# Patient Record
Sex: Male | Born: 1960
Health system: Southern US, Community
[De-identification: ages and names within clinical notes are randomized; demographics above are authoritative.]

## PROBLEM LIST (undated history)

## (undated) DIAGNOSIS — E669 Obesity, unspecified: Secondary | ICD-10-CM

## (undated) DIAGNOSIS — K7689 Other specified diseases of liver: Secondary | ICD-10-CM

## (undated) DIAGNOSIS — C189 Malignant neoplasm of colon, unspecified: Secondary | ICD-10-CM

## (undated) DIAGNOSIS — I639 Cerebral infarction, unspecified: Secondary | ICD-10-CM

## (undated) DIAGNOSIS — E781 Pure hyperglyceridemia: Secondary | ICD-10-CM

## (undated) DIAGNOSIS — Z85038 Personal history of other malignant neoplasm of large intestine: Secondary | ICD-10-CM

## (undated) DIAGNOSIS — L4050 Arthropathic psoriasis, unspecified: Secondary | ICD-10-CM

## (undated) DIAGNOSIS — M62830 Muscle spasm of back: Secondary | ICD-10-CM

## (undated) DIAGNOSIS — D649 Anemia, unspecified: Secondary | ICD-10-CM

## (undated) DIAGNOSIS — G4700 Insomnia, unspecified: Secondary | ICD-10-CM

## (undated) DIAGNOSIS — M79674 Pain in right toe(s): Secondary | ICD-10-CM

## (undated) DIAGNOSIS — I771 Stricture of artery: Secondary | ICD-10-CM

## (undated) DIAGNOSIS — I2510 Atherosclerotic heart disease of native coronary artery without angina pectoris: Secondary | ICD-10-CM

## (undated) DIAGNOSIS — N529 Male erectile dysfunction, unspecified: Secondary | ICD-10-CM

## (undated) DIAGNOSIS — I10 Essential (primary) hypertension: Secondary | ICD-10-CM

## (undated) DIAGNOSIS — N644 Mastodynia: Secondary | ICD-10-CM

## (undated) DIAGNOSIS — E349 Endocrine disorder, unspecified: Secondary | ICD-10-CM

## (undated) DIAGNOSIS — M25561 Pain in right knee: Secondary | ICD-10-CM

## (undated) DIAGNOSIS — R002 Palpitations: Secondary | ICD-10-CM

## (undated) DIAGNOSIS — E785 Hyperlipidemia, unspecified: Secondary | ICD-10-CM

## (undated) DIAGNOSIS — F418 Other specified anxiety disorders: Secondary | ICD-10-CM

## (undated) DIAGNOSIS — E1169 Type 2 diabetes mellitus with other specified complication: Secondary | ICD-10-CM

## (undated) DIAGNOSIS — L409 Psoriasis, unspecified: Secondary | ICD-10-CM

## (undated) DIAGNOSIS — E782 Mixed hyperlipidemia: Secondary | ICD-10-CM

## (undated) DIAGNOSIS — K219 Gastro-esophageal reflux disease without esophagitis: Secondary | ICD-10-CM

## (undated) DIAGNOSIS — R7989 Other specified abnormal findings of blood chemistry: Secondary | ICD-10-CM

## (undated) DIAGNOSIS — Z0000 Encounter for general adult medical examination without abnormal findings: Secondary | ICD-10-CM

## (undated) DIAGNOSIS — D509 Iron deficiency anemia, unspecified: Secondary | ICD-10-CM

## (undated) HISTORY — DX: Essential (primary) hypertension: I10

## (undated) HISTORY — DX: Gastro-esophageal reflux disease without esophagitis: K21.9

## (undated) HISTORY — DX: Iron deficiency anemia, unspecified: D50.9

## (undated) HISTORY — PX: COLONOSCOPY: SHX174

## (undated) HISTORY — DX: Pain in right toe(s): M79.674

## (undated) HISTORY — DX: Hyperlipidemia, unspecified: E78.5

## (undated) HISTORY — DX: Other specified anxiety disorders: F41.8

## (undated) HISTORY — PX: CARPAL TUNNEL RELEASE: SHX101

## (undated) HISTORY — DX: Mastodynia: N64.4

## (undated) HISTORY — DX: Endocrine disorder, unspecified: E34.9

## (undated) HISTORY — DX: Malignant neoplasm of colon, unspecified: C18.9

## (undated) HISTORY — DX: Palpitations: R00.2

## (undated) HISTORY — DX: Other specified abnormal findings of blood chemistry: R79.89

## (undated) HISTORY — DX: Anemia, unspecified: D64.9

## (undated) HISTORY — DX: Insomnia, unspecified: G47.00

## (undated) HISTORY — DX: Psoriasis, unspecified: L40.9

## (undated) HISTORY — DX: Stricture of artery: I77.1

## (undated) HISTORY — DX: Type 2 diabetes mellitus with other specified complication: E11.69

## (undated) HISTORY — DX: Cerebral infarction, unspecified: I63.9

## (undated) HISTORY — DX: Muscle spasm of back: M62.830

## (undated) HISTORY — DX: Other specified diseases of liver: K76.89

## (undated) HISTORY — PX: POLYPECTOMY: SHX149

## (undated) HISTORY — DX: Arthropathic psoriasis, unspecified: L40.50

## (undated) HISTORY — DX: Obesity, unspecified: E66.9

## (undated) HISTORY — DX: Male erectile dysfunction, unspecified: N52.9

## (undated) HISTORY — PX: HEMICOLECTOMY: SHX854

## (undated) HISTORY — DX: Pain in right knee: M25.561

## (undated) HISTORY — DX: Pure hyperglyceridemia: E78.1

## (undated) HISTORY — DX: Encounter for general adult medical examination without abnormal findings: Z00.00

## (undated) HISTORY — DX: Mixed hyperlipidemia: E78.2

## (undated) HISTORY — DX: Personal history of other malignant neoplasm of large intestine: Z85.038

---

## 1992-07-19 HISTORY — PX: CHOLECYSTECTOMY: SHX55

## 2001-09-13 ENCOUNTER — Encounter (INDEPENDENT_AMBULATORY_CARE_PROVIDER_SITE_OTHER): Payer: Self-pay | Admitting: *Deleted

## 2001-09-13 LAB — HM COLONOSCOPY: HM Colonoscopy: NORMAL

## 2008-02-16 ENCOUNTER — Ambulatory Visit: Payer: Self-pay | Admitting: Specialist

## 2008-02-29 ENCOUNTER — Ambulatory Visit: Payer: Self-pay | Admitting: Specialist

## 2008-03-29 ENCOUNTER — Encounter: Payer: Self-pay | Admitting: Internal Medicine

## 2008-12-03 ENCOUNTER — Encounter: Payer: Self-pay | Admitting: Internal Medicine

## 2009-07-19 DIAGNOSIS — D509 Iron deficiency anemia, unspecified: Secondary | ICD-10-CM

## 2009-07-19 HISTORY — DX: Iron deficiency anemia, unspecified: D50.9

## 2009-09-25 ENCOUNTER — Encounter: Payer: Self-pay | Admitting: Internal Medicine

## 2009-10-03 ENCOUNTER — Telehealth (INDEPENDENT_AMBULATORY_CARE_PROVIDER_SITE_OTHER): Payer: Self-pay | Admitting: *Deleted

## 2009-10-03 ENCOUNTER — Ambulatory Visit (HOSPITAL_COMMUNITY): Admission: RE | Admit: 2009-10-03 | Discharge: 2009-10-03 | Payer: Self-pay | Admitting: Internal Medicine

## 2009-10-03 ENCOUNTER — Ambulatory Visit: Payer: Self-pay | Admitting: Internal Medicine

## 2009-10-03 DIAGNOSIS — E669 Obesity, unspecified: Secondary | ICD-10-CM | POA: Insufficient documentation

## 2009-10-03 DIAGNOSIS — L409 Psoriasis, unspecified: Secondary | ICD-10-CM | POA: Insufficient documentation

## 2009-10-03 DIAGNOSIS — D649 Anemia, unspecified: Secondary | ICD-10-CM

## 2009-10-03 HISTORY — DX: Anemia, unspecified: D64.9

## 2009-10-03 LAB — CONVERTED CEMR LAB
ALT: 46 units/L (ref 0–53)
AST: 31 units/L (ref 0–37)
BUN: 13 mg/dL (ref 6–23)
Basophils Absolute: 0.1 10*3/uL (ref 0.0–0.1)
Basophils Relative: 2 % — ABNORMAL HIGH (ref 0–1)
Chloride: 103 meq/L (ref 96–112)
Creatinine, Ser: 0.99 mg/dL (ref 0.40–1.50)
Eosinophils Relative: 3 % (ref 0–5)
Glucose, Bld: 136 mg/dL — ABNORMAL HIGH (ref 70–99)
HCT: 29.3 % — ABNORMAL LOW (ref 39.0–52.0)
Hemoglobin: 8.5 g/dL — ABNORMAL LOW (ref 13.0–17.0)
Hgb A1c MFr Bld: 7.9 % — ABNORMAL HIGH (ref 4.6–6.1)
Iron: 21 ug/dL — ABNORMAL LOW (ref 42–165)
Lymphocytes Relative: 41 % (ref 12–46)
Lymphs Abs: 3 10*3/uL (ref 0.7–4.0)
Monocytes Absolute: 0.8 10*3/uL (ref 0.1–1.0)
Potassium: 4.6 meq/L (ref 3.5–5.3)
Saturation Ratios: 4 % — ABNORMAL LOW (ref 20–55)
TIBC: 528 ug/dL — ABNORMAL HIGH (ref 215–435)
TSH: 3.062 microintl units/mL (ref 0.350–4.500)
Total Bilirubin: 0.7 mg/dL (ref 0.3–1.2)

## 2009-10-05 ENCOUNTER — Telehealth: Payer: Self-pay | Admitting: Family Medicine

## 2009-10-05 ENCOUNTER — Emergency Department: Payer: Self-pay | Admitting: Internal Medicine

## 2009-10-05 ENCOUNTER — Inpatient Hospital Stay (HOSPITAL_COMMUNITY): Admission: EM | Admit: 2009-10-05 | Discharge: 2009-10-07 | Payer: Self-pay | Admitting: Internal Medicine

## 2009-10-06 ENCOUNTER — Ambulatory Visit: Payer: Self-pay | Admitting: Internal Medicine

## 2009-10-06 ENCOUNTER — Telehealth: Payer: Self-pay | Admitting: Internal Medicine

## 2009-10-07 ENCOUNTER — Encounter (INDEPENDENT_AMBULATORY_CARE_PROVIDER_SITE_OTHER): Payer: Self-pay | Admitting: *Deleted

## 2009-10-09 ENCOUNTER — Encounter: Payer: Self-pay | Admitting: Internal Medicine

## 2009-11-04 ENCOUNTER — Ambulatory Visit: Payer: Self-pay | Admitting: Internal Medicine

## 2009-11-04 DIAGNOSIS — K7689 Other specified diseases of liver: Secondary | ICD-10-CM

## 2009-11-04 DIAGNOSIS — K219 Gastro-esophageal reflux disease without esophagitis: Secondary | ICD-10-CM | POA: Insufficient documentation

## 2009-11-04 HISTORY — DX: Gastro-esophageal reflux disease without esophagitis: K21.9

## 2009-11-04 HISTORY — DX: Other specified diseases of liver: K76.89

## 2009-11-05 LAB — CONVERTED CEMR LAB
Basophils Relative: 0.9 % (ref 0.0–3.0)
Eosinophils Absolute: 0.2 10*3/uL (ref 0.0–0.7)
Eosinophils Relative: 2.9 % (ref 0.0–5.0)
Hemoglobin: 11.1 g/dL — ABNORMAL LOW (ref 13.0–17.0)
MCV: 75.7 fL — ABNORMAL LOW (ref 78.0–100.0)
Neutro Abs: 4.1 10*3/uL (ref 1.4–7.7)
RDW: 25.9 % — ABNORMAL HIGH (ref 11.5–14.6)

## 2009-12-03 ENCOUNTER — Ambulatory Visit: Payer: Self-pay | Admitting: Internal Medicine

## 2009-12-04 ENCOUNTER — Telehealth: Payer: Self-pay | Admitting: Internal Medicine

## 2009-12-05 ENCOUNTER — Encounter: Payer: Self-pay | Admitting: Internal Medicine

## 2009-12-08 ENCOUNTER — Telehealth (INDEPENDENT_AMBULATORY_CARE_PROVIDER_SITE_OTHER): Payer: Self-pay | Admitting: *Deleted

## 2009-12-12 ENCOUNTER — Encounter (INDEPENDENT_AMBULATORY_CARE_PROVIDER_SITE_OTHER): Payer: Self-pay | Admitting: *Deleted

## 2009-12-12 ENCOUNTER — Encounter (INDEPENDENT_AMBULATORY_CARE_PROVIDER_SITE_OTHER): Payer: Self-pay | Admitting: Surgery

## 2009-12-12 ENCOUNTER — Inpatient Hospital Stay (HOSPITAL_COMMUNITY)
Admission: RE | Admit: 2009-12-12 | Discharge: 2009-12-17 | Payer: Self-pay | Source: Home / Self Care | Admitting: Surgery

## 2009-12-25 ENCOUNTER — Ambulatory Visit: Payer: Self-pay | Admitting: Oncology

## 2009-12-25 ENCOUNTER — Encounter: Payer: Self-pay | Admitting: Internal Medicine

## 2010-01-02 ENCOUNTER — Telehealth: Payer: Self-pay | Admitting: Internal Medicine

## 2010-01-02 ENCOUNTER — Encounter: Payer: Self-pay | Admitting: Internal Medicine

## 2010-01-02 DIAGNOSIS — C189 Malignant neoplasm of colon, unspecified: Secondary | ICD-10-CM

## 2010-01-02 DIAGNOSIS — Z85038 Personal history of other malignant neoplasm of large intestine: Secondary | ICD-10-CM

## 2010-01-02 HISTORY — DX: Malignant neoplasm of colon, unspecified: C18.9

## 2010-01-02 HISTORY — DX: Personal history of other malignant neoplasm of large intestine: Z85.038

## 2010-01-02 LAB — CONVERTED CEMR LAB
Basophils Absolute: 0.1 10*3/uL (ref 0.0–0.1)
Basophils Relative: 1 % (ref 0–1)
Eosinophils Absolute: 1.1 10*3/uL — ABNORMAL HIGH (ref 0.0–0.7)
Eosinophils Relative: 13 % — ABNORMAL HIGH (ref 0–5)
MCHC: 30.8 g/dL (ref 30.0–36.0)
Monocytes Absolute: 0.9 10*3/uL (ref 0.1–1.0)
Neutrophils Relative %: 45 % (ref 43–77)
Platelets: 537 10*3/uL — ABNORMAL HIGH (ref 150–400)
RBC: 4.44 M/uL (ref 4.22–5.81)
RDW: 15.6 % — ABNORMAL HIGH (ref 11.5–15.5)
WBC: 8.4 10*3/uL (ref 4.0–10.5)

## 2010-01-05 ENCOUNTER — Telehealth: Payer: Self-pay | Admitting: Internal Medicine

## 2010-01-05 ENCOUNTER — Encounter: Payer: Self-pay | Admitting: Internal Medicine

## 2010-01-07 ENCOUNTER — Encounter: Payer: Self-pay | Admitting: Internal Medicine

## 2010-01-16 ENCOUNTER — Encounter: Payer: Self-pay | Admitting: Internal Medicine

## 2010-03-20 ENCOUNTER — Ambulatory Visit: Payer: Self-pay | Admitting: Internal Medicine

## 2010-03-20 DIAGNOSIS — G4700 Insomnia, unspecified: Secondary | ICD-10-CM | POA: Insufficient documentation

## 2010-03-20 DIAGNOSIS — E1169 Type 2 diabetes mellitus with other specified complication: Secondary | ICD-10-CM | POA: Insufficient documentation

## 2010-03-20 DIAGNOSIS — E669 Obesity, unspecified: Secondary | ICD-10-CM

## 2010-03-20 HISTORY — DX: Type 2 diabetes mellitus with other specified complication: E11.69

## 2010-03-20 HISTORY — DX: Type 2 diabetes mellitus with other specified complication: E66.9

## 2010-03-20 HISTORY — DX: Insomnia, unspecified: G47.00

## 2010-03-20 LAB — CONVERTED CEMR LAB: Blood Glucose, Fingerstick: 141

## 2010-03-24 ENCOUNTER — Encounter: Payer: Self-pay | Admitting: Internal Medicine

## 2010-03-24 LAB — CONVERTED CEMR LAB
Hgb A1c MFr Bld: 7.1 % — ABNORMAL HIGH (ref <5.7)
MCHC: 32.2 g/dL (ref 30.0–36.0)
MCV: 81.6 fL (ref 78.0–100.0)
RBC: 4.88 M/uL (ref 4.22–5.81)
WBC: 6 10*3/uL (ref 4.0–10.5)

## 2010-03-25 ENCOUNTER — Telehealth: Payer: Self-pay | Admitting: Internal Medicine

## 2010-08-18 NOTE — Progress Notes (Signed)
Summary: surgery  Phone Note Call from Patient Call back at [phone]   Caller: girlfriend Call For: Dr. Marina Goodell Reason for Call: Talk to Nurse Summary of Call: girlfriend would like the very first available surgeon appt for pt and would also like the appt late in the day... doesnt care which surgeon  Initial call taken by: Vallarie Mare,  Dec 04, 2009 8:07 AM  Follow-up for Phone Call        Pt's girlfriend [name]-[phone]) ntfd. that pt. has appt.  with Dr.Gross from [facility]. at the [place] office on Wed.-12/10/09 at 2  p.m. and he needs to be there at 1:30 p.m.  Follow-up by: Teryl Lucy RN,  Dec 04, 2009 1:41 PM

## 2010-08-18 NOTE — Progress Notes (Signed)
  Phone Note From Other Clinic   Caller: [facility] ED Summary of Call: Patient with recently dxed anemia.  Presented to [facility] after large volume bowel movement with blood.  Crampy abd pain and hgb 9.0 but patient hemodynamically stable.  Pt requesting transfer to [facility].  I have recommended that [facility] be contacted to accept transfer.   Initial call taken by: Evelena Peat MD,  October 05, 2009 2:48 PM

## 2010-08-18 NOTE — Consult Note (Signed)
Summary: [facility]  [facility]   Imported By: Lanelle Bal 10/15/2009 09:22:17  _____________________________________________________________________  External Attachment:    Type:   Image     Comment:   External Document

## 2010-08-18 NOTE — Letter (Signed)
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   If you need additional information, please feel free to contact our office.       Sincerely,    Mervin Kung CMA, AAMA Dr. Thomos Lemons

## 2010-08-18 NOTE — Progress Notes (Signed)
Summary: SURGERY SCHEDULED  Phone Note Other Incoming   Summary of Call: Girlfriend called to say [facility] moved pt's appt. to 12/08/09 with Dr.Rosenbower.Path. report faxed to his office.  Follow-up for Phone Call        Bernie at [facility] says pt. saw Dr.Gross at [facility] and he is scheduled for surgery with Dr.Wakefield  and Dr.Tseui. Follow-up by: Teryl Lucy RN,  Dec 08, 2009 2:42 PM

## 2010-08-18 NOTE — Letter (Signed)
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         Sincerely,    Glendell Docker CMA Dr Thomos Lemons

## 2010-08-18 NOTE — Letter (Signed)
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  If you need additional information, please feel free to contact our office.         Sincerely,    Mervin Kung CMA Thomos Lemons, DO

## 2010-08-18 NOTE — Consult Note (Signed)
Summary: [facility]  [facility]   Imported By: Lanelle Bal 01/14/2010 08:27:53  _____________________________________________________________________  External Attachment:    Type:   Image     Comment:   External Document

## 2010-08-18 NOTE — Progress Notes (Signed)
Summary: in  the hospital   Phone Note Call from Patient   Caller: patient girlfriend tony Call For: yoo  Summary of Call: patient has been admitted to [facility].  Had a ct that showed apparent inflammatory changes involving the right proximal colon suggested of focal colitis or right sided diverticulitis.  He has hepatic steaosis and lobulated soft tissue nodule in the omentum. He is concerned as he has only seen a hospital doctor and has not had a GI referral called in as yet.  He was to see GI at their office today but since he is in the hospital that appt was cancelled.  Anything you can do to get a GI consult for him at [facility].  [phone]    Initial call taken by: Roselle Locus,  October 06, 2009 2:44 PM  Follow-up for Phone Call        that is for hospital doctor to decide.  If he is not seen by GI in the hospital,  we  will arrange GI consult as out pt.  hospitalist note mentioned they consulted GI.   Please call [facility] and find out who is on call Follow-up by: D. Thomos Lemons DO,  October 06, 2009 4:30 PM  Additional Follow-up for Phone Call Additional follow up Details #1::        spoke with Neysa Bonito at [facility], she states Maralyn Sago is covering [facility]. Pager  [phone] Additional Follow-up by: Glendell Docker CMA,  October 06, 2009 4:43 PM    Additional Follow-up for Phone Call Additional follow up Details #2::    Spoke with Maralyn Sago she states patient was seen for consult about an hour ago Follow-up by: Glendell Docker CMA,  October 06, 2009 4:54 PM

## 2010-08-18 NOTE — Letter (Signed)
Summary: [facility]   Imported By: Sherian Rein 01/08/2010 08:49:30  _____________________________________________________________________  External Attachment:    Type:   Image     Comment:   External Document

## 2010-08-18 NOTE — Letter (Signed)
Summary: New Patient letter  [facility]  [address] [place], [place] [address]   Phone: [phone]  Fax: [phone]       10/07/2009 MRN: [id number]  [name] [address] [place], [place]  [address]  Dear Mr. Carachure,  Welcome to the Gastroenterology Division at [facility].    You are scheduled to see Dr.  Yancey Flemings on November 04, 2009 at 3:30pm on the 3rd floor at [facility], [address]  We ask that you try to arrive at our office 15 minutes prior to your appointment time to allow for check-in.  We would like you to complete the enclosed self-administered evaluation form prior to your visit and bring it with you on the day of your appointment.  We will review it with you.  Also, please bring a complete list of all your medications or, if you prefer, bring the medication bottles and we will list them.  Please bring your insurance card so that we may make a copy of it.  If your insurance requires a referral to see a specialist, please bring your referral form from your primary care physician.  Co-payments are due at the time of your visit and may be paid by cash, check or credit card.     Your office visit will consist of a consult with your physician (includes a physical exam), any laboratory testing he/she may order, scheduling of any necessary diagnostic testing (e.g. x-ray, ultrasound, CT-scan), and scheduling of a procedure (e.g. Endoscopy, Colonoscopy) if required.  Please allow enough time on your schedule to allow for any/all of these possibilities.    If you cannot keep your appointment, please call [phone] to cancel or reschedule prior to your appointment date.  This allows [place] the opportunity to schedule an appointment for another patient in need of care.  If you do not cancel or reschedule by 5 p.m. the business day prior to your appointment date, you will be charged a $50.00 late cancellation/no-show fee.    Thank you for  choosing [facility] for your medical needs.  We appreciate the opportunity to care for you.  Please visit [place] at our website  to learn more about our practice.                     Sincerely,                                                             The Gastroenterology Division

## 2010-08-18 NOTE — Procedures (Signed)
Summary: Upper Endoscopy  Patient: [name] Note: All result statuses are Final unless otherwise noted.  Tests: (1) Upper Endoscopy (EGD)   EGD Upper Endoscopy       DONE     [facility]     [address]     [place], [place]  [address]           ENDOSCOPY PROCEDURE REPORT           PATIENT:  Frank, Duke  MR#:  [id number]     BIRTHDATE:  02-Nov-1960, 49 yrs. old  GENDER:  male           ENDOSCOPIST:  Wilhemina Bonito. Eda Keys, MD     Referred by:  Thomos Lemons, DO           PROCEDURE DATE:  12/03/2009     PROCEDURE:  EGD, diagnostic     ASA CLASS:  Class II     INDICATIONS:  GERD, iron deficiency anemia           MEDICATIONS:   There was residual sedation effect present from     prior procedure., Versed 1 mg IV     TOPICAL ANESTHETIC:  Exactacain Spray           DESCRIPTION OF PROCEDURE:   After the risks benefits and     alternatives of the procedure were thoroughly explained, informed     consent was obtained.  The LB GIF-H180 [id number] endoscope was     introduced through the mouth and advanced to the second portion of     the duodenum, without limitations.  The instrument was slowly     withdrawn as the mucosa was fully examined.     <<PROCEDUREIMAGES>>           Reflux Esophagitis was found in the distal esophagus with     stricture formation as well. No Barrett's.  Otherwise the     examination was normal to D2.    Retroflexed views revealed a     hiatal hernia.    The scope was then withdrawn from the patient     and the procedure completed.           COMPLICATIONS:  None           ENDOSCOPIC IMPRESSION:     1) Esophagitis in the distal esophagus     2) Stricture in the distal esophagus     3) Otherwise normal examination     4) A hiatal hernia     5) Gerd           RECOMMENDATIONS:     1) continue Omeprazole twice daily     2) Reflux precautions     3) Call if swallowing problems develop           ______________________________     Wilhemina Bonito. Eda Keys, MD           CC:  Thomos Lemons, DO, The Patient           n.     eSIGNED:   Wilhemina Bonito. Eda Keys at 12/03/2009 04:47 PM           Carin Hock, [id number]  Note: An exclamation mark (!) indicates a result that was not dispersed into the flowsheet. Document Creation Date: 12/04/2009 5:07 PM _______________________________________________________________________  (1) Order result status: Final Collection or observation date-time: 12/03/2009 16:41 Requested date-time:  Receipt date-time:  Reported date-time:  Referring Physician:  Ordering Physician: Fransico Setters [phone]) Specimen Source:  Source: Launa Grill Order Number: [id number] Lab site:

## 2010-08-18 NOTE — Assessment & Plan Note (Signed)
Summary: Iron deficiency anemia / recent hospitalization   History of Present Illness Visit Type: Initial Visit Primary GI MD: Yancey Flemings MD Primary Provider: Dondra Spry DO Requesting Provider: n/a Chief Complaint: Anemia, also c/o abdominal pain History of Present Illness:   50 year old white male with obesity, psoriasis, GERD, and iron deficiency anemia. Patient relocated to [facility] from [place]. He recently established with Dr. Thomos Lemons and was found to have iron deficiency anemia and Hemoccult-positive stool. His hemoglobin at that time was 8.5. He was placed on iron and outpatient GI consultation arranged. However, prior to his GI appointment, he was admitted to the hospital with acute abdominal pain and rectal bleeding. CT Scan of the abdomen and pelvis revealed inflammatory changes involving the proximal transverse colon and right colon. He was seen in consultation by GI and felt to have ischemic colitis, though atypical distribution. As well, hepatic steatosis on CT. He was treated conservatively and discharged home. He has been maintained on iron therapy and returns for outpatient followup. Overall he is doing well. Improved energy level. Some vague left upper quadrant abdominal discomfort, but otherwise okay. Bowel habits okay. No gross bleeding. He is anxious. No follow up blood work since his hospital stay. Denies NSAIDs   GI Review of Systems    Reports abdominal pain.      Denies acid reflux, belching, bloating, chest pain, dysphagia with liquids, dysphagia with solids, heartburn, loss of appetite, nausea, vomiting, vomiting blood, weight loss, and  weight gain.      Reports rectal bleeding.     Denies anal fissure, black tarry stools, change in bowel habit, constipation, diarrhea, diverticulosis, fecal incontinence, heme positive stool, hemorrhoids, irritable bowel syndrome, jaundice, light color stool, liver problems, and  rectal pain. Preventive  Screening-Counseling & Management      Drug Use:  no.      Current Medications (verified): 1)  Enbrel 50 Mg/ml Soln (Etanercept) .... Inject Im Twice Per Week 2)  Clobetasol Propionate 0.05 % Crea (Clobetasol Propionate) .... Apply Topically Once Daily As Needed 3)  Omeprazole 40 Mg Cpdr (Omeprazole) .... One By Mouth Two Times A Day 15 Minutes Before Meal 4)  Ferrex 150 Forte 150-25-1 Mg-Mcg-Mg Caps (Iron Polysacch Cmplx-B12-Fa) .... One By Mouth Once Daily  Allergies (verified): No Known Drug Allergies  Past History:  Past Medical History: Obesity Anemia  2011  Psoriasis Hepatic Steatosis  Past Surgical History: Cholecystectomy  1994  Family History: Reviewed history from 10/03/2009 and no changes required. mother died of cancer in her 67's- stomach cancer  No CAD older brother has borderline DM no prostate no colon cancer   Social History: Reviewed history from 10/03/2009 and no changes required. Occupation: Pensions consultant  Single   no children former smoker Illicit Drug Use - no Drug Use:  no  Review of Systems       The patient complains of fatigue and shortness of breath.  The patient denies allergy/sinus, anemia, anxiety-new, arthritis/joint pain, back pain, blood in urine, breast changes/lumps, change in vision, confusion, cough, coughing up blood, depression-new, fainting, fever, headaches-new, hearing problems, heart murmur, heart rhythm changes, itching, muscle pains/cramps, night sweats, nosebleeds, skin rash, sleeping problems, sore throat, swelling of feet/legs, swollen lymph glands, thirst - excessive, urination - excessive, urination changes/pain, urine leakage, vision changes, and voice change.    Vital Signs:  Patient profile:   50 year old male Height:      69 inches Weight:  244 pounds BMI:     36.16 BSA:     2.25 Pulse rate:   100 / minute Pulse rhythm:   regular BP sitting:   138 / 82  (left arm)  Vitals Entered By: Merri Ray CMA  Duncan Dull) (November 04, 2009 3:31 PM)  Physical Exam  General:  Well developed, well nourished, no acute distress. Head:  Normocephalic and atraumatic. Eyes:  PERRLA, no icterus. Ears:  Normal auditory acuity. Nose:  No deformity, discharge,  or lesions. Mouth:  No deformity or lesions, dentition normal. Neck:  Supple; no masses or thyromegaly. Lungs:  Clear throughout to auscultation. Heart:  Regular rate and rhythm; no murmurs, rubs,  or bruits. Abdomen:  Soft, nontender and nondistended. No masses, hepatosplenomegaly or hernias noted. Normal bowel sounds. Rectal:  deferred until colonoscopy Msk:  Symmetrical with no gross deformities. Normal posture. Pulses:  Normal pulses noted. Extremities:  No clubbing, cyanosis, edema or deformities noted. Neurologic:  Alert and  oriented x4;  grossly normal neurologically. Skin:  psoriatic plaques on the elbows, abdomen, and knees  Psych:  Alert and cooperative. Normal mood and affect.   Impression & Recommendations:  Problem # 1:  ANEMIA-IRON DEFICIENCY (ICD-280.9) iron deficiency anemia diagnosed several months ago. As well Hemoccult-positive stool. Rule out occult GI lesion.  Plan: #1. Colonoscopy. The nature of the procedure as well as the risks, benefits, and alternatives were reviewed in detail. He understood and agreed to proceed #2. Upper endoscopy. The nature of the procedure as well as the risks, benefits, and alternatives were reviewed. He understood and agreed to proceed #3. Movi prep prescribed. The patient instructed on its use. #4. Obtain followup CBC today #5. Continue iron therapy  Problem # 2:  FECAL OCCULT BLOOD (ICD-792.1) rule out significant mucosal lesion, particularly given iron deficiency. Workup as planned above  Problem # 3:  NONSPECIFIC ABN FINDING RAD & OTH EXAM GI TRACT (ICD-793.4) CT scan suggesting right-sided colitis, likely ischemic.  Plan: #1. Clarify with colonoscopy  Problem # 4:  FATTY LIVER DISEASE  (ICD-571.8) fatty liver disease on CT  Plan: #1 sensible weight loss #2 sensible exercise  Problem # 5:  GERD (ICD-530.81) history of GERD. Symptoms controlled with PPI   Plan  #1 continue omeprazole #2. Reflux precautions with attention to weight loss  Other Orders: Colon/Endo (Colon/Endo) TLB-CBC Platelet - w/Differential (85025-CBCD)  Patient Instructions: 1)  Labs ordered for patient to have drawn today. 2)  Endo/Colon [facility] 12/03/09 3:30 pm arrive at 2:30 pm 3)  Movi prep instructions given to patient. 4)  Movi prep Rx. sent to your pharmacy to pick up. 5)  The medication list was reviewed and reconciled.  All changed / newly prescribed medications were explained.  A complete medication list was provided to the patient / caregiver. 6)  Colonoscopy and Flexible Sigmoidoscopy brochure given.  7)  Upper Endoscopy brochure given.  8)  printed and given to patient. Milford Cage [facility]  November 04, 2009 4:32 PM 9)  copy: Dr. Thomos Lemons Prescriptions: MOVIPREP 100 GM  SOLR (PEG-KCL-NACL-NASULF-NA ASC-C) As per prep instructions.  #1 x 0   Entered by:   Milford Cage NCMA   Authorized by:   Hilarie Fredrickson MD   Signed by:   Milford Cage NCMA on 11/04/2009   Method used:   Electronically to        [facility]. [address] [phone]* (retail)       [address]  [place], [place]  [address]       Ph: [phone]       Fax: [phone]   RxID:   [id number]

## 2010-08-18 NOTE — Letter (Signed)
Summary: Screening Form/[facility]  Screening Form/[facility]   Imported By: Sherian Rein 04/01/2010 13:23:30  _____________________________________________________________________  External Attachment:    Type:   Image     Comment:   External Document

## 2010-08-18 NOTE — Progress Notes (Signed)
Summary: work restriction  Phone Note Call from Patient Call back at [phone]   Caller: Patient Call For: D. Thomos Lemons DO Summary of Call: Pt called stating that his company has a guideline that if a pt. can't lift at least 25 pounds they cannot work.  Pt is requesting a new letter stating that he can lift no more than 30 pounds when he returns to work on 01/21/10. If you feel pt is unable to lift this amount he will need a letter to the [facility] (short term disability) group explaining why pt will not be able to return to work on the expected date.  Pt is aware that it will be Monday before we will have any information for him concerning this request.  Mervin Kung CMA  January 02, 2010 11:57 AM   Follow-up for Phone Call        ok to change letter to no lifting > 30 lbs Follow-up by: D. Thomos Lemons DO,  January 02, 2010 1:03 PM  Additional Follow-up for Phone Call Additional follow up Details #1::        Left message on machine to return my call.  Corrected letter has been placed at the front desk for pick up. Mervin Kung CMA  January 02, 2010 2:07 PM

## 2010-08-18 NOTE — Progress Notes (Signed)
Summary: Appt. ASAP  Phone Note From Other Clinic   Caller: [facility] [phone] Summary of Call: Needs an appt. ASAP. Pt is going for iron infusion today due to iron def. Initial call taken by: Karna Christmas,  October 03, 2009 11:45 AM  Follow-up for Phone Call        Pt. will see Dr.Kaplan on 10-06-09 at 2:30pm. Myriam Jacobson will advise pt. of appt/medlist/co-pay. Follow-up by: Laureen Ochs LPN,  October 03, 2009 11:54 AM

## 2010-08-18 NOTE — Progress Notes (Signed)
Summary: Lab Results  Phone Note Outgoing Call   Summary of Call: call pt - blood work shows pt still anemic.  advise pt to keep taking iron supplement.  I suggest repeat CBCD in 2 months. Initial call taken by: D. Thomos Lemons DO,  January 05, 2010 12:18 PM  Follow-up for Phone Call        Pt would like to also get his work note changed from 10 pounds to 30. Pls call him at [phone] Diane Tomerlin  January 05, 2010 3:59 PM  Additional Follow-up for Phone Call Additional follow up Details #1::        patient advised of lab results, and a copy of his work note from 6/17 has been reprinted and left at front desk for patient pick up. patient wil return in August for blood work in [employer] Additional Follow-up by: Glendell Docker CMA,  January 05, 2010 5:24 PM

## 2010-08-18 NOTE — Letter (Signed)
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          Sincerely,    Glendell Docker CMA Dr. Thomos Lemons

## 2010-08-18 NOTE — Progress Notes (Signed)
Summary: Lab results  Phone Note Outgoing Call   Summary of Call: call pt - blood test confirms pt is diabetic.  A1c 7.1.   I suggest pt start diet and exercise for next 2-3 months. exercise 30-45 mins daily.  limit carbs to 25 grams per meal.  I also recommend using metformin.  see rx anemia is better -  hemoglobin in 12.8 cholesterol panel shows elevated triglycerides.  this is likely assoc with diabetes and should improve with diet and exercise Pt needs f/u appt within 2 months.  arrange FLP, A1c and BMET before OV  Initial call taken by: D. Thomos Lemons DO,  March 25, 2010 11:43 AM  Follow-up for Phone Call        patient has been advised per Dr Artist Pais instructions. When patient offered a follow up appointment, he states he is unable to schedule because he has no time to take off at work. He will try to schedule around his day off and call to check for availability on  day to day basis Follow-up by: Glendell Docker CMA,  March 26, 2010 1:30 PM    New/Updated Medications: METFORMIN HCL 500 MG TABS (METFORMIN HCL) 1/2 by mouth two times a day with food x 1 week, then one by mouth bid Prescriptions: METFORMIN HCL 500 MG TABS (METFORMIN HCL) 1/2 by mouth two times a day with food x 1 week, then one by mouth bid  #60 x 2   Entered and Authorized by:   D. Thomos Lemons DO   Signed by:   D. Thomos Lemons DO on 03/25/2010   Method used:   Electronically to        [facility]. [address] [phone]* (retail)       [address], [place]  [address]       Ph: [phone]       Fax: [phone]   RxID:   [id number]

## 2010-08-18 NOTE — Letter (Signed)
[facility]   Im[facility] By: Sherian Rein 02/04/2010 12:23:24  _____________________________________________________________________  External Attachment:    Type:   Image     Comment:   External Document

## 2010-08-18 NOTE — Letter (Signed)
[facility]   Imported By: Lanelle Bal 01/22/2010 12:07:00  _____________________________________________________________________  External Attachment:    Type:   Image     Comment:   External Document

## 2010-08-18 NOTE — Consult Note (Signed)
Summary: [facility]   Imported By: Sherian Rein 01/08/2010 08:51:45  _____________________________________________________________________  External Attachment:    Type:   Image     Comment:   External Document

## 2010-08-18 NOTE — Assessment & Plan Note (Signed)
Summary: fu Dr Lamar Sprinkles appt/dt   Vital Signs:  Patient profile:   50 year old male Height:      69 inches Weight:      228 pounds BMI:     33.79 Temp:     97.8 degrees F oral Pulse rate:   88 / minute Pulse rhythm:   regular Resp:     18 per minute BP sitting:   120 / 80  (left arm) Cuff size:   large  Vitals Entered By: Glendell Docker CMA (January 02, 2010 8:19 AM) CC: Rm 2- Follow up   Primary Care Provider:  Dondra Spry DO  CC:  Rm 2- Follow up.  History of Present Illness: 50 y/o white male for f/u microcytic anemia - pt found to have right colon cancer s/p right hemicolectomy healed well.  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  no would deheisence  oncologist -  appt on Monday his psoriasis is worse since holding enbrel he is concerned taking enbrel may have contributed to colon cancer  Preventive Screening-Counseling & Management  Alcohol-Tobacco     Smoking Status: quit  Allergies: No Known Drug Allergies  Past History:  Past Medical History: Obesity Iron def Anemia  2011   Psoriasis Hepatic Steatosis right colon cancer - adenocarcinoma CEA level is normal at 1.8.  Past Surgical History: Cholecystectomy  1994 Right hemicolectomy 08-Jan-2010  Family History: mother died of cancer in her 26's- stomach cancer  No CAD older brother has borderline DM no prostate no colon cancer      Social History: Occupation: Pensions consultant (Conservator, museum/gallery) Single   no children  former smoker Illicit Drug Use - no    Physical Exam  General:  alert, well-developed, and well-nourished.   Head:  normocephalic and atraumatic.   Lungs:  normal respiratory effort and normal breath sounds.   Heart:  normal rate, regular rhythm, and no gallop.   Abdomen:  soft and mild abd tenderness.   abd scar well healed Neurologic:  cranial nerves II-XII  intact and gait normal.     Impression & Recommendations:  Problem # 1:  ADENOCARCINOMA, COLON (ICD-153.9) Pt will check with his oncologist whether he can safely restart Enbrel pt advised to avoid heavy lifting for additional 2-4 weeks pt at risk for incisional hernia  Problem # 2:  ANEMIA-IRON DEFICIENCY (ICD-280.9)  His updated medication list for this problem includes:    Ferrex 150 Forte 150-25-1 Mg-mcg-mg Caps (Iron polysacch cmplx-b12-fa) ..... One by mouth once daily  His updated medication list for this problem includes:    Ferrex 150 Forte 150-25-1 Mg-mcg-mg Caps (Iron polysacch cmplx-b12-fa) ..... One by mouth once daily  Orders: T-CBC w/Diff ([id number])  Complete Medication List: 1)  Enbrel 50 Mg/ml Soln (Etanercept) .... Inject im twice per week (hold) 2)  Clobetasol Propionate 0.05 % Crea (Clobetasol propionate) .... Apply topically once daily as needed 3)  Omeprazole 40 Mg Cpdr (Omeprazole) .... One by mouth two times a day 15 minutes before meal 4)  Ferrex 150 Forte 150-25-1 Mg-mcg-mg Caps (Iron polysacch cmplx-b12-fa) .... One by mouth once daily  Patient Instructions: 1)  Please schedule a follow-up appointment in 2 months.

## 2010-08-18 NOTE — Assessment & Plan Note (Signed)
Summary: paper work/lab per md/lb   Vital Signs:  Patient profile:   50 year old male Height:      69 inches Weight:      235.50 pounds BMI:     34.90 O2 Sat:      98 % on Room air Temp:     98.3 degrees F oral Pulse rate:   86 / minute Pulse rhythm:   regular Resp:     18 per minute BP sitting:   120 / 76  (right arm) Cuff size:   large  Vitals Entered By: Glendell Docker CMA (March 20, 2010 2:08 PM)  O2 Flow:  Room air CC: follow up Is Patient Diabetic? No Pain Assessment Patient in pain? no      CBG Result 141 Comments need paper work completed for work, did not  have CBC drawn   Primary Care Provider:  D. Thomos Lemons DO  CC:  follow up.  History of Present Illness: 50 y/o with of microcystic anemia, colon ca s/p hemicolectomy for f/u overall doing well back to work - busy  denies fatigue no melena  we reviewed prev labs A1c elevated  he denies polyuria or polydysia wife is diabetic.  he follows fairly healthy diet  Preventive Screening-Counseling & Management  Alcohol-Tobacco     Smoking Status: quit  Allergies (verified): No Known Drug Allergies  Past History:  Past Medical History: Obesity Iron def Anemia  2011   Psoriasis  Hepatic Steatosis right colon cancer - adenocarcinoma CEA level is normal at 1.8.  Past Surgical History: Cholecystectomy  1994 Right hemicolectomy 12/17/2009   Family History: mother died of cancer in her 22's- stomach cancer  No CAD older brother has  DM II no prostate no colon cancer      Social History: Occupation: Pensions consultant (Conservator, museum/gallery) Single  (lives with [name]) no children  former smoker Illicit Drug Use - no    Physical Exam  General:  alert, well-developed, and well-nourished.   Lungs:  normal respiratory effort and normal breath sounds.   Abdomen:  soft, non-tender, and normal bowel sounds.   Skin:  turgor normal and color normal.     Impression & Recommendations:  Problem # 1:   HYPERGLYCEMIA (ICD-790.29)  Pt counseled on diet and exercise.  Orders: T- Hemoglobin A1C (04540-98119) T-Lipid Profile (14782-95621) Fingerstick (30865)  His updated medication list for this problem includes:    Metformin Hcl 500 Mg Tabs (Metformin hcl) .Marland Kitchen... 1/2 by mouth two times a day with food x 1 week, then one by mouth bid  Problem # 2:  INSOMNIA, CHRONIC (ICD-307.42) chronic intermittent insomnia.  we discussed dependency issues with sedatives.  trial of low dose amitriptyline  Problem # 3:  ANEMIA, IRON DEFICIENCY, MICROCYTIC (ICD-280.8)  His updated medication list for this problem includes:    Ferrex 150 Forte 150-25-1 Mg-mcg-mg Caps (Iron polysacch cmplx-b12-fa) ..... One by mouth once daily  Orders: T-CBC No Diff ([id number])  Complete Medication List: 1)  Enbrel 50 Mg/ml Soln (Etanercept) .... Inject im twice per week (hold) 2)  Clobetasol Propionate 0.05 % Crea (Clobetasol propionate) .... Apply topically once daily as needed 3)  Omeprazole 40 Mg Cpdr (Omeprazole) .... One by mouth two times a day 15 minutes before meal 4)  Ferrex 150 Forte 150-25-1 Mg-mcg-mg Caps (Iron polysacch cmplx-b12-fa) .... One by mouth once daily 5)  Amitriptyline Hcl 10 Mg Tabs (Amitriptyline hcl) .... One to two tabs by mouth at bedtime as needed  for insomnia 6)  Onetouch Ultra Test Strp (Glucose blood) .... Test once daily 7)  Metformin Hcl 500 Mg Tabs (Metformin hcl) .... 1/2 by mouth two times a day with food x 1 week, then one by mouth bid  Other Orders: Influenza Vaccine NON MCR ([address]) Admin 1st Vaccine ([address])  Patient Instructions: 1)  Please schedule a follow-up appointment in 3 months. 2)  Limit your carbohydrate intake to 25-30 grams per meal. 3)  BMP prior to visit, ICD-9: 790.29 4)  HbgA1C prior to visit, ICD-9: 790.29 5)  Please return for lab work one (1) week before your next appointment.  Prescriptions: OMEPRAZOLE 40 MG CPDR (OMEPRAZOLE) one by mouth two times a  day 15 minutes before meal  #60 x 3   Entered and Authorized by:   D. Thomos Lemons DO   Signed by:   D. Thomos Lemons DO on 03/20/2010   Method used:   Electronically to        [facility]. [address] [phone]* (retail)       [address] [place], [place]  [address]       Ph: [phone]       Fax: [phone]   RxID:   [id number] AMITRIPTYLINE HCL 10 MG TABS (AMITRIPTYLINE HCL) one to two tabs by mouth at bedtime as needed for insomnia  #60 x 2   Entered and Authorized by:   D. Thomos Lemons DO   Signed by:   D. Thomos Lemons DO on 03/20/2010   Method used:   Electronically to        [facility]. [address] [phone]* (retail)       [address] [place], [place]  [address]       Ph: [phone]       Fax: [phone]   RxID:   [id number]   Current Allergies (reviewed today): No known allergies    Immunizations Administered:  Influenza Vaccine # 1:    Vaccine Type: Fluvax Non-MCR    Site: left deltoid    Mfr: GlaxoSmithKline    Dose: 0.5 ml    Route: IM    Given by: Glendell Docker CMA    Exp. Date: 01/16/2011    Lot #: [id number]    VIS given: 02/10/10 version given March 20, 2010.  Flu Vaccine Consent Questions:    Do you have a history of severe allergic reactions to this vaccine? no    Any prior history of allergic reactions to egg and/or gelatin? no    Do you have a sensitivity to the preservative Thimersol? no    Do you have a past history of Guillan-Barre Syndrome? no    Do you currently have an acute febrile illness? no    Have you ever had a severe reaction to latex? no    Vaccine information given and explained to patient? yes   Laboratory Results   Blood Tests     CBG Random:: 141mg /dL

## 2010-08-18 NOTE — Procedures (Signed)
Summary: Colonoscopy  Patient: [name] Note: All result statuses are Final unless otherwise noted.  Tests: (1) Colonoscopy (COL)   COL Colonoscopy           DONE     [facility]     [address]     [place], [place]  [address]           COLONOSCOPY PROCEDURE REPORT           PATIENT:  [name], [name]  MR#:  [id number]     BIRTHDATE:  Jan 25, 1961, 49 yrs. old  GENDER:  male     ENDOSCOPIST:  Wilhemina Bonito. Eda Keys, MD     REF. BY:  Thomos Lemons, DO     PROCEDURE DATE:  12/03/2009     PROCEDURE:  Colonoscopy with biopsy,     Colonoscopy with snare polypectomy     x 2     ASA CLASS:  Class II     INDICATIONS:  Iron Deficiency Anemia, abnormal CT of abdomen, FOBT     positive stool     MEDICATIONS:   Fentanyl 100 mcg IV, Versed 11 mg IV, Benadryl 25     mg IV           DESCRIPTION OF PROCEDURE:   After the risks benefits and     alternatives of the procedure were thoroughly explained, informed     consent was obtained.  Digital rectal exam was performed and     revealed no abnormalities.   The LB [id number] endoscope     was introduced through the anus and advanced to the ascending     colon, limited by an obstruction.    The quality of the prep was     excellent, using MoviPrep.  The instrument was then slowly     withdrawn as the colon was fully examined.     <<PROCEDUREIMAGES>>           FINDINGS:  A subtotally obstructing malignant appearring mass was     found in the ascending colon. The scope could not be passed     proximal. Multiple biopsies taken . Two polyps were found in the     mid transverse colon (10mm pedunculated) and diminutive descending     colon polyp.. Polyps were snared without cautery. Retrieval was     successful.   Mild diverticulosis was found in the sigmoid colon.     Retroflexed views in the rectum revealed no abnormalities.    The     scope was then withdrawn from the patient and the procedure     completed.        COMPLICATIONS:  None     ENDOSCOPIC IMPRESSION:     1) Mass in the ascending colon     2) Two polyps - removed     3) Mild diverticulosis in the sigmoid colon           RECOMMENDATIONS:     1) My office will arrange for you to meet with a surgeon.     2) Follow up colonoscopy in ONE year           ______________________________     Wilhemina Bonito. Eda Keys, MD           CC:  Thomos Lemons, DO; The Patient           n.     eSIGNED:   Teleah Villamar N. Eda Keys at 12/03/2009 04:37 PM  Parks, Czaj[place], [phone]  Note: An exclamation mark (!) indicates a result that was not dispersed into the flowsheet. Document Creation Date: 12/04/2009 5:07 PM _______________________________________________________________________  (1) Order result status: Final Collection or observation date-time: 12/03/2009 16:25 Requested date-time:  Receipt date-time:  Reported date-time:  Referring Physician:   Ordering Physician: Fransico Setters [phone]) Specimen Source:  Source: Launa Grill Order Number: [id number] Lab site:

## 2010-08-18 NOTE — Letter (Signed)
Summary: [facility]   Imported By: Lanelle Bal 01/14/2010 08:27:03  _____________________________________________________________________  External Attachment:    Type:   Image     Comment:   External Document

## 2010-08-18 NOTE — Assessment & Plan Note (Signed)
Summary: NEW TO [facility] PATIENT CALLED AND WAS   Vital Signs:  Patient profile:   50 year old male Height:      69 inches Weight:      239.50 pounds BMI:     35.50 O2 Sat:      98 % on Room air Temp:     98.0 degrees F Pulse rate:   87 / minute Pulse rhythm:   regular Resp:     20 per minute BP sitting:   120 / 78  (right arm) Cuff size:   large  Vitals Entered By: Glendell Docker CMA (October 03, 2009 10:05 AM)  O2 Flow:  Room air CC: New Patient   Primary Care Provider:  Dondra Spry DO  CC:  New Patient.  History of Present Illness: 50 y/o white male to establish.   He was seen at urgent care for paronychia.  Incidentally, pt found to have significant microcytic anemia.    he has associated fatigue and dyspnea on exertion.  He recalls going on a hike in November getting  very winded.    He notes tongue has been red and irritated for last 8 months.  he denies chronic NSAID or aspirin use.   he has not noticed melena or hematochezia he denies chronic abd pain  Preventive Screening-Counseling & Management  Alcohol-Tobacco     Alcohol drinks/day: 0     Smoking Status: quit     Packs/Day: <0.25     Year Started: 1982     Year Quit: 2009  Caffeine-Diet-Exercise     Caffeine use/day: None     Does Patient Exercise: no  Allergies (verified): No Known Drug Allergies  Past History:  Past Medical History: Obesity  Family History: mother died of cancer in her 13's- stomach cancer  No CAD older brother has borderline DM no prostate no colon cancer   Social History: Occupation: Pensions consultant Single   no children former smokerSmoking Status:  quit Packs/Day:  <0.25 Caffeine use/day:  None Does Patient Exercise:  no  Review of Systems       The patient complains of dyspnea on exertion.  The patient denies anorexia, weight loss, weight gain, prolonged cough, abdominal pain, melena, hematochezia, severe indigestion/heartburn, and depression.    Physical  Exam  General:  alert and overweight-appearing.   Head:  normocephalic and atraumatic.   Eyes:  pupils equal, pupils round, and pupils reactive to light.   Ears:  R ear normal and L ear normal.   Mouth:  tongue is slightly enlarged,  "beefy" in appearance Neck:  No deformities, masses, or tenderness noted.no carotid bruits.   Lungs:  normal respiratory effort, normal breath sounds, no crackles, and no wheezes.   Heart:  normal rate, regular rhythm, no murmur, and no gallop.   Abdomen:  soft, non-tender, normal bowel sounds, no masses, no hepatomegaly, and no splenomegaly.   Rectal:  normal sphincter tone and stool positive for occult blood.   Extremities:  No lower extremity edema  Neurologic:  cranial nerves II-XII intact and gait normal.   Skin:  plaques on forearms   Impression & Recommendations:  Problem # 1:  ANEMIA, IRON DEFICIENCY, MICROCYTIC (ICD-280.8) 50 y/o white male with significant iron def anemia.  I suspect slow GI loss for several months.  Check iron stores.  Give IV iron and start oral iron supplementation.   refer to GI for EGD and colonoscopy start nexium b.i.d.  Her updated medication list for this problem  includes:    Ferrex 150 Forte 150-25-1 Mg-mcg-mg Caps (Iron polysacch cmplx-b12-fa) ..... One by mouth once daily  Orders: T-Basic Metabolic Panel [phone]) T-CBC w/Diff [phone]) T-Ferritin ([phone]) Augusto Gamble [phone]) T-Iron Binding Capacity (TIBC) ([address]) Hematology Referral (Hematology) Gastroenterology Referral (GI)  Problem # 2:  PSORIASIS (ICD-696.1) Hold enbrel in case it is contributing to anemia (myelosuppression) Orders: T-Hepatic Function ([address]75-64332)  Problem # 3:  OBESITY (ICD-278.00) screen for diabetes.  Orders: T- Hemoglobin A1C (95188-41660) T-TSH (63[address]-01093)  Complete Medication List: 1)  Enbrel 50 Mg/ml Soln (Etanercept) .... Inject im twice per week 2)  Clobetasol Propionate 0.05 % Crea (Clobetasol  propionate) .... Apply topically once daily as needed 3)  Bactrim Ds 800-160 Mg Tabs (Sulfamethoxazole-trimethoprim) .... Take 1 tablet by mouth two times a day for 14 days 4)  Omeprazole 40 Mg Cpdr (Omeprazole) .... One by mouth two times a day 15 minutes before meal 5)  Ferrex 150 Forte 150-25-1 Mg-mcg-mg Caps (Iron polysacch cmplx-b12-fa) .... One by mouth once daily  Patient Instructions: 1)  Please schedule a follow-up appointment in 1 month. 2)  Avoid NSAIDs 3)  No aspirin Prescriptions: FERREX 150 FORTE 150-25-1 MG-MCG-MG CAPS (IRON POLYSACCH CMPLX-B12-FA) one by mouth once daily  #90 x 3   Entered and Authorized by:   D. Thomos Lemons DO   Signed by:   D. Thomos Lemons DO on 10/03/2009   Method used:   Electronically to        [facility]. [address] [phone]* (retail)       [address] [place], [place]  [address]       Ph: [phone]       Fax: [phone]   RxID:   [id number] OMEPRAZOLE 40 MG CPDR (OMEPRAZOLE) one by mouth two times a day 15 minutes before meal  #60 x 3   Entered and Authorized by:   D. Thomos Lemons DO   Signed by:   D. Thomos Lemons DO on 10/03/2009   Method used:   Electronically to        [facility]. [address] [phone]* (retail)       [address] [place], [place]  [address]       Ph: [phone]       Fax: [phone]   RxID:   [id number]    Immunization History:  Influenza Immunization History:    Influenza:  declined (09/26/2009)   Contraindications/Deferment of Procedures/Staging:    Test/Procedure: FLU VAX    Reason for deferment: patient declined   Current Allergies (reviewed today): No known allergies

## 2010-08-18 NOTE — Procedures (Signed)
Summary: Colonoscopy-Dr. Medoff   Colonoscopy  Procedure date:  09/13/2001  Findings:      Location:  [facility].  Results: Normal.   Procedures Next Due Date:    Colonoscopy: 09/2011 Patient Name: [name], [name] MRN: [id number] Procedure Procedures: Colonoscopy CPT: 54098.  Colorectal cancer screening, average risk CPT: G0121.  Personnel: Endoscopist: Griffith Citron, MD, [facility].  Exam Location: Exam performed in Endoscopy Suite. Outpatient  Patient Consent: Procedure, Alternatives, Risks and Benefits discussed, consent obtained, from patient.  Indications  Average Risk Screening Routine.  History  Current Medications: Patient is taking an non-steroidal medication. Other: Metformin Other: Lisinopril Other: Glucotrol XL  Medical/ Surgical History: Adult Onset Diabetes, Hypertension,  Allergies: No known allergies.  Patient Habits Patient does not smoke. Drinking Status: non-drinker.  Pre-Exam Physical: Performed Sep 13, 2001. Entire physical exam was normal.  Exam Exam: Extent of exam reached: Cecum, extent intended: Cecum.  The cecum was identified by appendiceal orifice and IC valve. Patient position: on left side. Duration of exam: 13 minutes. Colon retroflexion performed. Images taken. ASA Classification: II. Tolerance: excellent.  Monitoring: Pulse and BP monitoring, Oximetry used. Supplemental O2 given.  Colon Prep Used Visicol for colon prep. Dose Used: 28 tablets. Prep results: excellent.  Sedation Meds: Patient assessed and found to be appropriate for moderate (conscious) sedation. Sedation was managed by the Endoscopist. Versed 5 mg. given IV. Fentanyl 50 mcg. given IV.  Comments: Adjustable scope used.  Comments: Time 1; tech 1; prep 1; Total = 3 Assessment Normal examination.  Comments: No colorectal neoplasia. Events  Unplanned Interventions: No intervention was required.  Unplanned Events: There were no  complications. Plans  Post Exam Instructions: Post sedation instructions given. Resume previous diet: 2 hours. Restart medications: tonight.  Medication Plan: Continue current medications.  Patient Education: Patient given standard instructions for: a normal exam. Yearly hemoccult testing recommended. Patient instructed to get routine colonoscopy every 10 years.  Disposition: After procedure patient sent to recovery. After recovery patient sent home.  This report was created from the original endoscopy report, which was reviewed and signed by the above listed endoscopist.

## 2010-08-18 NOTE — Op Note (Signed)
Summary: Infusion Orders/[facility]  Infusion Orders/[facility]   Imported By: Lanelle Bal 10/08/2009 12:15:07  _____________________________________________________________________  External Attachment:    Type:   Image     Comment:   External Document

## 2010-08-18 NOTE — Letter (Signed)
Summary: [facility] Instructions  [facility]  [address] [place], [place] [address]   Phone: [phone]  Fax: [phone]       RHYKER SILVERSMITH    [birth year]    MRN: [id number]        Procedure Day /Date:WEDNESDAY, 12/03/09     Arrival Time:2:30 PM     Procedure Time:3:30 PM     Location of Procedure:                    X  [facility] (4th Floor)                        PREPARATION FOR COLONOSCOPY WITH MOVIPREP/ENDO   Starting 5 days prior to your procedure 11/28/09 do not eat nuts, seeds, popcorn, corn, beans, peas,  salads, or any raw vegetables.  Do not take any fiber supplements (e.g. Metamucil, Citrucel, and Benefiber).  THE DAY BEFORE YOUR PROCEDURE         DATE: 12/02/09 ONG:EXBMWUX  1.  Drink clear liquids the entire day-NO SOLID FOOD  2.  Do not drink anything colored red or purple.  Avoid juices with pulp.  No orange juice.  3.  Drink at least 64 oz. (8 glasses) of fluid/clear liquids during the day to prevent dehydration and help the prep work efficiently.  CLEAR LIQUIDS INCLUDE: Water Jello Ice Popsicles Tea (sugar ok, no milk/cream) Powdered fruit flavored drinks Coffee (sugar ok, no milk/cream) Gatorade Juice: apple, white grape, white cranberry  Lemonade Clear bullion, consomm, broth Carbonated beverages (any kind) Strained chicken noodle soup Hard Candy                             4.  In the morning, mix first dose of MoviPrep solution:    Empty 1 Pouch A and 1 Pouch B into the disposable container    Add lukewarm drinking water to the top line of the container. Mix to dissolve    Refrigerate (mixed solution should be used within 24 hrs)  5.  Begin drinking the prep at 5:00 p.m. The MoviPrep container is divided by 4 marks.   Every 15 minutes drink the solution down to the next mark (approximately 8 oz) until the full liter is complete.   6.  Follow completed prep with 16 oz of clear liquid of your choice (Nothing  red or purple).  Continue to drink clear liquids until bedtime.  7.  Before going to bed, mix second dose of MoviPrep solution:    Empty 1 Pouch A and 1 Pouch B into the disposable container    Add lukewarm drinking water to the top line of the container. Mix to dissolve    Refrigerate  THE DAY OF YOUR PROCEDURE      DATE: 12/03/09 DAY: THURSDAY  Beginning at 10:30 a.m. (5 hours before procedure):         1. Every 15 minutes, drink the solution down to the next mark (approx 8 oz) until the full liter is complete.  2. Follow completed prep with 16 oz. of clear liquid of your choice.    3. You may drink clear liquids until 1:30 PM (2 HOURS BEFORE PROCEDURE).   MEDICATION INSTRUCTIONS  Unless otherwise instructed, you should take regular prescription medications with a small sip of water   as early as possible the morning of your procedure.  OTHER INSTRUCTIONS  You will need a responsible adult at least 48 years of age to accompany you and drive you home.   This person must remain in the waiting room during your procedure.  Wear loose fitting clothing that is easily removed.  Leave jewelry and other valuables at home.  However, you may wish to bring a book to read or  an iPod/MP3 player to listen to music as you wait for your procedure to start.  Remove all body piercing jewelry and leave at home.  Total time from sign-in until discharge is approximately 2-3 hours.  You should go home directly after your procedure and rest.  You can resume normal activities the  day after your procedure.  The day of your procedure you should not:   Drive   Make legal decisions   Operate machinery   Drink alcohol   Return to work  You will receive specific instructions about eating, activities and medications before you leave.    The above instructions have been reviewed and explained to me by   _______________________    I fully understand and can verbalize these  instructions _____________________________ Date _________

## 2010-08-18 NOTE — Letter (Signed)
Summary: Records Dated 03-28-08 thru 08-26-09/Meindert Lacie Scotts MD  Records Dated 03-28-08 thru 08-26-09/Meindert Lacie Scotts MD   Imported By: Lanelle Bal 10/21/2009 13:04:47  _____________________________________________________________________  External Attachment:    Type:   Image     Comment:   External Document

## 2010-08-26 ENCOUNTER — Encounter: Payer: Self-pay | Admitting: Internal Medicine

## 2010-09-02 LAB — CONVERTED CEMR LAB
BUN: 13 mg/dL
CO2: 23 meq/L
Calcium: 9.4 mg/dL
Chloride: 100 meq/L
Creatinine, Ser: 0.88 mg/dL
Glucose, Bld: 144 mg/dL — ABNORMAL HIGH
Hgb A1c MFr Bld: 7.4 % — ABNORMAL HIGH
Potassium: 4.4 meq/L
Sodium: 136 meq/L

## 2010-09-03 NOTE — Miscellaneous (Signed)
Summary: Orders Update  Clinical Lists Changes  Orders: Added new Test order of T-Basic Metabolic Panel ([id number]) - Signed Added new Test order of T- Hemoglobin A1C ([id number]) - Signed 

## 2010-09-07 ENCOUNTER — Ambulatory Visit (INDEPENDENT_AMBULATORY_CARE_PROVIDER_SITE_OTHER): Payer: 59 | Admitting: Internal Medicine

## 2010-09-07 ENCOUNTER — Encounter: Payer: Self-pay | Admitting: Internal Medicine

## 2010-09-07 DIAGNOSIS — E785 Hyperlipidemia, unspecified: Secondary | ICD-10-CM

## 2010-09-07 DIAGNOSIS — E782 Mixed hyperlipidemia: Secondary | ICD-10-CM

## 2010-09-07 DIAGNOSIS — G4700 Insomnia, unspecified: Secondary | ICD-10-CM

## 2010-09-07 DIAGNOSIS — E119 Type 2 diabetes mellitus without complications: Secondary | ICD-10-CM

## 2010-09-07 HISTORY — DX: Mixed hyperlipidemia: E78.2

## 2010-09-29 NOTE — Assessment & Plan Note (Signed)
Summary: 50 mth follow up/ss   Vital Signs:  Patient profile:   50 year old male Height:      69 inches Weight:      233.25 pounds BMI:     34.57 O2 Sat:      97 % on Room air Temp:     97.9 degrees F oral Pulse rate:   94 / minute Resp:     20 per minute BP sitting:   136 / 80  (right arm) Cuff size:   large  Vitals Entered By: Glendell Docker CMA (September 07, 2010 11:24 AM)  O2 Flow:  Room air CC: 6 Month Follow up, Type 2 diabetes mellitus follow-up Is Patient Diabetic? Yes Pain Assessment Patient in pain? no        Primary Care Provider:  Dondra Spry DO  CC:  6 Month Follow up and Type 2 diabetes mellitus follow-up.  History of Present Illness:  Type 2 Diabetes Mellitus Follow-Up      This is a 50 year old man who presents for Type 2 diabetes mellitus follow-up.  The patient denies polyuria, polydipsia, and weight gain.  The patient denies the following symptoms: chest pain.  Since the last visit the patient reports good dietary compliance and not exercising regularly.  he does not check blood sugar regularly  c/o difficulty staying asleep.  amitrityline causing excessive drowsiness next day    Preventive Screening-Counseling & Management  Alcohol-Tobacco     Smoking Status: quit  Allergies (verified): No Known Drug Allergies  Past History:  Past Medical History: Obesity Iron def Anemia  2011    Psoriasis   Hepatic Steatosis right colon cancer - adenocarcinoma CEA level is normal at 1.8.  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   Lungs:  normal respiratory  effort, normal breath sounds, no crackles, and no wheezes.   Heart:  normal rate, regular rhythm, no murmur, and no gallop.   Neurologic:  cranial nerves II-XII intact and gait normal.     Impression & Recommendations:  Problem # 1:  DIABETES MELLITUS, TYPE II (ICD-250.00) Assessment Improved  His updated medication list for this problem includes:    Metformin Hcl 1000 Mg Tabs (Metformin hcl) ..... One by mouth two times a day    Losartan Potassium 25 Mg Tabs (Losartan potassium) ..... One by mouth once daily  Future Orders: T-Basic Metabolic Panel [id number]) ... 11/30/2010 T- Hemoglobin A1C ([id number]) ... 11/30/2010 T-Urine Microalbumin w/creat. ratio [phone]) ... 11/30/2010  Labs Reviewed: Creat: 0.88 (09/02/2010)    Reviewed HgBA1c results: 7.4 (09/02/2010)  7.1 (03/24/2010)  Problem # 2:  INSOMNIA, CHRONIC (ICD-307.42) use trazadone as needed  Problem # 3:  HYPERLIPIDEMIA (ICD-272.4) add statin for primary prevention  His updated medication list for this problem includes:    Atorvastatin Calcium 10 Mg Tabs (Atorvastatin calcium) ..... One by mouth once daily  Future Orders: T-Hepatic Function [id number]) ... 11/30/2010 T-Lipid Profile [id number]) ... 11/30/2010  Labs Reviewed: SGOT: 31 (10/03/2009)   SGPT: 46 (10/03/2009)   HDL:35 (03/24/2010)  LDL:77 (03/24/2010)  Chol:188 (03/24/2010)  Trig:381 (03/24/2010)  Complete Medication List: 1)  Enbrel 50 Mg/ml Soln (Etanercept) .... Inject im twice per week (hold) 2)  Clobetasol Propionate 0.05 % Crea (Clobetasol propionate) .... Apply topically once daily as needed 3)  Omeprazole 40 Mg Cpdr (Omeprazole) .... One by mouth two times a day 15 minutes before meal 4)  Ferrex 150 Forte 150-25-1 Mg-mcg-mg Caps (Iron polysacch cmplx-b12-fa) .... One by mouth once daily 5)  Onetouch Ultra Test Strp (Glucose blood) .... Test once daily 6)  Metformin Hcl 1000 Mg Tabs (Metformin hcl) .... One by mouth two times  a day 7)  Trazodone Hcl 50 Mg Tabs (Trazodone hcl) .... One by mouth at bedtime as needed for sleep 8)  Losartan Potassium 25 Mg Tabs (Losartan potassium) .... One by mouth once daily 9)  Atorvastatin Calcium 10 Mg Tabs (Atorvastatin calcium) .... One by mouth once daily  Other Orders: Gastroenterology Referral (GI)  Patient Instructions: 1)  Limit your carbs to 25-30 grams per meal. 2)  Please schedule a follow-up appointment in 3 months. 3)  BMP prior to visit, ICD-9:  250.00 4)  HbgA1C prior to visit, ICD-9: 250.00 5)  Urine Microalbumin prior to visit, ICD-9: 250.00 6)  Hepatic Panel prior to visit, ICD-9: 272.4 7)  Lipid Panel prior to visit, ICD-9: 272.4 8)  Please return for lab work one (1) week before your next appointment.  Prescriptions: ATORVASTATIN CALCIUM 10 MG TABS (ATORVASTATIN CALCIUM) one by mouth once daily  #30 x 3   Entered and Authorized by:   D. Thomos Lemons DO   Signed by:   D. Thomos Lemons DO on 09/07/2010   Method used:   Print then Give to Patient   RxID:   [id number] LOSARTAN POTASSIUM 25 MG TABS (LOSARTAN POTASSIUM) one by mouth once daily  #30 x 3   Entered and Authorized by:   D. Thomos Lemons DO   Signed by:   D. Thomos Lemons DO on 09/07/2010   Method used:   Print then Give to Patient   RxID:   [id number] TRAZODONE HCL 50 MG TABS (TRAZODONE HCL) one by mouth at bedtime as needed for sleep  #30 x 5   Entered and Authorized by:   D. Thomos Lemons DO   Signed by:   D. Thomos Lemons DO on 09/07/2010   Method used:   Print then Give to Patient   RxID:   [id number] METFORMIN HCL 1000 MG TABS (METFORMIN HCL) one by mouth two times a day  #180 x 1   Entered and Authorized by:   D. Thomos Lemons DO   Signed by:   D. Thomos Lemons DO on 09/07/2010   Method used:   Faxed to ...       [facility] (retail)       [address]       [place], [place]    [place]       Ph: [phone]       Fax: [phone]   RxID:   [id number]    Orders  Added: 1)  Gastroenterology Referral [GI] 2)  T-Basic Metabolic Panel [id number] 3)  T- Hemoglobin A1C [[id number]] 4)  T-Urine Microalbumin w/creat. ratio [[id number]] 5)  T-Hepatic Function [[id number]] 6)  T-Lipid Profile [[id number]] 7)  Est. Patient Level III [62694]    Current Allergies (reviewed today): No known allergies

## 2010-10-05 LAB — CBC
HCT: 30.2 % — ABNORMAL LOW (ref 39.0–52.0)
Hemoglobin: 9.7 g/dL — ABNORMAL LOW (ref 13.0–17.0)
MCHC: 32 g/dL (ref 30.0–36.0)
MCHC: 32.6 g/dL (ref 30.0–36.0)
MCV: 79.1 fL (ref 78.0–100.0)
Platelets: 445 10*3/uL — ABNORMAL HIGH (ref 150–400)
RBC: 3.76 MIL/uL — ABNORMAL LOW (ref 4.22–5.81)
RBC: 4.3 MIL/uL (ref 4.22–5.81)
RDW: 18 % — ABNORMAL HIGH (ref 11.5–15.5)
RDW: 19.1 % — ABNORMAL HIGH (ref 11.5–15.5)
WBC: 10.6 10*3/uL — ABNORMAL HIGH (ref 4.0–10.5)
WBC: 12.4 10*3/uL — ABNORMAL HIGH (ref 4.0–10.5)
WBC: 8.2 10*3/uL (ref 4.0–10.5)

## 2010-10-05 LAB — BASIC METABOLIC PANEL
CO2: 26 mEq/L (ref 19–32)
Calcium: 8.5 mg/dL (ref 8.4–10.5)
GFR calc Af Amer: >60 mL/min (ref >60)
GFR calc non Af Amer: >60 mL/min (ref >60)
Glucose, Bld: 169 mg/dL — ABNORMAL HIGH (ref 70–99)
Potassium: 4.1 mEq/L (ref 3.5–5.1)

## 2010-10-05 LAB — TYPE AND SCREEN
ABO/RH(D): O POS
Antibody Screen: NEGATIVE

## 2010-10-05 LAB — COMPREHENSIVE METABOLIC PANEL
ALT: 78 U/L — ABNORMAL HIGH (ref 0–53)
Albumin: 2.9 g/dL — ABNORMAL LOW (ref 3.5–5.2)
Albumin: 3.5 g/dL (ref 3.5–5.2)
Alkaline Phosphatase: 43 U/L (ref 39–117)
Alkaline Phosphatase: 72 U/L (ref 39–117)
BUN: 8 mg/dL (ref 6–23)
Calcium: 8.9 mg/dL (ref 8.4–10.5)
Chloride: 101 mEq/L (ref 96–112)
GFR calc Af Amer: >60 mL/min (ref >60)
GFR calc Af Amer: >60 mL/min (ref >60)
GFR calc non Af Amer: >60 mL/min (ref >60)
Glucose, Bld: 168 mg/dL — ABNORMAL HIGH (ref 70–99)
Glucose, Bld: 207 mg/dL — ABNORMAL HIGH (ref 70–99)
Potassium: 4.4 mEq/L (ref 3.5–5.1)
Sodium: 134 mEq/L — ABNORMAL LOW (ref 135–145)
Total Bilirubin: 0.6 mg/dL (ref 0.3–1.2)
Total Bilirubin: 0.7 mg/dL (ref 0.3–1.2)
Total Protein: 7.6 g/dL (ref 6.0–8.3)

## 2010-10-05 LAB — DIFFERENTIAL
Basophils Absolute: 0 10*3/uL (ref 0.0–0.1)
Basophils Relative: 0 % (ref 0–1)
Neutrophils Relative %: 82 % — ABNORMAL HIGH (ref 43–77)

## 2010-10-05 LAB — URINALYSIS, ROUTINE W REFLEX MICROSCOPIC
Bilirubin Urine: NEGATIVE
Ketones, ur: NEGATIVE mg/dL
Protein, ur: NEGATIVE mg/dL

## 2010-10-05 LAB — URINE MICROSCOPIC-ADD ON

## 2010-10-05 LAB — ABO/RH: ABO/RH(D): O POS

## 2010-10-12 LAB — CBC
HCT: 27.7 % — ABNORMAL LOW (ref 39.0–52.0)
Hemoglobin: 8.3 g/dL — ABNORMAL LOW (ref 13.0–17.0)
MCHC: 30.9 g/dL (ref 30.0–36.0)
MCHC: 31.3 g/dL (ref 30.0–36.0)
MCHC: 31.6 g/dL (ref 30.0–36.0)
MCV: 69.2 fL — ABNORMAL LOW (ref 78.0–100.0)
MCV: 69.7 fL — ABNORMAL LOW (ref 78.0–100.0)
Platelets: 457 10*3/uL — ABNORMAL HIGH (ref 150–400)
RBC: 3.81 MIL/uL — ABNORMAL LOW (ref 4.22–5.81)
RDW: 17.1 % — ABNORMAL HIGH (ref 11.5–15.5)
WBC: 12.2 10*3/uL — ABNORMAL HIGH (ref 4.0–10.5)
WBC: 7.5 10*3/uL (ref 4.0–10.5)

## 2010-10-12 LAB — IRON AND TIBC
Iron: 15 ug/dL — ABNORMAL LOW (ref 42–135)
Saturation Ratios: 4 % — ABNORMAL LOW (ref 20–55)
TIBC: 422 ug/dL (ref 215–435)
UIBC: 407 ug/dL

## 2010-10-12 LAB — TSH: TSH: 2.168 u[IU]/mL (ref 0.350–4.500)

## 2010-10-12 LAB — COMPREHENSIVE METABOLIC PANEL
ALT: 42 U/L (ref 0–53)
AST: 38 U/L — ABNORMAL HIGH (ref 0–37)
Alkaline Phosphatase: 43 U/L (ref 39–117)
CO2: 23 mEq/L (ref 19–32)
Chloride: 103 mEq/L (ref 96–112)
GFR calc Af Amer: >60 mL/min (ref >60)
GFR calc non Af Amer: >60 mL/min (ref >60)
Glucose, Bld: 131 mg/dL — ABNORMAL HIGH (ref 70–99)
Potassium: 4.1 mEq/L (ref 3.5–5.1)
Sodium: 134 mEq/L — ABNORMAL LOW (ref 135–145)
Total Bilirubin: 0.7 mg/dL (ref 0.3–1.2)

## 2010-10-12 LAB — HEMOGLOBIN AND HEMATOCRIT, BLOOD
Hemoglobin: 8.1 g/dL — ABNORMAL LOW (ref 13.0–17.0)
Hemoglobin: 9 g/dL — ABNORMAL LOW (ref 13.0–17.0)

## 2010-10-12 LAB — VITAMIN B12: Vitamin B-12: >2000 pg/mL — ABNORMAL HIGH (ref 211–911)

## 2010-10-12 LAB — FERRITIN: Ferritin: 82 ng/mL (ref 22–322)

## 2010-10-16 ENCOUNTER — Telehealth: Payer: Self-pay | Admitting: Internal Medicine

## 2010-10-16 DIAGNOSIS — D508 Other iron deficiency anemias: Secondary | ICD-10-CM

## 2010-10-16 DIAGNOSIS — K219 Gastro-esophageal reflux disease without esophagitis: Secondary | ICD-10-CM

## 2010-10-16 NOTE — Telephone Encounter (Signed)
Refill- ferrex 150 forte plus capsules. Take 1 capsule by mouth every day. Qty 90. Last fill 3.29.12.  Refill- omeprazole 40mg  capsules. Take one capsule by mouth twice daily 15 minutes before a meal. Qty 60. Last fill 2.8.12.

## 2010-10-17 MED ORDER — POLYSACCHARIDE IRON COMPLEX 150 MG PO CAPS: 150.0000 mg | ORAL_CAPSULE | Freq: Every day | ORAL | Status: DC

## 2010-10-17 MED ORDER — OMEPRAZOLE 40 MG PO CPDR: 40.0000 mg | DELAYED_RELEASE_CAPSULE | Freq: Two times a day (BID) | ORAL | Status: DC

## 2010-10-17 NOTE — Telephone Encounter (Signed)
rx refill faxed to pharmacy 

## 2010-10-22 ENCOUNTER — Telehealth: Payer: Self-pay | Admitting: *Deleted

## 2010-10-22 MED ORDER — FERREX 150 FORTE PLUS 50-100 MG PO CAPS: 150.0000 mg | ORAL_CAPSULE | Freq: Every day | ORAL | Status: DC

## 2010-10-22 NOTE — Telephone Encounter (Signed)
Received call from Molli Hazard at [facility] wanting to verify if pt should continue [employer] as he was on before or continue with Ferrex 150 recently sent to pharmacy. Advised Molli Hazard pt can continue Forte Plus as before.

## 2010-11-26 ENCOUNTER — Telehealth: Payer: Self-pay | Admitting: Internal Medicine

## 2010-11-26 NOTE — Telephone Encounter (Signed)
He went to dermatologist and she is concerned about his liver labs alt ast are elevated.  He has an appt with Dr Artist Pais on 5-21 and is going for labs the week prior.  Dr Vear Clock would like Dr Artist Pais to order these labs done again at that time.

## 2010-11-26 NOTE — Telephone Encounter (Signed)
Ok to add LFTs to next lab draw Use 790.4

## 2010-11-27 NOTE — Telephone Encounter (Signed)
Call placed to patient at [phone], no answer. A detailed voice message was left informing patient blood work has been entered for [facility] one week before his appointment. He was advised to call back if is going to have his blood work done in [employer]

## 2010-11-30 ENCOUNTER — Other Ambulatory Visit: Payer: Self-pay | Admitting: Internal Medicine

## 2010-11-30 ENCOUNTER — Other Ambulatory Visit: Payer: 59

## 2010-11-30 LAB — BASIC METABOLIC PANEL
CO2: 22 mEq/L (ref 19–32)
Calcium: 9.2 mg/dL (ref 8.4–10.5)
Chloride: 104 mEq/L (ref 96–112)
Creat: 0.84 mg/dL (ref 0.40–1.50)
Glucose, Bld: 202 mg/dL — ABNORMAL HIGH (ref 70–99)
Sodium: 136 mEq/L (ref 135–145)

## 2010-11-30 LAB — HEMOGLOBIN A1C: Hgb A1c MFr Bld: 8.7 % — ABNORMAL HIGH (ref <5.7)

## 2010-11-30 LAB — LIPID PANEL
HDL: 25 mg/dL — ABNORMAL LOW (ref >39)
Total CHOL/HDL Ratio: 8.6 Ratio

## 2010-11-30 LAB — HEPATIC FUNCTION PANEL
Albumin: 4 g/dL (ref 3.5–5.2)
Bilirubin, Direct: 0.1 mg/dL (ref 0.0–0.3)
Total Bilirubin: 0.7 mg/dL (ref 0.3–1.2)

## 2010-12-01 ENCOUNTER — Encounter: Payer: Self-pay | Admitting: Internal Medicine

## 2010-12-01 LAB — MICROALBUMIN / CREATININE URINE RATIO: Microalb Creat Ratio: 2.9 mg/g (ref 0.0–30.0)

## 2010-12-03 ENCOUNTER — Encounter: Payer: Self-pay | Admitting: Internal Medicine

## 2010-12-03 LAB — IRON AND TIBC
Iron: 64 ug/dL (ref 42–165)
TIBC: 385 ug/dL (ref 215–435)
UIBC: 321 ug/dL

## 2010-12-07 ENCOUNTER — Encounter: Payer: Self-pay | Admitting: Family

## 2010-12-07 ENCOUNTER — Ambulatory Visit (INDEPENDENT_AMBULATORY_CARE_PROVIDER_SITE_OTHER): Payer: 59 | Admitting: Family

## 2010-12-07 ENCOUNTER — Ambulatory Visit: Payer: 59 | Admitting: Internal Medicine

## 2010-12-07 ENCOUNTER — Ambulatory Visit (AMBULATORY_SURGERY_CENTER): Payer: 59 | Admitting: *Deleted

## 2010-12-07 VITALS — Ht 68.0 in | Wt 239.7 lb

## 2010-12-07 VITALS — BP 120/76 | HR 72 | Temp 98.7°F | Resp 18 | Ht 69.0 in | Wt 239.1 lb

## 2010-12-07 DIAGNOSIS — Z85038 Personal history of other malignant neoplasm of large intestine: Secondary | ICD-10-CM

## 2010-12-07 DIAGNOSIS — E781 Pure hyperglyceridemia: Secondary | ICD-10-CM

## 2010-12-07 DIAGNOSIS — E119 Type 2 diabetes mellitus without complications: Secondary | ICD-10-CM

## 2010-12-07 MED ORDER — SITAGLIPTIN PHOSPHATE 100 MG PO TABS: 100.0000 mg | ORAL_TABLET | Freq: Every day | ORAL | Status: DC

## 2010-12-07 MED ORDER — PEG-KCL-NACL-NASULF-NA ASC-C 100 G PO SOLR: ORAL | Status: DC

## 2010-12-07 MED ORDER — FENOFIBRATE 145 MG PO TABS: 145.0000 mg | ORAL_TABLET | Freq: Every day | ORAL | Status: DC

## 2010-12-07 NOTE — Progress Notes (Signed)
Subjective:    Patient ID: [name], male    DOB: [birth year], 50 y.o.   MRN: [id number]  HPI Mr.  [name] is a 50 yr old male who presents today for follow up of his diabetes.  Hyperlipidemia- reports + compliance with statin. Denies myalgias.  DM2- reports that he has not been very compliant with diet.  He is not exercising.  He has been living remotely due to his job and has found it difficulty to eat healthy.  He has not been checking his sugar regularly.  Reports + fatigue.   Review of Systems  See HPI    Past Medical History  Diagnosis Date  . Obesity   . Iron deficiency anemia 2011  . Psoriasis   . Hepatic artery stenosis   . Colon cancer     right colon cancer- adenocarcinoma CEA level isnrmal at 1.8  . Diabetes mellitus   . Hypertension   . Hyperlipidemia     History   Social History  . Marital Status: Single    Spouse Name: [name]    Number of Children: 0  . Years of Education: N/A   Occupational History  . Chiropodist and fax   Social History Main Topics  . Smoking status: Former Games developer  . Smokeless tobacco: Not on file  . Alcohol Use: No  . Drug Use: No  . Sexually Active: Not on file   Other Topics Concern  . Not on file   Social History Narrative   Occupation: Agricultural consultant and fax)Single  (lives with [name])no children former smokerIllicit Drug Use - no     Past Surgical History  Procedure Date  . Cholecystectomy 1994  . Hemicolectomy     12/17/2009 right  . Colonoscopy   . Polypectomy   . Carpal tunnel release     RIGHT    Family History  Problem Relation Age of Onset  . Stomach cancer Mother     diedinher 30's  . Diabetes type II Brother     boderline  . Diabetes Brother     type II  . Other Neg Hx     cad,prostate ca, colon ca  . Coronary artery disease Neg Hx   . Cancer Neg Hx     colon, prostate    No Known Allergies  Current Outpatient Prescriptions on File Prior to Visit  Medication Sig Dispense  Refill  . atorvastatin (LIPITOR) 10 MG tablet Take 10 mg by mouth daily.        . Clobetasol Prop Emollient Base 0.05 % emollient cream Apply topically daily as needed.        . etanercept (ENBREL) 50 MG/ML injection Inject 50 mg into the skin once a week. Inject twice per week -hold       . Fe-Succ Ac-C-Thre Ac-B12-FA (FERREX 150 FORTE PLUS) 50-100 MG CAPS Take 150 mg by mouth daily.  30 each  5  . glucose blood (ONE TOUCH TEST STRIPS) test strip Use to check blood sugar once a day       . losartan (COZAAR) 25 MG tablet Take 25 mg by mouth daily.        . metFORMIN (GLUCOPHAGE) 1000 MG tablet Take 1,000 mg by mouth 2 (two) times daily with a meal.        . omeprazole (PRILOSEC) 40 MG capsule Take 40 mg by mouth 2 (two) times daily before a meal.        . traZODone (  DESYREL) 50 MG tablet Take 50 mg by mouth at bedtime as needed.        . peg 3350 powder (MOVIPREP) 100 G SOLR MOVIPREP-TAKE AS DIRECTED  1 kit  0    BP 120/76  Pulse 72  Temp(Src) 98.7 F (37.1 C) (Oral)  Resp 18  Ht 5\' 9"  (1.753 m)  Wt 239 lb 1.3 oz (108.446 kg)  BMI 35.31 kg/m2    Objective:   Physical Exam  Gen: overweight, male, awake, alert, NAD CV: s1/s2, RRR, no murmur Resp: BS CTA bilaterally without wheezes/rales or rhonchi Ext: no peripheral edema is noted.         Assessment & Plan:

## 2010-12-07 NOTE — Patient Instructions (Signed)
Check your sugars daily.  Record these numbers and bring with you to your next visit. Work hard on diet, exercise and weight loss. Follow up in 3 months with Dr. Artist Pais.

## 2010-12-08 ENCOUNTER — Telehealth: Payer: Self-pay | Admitting: Family

## 2010-12-08 MED ORDER — FENOFIBRATE 160 MG PO TABS: 160.0000 mg | ORAL_TABLET | Freq: Every day | ORAL | Status: DC

## 2010-12-08 NOTE — Telephone Encounter (Signed)
Fenofibrate 145mg  PO daily please.

## 2010-12-08 NOTE — Telephone Encounter (Signed)
Per Melissa, ok to start Fenofibrate 160mg  daily. Rx sent to pharmacy and detailed message left on pt's voicemail.

## 2010-12-08 NOTE — Telephone Encounter (Signed)
He called his insurance [employer] they say tricor is not covered under his plan.  He needs a scripts for an alternative med called in to [facility] on [address] in [place]

## 2010-12-08 NOTE — Telephone Encounter (Signed)
Spoke to pt, he states his insurance will not cover Tricor and has other alternatives but he does not have a list of covered meds. Called ins and was given # to [facility] [phone].  [employer] states pt's plan covers Fenofibrate, antara and Lipofen. Spoke to pharmacist and he verifies that Fenofibrate is covered but needs to know which strength: 134 or 160mg ? Please advise.

## 2010-12-10 ENCOUNTER — Telehealth: Payer: Self-pay | Admitting: *Deleted

## 2010-12-10 NOTE — Telephone Encounter (Signed)
Faxed most recent labs to Dr Irineo Axon at [facility] [phone].

## 2010-12-10 NOTE — Telephone Encounter (Signed)
Message copied by Mervin Kung on Thu Dec 10, 2010 10:14 AM ------      Message from: O'SULLIVAN, MELISSA      Created: Wed Dec 09, 2010 10:25 PM       Please send copies of labs to his dermatologist. thanks

## 2010-12-14 ENCOUNTER — Encounter: Payer: Self-pay | Admitting: Family

## 2010-12-14 DIAGNOSIS — E781 Pure hyperglyceridemia: Secondary | ICD-10-CM

## 2010-12-14 HISTORY — DX: Pure hyperglyceridemia: E78.1

## 2010-12-14 NOTE — Assessment & Plan Note (Signed)
Deteriorated.  Will add Januvia.  20 minutes spent with the patient today.  >50% of this time was spent counseling the patient on his diabetes, diet, exercise and weight loss.

## 2010-12-14 NOTE — Assessment & Plan Note (Signed)
Triglycerides noted to be >700.  Discussed with patient the importance of dietary compliance with the diabetic diet and avoiding concentrated sweets. Will also add tricor.

## 2010-12-30 ENCOUNTER — Encounter: Payer: Self-pay | Admitting: Internal Medicine

## 2010-12-30 ENCOUNTER — Ambulatory Visit (AMBULATORY_SURGERY_CENTER): Payer: 59 | Admitting: Internal Medicine

## 2010-12-30 VITALS — BP 126/82 | HR 83 | Temp 97.8°F | Resp 22 | Ht 68.0 in | Wt 239.0 lb

## 2010-12-30 DIAGNOSIS — Z85038 Personal history of other malignant neoplasm of large intestine: Secondary | ICD-10-CM

## 2010-12-30 DIAGNOSIS — D126 Benign neoplasm of colon, unspecified: Secondary | ICD-10-CM

## 2010-12-30 DIAGNOSIS — Z1211 Encounter for screening for malignant neoplasm of colon: Secondary | ICD-10-CM

## 2010-12-30 LAB — GLUCOSE, CAPILLARY: Glucose-Capillary: 176 mg/dL — ABNORMAL HIGH (ref 70–99)

## 2010-12-30 MED ORDER — SODIUM CHLORIDE 0.9 % IV SOLN: 500.0000 mL | INTRAVENOUS | Status: DC

## 2010-12-30 NOTE — Patient Instructions (Signed)
Findings:  Polyp  Recommendations:  Repeat colonoscopy in 3 years  Discharge instructions and polyp teaching explained and given to pt and care partner

## 2010-12-31 ENCOUNTER — Telehealth: Payer: Self-pay

## 2010-12-31 NOTE — Telephone Encounter (Signed)
Follow up Call- Patient questions:  Do you have a fever, pain , or abdominal swelling? no Pain Score  0 *  Have you tolerated food without any problems? yes  Have you been able to return to your normal activities? yes  Do you have any questions about your discharge instructions: Diet   no Medications  no Follow up visit  no  Do you have questions or concerns about your Care? no  Actions: * If pain score is 4 or above: No action needed, pain <4. "I'm all right", per pt.maw

## 2011-01-06 ENCOUNTER — Telehealth: Payer: Self-pay | Admitting: Internal Medicine

## 2011-01-06 MED ORDER — ATORVASTATIN CALCIUM 10 MG PO TABS: 10.0000 mg | ORAL_TABLET | Freq: Every day | ORAL | Status: DC

## 2011-01-06 MED ORDER — LOSARTAN POTASSIUM 25 MG PO TABS: 25.0000 mg | ORAL_TABLET | Freq: Every day | ORAL | Status: DC

## 2011-01-06 NOTE — Telephone Encounter (Signed)
Refill-losartan 25mg  tablets. Take one tablet by mouth daily. Qty 30. Last fill 6.19.12

## 2011-01-06 NOTE — Telephone Encounter (Signed)
Rx refill sent to pharmacy. 

## 2011-01-06 NOTE — Telephone Encounter (Signed)
Refill-lipitor 10mg  tablets. Take 1 tablet by mouth every day. Qty 30. Last fill 6.19.12

## 2011-02-18 ENCOUNTER — Other Ambulatory Visit: Payer: Self-pay | Admitting: Internal Medicine

## 2011-02-18 DIAGNOSIS — D508 Other iron deficiency anemias: Secondary | ICD-10-CM

## 2011-02-18 DIAGNOSIS — E785 Hyperlipidemia, unspecified: Secondary | ICD-10-CM

## 2011-02-18 DIAGNOSIS — E119 Type 2 diabetes mellitus without complications: Secondary | ICD-10-CM

## 2011-02-19 ENCOUNTER — Ambulatory Visit: Payer: 59 | Admitting: Internal Medicine

## 2011-02-22 LAB — CBC
MCH: 31.3 pg (ref 26.0–34.0)
Platelets: 247 10*3/uL (ref 150–400)
RBC: 4.31 MIL/uL (ref 4.22–5.81)

## 2011-02-22 LAB — BASIC METABOLIC PANEL
BUN: 12 mg/dL (ref 6–23)
Calcium: 8.7 mg/dL (ref 8.4–10.5)
Glucose, Bld: 178 mg/dL — ABNORMAL HIGH (ref 70–99)
Potassium: 4.3 mEq/L (ref 3.5–5.3)

## 2011-02-22 LAB — LIPID PANEL
Cholesterol: 143 mg/dL (ref 0–200)
HDL: 31 mg/dL — ABNORMAL LOW (ref >39)
Total CHOL/HDL Ratio: 4.6 Ratio

## 2011-02-22 LAB — HEPATIC FUNCTION PANEL
Albumin: 3.8 g/dL (ref 3.5–5.2)
Total Bilirubin: 0.8 mg/dL (ref 0.3–1.2)

## 2011-02-23 LAB — HEMOGLOBIN A1C
Hgb A1c MFr Bld: 9.8 % — ABNORMAL HIGH (ref <5.7)
Mean Plasma Glucose: 235 mg/dL — ABNORMAL HIGH (ref <117)

## 2011-02-24 ENCOUNTER — Encounter: Payer: Self-pay | Admitting: Internal Medicine

## 2011-02-24 ENCOUNTER — Ambulatory Visit (INDEPENDENT_AMBULATORY_CARE_PROVIDER_SITE_OTHER): Payer: 59 | Admitting: Internal Medicine

## 2011-02-24 DIAGNOSIS — K7689 Other specified diseases of liver: Secondary | ICD-10-CM

## 2011-02-24 DIAGNOSIS — E119 Type 2 diabetes mellitus without complications: Secondary | ICD-10-CM

## 2011-02-24 DIAGNOSIS — E785 Hyperlipidemia, unspecified: Secondary | ICD-10-CM

## 2011-02-24 NOTE — Progress Notes (Signed)
  Subjective:    Patient ID: [name], male    DOB: June 07, 1961, 50 y.o.   MRN: [id number]  HPI Pt presents to clinic for followup of multiple medical problems.  Reviewed a1c elevated 9.8. States just within last one week has begun dietary modification. Recent fsbs ~180's. No hypoglycemia although has intermittent mid morning headaches after metformin that improve with food. Admits noncompliance with [place] and metformin. No current exercise program but does belong to a gym. Reviewed depressed hdl. LFT's mildly elevated and stable with h/o fatty liver. No active complaints.  Reviewed pmh, medications and allergies.    Review of Systems see hpi     Objective:   Physical Exam  Nursing note and vitals reviewed. Constitutional: He appears well-developed and well-nourished. No distress.  HENT:  Head: Normocephalic and atraumatic.  Right Ear: External ear normal.  Left Ear: External ear normal.  Eyes: Conjunctivae are normal. No scleral icterus.  Neck: Neck supple. Carotid bruit is not present.  Cardiovascular: Normal rate, regular rhythm and normal heart sounds.  Exam reveals no gallop and no friction rub.   No murmur heard. Pulmonary/Chest: Effort normal and breath sounds normal. No respiratory distress. He has no wheezes. He has no rales.  Neurological: He is alert.  Skin: Skin is warm and dry. He is not diaphoretic.  Psychiatric: He has a normal mood and affect.          Assessment & Plan:

## 2011-02-24 NOTE — Assessment & Plan Note (Signed)
Poor control. Long discussion held regarding need for medication compliance, dietary modification and regular cardio exercise at least 4 days a week. Understands potential for diabetic complications with continued hyperglycemia.

## 2011-02-24 NOTE — Assessment & Plan Note (Signed)
Continue statin tx. Begin regular exercise program.

## 2011-02-24 NOTE — Assessment & Plan Note (Signed)
Discussed correlation with wt and cholesterol. Recommend exercise and wt loss.

## 2011-02-24 NOTE — Patient Instructions (Signed)
Please schedule chem7, a1c, urine microalbumin 250.0 prior to next visit 

## 2011-03-16 ENCOUNTER — Telehealth: Payer: Self-pay | Admitting: Internal Medicine

## 2011-03-16 ENCOUNTER — Other Ambulatory Visit: Payer: Self-pay | Admitting: Family

## 2011-03-16 NOTE — Telephone Encounter (Signed)
Januvia #30 x 2 refills sent to [facility].

## 2011-03-16 NOTE — Telephone Encounter (Signed)
Refill-trazodone 50mg  tablets. Take one tablet by mouth every night at bedtime as needed for sleep. Qty 30. Last fill 7.31.12.

## 2011-03-17 MED ORDER — TRAZODONE HCL 50 MG PO TABS: 50.0000 mg | ORAL_TABLET | Freq: Every evening | ORAL | Status: DC | PRN

## 2011-03-17 NOTE — Telephone Encounter (Signed)
Rx called to [phone] to [facility] assistant.

## 2011-03-17 NOTE — Telephone Encounter (Signed)
Dr. Rodena Medin is it okay to refill this medication for this patient?

## 2011-03-17 NOTE — Telephone Encounter (Signed)
Ok rf 5 

## 2011-03-24 ENCOUNTER — Other Ambulatory Visit: Payer: Self-pay | Admitting: Family

## 2011-05-17 ENCOUNTER — Other Ambulatory Visit: Payer: 59

## 2011-05-28 ENCOUNTER — Encounter: Payer: Self-pay | Admitting: Internal Medicine

## 2011-05-28 ENCOUNTER — Ambulatory Visit (INDEPENDENT_AMBULATORY_CARE_PROVIDER_SITE_OTHER): Payer: 59 | Admitting: Internal Medicine

## 2011-05-28 ENCOUNTER — Other Ambulatory Visit: Payer: Self-pay | Admitting: Internal Medicine

## 2011-05-28 DIAGNOSIS — K7689 Other specified diseases of liver: Secondary | ICD-10-CM

## 2011-05-28 DIAGNOSIS — E119 Type 2 diabetes mellitus without complications: Secondary | ICD-10-CM

## 2011-05-28 DIAGNOSIS — R748 Abnormal levels of other serum enzymes: Secondary | ICD-10-CM

## 2011-05-28 DIAGNOSIS — R7401 Elevation of levels of liver transaminase levels: Secondary | ICD-10-CM

## 2011-05-28 LAB — HEPATIC FUNCTION PANEL
ALT: 122 U/L — ABNORMAL HIGH (ref 0–53)
Bilirubin, Direct: 0.2 mg/dL (ref 0.0–0.3)
Total Protein: 6.9 g/dL (ref 6.0–8.3)

## 2011-05-28 LAB — HEMOGLOBIN A1C: Hgb A1c MFr Bld: 8.7 % — ABNORMAL HIGH (ref <5.7)

## 2011-05-28 MED ORDER — METFORMIN HCL 1000 MG PO TABS: 1000.0000 mg | ORAL_TABLET | Freq: Two times a day (BID) | ORAL | Status: DC

## 2011-05-28 NOTE — Progress Notes (Signed)
  Subjective:    Patient ID: [name], male    DOB: Mar 07, 1961, 50 y.o.   MRN: [id number]  HPI Pt presents to clinic for followup of multiple medical problems. Reports he is not checking his fsbs nor taking his diabetic medication regularly due to his cat having kidney failure. FSBS in clinic 160. Recent cbc, chem7 from dermatology reviewed with glu 295. Last a1c reviewed under poor control. Feels fine without complaints. States insurance company declining to cover [place] in the future.  Past Medical History  Diagnosis Date  . Obesity   . Iron deficiency anemia 2011  . Psoriasis   . Hepatic artery stenosis   . Colon cancer     right colon cancer- adenocarcinoma CEA level isnrmal at 1.8  . Diabetes mellitus   . Hypertension   . Hyperlipidemia   . Hypertriglyceridemia 12/14/2010   Past Surgical History  Procedure Date  . Cholecystectomy 1994  . Hemicolectomy     12/17/2009 right  . Colonoscopy   . Polypectomy   . Carpal tunnel release     RIGHT    reports that he has quit smoking. He does not have any smokeless tobacco history on file. He reports that he does not drink alcohol or use illicit drugs. family history includes Diabetes in his brother; Diabetes type II in his brother; and Stomach cancer in his mother.  There is no history of Other, and Coronary artery disease, and Cancer, . No Known Allergies   Review of Systems see hpi     Objective:   Physical Exam  Nursing note and vitals reviewed. Constitutional: He appears well-developed and well-nourished. No distress.  HENT:  Head: Normocephalic and atraumatic.  Right Ear: External ear normal.  Left Ear: External ear normal.  Eyes: Conjunctivae are normal. No scleral icterus.  Neck: Neck supple. No JVD present.  Cardiovascular: Normal rate, regular rhythm and normal heart sounds.  Exam reveals no gallop and no friction rub.   No murmur heard. Pulmonary/Chest: Effort normal and breath sounds normal. No respiratory  distress. He has no wheezes. He has no rales.  Neurological: He is alert.  Skin: Skin is warm and dry. He is not diaphoretic.  Psychiatric: He has a normal mood and affect.  Diabetic foot exam: pt declined       Assessment & Plan:

## 2011-05-28 NOTE — Assessment & Plan Note (Signed)
Wt increased. Recheck lft

## 2011-05-28 NOTE — Patient Instructions (Signed)
Please schedule chem7, a1c (250.0) and lipid/lft (272.4) prior to next visit 

## 2011-05-28 NOTE — Assessment & Plan Note (Signed)
H/o suboptimal control complicated by non compliance. Reinforced the need for daily fsbs and to take medication daily. Obtain a1c. Did discuss possible dc of januvia and addition of lantus qhs pending lab results.

## 2011-05-29 LAB — MICROALBUMIN / CREATININE URINE RATIO
Creatinine, Urine: 168.9 mg/dL
Microalb, Ur: 0.7 mg/dL (ref 0.00–1.89)

## 2011-06-04 ENCOUNTER — Telehealth: Payer: Self-pay | Admitting: Internal Medicine

## 2011-06-04 NOTE — Telephone Encounter (Signed)
Patient states he asked about going on methatrexate when he was in to see Dr Rodena Medin on 11-9.  Hasn't heard anything.  Is Dr Rodena Medin going to give him the rx

## 2011-06-04 NOTE — Telephone Encounter (Signed)
methrotrexate is prescribed by the specialists. i don't prescribe it. May refill it once in a while if someone is running out.

## 2011-06-04 NOTE — Telephone Encounter (Signed)
Call returned to patient at [phone]. He was informed per Dr Rodena Medin instructions.

## 2011-06-07 ENCOUNTER — Telehealth: Payer: Self-pay | Admitting: Internal Medicine

## 2011-06-07 NOTE — Telephone Encounter (Signed)
Patient would like  Medication   Lantas called in at [facility]  , [place] ,per patient this medication was discussed at last office visit

## 2011-06-08 MED ORDER — "INSULIN SYRINGE 29G X 1/2"" 1 ML MISC": Status: DC

## 2011-06-08 MED ORDER — INSULIN GLARGINE 100 UNIT/ML ~~LOC~~ SOLN: 10.0000 [IU] | Freq: Every day | SUBCUTANEOUS | Status: DC

## 2011-06-08 NOTE — Telephone Encounter (Signed)
Unless he has preference then vial for cost consideration

## 2011-06-08 NOTE — Telephone Encounter (Signed)
Rxs sent to pharmacy for Lantus vials and syringes (3 month supply of each) x no refills.

## 2011-06-08 NOTE — Telephone Encounter (Signed)
Spoke to pt and notified him of labs per Dr Rodena Medin: Blood sugar avg ~200. Off januvia due to cost/insurance. Add lantus 10 units sq qhs. Will need to increase after 2wks call fsbs report-fasting am blood sugar. lft increasing. Has h/o fatty liver. Wt loss can improve liver. Pt voices understanding and states he does not need to schedule a nurse visit for injection teaching as his girlfriend is diabetic and a nurse and will teach him what he needs to know. Pt is requesting a 3 month supply of insulin as it will be a cheaper copay? Please advise if we are sending rx for insulin vial or solostar (pen)?

## 2011-06-22 ENCOUNTER — Telehealth: Payer: Self-pay | Admitting: Internal Medicine

## 2011-06-22 NOTE — Telephone Encounter (Signed)
Call the patient and he will give you the numbers

## 2011-06-23 MED ORDER — INSULIN GLARGINE 100 UNIT/ML ~~LOC~~ SOLN: 20.0000 [IU] | Freq: Every day | SUBCUTANEOUS | Status: DC

## 2011-06-23 NOTE — Telephone Encounter (Signed)
Patient returned phone call, he was informed per Dr Emilee Hero instructions and has verbalized little understanding, due to numerous interruptions only focusing in on the increase of his insulin.He was informed of the background noise.  Patient stated that he was at work and could not really talk.  So he could not get into this conversation, with me. He was asked what time did he get off of work, and he stated after 5 pm. Patient was informed a call would be returned to him.  Patient called back from a different phone number right after the call ended. Clarification attempted once again. Patient stated that he did not fully understand how he was to increase his insulin and he would have his girlfriend call for clarification.   Patients girlfriend [name] called on patients behalf. She was informed per Dr Rodena Medin instructions and has verbalized understanding. She stated she would assist patient with dosing of insulin.   Refill of Lantus sent to pharmacy.

## 2011-06-23 NOTE — Telephone Encounter (Signed)
Ok if he's doing ok with giving the shots then increase dose to 20 units sq qhs. After 5 days if am fasting sugars are still high >130 then want him to increase dose 3 units every 3 days until am fasting sugars are less than 130 without lows (60's or lower). Call report of fsbs in 2wk

## 2011-06-23 NOTE — Telephone Encounter (Signed)
Call placed to patient at [phone]. He stated his blood sugars as follows, on  11/20-213 11/21-213 11/22-166 11/23-190  11/24-179 11/ 25 -195  11/26-232  11/27-218  11/28 -241 11/29-291 11/30-237 12-1-204 12/2-219 12-3-192 12/4-200 12/5 211.  He is using 10 units of Lantus daily.

## 2011-06-23 NOTE — Telephone Encounter (Signed)
Call placed to patient at [phone], no answer. A voice message was left for patient to return call regarding blood sugars.

## 2011-06-24 MED ORDER — GLUCOSE BLOOD VI STRP: ORAL_STRIP | Status: DC

## 2011-07-07 ENCOUNTER — Telehealth: Payer: Self-pay | Admitting: *Deleted

## 2011-07-07 NOTE — Telephone Encounter (Signed)
Reinforce the instructions. Increase 3 units every 3 days until fasting glucose <130. He has only increased a small amount and is taking a low dose. appt to discuss if he isn't understanding the instructions

## 2011-07-07 NOTE — Telephone Encounter (Signed)
Patient call to report his blood sugar readings: At 20 units       12/6  254    12/7  183    12/8  164    12/9  173   12/10  260 At 23 units on  12/11   202               12/12   221               12/13   231  At 26 units on  12/14 226              12/15 241               12/16  220   At 29 units on   12/17  206  He stated that he will be up to 32 units starting tonight.

## 2011-07-08 NOTE — Telephone Encounter (Signed)
Call placed to patient at [phone],he was advised per Dr Rodena Medin instructions, and has verbalized understanding.

## 2011-07-22 ENCOUNTER — Telehealth: Payer: Self-pay | Admitting: *Deleted

## 2011-07-22 NOTE — Telephone Encounter (Signed)
Good progress. Now showing some fsbs readings less than 130. Probably can increase a few more times until most fsbs are less than 130 without low blood sugars

## 2011-07-22 NOTE — Telephone Encounter (Signed)
Pt called to report the following fasting glucose readings:   32 units lantus 07/08/11 157 07/09/11 146 07/10/11 145  35 units lantus 12/23  175 12/24  153 12/25  187  38 units lantus 12/26  166 12/27  169 12/28  140  41 units lantus 12/29   119 12/30  135 12/31  126 07/20/11  169 07/21/11  130 07/22/11  141

## 2011-07-23 NOTE — Telephone Encounter (Signed)
Pt.notified

## 2011-08-27 ENCOUNTER — Ambulatory Visit: Payer: 59 | Admitting: Internal Medicine

## 2011-09-03 ENCOUNTER — Other Ambulatory Visit (HOSPITAL_COMMUNITY): Payer: Self-pay | Admitting: Otolaryngology

## 2011-09-03 ENCOUNTER — Encounter: Payer: Self-pay | Admitting: Internal Medicine

## 2011-09-03 DIAGNOSIS — H9192 Unspecified hearing loss, left ear: Secondary | ICD-10-CM

## 2011-09-05 ENCOUNTER — Ambulatory Visit (HOSPITAL_COMMUNITY)
Admission: RE | Admit: 2011-09-05 | Discharge: 2011-09-05 | Disposition: A | Discharge status: Home / Self Care | Payer: 59 | Source: Ambulatory Visit | Attending: Otolaryngology | Admitting: Otolaryngology

## 2011-09-05 ENCOUNTER — Other Ambulatory Visit (HOSPITAL_COMMUNITY): Payer: Self-pay | Admitting: Otolaryngology

## 2011-09-05 DIAGNOSIS — H9190 Unspecified hearing loss, unspecified ear: Secondary | ICD-10-CM | POA: Insufficient documentation

## 2011-09-05 DIAGNOSIS — I10 Essential (primary) hypertension: Secondary | ICD-10-CM | POA: Insufficient documentation

## 2011-09-05 DIAGNOSIS — H9192 Unspecified hearing loss, left ear: Secondary | ICD-10-CM

## 2011-09-05 DIAGNOSIS — C189 Malignant neoplasm of colon, unspecified: Secondary | ICD-10-CM | POA: Insufficient documentation

## 2011-09-05 DIAGNOSIS — E785 Hyperlipidemia, unspecified: Secondary | ICD-10-CM | POA: Insufficient documentation

## 2011-09-05 DIAGNOSIS — H0520 Unspecified exophthalmos: Secondary | ICD-10-CM | POA: Insufficient documentation

## 2011-09-05 DIAGNOSIS — G319 Degenerative disease of nervous system, unspecified: Secondary | ICD-10-CM | POA: Insufficient documentation

## 2011-09-05 DIAGNOSIS — E119 Type 2 diabetes mellitus without complications: Secondary | ICD-10-CM | POA: Insufficient documentation

## 2011-09-05 MED ORDER — GADOBENATE DIMEGLUMINE 529 MG/ML IV SOLN
20.0000 mL | Freq: Once | INTRAVENOUS | Status: AC | PRN
  Administered 2011-09-05: 20 mL via INTRAVENOUS

## 2011-09-07 ENCOUNTER — Other Ambulatory Visit: Payer: Self-pay | Admitting: Internal Medicine

## 2011-09-07 MED ORDER — OMEPRAZOLE 40 MG PO CPDR: 40.0000 mg | DELAYED_RELEASE_CAPSULE | Freq: Two times a day (BID) | ORAL | Status: DC

## 2011-09-07 NOTE — Telephone Encounter (Signed)
Rx refill sent to pharmacy. 

## 2011-09-20 ENCOUNTER — Encounter: Payer: Self-pay | Admitting: Internal Medicine

## 2011-09-21 ENCOUNTER — Other Ambulatory Visit: Payer: Self-pay | Admitting: *Deleted

## 2011-09-21 DIAGNOSIS — E119 Type 2 diabetes mellitus without complications: Secondary | ICD-10-CM

## 2011-09-21 MED ORDER — "INSULIN SYRINGE 31G X 5/16"" 1 ML MISC": Status: DC

## 2011-09-21 NOTE — Telephone Encounter (Signed)
My chart  Message received from patient requesting refill on insulin syringes.  Rx sent to pharmacy.

## 2011-09-30 ENCOUNTER — Encounter: Payer: Self-pay | Admitting: Internal Medicine

## 2011-10-01 ENCOUNTER — Other Ambulatory Visit: Payer: Self-pay | Admitting: Internal Medicine

## 2011-10-01 DIAGNOSIS — E785 Hyperlipidemia, unspecified: Secondary | ICD-10-CM

## 2011-10-01 DIAGNOSIS — E119 Type 2 diabetes mellitus without complications: Secondary | ICD-10-CM

## 2011-10-06 LAB — HEPATIC FUNCTION PANEL
ALT: 39 U/L (ref 0–53)
AST: 24 U/L (ref 0–37)
Bilirubin, Direct: 0.1 mg/dL (ref 0.0–0.3)
Indirect Bilirubin: 0.9 mg/dL (ref 0.0–0.9)
Total Protein: 7 g/dL (ref 6.0–8.3)

## 2011-10-06 LAB — BASIC METABOLIC PANEL
CO2: 23 mEq/L (ref 19–32)
Calcium: 9.1 mg/dL (ref 8.4–10.5)
Chloride: 102 mEq/L (ref 96–112)
Creat: 0.87 mg/dL (ref 0.50–1.35)
Sodium: 137 mEq/L (ref 135–145)

## 2011-10-06 LAB — LIPID PANEL
Cholesterol: 222 mg/dL — ABNORMAL HIGH (ref 0–200)
Total CHOL/HDL Ratio: 6.7 Ratio
Triglycerides: 539 mg/dL — ABNORMAL HIGH (ref <150)

## 2011-10-06 LAB — HEMOGLOBIN A1C: Mean Plasma Glucose: 166 mg/dL — ABNORMAL HIGH (ref <117)

## 2011-10-12 ENCOUNTER — Telehealth: Payer: Self-pay | Admitting: Internal Medicine

## 2011-10-12 ENCOUNTER — Telehealth: Payer: Self-pay | Admitting: *Deleted

## 2011-10-12 ENCOUNTER — Ambulatory Visit (INDEPENDENT_AMBULATORY_CARE_PROVIDER_SITE_OTHER): Payer: 59 | Admitting: Internal Medicine

## 2011-10-12 ENCOUNTER — Encounter: Payer: Self-pay | Admitting: Internal Medicine

## 2011-10-12 VITALS — BP 114/70 | HR 80 | Temp 97.6°F | Resp 18 | Ht 69.0 in | Wt 241.0 lb

## 2011-10-12 DIAGNOSIS — E785 Hyperlipidemia, unspecified: Secondary | ICD-10-CM

## 2011-10-12 DIAGNOSIS — G4700 Insomnia, unspecified: Secondary | ICD-10-CM

## 2011-10-12 DIAGNOSIS — E119 Type 2 diabetes mellitus without complications: Secondary | ICD-10-CM

## 2011-10-12 MED ORDER — OMEPRAZOLE 40 MG PO CPDR: 40.0000 mg | DELAYED_RELEASE_CAPSULE | Freq: Two times a day (BID) | ORAL | Status: DC

## 2011-10-12 MED ORDER — ZOLPIDEM TARTRATE 5 MG PO TABS: 5.0000 mg | ORAL_TABLET | Freq: Every evening | ORAL | Status: DC | PRN

## 2011-10-12 MED ORDER — ATORVASTATIN CALCIUM 10 MG PO TABS: 10.0000 mg | ORAL_TABLET | Freq: Every day | ORAL | Status: DC

## 2011-10-12 MED ORDER — FENOFIBRATE 160 MG PO TABS: 160.0000 mg | ORAL_TABLET | Freq: Every day | ORAL | Status: DC

## 2011-10-12 MED ORDER — LOSARTAN POTASSIUM 25 MG PO TABS: 25.0000 mg | ORAL_TABLET | Freq: Every day | ORAL | Status: DC

## 2011-10-12 NOTE — Assessment & Plan Note (Signed)
Dc trazodone. Attempt ambien prn

## 2011-10-12 NOTE — Telephone Encounter (Signed)
Lab order entered for June 2013. 

## 2011-10-12 NOTE — Progress Notes (Signed)
  Subjective:    Patient ID: [name], male    DOB: 30-Sep-1960, 51 y.o.   MRN: [id number]  HPI Pt presents to clinic for followup of multiple medical problems. States has been out of most of his medications as his pharmacy stated the prescriptions were in Dr. Olegario Messier name and they would not fill. No documentation of pharmacy contact for refill or questions for either our office or Dr. Olegario Messier. Pt states did not call the office either to clarify pharmacy issue. Is taking insulin missing some doses and not checking fsbs consistently. Admits to suboptimal diet but states he eats what his significant other buys/makes. Did not bring fsbs log. Some recent fsbs generally between 140-165 with 123 this am. No hypoglycemia. Reviewed a1c improved to 7.4. TG elevated off lipitor/fenofibrate. C/o chronic insomnia with suboptimal control from trazadone. There is intermittent delay of medication onset.   Past Medical History  Diagnosis Date  . Obesity   . Iron deficiency anemia 2011  . Psoriasis   . Hepatic artery stenosis   . Colon cancer     right colon cancer- adenocarcinoma CEA level isnrmal at 1.8  . Diabetes mellitus   . Hypertension   . Hyperlipidemia   . Hypertriglyceridemia 12/14/2010   Past Surgical History  Procedure Date  . Cholecystectomy 1994  . Hemicolectomy     12/17/2009 right  . Colonoscopy   . Polypectomy   . Carpal tunnel release     RIGHT    reports that he has quit smoking. He has never used smokeless tobacco. He reports that he does not drink alcohol or use illicit drugs. family history includes Diabetes in his brother; Diabetes type II in his brother; and Stomach cancer in his mother.  There is no history of Other, and Coronary artery disease, and Cancer, . No Known Allergies    Review of Systems see hpi     Objective:   Physical Exam  Physical Exam  Nursing note and vitals reviewed. Constitutional: Appears well-developed and well-nourished. No distress.  HENT:    Head: Normocephalic and atraumatic.  Right Ear: External ear normal.  Left Ear: External ear normal.  Eyes: Conjunctivae are normal. No scleral icterus.  Neck: Neck supple. Carotid bruit is not present.  Cardiovascular: Normal rate, regular rhythm and normal heart sounds.  Exam reveals no gallop and no friction rub.   No murmur heard. Pulmonary/Chest: Effort normal and breath sounds normal. No respiratory distress. He has no wheezes. no rales.  Lymphadenopathy:    He has no cervical adenopathy.  Neurological:Alert.  Skin: Skin is warm and dry. Not diaphoretic.  Psychiatric: Has a normal mood and affect.        Assessment & Plan:

## 2011-10-12 NOTE — Assessment & Plan Note (Signed)
Improving control. Attention to diabetic diet. Increase lantus dose 3 units q 3 days until fasting am glucose consistently less than 130 without hypoglycemia.

## 2011-10-12 NOTE — Telephone Encounter (Signed)
Opened in error

## 2011-10-12 NOTE — Assessment & Plan Note (Signed)
TG high off statin/fenofibrate. Instructed to contact clinic in future with any problems, questions or concerns regarding medication.

## 2011-10-12 NOTE — Patient Instructions (Signed)
Please schedule fasting labs prior to next appointment Cbc, chem7, a1c 250.0 and lipid/lft 272.4

## 2011-11-08 ENCOUNTER — Encounter: Payer: Self-pay | Admitting: Internal Medicine

## 2011-11-10 ENCOUNTER — Other Ambulatory Visit: Payer: Self-pay | Admitting: Internal Medicine

## 2011-11-10 DIAGNOSIS — M25569 Pain in unspecified knee: Secondary | ICD-10-CM

## 2011-11-12 ENCOUNTER — Ambulatory Visit (HOSPITAL_BASED_OUTPATIENT_CLINIC_OR_DEPARTMENT_OTHER)
Admission: RE | Admit: 2011-11-12 | Discharge: 2011-11-12 | Disposition: A | Discharge status: Home / Self Care | Payer: 59 | Source: Ambulatory Visit | Attending: Internal Medicine | Admitting: Internal Medicine

## 2011-11-12 DIAGNOSIS — M25569 Pain in unspecified knee: Secondary | ICD-10-CM

## 2011-11-12 DIAGNOSIS — M898X9 Other specified disorders of bone, unspecified site: Secondary | ICD-10-CM

## 2011-11-17 ENCOUNTER — Encounter: Payer: Self-pay | Admitting: Internal Medicine

## 2011-11-17 ENCOUNTER — Ambulatory Visit (INDEPENDENT_AMBULATORY_CARE_PROVIDER_SITE_OTHER): Payer: 59 | Admitting: Internal Medicine

## 2011-11-17 VITALS — BP 130/72 | HR 91 | Temp 98.0°F | Resp 20 | Ht 69.0 in | Wt 240.0 lb

## 2011-11-17 DIAGNOSIS — M25561 Pain in right knee: Secondary | ICD-10-CM

## 2011-11-17 DIAGNOSIS — M25569 Pain in unspecified knee: Secondary | ICD-10-CM

## 2011-11-17 HISTORY — DX: Pain in right knee: M25.561

## 2011-11-17 MED ORDER — DICLOFENAC SODIUM 75 MG PO TBEC: DELAYED_RELEASE_TABLET | ORAL | Status: DC

## 2011-11-17 NOTE — Assessment & Plan Note (Signed)
Consider prepatellar bursitis vs patellar tendonitis. Attempt voltaren with food and no other nsaids. Followup if no improvement or worsening.

## 2011-11-17 NOTE — Progress Notes (Signed)
  Subjective:    Patient ID: [name], male    DOB: 19-May-1961, 51 y.o.   MRN: [id number]  HPI Pt presents to clinic for evaluation of knee pain. Notes chronic intermittent right knee pain located patellar area. No instability/buckling, injury or trauma. Taking no medication for the problem. Pain occurs with direct pressure to knee cap. Reviewed knee xray with mild degenerative changes present.  See hpi  Review of Systems see hpi     Objective:   Physical Exam  Nursing note and vitals reviewed. Constitutional: He appears well-developed and well-nourished. No distress.  HENT:  Head: Normocephalic and atraumatic.  Musculoskeletal:       Right knee: FROM, no erythema warmth or effusion. +mild tenderness mid patellar and ? Inferior patellar tendon.  Neurological: He is alert.  Skin: Skin is warm and dry. He is not diaphoretic.  Psychiatric: He has a normal mood and affect.          Assessment & Plan:

## 2012-01-04 ENCOUNTER — Other Ambulatory Visit: Payer: Self-pay | Admitting: *Deleted

## 2012-01-04 ENCOUNTER — Encounter: Payer: Self-pay | Admitting: Internal Medicine

## 2012-01-04 DIAGNOSIS — E785 Hyperlipidemia, unspecified: Secondary | ICD-10-CM

## 2012-01-04 DIAGNOSIS — E119 Type 2 diabetes mellitus without complications: Secondary | ICD-10-CM

## 2012-01-06 LAB — BASIC METABOLIC PANEL
Calcium: 8.9 mg/dL (ref 8.4–10.5)
Creat: 0.87 mg/dL (ref 0.50–1.35)
Sodium: 137 mEq/L (ref 135–145)

## 2012-01-06 LAB — CBC
MCH: 30.7 pg (ref 26.0–34.0)
MCV: 86.6 fL (ref 78.0–100.0)
Platelets: 287 10*3/uL (ref 150–400)
RDW: 13 % (ref 11.5–15.5)

## 2012-01-06 LAB — HEPATIC FUNCTION PANEL
ALT: 50 U/L (ref 0–53)
AST: 32 U/L (ref 0–37)
Alkaline Phosphatase: 64 U/L (ref 39–117)
Bilirubin, Direct: 0.1 mg/dL (ref 0.0–0.3)
Indirect Bilirubin: 0.6 mg/dL (ref 0.0–0.9)

## 2012-01-06 LAB — LIPID PANEL: Cholesterol: 189 mg/dL (ref 0–200)

## 2012-01-07 LAB — HEMOGLOBIN A1C
Hgb A1c MFr Bld: 7.7 % — ABNORMAL HIGH (ref <5.7)
Mean Plasma Glucose: 174 mg/dL — ABNORMAL HIGH (ref <117)

## 2012-01-10 ENCOUNTER — Telehealth: Payer: Self-pay | Admitting: Internal Medicine

## 2012-01-10 ENCOUNTER — Ambulatory Visit (INDEPENDENT_AMBULATORY_CARE_PROVIDER_SITE_OTHER): Payer: 59 | Admitting: Internal Medicine

## 2012-01-10 ENCOUNTER — Encounter: Payer: Self-pay | Admitting: Internal Medicine

## 2012-01-10 VITALS — BP 132/80 | HR 93 | Temp 97.9°F | Resp 20 | Ht 69.0 in | Wt 244.0 lb

## 2012-01-10 DIAGNOSIS — E119 Type 2 diabetes mellitus without complications: Secondary | ICD-10-CM

## 2012-01-10 DIAGNOSIS — E785 Hyperlipidemia, unspecified: Secondary | ICD-10-CM

## 2012-01-10 MED ORDER — OMEGA-3-ACID ETHYL ESTERS 1 G PO CAPS: 2.0000 g | ORAL_CAPSULE | Freq: Two times a day (BID) | ORAL | Status: DC

## 2012-01-10 MED ORDER — DICLOFENAC SODIUM 75 MG PO TBEC: 75.0000 mg | DELAYED_RELEASE_TABLET | Freq: Two times a day (BID) | ORAL | Status: DC | PRN

## 2012-01-10 NOTE — Progress Notes (Signed)
  Subjective:    Patient ID: [name], male    DOB: Dec 16, 1960, 51 y.o.   MRN: [id number]  HPI Pt presents to clinic for followup of multiple medical problems. Knee bursitis improving with voltaren but ran out. No gi adverse effect. Not checking fsbs. Eye exam utd. Reviewed TG persistently elevated.  Past Medical History  Diagnosis Date  . Obesity   . Iron deficiency anemia 2011  . Psoriasis   . Hepatic artery stenosis   . Colon cancer     right colon cancer- adenocarcinoma CEA level isnrmal at 1.8  . Diabetes mellitus   . Hypertension   . Hyperlipidemia   . Hypertriglyceridemia 12/14/2010   Past Surgical History  Procedure Date  . Cholecystectomy 1994  . Hemicolectomy     12/17/2009 right  . Colonoscopy   . Polypectomy   . Carpal tunnel release     RIGHT    reports that he has quit smoking. He has never used smokeless tobacco. He reports that he does not drink alcohol or use illicit drugs. family history includes Diabetes in his brother; Diabetes type II in his brother; and Stomach cancer in his mother.  There is no history of Other, and Coronary artery disease, and Cancer, . No Known Allergies    Review of Systems see hpi     Objective:   Physical Exam  Physical Exam  Nursing note and vitals reviewed. Constitutional: Appears well-developed and well-nourished. No distress.  HENT:  Head: Normocephalic and atraumatic.  Right Ear: External ear normal.  Left Ear: External ear normal.  Eyes: Conjunctivae are normal. No scleral icterus.  Neck: Neck supple. Carotid bruit is not present.  Cardiovascular: Normal rate, regular rhythm and normal heart sounds.  Exam reveals no gallop and no friction rub.   No murmur heard. Pulmonary/Chest: Effort normal and breath sounds normal. No respiratory distress. He has no wheezes. no rales.  Lymphadenopathy:    He has no cervical adenopathy.  Neurological:Alert.  Skin: Skin is warm and dry. Not diaphoretic.  Psychiatric: Has a  normal mood and affect.  Diabetic foot exam: +2 DP pulses, no diabetic wounds, ulcerations or significant callousing. Monofilament exam nl.       Assessment & Plan:

## 2012-01-10 NOTE — Telephone Encounter (Signed)
Lab orders entered for August 2013.

## 2012-01-10 NOTE — Patient Instructions (Signed)
Please schedule fasting labs prior to next visit Chem7, a1c-250 and lipid, direct ldl 272.4

## 2012-01-11 ENCOUNTER — Encounter: Payer: Self-pay | Admitting: Internal Medicine

## 2012-01-15 NOTE — Assessment & Plan Note (Signed)
Begin lovaza.

## 2012-01-15 NOTE — Assessment & Plan Note (Signed)
Reinforced need for diabetic diet, exercise, weight loss and daily fsbs monitoring. States understanding.

## 2012-01-19 ENCOUNTER — Encounter: Payer: Self-pay | Admitting: Internal Medicine

## 2012-01-19 NOTE — Telephone Encounter (Signed)
Called [phone] ([facility]) and requested prior authorization form.

## 2012-01-25 ENCOUNTER — Telehealth: Payer: Self-pay | Admitting: *Deleted

## 2012-01-25 NOTE — Telephone Encounter (Signed)
Received fax from [facility] as pt is requesting prior authorization from insurance. Form initiated and forwarded to Provider for completion and signature.

## 2012-01-25 NOTE — Telephone Encounter (Signed)
Form completed, signed and faxed to [phone].

## 2012-02-01 NOTE — Telephone Encounter (Signed)
Received approval good until 01/24/13. Notified pharmacy, rx has already been picked up by patient.

## 2012-03-10 LAB — LIPID PANEL
HDL: 25 mg/dL — ABNORMAL LOW (ref >39)
LDL Cholesterol: 55 mg/dL (ref 0–99)

## 2012-03-10 LAB — BASIC METABOLIC PANEL
BUN: 13 mg/dL (ref 6–23)
Creat: 0.79 mg/dL (ref 0.50–1.35)
Glucose, Bld: 110 mg/dL — ABNORMAL HIGH (ref 70–99)
Potassium: 4.1 mEq/L (ref 3.5–5.3)

## 2012-03-10 LAB — LDL CHOLESTEROL, DIRECT: Direct LDL: 63 mg/dL

## 2012-03-10 LAB — HEMOGLOBIN A1C: Hgb A1c MFr Bld: 7.5 % — ABNORMAL HIGH (ref <5.7)

## 2012-03-10 NOTE — Telephone Encounter (Signed)
Lab orders released/SLS 

## 2012-03-10 NOTE — Addendum Note (Signed)
Addended by: Regis Bill on: 03/10/2012 02:07 PM   Modules accepted: Orders

## 2012-03-13 ENCOUNTER — Encounter: Payer: Self-pay | Admitting: Internal Medicine

## 2012-03-13 ENCOUNTER — Telehealth: Payer: Self-pay | Admitting: Internal Medicine

## 2012-03-13 ENCOUNTER — Ambulatory Visit (INDEPENDENT_AMBULATORY_CARE_PROVIDER_SITE_OTHER): Payer: 59 | Admitting: Internal Medicine

## 2012-03-13 VITALS — BP 128/82 | HR 92 | Temp 98.4°F | Resp 18 | Ht 69.0 in | Wt 247.8 lb

## 2012-03-13 DIAGNOSIS — E785 Hyperlipidemia, unspecified: Secondary | ICD-10-CM

## 2012-03-13 DIAGNOSIS — E119 Type 2 diabetes mellitus without complications: Secondary | ICD-10-CM

## 2012-03-13 DIAGNOSIS — E781 Pure hyperglyceridemia: Secondary | ICD-10-CM

## 2012-03-13 NOTE — Patient Instructions (Signed)
Please schedule fasting labs prior to next visit Cbc, chem7, a1c, urine microalbumin-250.00 and lipid/lft-272.4 

## 2012-03-13 NOTE — Assessment & Plan Note (Signed)
suboptimal control. Encouraged to follow recommendations. Resume fsbs.

## 2012-03-13 NOTE — Progress Notes (Signed)
  Subjective:    Patient ID: [name], male    DOB: Apr 28, 1961, 51 y.o.   MRN: [id number]  HPI Pt presents to clinic for followup of multiple medical problems. States is not checking fsbs and does not take lovaza on a regular basis. Weight reviewed up from last visit. TG reviewed improved and A1c mildly improved. States he can do better and knows what he needs to do but just needs to do it.  Past Medical History  Diagnosis Date  . Obesity   . Iron deficiency anemia 2011  . Psoriasis   . Hepatic artery stenosis   . Colon cancer     right colon cancer- adenocarcinoma CEA level isnrmal at 1.8  . Diabetes mellitus   . Hypertension   . Hyperlipidemia   . Hypertriglyceridemia 12/14/2010   Past Surgical History  Procedure Date  . Cholecystectomy 1994  . Hemicolectomy     12/17/2009 right  . Colonoscopy   . Polypectomy   . Carpal tunnel release     RIGHT    reports that he has quit smoking. He has never used smokeless tobacco. He reports that he does not drink alcohol or use illicit drugs. family history includes Diabetes in his brother; Diabetes type II in his brother; and Stomach cancer in his mother.  There is no history of Other, and Coronary artery disease, and Cancer, . No Known Allergies    Review of Systems see hpi     Objective:   Physical Exam  Physical Exam  Nursing note and vitals reviewed. Constitutional: Appears well-developed and well-nourished. No distress.  HENT:  Head: Normocephalic and atraumatic.  Right Ear: External ear normal.  Left Ear: External ear normal.  Eyes: Conjunctivae are normal. No scleral icterus.  Neck: Neck supple. Carotid bruit is not present.  Cardiovascular: Normal rate, regular rhythm and normal heart sounds.  Exam reveals no gallop and no friction rub.   No murmur heard. Pulmonary/Chest: Effort normal and breath sounds normal. No respiratory distress. He has no wheezes. no rales.  Lymphadenopathy:    He has no cervical  adenopathy.  Neurological:Alert.  Skin: Skin is warm and dry. Not diaphoretic.  Psychiatric: Has a normal mood and affect.        Assessment & Plan:

## 2012-03-13 NOTE — Assessment & Plan Note (Signed)
Improved despite noncompliance. Take lovaza daily.

## 2012-03-16 ENCOUNTER — Telehealth: Payer: Self-pay | Admitting: Internal Medicine

## 2012-03-16 MED ORDER — METFORMIN HCL 1000 MG PO TABS: 1000.0000 mg | ORAL_TABLET | Freq: Two times a day (BID) | ORAL | Status: DC

## 2012-03-16 NOTE — Telephone Encounter (Signed)
Refill- metformin 1000mg  tablets. Take one tablet by mouth twice daily with meals. Qty 60 last fill 7.31.13

## 2012-03-16 NOTE — Telephone Encounter (Signed)
Rx Done/SLS 

## 2012-03-23 ENCOUNTER — Ambulatory Visit: Payer: 59 | Admitting: Internal Medicine

## 2012-03-23 NOTE — Telephone Encounter (Signed)
Please schedule fasting labs prior to next visit  Cbc, chem7, a1c, urine microalbumin-250.00 and lipid/lft-272.4  Future orders placed and copy given to the lab.

## 2012-05-01 ENCOUNTER — Encounter: Payer: Self-pay | Admitting: Internal Medicine

## 2012-05-03 ENCOUNTER — Other Ambulatory Visit: Payer: Self-pay | Admitting: Internal Medicine

## 2012-05-03 MED ORDER — ZOLPIDEM TARTRATE 5 MG PO TABS: 5.0000 mg | ORAL_TABLET | Freq: Every evening | ORAL | Status: DC | PRN

## 2012-05-03 NOTE — Telephone Encounter (Signed)
Refills last sent 10/12/11 #30 x 1 refill. Please advise how many refills to give pt?

## 2012-05-05 ENCOUNTER — Telehealth: Payer: Self-pay | Admitting: Internal Medicine

## 2012-05-05 NOTE — Telephone Encounter (Signed)
AMBIEN  5 MG TAKE 1 AT BEDTIME PRN QTY 30 AND NEEDS NEW RX WITH REFILLS  HE WAS ALSO WANTING A STRONGER DOSE OF THIS MED

## 2012-05-05 NOTE — Telephone Encounter (Signed)
Pt presented to the office and states [facility] did not receive our previous Rx.  Spoke to Golf at [place], he states they never received ambien refill from 05/03/12. Verbal given #30 x 1 refill. Pt aware.

## 2012-05-19 ENCOUNTER — Telehealth: Payer: Self-pay | Admitting: Internal Medicine

## 2012-05-19 MED ORDER — FENOFIBRATE 160 MG PO TABS: 160.0000 mg | ORAL_TABLET | Freq: Every day | ORAL | Status: DC

## 2012-05-19 MED ORDER — LOSARTAN POTASSIUM 25 MG PO TABS: 25.0000 mg | ORAL_TABLET | Freq: Every day | ORAL | Status: DC

## 2012-05-19 NOTE — Telephone Encounter (Signed)
Request refill for Atorvastatin 10 mg, D/C from pt's med list 05.01.13; pt reported had stopped taking; pt was to begin taking Lovaza daily as instructed at 08.26.13 OV/SLS Please advise.

## 2012-05-19 NOTE — Telephone Encounter (Signed)
Refill- fenofibrate 160mg  tablets. Take one tablet by mouth daily. Qty 30 last fill 10.6.13  Refill- atorvastatin 10mg  tablets. Take one tablet by mouth daily. Qty 30 last fill 10.6.13  Refill- losartan 25mg  tablets. Take one tablet by mouth daily. Qty 30 last fill 10.6.13

## 2012-05-21 NOTE — Telephone Encounter (Signed)
#  30  rf 6 

## 2012-05-22 MED ORDER — ATORVASTATIN CALCIUM 10 MG PO TABS: 10.0000 mg | ORAL_TABLET | Freq: Every day | ORAL | Status: DC

## 2012-05-22 NOTE — Telephone Encounter (Signed)
Rx to pharmacy/SLS 

## 2012-06-19 LAB — LIPID PANEL
Cholesterol: 160 mg/dL (ref 0–200)
HDL: 32 mg/dL — ABNORMAL LOW (ref >39)
LDL Cholesterol: 56 mg/dL (ref 0–99)
Total CHOL/HDL Ratio: 5 Ratio
Triglycerides: 359 mg/dL — ABNORMAL HIGH (ref <150)
VLDL: 72 mg/dL — ABNORMAL HIGH (ref 0–40)

## 2012-06-19 LAB — BASIC METABOLIC PANEL
BUN: 15 mg/dL (ref 6–23)
Chloride: 103 mEq/L (ref 96–112)
Glucose, Bld: 173 mg/dL — ABNORMAL HIGH (ref 70–99)
Potassium: 4.2 mEq/L (ref 3.5–5.3)
Sodium: 136 mEq/L (ref 135–145)

## 2012-06-19 LAB — CBC WITH DIFFERENTIAL/PLATELET
Basophils Relative: 1 % (ref 0–1)
HCT: 40.6 % (ref 39.0–52.0)
Hemoglobin: 14.7 g/dL (ref 13.0–17.0)
Lymphs Abs: 3.3 10*3/uL (ref 0.7–4.0)
MCHC: 36.2 g/dL — ABNORMAL HIGH (ref 30.0–36.0)
Monocytes Absolute: 0.8 10*3/uL (ref 0.1–1.0)
Monocytes Relative: 10 % (ref 3–12)
Neutro Abs: 3.9 10*3/uL (ref 1.7–7.7)
RBC: 4.66 MIL/uL (ref 4.22–5.81)

## 2012-06-19 LAB — HEPATIC FUNCTION PANEL
Bilirubin, Direct: 0.1 mg/dL (ref 0.0–0.3)
Indirect Bilirubin: 0.5 mg/dL (ref 0.0–0.9)
Total Bilirubin: 0.6 mg/dL (ref 0.3–1.2)

## 2012-06-19 LAB — HEMOGLOBIN A1C: Mean Plasma Glucose: 212 mg/dL — ABNORMAL HIGH (ref <117)

## 2012-06-19 NOTE — Telephone Encounter (Signed)
Lab orders released/SLS 

## 2012-06-19 NOTE — Addendum Note (Signed)
Addended by: Regis Bill on: 06/19/2012 11:55 AM   Modules accepted: Orders

## 2012-06-20 LAB — MICROALBUMIN / CREATININE URINE RATIO
Creatinine, Urine: 138.4 mg/dL
Microalb Creat Ratio: 4.3 mg/g (ref 0.0–30.0)
Microalb, Ur: 0.6 mg/dL (ref 0.00–1.89)

## 2012-06-21 ENCOUNTER — Encounter: Payer: Self-pay | Admitting: Internal Medicine

## 2012-06-21 ENCOUNTER — Ambulatory Visit (INDEPENDENT_AMBULATORY_CARE_PROVIDER_SITE_OTHER): Payer: 59 | Admitting: Internal Medicine

## 2012-06-21 ENCOUNTER — Telehealth: Payer: Self-pay | Admitting: Internal Medicine

## 2012-06-21 VITALS — BP 122/78 | HR 93 | Temp 98.3°F | Resp 16 | Wt 245.8 lb

## 2012-06-21 DIAGNOSIS — E119 Type 2 diabetes mellitus without complications: Secondary | ICD-10-CM

## 2012-06-21 MED ORDER — INSULIN GLARGINE 100 UNIT/ML ~~LOC~~ SOLN: 60.0000 [IU] | Freq: Every day | SUBCUTANEOUS | Status: DC

## 2012-06-21 MED ORDER — ZOLPIDEM TARTRATE 5 MG PO TABS: 5.0000 mg | ORAL_TABLET | Freq: Every evening | ORAL | Status: DC | PRN

## 2012-06-21 NOTE — Patient Instructions (Signed)
Please schedule fasting labs prior to next visit Cbc, chem7, a1c, lipid, lft, tsh, psa, ua with reflex micro and testosterone-v70.0

## 2012-06-21 NOTE — Telephone Encounter (Signed)
Please schedule fasting labs prior to next visit  Cbc, chem7, a1c, lipid, lft, tsh, psa, ua with reflex micro and testosterone-v70.0  Patient has upcoming appointment in March/2014. He will be going to [facility] lab

## 2012-06-22 ENCOUNTER — Ambulatory Visit: Payer: 59 | Admitting: Internal Medicine

## 2012-06-24 NOTE — Assessment & Plan Note (Signed)
suboptimal control. Advise appropriate diabetic diet, exercise and weight loss. Change insulin to pens. Resume metformin at 500mg  tid.

## 2012-06-24 NOTE — Progress Notes (Signed)
  Subjective:    Patient ID: [name], male    DOB: [birth year], 51 y.o.   MRN: [id number]  HPI Pt presents to clinic for followup of multiple medical problems. Reviewed worsening a1c. States dietary indiscretion. Misses insulin periodically and feels would be more compliant with insulin pens. Also not taking metformin due to ha's. Total time of visit ~32 minutes of which >50% of time spent in counseling. Past Medical History  Diagnosis Date  . Obesity   . Iron deficiency anemia 2011  . Psoriasis   . Hepatic artery stenosis   . Colon cancer     right colon cancer- adenocarcinoma CEA level isnrmal at 1.8  . Diabetes mellitus   . Hypertension   . Hyperlipidemia   . Hypertriglyceridemia 12/14/2010   Past Surgical History  Procedure Date  . Cholecystectomy 1994  . Hemicolectomy     12/17/2009 right  . Colonoscopy   . Polypectomy   . Carpal tunnel release     RIGHT    reports that he has quit smoking. He has never used smokeless tobacco. He reports that he does not drink alcohol or use illicit drugs. family history includes Diabetes in his brother; Diabetes type II in his brother; and Stomach cancer in his mother.  There is no history of Other, and Coronary artery disease, and Cancer, . No Known Allergies    Review of Systems see hpi     Objective:   Physical Exam  Nursing note and vitals reviewed. Constitutional: He appears well-developed and well-nourished. No distress.  Skin: He is not diaphoretic.         Assessment & Plan:

## 2012-07-11 ENCOUNTER — Telehealth: Payer: Self-pay | Admitting: Internal Medicine

## 2012-07-11 MED ORDER — GLUCOSE BLOOD VI STRP: ORAL_STRIP | Status: DC

## 2012-07-11 NOTE — Telephone Encounter (Signed)
Rx to pharmacy/SLS 

## 2012-07-11 NOTE — Telephone Encounter (Signed)
Refill one touch ultra blue test str 25s use to check blood sugar once a day qty 100 lasat fill 06-19-2012

## 2012-07-17 ENCOUNTER — Telehealth: Payer: Self-pay | Admitting: Internal Medicine

## 2012-07-17 MED ORDER — ZOLPIDEM TARTRATE 5 MG PO TABS: 5.0000 mg | ORAL_TABLET | Freq: Every evening | ORAL | Status: DC | PRN

## 2012-07-17 MED ORDER — OMEPRAZOLE 40 MG PO CPDR: 40.0000 mg | DELAYED_RELEASE_CAPSULE | Freq: Two times a day (BID) | ORAL | Status: DC

## 2012-07-17 NOTE — Telephone Encounter (Signed)
Omeprazole refill sent to pharmacy. Advised pt that he should still have 1 refill of zolpidem from Rx that was given to him at his 06/21/12 visit. Pt states that he thinks he may have thrown Rx away accidentally as he states he did not get rx filled. Pt states that he will check his vehicle and will let us know if he finds Rx. Reports that he is trying to get refill in before the end of the year and if we can't send refill in by tomorrow to disregard request and he will discuss refills at his next appt.

## 2012-07-17 NOTE — Telephone Encounter (Signed)
Refill left on pharmacy voicemail. Notified pt, he reports that he found written Rx. Advised pt to give to pharmacy to place on file for future refills. Pt voices understanding.

## 2012-07-17 NOTE — Telephone Encounter (Signed)
Patient is requesting a refill of zolpidem and omeprazole to be sent to [facility] [address] in [place]. He would like this sent in by 5pm today.

## 2012-07-17 NOTE — Telephone Encounter (Signed)
Ok to fill 

## 2012-08-01 ENCOUNTER — Telehealth: Payer: Self-pay | Admitting: Internal Medicine

## 2012-08-01 NOTE — Telephone Encounter (Signed)
Patient has upcoming cpe on 10/05/12 and wants to make sure lab orders are in.

## 2012-09-02 ENCOUNTER — Other Ambulatory Visit: Payer: Self-pay

## 2012-09-22 ENCOUNTER — Encounter: Payer: 59 | Admitting: Internal Medicine

## 2012-10-02 ENCOUNTER — Telehealth: Payer: Self-pay | Admitting: *Deleted

## 2012-10-02 DIAGNOSIS — Z0000 Encounter for general adult medical examination without abnormal findings: Secondary | ICD-10-CM

## 2012-10-02 LAB — BASIC METABOLIC PANEL
CO2: 25 mEq/L (ref 19–32)
Chloride: 105 mEq/L (ref 96–112)
Creat: 0.71 mg/dL (ref 0.50–1.35)
Potassium: 4.4 mEq/L (ref 3.5–5.3)

## 2012-10-02 LAB — CBC WITH DIFFERENTIAL/PLATELET
Basophils Absolute: 0 10*3/uL (ref 0.0–0.1)
Eosinophils Relative: 4 % (ref 0–5)
Lymphocytes Relative: 35 % (ref 12–46)
Lymphs Abs: 2.2 10*3/uL (ref 0.7–4.0)
MCV: 91.2 fL (ref 78.0–100.0)
Neutro Abs: 3.1 10*3/uL (ref 1.7–7.7)
Neutrophils Relative %: 49 % (ref 43–77)
Platelets: 278 10*3/uL (ref 150–400)
RBC: 4.22 MIL/uL (ref 4.22–5.81)
RDW: 12.8 % (ref 11.5–15.5)
WBC: 6.3 10*3/uL (ref 4.0–10.5)

## 2012-10-02 LAB — HEPATIC FUNCTION PANEL
AST: 23 U/L (ref 0–37)
Alkaline Phosphatase: 43 U/L (ref 39–117)
Bilirubin, Direct: 0.2 mg/dL (ref 0.0–0.3)
Indirect Bilirubin: 0.7 mg/dL (ref 0.0–0.9)
Total Bilirubin: 0.9 mg/dL (ref 0.3–1.2)

## 2012-10-02 LAB — LIPID PANEL
HDL: 27 mg/dL — ABNORMAL LOW (ref >39)
LDL Cholesterol: 45 mg/dL (ref 0–99)
Total CHOL/HDL Ratio: 3.6 Ratio
VLDL: 25 mg/dL (ref 0–40)

## 2012-10-02 NOTE — Telephone Encounter (Signed)
Pt presented to the lab. Orders entered per 06/2012 office note as below:  Please schedule fasting labs prior to next visit  Cbc, chem7, a1c, lipid, lft, tsh, psa, ua with reflex micro and testosterone-v70.0

## 2012-10-03 LAB — URINALYSIS, ROUTINE W REFLEX MICROSCOPIC
Glucose, UA: 100 mg/dL — AB
Hgb urine dipstick: NEGATIVE
Leukocytes, UA: NEGATIVE
Protein, ur: NEGATIVE mg/dL
Urobilinogen, UA: 0.2 mg/dL (ref 0.0–1.0)

## 2012-10-03 LAB — HEMOGLOBIN A1C: Mean Plasma Glucose: 154 mg/dL — ABNORMAL HIGH (ref <117)

## 2012-10-03 LAB — TSH: TSH: 2.596 u[IU]/mL (ref 0.350–4.500)

## 2012-10-05 ENCOUNTER — Telehealth: Payer: Self-pay | Admitting: Internal Medicine

## 2012-10-05 ENCOUNTER — Encounter: Payer: Self-pay | Admitting: Family Medicine

## 2012-10-05 ENCOUNTER — Ambulatory Visit (INDEPENDENT_AMBULATORY_CARE_PROVIDER_SITE_OTHER): Payer: 59 | Admitting: Family Medicine

## 2012-10-05 ENCOUNTER — Other Ambulatory Visit: Payer: Self-pay | Admitting: Family Medicine

## 2012-10-05 VITALS — BP 140/72 | HR 77 | Temp 97.4°F | Ht 69.0 in | Wt 229.0 lb

## 2012-10-05 DIAGNOSIS — E119 Type 2 diabetes mellitus without complications: Secondary | ICD-10-CM

## 2012-10-05 DIAGNOSIS — Z0000 Encounter for general adult medical examination without abnormal findings: Secondary | ICD-10-CM

## 2012-10-05 DIAGNOSIS — D508 Other iron deficiency anemias: Secondary | ICD-10-CM

## 2012-10-05 DIAGNOSIS — E785 Hyperlipidemia, unspecified: Secondary | ICD-10-CM

## 2012-10-05 DIAGNOSIS — C189 Malignant neoplasm of colon, unspecified: Secondary | ICD-10-CM

## 2012-10-05 DIAGNOSIS — E669 Obesity, unspecified: Secondary | ICD-10-CM

## 2012-10-05 DIAGNOSIS — K7689 Other specified diseases of liver: Secondary | ICD-10-CM

## 2012-10-05 DIAGNOSIS — G4700 Insomnia, unspecified: Secondary | ICD-10-CM

## 2012-10-05 DIAGNOSIS — E781 Pure hyperglyceridemia: Secondary | ICD-10-CM

## 2012-10-05 DIAGNOSIS — L408 Other psoriasis: Secondary | ICD-10-CM

## 2012-10-05 MED ORDER — ZOLPIDEM TARTRATE 10 MG PO TABS: 10.0000 mg | ORAL_TABLET | Freq: Every evening | ORAL | Status: DC | PRN

## 2012-10-05 MED ORDER — METFORMIN HCL 1000 MG PO TABS: 1000.0000 mg | ORAL_TABLET | Freq: Two times a day (BID) | ORAL | Status: DC

## 2012-10-05 NOTE — Assessment & Plan Note (Signed)
Over 20 pound weight loss since starting new diet, exercise regimen

## 2012-10-05 NOTE — Telephone Encounter (Signed)
Patient states that he dropped paperwork off for Dr. Abner Greenspan to fill out. Dr. Abner Greenspan told him that paperwork would be ready by Monday. He will pick up paperwork then.

## 2012-10-05 NOTE — Patient Instructions (Addendum)
Labs prior to visit, hgba1c, testosterone, lipid, renal, cbc, tsh, hepatic, microalb, psa  DASH Diet The DASH diet stands for "Dietary Approaches to Stop Hypertension." It is a healthy eating plan that has been shown to reduce high blood pressure (hypertension) in as little as 14 days, while also possibly providing other significant health benefits. These other health benefits include reducing the risk of breast cancer after menopause and reducing the risk of type 2 diabetes, heart disease, colon cancer, and stroke. Health benefits also include weight loss and slowing kidney failure in patients with chronic kidney disease.  DIET GUIDELINES  Limit salt (sodium). Your diet should contain less than 1500 mg of sodium daily.  Limit refined or processed carbohydrates. Your diet should include mostly whole grains. Desserts and added sugars should be used sparingly.  Include small amounts of heart-healthy fats. These types of fats include nuts, oils, and tub margarine. Limit saturated and trans fats. These fats have been shown to be harmful in the body. CHOOSING FOODS  The following food groups are based on a 2000 calorie diet. See your Registered Dietitian for individual calorie needs. Grains and Grain Products (6 to 8 servings daily)  Eat More Often: Whole-wheat bread, brown rice, whole-grain or wheat pasta, quinoa, popcorn without added fat or salt (air popped).  Eat Less Often: White bread, white pasta, white rice, cornbread. Vegetables (4 to 5 servings daily)  Eat More Often: Fresh, frozen, and canned vegetables. Vegetables may be raw, steamed, roasted, or grilled with a minimal amount of fat.  Eat Less Often/Avoid: Creamed or fried vegetables. Vegetables in a cheese sauce. Fruit (4 to 5 servings daily)  Eat More Often: All fresh, canned (in natural juice), or frozen fruits. Dried fruits without added sugar. One hundred percent fruit juice ( cup [237 mL] daily).  Eat Less Often: Dried fruits  with added sugar. Canned fruit in light or heavy syrup. [employer], Fish, and Poultry (2 servings or less daily. One serving is 3 to 4 oz [85-114 g]).  Eat More Often: Ninety percent or leaner ground beef, tenderloin, sirloin. Round cuts of beef, chicken breast, [place] breast. All fish. Grill, bake, or broil your meat. Nothing should be fried.  Eat Less Often/Avoid: Fatty cuts of meat, [place], or chicken leg, thigh, or wing. Fried cuts of meat or fish. Dairy (2 to 3 servings)  Eat More Often: Low-fat or fat-free milk, low-fat plain or light yogurt, reduced-fat or part-skim cheese.  Eat Less Often/Avoid: Milk (whole, 2%).Whole milk yogurt. Full-fat cheeses. Nuts, Seeds, and Legumes (4 to 5 servings per week)  Eat More Often: All without added salt.  Eat Less Often/Avoid: Salted nuts and seeds, canned beans with added salt. Fats and Sweets (limited)  Eat More Often: Vegetable oils, tub margarines without trans fats, sugar-free gelatin. Mayonnaise and salad dressings.  Eat Less Often/Avoid: Coconut oils, palm oils, butter, stick margarine, cream, half and half, cookies, candy, pie. FOR MORE INFORMATION The Dash Diet Eating Plan: www.dashdiet.org Document Released: 06/24/2011 Document Revised: 09/27/2011 Document Reviewed: 06/24/2011 [facility] Patient Information 2013 [place], [place].

## 2012-10-05 NOTE — Telephone Encounter (Signed)
Labs prior to visit, hgba1c, testosterone, lipid, renal, cbc, tsh, hepatic, microalb, psa  Patient is coming in in June. He will be going to [facility]

## 2012-10-05 NOTE — Assessment & Plan Note (Signed)
Using light box, Embrel. Doing well

## 2012-10-05 NOTE — Assessment & Plan Note (Signed)
Improved control with lifestyle changes including exercising at a gym regularly, cutting out fast food and eating less carbohydrates. Is not using any Lantus. Given refill on Metformin today

## 2012-10-05 NOTE — Assessment & Plan Note (Signed)
Doing well, no recent trouble bowels moving regularly due for repeat colonoscopy in 2015

## 2012-10-08 ENCOUNTER — Encounter: Payer: Self-pay | Admitting: Family Medicine

## 2012-10-08 DIAGNOSIS — Z0000 Encounter for general adult medical examination without abnormal findings: Secondary | ICD-10-CM | POA: Insufficient documentation

## 2012-10-08 HISTORY — DX: Encounter for general adult medical examination without abnormal findings: Z00.00

## 2012-10-08 NOTE — Assessment & Plan Note (Signed)
Mild encouraged increased iron in diet. Had last colonoscopy in 2012. No change in bowel habits.

## 2012-10-08 NOTE — Assessment & Plan Note (Signed)
Encouraged DASH diet and regular exercise. Encouraged 8 hours of sleep nightly.

## 2012-10-08 NOTE — Progress Notes (Signed)
Patient ID: [name], male   DOB: 1961-04-10, 52 y.o.   MRN: [id number] [name] [id number] 12/22/1960 10/08/2012      Progress Note New Patient  Subjective  Chief Complaint  Chief Complaint  Patient presents with  . Annual Exam    physical    HPI  Patient is a 52 year old Caucasian male who is in today for annual exam. Is feeling generally well but does have some concerns. Gnosis she's been eating and exercising regularly and has stopped his Lantus. Denies polyuria or polydipsia. He is taking his medications as prescribed. Has had some problems with mild nasal congestion recently. No fevers or chills. No headaches. No GI or GU complaints. Has been using Alka-Seltzer nighttime to help him sleep. Benadryl has not been helpful. Has cut back to 2300 KcL daily.  Past Medical History  Diagnosis Date  . Obesity   . Iron deficiency anemia 2011  . Psoriasis   . Hepatic artery stenosis   . Colon cancer     right colon cancer- adenocarcinoma CEA level isnrmal at 1.8  . Diabetes mellitus   . Hypertension   . Hyperlipidemia   . Hypertriglyceridemia 12/14/2010  . Preventative health care 10/08/2012    Past Surgical History  Procedure Laterality Date  . Cholecystectomy  1994  . Hemicolectomy      12/17/2009 right  . Colonoscopy    . Polypectomy    . Carpal tunnel release      RIGHT    Family History  Problem Relation Age of Onset  . Stomach cancer Mother     diedinher 77's  . Diabetes type II Brother     boderline  . Diabetes Brother     type II  . Other Neg Hx     cad,prostate ca, colon ca  . Coronary artery disease Neg Hx   . Cancer Neg Hx     colon, prostate  . Alcohol abuse Brother     History   Social History  . Marital Status: Single    Spouse Name: [name]    Number of Children: 0  . Years of Education: N/A   Occupational History  . Chiropodist and fax   Social History Main Topics  . Smoking status: Former Games developer  . Smokeless tobacco:  Never Used  . Alcohol Use: No  . Drug Use: No  . Sexually Active: Yes   Other Topics Concern  . Not on file   Social History Narrative   Occupation: Agricultural consultant and fax)   Single  (lives with [name])   no children    former smoker   Illicit Drug Use - no     Current Outpatient Prescriptions on File Prior to Visit  Medication Sig Dispense Refill  . atorvastatin (LIPITOR) 10 MG tablet Take 1 tablet (10 mg total) by mouth daily.  30 tablet  6  . Clobetasol Prop Emollient Base 0.05 % emollient cream Apply topically daily as needed.        . etanercept (ENBREL) 50 MG/ML injection Inject 50 mg into the skin once a week. Inject twice per week -hold       . fenofibrate 160 MG tablet Take 1 tablet (160 mg total) by mouth daily.  30 tablet  6  . glucose blood (ONE TOUCH TEST STRIPS) test strip OneTouch Ultra Blue. Use to check blood sugar once a day. Dx: 250.00  100 each  3  . losartan (COZAAR) 25 MG tablet Take  1 tablet (25 mg total) by mouth daily.  30 tablet  6  . omega-3 acid ethyl esters (LOVAZA) 1 G capsule Take 2 capsules (2 g total) by mouth 2 (two) times daily.  60 capsule  6  . omeprazole (PRILOSEC) 40 MG capsule Take 1 capsule (40 mg total) by mouth 2 (two) times daily before a meal.  60 capsule  3  . ONETOUCH DELICA LANCETS MISC       . diclofenac (VOLTAREN) 75 MG EC tablet Take 1 tablet (75 mg total) by mouth 2 (two) times daily as needed. One by mouth twice a day for 5 days then twice a day as needed for knee pain (take with food)  30 tablet  0  . [DISCONTINUED] traZODone (DESYREL) 50 MG tablet Take 1 tablet (50 mg total) by mouth at bedtime as needed.  30 tablet  5   Current Facility-Administered Medications on File Prior to Visit  Medication Dose Route Frequency Provider Last Rate Last Dose  . 0.9 %  sodium chloride infusion  500 mL Intravenous Continuous Hilarie Fredrickson, MD        No Known Allergies  Review of Systems  Review of Systems  Constitutional: Negative for  fever and malaise/fatigue.  HENT: Positive for congestion. Negative for sore throat.   Eyes: Negative for pain and discharge.  Respiratory: Negative for shortness of breath.   Cardiovascular: Negative for chest pain, palpitations and leg swelling.  Gastrointestinal: Negative for nausea, abdominal pain and diarrhea.  Genitourinary: Negative for dysuria.  Musculoskeletal: Negative for myalgias, back pain and falls.  Skin: Negative for rash.  Neurological: Negative for dizziness, loss of consciousness and headaches.  Endo/Heme/Allergies: Negative for polydipsia.  Psychiatric/Behavioral: Negative for depression and suicidal ideas. The patient is not nervous/anxious.     Objective  BP 140/72  Pulse 77  Temp(Src) 97.4 F (36.3 C) (Oral)  Ht 5\' 9"  (1.753 m)  Wt 229 lb (103.874 kg)  BMI 33.8 kg/m2  SpO2 98%  Physical Exam  Physical Exam  Constitutional: He is oriented to person, place, and time and well-developed, well-nourished, and in no distress. No distress.  HENT:  Head: Normocephalic and atraumatic.  Eyes: Conjunctivae are normal.  Neck: Neck supple. No thyromegaly present.  Cardiovascular: Normal rate, regular rhythm and normal heart sounds.   No murmur heard. Pulmonary/Chest: Effort normal and breath sounds normal. No respiratory distress.  Abdominal: He exhibits no distension and no mass. There is no tenderness.  Musculoskeletal: He exhibits no edema.  Neurological: He is alert and oriented to person, place, and time.  Skin: Skin is warm.  Psychiatric: Memory, affect and judgment normal.       Assessment & Plan  DIABETES MELLITUS, TYPE II Improved control with lifestyle changes including exercising at a gym regularly, cutting out fast food and eating less carbohydrates. Is not using any Lantus. Given refill on Metformin today  PSORIASIS Using light box, Embrel. Doing well  OBESITY Over 20 pound weight loss since starting new diet, exercise  regimen  ADENOCARCINOMA, COLON Doing well, no recent trouble bowels moving regularly due for repeat colonoscopy in 2015  HYPERLIPIDEMIA Tolerating Fenofibrate, Atorvastatin and fatty acids. Avoid trans fats, encouraged increased exercise to increase HDL cholesterol  Preventative health care Encouraged DASH diet and regular exercise. Encouraged 8 hours of sleep nightly.   ANEMIA, IRON DEFICIENCY, MICROCYTIC Mild encouraged increased iron in diet. Had last colonoscopy in 2012. No change in bowel habits.

## 2012-10-08 NOTE — Assessment & Plan Note (Signed)
Tolerating Fenofibrate, Atorvastatin and fatty acids. Avoid trans fats, encouraged increased exercise to increase HDL cholesterol

## 2012-10-09 ENCOUNTER — Encounter: Payer: Self-pay | Admitting: Family Medicine

## 2012-10-10 NOTE — Telephone Encounter (Signed)
Please advise 

## 2012-11-02 ENCOUNTER — Other Ambulatory Visit: Payer: Self-pay | Admitting: Family Medicine

## 2012-11-06 NOTE — Telephone Encounter (Signed)
Please advise refill? Last RX was wrote on 10-05-12 quantity 15 with 1 refill  If ok fax to [phone]

## 2012-11-08 ENCOUNTER — Other Ambulatory Visit: Payer: Self-pay

## 2012-11-08 ENCOUNTER — Telehealth: Payer: Self-pay | Admitting: Internal Medicine

## 2012-11-08 MED ORDER — OMEPRAZOLE 40 MG PO CPDR: 40.0000 mg | DELAYED_RELEASE_CAPSULE | Freq: Two times a day (BID) | ORAL | Status: DC

## 2012-11-08 NOTE — Telephone Encounter (Signed)
Refill-omeprazole 40mg  capsule. Take one capsule by mouth twice daily before a meal. Qty 60 last fill 2.19.13

## 2012-11-08 NOTE — Telephone Encounter (Signed)
RX for Omeprazole printed 1st time so resent RX

## 2012-11-16 ENCOUNTER — Telehealth: Payer: Self-pay | Admitting: Internal Medicine

## 2012-11-16 NOTE — Telephone Encounter (Signed)
Please advise refill for ferrex 150 mg? Last filled on 07/13/11.

## 2012-11-16 NOTE — Telephone Encounter (Signed)
Yes  Refill rx for iron for recurrence of anemia, disp #30, 2 rf

## 2012-11-16 NOTE — Telephone Encounter (Signed)
Refill- ferrex 150 forte plus capsules. Take one capsule by mouth daily. Qty 30 last fill 12.25.12

## 2012-11-17 MED ORDER — POLYSACCHARIDE IRON COMPLEX 150 MG PO CAPS: 150.0000 mg | ORAL_CAPSULE | Freq: Every day | ORAL | Status: DC

## 2012-11-17 NOTE — Telephone Encounter (Signed)
Rx sent in to pharmacy. 

## 2012-11-24 ENCOUNTER — Telehealth: Payer: Self-pay

## 2012-11-24 NOTE — Telephone Encounter (Signed)
Pt called stating he needed his Clobetosol spray refilled? I informed pt that we didn't see this on his med list and if he could give me the dosage and directions I would pass the message to Dr Abner Greenspan and she would look into refilling this for the pt. Pt was upset that the medication was not on his med list. Since Dr Rodena Medin has filled this for him.  Pt then proceeded to apologize after finding the bottle of spray and stated that his Dermatologist is the one that gave him this medication.  I informed pt about how important it is to inform us of every medication he is taking/using.  Pt apologized again and voiced understanding.

## 2012-12-08 ENCOUNTER — Encounter: Payer: Self-pay | Admitting: Family Medicine

## 2012-12-08 ENCOUNTER — Other Ambulatory Visit: Payer: Self-pay | Admitting: Family Medicine

## 2012-12-08 NOTE — Telephone Encounter (Signed)
Zolpidem request [Last Rx 04.17.14 #15x0; Last Ov 03.20.14]/SLS Please advise.

## 2012-12-13 ENCOUNTER — Encounter: Payer: Self-pay | Admitting: Family Medicine

## 2012-12-14 MED ORDER — ZOLPIDEM TARTRATE 10 MG PO TABS: 10.0000 mg | ORAL_TABLET | Freq: Every evening | ORAL | Status: DC | PRN

## 2012-12-14 NOTE — Telephone Encounter (Signed)
OK to refill the Ambien 10 mg 1 tab po qhs, #60, 1 rf

## 2012-12-14 NOTE — Telephone Encounter (Signed)
Please advise? RX was wrote on 12-08-12 quantity 15 with 0 refills  If ok fax to [phone]

## 2013-01-08 ENCOUNTER — Other Ambulatory Visit: Payer: Self-pay | Admitting: Internal Medicine

## 2013-01-08 LAB — CBC
HCT: 40.3 % (ref 39.0–52.0)
RDW: 12.5 % (ref 11.5–15.5)
WBC: 7.4 10*3/uL (ref 4.0–10.5)

## 2013-01-08 LAB — LIPID PANEL
HDL: 34 mg/dL — ABNORMAL LOW (ref >39)
LDL Cholesterol: 52 mg/dL (ref 0–99)
VLDL: 43 mg/dL — ABNORMAL HIGH (ref 0–40)

## 2013-01-08 LAB — HEMOGLOBIN A1C
Hgb A1c MFr Bld: 6.4 % — ABNORMAL HIGH (ref <5.7)
Mean Plasma Glucose: 137 mg/dL — ABNORMAL HIGH (ref <117)

## 2013-01-08 LAB — HEPATIC FUNCTION PANEL
AST: 22 U/L (ref 0–37)
Bilirubin, Direct: 0.1 mg/dL (ref 0.0–0.3)
Total Bilirubin: 0.8 mg/dL (ref 0.3–1.2)

## 2013-01-08 LAB — BASIC METABOLIC PANEL
BUN: 17 mg/dL (ref 6–23)
Glucose, Bld: 104 mg/dL — ABNORMAL HIGH (ref 70–99)
Potassium: 4.2 mEq/L (ref 3.5–5.3)

## 2013-01-08 LAB — PHOSPHORUS: Phosphorus: 2.7 mg/dL (ref 2.3–4.6)

## 2013-01-08 LAB — TSH: TSH: 2.929 u[IU]/mL (ref 0.350–4.500)

## 2013-01-09 LAB — MICROALBUMIN, URINE: Microalb, Ur: 0.5 mg/dL (ref 0.00–1.89)

## 2013-01-09 LAB — TESTOSTERONE, FREE, TOTAL, SHBG
Sex Hormone Binding: 23 nmol/L (ref 13–71)
Testosterone, Free: 70.8 pg/mL (ref 47.0–244.0)

## 2013-01-11 ENCOUNTER — Encounter: Payer: Self-pay | Admitting: Family Medicine

## 2013-01-11 ENCOUNTER — Ambulatory Visit (INDEPENDENT_AMBULATORY_CARE_PROVIDER_SITE_OTHER): Payer: 59 | Admitting: Family Medicine

## 2013-01-11 VITALS — BP 126/72 | HR 83 | Temp 98.3°F | Ht 69.0 in | Wt 228.1 lb

## 2013-01-11 DIAGNOSIS — E119 Type 2 diabetes mellitus without complications: Secondary | ICD-10-CM

## 2013-01-11 DIAGNOSIS — K219 Gastro-esophageal reflux disease without esophagitis: Secondary | ICD-10-CM

## 2013-01-11 DIAGNOSIS — G4700 Insomnia, unspecified: Secondary | ICD-10-CM

## 2013-01-11 DIAGNOSIS — E781 Pure hyperglyceridemia: Secondary | ICD-10-CM

## 2013-01-11 MED ORDER — ESZOPICLONE 1 MG PO TABS: 1.0000 mg | ORAL_TABLET | Freq: Every evening | ORAL | Status: DC | PRN

## 2013-01-11 NOTE — Patient Instructions (Addendum)
Try Lunesta 1 mg qhs if no improvement then can try 2 mg, if this does not work and no bad side effects the  Can try 3 mg at bed. That is not effective we can not go any higher, we will have to change meds.  Restart Lovaza 2 caps daily when this is gone try [employer] caps called MegaRed caps daily. (goes on sale at [employer] 2 for 1 frequently, cheap at [employer])   Labs prior to visit: lipid, renal, hepatic, hgba1c, tsh, cbc, testosterone Diabetes and Exercise Regular exercise is important and can help:   Control blood glucose (sugar).  Decrease blood pressure.    Control blood lipids (cholesterol, triglycerides).  Improve overall health. BENEFITS FROM EXERCISE  Improved fitness.  Improved flexibility.  Improved endurance.  Increased bone density.  Weight control.  Increased muscle strength.  Decreased body fat.  Improvement of the body's use of insulin, a hormone.  Increased insulin sensitivity.  Reduction of insulin needs.  Reduced stress and tension.  Helps you feel better. People with diabetes who add exercise to their lifestyle gain additional benefits, including:  Weight loss.  Reduced appetite.  Improvement of the body's use of blood glucose.  Decreased risk factors for heart disease:  Lowering of cholesterol and triglycerides.  Raising the level of good cholesterol (high-density lipoproteins, HDL).  Lowering blood sugar.  Decreased blood pressure. TYPE 1 DIABETES AND EXERCISE  Exercise will usually lower your blood glucose.  If blood glucose is greater than 240 mg/dl, check urine ketones. If ketones are present, do not exercise.  Location of the insulin injection sites may need to be adjusted with exercise. Avoid injecting insulin into areas of the body that will be exercised. For example, avoid injecting insulin into:  The arms when playing tennis.  The legs when jogging. For more information, discuss this with your caregiver.  Keep a  record of:  Food intake.  Type and amount of exercise.  Expected peak times of insulin action.  Blood glucose levels. Do this before, during, and after exercise. Review your records with your caregiver. This will help you to develop guidelines for adjusting food intake and insulin amounts.  TYPE 2 DIABETES AND EXERCISE  Regular physical activity can help control blood glucose.  Exercise is important because it may:  Increase the body's sensitivity to insulin.  Improve blood glucose control.  Exercise reduces the risk of heart disease. It decreases serum cholesterol and triglycerides. It also lowers blood pressure.  Those who take insulin or oral hypoglycemic agents should watch for signs of hypoglycemia. These signs include dizziness, shaking, sweating, chills, and confusion.  Body water is lost during exercise. It must be replaced. This will help to avoid loss of body fluids (dehydration) or heat stroke. Be sure to talk to your caregiver before starting an exercise program to make sure it is safe for you. Remember, any activity is better than none.  Document Released: 09/25/2003 Document Revised: 09/27/2011 Document Reviewed: 01/09/2009 [facility] Patient Information 2014 [place], [place].

## 2013-01-11 NOTE — Assessment & Plan Note (Signed)
Good improvement in hgba1c, continue to minimize carbs

## 2013-01-11 NOTE — Progress Notes (Signed)
Patient ID: [name], male   DOB: [birth year], 52 y.o.   MRN: [id number] [name] [id number] [birth year] 01/11/2013      Progress Note-Follow Up  Subjective  Chief Complaint  Chief Complaint  Patient presents with  . Follow-up    3 month    HPI  Patient is a 52 year old male in today for followup. Generally feeling well. Taking medications as prescribed. No recent illness. No chest pain, palpitations, shortness of breath, GI or GU complaints at this time. No recent illness heartburn is well controlled on current medications and with dietary changes. Past Medical History  Diagnosis Date  . Obesity   . Iron deficiency anemia 2011  . Psoriasis   . Hepatic artery stenosis   . Colon cancer     right colon cancer- adenocarcinoma CEA level isnrmal at 1.8  . Diabetes mellitus   . Hypertension   . Hyperlipidemia   . Hypertriglyceridemia 12/14/2010  . Preventative health care 10/08/2012    Past Surgical History  Procedure Laterality Date  . Cholecystectomy  1994  . Hemicolectomy      12/17/2009 right  . Colonoscopy    . Polypectomy    . Carpal tunnel release      RIGHT    Family History  Problem Relation Age of Onset  . Stomach cancer Mother     diedinher 33's  . Diabetes type II Brother     boderline  . Diabetes Brother     type II  . Other Neg Hx     cad,prostate ca, colon ca  . Coronary artery disease Neg Hx   . Cancer Neg Hx     colon, prostate  . Alcohol abuse Brother     History   Social History  . Marital Status: Single    Spouse Name: [name]    Number of Children: 0  . Years of Education: N/A   Occupational History  . Chiropodist and fax   Social History Main Topics  . Smoking status: Former Games developer  . Smokeless tobacco: Never Used  . Alcohol Use: No  . Drug Use: No  . Sexually Active: Yes   Other Topics Concern  . Not on file   Social History Narrative   Occupation: Agricultural consultant and fax)   Single  (lives with [name])    no children    former smoker   Illicit Drug Use - no     Current Outpatient Prescriptions on File Prior to Visit  Medication Sig Dispense Refill  . atorvastatin (LIPITOR) 10 MG tablet TAKE 1 TABLET BY MOUTH EVERY DAY  30 tablet  3  . Clobetasol Prop Emollient Base 0.05 % emollient cream Apply topically daily as needed.        . diclofenac (VOLTAREN) 75 MG EC tablet Take 1 tablet (75 mg total) by mouth 2 (two) times daily as needed. One by mouth twice a day for 5 days then twice a day as needed for knee pain (take with food)  30 tablet  0  . etanercept (ENBREL) 50 MG/ML injection Inject 50 mg into the skin once a week. Inject twice per week -hold       . fenofibrate 160 MG tablet TAKE 1 TABLET BY MOUTH EVERY DAY  30 tablet  1  . glucose blood (ONE TOUCH TEST STRIPS) test strip OneTouch Ultra Blue. Use to check blood sugar once a day. Dx: 250.00  100 each  3  . iron  polysaccharides (NIFEREX) 150 MG capsule Take 1 capsule (150 mg total) by mouth daily.  30 capsule  2  . losartan (COZAAR) 25 MG tablet TAKE 1 TABLET BY MOUTH EVERY DAY  30 tablet  0  . metFORMIN (GLUCOPHAGE) 1000 MG tablet Take 1 tablet (1,000 mg total) by mouth 2 (two) times daily with a meal.  60 tablet  6  . omeprazole (PRILOSEC) 40 MG capsule Take 1 capsule (40 mg total) by mouth 2 (two) times daily before a meal.  60 capsule  3  . ONETOUCH DELICA LANCETS MISC       . omega-3 acid ethyl esters (LOVAZA) 1 G capsule Take 2 capsules (2 g total) by mouth 2 (two) times daily.  60 capsule  6  . [DISCONTINUED] traZODone (DESYREL) 50 MG tablet Take 1 tablet (50 mg total) by mouth at bedtime as needed.  30 tablet  5   Current Facility-Administered Medications on File Prior to Visit  Medication Dose Route Frequency Provider Last Rate Last Dose  . 0.9 %  sodium chloride infusion  500 mL Intravenous Continuous Hilarie Fredrickson, MD        No Known Allergies  Review of Systems  Review of Systems  Constitutional: Negative for fever and  malaise/fatigue.  HENT: Negative for congestion.   Eyes: Negative for discharge.  Respiratory: Negative for shortness of breath.   Cardiovascular: Negative for chest pain, palpitations and leg swelling.  Gastrointestinal: Negative for nausea, abdominal pain and diarrhea.  Genitourinary: Negative for dysuria.  Musculoskeletal: Negative for falls.  Skin: Negative for rash.  Neurological: Negative for loss of consciousness and headaches.  Endo/Heme/Allergies: Negative for polydipsia.  Psychiatric/Behavioral: Negative for depression and suicidal ideas. The patient is not nervous/anxious and does not have insomnia.     Objective  BP 126/72  Pulse 83  Temp(Src) 98.3 F (36.8 C) (Oral)  Ht 5\' 9"  (1.753 m)  Wt 228 lb 1.9 oz (103.475 kg)  BMI 33.67 kg/m2  SpO2 96%  Physical Exam  Physical Exam  Constitutional: He is oriented to person, place, and time and well-developed, well-nourished, and in no distress. No distress.  HENT:  Head: Normocephalic and atraumatic.  Left Ear: External ear normal.  Eyes: Conjunctivae are normal.  Neck: Neck supple. No thyromegaly present.  Cardiovascular: Normal rate, regular rhythm and normal heart sounds.   No murmur heard. Pulmonary/Chest: Effort normal and breath sounds normal. No respiratory distress.  Abdominal: He exhibits no distension and no mass. There is no tenderness.  Musculoskeletal: He exhibits no edema.  Neurological: He is alert and oriented to person, place, and time.  Skin: Skin is warm.  Psychiatric: Memory, affect and judgment normal.    Lab Results  Component Value Date   TSH 2.929 01/08/2013   Lab Results  Component Value Date   WBC 7.4 01/08/2013   HGB 14.3 01/08/2013   HCT 40.3 01/08/2013   MCV 88.0 01/08/2013   PLT 304 01/08/2013   Lab Results  Component Value Date   CREATININE 0.96 01/08/2013   BUN 17 01/08/2013   NA 138 01/08/2013   K 4.2 01/08/2013   CL 105 01/08/2013   CO2 22 01/08/2013   Lab Results  Component  Value Date   ALT 29 01/08/2013   AST 22 01/08/2013   ALKPHOS 43 01/08/2013   BILITOT 0.8 01/08/2013   Lab Results  Component Value Date   CHOL 129 01/08/2013   Lab Results  Component Value Date   HDL 34* 01/08/2013  Lab Results  Component Value Date   LDLCALC 52 01/08/2013   Lab Results  Component Value Date   TRIG 215* 01/08/2013   Lab Results  Component Value Date   CHOLHDL 3.8 01/08/2013     Assessment & Plan  INSOMNIA, CHRONIC Will try Lunesta 1mg  qhs  DIABETES MELLITUS, TYPE II Good improvement in hgba1c, continue to minimize carbs  Hypertriglyceridemia Restart Lovaza at 2 caps daily, avoid trans fats and minimize simple carbs  GERD Well controlled with Omeprazole and dietary restrictions.

## 2013-01-11 NOTE — Assessment & Plan Note (Signed)
Restart Lovaza at 2 caps daily, avoid trans fats and minimize simple carbs

## 2013-01-11 NOTE — Assessment & Plan Note (Addendum)
Will try Lunesta 1mg  qhs

## 2013-01-14 NOTE — Assessment & Plan Note (Signed)
Well controlled with Omeprazole and dietary restrictions.

## 2013-02-06 ENCOUNTER — Other Ambulatory Visit: Payer: Self-pay | Admitting: Family Medicine

## 2013-03-09 ENCOUNTER — Other Ambulatory Visit: Payer: Self-pay | Admitting: Family Medicine

## 2013-04-04 ENCOUNTER — Other Ambulatory Visit: Payer: Self-pay | Admitting: Family Medicine

## 2013-04-05 ENCOUNTER — Telehealth: Payer: Self-pay | Admitting: Internal Medicine

## 2013-04-05 NOTE — Telephone Encounter (Signed)
It would be fine

## 2013-04-05 NOTE — Telephone Encounter (Signed)
Left message for pt to call back.  Pt is due for a recall in June of 2015. Pt would like to have the colon done before the end of December, he has already met his out of pocket deductible. Please advise.

## 2013-04-09 ENCOUNTER — Telehealth: Payer: Self-pay

## 2013-04-09 DIAGNOSIS — D508 Other iron deficiency anemias: Secondary | ICD-10-CM

## 2013-04-09 DIAGNOSIS — E785 Hyperlipidemia, unspecified: Secondary | ICD-10-CM

## 2013-04-09 DIAGNOSIS — E119 Type 2 diabetes mellitus without complications: Secondary | ICD-10-CM

## 2013-04-09 DIAGNOSIS — Z0000 Encounter for general adult medical examination without abnormal findings: Secondary | ICD-10-CM

## 2013-04-09 LAB — CBC
HCT: 41.4 % (ref 39.0–52.0)
MCHC: 35.5 g/dL (ref 30.0–36.0)
MCV: 88.8 fL (ref 78.0–100.0)
Platelets: 304 10*3/uL (ref 150–400)
RBC: 4.66 MIL/uL (ref 4.22–5.81)
RDW: 12.8 % (ref 11.5–15.5)
WBC: 6.9 10*3/uL (ref 4.0–10.5)

## 2013-04-09 NOTE — Telephone Encounter (Signed)
Lab order placed.

## 2013-04-10 LAB — RENAL FUNCTION PANEL
BUN: 13 mg/dL (ref 6–23)
Chloride: 104 mEq/L (ref 96–112)
Phosphorus: 2.8 mg/dL (ref 2.3–4.6)
Potassium: 4.1 mEq/L (ref 3.5–5.3)

## 2013-04-10 LAB — HEPATIC FUNCTION PANEL
Alkaline Phosphatase: 52 U/L (ref 39–117)
Indirect Bilirubin: 0.7 mg/dL (ref 0.0–0.9)
Total Bilirubin: 0.8 mg/dL (ref 0.3–1.2)
Total Protein: 7.1 g/dL (ref 6.0–8.3)

## 2013-04-10 LAB — TSH: TSH: 4.166 u[IU]/mL (ref 0.350–4.500)

## 2013-04-10 LAB — LIPID PANEL
Cholesterol: 158 mg/dL (ref 0–200)
VLDL: 72 mg/dL — ABNORMAL HIGH (ref 0–40)

## 2013-04-10 LAB — TESTOSTERONE, FREE, TOTAL, SHBG: Sex Hormone Binding: 26 nmol/L (ref 13–71)

## 2013-04-11 ENCOUNTER — Ambulatory Visit (INDEPENDENT_AMBULATORY_CARE_PROVIDER_SITE_OTHER): Payer: 59 | Admitting: Family Medicine

## 2013-04-11 ENCOUNTER — Telehealth: Payer: Self-pay

## 2013-04-11 ENCOUNTER — Encounter: Payer: Self-pay | Admitting: Internal Medicine

## 2013-04-11 ENCOUNTER — Encounter: Payer: Self-pay | Admitting: Family Medicine

## 2013-04-11 VITALS — BP 122/72 | HR 76 | Temp 97.9°F | Ht 69.0 in | Wt 233.1 lb

## 2013-04-11 DIAGNOSIS — C189 Malignant neoplasm of colon, unspecified: Secondary | ICD-10-CM

## 2013-04-11 DIAGNOSIS — E119 Type 2 diabetes mellitus without complications: Secondary | ICD-10-CM

## 2013-04-11 DIAGNOSIS — G4700 Insomnia, unspecified: Secondary | ICD-10-CM

## 2013-04-11 DIAGNOSIS — E785 Hyperlipidemia, unspecified: Secondary | ICD-10-CM

## 2013-04-11 MED ORDER — INSULIN GLARGINE 100 UNIT/ML ~~LOC~~ SOLN: 60.0000 [IU] | Freq: Every day | SUBCUTANEOUS | Status: DC

## 2013-04-11 MED ORDER — ESZOPICLONE 3 MG PO TABS
3.0000 mg | ORAL_TABLET | Freq: Every day | ORAL | Status: DC
Start: 2013-04-11 — End: 2013-09-14

## 2013-04-11 MED ORDER — GLIMEPIRIDE 1 MG PO TABS: 1.0000 mg | ORAL_TABLET | Freq: Every day | ORAL | Status: DC

## 2013-04-11 NOTE — Telephone Encounter (Signed)
Pt called back and scheduled his previsit and colon.

## 2013-04-11 NOTE — Telephone Encounter (Signed)
Patient informed and new RX sent 

## 2013-04-11 NOTE — Telephone Encounter (Signed)
Patient called very upset stating that when he was in this morning the doctor told him not to take Glimperide but when he got to the pharmacy they had the RX there.  The patient also stated that he didn't want Lantus vials- that he specifically stated to MD that he wanted the Lantus Pens.  Pt said he wants an explanation because now he is at home and has Glimperide and Lantus vials.  I tried to explain to pt that I would send a note to MD but for future reference if he has a question he doesn't have to leave the pharmacy with any medication until he talks to [place]. He stated that he knows that but he didn't know how long it would be until he talked to [place].  Please advise?

## 2013-04-11 NOTE — Patient Instructions (Addendum)

## 2013-04-11 NOTE — Progress Notes (Signed)
Patient ID: [name], male   DOB: 31-Dec-1960, 52 y.o.   MRN: [id number] Brandan Duke [id number] 05/30/1961 04/11/2013      Progress Note-Follow Up  Subjective  Chief Complaint  Chief Complaint  Patient presents with  . Follow-up    3 month    HPI  Patient is a 52 year old Caucasian male who is in today for followup. He is not forthcoming with his recent history who most days. Has not helped he has not been taking his medications or following his diet until we are anemia plan to start a new medication. Then he confesses that he only takes his meds about half the week and has not been exercising. He's been eating poorly as well. He has tried [place] and 1 mg did not help to 3 days. He's having great stress and anxiety and history and is having trouble sleeping as a result. He describes great confrontation with his office. No recent illness. Is not complaining about reflux, chest pain, palpitations or shortness of breath. No GI or GU concerns noted today. Declines flu shot  Past Medical History  Diagnosis Date  . Obesity   . Iron deficiency anemia 2011  . Psoriasis   . Hepatic artery stenosis   . Colon cancer     right colon cancer- adenocarcinoma CEA level isnrmal at 1.8  . Diabetes mellitus   . Hypertension   . Hyperlipidemia   . Hypertriglyceridemia 12/14/2010  . Preventative health care 10/08/2012    Past Surgical History  Procedure Laterality Date  . Cholecystectomy  1994  . Hemicolectomy      12/17/2009 right  . Colonoscopy    . Polypectomy    . Carpal tunnel release      RIGHT    Family History  Problem Relation Age of Onset  . Stomach cancer Mother     diedinher 56's  . Diabetes type II Brother     boderline  . Diabetes Brother     type II  . Other Neg Hx     cad,prostate ca, colon ca  . Coronary artery disease Neg Hx   . Cancer Neg Hx     colon, prostate  . Alcohol abuse Brother     History   Social History  . Marital Status: Single    Spouse  Name: [name]    Number of Children: 0  . Years of Education: N/A   Occupational History  . Chiropodist and fax   Social History Main Topics  . Smoking status: Former Games developer  . Smokeless tobacco: Never Used  . Alcohol Use: No  . Drug Use: No  . Sexual Activity: Yes   Other Topics Concern  . Not on file   Social History Narrative   Occupation: Agricultural consultant and fax)   Single  (lives with [name])   no children    former smoker   Illicit Drug Use - no     Current Outpatient Prescriptions on File Prior to Visit  Medication Sig Dispense Refill  . atorvastatin (LIPITOR) 10 MG tablet TAKE 1 TABLET BY MOUTH EVERY DAY  30 tablet  3  . Clobetasol Prop Emollient Base 0.05 % emollient cream Apply topically daily as needed.        . etanercept (ENBREL) 50 MG/ML injection Inject 50 mg into the skin once a week. Inject twice per week -hold       . fenofibrate 160 MG tablet TAKE 1 TABLET BY MOUTH EVERY DAY  30 tablet  4  . glucose blood (ONE TOUCH TEST STRIPS) test strip OneTouch Ultra Blue. Use to check blood sugar once a day. Dx: 250.00  100 each  3  . losartan (COZAAR) 25 MG tablet TAKE 1 TABLET BY MOUTH EVERY DAY  30 tablet  0  . metFORMIN (GLUCOPHAGE) 1000 MG tablet Take 1 tablet (1,000 mg total) by mouth 2 (two) times daily with a meal.  60 tablet  6  . omeprazole (PRILOSEC) 40 MG capsule Take 1 capsule (40 mg total) by mouth 2 (two) times daily before a meal.  60 capsule  3  . ONETOUCH DELICA LANCETS MISC       . [DISCONTINUED] traZODone (DESYREL) 50 MG tablet Take 1 tablet (50 mg total) by mouth at bedtime as needed.  30 tablet  5   Current Facility-Administered Medications on File Prior to Visit  Medication Dose Route Frequency Provider Last Rate Last Dose  . 0.9 %  sodium chloride infusion  500 mL Intravenous Continuous Hilarie Fredrickson, MD        No Known Allergies  Review of Systems  Review of Systems  Constitutional: Negative for fever and malaise/fatigue.   HENT: Negative for congestion.   Eyes: Negative for discharge.  Respiratory: Negative for shortness of breath.   Cardiovascular: Negative for chest pain, palpitations and leg swelling.  Gastrointestinal: Negative for nausea, abdominal pain and diarrhea.  Genitourinary: Negative for dysuria.  Musculoskeletal: Negative for falls.  Skin: Negative for rash.  Neurological: Negative for loss of consciousness and headaches.  Endo/Heme/Allergies: Negative for polydipsia.  Psychiatric/Behavioral: Negative for depression and suicidal ideas. The patient is nervous/anxious and has insomnia.     Objective  BP 122/72  Pulse 76  Temp(Src) 97.9 F (36.6 C) (Oral)  Ht 5\' 9"  (1.753 m)  Wt 233 lb 1.9 oz (105.743 kg)  BMI 34.41 kg/m2  SpO2 96%  Physical Exam  Physical Exam  Constitutional: He is oriented to person, place, and time and well-developed, well-nourished, and in no distress. No distress.  HENT:  Head: Normocephalic and atraumatic.  Eyes: Conjunctivae are normal.  Neck: Neck supple. No thyromegaly present.  Cardiovascular: Normal rate, regular rhythm and normal heart sounds.   No murmur heard. Pulmonary/Chest: Effort normal and breath sounds normal. No respiratory distress.  Abdominal: He exhibits no distension and no mass. There is no tenderness.  Musculoskeletal: He exhibits no edema.  Neurological: He is alert and oriented to person, place, and time.  Skin: Skin is warm.  Psychiatric: Memory, affect and judgment normal.    Lab Results  Component Value Date   TSH 4.166 04/09/2013   Lab Results  Component Value Date   WBC 6.9 04/09/2013   HGB 14.7 04/09/2013   HCT 41.4 04/09/2013   MCV 88.8 04/09/2013   PLT 304 04/09/2013   Lab Results  Component Value Date   CREATININE 0.88 04/09/2013   BUN 13 04/09/2013   NA 137 04/09/2013   K 4.1 04/09/2013   CL 104 04/09/2013   CO2 25 04/09/2013   Lab Results  Component Value Date   ALT 62* 04/09/2013   AST 33 04/09/2013   ALKPHOS 52  04/09/2013   BILITOT 0.8 04/09/2013   Lab Results  Component Value Date   CHOL 158 04/09/2013   Lab Results  Component Value Date   HDL 36* 04/09/2013   Lab Results  Component Value Date   LDLCALC 50 04/09/2013   Lab Results  Component Value Date  TRIG 359* 04/09/2013   Lab Results  Component Value Date   CHOLHDL 4.4 04/09/2013     Assessment & Plan  DIABETES MELLITUS, TYPE II Has not been taking his Lantus or Metformin routinely. Frequently misses 1/2 the doses each week. Encouraged to take each dose routinely. He has also been eating badly. Needs to follow strict diet. Warned about risk of ocmplications including cardiac and neurologic disease, etc.  ADENOCARCINOMA, COLON Has done well since treatement  INSOMNIA, CHRONIC Lunesta was helpful at the 3 mg strength. Given rx today  HYPERLIPIDEMIA TGs up largely due to noncompliance with diet and meds. Encouraged regular use of meds. Avoid trans fats, minimize simple carbs and saturated fats, increase exercise

## 2013-04-11 NOTE — Telephone Encounter (Signed)
I do not know what to say. I canceled the Glimeperide while he was here and told him, I just refilled the Lantus in the chart and did not realize it had never been officially switched. So please switch in the [facility] to the pen with the same sig, disp #5 pens or 30 days supply. With 5 rf

## 2013-04-15 NOTE — Assessment & Plan Note (Signed)
Has not been taking his Lantus or Metformin routinely. Frequently misses 1/2 the doses each week. Encouraged to take each dose routinely. He has also been eating badly. Needs to follow strict diet. Warned about risk of ocmplications including cardiac and neurologic disease, etc.

## 2013-04-15 NOTE — Assessment & Plan Note (Signed)
Has done well since treatement

## 2013-04-15 NOTE — Assessment & Plan Note (Signed)
Lunesta was helpful at the 3 mg strength. Given rx today

## 2013-04-15 NOTE — Assessment & Plan Note (Signed)
TGs up largely due to noncompliance with diet and meds. Encouraged regular use of meds. Avoid trans fats, minimize simple carbs and saturated fats, increase exercise

## 2013-04-30 ENCOUNTER — Other Ambulatory Visit: Payer: Self-pay | Admitting: Family Medicine

## 2013-05-23 ENCOUNTER — Other Ambulatory Visit: Payer: Self-pay | Admitting: Family Medicine

## 2013-06-08 ENCOUNTER — Ambulatory Visit (AMBULATORY_SURGERY_CENTER): Payer: 59 | Admitting: *Deleted

## 2013-06-08 VITALS — Ht 69.0 in | Wt 234.4 lb

## 2013-06-08 DIAGNOSIS — Z85038 Personal history of other malignant neoplasm of large intestine: Secondary | ICD-10-CM

## 2013-06-08 MED ORDER — MOVIPREP 100 G PO SOLR: ORAL | Status: DC

## 2013-06-08 NOTE — Progress Notes (Signed)
No allergies to eggs or soy. No problems with anesthesia.  

## 2013-06-13 ENCOUNTER — Encounter: Payer: Self-pay | Admitting: Internal Medicine

## 2013-06-19 NOTE — Addendum Note (Signed)
Addended by: Maple Hudson on: 06/19/2013 12:47 PM   Modules accepted: Level of Service

## 2013-06-22 ENCOUNTER — Encounter: Payer: Self-pay | Admitting: Internal Medicine

## 2013-06-22 ENCOUNTER — Ambulatory Visit (AMBULATORY_SURGERY_CENTER): Payer: 59 | Admitting: Internal Medicine

## 2013-06-22 VITALS — BP 125/75 | HR 72 | Temp 97.3°F | Resp 15 | Ht 69.0 in | Wt 234.0 lb

## 2013-06-22 DIAGNOSIS — Z85038 Personal history of other malignant neoplasm of large intestine: Secondary | ICD-10-CM

## 2013-06-22 MED ORDER — SODIUM CHLORIDE 0.9 % IV SOLN: 500.0000 mL | INTRAVENOUS | Status: DC

## 2013-06-22 NOTE — Patient Instructions (Signed)
YOU HAD AN ENDOSCOPIC PROCEDURE TODAY AT THE [facility]: Refer to the procedure report that was given to you for any specific questions about what was found during the examination.  If the procedure report does not answer your questions, please call your gastroenterologist to clarify.  If you requested that your care partner not be given the details of your procedure findings, then the procedure report has been included in a sealed envelope for you to review at your convenience later.  YOU SHOULD EXPECT: Some feelings of bloating in the abdomen. Passage of more gas than usual.  Walking can help get rid of the air that was put into your GI tract during the procedure and reduce the bloating. If you had a lower endoscopy (such as a colonoscopy or flexible sigmoidoscopy) you may notice spotting of blood in your stool or on the toilet paper. If you underwent a bowel prep for your procedure, then you may not have a normal bowel movement for a few days.  DIET: Your first meal following the procedure should be a light meal and then it is ok to progress to your normal diet.  A half-sandwich or bowl of soup is an example of a good first meal.  Heavy or fried foods are harder to digest and may make you feel nauseous or bloated.  Likewise meals heavy in dairy and vegetables can cause extra gas to form and this can also increase the bloating.  Drink plenty of fluids but you should avoid alcoholic beverages for 24 hours.  ACTIVITY: Your care partner should take you home directly after the procedure.  You should plan to take it easy, moving slowly for the rest of the day.  You can resume normal activity the day after the procedure however you should NOT DRIVE or use heavy machinery for 24 hours (because of the sedation medicines used during the test).    SYMPTOMS TO REPORT IMMEDIATELY: A gastroenterologist can be reached at any hour.  During normal business hours, 8:30 AM to 5:00 PM Monday through Friday,  call [phone].  After hours and on weekends, please call the GI answering service at [phone] who will take a message and have the physician on call contact you.   Following lower endoscopy (colonoscopy or flexible sigmoidoscopy):  Excessive amounts of blood in the stool  Significant tenderness or worsening of abdominal pains  Swelling of the abdomen that is new, acute  Fever of 100F or higher  FOLLOW UP: If any biopsies were taken you will be contacted by phone or by letter within the next 1-3 weeks.  Call your gastroenterologist if you have not heard about the biopsies in 3 weeks.  Our staff will call the home number listed on your records the next business day following your procedure to check on you and address any questions or concerns that you may have at that time regarding the information given to you following your procedure. This is a courtesy call and so if there is no answer at the home number and we have not heard from you through the emergency physician on call, we will assume that you have returned to your regular daily activities without incident.  SIGNATURES/CONFIDENTIALITY: You and/or your care partner have signed paperwork which will be entered into your electronic medical record.  These signatures attest to the fact that that the information above on your After Visit Summary has been reviewed and is understood.  Full responsibility of the confidentiality of this   discharge information lies with you and/or your care-partner.  Recommendations See procedure report  

## 2013-06-22 NOTE — Op Note (Signed)
[facility] [address] [place], [address]   COLONOSCOPY PROCEDURE REPORT  PATIENT: [name], [name]  MR#: [id number] BIRTHDATE: 1961/03/28 , 52  yrs. old GENDER: Male ENDOSCOPIST: Roxy Cedar, MD REFERRED AO:[id number] Program Recall PROCEDURE DATE:  06/22/2013 PROCEDURE:   Colonoscopy, surveillance First Screening Colonoscopy - Avg.  risk and is 50 yrs.  old or older - No.  Prior Negative Screening - Now for repeat screening. N/A  History of Adenoma - Now for follow-up colonoscopy & has been > or = to 3 yrs.  N/A  Polyps Removed Today? No.  Recommend repeat exam, <10 yrs? Yes.  High risk (family or personal hx). ASA CLASS:   Class II INDICATIONS:High risk patient with personal history of colon cancer. right-sided colon cancer May 2011. Multiple adenomatous polyps as well.. Status post right hemicolectomy. No adjuvant therapy required. Last colonoscopy in June 2012. Adenomatous polyps removed MEDICATIONS: MAC sedation, administered by CRNA and propofol (Diprivan) 250mg  IV  DESCRIPTION OF PROCEDURE:   After the risks benefits and alternatives of the procedure were thoroughly explained, informed consent was obtained.  A digital rectal exam revealed no abnormalities of the rectum.   The [id number]  endoscope was introduced through the anus and advanced to the surgical anastomosis. No adverse events experienced.   The quality of the prep was excellent, using MoviPrep  The instrument was then slowly withdrawn as the colon was fully examined.      COLON FINDINGS: There was evidence of a prior ileocolonic surgical anastomosis.   Mild diverticulosis was noted in the sigmoid colon. The colon mucosa was otherwise normal.  Retroflexed views revealed internal hemorrhoids. The time to cecum=2 minutes 23 seconds. Withdrawal time=6 minutes 10 seconds.  The scope was withdrawn and the procedure completed.  COMPLICATIONS: There were no  complications.  ENDOSCOPIC IMPRESSION: 1.   There was evidence of a prior ileocolonic surgical anastomosis 2.   Mild diverticulosis was noted in the sigmoid colon 3.   The colon mucosa was otherwise normal  RECOMMENDATIONS: 1. Repeat Colonoscopy in 3 years.   eSigned:  Roxy Cedar, MD 06/22/2013 9:18 AM   cc: The Patient and Reuel Derby, MD

## 2013-06-25 ENCOUNTER — Telehealth: Payer: Self-pay | Admitting: *Deleted

## 2013-06-25 NOTE — Telephone Encounter (Signed)
Left message that we called for f/u 

## 2013-06-28 ENCOUNTER — Other Ambulatory Visit: Payer: Self-pay | Admitting: Family Medicine

## 2013-07-05 ENCOUNTER — Telehealth: Payer: Self-pay

## 2013-07-05 DIAGNOSIS — I10 Essential (primary) hypertension: Secondary | ICD-10-CM

## 2013-07-05 DIAGNOSIS — E781 Pure hyperglyceridemia: Secondary | ICD-10-CM

## 2013-07-05 DIAGNOSIS — E119 Type 2 diabetes mellitus without complications: Secondary | ICD-10-CM

## 2013-07-05 DIAGNOSIS — E785 Hyperlipidemia, unspecified: Secondary | ICD-10-CM

## 2013-07-05 DIAGNOSIS — Z0000 Encounter for general adult medical examination without abnormal findings: Secondary | ICD-10-CM

## 2013-07-05 NOTE — Telephone Encounter (Signed)
Unable to do labs today it is too soon, he cannot have labs prir to 12/22. Needs hgba1c, lipid, renal, cbc, tsh, hepatic for htn and diabetes

## 2013-07-05 NOTE — Telephone Encounter (Signed)
Pt came in today today for labs. Please advise what needs to be drawn and diagnosis?

## 2013-07-06 ENCOUNTER — Telehealth: Payer: Self-pay | Admitting: Family Medicine

## 2013-07-06 NOTE — Telephone Encounter (Signed)
OPENED IN ERROR

## 2013-07-06 NOTE — Telephone Encounter (Signed)
i called and left a detailed vm on pts phone.   Labs ordered for 12/22

## 2013-07-06 NOTE — Telephone Encounter (Signed)
Patient called in stating that he was very upset over this. He states to cancel the 12/23 appointment. I tried to reschedule the 10/08/13 appointment because the appointment was put in a 15 minute slot and all his appointments need to be in a 30 minute slot. He states that he wanted to cancel that appointment as well. He would like Christy to call him back on Tuesday, 07/10/13 because he has questions regarding lab work that could not have been done?

## 2013-07-10 ENCOUNTER — Ambulatory Visit: Payer: 59 | Admitting: Family Medicine

## 2013-07-10 NOTE — Telephone Encounter (Signed)
FYI: I called patient and he states that he doesn't understand why 3 days makes a difference. I tried to explain to pt that insurances only pay for lab work to be done every so often with a reasonable diagnosis code. I tried to explain that 3 days can make a difference considering they were last done on 04-09-13 so they could be redone on 07-09-13.  Pt stated that this doesn't make any sense because he never ran into this with Dr Rodena Medin. I explained that MD was trying to help him from getting a bill that he would have to pay. Pt then stated "well who is going to pay for the one I just had done", I stated that he wouldn't get a bill if nothing was sent out.

## 2013-09-14 ENCOUNTER — Other Ambulatory Visit: Payer: Self-pay | Admitting: Family Medicine

## 2013-09-17 NOTE — Telephone Encounter (Signed)
Please inform pt that MD refilled for 1 month but patient will need an appt for addt refills

## 2013-09-17 NOTE — Telephone Encounter (Signed)
Refill request for Eszopiclone Last filled by MD on - 04/11/2013 #30 x2 Last Appt: 04/11/2013 Next Appt: none  Please advise refill?

## 2013-09-17 NOTE — Telephone Encounter (Signed)
Looks like patient is transferring to Dr. Danise Mina at [facility]. His first appointment with him is 10/02/13 at 9:30

## 2013-09-17 NOTE — Telephone Encounter (Signed)
I have not seen him since Sept? Let him know I have approved one month worth of refills but if he wants to stay on this I need to see him roughly every 6 months so he will need an appt. I have printed

## 2013-09-21 ENCOUNTER — Other Ambulatory Visit: Payer: Self-pay | Admitting: Internal Medicine

## 2013-09-21 ENCOUNTER — Telehealth: Payer: Self-pay | Admitting: Family Medicine

## 2013-09-21 NOTE — Telephone Encounter (Signed)
OPENED IN ERROR

## 2013-10-02 ENCOUNTER — Ambulatory Visit: Payer: 59 | Admitting: Adult Health

## 2013-10-02 ENCOUNTER — Ambulatory Visit: Payer: 59 | Admitting: Family Medicine

## 2013-10-08 ENCOUNTER — Ambulatory Visit: Payer: 59 | Admitting: Family Medicine

## 2013-10-08 ENCOUNTER — Other Ambulatory Visit: Payer: Self-pay | Admitting: Family Medicine

## 2013-10-08 NOTE — Telephone Encounter (Signed)
Let him know I have sent in a 30 day supply of both so he does not run out but will need an appt with someone ot recheck labs before more can be prescribed because he has not had his sugar checked since September ob 2014

## 2013-10-08 NOTE — Telephone Encounter (Signed)
Please advise refills? Pt had 2 new patient appts set up with [facility] on 10-02-13 that were cancelled

## 2013-10-09 NOTE — Telephone Encounter (Signed)
Please inform pt of md's note below

## 2013-10-09 NOTE — Telephone Encounter (Signed)
Informed patient of medication refill and he scheduled appointment with [facility] for 10/11/13

## 2013-10-11 ENCOUNTER — Encounter: Payer: Self-pay | Admitting: Physician Assistant

## 2013-10-11 ENCOUNTER — Ambulatory Visit (INDEPENDENT_AMBULATORY_CARE_PROVIDER_SITE_OTHER): Payer: 59 | Admitting: Physician Assistant

## 2013-10-11 ENCOUNTER — Other Ambulatory Visit: Payer: Self-pay | Admitting: Family Medicine

## 2013-10-11 ENCOUNTER — Telehealth: Payer: Self-pay | Admitting: Family Medicine

## 2013-10-11 VITALS — BP 128/84 | HR 87 | Temp 98.2°F | Resp 18 | Wt 223.0 lb

## 2013-10-11 DIAGNOSIS — R7989 Other specified abnormal findings of blood chemistry: Secondary | ICD-10-CM

## 2013-10-11 DIAGNOSIS — F329 Major depressive disorder, single episode, unspecified: Secondary | ICD-10-CM | POA: Insufficient documentation

## 2013-10-11 DIAGNOSIS — F411 Generalized anxiety disorder: Secondary | ICD-10-CM

## 2013-10-11 DIAGNOSIS — F418 Other specified anxiety disorders: Secondary | ICD-10-CM

## 2013-10-11 DIAGNOSIS — E291 Testicular hypofunction: Secondary | ICD-10-CM

## 2013-10-11 DIAGNOSIS — F419 Anxiety disorder, unspecified: Secondary | ICD-10-CM

## 2013-10-11 HISTORY — DX: Other specified anxiety disorders: F41.8

## 2013-10-11 LAB — CBC
HCT: 40.4 % (ref 39.0–52.0)
Hemoglobin: 14.7 g/dL (ref 13.0–17.0)
MCH: 32 pg (ref 26.0–34.0)
MCHC: 36.4 g/dL — ABNORMAL HIGH (ref 30.0–36.0)
MCV: 88 fL (ref 78.0–100.0)
Platelets: 308 10*3/uL (ref 150–400)
RBC: 4.59 MIL/uL (ref 4.22–5.81)
RDW: 13.1 % (ref 11.5–15.5)
WBC: 9.9 10*3/uL (ref 4.0–10.5)

## 2013-10-11 LAB — HEMOGLOBIN A1C
Hgb A1c MFr Bld: 7 % — ABNORMAL HIGH (ref <5.7)
Mean Plasma Glucose: 154 mg/dL — ABNORMAL HIGH (ref <117)

## 2013-10-11 MED ORDER — DIAZEPAM 5 MG PO TABS: 5.0000 mg | ORAL_TABLET | Freq: Two times a day (BID) | ORAL | Status: DC | PRN

## 2013-10-11 NOTE — Telephone Encounter (Signed)
Relevant patient education assigned to patient using Emmi. ° °

## 2013-10-11 NOTE — Patient Instructions (Signed)
Please take Valium as directed.  Do not take both Valium and Lunesta at bedtime. Please continue other medications as directed.  I will call you with your results.  Generalized Anxiety Disorder Generalized anxiety disorder (GAD) is a mental disorder. It interferes with life functions, including relationships, work, and school. GAD is different from normal anxiety, which everyone experiences at some point in their lives in response to specific life events and activities. Normal anxiety actually helps [place] prepare for and get through these life events and activities. Normal anxiety goes away after the event or activity is over.  GAD causes anxiety that is not necessarily related to specific events or activities. It also causes excess anxiety in proportion to specific events or activities. The anxiety associated with GAD is also difficult to control. GAD can vary from mild to severe. People with severe GAD can have intense waves of anxiety with physical symptoms (panic attacks).  SYMPTOMS The anxiety and worry associated with GAD are difficult to control. This anxiety and worry are related to many life events and activities and also occur more days than not for 6 months or longer. People with GAD also have three or more of the following symptoms (one or more in children):  Restlessness.   Fatigue.  Difficulty concentrating.   Irritability.  Muscle tension.  Difficulty sleeping or unsatisfying sleep. DIAGNOSIS GAD is diagnosed through an assessment by your caregiver. Your caregiver will ask you questions aboutyour mood,physical symptoms, and events in your life. Your caregiver may ask you about your medical history and use of alcohol or drugs, including prescription medications. Your caregiver may also do a physical exam and blood tests. Certain medical conditions and the use of certain substances can cause symptoms similar to those associated with GAD. Your caregiver may refer you to a mental  health specialist for further evaluation. TREATMENT The following therapies are usually used to treat GAD:   Medication Antidepressant medication usually is prescribed for long-term daily control. Antianxiety medications may be added in severe cases, especially when panic attacks occur.   Talk therapy (psychotherapy) Certain types of talk therapy can be helpful in treating GAD by providing support, education, and guidance. A form of talk therapy called cognitive behavioral therapy can teach you healthy ways to think about and react to daily life events and activities.  Stress managementtechniques These include yoga, meditation, and exercise and can be very helpful when they are practiced regularly. A mental health specialist can help determine which treatment is best for you. Some people see improvement with one therapy. However, other people require a combination of therapies. Document Released: 10/30/2012 Document Reviewed: 10/30/2012 [facility] Patient Information 2014 [place], [place].

## 2013-10-11 NOTE — Telephone Encounter (Signed)
Please advise? Pt is seeing you today

## 2013-10-11 NOTE — Assessment & Plan Note (Signed)
Seems situational.  EKG obtained due to mention of palpitation.  EKG shows NSR. Rx Valium BID.  Will check TSH.  Patient also due for fasting labs.  Order already placed by PCP. Patient sent to lab.  Follow-up in 1 month.

## 2013-10-11 NOTE — Progress Notes (Signed)
Patient presents to clinic today to discuss anxiety and insomnia. Patient recently lost his job.  Endorses increased anxiety and stress.  Having occasional palpitations and chest tightness with anxiety.  Denies chest pain, shortness of breath, LH or dizziness.  Denies hx of thyroid disorder.  Has long-standing history of insomnia that has been successfully treated with Lunesta 3 mg at night.  Is not working at present.  Patient denies hx of depression.  Denies anhedonia, SI/HI.  Past Medical History  Diagnosis Date  . Obesity   . Iron deficiency anemia 2011  . Psoriasis   . Hepatic artery stenosis   . Colon cancer     right colon cancer- adenocarcinoma CEA level isnrmal at 1.8  . Diabetes mellitus   . Hypertension   . Hyperlipidemia   . Hypertriglyceridemia 12/14/2010  . Preventative health care 10/08/2012    Current Outpatient Prescriptions on File Prior to Visit  Medication Sig Dispense Refill  . atorvastatin (LIPITOR) 10 MG tablet TAKE 1 TABLET BY MOUTH EVERY DAY  90 tablet  0  . B-D ULTRAFINE III SHORT PEN 31G X 8 MM MISC TAKE AS DIRECTED  100 each  0  . Clobetasol Prop Emollient Base 0.05 % emollient cream Apply topically daily as needed.        Marland Kitchen CLOBEX SPRAY 0.05 % external spray       . etanercept (ENBREL) 50 MG/ML injection Inject 50 mg into the skin once a week. Inject twice per week -hold       . fenofibrate 160 MG tablet TAKE 1 TABLET BY MOUTH EVERY DAY  90 tablet  4  . glimepiride (AMARYL) 1 MG tablet TAKE 1 TABLET BY MOUTH EVERY DAY BEFORE BREAKFAST  30 tablet  0  . glucose blood (ONE TOUCH TEST STRIPS) test strip OneTouch Ultra Blue. Use to check blood sugar once a day. Dx: 250.00  100 each  3  . insulin glargine (LANTUS) 100 UNIT/ML injection Inject 0.6 mLs (60 Units total) into the skin at bedtime.  1 pen  6  . LANTUS SOLOSTAR 100 UNIT/ML Solostar Pen INJECT 60 UNITS UNDER THE SKIN EVERY NIGHT AT BEDTIME  15 mL  0  . losartan (COZAAR) 25 MG tablet TAKE 1 TABLET BY MOUTH  EVERY DAY  90 tablet  0  . metFORMIN (GLUCOPHAGE) 1000 MG tablet TAKE 1 TABLET BY MOUTH TWICE DAILY WITH MEALS  60 tablet  3  . omeprazole (PRILOSEC) 40 MG capsule TAKE 1 CAPSULE BY MOUTH TWICE DAILY BEFORE A MEAL  60 capsule  3  . [place] LANCETS MISC       . [DISCONTINUED] traZODone (DESYREL) 50 MG tablet Take 1 tablet (50 mg total) by mouth at bedtime as needed.  30 tablet  5   Current Facility-Administered Medications on File Prior to Visit  Medication Dose Route Frequency Provider Last Rate Last Dose  . 0.9 %  sodium chloride infusion  500 mL Intravenous Continuous Irene Shipper, MD        No Known Allergies  Family History  Problem Relation Age of Onset  . Stomach cancer Mother     diedinher 16's  . Diabetes type II Brother     boderline  . Diabetes Brother     type II  . Other Neg Hx     cad,prostate ca, colon ca  . Coronary artery disease Neg Hx   . Cancer Neg Hx     colon, prostate  . Alcohol abuse Brother  History   Social History  . Marital Status: Single    Spouse Name: [name]    Number of Children: 0  . Years of Education: N/A   Occupational History  . Chartered certified accountant and fax   Social History Main Topics  . Smoking status: Current Some Day Smoker    Types: Cigarettes  . Smokeless tobacco: Never Used  . Alcohol Use: No  . Drug Use: No  . Sexual Activity: Yes   Other Topics Concern  . 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  All other ROS are negative.  BP 128/84  Pulse 87  Temp(Src) 98.2 F (36.8 C) (Oral)  Resp 18  Wt 223 lb (101.152 kg)  SpO2 95%  Physical Exam  Vitals reviewed. Constitutional: He is oriented to person, place, and time and well-developed, well-nourished, and in no distress.  HENT:  Head: Normocephalic and atraumatic.  Eyes: Conjunctivae are normal.  Pupils are equal, round, and reactive to light.  Neck: Neck supple. No thyromegaly present.  Cardiovascular: Normal rate, regular rhythm, normal heart sounds and intact distal pulses.   Pulmonary/Chest: Effort normal and breath sounds normal. No respiratory distress. He has no wheezes. He has no rales. He exhibits no tenderness.  Neurological: He is alert and oriented to person, place, and time.  Skin: Skin is warm and dry. No rash noted.  Psychiatric: Mood, memory, affect and judgment normal.   No results found for this or any previous visit (from the past 2160 hour(s)).  Assessment/Plan: Anxiety Seems situational.  EKG obtained due to mention of palpitation.  EKG shows NSR. Rx Valium BID.  Will check TSH.  Patient also due for fasting labs.  Order already placed by PCP. Patient sent to lab.  Follow-up in 1 month.

## 2013-10-11 NOTE — Progress Notes (Signed)
Pre-visit discussion using our clinic review tool. No additional management support is needed unless otherwise documented below in the visit note.  

## 2013-10-12 LAB — LIPID PANEL
Cholesterol: 115 mg/dL (ref 0–200)
HDL: 34 mg/dL — ABNORMAL LOW (ref >39)
LDL Cholesterol: 42 mg/dL (ref 0–99)
Total CHOL/HDL Ratio: 3.4 Ratio
Triglycerides: 194 mg/dL — ABNORMAL HIGH (ref <150)
VLDL: 39 mg/dL (ref 0–40)

## 2013-10-12 LAB — RENAL FUNCTION PANEL
Albumin: 4.1 g/dL (ref 3.5–5.2)
BUN: 9 mg/dL (ref 6–23)
CO2: 24 mEq/L (ref 19–32)
Calcium: 9.4 mg/dL (ref 8.4–10.5)
Chloride: 105 mEq/L (ref 96–112)
Creat: 0.76 mg/dL (ref 0.50–1.35)
Glucose, Bld: 110 mg/dL — ABNORMAL HIGH (ref 70–99)
Phosphorus: 3.3 mg/dL (ref 2.3–4.6)
Potassium: 3.9 mEq/L (ref 3.5–5.3)
Sodium: 137 mEq/L (ref 135–145)

## 2013-10-12 LAB — HEPATIC FUNCTION PANEL
ALT: 52 U/L (ref 0–53)
AST: 31 U/L (ref 0–37)
Albumin: 4.1 g/dL (ref 3.5–5.2)
Alkaline Phosphatase: 47 U/L (ref 39–117)
Bilirubin, Direct: 0.3 mg/dL (ref 0.0–0.3)
Indirect Bilirubin: 1.2 mg/dL (ref 0.2–1.2)
Total Bilirubin: 1.5 mg/dL — ABNORMAL HIGH (ref 0.2–1.2)
Total Protein: 7 g/dL (ref 6.0–8.3)

## 2013-10-12 LAB — TSH: TSH: 2.376 u[IU]/mL (ref 0.350–4.500)

## 2013-10-12 LAB — TESTOSTERONE, FREE, TOTAL, SHBG
Sex Hormone Binding: 20 nmol/L (ref 13–71)
TESTOSTERONE-% FREE: 2.5 % (ref 1.6–2.9)
Testosterone, Free: 49.6 pg/mL (ref 47.0–244.0)
Testosterone: 201 ng/dL — ABNORMAL LOW (ref 300–890)

## 2013-11-19 ENCOUNTER — Encounter: Payer: Self-pay | Admitting: Physician Assistant

## 2013-11-19 DIAGNOSIS — F419 Anxiety disorder, unspecified: Secondary | ICD-10-CM

## 2013-11-20 MED ORDER — DIAZEPAM 5 MG PO TABS: 5.0000 mg | ORAL_TABLET | Freq: Two times a day (BID) | ORAL | Status: DC | PRN

## 2014-06-05 ENCOUNTER — Ambulatory Visit (INDEPENDENT_AMBULATORY_CARE_PROVIDER_SITE_OTHER): Payer: BC Managed Care – PPO | Admitting: Physician Assistant

## 2014-06-05 ENCOUNTER — Encounter: Payer: Self-pay | Admitting: Physician Assistant

## 2014-06-05 VITALS — BP 160/84 | HR 91 | Temp 98.2°F | Wt 224.0 lb

## 2014-06-05 DIAGNOSIS — R002 Palpitations: Secondary | ICD-10-CM | POA: Insufficient documentation

## 2014-06-05 DIAGNOSIS — I10 Essential (primary) hypertension: Secondary | ICD-10-CM

## 2014-06-05 DIAGNOSIS — M62830 Muscle spasm of back: Secondary | ICD-10-CM | POA: Insufficient documentation

## 2014-06-05 HISTORY — DX: Palpitations: R00.2

## 2014-06-05 HISTORY — DX: Essential (primary) hypertension: I10

## 2014-06-05 HISTORY — DX: Muscle spasm of back: M62.830

## 2014-06-05 MED ORDER — METOPROLOL TARTRATE 25 MG PO TABS: 25.0000 mg | ORAL_TABLET | Freq: Two times a day (BID) | ORAL | Status: DC

## 2014-06-05 MED ORDER — METHOCARBAMOL 500 MG PO TABS: 500.0000 mg | ORAL_TABLET | Freq: Three times a day (TID) | ORAL | Status: DC | PRN

## 2014-06-05 NOTE — Assessment & Plan Note (Signed)
Continue Supportive measures and Naproxen.  Rx Robaxin TID.  Not to be used before operating vehicle or machinery.  Avoid heavy lifting or overexertion.  Follow-up in 1 month.

## 2014-06-05 NOTE — Progress Notes (Signed)
Pre visit review using our clinic review tool, if applicable. No additional management support is needed unless otherwise documented below in the visit note. 

## 2014-06-05 NOTE — Patient Instructions (Addendum)
Please take the Metoprolol twice daily as directed for your blood pressure and palpitations.  Read information below on DASH diet to lower blood pressure.  Follow-up with Dr. Charlett Blake in 1 month.  For your back, please take the Robaxin as directed.  Do not operate a vehicle after taking this medication.  Continue the naproxen and bengay.  Avoid heavy lifting or overexertion.  DASH Eating Plan DASH stands for "Dietary Approaches to Stop Hypertension." The DASH eating plan is a healthy eating plan that has been shown to reduce high blood pressure (hypertension). Additional health benefits may include reducing the risk of type 2 diabetes mellitus, heart disease, and stroke. The DASH eating plan may also help with weight loss. WHAT DO I NEED TO KNOW ABOUT THE DASH EATING PLAN? For the DASH eating plan, you will follow these general guidelines:  Choose foods with a percent daily value for sodium of less than 5% (as listed on the food label).  Use salt-free seasonings or herbs instead of table salt or sea salt.  Check with your health care provider or pharmacist before using salt substitutes.  Eat lower-sodium products, often labeled as "lower sodium" or "no salt added."  Eat fresh foods.  Eat more vegetables, fruits, and low-fat dairy products.  Choose whole grains. Look for the word "whole" as the first word in the ingredient list.  Choose fish and skinless chicken or [place] more often than red meat. Limit fish, poultry, and meat to 6 oz (170 g) each day.  Limit sweets, desserts, sugars, and sugary drinks.  Choose heart-healthy fats.  Limit cheese to 1 oz (28 g) per day.  Eat more home-cooked food and less restaurant, buffet, and fast food.  Limit fried foods.  Cook foods using methods other than frying.  Limit canned vegetables. If you do use them, rinse them well to decrease the sodium.  When eating at a restaurant, ask that your food be prepared with less salt, or no salt if  possible. WHAT FOODS CAN I EAT? Seek help from a dietitian for individual calorie needs. Grains Whole grain or whole wheat bread. Brown rice. Whole grain or whole wheat pasta. Quinoa, bulgur, and whole grain cereals. Low-sodium cereals. Corn or whole wheat flour tortillas. Whole grain cornbread. Whole grain crackers. Low-sodium crackers. Vegetables Fresh or frozen vegetables (raw, steamed, roasted, or grilled). Low-sodium or reduced-sodium tomato and vegetable juices. Low-sodium or reduced-sodium tomato sauce and paste. Low-sodium or reduced-sodium canned vegetables.  Fruits All fresh, canned (in natural juice), or frozen fruits. Meat and Other Protein Products Ground beef (85% or leaner), grass-fed beef, or beef trimmed of fat. Skinless chicken or [place]. Ground chicken or [place]. Pork trimmed of fat. All fish and seafood. Eggs. Dried beans, peas, or lentils. Unsalted nuts and seeds. Unsalted canned beans. Dairy Low-fat dairy products, such as skim or 1% milk, 2% or reduced-fat cheeses, low-fat ricotta or cottage cheese, or plain low-fat yogurt. Low-sodium or reduced-sodium cheeses. Fats and Oils Tub margarines without trans fats. Light or reduced-fat mayonnaise and salad dressings (reduced sodium). Avocado. Safflower, olive, or canola oils. Natural peanut or almond butter. Other Unsalted popcorn and pretzels. The items listed above may not be a complete list of recommended foods or beverages. Contact your dietitian for more options. WHAT FOODS ARE NOT RECOMMENDED? Grains White bread. White pasta. White rice. Refined cornbread. Bagels and croissants. Crackers that contain trans fat. Vegetables Creamed or fried vegetables. Vegetables in a cheese sauce. Regular canned vegetables. Regular canned tomato sauce and  paste. Regular tomato and vegetable juices. Fruits Dried fruits. Canned fruit in light or heavy syrup. Fruit juice. Meat and Other Protein Products Fatty cuts of meat. Ribs, chicken  wings, bacon, sausage, bologna, salami, chitterlings, fatback, hot dogs, bratwurst, and packaged luncheon meats. Salted nuts and seeds. Canned beans with salt. Dairy Whole or 2% milk, cream, half-and-half, and cream cheese. Whole-fat or sweetened yogurt. Full-fat cheeses or blue cheese. Nondairy creamers and whipped toppings. Processed cheese, cheese spreads, or cheese curds. Condiments Onion and garlic salt, seasoned salt, table salt, and sea salt. Canned and packaged gravies. Worcestershire sauce. Tartar sauce. Barbecue sauce. Teriyaki sauce. Soy sauce, including reduced sodium. Steak sauce. Fish sauce. Oyster sauce. Cocktail sauce. Horseradish. Ketchup and mustard. Meat flavorings and tenderizers. Bouillon cubes. Hot sauce. Tabasco sauce. Marinades. Taco seasonings. Relishes. Fats and Oils Butter, stick margarine, lard, shortening, ghee, and bacon fat. Coconut, palm kernel, or palm oils. Regular salad dressings. Other Pickles and olives. Salted popcorn and pretzels. The items listed above may not be a complete list of foods and beverages to avoid. Contact your dietitian for more information. WHERE CAN I FIND MORE INFORMATION? National Heart, Lung, and Blood Institute: travelstabloid.com Document Released: 06/24/2011 Document Revised: 11/19/2013 Document Reviewed: 05/09/2013 [facility] Patient Information 2015 [place], [place]. This information is not intended to replace advice given to you by your health care provider. Make sure you discuss any questions you have with your health care provider.

## 2014-06-05 NOTE — Assessment & Plan Note (Signed)
Will begin metoprolol BID and DASH diet.  Follow-up with PCP in 1 month.

## 2014-06-05 NOTE — Progress Notes (Signed)
Patient presents to clinic today c/o thoracic and cervical back pain x 1 month, worsening each evening after working all day. Pain is described as achy with occasional sharp pain in the neck.  Also endorses stiffness in neck and back in the morning that resolves quickly with movement.   Patient endorses elevated BP and fast heart rate with stressful events, more prominent over the past several months.  Denies chest pain, lightheadedness, vision changes or headache.  Last EKG within normal limits.  Past Medical History  Diagnosis Date  . Obesity   . Iron deficiency anemia 2011  . Psoriasis   . Hepatic artery stenosis   . Colon cancer     right colon cancer- adenocarcinoma CEA level isnrmal at 1.8  . Diabetes mellitus   . Hypertension   . Hyperlipidemia   . Hypertriglyceridemia 12/14/2010  . Preventative health care 10/08/2012    Current Outpatient Prescriptions on File Prior to Visit  Medication Sig Dispense Refill  . atorvastatin (LIPITOR) 10 MG tablet TAKE 1 TABLET BY MOUTH EVERY DAY 90 tablet 0  . B-D ULTRAFINE III SHORT PEN 31G X 8 MM MISC TAKE AS DIRECTED 100 each 0  . Clobetasol Prop Emollient Base 0.05 % emollient cream Apply topically daily as needed.      Marland Kitchen CLOBEX SPRAY 0.05 % external spray     . diazepam (VALIUM) 5 MG tablet Take 1 tablet (5 mg total) by mouth every 12 (twelve) hours as needed for anxiety. 60 tablet 0  . Eszopiclone 3 MG TABS TAKE 1 TABLET BY MOUTH EVERY NIGHT IMMEDIATELY BEFORE BEDTIME 30 tablet 0  . etanercept (ENBREL) 50 MG/ML injection Inject 50 mg into the skin once a week. Inject twice per week -hold     . fenofibrate 160 MG tablet TAKE 1 TABLET BY MOUTH EVERY DAY 90 tablet 4  . glimepiride (AMARYL) 1 MG tablet TAKE 1 TABLET BY MOUTH EVERY DAY BEFORE BREAKFAST 30 tablet 0  . glucose blood (ONE TOUCH TEST STRIPS) test strip OneTouch Ultra Blue. Use to check blood sugar once a day. Dx: 250.00 100 each 3  . insulin glargine (LANTUS) 100 UNIT/ML  injection Inject 0.6 mLs (60 Units total) into the skin at bedtime. 1 pen 6  . LANTUS SOLOSTAR 100 UNIT/ML Solostar Pen INJECT 60 UNITS UNDER THE SKIN EVERY NIGHT AT BEDTIME 15 mL 0  . losartan (COZAAR) 25 MG tablet TAKE 1 TABLET BY MOUTH EVERY DAY 90 tablet 0  . metFORMIN (GLUCOPHAGE) 1000 MG tablet TAKE 1 TABLET BY MOUTH TWICE DAILY WITH MEALS 60 tablet 3  . omeprazole (PRILOSEC) 40 MG capsule TAKE 1 CAPSULE BY MOUTH TWICE DAILY BEFORE A MEAL 60 capsule 3  . [place] LANCETS MISC     . [DISCONTINUED] traZODone (DESYREL) 50 MG tablet Take 1 tablet (50 mg total) by mouth at bedtime as needed. 30 tablet 5   Current Facility-Administered Medications on File Prior to Visit  Medication Dose Route Frequency Provider Last Rate Last Dose  . 0.9 %  sodium chloride infusion  500 mL Intravenous Continuous Irene Shipper, MD        No Known Allergies  Family History  Problem Relation Age of Onset  . Stomach cancer Mother     diedinher 4's  . Diabetes type II Brother     boderline  . Diabetes Brother     type II  . Other Neg Hx     cad,prostate ca, colon ca  . Coronary  artery disease Neg Hx   . Cancer Neg Hx     colon, prostate  . Alcohol abuse Brother     History   Social History  . Marital Status: Single    Spouse Name: [name]    Number of Children: 0  . Years of Education: N/A   Occupational History  . Chartered certified accountant and fax   Social History Main Topics  . Smoking status: Current Some Day Smoker    Types: Cigarettes  . Smokeless tobacco: Never Used  . Alcohol Use: No  . Drug Use: No  . Sexual Activity: Yes   Other Topics Concern  . 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  All other ROS are negative.  BP 160/84 mmHg  Pulse 91  Temp(Src) 98.2 F (36.8 C)  Wt 224 lb (101.606 kg)  SpO2 96%  Physical  Exam  Constitutional: He is oriented to person, place, and time and well-developed, well-nourished, and in no distress.  HENT:  Head: Normocephalic and atraumatic.  Eyes: Conjunctivae are normal.  Neck: Neck supple.  Cardiovascular: Normal rate, regular rhythm, normal heart sounds and intact distal pulses.   Pulmonary/Chest: Effort normal and breath sounds normal. No respiratory distress. He has no wheezes. He has no rales. He exhibits no tenderness.  Musculoskeletal:       Cervical back: He exhibits pain and spasm. He exhibits normal range of motion, no tenderness and no bony tenderness.  Neurological: He is alert and oriented to person, place, and time.  Skin: Skin is warm and dry. No rash noted.  Psychiatric: Affect normal.  Vitals reviewed.  Assessment/Plan: Essential hypertension Will begin metoprolol BID and DASH diet.  Follow-up with PCP in 1 month.  Palpitations Previous workup including TSH and EKG unremarkable. Pulse at upper end of normal.  Also with HTN.  Will begin metoprolol tartrate BID.  Follow-up in 1 month.  If persistent, will order Holter.  Muscle spasm of back Continue Supportive measures and Naproxen.  Rx Robaxin TID.  Not to be used before operating vehicle or machinery.  Avoid heavy lifting or overexertion.  Follow-up in 1 month.

## 2014-06-05 NOTE — Assessment & Plan Note (Signed)
Previous workup including TSH and EKG unremarkable. Pulse at upper end of normal.  Also with HTN.  Will begin metoprolol tartrate BID.  Follow-up in 1 month.  If persistent, will order Holter.

## 2014-07-29 ENCOUNTER — Telehealth: Payer: Self-pay

## 2014-07-29 NOTE — Telephone Encounter (Signed)
Received call from Marko Stai PA with [facility] in [facility] asking if Dr. [name] is ok with pt starting on Humira for Plaque psoriasis. Dr. [name] please advise.  Call back number for Marko Stai PA [phone].

## 2014-07-29 NOTE — Telephone Encounter (Signed)
Not sure why they are asking me. I only see him for periodic colonoscopy. I do not treat psoriasis and defer to the dermatology attending (MD)

## 2014-07-29 NOTE — Telephone Encounter (Signed)
Left message for Laura to call back.

## 2014-07-30 NOTE — Telephone Encounter (Signed)
Spoke with Mickel Baas PA and she is aware. States she will check with the pts oncologist.

## 2014-08-06 ENCOUNTER — Telehealth: Payer: Self-pay | Admitting: *Deleted

## 2014-08-06 NOTE — Telephone Encounter (Signed)
Call received by TRIAGE-  Marko Stai PA with [facility] in [facility] is inquiring for Dr Benay Spice to call her regarding his approval for use of Humira for psoiasis.  Return call number for Mickel Baas is [phone].  THIS NOTE WILL BE SENT TO MD AND DESK NURSE FOR FOLLOW UP.

## 2014-10-27 ENCOUNTER — Inpatient Hospital Stay
Admit: 2014-10-27 | Disposition: A | Discharge status: Home / Self Care | Payer: Self-pay | Attending: Internal Medicine | Admitting: Internal Medicine

## 2014-10-27 LAB — CK TOTAL AND CKMB (NOT AT ARMC)
CK-MB: 1.2 ng/mL
CK-MB: 1.3 ng/mL
CK-MB: 1.1 ng/mL

## 2014-10-27 LAB — URINALYSIS, COMPLETE
BILIRUBIN, UR: NEGATIVE
Bacteria: NONE SEEN
Blood: NEGATIVE
Glucose,UR: >=500 mg/dL (ref 0–75)
Leukocyte Esterase: NEGATIVE
Nitrite: NEGATIVE
PH: 6 (ref 4.5–8.0)
Protein: NEGATIVE
SQUAMOUS EPITHELIAL: NONE SEEN
Specific Gravity: 1.033 (ref 1.003–1.030)

## 2014-10-27 LAB — LIPID PANEL
Cholesterol: 949 mg/dL — ABNORMAL HIGH
HDL Cholesterol: 20 mg/dL — ABNORMAL LOW
Triglycerides: >5000 mg/dL — ABNORMAL HIGH

## 2014-10-27 LAB — CBC
HCT: 41.7 % (ref 40.0–52.0)
HGB: 15.1 g/dL (ref 13.0–18.0)
MCH: 31.3 pg (ref 26.0–34.0)
MCHC: 36.2 g/dL — AB (ref 32.0–36.0)
MCV: 86 fL (ref 80–100)
Platelet: 292 10*3/uL (ref 150–440)
RBC: 4.82 10*6/uL (ref 4.40–5.90)
RDW: 14 % (ref 11.5–14.5)
WBC: 13.2 10*3/uL — ABNORMAL HIGH (ref 3.8–10.6)

## 2014-10-27 LAB — HEPATIC FUNCTION PANEL A (ARMC)
Albumin: 4.1 g/dL
Alkaline Phosphatase: 84 U/L
Bilirubin,Total: <0.1 mg/dL — ABNORMAL LOW
TOTAL PROTEIN: 5.1 g/dL — AB

## 2014-10-27 LAB — BASIC METABOLIC PANEL
Anion Gap: 7 (ref 7–16)
CALCIUM: 8.3 mg/dL — AB
CHLORIDE: 91 mmol/L — AB
Co2: 22 mmol/L
GLUCOSE: 318 mg/dL — AB
Potassium: 3.5 mmol/L
Sodium: 120 mmol/L — CL

## 2014-10-27 LAB — LIPASE, BLOOD

## 2014-10-27 LAB — TROPONIN I
Troponin-I: <0.03 ng/mL
Troponin-I: <0.03 ng/mL

## 2014-10-27 LAB — PROTIME-INR
INR: 0.9
PROTHROMBIN TIME: 12.4 s

## 2014-10-27 LAB — OSMOLALITY: Osmolality: 286 mOsm/kg (ref 275–295)

## 2014-10-27 LAB — PRO B NATRIURETIC PEPTIDE: B-Type Natriuretic Peptide: 62 pg/mL

## 2014-10-28 LAB — CBC WITH DIFFERENTIAL/PLATELET
BASOS ABS: 0.1 10*3/uL (ref 0.0–0.1)
Basophil %: 0.4 %
EOS ABS: 0.1 10*3/uL (ref 0.0–0.7)
Eosinophil %: 0.8 %
HCT: 37.6 % — ABNORMAL LOW (ref 40.0–52.0)
HGB: 12.7 g/dL — ABNORMAL LOW (ref 13.0–18.0)
Lymphocyte #: 2 10*3/uL (ref 1.0–3.6)
Lymphocyte %: 14 %
MCH: 29.4 pg (ref 26.0–34.0)
MCHC: 34.3 g/dL (ref 32.0–36.0)
MCV: 87 fL (ref 80–100)
Monocyte #: 1 x10 3/mm (ref 0.2–1.0)
Monocyte %: 7.4 %
Neutrophil #: 10.8 10*3/uL — ABNORMAL HIGH (ref 1.4–6.5)
Neutrophil %: 77.4 %
PLATELETS: 226 10*3/uL (ref 150–440)
RBC: 4.34 10*6/uL — ABNORMAL LOW (ref 4.40–5.90)
RDW: 13.6 % (ref 11.5–14.5)
WBC: 13.9 10*3/uL — ABNORMAL HIGH (ref 3.8–10.6)

## 2014-10-28 LAB — BASIC METABOLIC PANEL
ANION GAP: 7 (ref 7–16)
BUN: 7 mg/dL
CALCIUM: 8.4 mg/dL — AB
CREATININE: 0.48 mg/dL — AB
Chloride: 102 mmol/L
Co2: 24 mmol/L
GLUCOSE: 118 mg/dL — AB
Potassium: 3.7 mmol/L
Sodium: 133 mmol/L — ABNORMAL LOW

## 2014-10-28 LAB — LIPASE, BLOOD: Lipase: 128 U/L — ABNORMAL HIGH

## 2014-10-28 LAB — TRIGLYCERIDES: Triglycerides: >5000 mg/dL — ABNORMAL HIGH

## 2014-10-28 LAB — HEMOGLOBIN A1C: Hemoglobin A1C: 11.8 % — ABNORMAL HIGH

## 2014-10-29 LAB — CBC WITH DIFFERENTIAL/PLATELET
BASOS ABS: 0.1 10*3/uL (ref 0.0–0.1)
BASOS PCT: 1.1 %
EOS PCT: 3 %
Eosinophil #: 0.3 10*3/uL (ref 0.0–0.7)
HCT: 34 % — AB (ref 40.0–52.0)
HGB: 12.3 g/dL — AB (ref 13.0–18.0)
LYMPHS ABS: 3 10*3/uL (ref 1.0–3.6)
Lymphocyte %: 32.3 %
MCH: 31.7 pg (ref 26.0–34.0)
MCHC: 36.2 g/dL — ABNORMAL HIGH (ref 32.0–36.0)
MCV: 88 fL (ref 80–100)
Monocyte #: 0.6 x10 3/mm (ref 0.2–1.0)
Monocyte %: 6.9 %
NEUTROS PCT: 56.7 %
Neutrophil #: 5.3 10*3/uL (ref 1.4–6.5)
Platelet: 200 10*3/uL (ref 150–440)
RBC: 3.87 10*6/uL — AB (ref 4.40–5.90)
RDW: 14 % (ref 11.5–14.5)
WBC: 9.3 10*3/uL (ref 3.8–10.6)

## 2014-10-29 LAB — PHOSPHORUS: Phosphorus: 2.8 mg/dL

## 2014-10-29 LAB — BASIC METABOLIC PANEL
ANION GAP: 5 — AB (ref 7–16)
BUN: <5 mg/dL
CO2: 24 mmol/L
Calcium, Total: 7.9 mg/dL — ABNORMAL LOW
Chloride: 105 mmol/L
Creatinine: 0.52 mg/dL — ABNORMAL LOW
EGFR (African American): >60
EGFR (Non-African Amer.): >60
Glucose: 121 mg/dL — ABNORMAL HIGH
POTASSIUM: 3.2 mmol/L — AB

## 2014-10-29 LAB — TRIGLYCERIDES: TRIGLYCERIDES: 1909 mg/dL — AB

## 2014-10-29 LAB — LIPASE, BLOOD: LIPASE: 42 U/L

## 2014-10-29 LAB — MAGNESIUM: MAGNESIUM: 2.2 mg/dL

## 2014-10-30 LAB — BASIC METABOLIC PANEL
Anion Gap: 4 — ABNORMAL LOW (ref 7–16)
CO2: 25 mmol/L
CREATININE: 0.63 mg/dL
Calcium, Total: 8.2 mg/dL — ABNORMAL LOW
Chloride: 106 mmol/L
Creatinine: 0.6 mg/dL (ref <=1.3)
Glucose: 133 mg/dL — ABNORMAL HIGH
POTASSIUM: 3.2 mmol/L — AB
POTASSIUM: 3.2 mmol/L — AB (ref 3.4–5.3)
SODIUM: 135 mmol/L
Sodium: 135 mmol/L — AB (ref 137–147)

## 2014-10-30 LAB — LIPID PANEL: HDL: 20 mg/dL — AB (ref 35–70)

## 2014-10-30 LAB — HEPATIC FUNCTION PANEL
ALK PHOS: 84 U/L (ref 25–125)
BILIRUBIN, TOTAL: 0.1 mg/dL

## 2014-10-30 LAB — HEMOGLOBIN A1C: HEMOGLOBIN A1C: 11.8 % — AB (ref 4.0–6.0)

## 2014-10-30 LAB — TRIGLYCERIDES: TRIGLYCERIDES: 1098 mg/dL — AB

## 2014-11-04 ENCOUNTER — Telehealth: Payer: Self-pay | Admitting: *Deleted

## 2014-11-04 NOTE — Telephone Encounter (Signed)
Transition Care Management Follow-up Telephone Call   How have you been since you were released from the hospital? "good"    Do you understand why you were in the hospital? YES    Do you understand the discharge instrcutions? YES    Patient did not want to discuss, he stated "why do I need to answer these questions when I will be answering them again tomorrow"   Appointment time confirmed and reminded patient to bring any discharge paperwork and call with any concerns.    Items Reviewed:  Medications reviewed:   NO  Allergies reviewed: NO  Dietary changes reviewed: NO   Referrals reviewed: NO    Functional Questionnaire:   Activities of Daily Living (ADLs):   He states they are independent in the following: all  States they require assistance with the following: none    Any transportation issues/concerns?: NO    Any patient concerns? NO    Confirmed importance and date/time of follow-up visits scheduled: YES- scheduled with Dr. Charlett Blake 11/05/14   Confirmed with patient if condition begins to worsen call PCP or go to the ER.  Patient was given the Call-a-Nurse line [phone]: NO

## 2014-11-05 ENCOUNTER — Ambulatory Visit (INDEPENDENT_AMBULATORY_CARE_PROVIDER_SITE_OTHER): Payer: BLUE CROSS/BLUE SHIELD | Admitting: Family Medicine

## 2014-11-05 ENCOUNTER — Encounter: Payer: Self-pay | Admitting: Family Medicine

## 2014-11-05 VITALS — BP 122/72 | HR 90 | Temp 97.8°F | Ht 69.0 in | Wt 229.4 lb

## 2014-11-05 DIAGNOSIS — E669 Obesity, unspecified: Secondary | ICD-10-CM

## 2014-11-05 DIAGNOSIS — I10 Essential (primary) hypertension: Secondary | ICD-10-CM | POA: Diagnosis not present

## 2014-11-05 DIAGNOSIS — L409 Psoriasis, unspecified: Secondary | ICD-10-CM

## 2014-11-05 DIAGNOSIS — Z79899 Other long term (current) drug therapy: Secondary | ICD-10-CM

## 2014-11-05 DIAGNOSIS — R002 Palpitations: Secondary | ICD-10-CM

## 2014-11-05 DIAGNOSIS — E119 Type 2 diabetes mellitus without complications: Secondary | ICD-10-CM | POA: Diagnosis not present

## 2014-11-05 DIAGNOSIS — E1169 Type 2 diabetes mellitus with other specified complication: Secondary | ICD-10-CM

## 2014-11-05 DIAGNOSIS — K859 Acute pancreatitis, unspecified: Secondary | ICD-10-CM | POA: Diagnosis not present

## 2014-11-05 DIAGNOSIS — E782 Mixed hyperlipidemia: Secondary | ICD-10-CM

## 2014-11-05 DIAGNOSIS — M62830 Muscle spasm of back: Secondary | ICD-10-CM

## 2014-11-05 LAB — AMYLASE: Amylase: 36 U/L (ref 27–131)

## 2014-11-05 LAB — HEMOGLOBIN A1C: HEMOGLOBIN A1C: 10.9 % — AB (ref 4.6–6.5)

## 2014-11-05 LAB — COMPREHENSIVE METABOLIC PANEL
ALBUMIN: 3.8 g/dL (ref 3.5–5.2)
ALT: 38 U/L (ref 0–53)
AST: 29 U/L (ref 0–37)
Alkaline Phosphatase: 74 U/L (ref 39–117)
BUN: 17 mg/dL (ref 6–23)
CHLORIDE: 104 meq/L (ref 96–112)
CO2: 24 mEq/L (ref 19–32)
Calcium: 9.1 mg/dL (ref 8.4–10.5)
Creatinine, Ser: 0.77 mg/dL (ref 0.40–1.50)
GFR: 112.76 mL/min (ref >60.00)
GLUCOSE: 163 mg/dL — AB (ref 70–99)
POTASSIUM: 4.1 meq/L (ref 3.5–5.1)
Sodium: 135 mEq/L (ref 135–145)
TOTAL PROTEIN: 7.1 g/dL (ref 6.0–8.3)
Total Bilirubin: 0.6 mg/dL (ref 0.2–1.2)

## 2014-11-05 LAB — CBC
HEMATOCRIT: 40.4 % (ref 39.0–52.0)
Hemoglobin: 13.9 g/dL (ref 13.0–17.0)
MCHC: 34.4 g/dL (ref 30.0–36.0)
MCV: 87 fl (ref 78.0–100.0)
Platelets: 301 10*3/uL (ref 150.0–400.0)
RBC: 4.64 Mil/uL (ref 4.22–5.81)
RDW: 13.2 % (ref 11.5–15.5)
WBC: 8.6 10*3/uL (ref 4.0–10.5)

## 2014-11-05 LAB — LIPASE: Lipase: 43 U/L (ref 11.0–59.0)

## 2014-11-05 LAB — TSH: TSH: 3.72 u[IU]/mL (ref 0.35–4.50)

## 2014-11-05 MED ORDER — ATORVASTATIN CALCIUM 10 MG PO TABS: 10.0000 mg | ORAL_TABLET | Freq: Every day | ORAL | Status: DC

## 2014-11-05 MED ORDER — LOSARTAN POTASSIUM 25 MG PO TABS: 25.0000 mg | ORAL_TABLET | Freq: Every day | ORAL | Status: DC

## 2014-11-05 MED ORDER — METHOCARBAMOL 500 MG PO TABS: 500.0000 mg | ORAL_TABLET | Freq: Three times a day (TID) | ORAL | Status: DC | PRN

## 2014-11-05 MED ORDER — METOPROLOL TARTRATE 25 MG PO TABS: 25.0000 mg | ORAL_TABLET | Freq: Two times a day (BID) | ORAL | Status: DC

## 2014-11-05 MED ORDER — INSULIN GLARGINE 100 UNIT/ML SOLOSTAR PEN: 60.0000 [IU] | PEN_INJECTOR | Freq: Every day | SUBCUTANEOUS | Status: DC

## 2014-11-05 MED ORDER — METFORMIN HCL 1000 MG PO TABS: 1000.0000 mg | ORAL_TABLET | Freq: Two times a day (BID) | ORAL | Status: DC

## 2014-11-05 MED ORDER — WELCHOL 625 MG PO TABS: 1875.0000 mg | ORAL_TABLET | Freq: Two times a day (BID) | ORAL | Status: DC

## 2014-11-05 MED ORDER — FENOFIBRATE 160 MG PO TABS: 160.0000 mg | ORAL_TABLET | Freq: Every day | ORAL | Status: DC

## 2014-11-05 MED ORDER — GLIMEPIRIDE 1 MG PO TABS: ORAL_TABLET | ORAL | Status: DC

## 2014-11-05 MED ORDER — NOVOLOG FLEXPEN 100 UNIT/ML ~~LOC~~ SOPN: PEN_INJECTOR | SUBCUTANEOUS | Status: DC

## 2014-11-05 MED ORDER — LISINOPRIL 5 MG PO TABS: 5.0000 mg | ORAL_TABLET | Freq: Every day | ORAL | Status: DC

## 2014-11-05 MED ORDER — OMEPRAZOLE 40 MG PO CPDR: DELAYED_RELEASE_CAPSULE | ORAL | Status: DC

## 2014-11-05 NOTE — Patient Instructions (Signed)

## 2014-11-08 ENCOUNTER — Encounter: Payer: Self-pay | Admitting: Family Medicine

## 2014-11-08 LAB — QUANTIFERON TB GOLD ASSAY (BLOOD)
Interferon Gamma Release Assay: NEGATIVE
Mitogen value: >10 IU/mL
Quantiferon Nil Value: 0.03 IU/mL
Quantiferon Tb Ag Minus Nil Value: 0 IU/mL
TB Ag value: 0.03 IU/mL

## 2014-11-10 ENCOUNTER — Encounter: Payer: Self-pay | Admitting: Family Medicine

## 2014-11-10 NOTE — Assessment & Plan Note (Addendum)
[id number] unacceptable, minimize simple carbs. Increase exercise as tolerated. Increase Lantus and monitor

## 2014-11-10 NOTE — Assessment & Plan Note (Signed)
Is tolerating Humira

## 2014-11-10 NOTE — Assessment & Plan Note (Signed)
Encouraged DASH diet, decrease po intake and increase exercise as tolerated. Needs 7-8 hours of sleep nightly. Avoid trans fats, eat small, frequent meals every 4-5 hours with lean proteins, complex carbs and healthy fats. Minimize simple carbs 

## 2014-11-10 NOTE — Assessment & Plan Note (Signed)
Tolerating statin, encouraged heart healthy diet, avoid trans fats, minimize simple carbs and saturated fats. Increase exercise as tolerated 

## 2014-11-10 NOTE — Assessment & Plan Note (Signed)
Well controlled, no changes to meds. Encouraged heart healthy diet such as the DASH diet and exercise as tolerated.  °

## 2014-11-10 NOTE — Progress Notes (Signed)
[name]  [id number] [birth year] 11/10/2014      Progress Note-Follow Up  Subjective  Chief Complaint  Chief Complaint  Patient presents with  . Hospitalization Follow-up    HPI  Patient is a 54 y.o. male in today for routine medical care. Patient is in today for hospital follow-up. Presented to the ER with severe back pain that he denies is caused by injury. Currently is pain-free but required several medications in the ER for his pain resolved. He denies any changes in bowel habits or radicular symptoms. No incontinence. Denies CP/palp/SOB/HA/congestion/fevers/GI or GU c/o. Taking meds as prescribed  Past Medical History  Diagnosis Date  . Obesity   . Iron deficiency anemia 2011  . Psoriasis   . Hepatic artery stenosis   . Colon cancer     right colon cancer- adenocarcinoma CEA level isnrmal at 1.8  . Diabetes mellitus   . Hypertension   . Hyperlipidemia   . Hypertriglyceridemia 12/14/2010  . Preventative health care 10/08/2012    Past Surgical History  Procedure Laterality Date  . Cholecystectomy  1994  . Hemicolectomy      12/17/2009 right  . Colonoscopy    . Polypectomy    . Carpal tunnel release      RIGHT    Family History  Problem Relation Age of Onset  . Stomach cancer Mother     diedinher 66's  . Diabetes type II Brother     boderline  . Diabetes Brother     type II  . Other Neg Hx     cad,prostate ca, colon ca  . Coronary artery disease Neg Hx   . Cancer Neg Hx     colon, prostate  . Alcohol abuse Brother     History   Social History  . Marital Status: Single    Spouse Name: [name]  . Number of Children: 0  . Years of Education: N/A   Occupational History  . Chartered certified accountant and fax   Social History Main Topics  . Smoking status: Current Some Day Smoker    Types: Cigarettes  . Smokeless tobacco: Never Used  . Alcohol Use: No  . Drug Use: No  . Sexual Activity: Yes   Other Topics Concern  . Not on file   Social  History Narrative   Occupation: Theatre stage manager and fax)   Single  (lives with [name])   no children    former smoker   Illicit Drug Use - no     Current Outpatient Prescriptions on File Prior to Visit  Medication Sig Dispense Refill  . B-D ULTRAFINE III SHORT PEN 31G X 8 MM MISC TAKE AS DIRECTED 100 each 0  . Clobetasol Prop Emollient Base 0.05 % emollient cream Apply topically daily as needed.      Marland Kitchen CLOBEX SPRAY 0.05 % external spray     . diazepam (VALIUM) 5 MG tablet Take 1 tablet (5 mg total) by mouth every 12 (twelve) hours as needed for anxiety. 60 tablet 0  . glucose blood (ONE TOUCH TEST STRIPS) test strip OneTouch Ultra Blue. Use to check blood sugar once a day. Dx: 250.00 100 each 3  . insulin glargine (LANTUS) 100 UNIT/ML injection Inject 0.6 mLs (60 Units total) into the skin at bedtime. 1 pen 6  . ONETOUCH DELICA LANCETS MISC     . etanercept (ENBREL) 50 MG/ML injection Inject 50 mg into the skin once a week. Inject twice per week -hold     . [  DISCONTINUED] traZODone (DESYREL) 50 MG tablet Take 1 tablet (50 mg total) by mouth at bedtime as needed. 30 tablet 5   No current facility-administered medications on file prior to visit.    No Known Allergies  Review of Systems  Review of Systems  Constitutional: Negative for fever and malaise/fatigue.  HENT: Negative for congestion.   Eyes: Negative for discharge.  Respiratory: Negative for shortness of breath.   Cardiovascular: Negative for chest pain, palpitations and leg swelling.  Gastrointestinal: Negative for nausea, abdominal pain and diarrhea.  Genitourinary: Negative for dysuria.  Musculoskeletal: Positive for back pain and joint pain. Negative for falls.  Skin: Negative for rash.  Neurological: Negative for loss of consciousness and headaches.  Endo/Heme/Allergies: Negative for polydipsia.  Psychiatric/Behavioral: Negative for depression and suicidal ideas. The patient is not nervous/anxious and does not  have insomnia.     Objective  BP 122/72 mmHg  Pulse 90  Temp(Src) 97.8 F (36.6 C) (Oral)  Ht 5\' 9"  (1.753 m)  Wt 229 lb 6 oz (104.044 kg)  BMI 33.86 kg/m2  SpO2 95%  Physical Exam  Physical Exam  Constitutional: He is oriented to person, place, and time and well-developed, well-nourished, and in no distress. No distress.  HENT:  Head: Normocephalic and atraumatic.  Eyes: Conjunctivae are normal.  Neck: Neck supple. No thyromegaly present.  Cardiovascular: Normal rate, regular rhythm and normal heart sounds.   No murmur heard. Pulmonary/Chest: Effort normal and breath sounds normal. No respiratory distress.  Abdominal: He exhibits no distension and no mass. There is no tenderness.  Musculoskeletal: He exhibits no edema.  Neurological: He is alert and oriented to person, place, and time.  Skin: Skin is warm.  Psychiatric: Memory, affect and judgment normal.    Lab Results  Component Value Date   TSH 3.72 11/05/2014   Lab Results  Component Value Date   WBC 8.6 11/05/2014   HGB 13.9 11/05/2014   HCT 40.4 11/05/2014   MCV 87.0 11/05/2014   PLT 301.0 11/05/2014   Lab Results  Component Value Date   CREATININE 0.77 11/05/2014   BUN 17 11/05/2014   NA 135 11/05/2014   K 4.1 11/05/2014   CL 104 11/05/2014   CO2 24 11/05/2014   Lab Results  Component Value Date   ALT 38 11/05/2014   AST 29 11/05/2014   ALKPHOS 74 11/05/2014   BILITOT 0.6 11/05/2014   Lab Results  Component Value Date   CHOL 115 10/11/2013   Lab Results  Component Value Date   HDL 20* 10/30/2014   Lab Results  Component Value Date   LDLCALC 42 10/11/2013   Lab Results  Component Value Date   TRIG 194* 10/11/2013   Lab Results  Component Value Date   CHOLHDL 3.4 10/11/2013     Assessment & Plan  Essential hypertension Well controlled, no changes to meds. Encouraged heart healthy diet such as the DASH diet and exercise as tolerated.    Diabetes mellitus type 2 in  obese hgba1c unacceptable, minimize simple carbs. Increase exercise as tolerated. Increase Lantus and monitor   Hyperlipidemia, mixed Tolerating statin, encouraged heart healthy diet, avoid trans fats, minimize simple carbs and saturated fats. Increase exercise as tolerated   Obesity Encouraged DASH diet, decrease po intake and increase exercise as tolerated. Needs 7-8 hours of sleep nightly. Avoid trans fats, eat small, frequent meals every 4-5 hours with lean proteins, complex carbs and healthy fats. Minimize simple carbs   Psoriasis Is tolerating Humira  Muscle spasm of back Encouraged moist heat and gentle stretching as tolerated. May try NSAIDs and prescription meds as directed and report if symptoms worsen or seek immediate care. Allowed methocarbimol prn

## 2014-11-10 NOTE — Assessment & Plan Note (Addendum)
Encouraged moist heat and gentle stretching as tolerated. May try NSAIDs and prescription meds as directed and report if symptoms worsen or seek immediate care. Allowed methocarbimol prn

## 2014-11-17 NOTE — Consult Note (Signed)
PATIENT NAME:  [name], [name] MR#:  [id number] DATE OF BIRTH:  [birth year]  DATE OF CONSULTATION:  10/28/2014  REQUESTING PHYSICIAN:   Gladstone Lighter, MD  CONSULTING PHYSICIAN:  A. Lavone Orn, MD   CHIEF COMPLAINT:  Hypertriglyceridemia.   HISTORY OF PRESENT ILLNESS: This is a 54 year old male seen in consultation at the request of Dr. Tressia Miners for hypertriglyceridemia. The patient was admitted yesterday with 2 weeks abdominal pain, found to have acute pancreatitis in the setting of severely uncontrolled hypertriglyceridemia with triglyceride level greater than 5000.  The patient is hemodynamically stable with normal hematocrit and no other significant abnormalities other than an elevated blood sugar of 318.  The patient admitted to the Critical Care Unit, started on IV fluids and IV insulin. Blood sugars gradually improved.  IV dextrose has been added.  He has been kept n.p.o.      The patient with a 5 year history of type 2 diabetes. No known prior episodes of acute pancreatitis.  Due to losing medical insurance, he had not been taking any medications for diabetes in over 1 year.  Prior to that, he had been on a regimen of metformin and insulin, doses and details were not were recalled.  He does have a glucometer.  He does not check blood sugars. Current hemoglobin A1c is 11.8% and diabetes is uncontrolled. Reports abdominal pain is improved, but is persistent. Tried to eat ice chips earlier today which seemed to make abdominal pain worse. He had emesis yesterday. Has persistent nausea.   PAST MEDICAL HISTORY:  1.  Obesity.  2.  Type 2 diabetes mellitus.  3.  Tobacco dependence.  4.  Psoriasis.  5.  History of colon cancer status post surgery.   OUTPATIENT MEDICATIONS:  Humira.  INPATIENT MEDICATIONS:  1.  IV insulin, currently at a rate of 7 units an hour.  2.  D5 NS at 100 mL per hour.  3.  TriCor 145 mg daily.  4.  Subcutaneous heparin 5000 units b.i.d.   SOCIAL HISTORY: The  patient is engaged. He is employed.  Smokes of cigarettes, down to about 1 pack per week. Trying to quit. No alcohol use.   FAMILY HISTORY: Brother and sister both with diabetes. Father with history of coronary artery disease, status post stent, now deceased. Mother with history of stomach cancer, now deceased.   REVIEW OF SYSTEMS:  GENERAL: No weight loss. No fever.  HEENT: No blurred vision. No sore throat.  NECK: No neck pain. No dysphasia.  CARDIAC: Reports occasional sense of pounding of the chest, no chest pain.  PULMONARY: No cough. No shortness of breath.  ABDOMEN: As per history of present illness.  GENITOURINARY: Denies dysuria or polyuria.  ENDOCRINE: Denies heat or cold intolerance.  SKIN: Denies rash or recent skin changes.  NEUROLOGIC: No headaches or falls.   PHYSICAL EXAMINATION:  VITAL SIGNS: Height 68.7 inches, weight 228 pounds, BMI 33.7, temperature 98.6, pulse 79, blood pressure 114/71, respirations 15, pulse oximetry 94% on room air.  GENERAL: A white male in no acute distress.  HEENT: EOMI.  No proptosis.  Oropharynx is clear. Mucous membranes moist.  NECK: Supple. No appreciable thyromegaly. No thyroid bruit.  CARDIAC: Regular rate and rhythm without murmur.  PULMONARY: Clear to auscultation bilaterally. No wheeze.  ABDOMEN: Positive bowel sounds, soft, no rebound.  EXTREMITIES: No peripheral edema is present. Normal motor tone throughout.  SKIN: No xanthomas. No rash.  NEUROLOGIC: No dysarthria. No tremor.    LABORATORY DATA:  Glucose 118,  BUN 17, creatinine 0.48, sodium 133, potassium 3.7, chloride 102, bicarbonate 24. Hemoglobin A1c 11.8%. Triglycerides greater than 5000.  Lipase 128, hemoglobin 12.7, hematocrit 37.6, WBC 13.9, platelets 226,000.   RADIOLOGY:  1.  CT abdomen and pelvis, noncontrast 10/27/2014, significant for faint peri pancreatic head fat stranding, status post right hemicolectomy, status post cholecystectomy, hepatic steatosis.  2.   Portable single view chest x-ray 10/27/2014, shows no active disease.   ASSESSMENT:  1.  Acute pancreatitis in setting of severely uncontrolled hypertriglyceridemia.  2.  Hypertriglyceridemia.  3.  Uncontrolled diabetes mellitus.  4.  Obesity.  5.  Tobacco dependence.   RECOMMENDATIONS:  1.  Agree with course of care to include IV insulin, IV dextrose and close monitoring. Aim to keep blood sugars in the 100 to 180 range.  2.  Keep n.p.o. for now.  3.  Plan to continue IV insulin until serum triglycerides less than 1000.  4.  Once triglycerides less than 1000, we can transition to subcutaneous insulin, with anticipated basal insulin (Lantus or Levemir) and mealtime insulin (Humalog or NovoLog) in addition to a before meals and at bedtime sliding scale. I can assist with transition, once clinically improved and ready to make this change.  5.  Agree with use of TriCor.  6.  Could consider adding fish oil, although may do this at a later date once tolerating p.o.  7.  Also could consider adding metformin, although I also anticipate doing this at a later date once tolerating p.o.  8.  Counseled the patient on the importance of monitoring of blood sugars and management of diabetes. Counseled him this is a lifelong disease and will require close outpatient follow-up. He seems to be in agreement.  9.  Advised the patient about the importance of tobacco cessation.   The patient is critically ill, although clinically improving now.   Time Spent with patient as well as with his fiance, including counseling and coordinating care was greater than 55 minutes.       ____________________________ A. Lavone Orn, MD ams:DT D: 10/28/2014 17:29:46 ET T: 10/28/2014 17:54:44 ET JOB#: [id number]  cc: A. Lavone Orn, MD, <Dictator> Sherlon Handing MD ELECTRONICALLY SIGNED 10/29/2014 22:30

## 2014-11-17 NOTE — Discharge Summary (Signed)
PATIENT NAME:  [name], [name] MR#:  [id number] DATE OF BIRTH:  [birth year]  DATE OF ADMISSION:  10/27/2014 DATE OF DISCHARGE:  10/30/2014  ADMITTING DIAGNOSIS: Abdominal pain.   DISCHARGE DIAGNOSES: 1.  Abdominal pain due to acute pancreatitis.  2.  Acute pancreatitis as a result of hypertriglyceridemia suspected due to Humira therapy.  3.  Diabetes with hyperglycemia due to his hypertriglyceridemia. He was treated with an insulin drip.  4.  Psoriasis of the skin.   CONSULTANTS: Dr. Gabriel Carina.   PERTINENT LABORATORIES AND EVALUATIONS: Admitting glucose was 318, BUN 62, sodium was 120, potassium 3.5, chloride was 91, CO2 of 22. Triglycerides were greater than 5000. Lipase was too high to be detected. LFTs, total protein 5.1, albumin 4.1, bilirubin total less than 0.1. Troponin was less than 0.03. WBC 13.2, hemoglobin 15.1, platelet count was 292. The patient's triglycerides were 1098 on the day of discharge. CT of the abdomen and pelvis showed faint peripancreatic head fat stranding.   HOSPITAL COURSE: Please refer to H and P done by the admitting physician. The patient is a 53 year old white male who had been receiving Humira for his treatment for psoriasis, who presented with abdominal pain. The patient was having significant abdominal pain and had a triglyceride level checked, which was greater than 5000. His lipase was also elevated suggestive of acute pancreatitis. The patient was admitted to the hospital and placed in the CCU due to his severe hypertriglyceridemia. He was started on insulin drip. He was kept n.p.o. and started on medications for his hypertriglyceridemia. He was seen by Dr. Gabriel Carina of endocrinology, who followed the patient throughout the hospitalization. The patient's abdominal pain subsided after 2 days. He was started on a clear liquid diet. His triglyceride started trending down; therefore, the insulin was discontinued. The patient needs to discuss with this dermatologist regarding  Humira therapy to consider stopping that because that could cause hypertriglyceridemia. At this time, the patient is doing much better and is stable for discharge.   DISCHARGE MEDICATIONS: Lantus 50 units daily, Humulin R sliding scale, fenofibrate 145 p.o. daily, colesevelam 625 with  6 tabs daily, insulin 10 units t.i.d., Ambien 10 at bedtime.   DIET: Low-sodium, low-fat, low-cholesterol, carbohydrate-controlled diet.   ACTIVITY: As tolerated.   FOLLOWUP: With Dr. Gabriel Carina in 1 to 2 weeks. Follow with primary dermatologist in 1 to 2 weeks.   TIME SPENT ON THIS DISCHARGE: 35 minutes.    ____________________________ Lafonda Mosses Posey Pronto, MD shp:at D: 11/12/2014 15:16:39 ET T: 11/12/2014 17:32:06 ET JOB#: [id number]  cc: Madalen Gavin H. Posey Pronto, MD, <Dictator> Alric Seton MD ELECTRONICALLY SIGNED 11/15/2014 14:40

## 2014-11-17 NOTE — H&P (Signed)
PATIENT NAME:  [name], [name]:  [id number] DATE OF BIRTH:  1961-01-15  DATE OF ADMISSION:  10/27/2014  ADMITTING PHYSICIAN: Gladstone Lighter, MD    PRIMARY CARE PHYSICIAN: [facility] in [place]   PRIMARY DERMATOLOGIST: Dr. Marko Stai   CHIEF COMPLAINT: Chest and abdominal pain.   HISTORY OF PRESENT ILLNESS: [name] is a 54 year old obese Caucasian male with past medical history significant for type 2 diabetes mellitus, insulin-dependent, but has not been taking medications for almost a year, history of dermal psoriasis, on Humira every 2 weeks currently,  history of colon cancer status post resection and currently in remission, presents to the hospital secondary to worsening lower chest and epigastric pain radiating to the back for at least the last 2 to 3 weeks. The patient has never had pancreatitis before, denies any alcohol use. No prior cardiac history. He denies nausea, vomiting, diaphoresis. He says for the last 3 weeks he has been having like a crushing pain in the chest as if he is being squeezed from front to back in the lower chest and epigastric region. He also had diffuse nonspecific tenderness all over the belly on exam today. His blood work is pending because his blood draw in the tubes has been  extremely lipemic at this point. Chest x-ray did not reveal any significant findings. Troponins were negative first set and CT showed maybe acute pancreatitis. The patient is being admitted for possible acute pancreatitis secondary to hyperlipidemia but his laboratories are still pending at this time and it might take a longer time because the triglyceride level might be very high.   PAST MEDICAL HISTORY: 1.  Dermal psoriasis.  2.  Type 2 diabetes mellitus, insulin-dependent. Has not been taking medications recently.  3.  History of colon cancer, currently in remission,   PAST SURGICAL HISTORY:  1.  Colectomy.   2.  Cholecystectomy.   ALLERGIES TO MEDICATIONS: No  known drug allergies.   CURRENT HOME MEDICATIONS: Humira one shot subcutaneous every 2 weeks, last dose on 10/22/2014. The patient was just started on Humira in the last 3 to 4 months. Before that he had been on Enbrel for a long time.   SOCIAL HISTORY: Lives at home with his fiance. Smokes about 1/4 pack every day. No alcohol use.   FAMILY HISTORY: Mom died from stomach cancer. Father passed away but he was very old when he passed in his 11s.   REVIEW OF SYSTEMS:  CONSTITUTIONAL: No fever, fatigue or weakness.  EYES: No blurred vision, double vision, inflammation, or glaucoma.  EARS, NOSE, AND THROAT: No tinnitus, ear pain, hearing loss, epistaxis, or discharge.  RESPIRATORY: No cough, wheeze, hemoptysis, or COPD.  CARDIOVASCULAR: Positive for chest pain. No orthopnea, edema, dizziness, palpitations, or syncope.  GASTROINTESTINAL: Positive for epigastric pain. No nausea, vomiting, diarrhea, hematemesis, or melena.  GENITOURINARY: No dysuria, hematuria, renal calculus, frequency, or incontinence.  ENDOCRINE: No polyuria, nocturia, thyroid problems, heat or cold intolerance.  HEMATOLOGY: No anemia, easy bruising or bleeding.  SKIN: No acne, rash, or lesions. MUSCULOSKELETAL: Positive for low back pain and also neck pain. No arthritis or gout.  NEUROLOGIC: No numbness, weakness, CVA, TIA or seizures.  PSYCHOLOGICAL: No anxiety, insomnia, or depression.   PHYSICAL EXAMINATION:  VITAL SIGNS: Temperature 98.7 degrees Fahrenheit, pulse 95, respirations 18, blood pressure 166/87, pulse oxygenation 97% on room air.  GENERAL: A heavily built, well-nourished male, lying in bed, not in any acute distress.  HEENT: Normocephalic, atraumatic. Pupils equal, round, reacting to  light. Anicteric sclerae. Extraocular movements intact. Oropharynx clear without erythema, mass or exudate.   NECK: Supple. No thyromegaly, JVD or carotid bruits. No lymphadenopathy.  LUNGS: Moving air bilaterally. No wheezes or  crackles. No use of accessory muscles for breathing.  CARDIOVASCULAR: S1, S2, regular rate and rhythm. No murmurs, rubs, or gallops.  ABDOMEN: Obese. Mild tenderness in the epigastric region. He had diffuse tenderness earlier that has resolved with morphine now. No rebound tenderness or guarding or rigidity. Normal bowel sounds present.  EXTREMITIES: No pedal edema. No clubbing or cyanosis, 2+ dorsalis pedis pulses palpable bilaterally.  SKIN: No acne, rash or lesions.  LYMPHATICS: No cervical lymphadenopathy.  NEUROLOGIC: Cranial nerves intact. No focal motor or sensory deficit.  PSYCHIATRIC: The patient is awake, alert, oriented x 3.   LABORATORY DATA: WBC 13.2, hemoglobin 15.1, hematocrit 41.7, platelet count 292,000. Sodium 120, potassium 3.5, chloride 91, bicarbonate 22, BUN and creatinine are pending. Glucose 318, calcium of 8.3.   BNP is normal at 62. CK is pending. CK-MB 1.3. Troponin is negative. INR 0.9 and lipase is still pending.   Chest x-ray showing clear lung fields. No consolidation, effusion. No active cardiopulmonary disease. CT of the abdomen and pelvis without contrast showing faint peripancreatic head fat stranding status post right hemicolectomy, status post cholecystectomy and hepatic steatosis noted. EKG: Normal sinus rhythm. No acute ST-T wave abnormalities.   ASSESSMENT AND PLAN: A 54 year old male with a history dermal psoriasis, on Humira, diabetes, not taking medications, comes with lower chest pain and epigastric pain for 2 to 3 weeks.  Laboratories are pending, highly lipemic blood. CT abdomen with acute pancreatitis.  1.  Acute pancreatitis. No alcohol use history, likely from hypertriglyceridemia. Again, laboratories are pending. No previous history. Admit to ICU stepdown for insulin drip for possible hypertriglyceridemia. Repeat triglycerides in the a.m. and a consult for managing the drip, n.p.o., IV fluids, pain medications p.r.n. Troponins are negative but will  recycle troponins x 3. No EKG changes. Especially has a complaint of chest pain and if he has hyperlipidemia, he is also at high risk for atherosclerotic heart disease.  2.  Hyponatremia. Could be due to pseudohyponatremia from increased osmolality of blood from his triglycerides. Check serum osmolarity and start on IV normal saline anyways. Continue to follow.  3.  Psoriasis of skin. Following with dermatology, on Humira every 2 weeks, last dose was 10/22/2014. Humira was started recently, has been on Enbrel for several years before that. Humira has the side-effect of causing hyperlipidemia, upto 5-10% chance. We will hold off on that until further laboratories are available or further information is available at this time. Anyways, he is not due for any dose for the next 10 days.  4.  Diabetes mellitus. Was on Lantus and metformin in the past. Did not take any medications for greater than a year now. Hemoglobin A1c pending. The patient is on insulin drip.  5.  Deep vein thrombosis prophylaxis with subcutaneous heparin.   CODE STATUS: Full code.   TOTAL CRITICAL CARE TIME SPENT ON ADMITTING THIS PATIENT: 60 minutes.   ____________________________ Gladstone Lighter, MD rk:at D: 10/27/2014 19:14:16 ET T: 10/27/2014 21:48:08 ET JOB#: [id number]  cc: Gladstone Lighter, MD, <Dictator> Dr. Marko Stai [facility] in [facility] MD ELECTRONICALLY SIGNED 11/08/2014 14:50

## 2014-11-25 ENCOUNTER — Encounter: Payer: Self-pay | Admitting: Family Medicine

## 2014-11-26 ENCOUNTER — Other Ambulatory Visit: Payer: Self-pay | Admitting: Family Medicine

## 2014-11-26 ENCOUNTER — Telehealth: Payer: Self-pay | Admitting: Family Medicine

## 2014-11-26 DIAGNOSIS — F419 Anxiety disorder, unspecified: Secondary | ICD-10-CM

## 2014-11-26 MED ORDER — DIAZEPAM 5 MG PO TABS: 5.0000 mg | ORAL_TABLET | Freq: Two times a day (BID) | ORAL | Status: DC | PRN

## 2014-11-26 NOTE — Telephone Encounter (Signed)
Called the patient left message to call back 

## 2014-11-26 NOTE — Telephone Encounter (Signed)
Printed valium 5 mg # 60 with 0 refills as patient requested per [employer] and PCP did ok.. Prescription is on counter for signature of PCP.  Will fax to [facility] in [place] as requested by the patient.

## 2014-11-26 NOTE — Telephone Encounter (Signed)
Pt returning your call. Please call back at [phone]. He said he will be available today.

## 2014-12-04 ENCOUNTER — Telehealth: Payer: Self-pay | Admitting: Family Medicine

## 2014-12-04 NOTE — Telephone Encounter (Signed)
Caller name: Claron Rosencrans Relationship to patient: self Can be reached: [phone] Pharmacy: [facility] in [place]   Reason for call: Pt called for refills on Bunk Foss test strip [employer] Med Name: ONE TOUCH VERIO TEST ST(NEW)100'S]. He states he is dangerously low. States he is testing 4-5 times per day. Pt states the endocrinology orginally ordered when pt was in [facility] for 4 days in April 2016.

## 2014-12-22 ENCOUNTER — Encounter: Payer: Self-pay | Admitting: Family Medicine

## 2014-12-23 ENCOUNTER — Other Ambulatory Visit: Payer: Self-pay | Admitting: Family Medicine

## 2014-12-23 ENCOUNTER — Telehealth: Payer: Self-pay | Admitting: Family Medicine

## 2014-12-23 MED ORDER — ATORVASTATIN CALCIUM 10 MG PO TABS: 10.0000 mg | ORAL_TABLET | Freq: Every day | ORAL | Status: DC

## 2014-12-23 NOTE — Telephone Encounter (Signed)
Pre Visit letter sent  °

## 2015-01-09 ENCOUNTER — Telehealth: Payer: Self-pay | Admitting: Behavioral Health

## 2015-01-09 NOTE — Telephone Encounter (Signed)
Unable to reach patient at time of Pre-Visit Call.  Left message for patient to return call when available.    

## 2015-01-10 ENCOUNTER — Ambulatory Visit (INDEPENDENT_AMBULATORY_CARE_PROVIDER_SITE_OTHER): Payer: BLUE CROSS/BLUE SHIELD | Admitting: Family Medicine

## 2015-01-10 ENCOUNTER — Encounter: Payer: Self-pay | Admitting: Family Medicine

## 2015-01-10 VITALS — BP 122/64 | HR 92 | Temp 98.0°F | Ht 68.0 in | Wt 243.1 lb

## 2015-01-10 DIAGNOSIS — I10 Essential (primary) hypertension: Secondary | ICD-10-CM | POA: Diagnosis not present

## 2015-01-10 DIAGNOSIS — E782 Mixed hyperlipidemia: Secondary | ICD-10-CM

## 2015-01-10 DIAGNOSIS — E119 Type 2 diabetes mellitus without complications: Secondary | ICD-10-CM

## 2015-01-10 DIAGNOSIS — K859 Acute pancreatitis, unspecified: Secondary | ICD-10-CM

## 2015-01-10 DIAGNOSIS — M62830 Muscle spasm of back: Secondary | ICD-10-CM

## 2015-01-10 DIAGNOSIS — Z0000 Encounter for general adult medical examination without abnormal findings: Secondary | ICD-10-CM

## 2015-01-10 DIAGNOSIS — R002 Palpitations: Secondary | ICD-10-CM

## 2015-01-10 DIAGNOSIS — E1169 Type 2 diabetes mellitus with other specified complication: Secondary | ICD-10-CM

## 2015-01-10 DIAGNOSIS — K219 Gastro-esophageal reflux disease without esophagitis: Secondary | ICD-10-CM

## 2015-01-10 DIAGNOSIS — R000 Tachycardia, unspecified: Secondary | ICD-10-CM

## 2015-01-10 DIAGNOSIS — F419 Anxiety disorder, unspecified: Secondary | ICD-10-CM | POA: Diagnosis not present

## 2015-01-10 DIAGNOSIS — L409 Psoriasis, unspecified: Secondary | ICD-10-CM

## 2015-01-10 DIAGNOSIS — C189 Malignant neoplasm of colon, unspecified: Secondary | ICD-10-CM

## 2015-01-10 DIAGNOSIS — G4700 Insomnia, unspecified: Secondary | ICD-10-CM

## 2015-01-10 DIAGNOSIS — E669 Obesity, unspecified: Secondary | ICD-10-CM

## 2015-01-10 MED ORDER — FENOFIBRATE 160 MG PO TABS: 160.0000 mg | ORAL_TABLET | Freq: Every day | ORAL | Status: DC

## 2015-01-10 MED ORDER — LOSARTAN POTASSIUM 25 MG PO TABS: 25.0000 mg | ORAL_TABLET | Freq: Every day | ORAL | Status: DC

## 2015-01-10 MED ORDER — METFORMIN HCL 1000 MG PO TABS: 1000.0000 mg | ORAL_TABLET | Freq: Two times a day (BID) | ORAL | Status: DC

## 2015-01-10 MED ORDER — LISINOPRIL 5 MG PO TABS: 5.0000 mg | ORAL_TABLET | Freq: Every day | ORAL | Status: DC

## 2015-01-10 MED ORDER — INSULIN PEN NEEDLE 31G X 8 MM MISC: Status: DC

## 2015-01-10 MED ORDER — INSULIN GLARGINE 100 UNIT/ML SOLOSTAR PEN: 60.0000 [IU] | PEN_INJECTOR | Freq: Every day | SUBCUTANEOUS | Status: DC

## 2015-01-10 MED ORDER — METOPROLOL TARTRATE 25 MG PO TABS: 25.0000 mg | ORAL_TABLET | Freq: Two times a day (BID) | ORAL | Status: DC

## 2015-01-10 MED ORDER — ZOLPIDEM TARTRATE 10 MG PO TABS
10.0000 mg | ORAL_TABLET | Freq: Every evening | ORAL | Status: DC | PRN
Start: 2015-01-10 — End: 2015-08-09

## 2015-01-10 MED ORDER — OMEPRAZOLE 40 MG PO CPDR: DELAYED_RELEASE_CAPSULE | ORAL | Status: DC

## 2015-01-10 MED ORDER — GLIMEPIRIDE 1 MG PO TABS: ORAL_TABLET | ORAL | Status: DC

## 2015-01-10 MED ORDER — DIAZEPAM 5 MG PO TABS: 5.0000 mg | ORAL_TABLET | Freq: Two times a day (BID) | ORAL | Status: DC | PRN

## 2015-01-10 MED ORDER — ROBAXIN 500 MG PO TABS: 500.0000 mg | ORAL_TABLET | Freq: Three times a day (TID) | ORAL | Status: DC | PRN

## 2015-01-10 NOTE — Patient Instructions (Signed)
Preventive Care for Adults A healthy lifestyle and preventive care can promote health and wellness. Preventive health guidelines for men include the following key practices:  A routine yearly physical is a good way to check with your health care provider about your health and preventative screening. It is a chance to share any concerns and updates on your health and to receive a thorough exam.  Visit your dentist for a routine exam and preventative care every 6 months. Brush your teeth twice a day and floss once a day. Good oral hygiene prevents tooth decay and gum disease.  The frequency of eye exams is based on your age, health, family medical history, use of contact lenses, and other factors. Follow your health care provider's recommendations for frequency of eye exams.  Eat a healthy diet. Foods such as vegetables, fruits, whole grains, low-fat dairy products, and lean protein foods contain the nutrients you need without too many calories. Decrease your intake of foods high in solid fats, added sugars, and salt. Eat the right amount of calories for you.Get information about a proper diet from your health care provider, if necessary.  Regular physical exercise is one of the most important things you can do for your health. Most adults should get at least 150 minutes of moderate-intensity exercise (any activity that increases your heart rate and causes you to sweat) each week. In addition, most adults need muscle-strengthening exercises on 2 or more days a week.  Maintain a healthy weight. The body mass index (BMI) is a screening tool to identify possible weight problems. It provides an estimate of body fat based on height and weight. Your health care provider can find your BMI and can help you achieve or maintain a healthy weight.For adults 20 years and older:  A BMI below 18.5 is considered underweight.  A BMI of 18.5 to 24.9 is normal.  A BMI of 25 to 29.9 is considered overweight.  A BMI  of 30 and above is considered obese.  Maintain normal blood lipids and cholesterol levels by exercising and minimizing your intake of saturated fat. Eat a balanced diet with plenty of fruit and vegetables. Blood tests for lipids and cholesterol should begin at age 50 and be repeated every 5 years. If your lipid or cholesterol levels are high, you are over 50, or you are at high risk for heart disease, you may need your cholesterol levels checked more frequently.Ongoing high lipid and cholesterol levels should be treated with medicines if diet and exercise are not working.  If you smoke, find out from your health care provider how to quit. If you do not use tobacco, do not start.  Lung cancer screening is recommended for adults aged 73-80 years who are at high risk for developing lung cancer because of a history of smoking. A yearly low-dose CT scan of the lungs is recommended for people who have at least a 30-pack-year history of smoking and are a current smoker or have quit within the past 15 years. A pack year of smoking is smoking an average of 1 pack of cigarettes a day for 1 year (for example: 1 pack a day for 30 years or 2 packs a day for 15 years). Yearly screening should continue until the smoker has stopped smoking for at least 15 years. Yearly screening should be stopped for people who develop a health problem that would prevent them from having lung cancer treatment.  If you choose to drink alcohol, do not have more than  2 drinks per day. One drink is considered to be 12 ounces (355 mL) of beer, 5 ounces (148 mL) of wine, or 1.5 ounces (44 mL) of liquor.  Avoid use of street drugs. Do not share needles with anyone. Ask for help if you need support or instructions about stopping the use of drugs.  High blood pressure causes heart disease and increases the risk of stroke. Your blood pressure should be checked at least every 1-2 years. Ongoing high blood pressure should be treated with  medicines, if weight loss and exercise are not effective.  If you are 45-79 years old, ask your health care provider if you should take aspirin to prevent heart disease.  Diabetes screening involves taking a blood sample to check your fasting blood sugar level. This should be done once every 3 years, after age 45, if you are within normal weight and without risk factors for diabetes. Testing should be considered at a younger age or be carried out more frequently if you are overweight and have at least 1 risk factor for diabetes.  Colorectal cancer can be detected and often prevented. Most routine colorectal cancer screening begins at the age of 50 and continues through age 75. However, your health care provider may recommend screening at an earlier age if you have risk factors for colon cancer. On a yearly basis, your health care provider may provide home test kits to check for hidden blood in the stool. Use of a small camera at the end of a tube to directly examine the colon (sigmoidoscopy or colonoscopy) can detect the earliest forms of colorectal cancer. Talk to your health care provider about this at age 50, when routine screening begins. Direct exam of the colon should be repeated every 5-10 years through age 75, unless early forms of precancerous polyps or small growths are found.  People who are at an increased risk for hepatitis B should be screened for this virus. You are considered at high risk for hepatitis B if:  You were born in a country where hepatitis B occurs often. Talk with your health care provider about which countries are considered high risk.  Your parents were born in a high-risk country and you have not received a shot to protect against hepatitis B (hepatitis B vaccine).  You have HIV or AIDS.  You use needles to inject street drugs.  You live with, or have sex with, someone who has hepatitis B.  You are a man who has sex with other men (MSM).  You get hemodialysis  treatment.  You take certain medicines for conditions such as cancer, organ transplantation, and autoimmune conditions.  Hepatitis C blood testing is recommended for all people born from 1945 through 1965 and any individual with known risks for hepatitis C.  Practice safe sex. Use condoms and avoid high-risk sexual practices to reduce the spread of sexually transmitted infections (STIs). STIs include gonorrhea, chlamydia, syphilis, trichomonas, herpes, HPV, and human immunodeficiency virus (HIV). Herpes, HIV, and HPV are viral illnesses that have no cure. They can result in disability, cancer, and death.  If you are at risk of being infected with HIV, it is recommended that you take a prescription medicine daily to prevent HIV infection. This is called preexposure prophylaxis (PrEP). You are considered at risk if:  You are a man who has sex with other men (MSM) and have other risk factors.  You are a heterosexual man, are sexually active, and are at increased risk for HIV infection.    You take drugs by injection.  You are sexually active with a partner who has HIV.  Talk with your health care provider about whether you are at high risk of being infected with HIV. If you choose to begin PrEP, you should first be tested for HIV. You should then be tested every 3 months for as long as you are taking PrEP.  A one-time screening for abdominal aortic aneurysm (AAA) and surgical repair of large AAAs by ultrasound are recommended for men ages 32 to 67 years who are current or former smokers.  Healthy men should no longer receive prostate-specific antigen (PSA) blood tests as part of routine cancer screening. Talk with your health care provider about prostate cancer screening.  Testicular cancer screening is not recommended for adult males who have no symptoms. Screening includes self-exam, a health care provider exam, and other screening tests. Consult with your health care provider about any symptoms  you have or any concerns you have about testicular cancer.  Use sunscreen. Apply sunscreen liberally and repeatedly throughout the day. You should seek shade when your shadow is shorter than you. Protect yourself by wearing long sleeves, pants, a wide-brimmed hat, and sunglasses year round, whenever you are outdoors.  Once a month, do a whole-body skin exam, using a mirror to look at the skin on your back. Tell your health care provider about new moles, moles that have irregular borders, moles that are larger than a pencil eraser, or moles that have changed in shape or color.  Stay current with required vaccines (immunizations).  Influenza vaccine. All adults should be immunized every year.  Tetanus, diphtheria, and acellular pertussis (Td, Tdap) vaccine. An adult who has not previously received Tdap or who does not know his vaccine status should receive 1 dose of Tdap. This initial dose should be followed by tetanus and diphtheria toxoids (Td) booster doses every 10 years. Adults with an unknown or incomplete history of completing a 3-dose immunization series with Td-containing vaccines should begin or complete a primary immunization series including a Tdap dose. Adults should receive a Td booster every 10 years.  Varicella vaccine. An adult without evidence of immunity to varicella should receive 2 doses or a second dose if he has previously received 1 dose.  Human papillomavirus (HPV) vaccine. Males aged 68-21 years who have not received the vaccine previously should receive the 3-dose series. Males aged 22-26 years may be immunized. Immunization is recommended through the age of 6 years for any male who has sex with males and did not get any or all doses earlier. Immunization is recommended for any person with an immunocompromised condition through the age of 49 years if he did not get any or all doses earlier. During the 3-dose series, the second dose should be obtained 4-8 weeks after the first  dose. The third dose should be obtained 24 weeks after the first dose and 16 weeks after the second dose.  Zoster vaccine. One dose is recommended for adults aged 50 years or older unless certain conditions are present.  Measles, mumps, and rubella (MMR) vaccine. Adults born before [birth year] generally are considered immune to measles and mumps. Adults born in [birth year] or later should have 1 or more doses of MMR vaccine unless there is a contraindication to the vaccine or there is laboratory evidence of immunity to each of the three diseases. A routine second dose of MMR vaccine should be obtained at least 28 days after the first dose for students attending postsecondary  schools, health care workers, or international travelers. People who received inactivated measles vaccine or an unknown type of measles vaccine during 1963-1967 should receive 2 doses of MMR vaccine. People who received inactivated mumps vaccine or an unknown type of mumps vaccine before 1979 and are at high risk for mumps infection should consider immunization with 2 doses of MMR vaccine. Unvaccinated health care workers born before 1957 who lack laboratory evidence of measles, mumps, or rubella immunity or laboratory confirmation of disease should consider measles and mumps immunization with 2 doses of MMR vaccine or rubella immunization with 1 dose of MMR vaccine.  Pneumococcal 13-valent conjugate (PCV13) vaccine. When indicated, a person who is uncertain of his immunization history and has no record of immunization should receive the PCV13 vaccine. An adult aged 19 years or older who has certain medical conditions and has not been previously immunized should receive 1 dose of PCV13 vaccine. This PCV13 should be followed with a dose of pneumococcal polysaccharide (PPSV23) vaccine. The PPSV23 vaccine dose should be obtained at least 8 weeks after the dose of PCV13 vaccine. An adult aged 19 years or older who has certain medical conditions and  previously received 1 or more doses of PPSV23 vaccine should receive 1 dose of PCV13. The PCV13 vaccine dose should be obtained 1 or more years after the last PPSV23 vaccine dose.  Pneumococcal polysaccharide (PPSV23) vaccine. When PCV13 is also indicated, PCV13 should be obtained first. All adults aged 65 years and older should be immunized. An adult younger than age 65 years who has certain medical conditions should be immunized. Any person who resides in a nursing home or long-term care facility should be immunized. An adult smoker should be immunized. People with an immunocompromised condition and certain other conditions should receive both PCV13 and PPSV23 vaccines. People with human immunodeficiency virus (HIV) infection should be immunized as soon as possible after diagnosis. Immunization during chemotherapy or radiation therapy should be avoided. Routine use of PPSV23 vaccine is not recommended for American Indians, [place] Natives, or people younger than 65 years unless there are medical conditions that require PPSV23 vaccine. When indicated, people who have unknown immunization and have no record of immunization should receive PPSV23 vaccine. One-time revaccination 5 years after the first dose of PPSV23 is recommended for people aged 19-64 years who have chronic kidney failure, nephrotic syndrome, asplenia, or immunocompromised conditions. People who received 1-2 doses of PPSV23 before age 65 years should receive another dose of PPSV23 vaccine at age 65 years or later if at least 5 years have passed since the previous dose. Doses of PPSV23 are not needed for people immunized with PPSV23 at or after age 65 years.  Meningococcal vaccine. Adults with asplenia or persistent complement component deficiencies should receive 2 doses of quadrivalent meningococcal conjugate (MenACWY-D) vaccine. The doses should be obtained at least 2 months apart. Microbiologists working with certain meningococcal bacteria,  military recruits, people at risk during an outbreak, and people who travel to or live in countries with a high rate of meningitis should be immunized. A first-year college student up through age 21 years who is living in a residence hall should receive a dose if he did not receive a dose on or after his 16th birthday. Adults who have certain high-risk conditions should receive one or more doses of vaccine.  Hepatitis A vaccine. Adults who wish to be protected from this disease, have certain high-risk conditions, work with hepatitis A-infected animals, work in hepatitis A research labs, or   travel to or work in countries with a high rate of hepatitis A should be immunized. Adults who were previously unvaccinated and who anticipate close contact with an international adoptee during the first 60 days after arrival in the [place] from a country with a high rate of hepatitis A should be immunized.  Hepatitis B vaccine. Adults should be immunized if they wish to be protected from this disease, have certain high-risk conditions, may be exposed to blood or other infectious body fluids, are household contacts or sex partners of hepatitis B positive people, are clients or workers in certain care facilities, or travel to or work in countries with a high rate of hepatitis B.  Haemophilus influenzae type b (Hib) vaccine. A previously unvaccinated person with asplenia or sickle cell disease or having a scheduled splenectomy should receive 1 dose of Hib vaccine. Regardless of previous immunization, a recipient of a hematopoietic stem cell transplant should receive a 3-dose series 6-12 months after his successful transplant. Hib vaccine is not recommended for adults with HIV infection. Preventive Service / Frequency Ages 52 to 17  Blood pressure check.** / Every 1 to 2 years.  Lipid and cholesterol check.** / Every 5 years beginning at age 69.  Hepatitis C blood test.** / For any individual with known risks for  hepatitis C.  Skin self-exam. / Monthly.  Influenza vaccine. / Every year.  Tetanus, diphtheria, and acellular pertussis (Tdap, Td) vaccine.** / Consult your health care provider. 1 dose of Td every 10 years.  Varicella vaccine.** / Consult your health care provider.  HPV vaccine. / 3 doses over 6 months, if 72 or younger.  Measles, mumps, rubella (MMR) vaccine.** / You need at least 1 dose of MMR if you were born in 1957 or later. You may also need a second dose.  Pneumococcal 13-valent conjugate (PCV13) vaccine.** / Consult your health care provider.  Pneumococcal polysaccharide (PPSV23) vaccine.** / 1 to 2 doses if you smoke cigarettes or if you have certain conditions.  Meningococcal vaccine.** / 1 dose if you are age 35 to 60 years and a Market researcher living in a residence hall, or have one of several medical conditions. You may also need additional booster doses.  Hepatitis A vaccine.** / Consult your health care provider.  Hepatitis B vaccine.** / Consult your health care provider.  Haemophilus influenzae type b (Hib) vaccine.** / Consult your health care provider. Ages 35 to 8  Blood pressure check.** / Every 1 to 2 years.  Lipid and cholesterol check.** / Every 5 years beginning at age 57.  Lung cancer screening. / Every year if you are aged 44-80 years and have a 30-pack-year history of smoking and currently smoke or have quit within the past 15 years. Yearly screening is stopped once you have quit smoking for at least 15 years or develop a health problem that would prevent you from having lung cancer treatment.  Fecal occult blood test (FOBT) of stool. / Every year beginning at age 55 and continuing until age 73. You may not have to do this test if you get a colonoscopy every 10 years.  Flexible sigmoidoscopy** or colonoscopy.** / Every 5 years for a flexible sigmoidoscopy or every 10 years for a colonoscopy beginning at age 28 and continuing until age  1.  Hepatitis C blood test.** / For all people born from [birth year] through 1965 and any individual with known risks for hepatitis C.  Skin self-exam. / Monthly.  Influenza vaccine. / Every  year.  Tetanus, diphtheria, and acellular pertussis (Tdap/Td) vaccine.** / Consult your health care provider. 1 dose of Td every 10 years.  Varicella vaccine.** / Consult your health care provider.  Zoster vaccine.** / 1 dose for adults aged 53 years or older.  Measles, mumps, rubella (MMR) vaccine.** / You need at least 1 dose of MMR if you were born in 1957 or later. You may also need a second dose.  Pneumococcal 13-valent conjugate (PCV13) vaccine.** / Consult your health care provider.  Pneumococcal polysaccharide (PPSV23) vaccine.** / 1 to 2 doses if you smoke cigarettes or if you have certain conditions.  Meningococcal vaccine.** / Consult your health care provider.  Hepatitis A vaccine.** / Consult your health care provider.  Hepatitis B vaccine.** / Consult your health care provider.  Haemophilus influenzae type b (Hib) vaccine.** / Consult your health care provider. Ages 77 and over  Blood pressure check.** / Every 1 to 2 years.  Lipid and cholesterol check.**/ Every 5 years beginning at age 85.  Lung cancer screening. / Every year if you are aged 55-80 years and have a 30-pack-year history of smoking and currently smoke or have quit within the past 15 years. Yearly screening is stopped once you have quit smoking for at least 15 years or develop a health problem that would prevent you from having lung cancer treatment.  Fecal occult blood test (FOBT) of stool. / Every year beginning at age 33 and continuing until age 11. You may not have to do this test if you get a colonoscopy every 10 years.  Flexible sigmoidoscopy** or colonoscopy.** / Every 5 years for a flexible sigmoidoscopy or every 10 years for a colonoscopy beginning at age 28 and continuing until age 73.  Hepatitis C blood  test.** / For all people born from [birth year] through 1965 and any individual with known risks for hepatitis C.  Abdominal aortic aneurysm (AAA) screening.** / A one-time screening for ages 50 to 27 years who are current or former smokers.  Skin self-exam. / Monthly.  Influenza vaccine. / Every year.  Tetanus, diphtheria, and acellular pertussis (Tdap/Td) vaccine.** / 1 dose of Td every 10 years.  Varicella vaccine.** / Consult your health care provider.  Zoster vaccine.** / 1 dose for adults aged 34 years or older.  Pneumococcal 13-valent conjugate (PCV13) vaccine.** / Consult your health care provider.  Pneumococcal polysaccharide (PPSV23) vaccine.** / 1 dose for all adults aged 63 years and older.  Meningococcal vaccine.** / Consult your health care provider.  Hepatitis A vaccine.** / Consult your health care provider.  Hepatitis B vaccine.** / Consult your health care provider.  Haemophilus influenzae type b (Hib) vaccine.** / Consult your health care provider. **Family history and personal history of risk and conditions may change your health care provider's recommendations. Document Released: 08/31/2001 Document Revised: 07/10/2013 Document Reviewed: 11/30/2010 [facility] Patient Information 2015 [place], [place]. This information is not intended to replace advice given to you by your health care provider. Make sure you discuss any questions you have with your health care provider.

## 2015-01-10 NOTE — Progress Notes (Signed)
Pre visit review using our clinic review tool, if applicable. No additional management support is needed unless otherwise documented below in the visit note. 

## 2015-01-10 NOTE — Progress Notes (Signed)
[name]  [id number] 24-Jun-1961 01/10/2015      Progress Note-Follow Up  Subjective  Chief Complaint  Chief Complaint  Patient presents with  . Annual Exam    HPI  Patient is a 54 y.o. male in today for routine medical care.  Patient is in today for annual exam. He has a compensated past medical history and is followed with numerous medical providers including Dr. Gastroenterology for his history of colon cancer, [facility] for his history of psoriasis. He needs refills on several medications including his Robaxin, Ambien and Valium. No recent fevers or chills but he does have intermittent back pain and abdominal pain. Denies CP/palp/SOB/HA/congestion/fevers/GI or GU c/o. Taking meds as prescribed Past Medical History  Diagnosis Date  . Obesity   . Iron deficiency anemia 2011  . Psoriasis   . Hepatic artery stenosis   . Colon cancer     right colon cancer- adenocarcinoma CEA level isnrmal at 1.8  . Diabetes mellitus   . Hypertension   . Hyperlipidemia   . Hypertriglyceridemia 12/14/2010  . Preventative health care 10/08/2012    Past Surgical History  Procedure Laterality Date  . Cholecystectomy  1994  . Hemicolectomy      12/17/2009 right  . Colonoscopy    . Polypectomy    . Carpal tunnel release      RIGHT    Family History  Problem Relation Age of Onset  . Stomach cancer Mother     diedinher 47's  . Diabetes type II Brother     boderline  . Diabetes Brother     type II  . Other Neg Hx     cad,prostate ca, colon ca  . Coronary artery disease Neg Hx   . Cancer Neg Hx     colon, prostate  . Alcohol abuse Brother     History   Social History  . Marital Status: Single    Spouse Name: [name]  . Number of Children: 0  . Years of Education: N/A   Occupational History  . Chartered certified accountant and fax   Social History Main Topics  . Smoking status: Current Some Day Smoker    Types: Cigarettes  . Smokeless tobacco: Never Used  . Alcohol Use:  No  . Drug Use: No  . Sexual Activity: Yes   Other Topics Concern  . Not on file   Social History Narrative   Occupation: Theatre stage manager and fax)   Single  (lives with [name])   no children    former smoker   Illicit Drug Use - no     Current Outpatient Prescriptions on File Prior to Visit  Medication Sig Dispense Refill  . atorvastatin (LIPITOR) 10 MG tablet Take 1 tablet (10 mg total) by mouth daily. 90 tablet 3  . Blood Glucose Monitoring Suppl (ONETOUCH VERIO) W/DEVICE KIT   0  . Clobetasol Prop Emollient Base 0.05 % emollient cream Apply topically daily as needed.      Marland Kitchen CLOBEX SPRAY 0.05 % external spray     . glucose blood (ONETOUCH VERIO) test strip Test three times daily, DX E11.9 300 each 6  . HUMIRA PEN 40 MG/0.8ML PNKT     . Insulin Glargine (LANTUS SOLOSTAR) 100 UNIT/ML Solostar Pen Inject 60 Units into the skin daily at 10 pm. 15 mL 5  . insulin glargine (LANTUS) 100 UNIT/ML injection Inject 0.6 mLs (60 Units total) into the skin at bedtime. 1 pen 6  . methocarbamol (  ROBAXIN) 500 MG tablet Take 1 tablet (500 mg total) by mouth every 8 (eight) hours as needed for muscle spasms. 60 tablet 1  . NOVOLOG FLEXPEN 100 UNIT/ML FlexPen 10 units SQ tid with meals, check BS with meals if BS>200 take 12 units, if >300 take 14 units, if over 450 call md 15 mL 5  . [place] LANCETS MISC     . WELCHOL 625 MG tablet Take 3 tablets (1,875 mg total) by mouth 2 (two) times daily with a meal. 180 tablet 5  . zolpidem (AMBIEN) 10 MG tablet   0  . diazepam (VALIUM) 5 MG tablet Take 1 tablet (5 mg total) by mouth every 12 (twelve) hours as needed for anxiety. (Patient not taking: Reported on 01/10/2015) 60 tablet 0  . [DISCONTINUED] traZODone (DESYREL) 50 MG tablet Take 1 tablet (50 mg total) by mouth at bedtime as needed. 30 tablet 5   No current facility-administered medications on file prior to visit.    No Known Allergies  Review of Systems  Review of Systems    Constitutional: Positive for malaise/fatigue. Negative for fever and chills.  HENT: Negative for congestion, hearing loss and nosebleeds.   Eyes: Negative for discharge.  Respiratory: Negative for cough, sputum production, shortness of breath and wheezing.   Cardiovascular: Negative for chest pain, palpitations and leg swelling.  Gastrointestinal: Negative for heartburn, nausea, vomiting, diarrhea, constipation and blood in stool.  Genitourinary: Negative for dysuria, urgency, frequency and hematuria.  Musculoskeletal: Negative for myalgias, back pain and falls.  Skin: Positive for itching and rash.  Neurological: Negative for dizziness, tremors, sensory change, focal weakness, loss of consciousness, weakness and headaches.  Endo/Heme/Allergies: Negative for polydipsia. Does not bruise/bleed easily.  Psychiatric/Behavioral: Negative for depression and suicidal ideas. The patient is not nervous/anxious and does not have insomnia.     Objective  BP 122/64 mmHg  Pulse 107  Temp(Src) 98 F (36.7 C) (Oral)  Ht '5\' 8"'  (1.727 m)  Wt 243 lb 2 oz (110.281 kg)  BMI 36.98 kg/m2  SpO2 58%  Physical Exam  Physical Exam  Constitutional: He is oriented to person, place, and time and well-developed, well-nourished, and in no distress. No distress.  HENT:  Head: Normocephalic and atraumatic.  Eyes: Conjunctivae are normal.  Neck: Neck supple. No thyromegaly present.  Cardiovascular: Normal rate, regular rhythm and normal heart sounds.   No murmur heard. Pulmonary/Chest: Effort normal and breath sounds normal. No respiratory distress.  Abdominal: He exhibits no distension and no mass. There is no tenderness.  Musculoskeletal: He exhibits no edema.  Neurological: He is alert and oriented to person, place, and time.  Skin: Skin is warm.  Psychiatric: Memory, affect and judgment normal.    Lab Results  Component Value Date   TSH 3.72 11/05/2014   Lab Results  Component Value Date   WBC  8.6 11/05/2014   HGB 13.9 11/05/2014   HCT 40.4 11/05/2014   MCV 87.0 11/05/2014   PLT 301.0 11/05/2014   Lab Results  Component Value Date   CREATININE 0.77 11/05/2014   BUN 17 11/05/2014   NA 135 11/05/2014   K 4.1 11/05/2014   CL 104 11/05/2014   CO2 24 11/05/2014   Lab Results  Component Value Date   ALT 38 11/05/2014   AST 29 11/05/2014   ALKPHOS 74 11/05/2014   BILITOT 0.6 11/05/2014   Lab Results  Component Value Date   CHOL 115 10/11/2013   Lab Results  Component Value Date  HDL 20* 10/30/2014   Lab Results  Component Value Date   LDLCALC 42 10/11/2013   Lab Results  Component Value Date   TRIG 194* 10/11/2013   Lab Results  Component Value Date   CHOLHDL 3.4 10/11/2013     Assessment & Plan  Essential hypertension Well controlled, no changes to meds. Encouraged heart healthy diet such as the DASH diet and exercise as tolerated.   Psoriasis He feels it was cleared while in chiropractic care has slightly worsened recently. Follows with Derm at [facility]  Hyperlipidemia, mixed Tolerating statin, encouraged heart healthy diet, avoid trans fats, minimize simple carbs and saturated fats. Increase exercise as tolerated  Diabetes mellitus type 2 in obese hgba1c acceptable, minimize simple carbs. Increase exercise as tolerated. Continue current meds  Obesity Encouraged DASH diet, decrease po intake and increase exercise as tolerated. Needs 7-8 hours of sleep nightly. Avoid trans fats, eat small, frequent meals every 4-5 hours with lean proteins, complex carbs and healthy fats. Minimize simple carbs  FATTY LIVER DISEASE conirmed by CT scan of abdomen in April 2016 encouraged to minimize simple carbs and fatty foods.  GERD Avoid offending foods, start probiotics. Do not eat large meals in late evening and consider raising head of bed.   Malignant neoplasm of colon Follows closely with gastroenterology, Dr [name]  Preventative health care Patient  encouraged to maintain heart healthy diet, regular exercise, adequate sleep. Consider daily probiotics. Take medications as prescribed. Given and reviewed copy of ACP documents from [place] and encouraged to complete and return  INSOMNIA, CHRONIC Encouraged good sleep hygiene such as dark, quiet room. No blue/green glowing lights such as computer screens in bedroom. No alcohol or stimulants in evening. Cut down on caffeine as able. Regular exercise is helpful but not just prior to bed time. Ambien prn  Muscle spasm of back Encouraged moist heat and gentle stretching as tolerated. May try NSAIDs and prescription meds as directed and report if symptoms worsen or seek immediate care. Robaxin prn given refill  Anxiety Uses Valium infrequently may continue

## 2015-01-15 ENCOUNTER — Telehealth: Payer: Self-pay | Admitting: Family Medicine

## 2015-01-15 NOTE — Telephone Encounter (Signed)
Caller name: manju from [facility] Relation to pt: Call back number: [phone] Pharmacy:  Reason for call:   Requesting generic version of robaxin for patient. This would be cheaper for patient. GLO#[id number] Manju is Wanting this to be done by this afternoon or patient will have to pay full price

## 2015-01-15 NOTE — Telephone Encounter (Signed)
Gave ok per recent Rx to use generic.  Name brand only checked.

## 2015-01-24 ENCOUNTER — Other Ambulatory Visit: Payer: Self-pay | Admitting: Family Medicine

## 2015-01-26 ENCOUNTER — Encounter: Payer: Self-pay | Admitting: Family Medicine

## 2015-01-26 NOTE — Assessment & Plan Note (Signed)
Encouraged moist heat and gentle stretching as tolerated. May try NSAIDs and prescription meds as directed and report if symptoms worsen or seek immediate care. Robaxin prn given refill

## 2015-01-26 NOTE — Assessment & Plan Note (Signed)
Encouraged DASH diet, decrease po intake and increase exercise as tolerated. Needs 7-8 hours of sleep nightly. Avoid trans fats, eat small, frequent meals every 4-5 hours with lean proteins, complex carbs and healthy fats. Minimize simple carbs 

## 2015-01-26 NOTE — Assessment & Plan Note (Signed)
Well controlled, no changes to meds. Encouraged heart healthy diet such as the DASH diet and exercise as tolerated.  °

## 2015-01-26 NOTE — Assessment & Plan Note (Signed)
Encouraged good sleep hygiene such as dark, quiet room. No blue/green glowing lights such as computer screens in bedroom. No alcohol or stimulants in evening. Cut down on caffeine as able. Regular exercise is helpful but not just prior to bed time.  Ambien prn 

## 2015-01-26 NOTE — Assessment & Plan Note (Signed)
Uses Valium infrequently may continue

## 2015-01-26 NOTE — Assessment & Plan Note (Signed)
conirmed by CT scan of abdomen in April 2016 encouraged to minimize simple carbs and fatty foods.

## 2015-01-26 NOTE — Assessment & Plan Note (Signed)
Patient encouraged to maintain heart healthy diet, regular exercise, adequate sleep. Consider daily probiotics. Take medications as prescribed. Given and reviewed copy of ACP documents from [place] Secretary of State and encouraged to complete and return 

## 2015-01-26 NOTE — Assessment & Plan Note (Signed)
Avoid offending foods, start probiotics. Do not eat large meals in late evening and consider raising head of bed.  

## 2015-01-26 NOTE — Assessment & Plan Note (Signed)
Tolerating statin, encouraged heart healthy diet, avoid trans fats, minimize simple carbs and saturated fats. Increase exercise as tolerated 

## 2015-01-26 NOTE — Assessment & Plan Note (Signed)
Follows closely with gastroenterology, Dr [name]

## 2015-01-26 NOTE — Assessment & Plan Note (Signed)
hgba1c acceptable, minimize simple carbs. Increase exercise as tolerated. Continue current meds 

## 2015-01-26 NOTE — Assessment & Plan Note (Addendum)
He feels it was cleared while in chiropractic care has slightly worsened recently. Follows with Derm at [facility]

## 2015-02-20 ENCOUNTER — Telehealth: Payer: Self-pay | Admitting: Family Medicine

## 2015-02-20 NOTE — Telephone Encounter (Signed)
Refill done as instructed 

## 2015-02-20 NOTE — Telephone Encounter (Signed)
Spoke to the Pharmacist Cecille Rubin at [facility].  Dr. Charlett Blake did speak to Dr. Etter Sjogren who approved the prescription to be put in her name in order for the prescription to be completed/filled. PCP did forward problem with the DEA to Martinique Johnson office manager to take care of asap.

## 2015-02-20 NOTE — Telephone Encounter (Signed)
Refill x1 

## 2015-02-20 NOTE — Telephone Encounter (Signed)
Caller name: Dory Larsen with [facility] Can be reached: [phone]  Reason for call: She is trying to refill pts Ambien but Dr. Frederik Pear DEA# is pulling as expired and cannot process the RX.

## 2015-02-20 NOTE — Telephone Encounter (Signed)
Ambien

## 2015-03-04 ENCOUNTER — Other Ambulatory Visit: Payer: Self-pay | Admitting: Family Medicine

## 2015-03-04 ENCOUNTER — Encounter: Payer: Self-pay | Admitting: Family Medicine

## 2015-03-06 ENCOUNTER — Other Ambulatory Visit: Payer: Self-pay | Admitting: Family Medicine

## 2015-03-06 MED ORDER — INSULIN GLARGINE 100 UNIT/ML SOLOSTAR PEN: PEN_INJECTOR | SUBCUTANEOUS | Status: DC

## 2015-03-29 ENCOUNTER — Other Ambulatory Visit: Payer: Self-pay | Admitting: Family Medicine

## 2015-04-04 ENCOUNTER — Other Ambulatory Visit: Payer: BLUE CROSS/BLUE SHIELD

## 2015-04-11 ENCOUNTER — Ambulatory Visit: Payer: BLUE CROSS/BLUE SHIELD | Admitting: Family Medicine

## 2015-04-28 ENCOUNTER — Other Ambulatory Visit: Payer: BLUE CROSS/BLUE SHIELD

## 2015-04-29 ENCOUNTER — Other Ambulatory Visit (INDEPENDENT_AMBULATORY_CARE_PROVIDER_SITE_OTHER): Payer: BLUE CROSS/BLUE SHIELD

## 2015-04-29 DIAGNOSIS — K859 Acute pancreatitis without necrosis or infection, unspecified: Secondary | ICD-10-CM

## 2015-04-29 DIAGNOSIS — R002 Palpitations: Secondary | ICD-10-CM

## 2015-04-29 DIAGNOSIS — M62830 Muscle spasm of back: Secondary | ICD-10-CM

## 2015-04-29 DIAGNOSIS — I10 Essential (primary) hypertension: Secondary | ICD-10-CM

## 2015-04-29 DIAGNOSIS — E782 Mixed hyperlipidemia: Secondary | ICD-10-CM | POA: Diagnosis not present

## 2015-04-29 DIAGNOSIS — E119 Type 2 diabetes mellitus without complications: Secondary | ICD-10-CM | POA: Diagnosis not present

## 2015-04-29 DIAGNOSIS — E1169 Type 2 diabetes mellitus with other specified complication: Secondary | ICD-10-CM

## 2015-04-29 DIAGNOSIS — E669 Obesity, unspecified: Secondary | ICD-10-CM

## 2015-04-29 DIAGNOSIS — F419 Anxiety disorder, unspecified: Secondary | ICD-10-CM

## 2015-04-29 LAB — COMPREHENSIVE METABOLIC PANEL
ALK PHOS: 52 U/L (ref 39–117)
ALT: 46 U/L (ref 0–53)
AST: 30 U/L (ref 0–37)
Albumin: 3.8 g/dL (ref 3.5–5.2)
BILIRUBIN TOTAL: 0.6 mg/dL (ref 0.2–1.2)
BUN: 13 mg/dL (ref 6–23)
CO2: 23 meq/L (ref 19–32)
CREATININE: 0.83 mg/dL (ref 0.40–1.50)
Calcium: 9.1 mg/dL (ref 8.4–10.5)
Chloride: 106 mEq/L (ref 96–112)
GFR: 103.21 mL/min (ref >60.00)
GLUCOSE: 102 mg/dL — AB (ref 70–99)
Potassium: 3.9 mEq/L (ref 3.5–5.1)
SODIUM: 137 meq/L (ref 135–145)
TOTAL PROTEIN: 7 g/dL (ref 6.0–8.3)

## 2015-04-29 LAB — CBC
HEMATOCRIT: 40.7 % (ref 39.0–52.0)
Hemoglobin: 14.1 g/dL (ref 13.0–17.0)
MCHC: 34.5 g/dL (ref 30.0–36.0)
MCV: 89.8 fl (ref 78.0–100.0)
Platelets: 295 10*3/uL (ref 150.0–400.0)
RBC: 4.54 Mil/uL (ref 4.22–5.81)
RDW: 12.1 % (ref 11.5–15.5)
WBC: 6.9 10*3/uL (ref 4.0–10.5)

## 2015-04-29 LAB — HEMOGLOBIN A1C: Hgb A1c MFr Bld: 7 % — ABNORMAL HIGH (ref 4.6–6.5)

## 2015-04-29 LAB — TSH: TSH: 2.39 u[IU]/mL (ref 0.35–4.50)

## 2015-04-29 LAB — LIPID PANEL
Cholesterol: 168 mg/dL (ref 0–200)
HDL: 30.4 mg/dL — ABNORMAL LOW (ref >39.00)
Total CHOL/HDL Ratio: 6
Triglycerides: 426 mg/dL — ABNORMAL HIGH (ref 0.0–149.0)

## 2015-04-29 LAB — LDL CHOLESTEROL, DIRECT: LDL DIRECT: 39 mg/dL

## 2015-05-01 ENCOUNTER — Encounter: Payer: Self-pay | Admitting: Family Medicine

## 2015-05-01 ENCOUNTER — Ambulatory Visit (INDEPENDENT_AMBULATORY_CARE_PROVIDER_SITE_OTHER): Payer: BLUE CROSS/BLUE SHIELD | Admitting: Family Medicine

## 2015-05-01 VITALS — BP 128/78 | HR 86 | Temp 98.0°F | Ht 68.0 in | Wt 250.0 lb

## 2015-05-01 DIAGNOSIS — Z23 Encounter for immunization: Secondary | ICD-10-CM

## 2015-05-01 DIAGNOSIS — E781 Pure hyperglyceridemia: Secondary | ICD-10-CM | POA: Diagnosis not present

## 2015-05-01 DIAGNOSIS — E782 Mixed hyperlipidemia: Secondary | ICD-10-CM

## 2015-05-01 DIAGNOSIS — E119 Type 2 diabetes mellitus without complications: Secondary | ICD-10-CM | POA: Diagnosis not present

## 2015-05-01 DIAGNOSIS — I10 Essential (primary) hypertension: Secondary | ICD-10-CM

## 2015-05-01 DIAGNOSIS — R002 Palpitations: Secondary | ICD-10-CM

## 2015-05-01 DIAGNOSIS — E669 Obesity, unspecified: Secondary | ICD-10-CM

## 2015-05-01 DIAGNOSIS — K219 Gastro-esophageal reflux disease without esophagitis: Secondary | ICD-10-CM

## 2015-05-01 DIAGNOSIS — E1169 Type 2 diabetes mellitus with other specified complication: Secondary | ICD-10-CM

## 2015-05-01 MED ORDER — ESCITALOPRAM OXALATE 10 MG PO TABS: 10.0000 mg | ORAL_TABLET | Freq: Every day | ORAL | Status: DC

## 2015-05-01 MED ORDER — LOSARTAN POTASSIUM 50 MG PO TABS: 50.0000 mg | ORAL_TABLET | Freq: Every day | ORAL | Status: DC

## 2015-05-01 NOTE — Patient Instructions (Signed)

## 2015-05-01 NOTE — Progress Notes (Signed)
Pre visit review using our clinic review tool, if applicable. No additional management support is needed unless otherwise documented below in the visit note. 

## 2015-05-11 ENCOUNTER — Encounter: Payer: Self-pay | Admitting: Family Medicine

## 2015-05-11 NOTE — Assessment & Plan Note (Signed)
Well controlled, no changes to meds. Encouraged heart healthy diet such as the DASH diet and exercise as tolerated.  °

## 2015-05-11 NOTE — Progress Notes (Signed)
Subjective:    Patient ID: [name], male    DOB: [birth year], 54 y.o.   MRN: [id number]  Chief Complaint  Patient presents with  . Follow-up    3 month    HPI Patient is in today for follow-up. Feeling fairly well. No recent illness. Denies polyuria or polydipsia. Has been maintaining a heart healthy diet and has decreased carbohydrates and fatty food intake. Has increased exercise. No recent illness. Denies CP/palp/SOB/HA/congestion/fevers/GI or GU c/o. Taking meds as prescribed  Past Medical History  Diagnosis Date  . Obesity   . Iron deficiency anemia 2011  . Psoriasis   . Hepatic artery stenosis ([place])   . Colon cancer ([place])     right colon cancer- adenocarcinoma CEA level isnrmal at 1.8  . Diabetes mellitus   . Hypertension   . Hyperlipidemia   . Hypertriglyceridemia 12/14/2010  . Preventative health care 10/08/2012    Past Surgical History  Procedure Laterality Date  . Cholecystectomy  1994  . Hemicolectomy      12/17/2009 right  . Colonoscopy    . Polypectomy    . Carpal tunnel release      RIGHT    Family History  Problem Relation Age of Onset  . Stomach cancer Mother     diedinher 36's  . Diabetes type II Brother     boderline  . Diabetes Brother     type II  . Other Neg Hx     cad,prostate ca, colon ca  . Coronary artery disease Neg Hx   . Cancer Neg Hx     colon, prostate  . Alcohol abuse Brother     Social History   Social History  . Marital Status: Single    Spouse Name: [name]  . Number of Children: 0  . Years of Education: N/A   Occupational History  . Chartered certified accountant and fax   Social History Main Topics  . Smoking status: Current Some Day Smoker    Types: Cigarettes  . Smokeless tobacco: Never Used  . Alcohol Use: No  . Drug Use: No  . Sexual Activity: Yes     Comment: lives with girlfriend, no dietary restrictions   Other Topics Concern  . Not on file   Social History Narrative   Occupation: Theatre stage manager  and fax)   Single  (lives with [name])   no children    former smoker   Illicit Drug Use - no     Outpatient Prescriptions Prior to Visit  Medication Sig Dispense Refill  . atorvastatin (LIPITOR) 10 MG tablet Take 1 tablet (10 mg total) by mouth daily. 90 tablet 3  . Blood Glucose Monitoring Suppl (ONETOUCH VERIO) W/DEVICE KIT   0  . Clobetasol Prop Emollient Base 0.05 % emollient cream Apply topically daily as needed.      Marland Kitchen CLOBEX SPRAY 0.05 % external spray     . diazepam (VALIUM) 5 MG tablet Take 1 tablet (5 mg total) by mouth every 12 (twelve) hours as needed for anxiety. 60 tablet 5  . fenofibrate 160 MG tablet Take 1 tablet (160 mg total) by mouth daily. 90 tablet 2  . glimepiride (AMARYL) 1 MG tablet TAKE 1 TABLET BY MOUTH EVERY DAY BEFORE BREAKFAST 90 tablet 2  . glucose blood (ONETOUCH VERIO) test strip Test three times daily, DX E11.9 300 each 6  . HUMIRA PEN 40 MG/0.8ML PNKT     . Insulin Glargine (LANTUS SOLOSTAR) 100 UNIT/ML Solostar  Pen INJECT 60 UNITS SUBCUTANEOUS EVERY NIGHT AT 10PM **PER INSURANSE 3 BOXES FOR 90 DAYS** 45 pen 3  . Insulin Pen Needle (B-D ULTRAFINE III SHORT PEN) 31G X 8 MM MISC Test as directed three times daily.  DX E11.9 300 each 3  . metFORMIN (GLUCOPHAGE) 1000 MG tablet Take 1 tablet (1,000 mg total) by mouth 2 (two) times daily with a meal. 180 tablet 2  . NOVOLOG FLEXPEN 100 UNIT/ML FlexPen INJECT 10-16 UNITS UNDER SKIN 3 TIMES A DAY BEFORE MEALS 5 pen 5  . omeprazole (PRILOSEC) 40 MG capsule TAKE ONE CAPSULE BY MOUTH TWICE A DAY 60 capsule 5  . [place] LANCETS MISC     . ROBAXIN 500 MG tablet Take 1 tablet (500 mg total) by mouth every 8 (eight) hours as needed for muscle spasms. 180 tablet 1  . WELCHOL 625 MG tablet Take 3 tablets (1,875 mg total) by mouth 2 (two) times daily with a meal. 180 tablet 5  . zolpidem (AMBIEN) 10 MG tablet Take 1 tablet (10 mg total) by mouth at bedtime as needed for sleep. 90 tablet 1  . lisinopril  (PRINIVIL,ZESTRIL) 5 MG tablet Take 1 tablet (5 mg total) by mouth daily. 90 tablet 2  . losartan (COZAAR) 25 MG tablet Take 1 tablet (25 mg total) by mouth daily. 90 tablet 2  . metoprolol tartrate (LOPRESSOR) 25 MG tablet TAKE 1 TABLET BY MOUTH TWICE A DAY 60 tablet 2   No facility-administered medications prior to visit.    No Known Allergies  Review of Systems  Constitutional: Negative for fever, chills and malaise/fatigue.  HENT: Negative for congestion and hearing loss.   Eyes: Negative for discharge.  Respiratory: Negative for cough, sputum production and shortness of breath.   Cardiovascular: Negative for chest pain, palpitations and leg swelling.  Gastrointestinal: Negative for heartburn, nausea, vomiting, abdominal pain, diarrhea, constipation and blood in stool.  Genitourinary: Negative for dysuria, urgency, frequency and hematuria.  Musculoskeletal: Negative for myalgias, back pain and falls.  Skin: Negative for rash.  Neurological: Negative for dizziness, sensory change, loss of consciousness, weakness and headaches.  Endo/Heme/Allergies: Negative for environmental allergies. Does not bruise/bleed easily.  Psychiatric/Behavioral: Negative for depression and suicidal ideas. The patient is not nervous/anxious and does not have insomnia.        Objective:    Physical Exam  Constitutional: He is oriented to person, place, and time. He appears well-developed and well-nourished. No distress.  HENT:  Head: Normocephalic and atraumatic.  Nose: Nose normal.  Eyes: Right eye exhibits no discharge. Left eye exhibits no discharge.  Neck: Normal range of motion. Neck supple.  Cardiovascular: Normal rate and regular rhythm.   No murmur heard. Pulmonary/Chest: Effort normal and breath sounds normal.  Abdominal: Soft. Bowel sounds are normal. There is no tenderness.  Musculoskeletal: He exhibits no edema.  Neurological: He is alert and oriented to person, place, and time.  Skin:  Skin is warm and dry.  Psychiatric: He has a normal mood and affect.  Nursing note and vitals reviewed.   BP 128/78 mmHg  Pulse 86  Temp(Src) 98 F (36.7 C) (Oral)  Ht _0  (1.727 m)  Wt 250 lb (113.399 kg)  BMI 38.02 kg/m2  SpO2 96% Wt Readings from Last 3 Encounters:  05/01/15 250 lb (113.399 kg)  01/10/15 243 lb 2 oz (110.281 kg)  11/05/14 229 lb 6 oz (104.044 kg)     Lab Results  Component Value Date   WBC 6.9  04/29/2015   HGB 14.1 04/29/2015   HCT 40.7 04/29/2015   PLT 295.0 04/29/2015   GLUCOSE 102* 04/29/2015   CHOL 168 04/29/2015   TRIG * 04/29/2015    426.0 Triglyceride is over 400; calculations on Lipids are invalid.   HDL 30.40* 04/29/2015   LDLDIRECT 39.0 04/29/2015   LDLCALC SEE COMMENT 10/27/2014   ALT 46 04/29/2015   AST 30 04/29/2015   NA 137 04/29/2015   K 3.9 04/29/2015   CL 106 04/29/2015   CREATININE 0.83 04/29/2015   BUN 13 04/29/2015   CO2 23 04/29/2015   TSH 2.39 04/29/2015   PSA 0.12 01/08/2013   INR 0.9 10/27/2014   HGBA1C 7.0* 04/29/2015   MICROALBUR 0.50 01/08/2013    Lab Results  Component Value Date   TSH 2.39 04/29/2015   Lab Results  Component Value Date   WBC 6.9 04/29/2015   HGB 14.1 04/29/2015   HCT 40.7 04/29/2015   MCV 89.8 04/29/2015   PLT 295.0 04/29/2015   Lab Results  Component Value Date   NA 137 04/29/2015   K 3.9 04/29/2015   CO2 23 04/29/2015   GLUCOSE 102* 04/29/2015   BUN 13 04/29/2015   CREATININE 0.83 04/29/2015   BILITOT 0.6 04/29/2015   ALKPHOS 52 04/29/2015   AST 30 04/29/2015   ALT 46 04/29/2015   PROT 7.0 04/29/2015   ALBUMIN 3.8 04/29/2015   CALCIUM 9.1 04/29/2015   ANIONGAP 4* 10/30/2014   GFR 103.21 04/29/2015   Lab Results  Component Value Date   CHOL 168 04/29/2015   Lab Results  Component Value Date   HDL 30.40* 04/29/2015   Lab Results  Component Value Date   LDLCALC SEE COMMENT 10/27/2014   Lab Results  Component Value Date   TRIG * 04/29/2015    426.0  Triglyceride is over 400; calculations on Lipids are invalid.   Lab Results  Component Value Date   CHOLHDL 6 04/29/2015   Lab Results  Component Value Date   HGBA1C 7.0* 04/29/2015       Assessment & Plan:   Problem List Items Addressed This Visit    Palpitations   Relevant Orders   CBC   TSH   Lipid panel   Comprehensive metabolic panel   Hemoglobin A1c   HIV antibody (with reflex)   Hypertriglyceridemia   Relevant Medications   losartan (COZAAR) 50 MG tablet   Other Relevant Orders   CBC   TSH   Lipid panel   Comprehensive metabolic panel   Hemoglobin A1c   HIV antibody (with reflex)   Hyperlipidemia, mixed    Tolerating statin, encouraged heart healthy diet, avoid trans fats, minimize simple carbs and saturated fats. Increase exercise as tolerated. Triglycerides improving but has not been taking the welchol routinely encouraged to do so.      Relevant Medications   losartan (COZAAR) 50 MG tablet   Other Relevant Orders   CBC   TSH   Lipid panel   Comprehensive metabolic panel   Hemoglobin A1c   HIV antibody (with reflex)   GERD    Avoid offending foods, start probiotics. Do not eat large meals in late evening and consider raising head of bed.       Essential hypertension    Well controlled, no changes to meds. Encouraged heart healthy diet such as the DASH diet and exercise as tolerated.       Relevant Medications   losartan (COZAAR) 50 MG tablet   Diabetes mellitus  type 2 in obese (HCC)    Good improvement. hgba1c acceptable, minimize simple carbs. Increase exercise as tolerated. Continue current meds      Relevant Medications   losartan (COZAAR) 50 MG tablet   Other Relevant Orders   CBC   TSH   Lipid panel   Comprehensive metabolic panel   Hemoglobin A1c   HIV antibody (with reflex)    Other Visit Diagnoses    Encounter for immunization    -  Primary    Need for Tdap vaccination        Relevant Orders    Tdap vaccine greater than or  equal to 7yo IM (Completed)       I have discontinued Mr. Elamin's lisinopril, losartan, and metoprolol tartrate. I am also having him start on losartan and escitalopram. Additionally, I am having him maintain his Clobetasol Prop Emollient Base, ONETOUCH [place] LANCETS, CLOBEX SPRAY, HUMIRA PEN, ONETOUCH VERIO, WELCHOL, glucose blood, atorvastatin, Insulin Pen Needle, fenofibrate, glimepiride, metFORMIN, diazepam, ROBAXIN, zolpidem, omeprazole, NOVOLOG FLEXPEN, and Insulin Glargine.  Meds ordered this encounter  Medications  . losartan (COZAAR) 50 MG tablet    Sig: Take 1 tablet (50 mg total) by mouth daily.    Dispense:  90 tablet    Refill:  1  . escitalopram (LEXAPRO) 10 MG tablet    Sig: Take 1 tablet (10 mg total) by mouth daily.    Dispense:  30 tablet    Refill:  3     Penni Homans, MD

## 2015-05-11 NOTE — Assessment & Plan Note (Signed)
Tolerating statin, encouraged heart healthy diet, avoid trans fats, minimize simple carbs and saturated fats. Increase exercise as tolerated. Triglycerides improving but has not been taking the welchol routinely encouraged to do so.

## 2015-05-11 NOTE — Assessment & Plan Note (Signed)
Avoid offending foods, start probiotics. Do not eat large meals in late evening and consider raising head of bed.  

## 2015-05-11 NOTE — Assessment & Plan Note (Signed)
Good improvement. hgba1c acceptable, minimize simple carbs. Increase exercise as tolerated. Continue current meds

## 2015-06-17 ENCOUNTER — Other Ambulatory Visit: Payer: Self-pay | Admitting: Family Medicine

## 2015-06-17 MED ORDER — ESCITALOPRAM OXALATE 10 MG PO TABS: 10.0000 mg | ORAL_TABLET | Freq: Every day | ORAL | Status: DC

## 2015-06-24 ENCOUNTER — Encounter: Payer: Self-pay | Admitting: Family Medicine

## 2015-06-24 ENCOUNTER — Ambulatory Visit (INDEPENDENT_AMBULATORY_CARE_PROVIDER_SITE_OTHER): Payer: BLUE CROSS/BLUE SHIELD | Admitting: Family Medicine

## 2015-06-24 VITALS — BP 122/82 | HR 92 | Temp 98.4°F | Ht 68.0 in | Wt 245.2 lb

## 2015-06-24 DIAGNOSIS — E1169 Type 2 diabetes mellitus with other specified complication: Secondary | ICD-10-CM

## 2015-06-24 DIAGNOSIS — I10 Essential (primary) hypertension: Secondary | ICD-10-CM

## 2015-06-24 DIAGNOSIS — E669 Obesity, unspecified: Secondary | ICD-10-CM

## 2015-06-24 DIAGNOSIS — E119 Type 2 diabetes mellitus without complications: Secondary | ICD-10-CM | POA: Diagnosis not present

## 2015-06-24 DIAGNOSIS — J209 Acute bronchitis, unspecified: Secondary | ICD-10-CM

## 2015-06-24 DIAGNOSIS — E782 Mixed hyperlipidemia: Secondary | ICD-10-CM

## 2015-06-24 MED ORDER — AZITHROMYCIN 250 MG PO TABS: ORAL_TABLET | ORAL | Status: DC

## 2015-06-24 MED ORDER — BENZONATATE 200 MG PO CAPS: 200.0000 mg | ORAL_CAPSULE | Freq: Two times a day (BID) | ORAL | Status: DC | PRN

## 2015-06-24 MED ORDER — HYDROCODONE-HOMATROPINE 5-1.5 MG/5ML PO SYRP: 5.0000 mL | ORAL_SOLUTION | Freq: Every evening | ORAL | Status: DC | PRN

## 2015-06-24 NOTE — Patient Instructions (Signed)

## 2015-06-28 DIAGNOSIS — J209 Acute bronchitis, unspecified: Secondary | ICD-10-CM | POA: Insufficient documentation

## 2015-06-28 NOTE — Assessment & Plan Note (Addendum)
No flare in numbers with acute illness, minimize simple carbs. Increase exercise as tolerated. Continue current meds

## 2015-06-28 NOTE — Assessment & Plan Note (Signed)
Well controlled, no changes to meds. Encouraged heart healthy diet such as the DASH diet and exercise as tolerated.  °

## 2015-06-28 NOTE — Assessment & Plan Note (Signed)
Tolerating statin, encouraged heart healthy diet, avoid trans fats, minimize simple carbs and saturated fats. Increase exercise as tolerated 

## 2015-06-28 NOTE — Progress Notes (Signed)
Subjective:    Patient ID: [name], male    DOB: Sep 14, 1960, 54 y.o.   MRN: [id number]  Chief Complaint  Patient presents with  . Cough    HPI Patient is in today for evaluation of cough and illness. He's been no for the week. Does note his family member had strep throat recently with a sore throat and headache. He has had head congestion, sore throat malaise and myalgias. No nausea or GI symptoms has used some over-the-counter medications with no significant relief. Denies CP/palp/SOB/fevers/GI or GU c/o. Taking meds as prescribed  Past Medical History  Diagnosis Date  . Obesity   . Iron deficiency anemia 2011  . Psoriasis   . Hepatic artery stenosis ([place])   . Colon cancer ([place])     right colon cancer- adenocarcinoma CEA level isnrmal at 1.8  . Diabetes mellitus   . Hypertension   . Hyperlipidemia   . Hypertriglyceridemia 12/14/2010  . Preventative health care 10/08/2012    Past Surgical History  Procedure Laterality Date  . Cholecystectomy  1994  . Hemicolectomy      12/17/2009 right  . Colonoscopy    . Polypectomy    . Carpal tunnel release      RIGHT    Family History  Problem Relation Age of Onset  . Stomach cancer Mother     diedinher 55's  . Diabetes type II Brother     boderline  . Diabetes Brother     type II  . Other Neg Hx     cad,prostate ca, colon ca  . Coronary artery disease Neg Hx   . Cancer Neg Hx     colon, prostate  . Alcohol abuse Brother     Social History   Social History  . Marital Status: Single    Spouse Name: [name]  . Number of Children: 0  . Years of Education: N/A   Occupational History  . Chartered certified accountant and fax   Social History Main Topics  . Smoking status: Current Some Day Smoker    Types: Cigarettes  . Smokeless tobacco: Never Used  . Alcohol Use: No  . Drug Use: No  . Sexual Activity: Yes     Comment: lives with girlfriend, no dietary restrictions   Other Topics Concern  . Not on file   Social  History Narrative   Occupation: Theatre stage manager and fax)   Single  (lives with [name])   no children    former smoker   Illicit Drug Use - no     Outpatient Prescriptions Prior to Visit  Medication Sig Dispense Refill  . atorvastatin (LIPITOR) 10 MG tablet Take 1 tablet (10 mg total) by mouth daily. 90 tablet 3  . Blood Glucose Monitoring Suppl (ONETOUCH VERIO) W/DEVICE KIT   0  . Clobetasol Prop Emollient Base 0.05 % emollient cream Apply topically daily as needed.      Marland Kitchen CLOBEX SPRAY 0.05 % external spray     . diazepam (VALIUM) 5 MG tablet Take 1 tablet (5 mg total) by mouth every 12 (twelve) hours as needed for anxiety. 60 tablet 5  . escitalopram (LEXAPRO) 10 MG tablet Take 1 tablet (10 mg total) by mouth daily. 90 tablet 1  . fenofibrate 160 MG tablet Take 1 tablet (160 mg total) by mouth daily. 90 tablet 2  . glimepiride (AMARYL) 1 MG tablet TAKE 1 TABLET BY MOUTH EVERY DAY BEFORE BREAKFAST 90 tablet 2  . glucose blood (  ONETOUCH VERIO) test strip Test three times daily, DX E11.9 300 each 6  . HUMIRA PEN 40 MG/0.8ML PNKT     . Insulin Glargine (LANTUS SOLOSTAR) 100 UNIT/ML Solostar Pen INJECT 60 UNITS SUBCUTANEOUS EVERY NIGHT AT 10PM **PER INSURANSE 3 BOXES FOR 90 DAYS** 45 pen 3  . Insulin Pen Needle (B-D ULTRAFINE III SHORT PEN) 31G X 8 MM MISC Test as directed three times daily.  DX E11.9 300 each 3  . losartan (COZAAR) 50 MG tablet Take 1 tablet (50 mg total) by mouth daily. 90 tablet 1  . metFORMIN (GLUCOPHAGE) 1000 MG tablet Take 1 tablet (1,000 mg total) by mouth 2 (two) times daily with a meal. 180 tablet 2  . NOVOLOG FLEXPEN 100 UNIT/ML FlexPen INJECT 10-16 UNITS UNDER SKIN 3 TIMES A DAY BEFORE MEALS 5 pen 5  . omeprazole (PRILOSEC) 40 MG capsule TAKE ONE CAPSULE BY MOUTH TWICE A DAY 60 capsule 5  . [place] LANCETS MISC     . ROBAXIN 500 MG tablet Take 1 tablet (500 mg total) by mouth every 8 (eight) hours as needed for muscle spasms. 180 tablet 1  . WELCHOL 625  MG tablet Take 3 tablets (1,875 mg total) by mouth 2 (two) times daily with a meal. 180 tablet 5  . zolpidem (AMBIEN) 10 MG tablet Take 1 tablet (10 mg total) by mouth at bedtime as needed for sleep. 90 tablet 1   No facility-administered medications prior to visit.    No Known Allergies  Review of Systems  Constitutional: Negative for fever and malaise/fatigue.  HENT: Positive for congestion.   Eyes: Negative for discharge.  Respiratory: Positive for cough. Negative for shortness of breath.   Cardiovascular: Negative for chest pain, palpitations and leg swelling.  Gastrointestinal: Negative for nausea and abdominal pain.  Genitourinary: Negative for dysuria.  Musculoskeletal: Negative for falls.  Skin: Negative for rash.  Neurological: Negative for loss of consciousness and headaches.  Endo/Heme/Allergies: Negative for environmental allergies.  Psychiatric/Behavioral: Negative for depression. The patient is not nervous/anxious.        Objective:    Physical Exam  Constitutional: He is oriented to person, place, and time. He appears well-developed and well-nourished. No distress.  HENT:  Head: Normocephalic and atraumatic.  Nose: Nose normal.  Nasal mucosa boggy and erythematous  Eyes: Right eye exhibits no discharge. Left eye exhibits no discharge.  Neck: Normal range of motion. Neck supple.  Cardiovascular: Normal rate and regular rhythm.   No murmur heard. Pulmonary/Chest: Effort normal and breath sounds normal.  Abdominal: Soft. Bowel sounds are normal. There is no tenderness.  Musculoskeletal: He exhibits no edema.  Neurological: He is alert and oriented to person, place, and time.  Skin: Skin is warm and dry.  Psychiatric: He has a normal mood and affect.  Nursing note and vitals reviewed.   BP 122/82 mmHg  Pulse 92  Temp(Src) 98.4 F (36.9 C) (Oral)  Ht '5\' 8"'  (1.727 m)  Wt 245 lb 4 oz (111.245 kg)  BMI 37.30 kg/m2  SpO2 96% Wt Readings from Last 3  Encounters:  06/24/15 245 lb 4 oz (111.245 kg)  05/01/15 250 lb (113.399 kg)  01/10/15 243 lb 2 oz (110.281 kg)     Lab Results  Component Value Date   WBC 6.9 04/29/2015   HGB 14.1 04/29/2015   HCT 40.7 04/29/2015   PLT 295.0 04/29/2015   GLUCOSE 102* 04/29/2015   CHOL 168 04/29/2015   TRIG * 04/29/2015  426.0 Triglyceride is over 400; calculations on Lipids are invalid.   HDL 30.40* 04/29/2015   LDLDIRECT 39.0 04/29/2015   LDLCALC SEE COMMENT 10/27/2014   ALT 46 04/29/2015   AST 30 04/29/2015   NA 137 04/29/2015   K 3.9 04/29/2015   CL 106 04/29/2015   CREATININE 0.83 04/29/2015   BUN 13 04/29/2015   CO2 23 04/29/2015   TSH 2.39 04/29/2015   PSA 0.12 01/08/2013   INR 0.9 10/27/2014   HGBA1C 7.0* 04/29/2015   MICROALBUR 0.50 01/08/2013    Lab Results  Component Value Date   TSH 2.39 04/29/2015   Lab Results  Component Value Date   WBC 6.9 04/29/2015   HGB 14.1 04/29/2015   HCT 40.7 04/29/2015   MCV 89.8 04/29/2015   PLT 295.0 04/29/2015   Lab Results  Component Value Date   NA 137 04/29/2015   K 3.9 04/29/2015   CO2 23 04/29/2015   GLUCOSE 102* 04/29/2015   BUN 13 04/29/2015   CREATININE 0.83 04/29/2015   BILITOT 0.6 04/29/2015   ALKPHOS 52 04/29/2015   AST 30 04/29/2015   ALT 46 04/29/2015   PROT 7.0 04/29/2015   ALBUMIN 3.8 04/29/2015   CALCIUM 9.1 04/29/2015   ANIONGAP 4* 10/30/2014   GFR 103.21 04/29/2015   Lab Results  Component Value Date   CHOL 168 04/29/2015   Lab Results  Component Value Date   HDL 30.40* 04/29/2015   Lab Results  Component Value Date   LDLCALC SEE COMMENT 10/27/2014   Lab Results  Component Value Date   TRIG * 04/29/2015    426.0 Triglyceride is over 400; calculations on Lipids are invalid.   Lab Results  Component Value Date   CHOLHDL 6 04/29/2015   Lab Results  Component Value Date   HGBA1C 7.0* 04/29/2015       Assessment & Plan:   Problem List Items Addressed This Visit    Acute bronchitis  - Primary    Started on antibiotics, mucinex and probiotics.       Relevant Medications   HYDROcodone-homatropine (HYCODAN) 5-1.5 MG/5ML syrup   benzonatate (TESSALON) 200 MG capsule   azithromycin (ZITHROMAX) 250 MG tablet   Diabetes mellitus type 2 in obese (HCC)    No flare in numbers with acute illness, minimize simple carbs. Increase exercise as tolerated. Continue current meds      Essential hypertension    Well controlled, no changes to meds. Encouraged heart healthy diet such as the DASH diet and exercise as tolerated.       Hyperlipidemia, mixed    Tolerating statin, encouraged heart healthy diet, avoid trans fats, minimize simple carbs and saturated fats. Increase exercise as tolerated      Obesity    Encouraged DASH diet, decrease po intake and increase exercise as tolerated. Needs 7-8 hours of sleep nightly. Avoid trans fats, eat small, frequent meals every 4-5 hours with lean proteins, complex carbs and healthy fats. Minimize simple carbs         I am having Mr. Ehrman start on HYDROcodone-homatropine, benzonatate, and azithromycin. I am also having him maintain his Clobetasol Prop Emollient Base, ONETOUCH DELICA LANCETS, CLOBEX SPRAY, HUMIRA PEN, ONETOUCH VERIO, WELCHOL, glucose blood, atorvastatin, Insulin Pen Needle, fenofibrate, glimepiride, metFORMIN, diazepam, ROBAXIN, zolpidem, omeprazole, NOVOLOG FLEXPEN, Insulin Glargine, losartan, and escitalopram.  Meds ordered this encounter  Medications  . HYDROcodone-homatropine (HYCODAN) 5-1.5 MG/5ML syrup    Sig: Take 5 mLs by mouth at bedtime as needed for cough.  Dispense:  180 mL    Refill:  0  . benzonatate (TESSALON) 200 MG capsule    Sig: Take 1 capsule (200 mg total) by mouth 2 (two) times daily as needed for cough.    Dispense:  20 capsule    Refill:  0  . azithromycin (ZITHROMAX) 250 MG tablet    Sig: 2 tab po once then 1 tab po daily x 4 days    Dispense:  6 tablet    Refill:  0     Penni Homans,  MD

## 2015-06-28 NOTE — Assessment & Plan Note (Signed)
Encouraged DASH diet, decrease po intake and increase exercise as tolerated. Needs 7-8 hours of sleep nightly. Avoid trans fats, eat small, frequent meals every 4-5 hours with lean proteins, complex carbs and healthy fats. Minimize simple carbs 

## 2015-06-28 NOTE — Assessment & Plan Note (Signed)
Started on antibiotics, mucinex and probiotics.

## 2015-06-30 ENCOUNTER — Other Ambulatory Visit: Payer: Self-pay | Admitting: Family Medicine

## 2015-06-30 MED ORDER — ESCITALOPRAM OXALATE 10 MG PO TABS: 10.0000 mg | ORAL_TABLET | Freq: Every day | ORAL | Status: DC

## 2015-07-02 ENCOUNTER — Other Ambulatory Visit: Payer: Self-pay | Admitting: Family Medicine

## 2015-08-08 ENCOUNTER — Other Ambulatory Visit: Payer: Self-pay | Admitting: Family Medicine

## 2015-08-09 ENCOUNTER — Other Ambulatory Visit: Payer: Self-pay | Admitting: Family Medicine

## 2015-08-11 NOTE — Telephone Encounter (Signed)
Faxed hardcopy for Valium to [facility] in [place]

## 2015-08-11 NOTE — Telephone Encounter (Signed)
Faxed hardcopy for Zolpidem to [place]

## 2015-08-11 NOTE — Telephone Encounter (Signed)
Requesting: Zolpidem Contract none UDS  none Last OV  06/24/2015 Last Refill   #90 with 1 refill 01/10/2015  Please Advise

## 2015-08-19 ENCOUNTER — Other Ambulatory Visit: Payer: BLUE CROSS/BLUE SHIELD

## 2015-08-25 ENCOUNTER — Ambulatory Visit: Payer: BLUE CROSS/BLUE SHIELD | Admitting: Family Medicine

## 2015-09-26 ENCOUNTER — Other Ambulatory Visit: Payer: Self-pay | Admitting: Family Medicine

## 2015-09-29 NOTE — Telephone Encounter (Signed)
Faxed hardcopy for Valium to [facility] in [place] 

## 2015-11-05 DIAGNOSIS — H524 Presbyopia: Secondary | ICD-10-CM | POA: Diagnosis not present

## 2015-11-05 DIAGNOSIS — H52223 Regular astigmatism, bilateral: Secondary | ICD-10-CM | POA: Diagnosis not present

## 2015-11-05 DIAGNOSIS — H5203 Hypermetropia, bilateral: Secondary | ICD-10-CM | POA: Diagnosis not present

## 2015-11-06 ENCOUNTER — Encounter: Payer: Self-pay | Admitting: Family Medicine

## 2015-11-06 ENCOUNTER — Ambulatory Visit (INDEPENDENT_AMBULATORY_CARE_PROVIDER_SITE_OTHER): Payer: BLUE CROSS/BLUE SHIELD | Admitting: Family Medicine

## 2015-11-06 VITALS — BP 112/70 | HR 101 | Temp 98.2°F | Ht 68.0 in | Wt 237.1 lb

## 2015-11-06 DIAGNOSIS — L409 Psoriasis, unspecified: Secondary | ICD-10-CM

## 2015-11-06 DIAGNOSIS — Z0000 Encounter for general adult medical examination without abnormal findings: Secondary | ICD-10-CM

## 2015-11-06 DIAGNOSIS — E781 Pure hyperglyceridemia: Secondary | ICD-10-CM

## 2015-11-06 DIAGNOSIS — E119 Type 2 diabetes mellitus without complications: Secondary | ICD-10-CM | POA: Diagnosis not present

## 2015-11-06 DIAGNOSIS — E1169 Type 2 diabetes mellitus with other specified complication: Secondary | ICD-10-CM

## 2015-11-06 DIAGNOSIS — K219 Gastro-esophageal reflux disease without esophagitis: Secondary | ICD-10-CM | POA: Diagnosis not present

## 2015-11-06 DIAGNOSIS — E669 Obesity, unspecified: Secondary | ICD-10-CM

## 2015-11-06 DIAGNOSIS — E782 Mixed hyperlipidemia: Secondary | ICD-10-CM | POA: Diagnosis not present

## 2015-11-06 DIAGNOSIS — I10 Essential (primary) hypertension: Secondary | ICD-10-CM | POA: Diagnosis not present

## 2015-11-06 DIAGNOSIS — F418 Other specified anxiety disorders: Secondary | ICD-10-CM

## 2015-11-06 NOTE — Assessment & Plan Note (Signed)
Dermatology is following

## 2015-11-06 NOTE — Assessment & Plan Note (Addendum)
Encouraged heart healthy diet, increase exercise, avoid trans fats, consider a krill oil cap daily. Tolerating Fenofibrate but is not taking meds lately

## 2015-11-06 NOTE — Progress Notes (Signed)
Pre visit review using our clinic review tool, if applicable. No additional management support is needed unless otherwise documented below in the visit note. 

## 2015-11-06 NOTE — Assessment & Plan Note (Signed)
Well controlled, no changes to meds. Encouraged heart healthy diet such as the DASH diet and exercise as tolerated.  °

## 2015-11-06 NOTE — Patient Instructions (Signed)

## 2015-11-06 NOTE — Assessment & Plan Note (Signed)
Avoid offending foods, start probiotics. Do not eat large meals in late evening and consider raising head of bed.  

## 2015-11-06 NOTE — Assessment & Plan Note (Signed)
Patient acknowledges he has been increasingly anxious, struggling with anhedonia and irritability, is offered increasedd doing if Escitalpopram and he declines for now. He will call if he decides to increase dosing

## 2015-11-06 NOTE — Progress Notes (Signed)
Subjective:    Patient ID: [name], male    DOB: [birth year], 55 y.o.   MRN: [id number]  Chief Complaint  Patient presents with  . Follow-up    4 month    HPI Patient is in today for follow up.  Patient has not been taking any medication.  He does ont have a good reason why. He acknowledges he has been struggling with anhedonia and irritability and he chose to stop his meds. No suicidal or homicidal ideation. No recent illness or acute complaints. hgba1c acceptable, minimize simple carbs. Increase exercise as tolerated. Continue current meds  Past Medical History  Diagnosis Date  . Obesity   . Iron deficiency anemia 2011  . Psoriasis   . Hepatic artery stenosis ([place])   . Colon cancer ([place])     right colon cancer- adenocarcinoma CEA level isnrmal at 1.8  . Diabetes mellitus   . Hypertension   . Hyperlipidemia   . Hypertriglyceridemia 12/14/2010  . Preventative health care 10/08/2012  . Anxiety associated with depression 10/11/2013    Past Surgical History  Procedure Laterality Date  . Cholecystectomy  1994  . Hemicolectomy      12/17/2009 right  . Colonoscopy    . Polypectomy    . Carpal tunnel release      RIGHT    Family History  Problem Relation Age of Onset  . Stomach cancer Mother     diedinher 38's  . Diabetes type II Brother     boderline  . Diabetes Brother     type II  . Other Neg Hx     cad,prostate ca, colon ca  . Coronary artery disease Neg Hx   . Cancer Neg Hx     colon, prostate  . Alcohol abuse Brother     Social History   Social History  . Marital Status: Single    Spouse Name: [name]  . Number of Children: 0  . Years of Education: N/A   Occupational History  . Chartered certified accountant and fax   Social History Main Topics  . Smoking status: Current Some Day Smoker    Types: Cigarettes  . Smokeless tobacco: Never Used  . Alcohol Use: No  . Drug Use: No  . Sexual Activity: Yes     Comment: lives with girlfriend, no dietary  restrictions   Other Topics Concern  . Not on file   Social History Narrative   Occupation: Theatre stage manager and fax)   Single  (lives with [name])   no children    former smoker   Illicit Drug Use - no     Outpatient Prescriptions Prior to Visit  Medication Sig Dispense Refill  . atorvastatin (LIPITOR) 10 MG tablet Take 1 tablet (10 mg total) by mouth daily. 90 tablet 3  . Blood Glucose Monitoring Suppl (ONETOUCH VERIO) W/DEVICE KIT   0  . Clobetasol Prop Emollient Base 0.05 % emollient cream Apply topically daily as needed.      Marland Kitchen CLOBEX SPRAY 0.05 % external spray     . diazepam (VALIUM) 5 MG tablet TAKE 1 TABLET BY MOUTH EVERY 12 HOURS 60 tablet 1  . escitalopram (LEXAPRO) 10 MG tablet Take 1 tablet (10 mg total) by mouth daily. 90 tablet 1  . fenofibrate 160 MG tablet Take 1 tablet (160 mg total) by mouth daily. 90 tablet 2  . glimepiride (AMARYL) 1 MG tablet TAKE 1 TABLET BY MOUTH EVERY DAY BEFORE BREAKFAST 90 tablet 2  .  glucose blood (ONETOUCH VERIO) test strip Test three times daily, DX E11.9 300 each 6  . HUMIRA PEN 40 MG/0.8ML PNKT     . Insulin Pen Needle (B-D ULTRAFINE III SHORT PEN) 31G X 8 MM MISC Test as directed three times daily.  DX E11.9 300 each 3  . losartan (COZAAR) 50 MG tablet Take 1 tablet (50 mg total) by mouth daily. 90 tablet 1  . methocarbamol (ROBAXIN) 500 MG tablet TAKE 1 TABLET EVERY 8 HOURSAS NEEDED FOR MUSCLE SPASMS 180 tablet 1  . NOVOLOG FLEXPEN 100 UNIT/ML FlexPen INJECT 10-16 UNITS UNDER SKIN 3 TIMES A DAY BEFORE MEALS 5 pen 5  . omeprazole (PRILOSEC) 40 MG capsule TAKE ONE CAPSULE BY MOUTH TWICE A DAY 60 capsule 5  . [place] LANCETS MISC     . WELCHOL 625 MG tablet Take 3 tablets (1,875 mg total) by mouth 2 (two) times daily with a meal. 180 tablet 5  . zolpidem (AMBIEN) 10 MG tablet TAKE 1 TABLET BY MOUTH AT BEDTIME AS NEEDED 90 tablet 0  . azithromycin (ZITHROMAX) 250 MG tablet 2 tab po once then 1 tab po daily x 4 days 6 tablet 0    . benzonatate (TESSALON) 200 MG capsule Take 1 capsule (200 mg total) by mouth 2 (two) times daily as needed for cough. 20 capsule 0  . HYDROcodone-homatropine (HYCODAN) 5-1.5 MG/5ML syrup Take 5 mLs by mouth at bedtime as needed for cough. 180 mL 0  . Insulin Glargine (LANTUS SOLOSTAR) 100 UNIT/ML Solostar Pen INJECT 60 UNITS SUBCUTANEOUS EVERY NIGHT AT 10PM **PER INSURANSE 3 BOXES FOR 90 DAYS** (Patient not taking: Reported on 11/06/2015) 45 pen 3  . LANTUS SOLOSTAR 100 UNIT/ML Solostar Pen INJECT 60 UNITS SUBCUTANEOUS EVERY NIGHT AT 10PM (Patient not taking: Reported on 11/06/2015) 45 mL 1  . metFORMIN (GLUCOPHAGE) 1000 MG tablet Take 1 tablet (1,000 mg total) by mouth 2 (two) times daily with a meal. (Patient not taking: Reported on 11/06/2015) 180 tablet 2   No facility-administered medications prior to visit.    No Known Allergies  Review of Systems  Constitutional: Negative for fever and malaise/fatigue.  HENT: Negative for congestion.   Eyes: Negative for blurred vision.  Respiratory: Negative for shortness of breath.   Cardiovascular: Negative for chest pain, palpitations and leg swelling.  Gastrointestinal: Negative for nausea, abdominal pain and blood in stool.  Genitourinary: Negative for dysuria and frequency.  Musculoskeletal: Negative for falls.  Skin: Negative for rash.  Neurological: Negative for dizziness, loss of consciousness and headaches.  Endo/Heme/Allergies: Negative for environmental allergies.  Psychiatric/Behavioral: Negative for depression. The patient is not nervous/anxious.        Objective:    Physical Exam  Constitutional: He is oriented to person, place, and time. He appears well-developed and well-nourished. No distress.  HENT:  Head: Normocephalic and atraumatic.  Eyes: Conjunctivae are normal.  Neck: Neck supple. No thyromegaly present.  Cardiovascular: Normal rate, regular rhythm and normal heart sounds.   No murmur heard. Pulmonary/Chest:  Effort normal and breath sounds normal. No respiratory distress. He has no wheezes.  Abdominal: Soft. Bowel sounds are normal. He exhibits no mass. There is no tenderness.  Musculoskeletal: He exhibits no edema.  Lymphadenopathy:    He has no cervical adenopathy.  Neurological: He is alert and oriented to person, place, and time.  Skin: Skin is warm and dry.  Psychiatric: He has a normal mood and affect. His behavior is normal.    BP 112/70 mmHg  Pulse 101  Temp(Src) 98.2 F (36.8 C) (Oral)  Ht '5\' 8"'  (1.727 m)  Wt 237 lb 2 oz (107.559 kg)  BMI 36.06 kg/m2  SpO2 94% Wt Readings from Last 3 Encounters:  11/06/15 237 lb 2 oz (107.559 kg)  06/24/15 245 lb 4 oz (111.245 kg)  05/01/15 250 lb (113.399 kg)     Lab Results  Component Value Date   WBC 6.9 04/29/2015   HGB 14.1 04/29/2015   HCT 40.7 04/29/2015   PLT 295.0 04/29/2015   GLUCOSE 102* 04/29/2015   CHOL 168 04/29/2015   TRIG * 04/29/2015    426.0 Triglyceride is over 400; calculations on Lipids are invalid.   HDL 30.40* 04/29/2015   LDLDIRECT 39.0 04/29/2015   LDLCALC SEE COMMENT 10/27/2014   ALT 46 04/29/2015   AST 30 04/29/2015   NA 137 04/29/2015   K 3.9 04/29/2015   CL 106 04/29/2015   CREATININE 0.83 04/29/2015   BUN 13 04/29/2015   CO2 23 04/29/2015   TSH 2.39 04/29/2015   PSA 0.12 01/08/2013   INR 0.9 10/27/2014   HGBA1C 7.0* 04/29/2015   MICROALBUR 0.50 01/08/2013    Lab Results  Component Value Date   TSH 2.39 04/29/2015   Lab Results  Component Value Date   WBC 6.9 04/29/2015   HGB 14.1 04/29/2015   HCT 40.7 04/29/2015   MCV 89.8 04/29/2015   PLT 295.0 04/29/2015   Lab Results  Component Value Date   NA 137 04/29/2015   K 3.9 04/29/2015   CO2 23 04/29/2015   GLUCOSE 102* 04/29/2015   BUN 13 04/29/2015   CREATININE 0.83 04/29/2015   BILITOT 0.6 04/29/2015   ALKPHOS 52 04/29/2015   AST 30 04/29/2015   ALT 46 04/29/2015   PROT 7.0 04/29/2015   ALBUMIN 3.8 04/29/2015   CALCIUM 9.1  04/29/2015   ANIONGAP 4* 10/30/2014   GFR 103.21 04/29/2015   Lab Results  Component Value Date   CHOL 168 04/29/2015   Lab Results  Component Value Date   HDL 30.40* 04/29/2015   Lab Results  Component Value Date   LDLCALC SEE COMMENT 10/27/2014   Lab Results  Component Value Date   TRIG * 04/29/2015    426.0 Triglyceride is over 400; calculations on Lipids are invalid.   Lab Results  Component Value Date   CHOLHDL 6 04/29/2015   Lab Results  Component Value Date   HGBA1C 7.0* 04/29/2015       Assessment & Plan:   Problem List Items Addressed This Visit    Anxiety associated with depression    Patient acknowledges he has been increasingly anxious, struggling with anhedonia and irritability, is offered increasedd doing if Escitalpopram and he declines for now. He will call if he decides to increase dosing      Diabetes mellitus type 2 in obese [facility])    Has only been using his Lantus a couple of times a week. He denies polyuria or polydipsia. Restart meds regularly and check labs. Minimize simple carbohydrates      Relevant Orders   CBC   Lipid panel   Comprehensive metabolic panel   Hemoglobin A1c   Essential hypertension    Well controlled, no changes to meds. Encouraged heart healthy diet such as the DASH diet and exercise as tolerated.       Relevant Orders   CBC   Lipid panel   Comprehensive metabolic panel   Hemoglobin A1c   GERD - Primary  Avoid offending foods, start probiotics. Do not eat large meals in late evening and consider raising head of bed.       Hyperlipidemia, mixed    Encouraged heart healthy diet, increase exercise, avoid trans fats, consider a krill oil cap daily. Tolerating Fenofibrate but is not taking meds lately      Relevant Orders   CBC   Lipid panel   Comprehensive metabolic panel   Hemoglobin A1c   Hypertriglyceridemia   Relevant Orders   CBC   Lipid panel   Comprehensive metabolic panel   Hemoglobin A1c    Obesity    Encouraged DASH diet, decrease po intake and increase exercise as tolerated. Needs 7-8 hours of sleep nightly. Avoid trans fats, eat small, frequent meals every 4-5 hours with lean proteins, complex carbs and healthy fats. Minimize simple carbs      Preventative health care   Relevant Orders   CBC   Lipid panel   Comprehensive metabolic panel   Hemoglobin A1c   Psoriasis    Dermatology is following         I have discontinued Mr. Eisemann HYDROcodone-homatropine, benzonatate, and azithromycin. I am also having him maintain his Clobetasol Prop Emollient Base, ONETOUCH DELICA LANCETS, CLOBEX SPRAY, HUMIRA PEN, ONETOUCH VERIO, WELCHOL, glucose blood, atorvastatin, Insulin Pen Needle, fenofibrate, glimepiride, metFORMIN, omeprazole, NOVOLOG FLEXPEN, Insulin Glargine, losartan, escitalopram, LANTUS SOLOSTAR, methocarbamol, zolpidem, and diazepam.  No orders of the defined types were placed in this encounter.     Penni Homans, MD

## 2015-11-06 NOTE — Assessment & Plan Note (Signed)
Has only been using his Lantus a couple of times a week. He denies polyuria or polydipsia. Restart meds regularly and check labs. Minimize simple carbohydrates

## 2015-11-06 NOTE — Assessment & Plan Note (Signed)
Encouraged DASH diet, decrease po intake and increase exercise as tolerated. Needs 7-8 hours of sleep nightly. Avoid trans fats, eat small, frequent meals every 4-5 hours with lean proteins, complex carbs and healthy fats. Minimize simple carbs 

## 2015-11-07 ENCOUNTER — Other Ambulatory Visit: Payer: Self-pay | Admitting: Family Medicine

## 2015-11-07 LAB — COMPREHENSIVE METABOLIC PANEL
ALBUMIN: 4 g/dL (ref 3.5–5.2)
ALT: 69 U/L — ABNORMAL HIGH (ref 0–53)
AST: 39 U/L — AB (ref 0–37)
Alkaline Phosphatase: 80 U/L (ref 39–117)
BUN: 11 mg/dL (ref 6–23)
CALCIUM: 9.2 mg/dL (ref 8.4–10.5)
CHLORIDE: 100 meq/L (ref 96–112)
CO2: 23 mEq/L (ref 19–32)
CREATININE: 0.79 mg/dL (ref 0.40–1.50)
GFR: 109.05 mL/min (ref >60.00)
Glucose, Bld: 257 mg/dL — ABNORMAL HIGH (ref 70–99)
Potassium: 4.1 mEq/L (ref 3.5–5.1)
Sodium: 133 mEq/L — ABNORMAL LOW (ref 135–145)
Total Bilirubin: 0.7 mg/dL (ref 0.2–1.2)
Total Protein: 7 g/dL (ref 6.0–8.3)

## 2015-11-07 LAB — CBC
HCT: 42.9 % (ref 39.0–52.0)
Hemoglobin: 15.4 g/dL (ref 13.0–17.0)
MCHC: 35.8 g/dL (ref 30.0–36.0)
MCV: 89.3 fl (ref 78.0–100.0)
Platelets: 284 10*3/uL (ref 150.0–400.0)
RBC: 4.8 Mil/uL (ref 4.22–5.81)
RDW: 12.3 % (ref 11.5–15.5)
WBC: 9.9 10*3/uL (ref 4.0–10.5)

## 2015-11-07 LAB — LIPID PANEL
CHOLESTEROL: 317 mg/dL — AB (ref 0–200)
HDL: 25.7 mg/dL — ABNORMAL LOW (ref >39.00)
Total CHOL/HDL Ratio: 12

## 2015-11-07 LAB — HEMOGLOBIN A1C: HEMOGLOBIN A1C: 10.2 % — AB (ref 4.6–6.5)

## 2015-11-07 LAB — LDL CHOLESTEROL, DIRECT: LDL DIRECT: 50 mg/dL

## 2015-11-08 ENCOUNTER — Encounter: Payer: Self-pay | Admitting: Family Medicine

## 2015-11-11 ENCOUNTER — Other Ambulatory Visit: Payer: Self-pay | Admitting: Family Medicine

## 2015-11-11 MED ORDER — INSULIN DETEMIR 100 UNIT/ML FLEXPEN: 60.0000 [IU] | PEN_INJECTOR | Freq: Every day | SUBCUTANEOUS | Status: DC

## 2015-11-27 ENCOUNTER — Other Ambulatory Visit: Payer: Self-pay | Admitting: Family Medicine

## 2015-12-01 ENCOUNTER — Other Ambulatory Visit: Payer: Self-pay | Admitting: Family Medicine

## 2015-12-01 MED ORDER — INSULIN ASPART 100 UNIT/ML FLEXPEN: PEN_INJECTOR | SUBCUTANEOUS | Status: DC

## 2015-12-05 ENCOUNTER — Telehealth: Payer: Self-pay | Admitting: Family Medicine

## 2015-12-05 ENCOUNTER — Telehealth: Payer: Self-pay | Admitting: *Deleted

## 2015-12-05 ENCOUNTER — Ambulatory Visit
Admission: EM | Admit: 2015-12-05 | Discharge: 2015-12-05 | Disposition: A | Discharge status: Home / Self Care | Payer: BLUE CROSS/BLUE SHIELD | Attending: Family Medicine | Admitting: Family Medicine

## 2015-12-05 ENCOUNTER — Encounter: Payer: Self-pay | Admitting: Emergency Medicine

## 2015-12-05 ENCOUNTER — Ambulatory Visit (INDEPENDENT_AMBULATORY_CARE_PROVIDER_SITE_OTHER): Payer: BLUE CROSS/BLUE SHIELD

## 2015-12-05 DIAGNOSIS — S9032XA Contusion of left foot, initial encounter: Secondary | ICD-10-CM | POA: Diagnosis not present

## 2015-12-05 DIAGNOSIS — S92412A Displaced fracture of proximal phalanx of left great toe, initial encounter for closed fracture: Secondary | ICD-10-CM | POA: Diagnosis not present

## 2015-12-05 DIAGNOSIS — S92422A Displaced fracture of distal phalanx of left great toe, initial encounter for closed fracture: Secondary | ICD-10-CM | POA: Diagnosis not present

## 2015-12-05 MED ORDER — OXYCODONE-ACETAMINOPHEN 5-325 MG PO TABS: 1.0000 | ORAL_TABLET | Freq: Three times a day (TID) | ORAL | Status: DC | PRN
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
## 2015-12-05 NOTE — Telephone Encounter (Signed)
I will place podiatry referral
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
## 2015-12-05 NOTE — Telephone Encounter (Signed)
Pt seen at [facility], we can access their documentation. They have already placed urgent podiatry referral for pt. Per their note, he does not need to f/u w/ PCP until next week. I would think he should get work note from urgent care, since they saw him, but will defer to [employer].

## 2015-12-05 NOTE — ED Notes (Signed)
Called patient and informed him that he has an appointment with Dr Cleda Mccreedy @ 1:30 on Monday 12/08/2014 in [place]. Patient confirmed date, time, and location of appointment.

## 2015-12-05 NOTE — Discharge Instructions (Signed)
Take medication as prescribed. Rest. Apply ice and elevate. Keep in splint and use crutches.   Follow up with podiatry as soon as possible as discussed.   Follow up with your primary care physician this week as needed. Return to Urgent care for new or worsening concerns.    Contusion A contusion is a deep bruise. Contusions are the result of a blunt injury to tissues and muscle fibers under the skin. The injury causes bleeding under the skin. The skin overlying the contusion may turn blue, purple, or yellow. Minor injuries will give you a painless contusion, but more severe contusions may stay painful and swollen for a few weeks.  CAUSES  This condition is usually caused by a blow, trauma, or direct force to an area of the body. SYMPTOMS  Symptoms of this condition include:  Swelling of the injured area.  Pain and tenderness in the injured area.  Discoloration. The area may have redness and then turn blue, purple, or yellow. DIAGNOSIS  This condition is diagnosed based on a physical exam and medical history. An X-ray, CT scan, or MRI may be needed to determine if there are any associated injuries, such as broken bones (fractures). TREATMENT  Specific treatment for this condition depends on what area of the body was injured. In general, the best treatment for a contusion is resting, icing, applying pressure to (compression), and elevating the injured area. This is often called the RICE strategy. Over-the-counter anti-inflammatory medicines may also be recommended for pain control.  HOME CARE INSTRUCTIONS   Rest the injured area.  If directed, apply ice to the injured area:  Put ice in a plastic bag.  Place a towel between your skin and the bag.  Leave the ice on for 20 minutes, 2-3 times per day.  If directed, apply light compression to the injured area using an elastic bandage. Make sure the bandage is not wrapped too tightly. Remove and reapply the bandage as directed by your  health care provider.  If possible, raise (elevate) the injured area above the level of your heart while you are sitting or lying down.  Take over-the-counter and prescription medicines only as told by your health care provider. SEEK MEDICAL CARE IF:  Your symptoms do not improve after several days of treatment.  Your symptoms get worse.  You have difficulty moving the injured area. SEEK IMMEDIATE MEDICAL CARE IF:   You have severe pain.  You have numbness in a hand or foot.  Your hand or foot turns pale or cold.   This information is not intended to replace advice given to you by your health care provider. Make sure you discuss any questions you have with your health care provider.   Document Released: 04/14/2005 Document Revised: 03/26/2015 Document Reviewed: 11/20/2014 Elsevier Interactive Patient Education 2016 [employer]  Toe Fracture A toe fracture is a break in one of the toe bones (phalanges). CAUSES This condition may be caused by:  Dropping a heavy object on your toe.  Stubbing your toe.  Overusing your toe or doing repetitive exercise.  Twisting or stretching your toe out of place. RISK FACTORS This condition is more likely to develop in people who:  Play contact sports.  Have a bone disease.  Have a low calcium level. SYMPTOMS The main symptoms of this condition are swelling and pain in the toe. The pain may get worse with standing or walking. Other symptoms include:  Bruising.  Stiffness.  Numbness.  A change in the  way the toe looks.  Broken bones that poke through the skin.  Blood beneath the toenail. DIAGNOSIS This condition is diagnosed with a physical exam. You may also have X-rays. TREATMENT  Treatment for this condition depends on the type of fracture and its severity. Treatment may involve:  Taping the broken toe to a toe that is next to it (buddy taping). This is the most common treatment for fractures in which the bone has not  moved out of place (nondisplaced fracture).  Wearing a shoe that has a wide, rigid sole to protect the toe and to limit its movement.  Wearing a walking cast.  Having a procedure to move the toe back into place.  Surgery. This may be needed:  If there are many pieces of broken bone that are out of place (displaced).  If the toe joint breaks.  If the bone breaks through the skin.  Physical therapy. This is done to help regain movement and strength in the toe. You may need follow-up X-rays to make sure that the bone is healing well and staying in position. HOME CARE INSTRUCTIONS If You Have a Cast:  Do not stick anything inside the cast to scratch your skin. Doing that increases your risk of infection.  Check the skin around the cast every day. Report any concerns to your health care provider. You may put lotion on dry skin around the edges of the cast. Do not apply lotion to the skin underneath the cast.  Do not put pressure on any part of the cast until it is fully hardened. This may take several hours.  Keep the cast clean and dry. Bathing  Do not take baths, swim, or use a hot tub until your health care provider approves. Ask your health care provider if you can take showers. You may only be allowed to take sponge baths for bathing.  If your health care provider approves bathing and showering, cover the cast or bandage (dressing) with a watertight plastic bag to protect it from water. Do not let the cast or dressing get wet. Managing Pain, Stiffness, and Swelling  If you do not have a cast, apply ice to the injured area, if directed.  Put ice in a plastic bag.  Place a towel between your skin and the bag.  Leave the ice on for 20 minutes, 2-3 times per day.  Move your toes often to avoid stiffness and to lessen swelling.  Raise (elevate) the injured area above the level of your heart while you are sitting or lying down. Driving  Do not drive or operate heavy  machinery while taking pain medicine.  Do not drive while wearing a cast on a foot that you use for driving. Activity  Return to your normal activities as directed by your health care provider. Ask your health care provider what activities are safe for you.  Perform exercises daily as directed by your health care provider or physical therapist. Safety  Do not use the injured limb to support your body weight until your health care provider says that you can. Use crutches or other assistive devices as directed by your health care provider. General Instructions  If your toe was treated with buddy taping, follow your health care provider's instructions for changing the gauze and tape. Change it more often:  The gauze and tape get wet. If this happens, dry the space between the toes.  The gauze and tape are too tight and cause your toe to become pale  or numb.  Wear a protective shoe as directed by your health care provider. If you were not given a protective shoe, wear sturdy, supportive shoes. Your shoes should not pinch your toes and should not fit tightly against your toes.  Do not use any tobacco products, including cigarettes, chewing tobacco, or e-cigarettes. Tobacco can delay bone healing. If you need help quitting, ask your health care provider.  Take medicines only as directed by your health care provider.  Keep all follow-up visits as directed by your health care provider. This is important. SEEK MEDICAL CARE IF:  You have a fever.  Your pain medicine is not helping.  Your toe is cold.  Your toe is numb.  You still have pain after one week of rest and treatment.  You still have pain after your health care provider has said that you can start walking again.  You have pain, tingling, or numbness in your foot that is not going away. SEEK IMMEDIATE MEDICAL CARE IF:  You have severe pain.  You have redness or inflammation in your toe that is getting worse.  You have  pain or numbness in your toe that is getting worse.  Your toe turns blue.   This information is not intended to replace advice given to you by your health care provider. Make sure you discuss any questions you have with your health care provider.   Document Released: 07/02/2000 Document Revised: 03/26/2015 Document Reviewed: 05/01/2014 Elsevier Interactive Patient Education [employer].
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
## 2015-12-08 ENCOUNTER — Other Ambulatory Visit: Payer: Self-pay | Admitting: Family Medicine

## 2015-12-08 DIAGNOSIS — M79672 Pain in left foot: Secondary | ICD-10-CM | POA: Diagnosis not present

## 2015-12-08 DIAGNOSIS — S92412A Displaced fracture of proximal phalanx of left great toe, initial encounter for closed fracture: Secondary | ICD-10-CM | POA: Diagnosis not present

## 2015-12-08 DIAGNOSIS — E119 Type 2 diabetes mellitus without complications: Secondary | ICD-10-CM | POA: Diagnosis not present

## 2015-12-17 DIAGNOSIS — E119 Type 2 diabetes mellitus without complications: Secondary | ICD-10-CM | POA: Diagnosis not present

## 2015-12-17 DIAGNOSIS — S92412D Displaced fracture of proximal phalanx of left great toe, subsequent encounter for fracture with routine healing: Secondary | ICD-10-CM | POA: Diagnosis not present

## 2015-12-17 DIAGNOSIS — M79672 Pain in left foot: Secondary | ICD-10-CM | POA: Diagnosis not present

## 2015-12-18 DIAGNOSIS — L400 Psoriasis vulgaris: Secondary | ICD-10-CM | POA: Diagnosis not present

## 2015-12-18 DIAGNOSIS — B356 Tinea cruris: Secondary | ICD-10-CM | POA: Diagnosis not present

## 2015-12-18 DIAGNOSIS — D2262 Melanocytic nevi of left upper limb, including shoulder: Secondary | ICD-10-CM | POA: Diagnosis not present

## 2015-12-22 DIAGNOSIS — Z79899 Other long term (current) drug therapy: Secondary | ICD-10-CM | POA: Diagnosis not present

## 2015-12-24 DIAGNOSIS — L400 Psoriasis vulgaris: Secondary | ICD-10-CM | POA: Diagnosis not present

## 2015-12-26 ENCOUNTER — Other Ambulatory Visit: Payer: Self-pay | Admitting: Family Medicine

## 2015-12-29 NOTE — Telephone Encounter (Signed)
Faxed hardcopy for Valium to [facility] in [place]

## 2016-01-08 DIAGNOSIS — S92412D Displaced fracture of proximal phalanx of left great toe, subsequent encounter for fracture with routine healing: Secondary | ICD-10-CM | POA: Diagnosis not present

## 2016-01-28 DIAGNOSIS — Z79899 Other long term (current) drug therapy: Secondary | ICD-10-CM | POA: Diagnosis not present

## 2016-01-28 DIAGNOSIS — L400 Psoriasis vulgaris: Secondary | ICD-10-CM | POA: Diagnosis not present

## 2016-01-29 ENCOUNTER — Ambulatory Visit (INDEPENDENT_AMBULATORY_CARE_PROVIDER_SITE_OTHER): Payer: BLUE CROSS/BLUE SHIELD | Admitting: Family Medicine

## 2016-01-29 ENCOUNTER — Encounter: Payer: Self-pay | Admitting: Family Medicine

## 2016-01-29 VITALS — BP 124/80 | HR 78 | Temp 98.4°F | Ht 68.0 in | Wt 244.4 lb

## 2016-01-29 DIAGNOSIS — K219 Gastro-esophageal reflux disease without esophagitis: Secondary | ICD-10-CM

## 2016-01-29 DIAGNOSIS — E1169 Type 2 diabetes mellitus with other specified complication: Secondary | ICD-10-CM

## 2016-01-29 DIAGNOSIS — E119 Type 2 diabetes mellitus without complications: Secondary | ICD-10-CM

## 2016-01-29 DIAGNOSIS — E669 Obesity, unspecified: Secondary | ICD-10-CM | POA: Diagnosis not present

## 2016-01-29 DIAGNOSIS — R002 Palpitations: Secondary | ICD-10-CM | POA: Diagnosis not present

## 2016-01-29 DIAGNOSIS — L409 Psoriasis, unspecified: Secondary | ICD-10-CM

## 2016-01-29 DIAGNOSIS — E782 Mixed hyperlipidemia: Secondary | ICD-10-CM | POA: Diagnosis not present

## 2016-01-29 DIAGNOSIS — E781 Pure hyperglyceridemia: Secondary | ICD-10-CM

## 2016-01-29 DIAGNOSIS — I10 Essential (primary) hypertension: Secondary | ICD-10-CM

## 2016-01-29 DIAGNOSIS — S92412D Displaced fracture of proximal phalanx of left great toe, subsequent encounter for fracture with routine healing: Secondary | ICD-10-CM | POA: Diagnosis not present

## 2016-01-29 DIAGNOSIS — D649 Anemia, unspecified: Secondary | ICD-10-CM

## 2016-01-29 LAB — CBC
HEMATOCRIT: 40.3 % (ref 39.0–52.0)
Hemoglobin: 13.8 g/dL (ref 13.0–17.0)
MCHC: 34.3 g/dL (ref 30.0–36.0)
MCV: 90.8 fl (ref 78.0–100.0)
Platelets: 270 10*3/uL (ref 150.0–400.0)
RBC: 4.44 Mil/uL (ref 4.22–5.81)
RDW: 12.1 % (ref 11.5–15.5)
WBC: 9.1 10*3/uL (ref 4.0–10.5)

## 2016-01-29 LAB — TSH: TSH: 1.61 u[IU]/mL (ref 0.35–4.50)

## 2016-01-29 LAB — HEMOGLOBIN A1C: HEMOGLOBIN A1C: 7.7 % — AB (ref 4.6–6.5)

## 2016-01-29 MED ORDER — METOPROLOL SUCCINATE ER 25 MG PO TB24: 25.0000 mg | ORAL_TABLET | Freq: Every day | ORAL | Status: DC

## 2016-01-29 NOTE — Assessment & Plan Note (Addendum)
Happens mostly at bedtime. Does not awaken him. Is vigorous to the point that he cannot fall asleep. Does not occur during the day. He is to minimize caffeine and if symptoms worsen will need referral

## 2016-01-29 NOTE — Assessment & Plan Note (Signed)
Well controlled, no changes to meds. Encouraged heart healthy diet such as the DASH diet and exercise as tolerated.  °

## 2016-01-29 NOTE — Patient Instructions (Signed)

## 2016-01-29 NOTE — Progress Notes (Signed)
Pre visit review using our clinic review tool, if applicable. No additional management support is needed unless otherwise documented below in the visit note. 

## 2016-01-29 NOTE — Assessment & Plan Note (Signed)
Follows with Dr Sarina Ser of dermatology. He had a previous dermatologist Dr Marko Stai but has changed. They have stopped Humira and have switched him to [facility]

## 2016-01-30 LAB — COMPREHENSIVE METABOLIC PANEL
ALBUMIN: 4 g/dL (ref 3.5–5.2)
ALT: 60 U/L — ABNORMAL HIGH (ref 0–53)
AST: 40 U/L — AB (ref 0–37)
Alkaline Phosphatase: 51 U/L (ref 39–117)
BILIRUBIN TOTAL: 1.1 mg/dL (ref 0.2–1.2)
BUN: 14 mg/dL (ref 6–23)
CALCIUM: 9.3 mg/dL (ref 8.4–10.5)
CO2: 22 mEq/L (ref 19–32)
CREATININE: 0.9 mg/dL (ref 0.40–1.50)
Chloride: 104 mEq/L (ref 96–112)
GFR: 93.73 mL/min (ref >60.00)
Glucose, Bld: 133 mg/dL — ABNORMAL HIGH (ref 70–99)
Potassium: 4.1 mEq/L (ref 3.5–5.1)
SODIUM: 137 meq/L (ref 135–145)
TOTAL PROTEIN: 7.2 g/dL (ref 6.0–8.3)

## 2016-01-30 LAB — LIPID PANEL
Cholesterol: 158 mg/dL (ref 0–200)
HDL: 30.9 mg/dL — AB (ref >39.00)
NonHDL: 126.96
Total CHOL/HDL Ratio: 5
Triglycerides: 311 mg/dL — ABNORMAL HIGH (ref 0.0–149.0)
VLDL: 62.2 mg/dL — ABNORMAL HIGH (ref 0.0–40.0)

## 2016-01-30 LAB — LDL CHOLESTEROL, DIRECT: LDL DIRECT: 67 mg/dL

## 2016-02-02 ENCOUNTER — Other Ambulatory Visit: Payer: Self-pay | Admitting: Family Medicine

## 2016-02-02 MED ORDER — ATORVASTATIN CALCIUM 20 MG PO TABS: 20.0000 mg | ORAL_TABLET | Freq: Every day | ORAL | Status: DC

## 2016-02-06 ENCOUNTER — Other Ambulatory Visit: Payer: Self-pay | Admitting: Family Medicine

## 2016-02-06 MED ORDER — INSULIN DETEMIR 100 UNIT/ML FLEXPEN: 60.0000 [IU] | PEN_INJECTOR | Freq: Every day | SUBCUTANEOUS | Status: DC

## 2016-02-15 NOTE — Assessment & Plan Note (Signed)
Avoid offending foods, start probiotics. Do not eat large meals in late evening and consider raising head of bed.  

## 2016-02-15 NOTE — Assessment & Plan Note (Signed)
Tolerating statin, encouraged heart healthy diet, avoid trans fats, minimize simple carbs and saturated fats. Increase exercise as tolerated 

## 2016-02-15 NOTE — Assessment & Plan Note (Signed)
hgba1c acceptable, improved significantly, still slightly elevated. minimize simple carbs. Increase exercise as tolerated. Continue current meds

## 2016-02-15 NOTE — Progress Notes (Signed)
Patient ID: [name], male   DOB: [birth year], 55 y.o.   MRN: [id number]   Subjective:    Patient ID: [name], male    DOB: 15-Sep-1960, 55 y.o.   MRN: [id number]  Chief Complaint  Patient presents with  . Follow-up    HPI Patient is in today for follow up on numerous concerns. No recent hospitalizations or acute illness. Did break his left great toe was broken about 6 weeks ago but it is improving. Struggles with psoriasis and rashes at times. Denies CP/palp/SOB/HA/congestion/fevers/GI or GU c/o. Taking meds as prescribed  Past Medical History:  Diagnosis Date  . Anxiety associated with depression 10/11/2013  . Colon cancer ([place])    right colon cancer- adenocarcinoma CEA level isnrmal at 1.8  . Diabetes mellitus   . Hepatic artery stenosis ([place])   . Hyperlipidemia   . Hypertension   . Hypertriglyceridemia 12/14/2010  . Iron deficiency anemia 2011  . Obesity   . Preventative health care 10/08/2012  . Psoriasis     Past Surgical History:  Procedure Laterality Date  . CARPAL TUNNEL RELEASE     RIGHT  . CHOLECYSTECTOMY  1994  . COLONOSCOPY    . HEMICOLECTOMY     12/17/2009 right  . POLYPECTOMY      Family History  Problem Relation Age of Onset  . Stomach cancer Mother     diedinher 52's  . Diabetes type II Brother     boderline  . Diabetes Brother     type II  . Other Neg Hx     cad,prostate ca, colon ca  . Coronary artery disease Neg Hx   . Cancer Neg Hx     colon, prostate  . Alcohol abuse Brother     Social History   Social History  . Marital status: Single    Spouse name: [name]  . Number of children: 0  . Years of education: N/A   Occupational History  . Chartered certified accountant and fax   Social History Main Topics  . Smoking status: Current Some Day Smoker    Types: Cigarettes  . Smokeless tobacco: Never Used  . Alcohol use No  . Drug use: No  . Sexual activity: Yes     Comment: lives with girlfriend, no dietary restrictions   Other  Topics Concern  . Not on file   Social History Narrative   Occupation: Theatre stage manager and fax)   Single  (lives with [name])   no children    former smoker   Illicit Drug Use - no     Outpatient Medications Prior to Visit  Medication Sig Dispense Refill  . Blood Glucose Monitoring Suppl (ONETOUCH VERIO) W/DEVICE KIT   0  . Clobetasol Prop Emollient Base 0.05 % emollient cream Apply topically daily as needed.      Marland Kitchen CLOBEX SPRAY 0.05 % external spray     . diazepam (VALIUM) 5 MG tablet TAKE 1 TABLET BY MOUTH EVERY 12 HOURS 60 tablet 1  . escitalopram (LEXAPRO) 10 MG tablet TAKE 1 TABLET (10 MG TOTAL) BY MOUTH DAILY. 90 tablet 1  . fenofibrate 160 MG tablet Take 1 tablet (160 mg total) by mouth daily. 90 tablet 2  . glimepiride (AMARYL) 1 MG tablet TAKE 1 TABLET BY MOUTH EVERY DAY BEFORE BREAKFAST 90 tablet 2  . glucose blood (ONETOUCH VERIO) test strip Test three times daily, DX E11.9 300 each 6  . insulin aspart (NOVOLOG FLEXPEN) 100 UNIT/ML FlexPen INJECT  10-16 UNITS UNDER SKIN 3 TIMES A DAY BEFORE MEALS 15 pen 1  . Insulin Glargine (LANTUS SOLOSTAR) 100 UNIT/ML Solostar Pen INJECT 60 UNITS SUBCUTANEOUS EVERY NIGHT AT 10PM **PER INSURANSE 3 BOXES FOR 90 DAYS** 45 pen 3  . Insulin Pen Needle (B-D ULTRAFINE III SHORT PEN) 31G X 8 MM MISC Test as directed three times daily.  DX E11.9 300 each 3  . losartan (COZAAR) 50 MG tablet TAKE 1 TABLET (50 MG TOTAL) BY MOUTH DAILY. 90 tablet 1  . metFORMIN (GLUCOPHAGE) 1000 MG tablet TAKE 1 TABLET (1,000 MG TOTAL) BY MOUTH 2 (TWO) TIMES DAILY WITH A MEAL. 180 tablet 2  . methocarbamol (ROBAXIN) 500 MG tablet TAKE 1 TABLET EVERY 8 HOURSAS NEEDED FOR MUSCLE SPASMS 180 tablet 1  . omeprazole (PRILOSEC) 40 MG capsule TAKE ONE CAPSULE BY MOUTH TWICE A DAY 60 capsule 5  . [place] LANCETS MISC     . oxyCODONE-acetaminophen (ROXICET) 5-325 MG tablet Take 1 tablet by mouth every 8 (eight) hours as needed for moderate pain or severe pain (Do not  drive or operate heavy machinery while taking as can cause drowsiness.). 9 tablet 0  . WELCHOL 625 MG tablet Take 3 tablets (1,875 mg total) by mouth 2 (two) times daily with a meal. 180 tablet 5  . zolpidem (AMBIEN) 10 MG tablet TAKE 1 TABLET BY MOUTH AT BEDTIME AS NEEDED 90 tablet 0  . atorvastatin (LIPITOR) 10 MG tablet TAKE 1 TABLET (10 MG TOTAL) BY MOUTH DAILY. 90 tablet 3  . HUMIRA PEN 40 MG/0.8ML PNKT     . Insulin Detemir (LEVEMIR) 100 UNIT/ML Pen Inject 60 Units into the skin daily at 10 pm. 15 mL 2   No facility-administered medications prior to visit.     No Known Allergies  Review of Systems  Constitutional: Negative for fever and malaise/fatigue.  HENT: Negative for congestion.   Eyes: Negative for blurred vision.  Respiratory: Negative for shortness of breath.   Cardiovascular: Negative for chest pain, palpitations and leg swelling.  Gastrointestinal: Negative for abdominal pain, blood in stool and nausea.  Genitourinary: Negative for dysuria and frequency.  Musculoskeletal: Positive for joint pain. Negative for falls.  Skin: Positive for itching and rash.  Neurological: Negative for dizziness, loss of consciousness and headaches.  Endo/Heme/Allergies: Negative for environmental allergies.  Psychiatric/Behavioral: Negative for depression. The patient is not nervous/anxious.        Objective:    Physical Exam  Constitutional: He is oriented to person, place, and time. He appears well-developed and well-nourished. No distress.  HENT:  Head: Normocephalic and atraumatic.  Eyes: Conjunctivae are normal.  Neck: Neck supple. No thyromegaly present.  Cardiovascular: Normal rate, regular rhythm and normal heart sounds.   No murmur heard. Pulmonary/Chest: Effort normal and breath sounds normal. No respiratory distress. He has no wheezes.  Abdominal: Soft. Bowel sounds are normal. He exhibits no mass. There is no tenderness.  Musculoskeletal: He exhibits no edema.    Lymphadenopathy:    He has no cervical adenopathy.  Neurological: He is alert and oriented to person, place, and time.  Skin: Skin is warm and dry.  Psychiatric: He has a normal mood and affect. His behavior is normal.    BP 124/80   Pulse 78   Temp 98.4 F (36.9 C) (Oral)   Ht '5\' 8"'  (1.727 m)   Wt 244 lb 6 oz (110.8 kg)   SpO2 96%   BMI 37.16 kg/m  Wt Readings from Last 3  Encounters:  01/29/16 244 lb 6 oz (110.8 kg)  12/05/15 237 lb (107.5 kg)  11/06/15 237 lb 2 oz (107.6 kg)     Lab Results  Component Value Date   WBC 9.1 01/29/2016   HGB 13.8 01/29/2016   HCT 40.3 01/29/2016   PLT 270.0 01/29/2016   GLUCOSE 133 (H) 01/29/2016   CHOL 158 01/29/2016   TRIG 311.0 (H) 01/29/2016   HDL 30.90 (L) 01/29/2016   LDLDIRECT 67.0 01/29/2016   LDLCALC SEE COMMENT 10/27/2014   ALT 60 (H) 01/29/2016   AST 40 (H) 01/29/2016   NA 137 01/29/2016   K 4.1 01/29/2016   CL 104 01/29/2016   CREATININE 0.90 01/29/2016   BUN 14 01/29/2016   CO2 22 01/29/2016   TSH 1.61 01/29/2016   PSA 0.12 01/08/2013   INR 0.9 10/27/2014   HGBA1C 7.7 (H) 01/29/2016   MICROALBUR 0.50 01/08/2013    Lab Results  Component Value Date   TSH 1.61 01/29/2016   Lab Results  Component Value Date   WBC 9.1 01/29/2016   HGB 13.8 01/29/2016   HCT 40.3 01/29/2016   MCV 90.8 01/29/2016   PLT 270.0 01/29/2016   Lab Results  Component Value Date   NA 137 01/29/2016   K 4.1 01/29/2016   CO2 22 01/29/2016   GLUCOSE 133 (H) 01/29/2016   BUN 14 01/29/2016   CREATININE 0.90 01/29/2016   BILITOT 1.1 01/29/2016   ALKPHOS 51 01/29/2016   AST 40 (H) 01/29/2016   ALT 60 (H) 01/29/2016   PROT 7.2 01/29/2016   ALBUMIN 4.0 01/29/2016   CALCIUM 9.3 01/29/2016   ANIONGAP 4 (L) 10/30/2014   GFR 93.73 01/29/2016   Lab Results  Component Value Date   CHOL 158 01/29/2016   Lab Results  Component Value Date   HDL 30.90 (L) 01/29/2016   Lab Results  Component Value Date   LDLCALC SEE COMMENT  10/27/2014   Lab Results  Component Value Date   TRIG 311.0 (H) 01/29/2016   Lab Results  Component Value Date   CHOLHDL 5 01/29/2016   Lab Results  Component Value Date   HGBA1C 7.7 (H) 01/29/2016       Assessment & Plan:   Problem List Items Addressed This Visit    Obesity   Relevant Orders   TSH (Completed)   CBC (Completed)   Hemoglobin A1c (Completed)   Lipid panel (Completed)   Comprehensive metabolic panel (Completed)   Anemia   Relevant Orders   TSH (Completed)   CBC (Completed)   Hemoglobin A1c (Completed)   Lipid panel (Completed)   Comprehensive metabolic panel (Completed)   GERD    Avoid offending foods, start probiotics. Do not eat large meals in late evening and consider raising head of bed.       Psoriasis    Follows with Dr Sarina Ser of dermatology. He had a previous dermatologist Dr Marko Stai but has changed. They have stopped Humira and have switched him to White      Diabetes mellitus type 2 in obese ([place])    hgba1c acceptable, improved significantly, still slightly elevated. minimize simple carbs. Increase exercise as tolerated. Continue current meds      Relevant Orders   TSH (Completed)   CBC (Completed)   Hemoglobin A1c (Completed)   Lipid panel (Completed)   Comprehensive metabolic panel (Completed)   Hyperlipidemia, mixed    Tolerating statin, encouraged heart healthy diet, avoid trans fats, minimize simple carbs and saturated fats.  Increase exercise as tolerated      Relevant Medications   metoprolol succinate (TOPROL XL) 25 MG 24 hr tablet   Other Relevant Orders   TSH (Completed)   CBC (Completed)   Hemoglobin A1c (Completed)   Lipid panel (Completed)   Comprehensive metabolic panel (Completed)   Hypertriglyceridemia   Relevant Medications   metoprolol succinate (TOPROL XL) 25 MG 24 hr tablet   Other Relevant Orders   TSH (Completed)   CBC (Completed)   Hemoglobin A1c (Completed)   Lipid panel (Completed)    Comprehensive metabolic panel (Completed)   Essential hypertension    Well controlled, no changes to meds. Encouraged heart healthy diet such as the DASH diet and exercise as tolerated.       Relevant Medications   metoprolol succinate (TOPROL XL) 25 MG 24 hr tablet   Other Relevant Orders   TSH (Completed)   CBC (Completed)   Hemoglobin A1c (Completed)   Lipid panel (Completed)   Comprehensive metabolic panel (Completed)   Palpitations - Primary    Happens mostly at bedtime. Does not awaken him. Is vigorous to the point that he cannot fall asleep. Does not occur during the day. He is to minimize caffeine and if symptoms worsen will need referral      Relevant Orders   TSH (Completed)   CBC (Completed)   Hemoglobin A1c (Completed)   Lipid panel (Completed)   Comprehensive metabolic panel (Completed)    Other Visit Diagnoses   None.     I have discontinued Mr. Fickle HUMIRA PEN. I am also having him start on metoprolol succinate. Additionally, I am having him maintain his Clobetasol Prop Emollient Base, ONETOUCH [place]ICA LANCETS, CLOBEX SPRAY, ONETOUCH VERIO, WELCHOL, glucose blood, Insulin Pen Needle, fenofibrate, glimepiride, omeprazole, Insulin Glargine, methocarbamol, zolpidem, metFORMIN, losartan, insulin aspart, oxyCODONE-acetaminophen, escitalopram, diazepam, and ustekinumab.  Meds ordered this encounter  Medications  . ustekinumab (STELARA) 90 MG/ML SOSY injection    Sig: Inject 1 mL (90 mg total) into the skin every 3 (three) months.  . metoprolol succinate (TOPROL XL) 25 MG 24 hr tablet    Sig: Take 1 tablet (25 mg total) by mouth daily.    Dispense:  90 tablet    Refill:  1     Penni Homans, MD

## 2016-02-17 ENCOUNTER — Ambulatory Visit: Payer: BLUE CROSS/BLUE SHIELD | Admitting: Family Medicine

## 2016-03-02 ENCOUNTER — Other Ambulatory Visit: Payer: Self-pay | Admitting: Family Medicine

## 2016-03-02 NOTE — Telephone Encounter (Signed)
Last refill on 08/11/2015  #90 with 0 refills Last office visit 01/29/2016

## 2016-03-04 NOTE — Telephone Encounter (Signed)
Fax[facility] hardcopy for [facility] to [facility] in [place]

## 2016-03-16 ENCOUNTER — Other Ambulatory Visit: Payer: Self-pay | Admitting: Family Medicine

## 2016-03-16 DIAGNOSIS — E782 Mixed hyperlipidemia: Secondary | ICD-10-CM

## 2016-03-16 DIAGNOSIS — I10 Essential (primary) hypertension: Secondary | ICD-10-CM

## 2016-03-16 DIAGNOSIS — E669 Obesity, unspecified: Secondary | ICD-10-CM

## 2016-03-16 DIAGNOSIS — R002 Palpitations: Secondary | ICD-10-CM

## 2016-03-16 DIAGNOSIS — K859 Acute pancreatitis, unspecified: Secondary | ICD-10-CM

## 2016-03-16 DIAGNOSIS — E1169 Type 2 diabetes mellitus with other specified complication: Secondary | ICD-10-CM

## 2016-04-28 ENCOUNTER — Encounter: Payer: Self-pay | Admitting: Internal Medicine

## 2016-04-29 ENCOUNTER — Ambulatory Visit: Payer: BLUE CROSS/BLUE SHIELD | Admitting: Family Medicine

## 2016-04-29 DIAGNOSIS — E119 Type 2 diabetes mellitus without complications: Secondary | ICD-10-CM | POA: Diagnosis not present

## 2016-04-29 DIAGNOSIS — L400 Psoriasis vulgaris: Secondary | ICD-10-CM | POA: Diagnosis not present

## 2016-05-11 ENCOUNTER — Other Ambulatory Visit: Payer: Self-pay | Admitting: Family Medicine

## 2016-05-12 NOTE — Telephone Encounter (Signed)
Last seen 01/29/16 Last filled #[phone] 1 rf Please advise PC

## 2016-05-13 NOTE — Telephone Encounter (Signed)
Faxed hardcopy for Valium to [facility] in [place]

## 2016-06-22 ENCOUNTER — Encounter: Payer: Self-pay | Admitting: Family Medicine

## 2016-06-22 ENCOUNTER — Ambulatory Visit (INDEPENDENT_AMBULATORY_CARE_PROVIDER_SITE_OTHER): Payer: BLUE CROSS/BLUE SHIELD | Admitting: Family Medicine

## 2016-06-22 DIAGNOSIS — E1169 Type 2 diabetes mellitus with other specified complication: Secondary | ICD-10-CM

## 2016-06-22 DIAGNOSIS — L409 Psoriasis, unspecified: Secondary | ICD-10-CM

## 2016-06-22 DIAGNOSIS — I10 Essential (primary) hypertension: Secondary | ICD-10-CM

## 2016-06-22 DIAGNOSIS — E669 Obesity, unspecified: Secondary | ICD-10-CM

## 2016-06-22 DIAGNOSIS — D649 Anemia, unspecified: Secondary | ICD-10-CM

## 2016-06-22 DIAGNOSIS — Z23 Encounter for immunization: Secondary | ICD-10-CM

## 2016-06-22 DIAGNOSIS — E349 Endocrine disorder, unspecified: Secondary | ICD-10-CM | POA: Insufficient documentation

## 2016-06-22 DIAGNOSIS — F418 Other specified anxiety disorders: Secondary | ICD-10-CM

## 2016-06-22 DIAGNOSIS — R7989 Other specified abnormal findings of blood chemistry: Secondary | ICD-10-CM

## 2016-06-22 DIAGNOSIS — N529 Male erectile dysfunction, unspecified: Secondary | ICD-10-CM | POA: Insufficient documentation

## 2016-06-22 HISTORY — DX: Other specified abnormal findings of blood chemistry: R79.89

## 2016-06-22 HISTORY — DX: Male erectile dysfunction, unspecified: N52.9

## 2016-06-22 HISTORY — DX: Endocrine disorder, unspecified: E34.9

## 2016-06-22 MED ORDER — SILDENAFIL CITRATE 20 MG PO TABS: ORAL_TABLET | ORAL | 1 refills | Status: DC

## 2016-06-22 NOTE — Assessment & Plan Note (Addendum)
hgba1c acceptable at last draw, minimize simple carbs. Increase exercise as tolerated. Continue current meds til labs are done later this week.

## 2016-06-22 NOTE — Assessment & Plan Note (Signed)
Doing much better on Stelara

## 2016-06-22 NOTE — Assessment & Plan Note (Signed)
Struggles with high anxiety and increased agitation. Is considering meds would consider Wellbutrin if patient agrees but he declines today.

## 2016-06-22 NOTE — Assessment & Plan Note (Signed)
Well controlled, no changes to meds. Encouraged heart healthy diet such as the DASH diet and exercise as tolerated.  °

## 2016-06-22 NOTE — Progress Notes (Signed)
Pre visit review using our clinic review tool, if applicable. No additional management support is needed unless otherwise documented below in the visit note. 

## 2016-06-22 NOTE — Assessment & Plan Note (Signed)
Will try Revatio 20 to 100 mg daily prn as needed

## 2016-06-22 NOTE — Assessment & Plan Note (Signed)
Will check a level with labs this

## 2016-06-22 NOTE — Patient Instructions (Signed)
Carbohydrate Counting for Diabetes Mellitus, Adult Carbohydrate counting is a method for keeping track of how many carbohydrates you eat. Eating carbohydrates naturally increases the amount of sugar (glucose) in the blood. Counting how many carbohydrates you eat helps keep your blood glucose within normal limits, which helps you manage your diabetes (diabetes mellitus). It is important to know how many carbohydrates you can safely have in each meal. This is different for every person. A diet and nutrition specialist (registered dietitian) can help you make a meal plan and calculate how many carbohydrates you should have at each meal and snack. Carbohydrates are found in the following foods:  Grains, such as breads and cereals.  Dried beans and soy products.  Starchy vegetables, such as potatoes, peas, and corn.  Fruit and fruit juices.  Milk and yogurt.  Sweets and snack foods, such as cake, cookies, candy, chips, and soft drinks. How do I count carbohydrates? There are two ways to count carbohydrates in food. You can use either of the methods or a combination of both. Reading "Nutrition Facts" on packaged food  The "Nutrition Facts" list is included on the labels of almost all packaged foods and beverages in the [place] It includes:  The serving size.  Information about nutrients in each serving, including the grams (g) of carbohydrate per serving. To use the "Nutrition Facts":  Decide how many servings you will have.  Multiply the number of servings by the number of carbohydrates per serving.  The resulting number is the total amount of carbohydrates that you will be having. Learning standard serving sizes of other foods  When you eat foods containing carbohydrates that are not packaged or do not include "Nutrition Facts" on the label, you need to measure the servings in order to count the amount of carbohydrates:  Measure the foods that you will eat with a food scale or measuring  cup, if needed.  Decide how many standard-size servings you will eat.  Multiply the number of servings by 15. Most carbohydrate-rich foods have about 15 g of carbohydrates per serving.  For example, if you eat 8 oz (170 g) of strawberries, you will have eaten 2 servings and 30 g of carbohydrates (2 servings x 15 g = 30 g).  For foods that have more than one food mixed, such as soups and casseroles, you must count the carbohydrates in each food that is included. The following list contains standard serving sizes of common carbohydrate-rich foods. Each of these servings has about 15 g of carbohydrates:   hamburger bun or  English muffin.   oz (15 mL) syrup.   oz (14 g) jelly.  1 slice of bread.  1 six-inch tortilla.  3 oz (85 g) cooked rice or pasta.  4 oz (113 g) cooked dried beans.  4 oz (113 g) starchy vegetable, such as peas, corn, or potatoes.  4 oz (113 g) hot cereal.  4 oz (113 g) mashed potatoes or  of a large baked potato.  4 oz (113 g) canned or frozen fruit.  4 oz (120 mL) fruit juice.  4-6 crackers.  6 chicken nuggets.  6 oz (170 g) unsweetened dry cereal.  6 oz (170 g) plain fat-free yogurt or yogurt sweetened with artificial sweeteners.  8 oz (240 mL) milk.  8 oz (170 g) fresh fruit or one small piece of fruit.  24 oz (680 g) popped popcorn. Example of carbohydrate counting Sample meal  3 oz (85 g) chicken breast.  6 oz (  170 g) brown rice.  4 oz (113 g) corn.  8 oz (240 mL) milk.  8 oz (170 g) strawberries with sugar-free whipped topping. Carbohydrate calculation 1. Identify the foods that contain carbohydrates:  Rice.  Corn.  Milk.  Strawberries. 2. Calculate how many servings you have of each food:  2 servings rice.  1 serving corn.  1 serving milk.  1 serving strawberries. 3. Multiply each number of servings by 15 g:  2 servings rice x 15 g = 30 g.  1 serving corn x 15 g = 15 g.  1 serving milk x 15 g = 15  g.  1 serving strawberries x 15 g = 15 g. 4. Add together all of the amounts to find the total grams of carbohydrates eaten:  30 g + 15 g + 15 g + 15 g = 75 g of carbohydrates total. This information is not intended to replace advice given to you by your health care provider. Make sure you discuss any questions you have with your health care provider. Document Released: 07/05/2005 Document Revised: 01/23/2016 Document Reviewed: 12/17/2015 Elsevier Interactive Patient Education  2017 [employer]  

## 2016-06-22 NOTE — Progress Notes (Signed)
Subjective:    Patient ID: [name], male    DOB: 13-May-1961, 55 y.o.   MRN: [id number]  Chief Complaint  Patient presents with  . Follow-up    HPI Patient is in today for follow up. He struggles with chronic fatigue and this is persistent. He notes his psoriasis is better on current med and continues to follow with dermatology. He has not been taking his medications as prescribed. Notes polyuria but no polydipsia. Denies CP/palp/SOB/HA/congestion/fevers/GI c/o. Taking meds as prescribed  Past Medical History:  Diagnosis Date  . Anxiety associated with depression 10/11/2013  . Colon cancer ([place])    right colon cancer- adenocarcinoma CEA level isnrmal at 1.8  . Diabetes mellitus   . Erectile dysfunction 06/22/2016  . Hepatic artery stenosis ([place])   . Hyperlipidemia   . Hypertension   . Hypertriglyceridemia 12/14/2010  . Iron deficiency anemia 2011  . Low testosterone 06/22/2016  . Obesity   . Preventative health care 10/08/2012  . Psoriasis     Past Surgical History:  Procedure Laterality Date  . CARPAL TUNNEL RELEASE     RIGHT  . CHOLECYSTECTOMY  1994  . COLONOSCOPY    . HEMICOLECTOMY     12/17/2009 right  . POLYPECTOMY      Family History  Problem Relation Age of Onset  . Stomach cancer Mother     diedinher 83's  . Diabetes type II Brother     boderline  . Diabetes Brother     type II  . Alcohol abuse Brother   . Other Neg Hx     cad,prostate ca, colon ca  . Coronary artery disease Neg Hx   . Cancer Neg Hx     colon, prostate    Social History   Social History  . Marital status: Single    Spouse name: [name]  . Number of children: 0  . Years of education: N/A   Occupational History  . Chartered certified accountant and fax   Social History Main Topics  . Smoking status: Current Some Day Smoker    Types: Cigarettes  . Smokeless tobacco: Never Used  . Alcohol use No  . Drug use: No  . Sexual activity: Yes     Comment: lives with girlfriend, no  dietary restrictions   Other Topics Concern  . Not on file   Social History Narrative   Occupation: Theatre stage manager and fax)   Single  (lives with [name])   no children    former smoker   Illicit Drug Use - no     Outpatient Medications Prior to Visit  Medication Sig Dispense Refill  . Blood Glucose Monitoring Suppl (ONETOUCH VERIO) W/DEVICE KIT   0  . Clobetasol Prop Emollient Base 0.05 % emollient cream Apply topically daily as needed.      Marland Kitchen CLOBEX SPRAY 0.05 % external spray     . diazepam (VALIUM) 5 MG tablet TAKE 1 TABLET BY MOUTH EVERY 12 HOURS 60 tablet 1  . glimepiride (AMARYL) 1 MG tablet TAKE 1 TABLET BY MOUTH EVERY DAY BEFORE BREAKFAST 90 tablet 2  . glucose blood (ONETOUCH VERIO) test strip Test three times daily, DX E11.9 300 each 6  . insulin aspart (NOVOLOG FLEXPEN) 100 UNIT/ML FlexPen INJECT 10-16 UNITS UNDER SKIN 3 TIMES A DAY BEFORE MEALS 15 pen 1  . Insulin Glargine (LANTUS SOLOSTAR) 100 UNIT/ML Solostar Pen INJECT 60 UNITS SUBCUTANEOUS EVERY NIGHT AT 10PM **PER INSURANSE 3 BOXES FOR 90 DAYS** 45  pen 3  . Insulin Pen Needle (B-D ULTRAFINE III SHORT PEN) 31G X 8 MM MISC Test as directed three times daily.  DX E11.9 300 each 3  . lisinopril (PRINIVIL,ZESTRIL) 5 MG tablet TAKE 1 TABLET (5 MG TOTAL) BY MOUTH DAILY. 90 tablet 2  . metFORMIN (GLUCOPHAGE) 1000 MG tablet TAKE 1 TABLET (1,000 MG TOTAL) BY MOUTH 2 (TWO) TIMES DAILY WITH A MEAL. 180 tablet 2  . methocarbamol (ROBAXIN) 500 MG tablet TAKE 1 TABLET EVERY 8 HOURSAS NEEDED FOR MUSCLE SPASMS 180 tablet 1  . metoprolol succinate (TOPROL XL) 25 MG 24 hr tablet Take 1 tablet (25 mg total) by mouth daily. 90 tablet 1  . [place] LANCETS MISC     . oxyCODONE-acetaminophen (ROXICET) 5-325 MG tablet Take 1 tablet by mouth every 8 (eight) hours as needed for moderate pain or severe pain (Do not drive or operate heavy machinery while taking as can cause drowsiness.). 9 tablet 0  . ustekinumab (STELARA) 90 MG/ML SOSY  injection Inject 1 mL (90 mg total) into the skin every 3 (three) months.    . zolpidem (AMBIEN) 10 MG tablet TAKE 1 TABLET BY MOUTH AT BEDTIME AS NEEDED 90 tablet 1  . atorvastatin (LIPITOR) 20 MG tablet Take 1 tablet (20 mg total) by mouth daily. 30 tablet 3  . escitalopram (LEXAPRO) 10 MG tablet TAKE 1 TABLET (10 MG TOTAL) BY MOUTH DAILY. 90 tablet 1  . fenofibrate 160 MG tablet Take 1 tablet (160 mg total) by mouth daily. 90 tablet 2  . Insulin Detemir (LEVEMIR) 100 UNIT/ML Pen Inject 60 Units into the skin daily at 10 pm. 15 mL 3  . losartan (COZAAR) 50 MG tablet TAKE 1 TABLET (50 MG TOTAL) BY MOUTH DAILY. 90 tablet 1  . omeprazole (PRILOSEC) 40 MG capsule TAKE ONE CAPSULE BY MOUTH TWICE A DAY 60 capsule 5  . WELCHOL 625 MG tablet Take 3 tablets (1,875 mg total) by mouth 2 (two) times daily with a meal. 180 tablet 5   No facility-administered medications prior to visit.     No Known Allergies  Review of Systems  Constitutional: Positive for malaise/fatigue. Negative for fever.  Eyes: Negative for blurred vision.  Respiratory: Negative for cough and shortness of breath.   Cardiovascular: Negative for chest pain and palpitations.  Gastrointestinal: Negative for vomiting.  Musculoskeletal: Negative for back pain.  Skin: Positive for rash.  Neurological: Negative for loss of consciousness and headaches.       Objective:    Physical Exam  Constitutional: He is oriented to person, place, and time. He appears well-developed and well-nourished. No distress.  HENT:  Head: Normocephalic and atraumatic.  Nose: Nose normal.  Eyes: Conjunctivae are normal. Right eye exhibits no discharge. Left eye exhibits no discharge.  Neck: Normal range of motion. Neck supple. No thyromegaly present.  Cardiovascular: Normal rate and regular rhythm.   No murmur heard. Pulmonary/Chest: Effort normal and breath sounds normal. He has no wheezes.  Abdominal: Soft. Bowel sounds are normal. There is no  tenderness.  Musculoskeletal: Normal range of motion. He exhibits no edema or deformity.  Neurological: He is alert and oriented to person, place, and time.  Skin: Skin is warm and dry. He is not diaphoretic.  Psychiatric: He has a normal mood and affect.  Nursing note and vitals reviewed.   BP 116/66 (BP Location: Left Arm, Patient Position: Sitting, Cuff Size: Normal)   Pulse 90   Temp 98.6 F (37 C) (Oral)  Wt 235 lb 6.4 oz (106.8 kg)   SpO2 95%   BMI 35.79 kg/m  Wt Readings from Last 3 Encounters:  06/22/16 235 lb 6.4 oz (106.8 kg)  01/29/16 244 lb 6 oz (110.8 kg)  12/05/15 237 lb (107.5 kg)     Lab Results  Component Value Date   WBC 10.8 (H) 06/25/2016   HGB 13.9 06/25/2016   HCT 40.6 06/25/2016   PLT 322.0 06/25/2016   GLUCOSE 190 (H) 06/25/2016   CHOL 139 06/25/2016   TRIG (H) 06/25/2016    407.0 Triglyceride is over 400; calculations on Lipids are invalid.   HDL 30.00 (L) 06/25/2016   LDLDIRECT 47.0 06/25/2016   LDLCALC SEE COMMENT 10/27/2014   ALT 22 06/25/2016   AST 17 06/25/2016   NA 139 06/25/2016   K 4.1 06/25/2016   CL 104 06/25/2016   CREATININE 0.76 06/25/2016   BUN 9 06/25/2016   CO2 29 06/25/2016   TSH 2.35 06/25/2016   PSA 0.18 06/25/2016   INR 0.9 10/27/2014   HGBA1C 8.9 (H) 06/25/2016   MICROALBUR 0.50 01/08/2013    Lab Results  Component Value Date   TSH 2.35 06/25/2016   Lab Results  Component Value Date   WBC 10.8 (H) 06/25/2016   HGB 13.9 06/25/2016   HCT 40.6 06/25/2016   MCV 90.2 06/25/2016   PLT 322.0 06/25/2016   Lab Results  Component Value Date   NA 139 06/25/2016   K 4.1 06/25/2016   CO2 29 06/25/2016   GLUCOSE 190 (H) 06/25/2016   BUN 9 06/25/2016   CREATININE 0.76 06/25/2016   BILITOT 0.8 06/25/2016   ALKPHOS 76 06/25/2016   AST 17 06/25/2016   ALT 22 06/25/2016   PROT 6.4 06/25/2016   ALBUMIN 3.7 06/25/2016   CALCIUM 9.2 06/25/2016   ANIONGAP 4 (L) 10/30/2014   GFR 113.75 06/25/2016   Lab Results    Component Value Date   CHOL 139 06/25/2016   Lab Results  Component Value Date   HDL 30.00 (L) 06/25/2016   Lab Results  Component Value Date   LDLCALC SEE COMMENT 10/27/2014   Lab Results  Component Value Date   TRIG (H) 06/25/2016    407.0 Triglyceride is over 400; calculations on Lipids are invalid.   Lab Results  Component Value Date   CHOLHDL 5 06/25/2016   Lab Results  Component Value Date   HGBA1C 8.9 (H) 06/25/2016       Assessment & Plan:   Problem List Items Addressed This Visit    Anemia    Increase leafy greens, consider increased lean red meat and using cast iron cookware. Continue to monitor, report any concerns      Psoriasis    Doing much better on Stelara      Diabetes mellitus type 2 in obese ([place])    hgba1c acceptable at last draw, minimize simple carbs. Increase exercise as tolerated. Continue current meds til labs are done later this week.       Relevant Orders   Hemoglobin A1c (Completed)   Lipid panel (Completed)   TSH (Completed)   Anxiety associated with depression    Struggles with high anxiety and increased agitation. Is considering meds would consider Wellbutrin if patient agrees but he declines today.      Essential hypertension    Well controlled, no changes to meds. Encouraged heart healthy diet such as the DASH diet and exercise as tolerated.       Relevant Orders  CBC (Completed)   Comprehensive metabolic panel (Completed)   Lipid panel (Completed)   TSH (Completed)   Low testosterone    Will check a level with labs this       Relevant Orders   Testosterone (Completed)   Erectile dysfunction    Will try Revatio 20 to 100 mg daily prn as needed      Relevant Orders   PSA (Completed)      I am having Mr. Sellen maintain his Clobetasol Prop Emollient Base, ONETOUCH DELICA LANCETS, CLOBEX SPRAY, ONETOUCH VERIO, glucose blood, Insulin Pen Needle, Insulin Glargine, methocarbamol, metFORMIN, insulin aspart,  oxyCODONE-acetaminophen, ustekinumab, metoprolol succinate, zolpidem, glimepiride, lisinopril, and diazepam.  Meds ordered this encounter  Medications  . DISCONTD: sildenafil (REVATIO) 20 MG tablet    Sig: 1-5 tabs po daily ED    Dispense:  90 tablet    Refill:  1   CMA served as scribe during this visit. Interview, physical exam and plan performed by me. Documentation reviewed and attested to.  Penni Homans, MD Patient ID: Serafino Burciaga, male   DOB: 03/16/1961, 55 y.o.   MRN: [id number]

## 2016-06-22 NOTE — Assessment & Plan Note (Signed)
Increase leafy greens, consider increased lean red meat and using cast iron cookware. Continue to monitor, report any concerns 

## 2016-06-23 ENCOUNTER — Encounter: Payer: Self-pay | Admitting: Family Medicine

## 2016-06-23 ENCOUNTER — Other Ambulatory Visit: Payer: Self-pay | Admitting: Family Medicine

## 2016-06-23 NOTE — Telephone Encounter (Signed)
Mr. Wiederkehr, Dr. Charlett Blake is out of the office today, but I am forwarding your request to her, so that she may see it in a timely manner and get a response back to you regarding this request. Thank You for choosing [facility] ! Best Regards,  Rockwell [place], CMA AAMA

## 2016-06-23 NOTE — Telephone Encounter (Signed)
Refill sent per [facility] refill protocol/SLS  

## 2016-06-24 ENCOUNTER — Other Ambulatory Visit: Payer: Self-pay

## 2016-06-24 MED ORDER — SILDENAFIL CITRATE 20 MG PO TABS: ORAL_TABLET | ORAL | 1 refills | Status: DC

## 2016-06-24 NOTE — Telephone Encounter (Signed)
Medication has been reprinted signed by pcp and waiting patient to pick up in the front office.  PC

## 2016-06-25 ENCOUNTER — Other Ambulatory Visit (INDEPENDENT_AMBULATORY_CARE_PROVIDER_SITE_OTHER): Payer: BLUE CROSS/BLUE SHIELD

## 2016-06-25 DIAGNOSIS — I10 Essential (primary) hypertension: Secondary | ICD-10-CM

## 2016-06-25 DIAGNOSIS — N529 Male erectile dysfunction, unspecified: Secondary | ICD-10-CM | POA: Diagnosis not present

## 2016-06-25 DIAGNOSIS — E1169 Type 2 diabetes mellitus with other specified complication: Secondary | ICD-10-CM | POA: Diagnosis not present

## 2016-06-25 DIAGNOSIS — E669 Obesity, unspecified: Secondary | ICD-10-CM | POA: Diagnosis not present

## 2016-06-25 DIAGNOSIS — R7989 Other specified abnormal findings of blood chemistry: Secondary | ICD-10-CM | POA: Diagnosis not present

## 2016-06-25 DIAGNOSIS — E349 Endocrine disorder, unspecified: Secondary | ICD-10-CM

## 2016-06-25 LAB — COMPREHENSIVE METABOLIC PANEL
ALBUMIN: 3.7 g/dL (ref 3.5–5.2)
ALT: 22 U/L (ref 0–53)
AST: 17 U/L (ref 0–37)
Alkaline Phosphatase: 76 U/L (ref 39–117)
BILIRUBIN TOTAL: 0.8 mg/dL (ref 0.2–1.2)
BUN: 9 mg/dL (ref 6–23)
CALCIUM: 9.2 mg/dL (ref 8.4–10.5)
CHLORIDE: 104 meq/L (ref 96–112)
CO2: 29 meq/L (ref 19–32)
CREATININE: 0.76 mg/dL (ref 0.40–1.50)
GFR: 113.75 mL/min (ref >60.00)
Glucose, Bld: 190 mg/dL — ABNORMAL HIGH (ref 70–99)
Potassium: 4.1 mEq/L (ref 3.5–5.1)
Sodium: 139 mEq/L (ref 135–145)
Total Protein: 6.4 g/dL (ref 6.0–8.3)

## 2016-06-25 LAB — LIPID PANEL
CHOLESTEROL: 139 mg/dL (ref 0–200)
HDL: 30 mg/dL — AB (ref >39.00)
Total CHOL/HDL Ratio: 5

## 2016-06-25 LAB — CBC
HCT: 40.6 % (ref 39.0–52.0)
HEMOGLOBIN: 13.9 g/dL (ref 13.0–17.0)
MCHC: 34.4 g/dL (ref 30.0–36.0)
MCV: 90.2 fl (ref 78.0–100.0)
PLATELETS: 322 10*3/uL (ref 150.0–400.0)
RBC: 4.5 Mil/uL (ref 4.22–5.81)
RDW: 11.9 % (ref 11.5–15.5)
WBC: 10.8 10*3/uL — ABNORMAL HIGH (ref 4.0–10.5)

## 2016-06-25 LAB — LDL CHOLESTEROL, DIRECT: Direct LDL: 47 mg/dL

## 2016-06-25 LAB — TESTOSTERONE: Testosterone: 197.85 ng/dL — ABNORMAL LOW (ref 300.00–890.00)

## 2016-06-25 LAB — HEMOGLOBIN A1C: Hgb A1c MFr Bld: 8.9 % — ABNORMAL HIGH (ref 4.6–6.5)

## 2016-06-25 LAB — PSA: PSA: 0.18 ng/mL (ref 0.10–4.00)

## 2016-06-25 LAB — TSH: TSH: 2.35 u[IU]/mL (ref 0.35–4.50)

## 2016-06-28 ENCOUNTER — Other Ambulatory Visit: Payer: Self-pay | Admitting: Family Medicine

## 2016-06-28 ENCOUNTER — Telehealth: Payer: Self-pay | Admitting: Family Medicine

## 2016-06-28 ENCOUNTER — Encounter: Payer: Self-pay | Admitting: Family Medicine

## 2016-06-28 MED ORDER — OMEPRAZOLE 40 MG PO CPDR: 40.0000 mg | DELAYED_RELEASE_CAPSULE | Freq: Two times a day (BID) | ORAL | 3 refills | Status: DC

## 2016-06-28 MED ORDER — ATORVASTATIN CALCIUM 20 MG PO TABS: 20.0000 mg | ORAL_TABLET | Freq: Every day | ORAL | 3 refills | Status: DC

## 2016-06-28 MED ORDER — FENOFIBRATE 160 MG PO TABS: 160.0000 mg | ORAL_TABLET | Freq: Every day | ORAL | 3 refills | Status: DC

## 2016-06-28 NOTE — Telephone Encounter (Signed)
12/26/15 R PPPS, SUBSEQ VISIT [id number] unable to lvm advising patient to schedule medicare wellness appointment.

## 2016-06-29 ENCOUNTER — Other Ambulatory Visit: Payer: Self-pay | Admitting: Family Medicine

## 2016-08-03 DIAGNOSIS — L853 Xerosis cutis: Secondary | ICD-10-CM | POA: Diagnosis not present

## 2016-08-03 DIAGNOSIS — L400 Psoriasis vulgaris: Secondary | ICD-10-CM | POA: Diagnosis not present

## 2016-08-11 ENCOUNTER — Other Ambulatory Visit: Payer: Self-pay | Admitting: Family Medicine

## 2016-08-12 NOTE — Telephone Encounter (Signed)
OK to refill all requested meds but let him know the new regulations require a drug contract and UDS

## 2016-08-12 NOTE — Telephone Encounter (Signed)
Requesting:   Diazepam Contract   NONE UDS   NONE Last OV   06/22/2016--NEXT SCHEDULED OFFICE VISIT IS 09/30/2016 Last Refill     #60 with 1 refill on 05/12/2016  Please Advise

## 2016-08-13 ENCOUNTER — Other Ambulatory Visit: Payer: Self-pay | Admitting: Family Medicine

## 2016-08-13 ENCOUNTER — Encounter: Payer: Self-pay | Admitting: Family Medicine

## 2016-08-13 DIAGNOSIS — Z79899 Other long term (current) drug therapy: Secondary | ICD-10-CM | POA: Diagnosis not present

## 2016-08-13 MED ORDER — DIAZEPAM 5 MG PO TABS: 5.0000 mg | ORAL_TABLET | Freq: Two times a day (BID) | ORAL | 1 refills | Status: DC

## 2016-08-13 MED ORDER — INSULIN DETEMIR 100 UNIT/ML FLEXPEN: 60.0000 [IU] | PEN_INJECTOR | Freq: Every day | SUBCUTANEOUS | 3 refills | Status: DC

## 2016-08-13 MED ORDER — DIAZEPAM 5 MG PO TABS: 5.0000 mg | ORAL_TABLET | Freq: Two times a day (BID) | ORAL | 1 refills | Status: DC | PRN

## 2016-08-13 MED ORDER — ZOLPIDEM TARTRATE 10 MG PO TABS: 10.0000 mg | ORAL_TABLET | Freq: Every evening | ORAL | 1 refills | Status: DC | PRN

## 2016-08-13 NOTE — Telephone Encounter (Signed)
Patient returned diazepam prescription as previous one printed has a fill date of 06/2016 on it. Removed that fill date and refilled and PCP signed- patient was given at the front desk.

## 2016-08-13 NOTE — Telephone Encounter (Signed)
Called the patient informed of contact/uds/pickup diazepam Patient also needs zolpidem--last refilled 03/02/2016   #90 with 1 refill He wants to pickup the same times as the diazepam. thanks

## 2016-08-13 NOTE — Telephone Encounter (Signed)
Printed valium per PCP instructions from a mychart request and printed a contract. Patient contract by [employer] to pickup valium/sign contract.

## 2016-08-13 NOTE — Telephone Encounter (Signed)
PCP did ok to refill the zolpidem He will pickup both the zolpidem and valium/do UDS/contract as well.

## 2016-08-13 NOTE — Addendum Note (Signed)
Addended by: Sharon Seller B on: 08/13/2016 07:48 AM   Modules accepted: Orders

## 2016-08-13 NOTE — Addendum Note (Signed)
Addended by: Sharon Seller B on: 08/13/2016 11:54 AM   Modules accepted: Orders

## 2016-08-17 ENCOUNTER — Telehealth: Payer: Self-pay | Admitting: Family Medicine

## 2016-08-17 MED ORDER — INSULIN ASPART 100 UNIT/ML FLEXPEN: PEN_INJECTOR | SUBCUTANEOUS | 3 refills | Status: DC

## 2016-08-17 NOTE — Telephone Encounter (Signed)
Pharmacy called stating that the patient would like a 3 month supply of insulin aspart (NOVOLOG FLEXPEN) 100 UNIT/ML FlexPen  Please advise  Pharmacy: [id number] - , [place]  Pharmacy phone: [phone]

## 2016-08-17 NOTE — Telephone Encounter (Signed)
Refill done to pharmacy requested.

## 2016-09-30 ENCOUNTER — Ambulatory Visit (INDEPENDENT_AMBULATORY_CARE_PROVIDER_SITE_OTHER): Payer: BLUE CROSS/BLUE SHIELD | Admitting: Family Medicine

## 2016-09-30 ENCOUNTER — Encounter: Payer: Self-pay | Admitting: Family Medicine

## 2016-09-30 VITALS — BP 128/68 | HR 99 | Temp 98.2°F | Resp 18 | Wt 244.8 lb

## 2016-09-30 DIAGNOSIS — E349 Endocrine disorder, unspecified: Secondary | ICD-10-CM | POA: Diagnosis not present

## 2016-09-30 DIAGNOSIS — E1169 Type 2 diabetes mellitus with other specified complication: Secondary | ICD-10-CM | POA: Diagnosis not present

## 2016-09-30 DIAGNOSIS — E782 Mixed hyperlipidemia: Secondary | ICD-10-CM | POA: Diagnosis not present

## 2016-09-30 DIAGNOSIS — E669 Obesity, unspecified: Secondary | ICD-10-CM

## 2016-09-30 DIAGNOSIS — R7989 Other specified abnormal findings of blood chemistry: Secondary | ICD-10-CM

## 2016-09-30 DIAGNOSIS — I10 Essential (primary) hypertension: Secondary | ICD-10-CM

## 2016-09-30 DIAGNOSIS — N644 Mastodynia: Secondary | ICD-10-CM

## 2016-09-30 HISTORY — DX: Mastodynia: N64.4

## 2016-09-30 LAB — COMPREHENSIVE METABOLIC PANEL
ALBUMIN: 3.8 g/dL (ref 3.5–5.2)
ALK PHOS: 51 U/L (ref 39–117)
ALT: 42 U/L (ref 0–53)
AST: 33 U/L (ref 0–37)
BILIRUBIN TOTAL: 0.8 mg/dL (ref 0.2–1.2)
BUN: 10 mg/dL (ref 6–23)
CO2: 21 mEq/L (ref 19–32)
Calcium: 8.9 mg/dL (ref 8.4–10.5)
Chloride: 107 mEq/L (ref 96–112)
Creatinine, Ser: 0.77 mg/dL (ref 0.40–1.50)
GFR: 111.94 mL/min (ref >60.00)
Glucose, Bld: 75 mg/dL (ref 70–99)
POTASSIUM: 3.5 meq/L (ref 3.5–5.1)
SODIUM: 136 meq/L (ref 135–145)
TOTAL PROTEIN: 6.9 g/dL (ref 6.0–8.3)

## 2016-09-30 LAB — CBC
HEMATOCRIT: 41.3 % (ref 39.0–52.0)
HEMOGLOBIN: 14.3 g/dL (ref 13.0–17.0)
MCHC: 34.5 g/dL (ref 30.0–36.0)
MCV: 90.6 fl (ref 78.0–100.0)
Platelets: 297 10*3/uL (ref 150.0–400.0)
RBC: 4.57 Mil/uL (ref 4.22–5.81)
RDW: 12.6 % (ref 11.5–15.5)
WBC: 9.1 10*3/uL (ref 4.0–10.5)

## 2016-09-30 LAB — LIPID PANEL
Cholesterol: 174 mg/dL (ref 0–200)
HDL: 28.5 mg/dL — ABNORMAL LOW (ref >39.00)
NonHDL: 145.27
Total CHOL/HDL Ratio: 6
Triglycerides: 380 mg/dL — ABNORMAL HIGH (ref 0.0–149.0)
VLDL: 76 mg/dL — AB (ref 0.0–40.0)

## 2016-09-30 LAB — LDL CHOLESTEROL, DIRECT: LDL DIRECT: 62 mg/dL

## 2016-09-30 LAB — HEMOGLOBIN A1C: Hgb A1c MFr Bld: 7.9 % — ABNORMAL HIGH (ref 4.6–6.5)

## 2016-09-30 LAB — TSH: TSH: 3.21 u[IU]/mL (ref 0.35–4.50)

## 2016-09-30 NOTE — Progress Notes (Signed)
Pre visit review using our clinic review tool, if applicable. No additional management support is needed unless otherwise documented below in the visit note. 

## 2016-09-30 NOTE — Patient Instructions (Signed)
Hyperglycemia  Hyperglycemia occurs when the level of sugar (glucose) in the blood is too high. Glucose is a type of sugar that provides the body's main source of energy. Certain hormones (insulin and glucagon) control the level of glucose in the blood. Insulin lowers blood glucose, and glucagon increases blood glucose. Hyperglycemia can result from having too little insulin in the bloodstream, or from the body not responding normally to insulin.  Hyperglycemia occurs most often in people who have diabetes (diabetes mellitus), but it can happen in people who do not have diabetes. It can develop quickly, and it can be life-threatening if it causes you to become severely dehydrated (diabetic ketoacidosis or hyperglycemic hyperosmolar state). Severe hyperglycemia is a medical emergency.  What are the causes?  If you have diabetes, hyperglycemia may be caused by:  · Diabetes medicine.  · Medicines that increase blood glucose or affect your diabetes control.  · Not eating enough, or not eating often enough.  · Changes in physical activity level.  · Being sick or having an infection.    If you have prediabetes or undiagnosed diabetes:  · Hyperglycemia may be caused by those conditions.    If you do not have diabetes, hyperglycemia may be caused by:  · Certain medicines, including steroid medicines, beta-blockers, epinephrine, and thiazide diuretics.  · Stress.  · Serious illness.  · Surgery.  · Diseases of the pancreas.  · Infection.    What increases the risk?  Hyperglycemia is more likely to develop in people who have risk factors for diabetes, such as:  · Having a family member with diabetes.  · Having a gene for type 1 diabetes that is passed from parent to child (inherited).  · Living in an area with cold weather conditions.  · Exposure to certain viruses.  · Certain conditions in which the body's disease-fighting (immune) system attacks itself (autoimmune disorders).  · Being overweight or obese.  · Having an  inactive (sedentary) lifestyle.  · Having been diagnosed with insulin resistance.  · Having a history of prediabetes, gestational diabetes, or polycystic ovarian syndrome (PCOS).  · Being of American-Indian, African-American, Hispanic/Latino, or Asian/Pacific Islander descent.    What are the signs or symptoms?  Hyperglycemia may not cause any symptoms. If you do have symptoms, they may include early warning signs, such as:  · Increased thirst.  · Hunger.  · Feeling very tired.  · Needing to urinate more often than usual.  · Blurry vision.    Other symptoms may develop if hyperglycemia gets worse, such as:  · Dry mouth.  · Loss of appetite.  · Fruity-smelling breath.  · Weakness.  · Unexpected or rapid weight gain or weight loss.  · Tingling or numbness in the hands or feet.  · Headache.  · Skin that does not quickly return to normal after being lightly pinched and released (poor skin turgor).  · Abdominal pain.  · Cuts or bruises that are slow to heal.    How is this diagnosed?  Hyperglycemia is diagnosed with a blood test to measure your blood glucose level. This blood test is usually done while you are having symptoms. Your health care provider may also do a physical exam and review your medical history.  You may have more tests to determine the cause of your hyperglycemia, such as:  · A fasting blood glucose (FBG) test. You will not be allowed to eat (you will fast) for at least 8 hours before a blood sample is   taken.  · An A1c (hemoglobin A1c) blood test. This provides information about blood glucose control over the previous 2-3 months.  · An oral glucose tolerance test (OGTT). This measures your blood glucose at two times:  ? After fasting. This is your baseline blood glucose level.  ? Two hours after drinking a beverage that contains glucose.    How is this treated?  Treatment depends on the cause of your hyperglycemia. Treatment may include:  · Taking medicine to regulate your blood glucose levels. If you  take insulin or other diabetes medicines, your medicine or dosage may be adjusted.  · Lifestyle changes, such as exercising more, eating healthier foods, or losing weight.  · Treating an illness or infection, if this caused your hyperglycemia.  · Checking your blood glucose more often.  · Stopping or reducing steroid medicines, if these caused your hyperglycemia.    If your hyperglycemia becomes severe and it results in hyperglycemic hyperosmolar state, you must be hospitalized and given IV fluids.  Follow these instructions at home:  General instructions   · Take over-the-counter and prescription medicines only as told by your health care provider.  · Do not use any products that contain nicotine or tobacco, such as cigarettes and e-cigarettes. If you need help quitting, ask your health care provider.  · Limit alcohol intake to no more than 1 drink per day for nonpregnant women and 2 drinks per day for men. One drink equals 12 oz of beer, 5 oz of wine, or 1½ oz of hard liquor.  · Learn to manage stress. If you need help with this, ask your health care provider.  · Keep all follow-up visits as told by your health care provider. This is important.  Eating and drinking   · Maintain a healthy weight.  · Exercise regularly, as directed by your health care provider.  · Stay hydrated, especially when you exercise, get sick, or spend time in hot temperatures.  · Eat healthy foods, such as:  ? Lean proteins.  ? Complex carbohydrates.  ? Fresh fruits and vegetables.  ? Low-fat dairy products.  ? Healthy fats.  · Drink enough fluid to keep your urine clear or pale yellow.  If you have diabetes:     · Make sure you know the symptoms of hyperglycemia.  · Follow your diabetes management plan, as told by your health care provider. Make sure you:  ? Take your insulin and medicines as directed.  ? Follow your exercise plan.  ? Follow your meal plan. Eat on time, and do not skip meals.  ? Check your blood glucose as often as  directed. Make sure to check your blood glucose before and after exercise. If you exercise longer or in a different way than usual, check your blood glucose more often.  ? Follow your sick day plan whenever you cannot eat or drink normally. Make this plan in advance with your health care provider.  · Share your diabetes management plan with people in your workplace, school, and household.  · Check your urine for ketones when you are ill and as told by your health care provider.  · Carry a medical alert card or wear medical alert jewelry.  Contact a health care provider if:  · Your blood glucose is at or above 240 mg/dL (13.3 mmol/L) for 2 days in a row.  · You have problems keeping your blood glucose in your target range.  · You have frequent episodes of hyperglycemia.    Get help right away if:  · You have difficulty breathing.  · You have a change in how you think, feel, or act (mental status).  · You have nausea or vomiting that does not go away.  These symptoms may represent a serious problem that is an emergency. Do not wait to see if the symptoms will go away. Get medical help right away. Call your local emergency services (911 in the [place] Do not drive yourself to the hospital.  Summary  · Hyperglycemia occurs when the level of sugar (glucose) in the blood is too high.  · Hyperglycemia is diagnosed with a blood test to measure your blood glucose level. This blood test is usually done while you are having symptoms. Your health care provider may also do a physical exam and review your medical history.  · If you have diabetes, follow your diabetes management plan as told by your health care provider.  · Contact your health care provider if you have problems keeping your blood glucose in your target range.  This information is not intended to replace advice given to you by your health care provider. Make sure you discuss any questions you have with your health care provider.  Document Released: 12/29/2000 Document  Revised: 03/22/2016 Document Reviewed: 03/22/2016  [employer]vier Interactive Patient Education © 2017 [employer]

## 2016-09-30 NOTE — Progress Notes (Signed)
Subjective:  I acted as a Education administrator for Dr. Charlett Blake. [place], [place]   Patient ID: [name], male    DOB: 08/21/1960, 56 y.o.   MRN: [id number]  Chief Complaint  Patient presents with  . Follow-up  . Hypertension  . Diabetes  . Breast Problem    Male nipple pain. On going for 1 month.     Hypertension  This is a recurrent problem. The problem is unchanged. Associated symptoms include malaise/fatigue. Pertinent negatives include no blurred vision, chest pain, headaches, palpitations or shortness of breath.  Diabetes  He presents for his follow-up diabetic visit. He has type 2 diabetes mellitus. Pertinent negatives for hypoglycemia include no headaches. Pertinent negatives for diabetes include no blurred vision and no chest pain. He is compliant with treatment some of the time.    Patient is in today for a 3 month follow up following upon hypertension, diabetes type 2 and other medical conditions. Patient complains of his nipples hurting, negative for discharge. He reports it has begun to improve. He denies any change in diet or recent febrile illness. Denies CP/SOB/HA/congestion/fevers/GI or GU c/o. He is not taking his meds. He has noted some palpitations qhs but has not been taking his Metoprolol regularly. Has only een using his diabetic meds 50 % of the time. No polyuria or polydipsia Patient Care Team: Mosie Lukes, MD as PCP - General (Family Medicine)   Past Medical History:  Diagnosis Date  . Anxiety associated with depression 10/11/2013  . Colon cancer ([place])    right colon cancer- adenocarcinoma CEA level isnrmal at 1.8  . Diabetes mellitus   . Erectile dysfunction 06/22/2016  . Hepatic artery stenosis ([place])   . Hyperlipidemia   . Hypertension   . Hypertriglyceridemia 12/14/2010  . Iron deficiency anemia 2011  . Low testosterone 06/22/2016  . Nipple pain 09/30/2016  . Obesity   . Preventative health care 10/08/2012  . Psoriasis     Past Surgical History:  Procedure  Laterality Date  . CARPAL TUNNEL RELEASE     RIGHT  . CHOLECYSTECTOMY  1994  . COLONOSCOPY    . HEMICOLECTOMY     12/17/2009 right  . POLYPECTOMY      Family History  Problem Relation Age of Onset  . Stomach cancer Mother     diedinher 20's  . Diabetes type II Brother     boderline  . Diabetes Brother     type II  . Alcohol abuse Brother   . Other Neg Hx     cad,prostate ca, colon ca  . Coronary artery disease Neg Hx   . Cancer Neg Hx     colon, prostate    Social History   Social History  . Marital status: Single    Spouse name: [name]  . Number of children: 0  . Years of education: N/A   Occupational History  . Chartered certified accountant and fax   Social History Main Topics  . Smoking status: Current Some Day Smoker    Types: Cigarettes  . Smokeless tobacco: Never Used  . Alcohol use No  . Drug use: No  . Sexual activity: Yes     Comment: lives with girlfriend, no dietary restrictions   Other Topics Concern  . Not on file   Social History Narrative   Occupation: Theatre stage manager and fax)   Single  (lives with [name])   no children    former smoker   Illicit Drug Use -  no     Outpatient Medications Prior to Visit  Medication Sig Dispense Refill  . atorvastatin (LIPITOR) 20 MG tablet Take 1 tablet (20 mg total) by mouth daily. 90 tablet 3  . Blood Glucose Monitoring Suppl (ONETOUCH VERIO) W/DEVICE KIT   0  . Clobetasol Prop Emollient Base 0.05 % emollient cream Apply topically daily as needed.      Marland Kitchen CLOBEX SPRAY 0.05 % external spray     . diazepam (VALIUM) 5 MG tablet Take 1 tablet (5 mg total) by mouth every 12 (twelve) hours. 60 tablet 1  . diazepam (VALIUM) 5 MG tablet Take 1 tablet (5 mg total) by mouth every 12 (twelve) hours as needed for anxiety. 60 tablet 1  . escitalopram (LEXAPRO) 10 MG tablet TAKE 1 TABLET (10 MG TOTAL) BY MOUTH DAILY. 90 tablet 0  . fenofibrate 160 MG tablet Take 1 tablet (160 mg total) by mouth daily. 90 tablet 3  .  glimepiride (AMARYL) 1 MG tablet TAKE 1 TABLET BY MOUTH EVERY DAY BEFORE BREAKFAST 90 tablet 2  . glucose blood (ONETOUCH VERIO) test strip Test three times daily, DX E11.9 300 each 6  . insulin aspart (NOVOLOG FLEXPEN) 100 UNIT/ML FlexPen INJECT 10-16 UNITS UNDER SKIN 3 TIMES A DAY BEFORE MEALS 15 pen 3  . Insulin Detemir (LEVEMIR FLEXTOUCH) 100 UNIT/ML Pen Inject 60 Units into the skin daily at 10 pm. 30 mL 3  . Insulin Glargine (LANTUS SOLOSTAR) 100 UNIT/ML Solostar Pen INJECT 60 UNITS SUBCUTANEOUS EVERY NIGHT AT 10PM **PER INSURANSE 3 BOXES FOR 90 DAYS** 45 pen 3  . Insulin Pen Needle (B-D ULTRAFINE III SHORT PEN) 31G X 8 MM MISC Test as directed three times daily.  DX E11.9 300 each 3  . lisinopril (PRINIVIL,ZESTRIL) 5 MG tablet TAKE 1 TABLET (5 MG TOTAL) BY MOUTH DAILY. 90 tablet 2  . losartan (COZAAR) 50 MG tablet TAKE 1 TABLET (50 MG TOTAL) BY MOUTH DAILY. 90 tablet 1  . metFORMIN (GLUCOPHAGE) 1000 MG tablet TAKE 1 TABLET (1,000 MG TOTAL) BY MOUTH 2 (TWO) TIMES DAILY WITH A MEAL. 180 tablet 2  . methocarbamol (ROBAXIN) 500 MG tablet TAKE 1 TABLET EVERY 8 HOURSAS NEEDED FOR MUSCLE SPASMS 180 tablet 1  . metoprolol succinate (TOPROL-XL) 25 MG 24 hr tablet TAKE 1 TABLET (25 MG TOTAL) BY MOUTH DAILY. 90 tablet 1  . omeprazole (PRILOSEC) 40 MG capsule Take 1 capsule (40 mg total) by mouth 2 (two) times daily. 180 capsule 3  . [place] LANCETS MISC     . oxyCODONE-acetaminophen (ROXICET) 5-325 MG tablet Take 1 tablet by mouth every 8 (eight) hours as needed for moderate pain or severe pain (Do not drive or operate heavy machinery while taking as can cause drowsiness.). 9 tablet 0  . sildenafil (REVATIO) 20 MG tablet 1-5 tabs po daily ED 90 tablet 1  . ustekinumab (STELARA) 90 MG/ML SOSY injection Inject 1 mL (90 mg total) into the skin every 3 (three) months.    . zolpidem (AMBIEN) 10 MG tablet Take 1 tablet (10 mg total) by mouth at bedtime as needed. 90 tablet 1   No  facility-administered medications prior to visit.     No Known Allergies  Review of Systems  Constitutional: Positive for malaise/fatigue. Negative for fever.  HENT: Negative for congestion.   Eyes: Negative for blurred vision.  Respiratory: Negative for cough and shortness of breath.   Cardiovascular: Negative for chest pain, palpitations and leg swelling.  Gastrointestinal: Negative for vomiting.  Musculoskeletal: Negative for back pain.  Skin: Negative for rash.  Neurological: Negative for loss of consciousness and headaches.       Objective:    Physical Exam  Constitutional: He is oriented to person, place, and time. He appears well-developed and well-nourished. No distress.  HENT:  Head: Normocephalic and atraumatic.  Nose: Nose normal.  Eyes: Conjunctivae are normal. Right eye exhibits no discharge. Left eye exhibits no discharge.  Neck: Normal range of motion. Neck supple. No thyromegaly present.  Cardiovascular: Normal rate and regular rhythm.   No murmur heard. Pulmonary/Chest: Effort normal and breath sounds normal. He has no wheezes.  Abdominal: Soft. Bowel sounds are normal. There is no tenderness.  Musculoskeletal: Normal range of motion. He exhibits no edema or deformity.  Neurological: He is alert and oriented to person, place, and time.  Skin: Skin is warm and dry. He is not diaphoretic.  Psychiatric: He has a normal mood and affect.  Nursing note and vitals reviewed.   BP 128/68 (BP Location: Left Arm, Patient Position: Sitting, Cuff Size: Large)   Pulse 99   Temp 98.2 F (36.8 C) (Oral)   Resp 18   Wt 244 lb 12.8 oz (111 kg)   SpO2 96%   BMI 37.22 kg/m  Wt Readings from Last 3 Encounters:  09/30/16 244 lb 12.8 oz (111 kg)  06/22/16 235 lb 6.4 oz (106.8 kg)  01/29/16 244 lb 6 oz (110.8 kg)      Immunization History  Administered Date(s) Administered  . Influenza Whole 03/20/2010  . Influenza,inj,Quad PF,36+ Mos 05/01/2015, 06/22/2016  . Tdap  05/01/2015    Health Maintenance  Topic Date Due  . PNEUMOCOCCAL POLYSACCHARIDE VACCINE (1) 11/03/1964  . FOOT EXAM  11/03/1972  . OPHTHALMOLOGY EXAM  11/03/1972  . HIV Screening  11/03/1977  . COLONOSCOPY  06/22/2016  . HEMOGLOBIN A1C  12/24/2016  . TETANUS/TDAP  04/30/2025  . INFLUENZA VACCINE  Completed  . Hepatitis C Screening  Completed    Lab Results  Component Value Date   WBC 10.8 (H) 06/25/2016   HGB 13.9 06/25/2016   HCT 40.6 06/25/2016   PLT 322.0 06/25/2016   GLUCOSE 190 (H) 06/25/2016   CHOL 139 06/25/2016   TRIG (H) 06/25/2016    407.0 Triglyceride is over 400; calculations on Lipids are invalid.   HDL 30.00 (L) 06/25/2016   LDLDIRECT 47.0 06/25/2016   LDLCALC SEE COMMENT 10/27/2014   ALT 22 06/25/2016   AST 17 06/25/2016   NA 139 06/25/2016   K 4.1 06/25/2016   CL 104 06/25/2016   CREATININE 0.76 06/25/2016   BUN 9 06/25/2016   CO2 29 06/25/2016   TSH 2.35 06/25/2016   PSA 0.18 06/25/2016   INR 0.9 10/27/2014   HGBA1C 8.9 (H) 06/25/2016   MICROALBUR 0.50 01/08/2013    Lab Results  Component Value Date   TSH 2.35 06/25/2016   Lab Results  Component Value Date   WBC 10.8 (H) 06/25/2016   HGB 13.9 06/25/2016   HCT 40.6 06/25/2016   MCV 90.2 06/25/2016   PLT 322.0 06/25/2016   Lab Results  Component Value Date   NA 139 06/25/2016   K 4.1 06/25/2016   CO2 29 06/25/2016   GLUCOSE 190 (H) 06/25/2016   BUN 9 06/25/2016   CREATININE 0.76 06/25/2016   BILITOT 0.8 06/25/2016   ALKPHOS 76 06/25/2016   AST 17 06/25/2016   ALT 22 06/25/2016   PROT 6.4 06/25/2016   ALBUMIN 3.7 06/25/2016  CALCIUM 9.2 06/25/2016   ANIONGAP 4 (L) 10/30/2014   GFR 113.75 06/25/2016   Lab Results  Component Value Date   CHOL 139 06/25/2016   Lab Results  Component Value Date   HDL 30.00 (L) 06/25/2016   Lab Results  Component Value Date   LDLCALC SEE COMMENT 10/27/2014   Lab Results  Component Value Date   TRIG (H) 06/25/2016    407.0 Triglyceride is  over 400; calculations on Lipids are invalid.   Lab Results  Component Value Date   CHOLHDL 5 06/25/2016   Lab Results  Component Value Date   HGBA1C 8.9 (H) 06/25/2016         Assessment & Plan:   Problem List Items Addressed This Visit    Diabetes mellitus type 2 in obese ([place])    hgba1c unacceptable, minimize simple carbs. Increase exercise as tolerated. Recheck A1C today, admits he has struggled with eating poorly and using his insulin and tablets roughly 50 % of the time.      Relevant Orders   Hemoglobin A1c   Hyperlipidemia, mixed    Tolerating statin, encouraged heart healthy diet, avoid trans fats, minimize simple carbs and saturated fats. Increase exercise as tolerated      Relevant Orders   Lipid panel   Essential hypertension - Primary    Well controlled, no changes to meds. Encouraged heart healthy diet such as the DASH diet and exercise as tolerated.       Relevant Orders   CBC   Comprehensive metabolic panel   TSH   Hypotestosteronism      I am having Mr. Mcmanaway maintain his Clobetasol Prop Emollient Base, ONETOUCH [place] LANCETS, CLOBEX SPRAY, ONETOUCH VERIO, glucose blood, Insulin Pen Needle, Insulin Glargine, methocarbamol, oxyCODONE-acetaminophen, ustekinumab, glimepiride, lisinopril, losartan, sildenafil, atorvastatin, fenofibrate, omeprazole, metFORMIN, metoprolol succinate, escitalopram, diazepam, zolpidem, diazepam, Insulin Detemir, and insulin aspart.  No orders of the defined types were placed in this encounter.   CMA served as Education administrator during this visit. History, Physical and Plan performed by medical provider. Documentation and orders reviewed and attested to.  Penni Homans, MD

## 2016-09-30 NOTE — Assessment & Plan Note (Addendum)
hgba1c unacceptable, minimize simple carbs. Increase exercise as tolerated. Recheck A1C today, admits he has struggled with eating poorly and using his insulin and tablets roughly 50 % of the time.

## 2016-09-30 NOTE — Assessment & Plan Note (Signed)
Encouraged DASH diet, decrease po intake and increase exercise as tolerated. Needs 7-8 hours of sleep nightly. Avoid trans fats, eat small, frequent meals every 4-5 hours with lean proteins, complex carbs and healthy fats. Minimize simple carbs, referred to [facility]

## 2016-09-30 NOTE — Assessment & Plan Note (Signed)
Tolerating statin, encouraged heart healthy diet, avoid trans fats, minimize simple carbs and saturated fats. Increase exercise as tolerated 

## 2016-09-30 NOTE — Assessment & Plan Note (Signed)
Well controlled, no changes to meds. Encouraged heart healthy diet such as the DASH diet and exercise as tolerated.  °

## 2016-10-10 IMAGING — CT CT ABD-PELV W/O CM
1 of 2 series · 14 of 32 positions shown, 18 images · non-contrast
Comparison: None.

CLINICAL DATA: Upper abdominal and back pain beginning today,
pressure. Remote history of colon cancer and colectomy. History of
cholecystectomy.

EXAM:
CT ABDOMEN AND PELVIS WITHOUT CONTRAST
TECHNIQUE: Multidetector CT imaging of the abdomen and pelvis was performed
following the standard protocol without IV contrast. Creatinine
values not available.

[Series 2: routine abd pel without · axial · non-contrast · 0.77mm/px · z∈[-500,-80]mm · 14 of 94 slices shown, 18 images]
[im 5/94  soft-tissue]
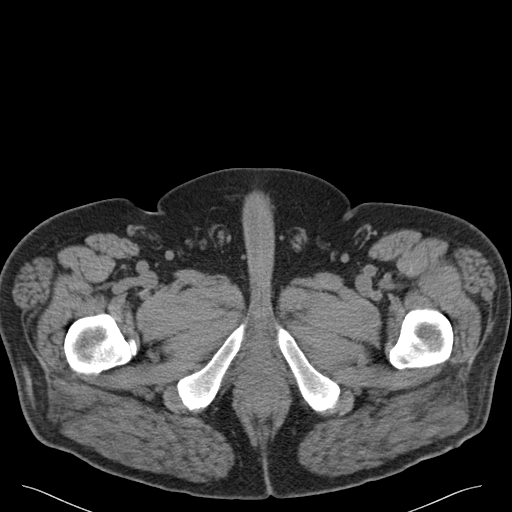
[im 5/94  bone]
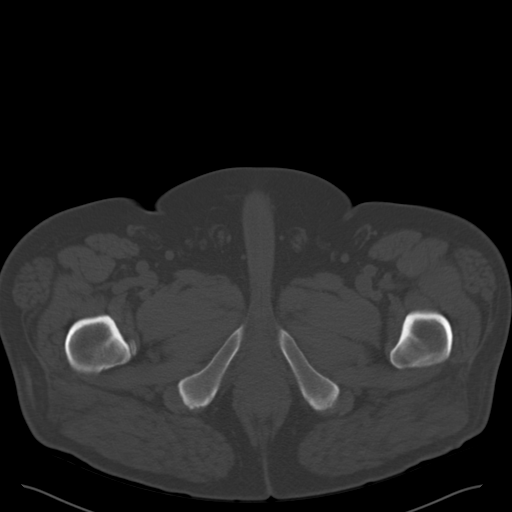
[im 13/94  soft-tissue]
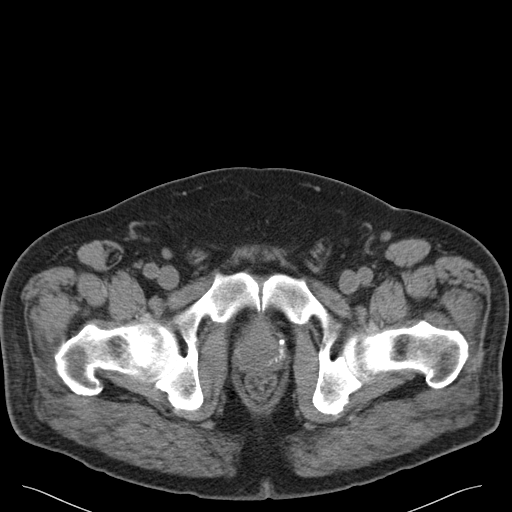
[im 21/94  soft-tissue]
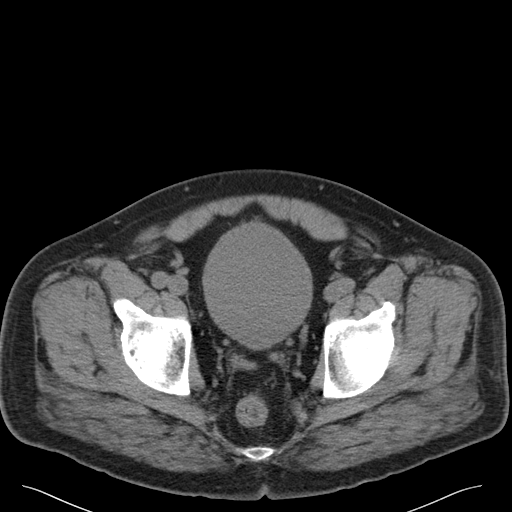
[im 29/94  soft-tissue]
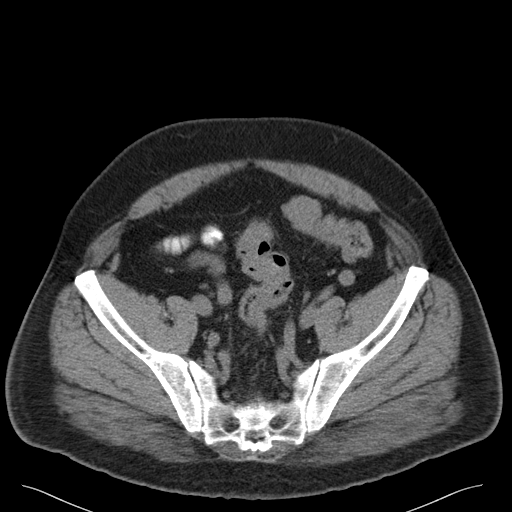
[im 37/94  soft-tissue]
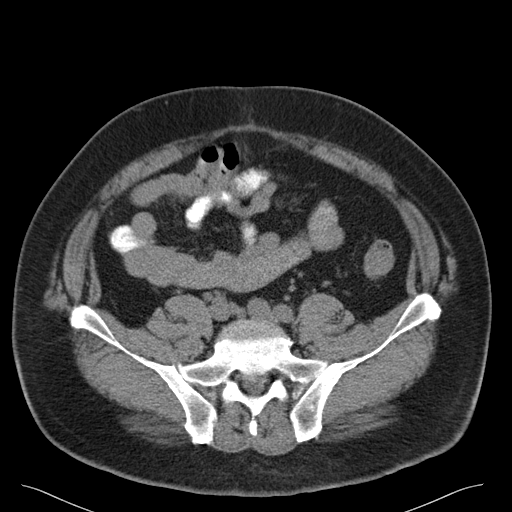
[im 45/94  soft-tissue]
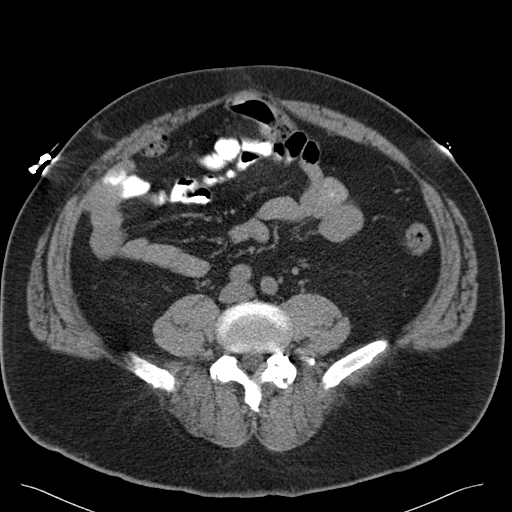
[im 49/94  soft-tissue]
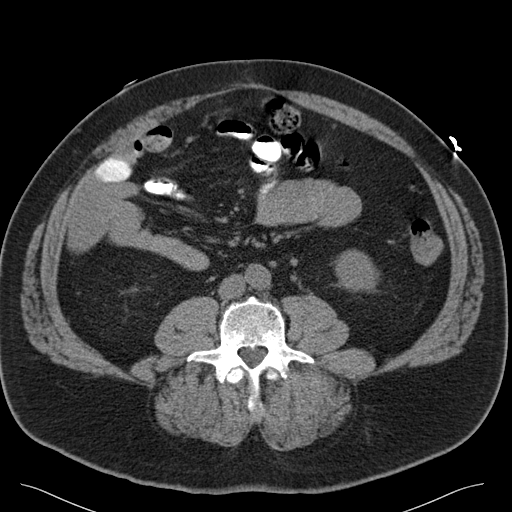
[im 57/94  soft-tissue]
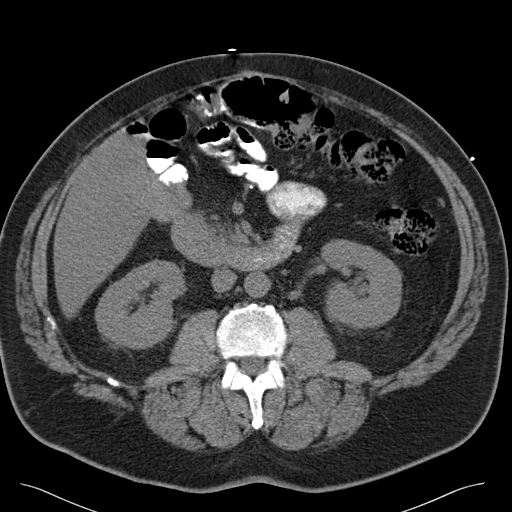
[im 65/94  soft-tissue]
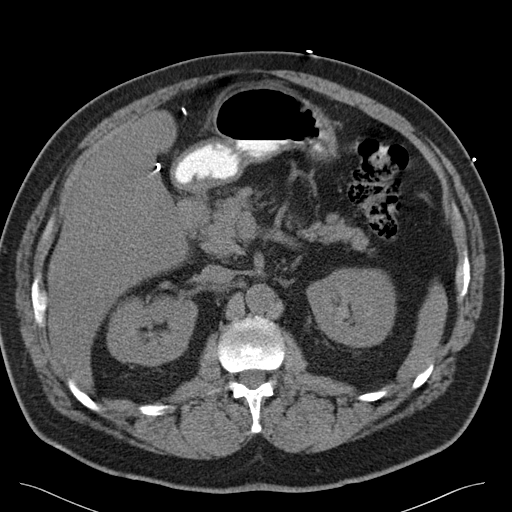
[im 65/94  bone]
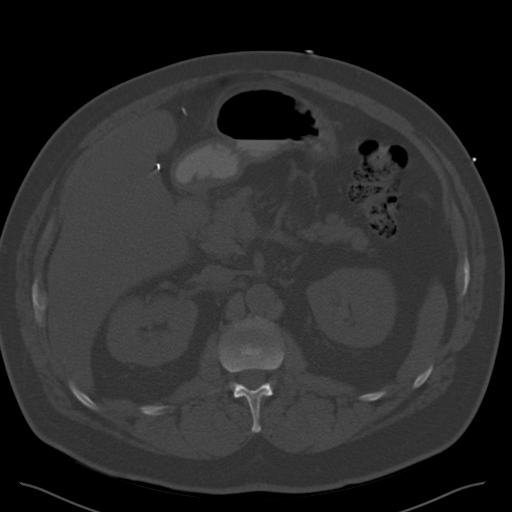
[im 73/94  soft-tissue]
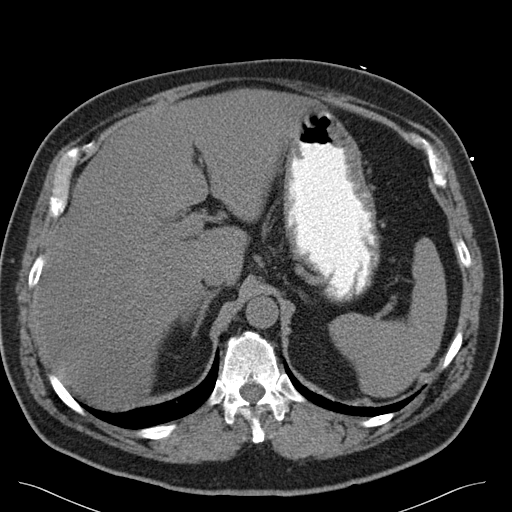
[im 77/94  lung]
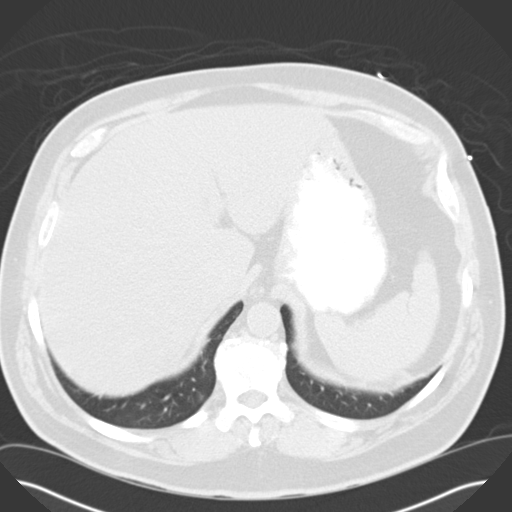
[im 81/94  soft-tissue]
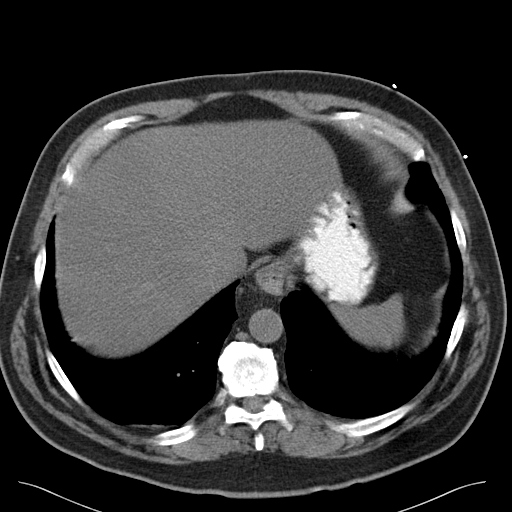
[im 81/94  lung]
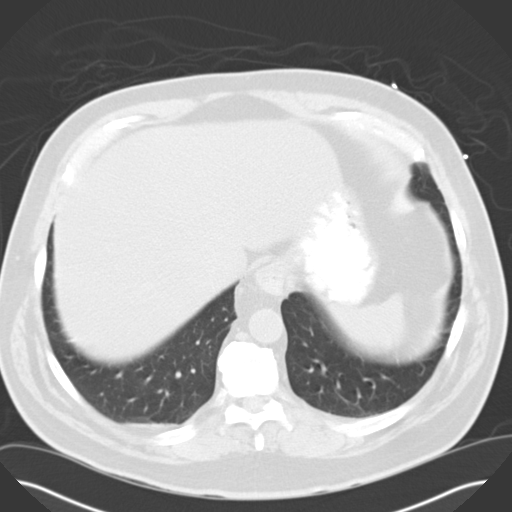
[im 85/94  lung]
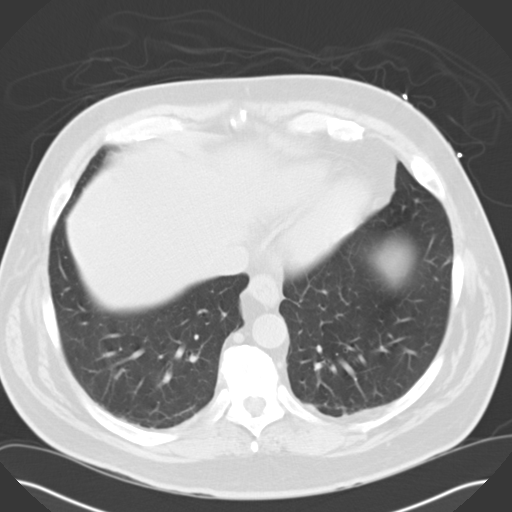
[im 89/94  soft-tissue]
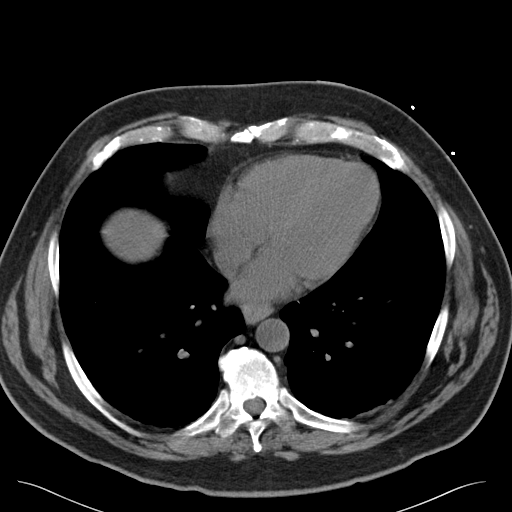
[im 89/94  lung]
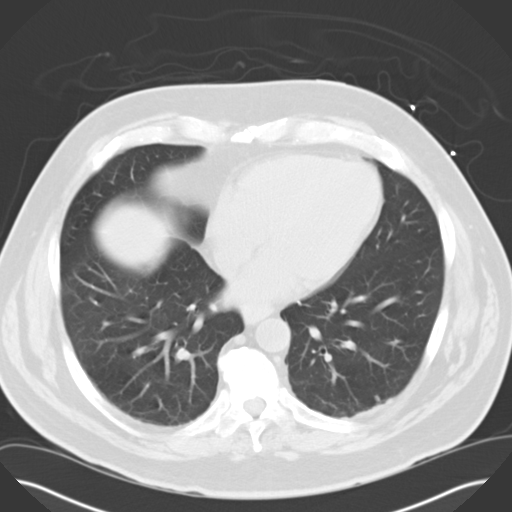

[14 of 32 positions shown; findings below may reference images not displayed]

FINDINGS: LUNG BASES: Included view of the lung bases demonstrate mild pleural
thickening. The visualized heart and pericardium are unremarkable.
Mildly thickened distal esophagus can be associated with reflux
disease.

KIDNEYS/BLADDER: Kidneys are orthotopic, demonstrating normal size
and morphology. No nephrolithiasis, hydronephrosis; limited
assessment for renal masses on this nonenhanced examination. The
unopacified ureters are normal in course and caliber. Urinary
bladder is partially distended and unremarkable.

SOLID ORGANS: The liver is diffusely hypodense consistent with
hepatic steatosis, and otherwise unremarkable by noncontrast CT.
Status post cholecystectomy. Faint peripancreatic head stranding.
Spleen, and adrenal glands are unremarkable for this non-contrast
examination.

GASTROINTESTINAL TRACT: Status post RIGHT hemicolectomy. The
stomach, small bowel are normal in course and caliber without
inflammatory changes, the sensitivity may be decreased by lack of
enteric contrast. Enteric contrast has not yet reached the distal
small bowel. Normal appendix.

PERITONEUM/RETROPERITONEUM: Aortoiliac vessels are normal in course
and caliber, trace calcific atherosclerosis. No lymphadenopathy by
CT size criteria. Prostate appears normal. No intraperitoneal free
fluid nor free air. Phleboliths in the pelvis.

SOFT TISSUES/ OSSEOUS STRUCTURES: Nonsuspicious. Small fat
containing inguinal hernias. Small fat containing umbilical hernia.
Ventral abdominal wall scarring. Multiple tiny fat containing
ventral hernias. Mild degenerative change of the lumbar spine.
IMPRESSION: Faint peripancreatic head fat stranding, recommend correlation with
amylase and lipase.

Status post RIGHT hemicolectomy.  Status post cholecystectomy.

Hepatic steatosis.

By: Ebadat Tiger

## 2016-12-28 ENCOUNTER — Other Ambulatory Visit: Payer: Self-pay | Admitting: Family Medicine

## 2016-12-28 DIAGNOSIS — R002 Palpitations: Secondary | ICD-10-CM

## 2016-12-28 DIAGNOSIS — I10 Essential (primary) hypertension: Secondary | ICD-10-CM

## 2016-12-28 DIAGNOSIS — K859 Acute pancreatitis without necrosis or infection, unspecified: Secondary | ICD-10-CM

## 2016-12-28 DIAGNOSIS — E782 Mixed hyperlipidemia: Secondary | ICD-10-CM

## 2016-12-28 DIAGNOSIS — E669 Obesity, unspecified: Secondary | ICD-10-CM

## 2016-12-28 DIAGNOSIS — E1169 Type 2 diabetes mellitus with other specified complication: Secondary | ICD-10-CM

## 2016-12-28 NOTE — Telephone Encounter (Signed)
Requesting:   diazepam Contract  08/12/2016 UDS    Low risk next is due on 02/09/2017 Last OV    09/30/2016---future appt is on 01/03/2017 Last Refill    #60 with 1 refill on 08/13/2016  Please Advise

## 2016-12-30 NOTE — Telephone Encounter (Signed)
Faxed hardcopy to [facility] in [place]

## 2017-01-03 ENCOUNTER — Encounter: Payer: BLUE CROSS/BLUE SHIELD | Admitting: Family Medicine

## 2017-02-09 DIAGNOSIS — L400 Psoriasis vulgaris: Secondary | ICD-10-CM | POA: Diagnosis not present

## 2017-02-09 DIAGNOSIS — L700 Acne vulgaris: Secondary | ICD-10-CM | POA: Diagnosis not present

## 2017-02-09 DIAGNOSIS — D485 Neoplasm of uncertain behavior of skin: Secondary | ICD-10-CM | POA: Diagnosis not present

## 2017-02-09 DIAGNOSIS — L578 Other skin changes due to chronic exposure to nonionizing radiation: Secondary | ICD-10-CM | POA: Diagnosis not present

## 2017-02-15 ENCOUNTER — Other Ambulatory Visit: Payer: Self-pay | Admitting: Family Medicine

## 2017-02-15 NOTE — Telephone Encounter (Signed)
Requesting:    zolpidem Contract     08/13/2016 UDS     Low risk is due on 02/09/2017 Last OV    09/30/2016 Last Refill    #90 with 1 refill on 08/13/2016  Please Advise

## 2017-02-16 NOTE — Telephone Encounter (Signed)
Have signed refill but needs UDS updated

## 2017-02-17 NOTE — Telephone Encounter (Signed)
Printed /PCP signed and called the patient to inform to pickup hardcopy/update UDS> Called left message to call back.

## 2017-02-17 NOTE — Telephone Encounter (Signed)
Patient is unable to come by due to work--he will just take care of at his Appointment in October. Shredded the prescription as not needed at this time.

## 2017-03-31 ENCOUNTER — Other Ambulatory Visit: Payer: Self-pay | Admitting: Family Medicine

## 2017-04-18 ENCOUNTER — Encounter: Payer: Self-pay | Admitting: Family Medicine

## 2017-04-18 ENCOUNTER — Ambulatory Visit (INDEPENDENT_AMBULATORY_CARE_PROVIDER_SITE_OTHER): Payer: BLUE CROSS/BLUE SHIELD | Admitting: Family Medicine

## 2017-04-18 VITALS — Resp 18 | Ht 68.0 in | Wt 235.0 lb

## 2017-04-18 DIAGNOSIS — F418 Other specified anxiety disorders: Secondary | ICD-10-CM

## 2017-04-18 DIAGNOSIS — M79674 Pain in right toe(s): Secondary | ICD-10-CM | POA: Insufficient documentation

## 2017-04-18 DIAGNOSIS — I10 Essential (primary) hypertension: Secondary | ICD-10-CM | POA: Diagnosis not present

## 2017-04-18 DIAGNOSIS — E349 Endocrine disorder, unspecified: Secondary | ICD-10-CM

## 2017-04-18 DIAGNOSIS — L409 Psoriasis, unspecified: Secondary | ICD-10-CM | POA: Diagnosis not present

## 2017-04-18 DIAGNOSIS — Z0000 Encounter for general adult medical examination without abnormal findings: Secondary | ICD-10-CM | POA: Diagnosis not present

## 2017-04-18 DIAGNOSIS — Z125 Encounter for screening for malignant neoplasm of prostate: Secondary | ICD-10-CM

## 2017-04-18 DIAGNOSIS — Z79899 Other long term (current) drug therapy: Secondary | ICD-10-CM | POA: Diagnosis not present

## 2017-04-18 DIAGNOSIS — Z23 Encounter for immunization: Secondary | ICD-10-CM

## 2017-04-18 DIAGNOSIS — M25561 Pain in right knee: Secondary | ICD-10-CM | POA: Diagnosis not present

## 2017-04-18 DIAGNOSIS — E669 Obesity, unspecified: Secondary | ICD-10-CM

## 2017-04-18 DIAGNOSIS — E782 Mixed hyperlipidemia: Secondary | ICD-10-CM

## 2017-04-18 DIAGNOSIS — E1169 Type 2 diabetes mellitus with other specified complication: Secondary | ICD-10-CM

## 2017-04-18 HISTORY — DX: Pain in right toe(s): M79.674

## 2017-04-18 LAB — COMPREHENSIVE METABOLIC PANEL
ALK PHOS: 64 U/L (ref 39–117)
ALT: 23 U/L (ref 0–53)
AST: 18 U/L (ref 0–37)
Albumin: 3.9 g/dL (ref 3.5–5.2)
BILIRUBIN TOTAL: 0.6 mg/dL (ref 0.2–1.2)
BUN: 9 mg/dL (ref 6–23)
CO2: 26 meq/L (ref 19–32)
Calcium: 9.3 mg/dL (ref 8.4–10.5)
Chloride: 103 mEq/L (ref 96–112)
Creatinine, Ser: 0.68 mg/dL (ref 0.40–1.50)
GFR: 128.94 mL/min (ref >60.00)
GLUCOSE: 70 mg/dL (ref 70–99)
Potassium: 3.8 mEq/L (ref 3.5–5.1)
SODIUM: 137 meq/L (ref 135–145)
TOTAL PROTEIN: 7.3 g/dL (ref 6.0–8.3)

## 2017-04-18 LAB — LIPID PANEL
CHOLESTEROL: 170 mg/dL (ref 0–200)
HDL: 31.6 mg/dL — AB (ref >39.00)
NonHDL: 138.81
TRIGLYCERIDES: 345 mg/dL — AB (ref 0.0–149.0)
Total CHOL/HDL Ratio: 5
VLDL: 69 mg/dL — AB (ref 0.0–40.0)

## 2017-04-18 LAB — CBC WITH DIFFERENTIAL/PLATELET
BASOS ABS: 0.1 10*3/uL (ref 0.0–0.1)
Basophils Relative: 0.9 % (ref 0.0–3.0)
EOS PCT: 1.5 % (ref 0.0–5.0)
Eosinophils Absolute: 0.2 10*3/uL (ref 0.0–0.7)
HCT: 43.4 % (ref 39.0–52.0)
Hemoglobin: 14.8 g/dL (ref 13.0–17.0)
LYMPHS ABS: 2.7 10*3/uL (ref 0.7–4.0)
Lymphocytes Relative: 23.5 % (ref 12.0–46.0)
MCHC: 34 g/dL (ref 30.0–36.0)
MCV: 91.9 fl (ref 78.0–100.0)
MONO ABS: 0.9 10*3/uL (ref 0.1–1.0)
Monocytes Relative: 7.9 % (ref 3.0–12.0)
NEUTROS ABS: 7.5 10*3/uL (ref 1.4–7.7)
NEUTROS PCT: 66.2 % (ref 43.0–77.0)
Platelets: 361 10*3/uL (ref 150.0–400.0)
RBC: 4.72 Mil/uL (ref 4.22–5.81)
RDW: 12.2 % (ref 11.5–15.5)
WBC: 11.3 10*3/uL — ABNORMAL HIGH (ref 4.0–10.5)

## 2017-04-18 LAB — PSA: PSA: 0.27 ng/mL (ref 0.10–4.00)

## 2017-04-18 LAB — LDL CHOLESTEROL, DIRECT: Direct LDL: 54 mg/dL

## 2017-04-18 LAB — HEMOGLOBIN A1C: HEMOGLOBIN A1C: 9.2 % — AB (ref 4.6–6.5)

## 2017-04-18 LAB — URIC ACID: URIC ACID, SERUM: 5.1 mg/dL (ref 4.0–7.8)

## 2017-04-18 LAB — TSH: TSH: 3.29 u[IU]/mL (ref 0.35–4.50)

## 2017-04-18 MED ORDER — MELOXICAM 15 MG PO TABS: 15.0000 mg | ORAL_TABLET | Freq: Every day | ORAL | 1 refills | Status: DC | PRN

## 2017-04-18 MED ORDER — HYDROCODONE-ACETAMINOPHEN 5-325 MG PO TABS: 1.0000 | ORAL_TABLET | Freq: Four times a day (QID) | ORAL | 0 refills | Status: DC | PRN

## 2017-04-18 NOTE — Assessment & Plan Note (Signed)
Patient encouraged to maintain heart healthy diet, regular exercise, adequate sleep. Consider daily probiotics. Take medications as prescribed 

## 2017-04-18 NOTE — Assessment & Plan Note (Signed)
Check level 

## 2017-04-18 NOTE — Assessment & Plan Note (Signed)
Tolerating statin, encouraged heart healthy diet, avoid trans fats, minimize simple carbs and saturated fats. Increase exercise as tolerated 

## 2017-04-18 NOTE — Assessment & Plan Note (Addendum)
 Skin Care he started Mittie Bodo needs a Quantiferon check and other labs

## 2017-04-18 NOTE — Assessment & Plan Note (Signed)
Is under a great deal of stress with his engagement getting called off, increased stress at work. Diazepam helps with stress at times.

## 2017-04-18 NOTE — Patient Instructions (Addendum)
shingrix 2 shots over 2-6 months is the new shingles shot, call monthly or get at pharmacy  Preventive Care 40-64 Years, Male Preventive care refers to lifestyle choices and visits with your health care provider that can promote health and wellness. What does preventive care include?  A yearly physical exam. This is also called an annual well check.  Dental exams once or twice a year.  Routine eye exams. Ask your health care provider how often you should have your eyes checked.  Personal lifestyle choices, including: ? Daily care of your teeth and gums. ? Regular physical activity. ? Eating a healthy diet. ? Avoiding tobacco and drug use. ? Limiting alcohol use. ? Practicing safe sex. ? Taking low-dose aspirin every day starting at age 73. What happens during an annual well check? The services and screenings done by your health care provider during your annual well check will depend on your age, overall health, lifestyle risk factors, and family history of disease. Counseling Your health care provider may ask you questions about your:  Alcohol use.  Tobacco use.  Drug use.  Emotional well-being.  Home and relationship well-being.  Sexual activity.  Eating habits.  Work and work Astronomer.  Screening You may have the following tests or measurements:  Height, weight, and BMI.  Blood pressure.  Lipid and cholesterol levels. These may be checked every 5 years, or more frequently if you are over 38 years old.  Skin check.  Lung cancer screening. You may have this screening every year starting at age 8 if you have a 30-pack-year history of smoking and currently smoke or have quit within the past 15 years.  Fecal occult blood test (FOBT) of the stool. You may have this test every year starting at age 25.  Flexible sigmoidoscopy or colonoscopy. You may have a sigmoidoscopy every 5 years or a colonoscopy every 10 years starting at age 30.  Prostate cancer  screening. Recommendations will vary depending on your family history and other risks.  Hepatitis C blood test.  Hepatitis B blood test.  Sexually transmitted disease (STD) testing.  Diabetes screening. This is done by checking your blood sugar (glucose) after you have not eaten for a while (fasting). You may have this done every 1-3 years.  Discuss your test results, treatment options, and if necessary, the need for more tests with your health care provider. Vaccines Your health care provider may recommend certain vaccines, such as:  Influenza vaccine. This is recommended every year.  Tetanus, diphtheria, and acellular pertussis (Tdap, Td) vaccine. You may need a Td booster every 10 years.  Varicella vaccine. You may need this if you have not been vaccinated.  Zoster vaccine. You may need this after age 29.  Measles, mumps, and rubella (MMR) vaccine. You may need at least one dose of MMR if you were born in 1957 or later. You may also need a second dose.  Pneumococcal 13-valent conjugate (PCV13) vaccine. You may need this if you have certain conditions and have not been vaccinated.  Pneumococcal polysaccharide (PPSV23) vaccine. You may need one or two doses if you smoke cigarettes or if you have certain conditions.  Meningococcal vaccine. You may need this if you have certain conditions.  Hepatitis A vaccine. You may need this if you have certain conditions or if you travel or work in places where you may be exposed to hepatitis A.  Hepatitis B vaccine. You may need this if you have certain conditions or if you travel or  work in places where you may be exposed to hepatitis B.  Haemophilus influenzae type b (Hib) vaccine. You may need this if you have certain risk factors.  Talk to your health care provider about which screenings and vaccines you need and how often you need them. This information is not intended to replace advice given to you by your health care provider. Make  sure you discuss any questions you have with your health care provider. Document Released: 08/01/2015 Document Revised: 03/24/2016 Document Reviewed: 05/06/2015 Elsevier Interactive Patient Education  2017 [employer].

## 2017-04-18 NOTE — Assessment & Plan Note (Signed)
Well controlled, no changes to meds. Encouraged heart healthy diet such as the DASH diet and exercise as tolerated.  °

## 2017-04-18 NOTE — Assessment & Plan Note (Signed)
Encouraged DASH diet, decrease po intake and increase exercise as tolerated. Needs 7-8 hours of sleep nightly. Avoid trans fats, eat small, frequent meals every 4-5 hours with lean proteins, complex carbs and healthy fats. Minimize simple carbs, bariatric referral 

## 2017-04-18 NOTE — Assessment & Plan Note (Signed)
hgba1c acceptable, minimize simple carbs. Increase exercise as tolerated. Continue current meds. He has been eating badly and is very stressed

## 2017-04-18 NOTE — Assessment & Plan Note (Signed)
3 week history, try ice and Lidocaine if worse call

## 2017-04-18 NOTE — Progress Notes (Signed)
Subjective:  I acted as a Education administrator for Dr. Rogue Jury, CMA   Patient ID: [name], male    DOB: Feb 24, 1961, 56 y.o.   MRN: [id number]  Chief Complaint  Patient presents with  . Knee Pain    States right knee has been in pain for about 3 weeks. Doesn't know why. States he's like an Xray  . Toe Pain    Great toe on right side is in pain from a fall last year. Pt states he'd like to get Xrays  . Annual Exam    HPI  Patient is in today for his annual exam and for labs. Pt has complaints of right knee and toe pain also that he'd like to discuss with provider. He is under a great deal of stress at work and home but feels he is managing with current meds. He is complaining of right great toe pain and he did jam the toe recently on some stairs. No major trauma. No redness or warmth. Also notes knee pain especially with rotation. No redness or warmth. Denies CP/palp/SOB/HA/congestion/fevers/GI or GU c/o. Taking meds as prescribed. No polyuria or polydipsia and dnot checking sugars but is likely high as he acknowledges eating badly and on the run frequently  Patient Care Team: Mosie Lukes, MD as PCP - General (Family Medicine)   Past Medical History:  Diagnosis Date  . Anxiety associated with depression 10/11/2013  . Colon cancer ([place])    right colon cancer- adenocarcinoma CEA level isnrmal at 1.8  . Diabetes mellitus   . Erectile dysfunction 06/22/2016  . Great toe pain, right 04/18/2017  . Hepatic artery stenosis ([place])   . Hyperlipidemia   . Hypertension   . Hypertriglyceridemia 12/14/2010  . Iron deficiency anemia 2011  . Low testosterone 06/22/2016  . Nipple pain 09/30/2016  . Obesity   . Preventative health care 10/08/2012  . Psoriasis     Past Surgical History:  Procedure Laterality Date  . CARPAL TUNNEL RELEASE     RIGHT  . CHOLECYSTECTOMY  1994  . COLONOSCOPY    . HEMICOLECTOMY     12/17/2009 right  . POLYPECTOMY      Family History  Problem Relation Age of Onset    . Stomach cancer Mother        diedinher 91's  . Diabetes type II Brother        boderline  . Diabetes Brother        type II  . Alcohol abuse Brother   . Other Neg Hx        cad,prostate ca, colon ca  . Coronary artery disease Neg Hx   . Cancer Neg Hx        colon, prostate    Social History   Social History  . Marital status: Single    Spouse name: [name]  . Number of children: 0  . Years of education: N/A   Occupational History  . Chartered certified accountant and fax   Social History Main Topics  . Smoking status: Current Some Day Smoker    Types: Cigarettes  . Smokeless tobacco: Never Used  . Alcohol use No  . Drug use: No  . Sexual activity: Yes     Comment: lives with girlfriend, no dietary restrictions   Other Topics Concern  . Not on file   Social History Narrative   Occupation: Merchant navy officer (Recruitment consultant)   Single  (lives with [place])   no children  former smoker   Illicit Drug Use - no     Outpatient Medications Prior to Visit  Medication Sig Dispense Refill  . atorvastatin (LIPITOR) 20 MG tablet Take 1 tablet (20 mg total) by mouth daily. 90 tablet 3  . Blood Glucose Monitoring Suppl (ONETOUCH VERIO) W/DEVICE KIT   0  . Clobetasol Prop Emollient Base 0.05 % emollient cream Apply topically daily as needed.      Marland Kitchen CLOBEX SPRAY 0.05 % external spray     . diazepam (VALIUM) 5 MG tablet Take 1 tablet (5 mg total) by mouth every 12 (twelve) hours. 60 tablet 1  . diazepam (VALIUM) 5 MG tablet Take 1 tablet (5 mg total) by mouth every 12 (twelve) hours as needed for anxiety. 60 tablet 1  . diazepam (VALIUM) 5 MG tablet TAKE 1 TABLET BY MOUTH EVERY 12 HOURS AS NEEDED FOR ANXIETY 60 tablet 1  . escitalopram (LEXAPRO) 10 MG tablet TAKE 1 TABLET (10 MG TOTAL) BY MOUTH DAILY. 90 tablet 0  . fenofibrate 160 MG tablet Take 1 tablet (160 mg total) by mouth daily. 90 tablet 3  . glimepiride (AMARYL) 1 MG tablet TAKE 1 TABLET BY MOUTH EVERY DAY BEFORE BREAKFAST 90  tablet 2  . glucose blood (ONETOUCH VERIO) test strip Test three times daily, DX E11.9 300 each 6  . insulin aspart (NOVOLOG FLEXPEN) 100 UNIT/ML FlexPen INJECT 10-16 UNITS UNDER SKIN 3 TIMES A DAY BEFORE MEALS 15 pen 3  . Insulin Detemir (LEVEMIR FLEXTOUCH) 100 UNIT/ML Pen Inject 60 Units into the skin daily at 10 pm. 30 mL 3  . Insulin Glargine (LANTUS SOLOSTAR) 100 UNIT/ML Solostar Pen INJECT 60 UNITS SUBCUTANEOUS EVERY NIGHT AT 10PM **PER INSURANSE 3 BOXES FOR 90 DAYS** 45 pen 3  . Insulin Pen Needle (B-D ULTRAFINE III SHORT PEN) 31G X 8 MM MISC Test as directed three times daily.  DX E11.9 300 each 3  . lisinopril (PRINIVIL,ZESTRIL) 5 MG tablet TAKE 1 TABLET (5 MG TOTAL) BY MOUTH DAILY. 90 tablet 2  . losartan (COZAAR) 50 MG tablet TAKE 1 TABLET (50 MG TOTAL) BY MOUTH DAILY. 90 tablet 1  . metFORMIN (GLUCOPHAGE) 1000 MG tablet TAKE 1 TABLET (1,000 MG TOTAL) BY MOUTH 2 (TWO) TIMES DAILY WITH A MEAL. 180 tablet 2  . methocarbamol (ROBAXIN) 500 MG tablet TAKE 1 TABLET EVERY 8 HOURSAS NEEDED FOR MUSCLE SPASMS 180 tablet 1  . metoprolol succinate (TOPROL-XL) 25 MG 24 hr tablet TAKE 1 TABLET (25 MG TOTAL) BY MOUTH DAILY. 90 tablet 1  . omeprazole (PRILOSEC) 40 MG capsule Take 1 capsule (40 mg total) by mouth 2 (two) times daily. 180 capsule 3  . [place] LANCETS MISC     . oxyCODONE-acetaminophen (ROXICET) 5-325 MG tablet Take 1 tablet by mouth every 8 (eight) hours as needed for moderate pain or severe pain (Do not drive or operate heavy machinery while taking as can cause drowsiness.). 9 tablet 0  . sildenafil (REVATIO) 20 MG tablet 1-5 tabs po daily ED 90 tablet 1  . ustekinumab (STELARA) 90 MG/ML SOSY injection Inject 1 mL (90 mg total) into the skin every 3 (three) months.    . zolpidem (AMBIEN) 10 MG tablet TAKE 1 TABLET BY MOUTH AT BEDTIME AS NEEDED 90 tablet 0   No facility-administered medications prior to visit.     No Known Allergies  Review of Systems  Constitutional:  Negative for fever and malaise/fatigue.  HENT: Negative for congestion.   Eyes: Negative for blurred  vision.  Respiratory: Negative for shortness of breath.   Cardiovascular: Negative for chest pain, palpitations and leg swelling.  Gastrointestinal: Negative for abdominal pain, blood in stool and nausea.  Genitourinary: Negative for dysuria and frequency.  Musculoskeletal: Positive for joint pain. Negative for falls.  Skin: Negative for rash.  Neurological: Negative for dizziness, loss of consciousness and headaches.  Endo/Heme/Allergies: Negative for environmental allergies.  Psychiatric/Behavioral: Positive for depression. The patient is nervous/anxious.        Objective:    Physical Exam  Constitutional: He is oriented to person, place, and time. He appears well-developed and well-nourished. No distress.  HENT:  Head: Normocephalic and atraumatic.  Nose: Nose normal.  Eyes: Right eye exhibits no discharge. Left eye exhibits no discharge.  Neck: Normal range of motion. Neck supple.  Cardiovascular: Normal rate and regular rhythm.   No murmur heard. Pulmonary/Chest: Effort normal and breath sounds normal.  Abdominal: Soft. Bowel sounds are normal. There is no tenderness.  Musculoskeletal: Normal range of motion. He exhibits no edema or deformity.  Neurological: He is alert and oriented to person, place, and time.  Skin: Skin is warm and dry.  Psychiatric: He has a normal mood and affect.  Nursing note and vitals reviewed.   Resp 18   Ht _0  (1.727 m)   Wt 235 lb (106.6 kg)   SpO2 98%   BMI 35.73 kg/m  Wt Readings from Last 3 Encounters:  04/18/17 235 lb (106.6 kg)  09/30/16 244 lb 12.8 oz (111 kg)  06/22/16 235 lb 6.4 oz (106.8 kg)   BP Readings from Last 3 Encounters:  09/30/16 128/68  06/22/16 116/66  01/29/16 124/80     Immunization History  Administered Date(s) Administered  . Influenza Whole 03/20/2010  . Influenza,inj,Quad PF,6+ Mos 05/01/2015,  06/22/2016, 04/18/2017  . Pneumococcal Conjugate-13 04/18/2017  . Tdap 05/01/2015    Health Maintenance  Topic Date Due  . PNEUMOCOCCAL POLYSACCHARIDE VACCINE (1) 11/03/1964  . FOOT EXAM  11/03/1972  . OPHTHALMOLOGY EXAM  11/03/1972  . HIV Screening  11/03/1977  . COLONOSCOPY  06/22/2016  . HEMOGLOBIN A1C  04/02/2017  . INFLUENZA VACCINE  04/18/2018 (Originally 02/16/2017)  . TETANUS/TDAP  04/30/2025  . Hepatitis C Screening  Completed    Lab Results  Component Value Date   WBC 9.1 09/30/2016   HGB 14.3 09/30/2016   HCT 41.3 09/30/2016   PLT 297.0 09/30/2016   GLUCOSE 75 09/30/2016   CHOL 174 09/30/2016   TRIG 380.0 (H) 09/30/2016   HDL 28.50 (L) 09/30/2016   LDLDIRECT 62.0 09/30/2016   LDLCALC SEE COMMENT 10/27/2014   ALT 42 09/30/2016   AST 33 09/30/2016   NA 136 09/30/2016   K 3.5 09/30/2016   CL 107 09/30/2016   CREATININE 0.77 09/30/2016   BUN 10 09/30/2016   CO2 21 09/30/2016   TSH 3.21 09/30/2016   PSA 0.18 06/25/2016   INR 0.9 10/27/2014   HGBA1C 7.9 (H) 09/30/2016   MICROALBUR 0.50 01/08/2013    Lab Results  Component Value Date   TSH 3.21 09/30/2016   Lab Results  Component Value Date   WBC 9.1 09/30/2016   HGB 14.3 09/30/2016   HCT 41.3 09/30/2016   MCV 90.6 09/30/2016   PLT 297.0 09/30/2016   Lab Results  Component Value Date   NA 136 09/30/2016   K 3.5 09/30/2016   CO2 21 09/30/2016   GLUCOSE 75 09/30/2016   BUN 10 09/30/2016   CREATININE 0.77 09/30/2016   BILITOT  0.8 09/30/2016   ALKPHOS 51 09/30/2016   AST 33 09/30/2016   ALT 42 09/30/2016   PROT 6.9 09/30/2016   ALBUMIN 3.8 09/30/2016   CALCIUM 8.9 09/30/2016   ANIONGAP 4 (L) 10/30/2014   GFR 111.94 09/30/2016   Lab Results  Component Value Date   CHOL 174 09/30/2016   Lab Results  Component Value Date   HDL 28.50 (L) 09/30/2016   Lab Results  Component Value Date   LDLCALC SEE COMMENT 10/27/2014   Lab Results  Component Value Date   TRIG 380.0 (H) 09/30/2016   Lab  Results  Component Value Date   CHOLHDL 6 09/30/2016   Lab Results  Component Value Date   HGBA1C 7.9 (H) 09/30/2016         Assessment & Plan:   Problem List Items Addressed This Visit    Obesity    Encouraged DASH diet, decrease po intake and increase exercise as tolerated. Needs 7-8 hours of sleep nightly. Avoid trans fats, eat small, frequent meals every 4-5 hours with lean proteins, complex carbs and healthy fats. Minimize simple carbs, bariatric referral       Psoriasis     Skin Care he started Sterlara needs a Quantiferon check and other labs      Relevant Orders   Quantiferon tb gold assay (blood)   Diabetes mellitus type 2 in obese ([facility])    hgba1c acceptable, minimize simple carbs. Increase exercise as tolerated. Continue current meds. He has been eating badly and is very stressed      Relevant Orders   Hemoglobin A1c   Hyperlipidemia, mixed    Tolerating statin, encouraged heart healthy diet, avoid trans fats, minimize simple carbs and saturated fats. Increase exercise as tolerated      Relevant Orders   Lipid panel   Right knee pain    3 week history, try ice and Lidocaine if worse call      Preventative health care    Patient encouraged to maintain heart healthy diet, regular exercise, adequate sleep. Consider daily probiotics. Take medications as prescribed      Relevant Orders   PSA   RESOLVED: Anxiety associated with depression    Is under a great deal of stress with his engagement getting called off, increased stress at work. Diazepam helps with stress at times.      Essential hypertension    Well controlled, no changes to meds. Encouraged heart healthy diet such as the DASH diet and exercise as tolerated.       Relevant Orders   TSH   Comprehensive metabolic panel   CBC w/Diff   Hypotestosteronism    Check level      Great toe pain, right    Check uric acid level, start Meloxicam qd x 5 days then as needed. Norco prn for 5  days      Relevant Medications   HYDROcodone-acetaminophen (NORCO) 5-325 MG tablet   Other Relevant Orders   Uric acid   Pain Mgmt, Profile 8 w/Conf, U    Other Visit Diagnoses    Long term use of drug    -  Primary   Relevant Orders   Pain Mgmt, Profile 8 w/Conf, U   Need for influenza vaccination       Relevant Orders   Flu Vaccine QUAD 6+ mos PF IM (Fluarix Quad PF) (Completed)      I am having Mr. Nine start on meloxicam and HYDROcodone-acetaminophen. I am also having him maintain his  Clobetasol Prop Emollient Base, ONETOUCH [place] LANCETS, CLOBEX SPRAY, ONETOUCH VERIO, glucose blood, Insulin Pen Needle, Insulin Glargine, methocarbamol, oxyCODONE-acetaminophen, ustekinumab, lisinopril, losartan, sildenafil, atorvastatin, fenofibrate, omeprazole, metFORMIN, metoprolol succinate, diazepam, diazepam, Insulin Detemir, insulin aspart, diazepam, glimepiride, zolpidem, and escitalopram.  Meds ordered this encounter  Medications  . meloxicam (MOBIC) 15 MG tablet    Sig: Take 1 tablet (15 mg total) by mouth daily as needed for pain.    Dispense:  30 tablet    Refill:  1  . HYDROcodone-acetaminophen (NORCO) 5-325 MG tablet    Sig: Take 1 tablet by mouth every 6 (six) hours as needed for moderate pain.    Dispense:  15 tablet    Refill:  0    CMA served as scribe during this visit. History, Physical and Plan performed by medical provider. Documentation and orders reviewed and attested to.  Penni Homans, MD

## 2017-04-18 NOTE — Assessment & Plan Note (Addendum)
Check uric acid level, start Meloxicam qd x 5 days then as needed. Norco prn for 5 days

## 2017-04-19 MED ORDER — ATORVASTATIN CALCIUM 20 MG PO TABS: 20.0000 mg | ORAL_TABLET | Freq: Every day | ORAL | 3 refills | Status: DC

## 2017-04-19 NOTE — Addendum Note (Signed)
Addended by: Magdalene Molly A on: 04/19/2017 03:55 PM   Modules accepted: Orders

## 2017-04-22 LAB — PAIN MGMT, PROFILE 8 W/CONF, U
6 Acetylmorphine: NEGATIVE ng/mL (ref <10)
Alcohol Metabolites: POSITIVE ng/mL — AB (ref <500)
Alphahydroxyalprazolam: NEGATIVE ng/mL (ref <25)
Alphahydroxymidazolam: NEGATIVE ng/mL (ref <50)
Alphahydroxytriazolam: NEGATIVE ng/mL (ref <50)
Aminoclonazepam: NEGATIVE ng/mL (ref <25)
Amphetamines: NEGATIVE ng/mL (ref <500)
Benzodiazepines: POSITIVE ng/mL — AB (ref <100)
Buprenorphine, Urine: NEGATIVE ng/mL (ref <5)
CREATININE: 164.2 mg/dL
Cocaine Metabolite: NEGATIVE ng/mL (ref <150)
ETHYL GLUCURONIDE (ETG): 12311 ng/mL — AB (ref <500)
Ethyl Sulfate (ETS): 1763 ng/mL — ABNORMAL HIGH (ref <100)
HYDROXYETHYLFLURAZEPAM: NEGATIVE ng/mL (ref <50)
Lorazepam: NEGATIVE ng/mL (ref <50)
MARIJUANA METABOLITE: NEGATIVE ng/mL (ref <20)
MDMA: NEGATIVE ng/mL (ref <500)
NORDIAZEPAM: 339 ng/mL — AB (ref <50)
OPIATES: NEGATIVE ng/mL (ref <100)
OXAZEPAM: 568 ng/mL — AB (ref <50)
Oxidant: NEGATIVE ug/mL (ref <200)
Oxycodone: NEGATIVE ng/mL (ref <100)
Temazepam: 419 ng/mL — ABNORMAL HIGH (ref <50)
pH: 6.68 (ref 4.5–9.0)

## 2017-04-22 LAB — QUANTIFERON TB GOLD ASSAY (BLOOD)
QUANTIFERON TB AG MINUS NIL: 0.01 [IU]/mL
QUANTIFERON(R)-TB GOLD: NEGATIVE
Quantiferon Nil Value: 0.02 IU/mL

## 2017-04-26 DIAGNOSIS — M62830 Muscle spasm of back: Secondary | ICD-10-CM | POA: Diagnosis not present

## 2017-04-26 DIAGNOSIS — M9902 Segmental and somatic dysfunction of thoracic region: Secondary | ICD-10-CM | POA: Diagnosis not present

## 2017-04-26 DIAGNOSIS — M9901 Segmental and somatic dysfunction of cervical region: Secondary | ICD-10-CM | POA: Diagnosis not present

## 2017-04-26 DIAGNOSIS — M5033 Other cervical disc degeneration, cervicothoracic region: Secondary | ICD-10-CM | POA: Diagnosis not present

## 2017-04-28 DIAGNOSIS — M5033 Other cervical disc degeneration, cervicothoracic region: Secondary | ICD-10-CM | POA: Diagnosis not present

## 2017-04-28 DIAGNOSIS — H52223 Regular astigmatism, bilateral: Secondary | ICD-10-CM | POA: Diagnosis not present

## 2017-04-28 DIAGNOSIS — H524 Presbyopia: Secondary | ICD-10-CM | POA: Diagnosis not present

## 2017-04-28 DIAGNOSIS — H5203 Hypermetropia, bilateral: Secondary | ICD-10-CM | POA: Diagnosis not present

## 2017-04-28 DIAGNOSIS — M9901 Segmental and somatic dysfunction of cervical region: Secondary | ICD-10-CM | POA: Diagnosis not present

## 2017-04-28 DIAGNOSIS — M62830 Muscle spasm of back: Secondary | ICD-10-CM | POA: Diagnosis not present

## 2017-04-28 DIAGNOSIS — M9902 Segmental and somatic dysfunction of thoracic region: Secondary | ICD-10-CM | POA: Diagnosis not present

## 2017-05-02 DIAGNOSIS — M9901 Segmental and somatic dysfunction of cervical region: Secondary | ICD-10-CM | POA: Diagnosis not present

## 2017-05-02 DIAGNOSIS — M9902 Segmental and somatic dysfunction of thoracic region: Secondary | ICD-10-CM | POA: Diagnosis not present

## 2017-05-02 DIAGNOSIS — M5033 Other cervical disc degeneration, cervicothoracic region: Secondary | ICD-10-CM | POA: Diagnosis not present

## 2017-05-02 DIAGNOSIS — M62830 Muscle spasm of back: Secondary | ICD-10-CM | POA: Diagnosis not present

## 2017-05-03 DIAGNOSIS — M5033 Other cervical disc degeneration, cervicothoracic region: Secondary | ICD-10-CM | POA: Diagnosis not present

## 2017-05-03 DIAGNOSIS — M62830 Muscle spasm of back: Secondary | ICD-10-CM | POA: Diagnosis not present

## 2017-05-03 DIAGNOSIS — M9902 Segmental and somatic dysfunction of thoracic region: Secondary | ICD-10-CM | POA: Diagnosis not present

## 2017-05-03 DIAGNOSIS — M9901 Segmental and somatic dysfunction of cervical region: Secondary | ICD-10-CM | POA: Diagnosis not present

## 2017-05-04 DIAGNOSIS — M9901 Segmental and somatic dysfunction of cervical region: Secondary | ICD-10-CM | POA: Diagnosis not present

## 2017-05-04 DIAGNOSIS — M62830 Muscle spasm of back: Secondary | ICD-10-CM | POA: Diagnosis not present

## 2017-05-04 DIAGNOSIS — M9902 Segmental and somatic dysfunction of thoracic region: Secondary | ICD-10-CM | POA: Diagnosis not present

## 2017-05-04 DIAGNOSIS — M5033 Other cervical disc degeneration, cervicothoracic region: Secondary | ICD-10-CM | POA: Diagnosis not present

## 2017-05-09 DIAGNOSIS — M9902 Segmental and somatic dysfunction of thoracic region: Secondary | ICD-10-CM | POA: Diagnosis not present

## 2017-05-09 DIAGNOSIS — M5033 Other cervical disc degeneration, cervicothoracic region: Secondary | ICD-10-CM | POA: Diagnosis not present

## 2017-05-09 DIAGNOSIS — M9901 Segmental and somatic dysfunction of cervical region: Secondary | ICD-10-CM | POA: Diagnosis not present

## 2017-05-09 DIAGNOSIS — M62830 Muscle spasm of back: Secondary | ICD-10-CM | POA: Diagnosis not present

## 2017-05-11 DIAGNOSIS — M9901 Segmental and somatic dysfunction of cervical region: Secondary | ICD-10-CM | POA: Diagnosis not present

## 2017-05-11 DIAGNOSIS — M5033 Other cervical disc degeneration, cervicothoracic region: Secondary | ICD-10-CM | POA: Diagnosis not present

## 2017-05-11 DIAGNOSIS — M9902 Segmental and somatic dysfunction of thoracic region: Secondary | ICD-10-CM | POA: Diagnosis not present

## 2017-05-11 DIAGNOSIS — M62830 Muscle spasm of back: Secondary | ICD-10-CM | POA: Diagnosis not present

## 2017-05-13 DIAGNOSIS — M9901 Segmental and somatic dysfunction of cervical region: Secondary | ICD-10-CM | POA: Diagnosis not present

## 2017-05-13 DIAGNOSIS — M5033 Other cervical disc degeneration, cervicothoracic region: Secondary | ICD-10-CM | POA: Diagnosis not present

## 2017-05-13 DIAGNOSIS — M62830 Muscle spasm of back: Secondary | ICD-10-CM | POA: Diagnosis not present

## 2017-05-13 DIAGNOSIS — M9902 Segmental and somatic dysfunction of thoracic region: Secondary | ICD-10-CM | POA: Diagnosis not present

## 2017-05-16 DIAGNOSIS — M9901 Segmental and somatic dysfunction of cervical region: Secondary | ICD-10-CM | POA: Diagnosis not present

## 2017-05-16 DIAGNOSIS — M9902 Segmental and somatic dysfunction of thoracic region: Secondary | ICD-10-CM | POA: Diagnosis not present

## 2017-05-16 DIAGNOSIS — M5033 Other cervical disc degeneration, cervicothoracic region: Secondary | ICD-10-CM | POA: Diagnosis not present

## 2017-05-16 DIAGNOSIS — M62830 Muscle spasm of back: Secondary | ICD-10-CM | POA: Diagnosis not present

## 2017-05-18 DIAGNOSIS — M9902 Segmental and somatic dysfunction of thoracic region: Secondary | ICD-10-CM | POA: Diagnosis not present

## 2017-05-18 DIAGNOSIS — M9901 Segmental and somatic dysfunction of cervical region: Secondary | ICD-10-CM | POA: Diagnosis not present

## 2017-05-18 DIAGNOSIS — M5033 Other cervical disc degeneration, cervicothoracic region: Secondary | ICD-10-CM | POA: Diagnosis not present

## 2017-05-18 DIAGNOSIS — M62830 Muscle spasm of back: Secondary | ICD-10-CM | POA: Diagnosis not present

## 2017-05-20 DIAGNOSIS — M9902 Segmental and somatic dysfunction of thoracic region: Secondary | ICD-10-CM | POA: Diagnosis not present

## 2017-05-20 DIAGNOSIS — M5033 Other cervical disc degeneration, cervicothoracic region: Secondary | ICD-10-CM | POA: Diagnosis not present

## 2017-05-20 DIAGNOSIS — M62830 Muscle spasm of back: Secondary | ICD-10-CM | POA: Diagnosis not present

## 2017-05-20 DIAGNOSIS — M9901 Segmental and somatic dysfunction of cervical region: Secondary | ICD-10-CM | POA: Diagnosis not present

## 2017-05-23 DIAGNOSIS — M62830 Muscle spasm of back: Secondary | ICD-10-CM | POA: Diagnosis not present

## 2017-05-23 DIAGNOSIS — M5033 Other cervical disc degeneration, cervicothoracic region: Secondary | ICD-10-CM | POA: Diagnosis not present

## 2017-05-23 DIAGNOSIS — M9902 Segmental and somatic dysfunction of thoracic region: Secondary | ICD-10-CM | POA: Diagnosis not present

## 2017-05-23 DIAGNOSIS — M9901 Segmental and somatic dysfunction of cervical region: Secondary | ICD-10-CM | POA: Diagnosis not present

## 2017-05-25 DIAGNOSIS — M62830 Muscle spasm of back: Secondary | ICD-10-CM | POA: Diagnosis not present

## 2017-05-25 DIAGNOSIS — M9901 Segmental and somatic dysfunction of cervical region: Secondary | ICD-10-CM | POA: Diagnosis not present

## 2017-05-25 DIAGNOSIS — M5033 Other cervical disc degeneration, cervicothoracic region: Secondary | ICD-10-CM | POA: Diagnosis not present

## 2017-05-25 DIAGNOSIS — M9902 Segmental and somatic dysfunction of thoracic region: Secondary | ICD-10-CM | POA: Diagnosis not present

## 2017-05-27 DIAGNOSIS — M62830 Muscle spasm of back: Secondary | ICD-10-CM | POA: Diagnosis not present

## 2017-05-27 DIAGNOSIS — M9902 Segmental and somatic dysfunction of thoracic region: Secondary | ICD-10-CM | POA: Diagnosis not present

## 2017-05-27 DIAGNOSIS — M9901 Segmental and somatic dysfunction of cervical region: Secondary | ICD-10-CM | POA: Diagnosis not present

## 2017-05-27 DIAGNOSIS — M5033 Other cervical disc degeneration, cervicothoracic region: Secondary | ICD-10-CM | POA: Diagnosis not present

## 2017-05-30 DIAGNOSIS — M9902 Segmental and somatic dysfunction of thoracic region: Secondary | ICD-10-CM | POA: Diagnosis not present

## 2017-05-30 DIAGNOSIS — M9901 Segmental and somatic dysfunction of cervical region: Secondary | ICD-10-CM | POA: Diagnosis not present

## 2017-05-30 DIAGNOSIS — M62830 Muscle spasm of back: Secondary | ICD-10-CM | POA: Diagnosis not present

## 2017-05-30 DIAGNOSIS — M5033 Other cervical disc degeneration, cervicothoracic region: Secondary | ICD-10-CM | POA: Diagnosis not present

## 2017-06-01 DIAGNOSIS — M9901 Segmental and somatic dysfunction of cervical region: Secondary | ICD-10-CM | POA: Diagnosis not present

## 2017-06-01 DIAGNOSIS — M62830 Muscle spasm of back: Secondary | ICD-10-CM | POA: Diagnosis not present

## 2017-06-01 DIAGNOSIS — M5033 Other cervical disc degeneration, cervicothoracic region: Secondary | ICD-10-CM | POA: Diagnosis not present

## 2017-06-01 DIAGNOSIS — M9902 Segmental and somatic dysfunction of thoracic region: Secondary | ICD-10-CM | POA: Diagnosis not present

## 2017-06-03 DIAGNOSIS — M9902 Segmental and somatic dysfunction of thoracic region: Secondary | ICD-10-CM | POA: Diagnosis not present

## 2017-06-03 DIAGNOSIS — M9901 Segmental and somatic dysfunction of cervical region: Secondary | ICD-10-CM | POA: Diagnosis not present

## 2017-06-03 DIAGNOSIS — M62830 Muscle spasm of back: Secondary | ICD-10-CM | POA: Diagnosis not present

## 2017-06-03 DIAGNOSIS — M5033 Other cervical disc degeneration, cervicothoracic region: Secondary | ICD-10-CM | POA: Diagnosis not present

## 2017-06-06 DIAGNOSIS — M9901 Segmental and somatic dysfunction of cervical region: Secondary | ICD-10-CM | POA: Diagnosis not present

## 2017-06-06 DIAGNOSIS — M62830 Muscle spasm of back: Secondary | ICD-10-CM | POA: Diagnosis not present

## 2017-06-06 DIAGNOSIS — M5033 Other cervical disc degeneration, cervicothoracic region: Secondary | ICD-10-CM | POA: Diagnosis not present

## 2017-06-06 DIAGNOSIS — M9902 Segmental and somatic dysfunction of thoracic region: Secondary | ICD-10-CM | POA: Diagnosis not present

## 2017-06-07 DIAGNOSIS — M9902 Segmental and somatic dysfunction of thoracic region: Secondary | ICD-10-CM | POA: Diagnosis not present

## 2017-06-07 DIAGNOSIS — M9901 Segmental and somatic dysfunction of cervical region: Secondary | ICD-10-CM | POA: Diagnosis not present

## 2017-06-07 DIAGNOSIS — M5033 Other cervical disc degeneration, cervicothoracic region: Secondary | ICD-10-CM | POA: Diagnosis not present

## 2017-06-07 DIAGNOSIS — M62830 Muscle spasm of back: Secondary | ICD-10-CM | POA: Diagnosis not present

## 2017-06-13 DIAGNOSIS — M62830 Muscle spasm of back: Secondary | ICD-10-CM | POA: Diagnosis not present

## 2017-06-13 DIAGNOSIS — M9901 Segmental and somatic dysfunction of cervical region: Secondary | ICD-10-CM | POA: Diagnosis not present

## 2017-06-13 DIAGNOSIS — M5033 Other cervical disc degeneration, cervicothoracic region: Secondary | ICD-10-CM | POA: Diagnosis not present

## 2017-06-13 DIAGNOSIS — M9902 Segmental and somatic dysfunction of thoracic region: Secondary | ICD-10-CM | POA: Diagnosis not present

## 2017-06-15 DIAGNOSIS — M9901 Segmental and somatic dysfunction of cervical region: Secondary | ICD-10-CM | POA: Diagnosis not present

## 2017-06-15 DIAGNOSIS — M9902 Segmental and somatic dysfunction of thoracic region: Secondary | ICD-10-CM | POA: Diagnosis not present

## 2017-06-15 DIAGNOSIS — M5033 Other cervical disc degeneration, cervicothoracic region: Secondary | ICD-10-CM | POA: Diagnosis not present

## 2017-06-15 DIAGNOSIS — M62830 Muscle spasm of back: Secondary | ICD-10-CM | POA: Diagnosis not present

## 2017-06-16 DIAGNOSIS — M62830 Muscle spasm of back: Secondary | ICD-10-CM | POA: Diagnosis not present

## 2017-06-16 DIAGNOSIS — M5033 Other cervical disc degeneration, cervicothoracic region: Secondary | ICD-10-CM | POA: Diagnosis not present

## 2017-06-16 DIAGNOSIS — M9901 Segmental and somatic dysfunction of cervical region: Secondary | ICD-10-CM | POA: Diagnosis not present

## 2017-06-16 DIAGNOSIS — M9902 Segmental and somatic dysfunction of thoracic region: Secondary | ICD-10-CM | POA: Diagnosis not present

## 2017-06-17 DIAGNOSIS — M62830 Muscle spasm of back: Secondary | ICD-10-CM | POA: Diagnosis not present

## 2017-06-17 DIAGNOSIS — M9902 Segmental and somatic dysfunction of thoracic region: Secondary | ICD-10-CM | POA: Diagnosis not present

## 2017-06-17 DIAGNOSIS — M5033 Other cervical disc degeneration, cervicothoracic region: Secondary | ICD-10-CM | POA: Diagnosis not present

## 2017-06-17 DIAGNOSIS — M9901 Segmental and somatic dysfunction of cervical region: Secondary | ICD-10-CM | POA: Diagnosis not present

## 2017-06-18 DIAGNOSIS — M9902 Segmental and somatic dysfunction of thoracic region: Secondary | ICD-10-CM | POA: Diagnosis not present

## 2017-06-18 DIAGNOSIS — M62830 Muscle spasm of back: Secondary | ICD-10-CM | POA: Diagnosis not present

## 2017-06-18 DIAGNOSIS — M5033 Other cervical disc degeneration, cervicothoracic region: Secondary | ICD-10-CM | POA: Diagnosis not present

## 2017-06-18 DIAGNOSIS — M9901 Segmental and somatic dysfunction of cervical region: Secondary | ICD-10-CM | POA: Diagnosis not present

## 2017-06-20 DIAGNOSIS — M9902 Segmental and somatic dysfunction of thoracic region: Secondary | ICD-10-CM | POA: Diagnosis not present

## 2017-06-20 DIAGNOSIS — M62830 Muscle spasm of back: Secondary | ICD-10-CM | POA: Diagnosis not present

## 2017-06-20 DIAGNOSIS — M5033 Other cervical disc degeneration, cervicothoracic region: Secondary | ICD-10-CM | POA: Diagnosis not present

## 2017-06-20 DIAGNOSIS — M9901 Segmental and somatic dysfunction of cervical region: Secondary | ICD-10-CM | POA: Diagnosis not present

## 2017-06-21 DIAGNOSIS — M62830 Muscle spasm of back: Secondary | ICD-10-CM | POA: Diagnosis not present

## 2017-06-21 DIAGNOSIS — M5033 Other cervical disc degeneration, cervicothoracic region: Secondary | ICD-10-CM | POA: Diagnosis not present

## 2017-06-21 DIAGNOSIS — M9902 Segmental and somatic dysfunction of thoracic region: Secondary | ICD-10-CM | POA: Diagnosis not present

## 2017-06-21 DIAGNOSIS — M9901 Segmental and somatic dysfunction of cervical region: Secondary | ICD-10-CM | POA: Diagnosis not present

## 2017-06-23 ENCOUNTER — Other Ambulatory Visit: Payer: Self-pay | Admitting: Family Medicine

## 2017-06-23 ENCOUNTER — Encounter: Payer: Self-pay | Admitting: Medical

## 2017-06-23 ENCOUNTER — Ambulatory Visit (INDEPENDENT_AMBULATORY_CARE_PROVIDER_SITE_OTHER): Payer: BLUE CROSS/BLUE SHIELD | Admitting: Medical

## 2017-06-23 ENCOUNTER — Ambulatory Visit (HOSPITAL_BASED_OUTPATIENT_CLINIC_OR_DEPARTMENT_OTHER)
Admission: RE | Admit: 2017-06-23 | Discharge: 2017-06-23 | Disposition: A | Discharge status: Home / Self Care | Payer: BLUE CROSS/BLUE SHIELD | Source: Ambulatory Visit | Attending: Medical | Admitting: Medical

## 2017-06-23 VITALS — BP 150/80 | HR 84 | Temp 98.3°F | Resp 16 | Ht 68.0 in | Wt 227.0 lb

## 2017-06-23 DIAGNOSIS — M9901 Segmental and somatic dysfunction of cervical region: Secondary | ICD-10-CM | POA: Diagnosis not present

## 2017-06-23 DIAGNOSIS — M1711 Unilateral primary osteoarthritis, right knee: Secondary | ICD-10-CM | POA: Insufficient documentation

## 2017-06-23 DIAGNOSIS — M25861 Other specified joint disorders, right knee: Secondary | ICD-10-CM | POA: Diagnosis not present

## 2017-06-23 DIAGNOSIS — M9902 Segmental and somatic dysfunction of thoracic region: Secondary | ICD-10-CM | POA: Diagnosis not present

## 2017-06-23 DIAGNOSIS — M25561 Pain in right knee: Secondary | ICD-10-CM | POA: Diagnosis not present

## 2017-06-23 DIAGNOSIS — M179 Osteoarthritis of knee, unspecified: Secondary | ICD-10-CM | POA: Diagnosis not present

## 2017-06-23 DIAGNOSIS — M5033 Other cervical disc degeneration, cervicothoracic region: Secondary | ICD-10-CM | POA: Diagnosis not present

## 2017-06-23 DIAGNOSIS — M62830 Muscle spasm of back: Secondary | ICD-10-CM | POA: Diagnosis not present

## 2017-06-23 DIAGNOSIS — M79644 Pain in right finger(s): Secondary | ICD-10-CM

## 2017-06-23 LAB — CBC WITH DIFFERENTIAL/PLATELET
BASOS PCT: 0.6 % (ref 0.0–3.0)
Basophils Absolute: 0.1 10*3/uL (ref 0.0–0.1)
EOS ABS: 0.1 10*3/uL (ref 0.0–0.7)
Eosinophils Relative: 1.1 % (ref 0.0–5.0)
HCT: 41.2 % (ref 39.0–52.0)
HEMOGLOBIN: 14.1 g/dL (ref 13.0–17.0)
Lymphocytes Relative: 18.4 % (ref 12.0–46.0)
Lymphs Abs: 2.4 10*3/uL (ref 0.7–4.0)
MCHC: 34.2 g/dL (ref 30.0–36.0)
MCV: 90 fl (ref 78.0–100.0)
MONO ABS: 0.9 10*3/uL (ref 0.1–1.0)
Monocytes Relative: 6.9 % (ref 3.0–12.0)
NEUTROS PCT: 73 % (ref 43.0–77.0)
Neutro Abs: 9.5 10*3/uL — ABNORMAL HIGH (ref 1.4–7.7)
Platelets: 362 10*3/uL (ref 150.0–400.0)
RBC: 4.57 Mil/uL (ref 4.22–5.81)
RDW: 12.1 % (ref 11.5–15.5)
WBC: 13 10*3/uL — AB (ref 4.0–10.5)

## 2017-06-23 LAB — URIC ACID: URIC ACID, SERUM: 4.1 mg/dL (ref 4.0–7.8)

## 2017-06-23 MED ORDER — KETOROLAC TROMETHAMINE 30 MG/ML IJ SOLN
30.0000 mg | Freq: Once | INTRAMUSCULAR | Status: AC
  Administered 2017-06-23: 30 mg via INTRAMUSCULAR

## 2017-06-23 MED ORDER — HYDROCODONE-ACETAMINOPHEN 5-325 MG PO TABS: 1.0000 | ORAL_TABLET | Freq: Four times a day (QID) | ORAL | 0 refills | Status: DC | PRN

## 2017-06-23 MED ORDER — DOXYCYCLINE HYCLATE 100 MG PO TABS: 100.0000 mg | ORAL_TABLET | Freq: Two times a day (BID) | ORAL | 0 refills | Status: DC

## 2017-06-23 NOTE — Patient Instructions (Addendum)
For your thumb and knee pain will get xrays of both areas and labs(uric acid and cbc).  Today will give low dose toradol 30 mg im. Follow xray and labs. If uric elevation is elevated would rx colchicine.   Brief course of norco given to help with pain.  Heading toward weekend and snow storm may occur. In event thumb changes red, swollen more or warmth can start doxycycine(print rx given) Doubt any infection presently but given in event changes consistent with infection  Your bp is mild elevated today. Maybe related to your pain. Need to follow.  Follow up in 5 days or as needed

## 2017-06-23 NOTE — Addendum Note (Signed)
Addended by: Hinton Dyer on: 06/23/2017 12:56 PM   Modules accepted: Orders

## 2017-06-23 NOTE — Progress Notes (Signed)
 Subjective:    Patient ID: [name], male    DOB: 11/20/1960, 56 y.o.   MRN: [id number]  HPI  Pt in with some rt thumb pain for weeks. He states pain is getting worse. He states pain in thumb. Pt has pain on rt thumb DIP joint. Swelling has gradually worsened. Pt no trauma or fall. Pt is a service technician. Rt handed. Pt has seen chiropracter for this but not getting better.  Pt states constant 5/10 pain at times increases.(he request something to relieve pain)   He also reports some rt knee pain for 2 months. Pain in knee is less painful than thumb. Pain in knee has been gradually decreasing.     Review of Systems  Constitutional: Negative for chills, fatigue and fever.  Respiratory: Negative for cough, chest tightness, shortness of breath and wheezing.   Cardiovascular: Negative for chest pain and palpitations.  Gastrointestinal: Negative for abdominal pain.  Musculoskeletal: Negative for back pain, joint swelling and neck pain.       Rt thumb pain and rt knee pain.  Skin: Negative for rash.  Hematological: Negative for adenopathy. Does not bruise/bleed easily.  Psychiatric/Behavioral: Negative for confusion.    Past Medical History:  Diagnosis Date  . Anxiety associated with depression 10/11/2013  . Colon cancer ([facility])    right colon cancer- adenocarcinoma CEA level isnrmal at 1.8  . Diabetes mellitus   . Erectile dysfunction 06/22/2016  . Great toe pain, right 04/18/2017  . Hepatic artery stenosis ([facility])   . Hyperlipidemia   . Hypertension   . Hypertriglyceridemia 12/14/2010  . Iron deficiency anemia 2011  . Low testosterone 06/22/2016  . Nipple pain 09/30/2016  . Obesity   . Preventative health care 10/08/2012  . Psoriasis      Social History   Socioeconomic History  . Marital status: Single    Spouse name: Toni  . Number of children: 0  . Years of education: Not on file  . Highest education level: Not on file  Social Needs  . Financial resource strain:  Not on file  . Food insecurity - worry: Not on file  . Food insecurity - inability: Not on file  . Transportation needs - medical: Not on file  . Transportation needs - non-medical: Not on file  Occupational History  . Occupation: Technician    Comment: printers and fax  Tobacco Use  . Smoking status: Current Some Day Smoker    Types: Cigarettes  . Smokeless tobacco: Never Used  Substance and Sexual Activity  . Alcohol use: No  . Drug use: No  . Sexual activity: Yes    Comment: lives with girlfriend, no dietary restrictions  Other Topics Concern  . Not on file  Social History Narrative   Occupation: Technician (printers and fax)   Single  (lives with Toni)   no children    former smoker   Illicit Drug Use - no     Past Surgical History:  Procedure Laterality Date  . CARPAL TUNNEL RELEASE     RIGHT  . CHOLECYSTECTOMY  1994  . COLONOSCOPY    . HEMICOLECTOMY     12/17/2009 right  . POLYPECTOMY      Family History  Problem Relation Age of Onset  . Stomach cancer Mother        diedinher 60's  . Diabetes type II Brother        boderline  . Diabetes Brother        type II  .   Alcohol abuse Brother   . Other Neg Hx        cad,prostate ca, colon ca  . Coronary artery disease Neg Hx   . Cancer Neg Hx        colon, prostate    No Known Allergies  Current Outpatient Medications on File Prior to Visit  Medication Sig Dispense Refill  . atorvastatin (LIPITOR) 20 MG tablet Take 1 tablet (20 mg total) by mouth daily. 90 tablet 3  . Blood Glucose Monitoring Suppl (ONETOUCH VERIO) W/DEVICE KIT   0  . Clobetasol Prop Emollient Base 0.05 % emollient cream Apply topically daily as needed.      Marland Kitchen CLOBEX SPRAY 0.05 % external spray     . diazepam (VALIUM) 5 MG tablet Take 1 tablet (5 mg total) by mouth every 12 (twelve) hours. 60 tablet 1  . diazepam (VALIUM) 5 MG tablet Take 1 tablet (5 mg total) by mouth every 12 (twelve) hours as needed for anxiety. 60 tablet 1  .  escitalopram (LEXAPRO) 10 MG tablet TAKE 1 TABLET (10 MG TOTAL) BY MOUTH DAILY. 90 tablet 0  . fenofibrate 160 MG tablet Take 1 tablet (160 mg total) by mouth daily. 90 tablet 3  . glimepiride (AMARYL) 1 MG tablet TAKE 1 TABLET BY MOUTH EVERY DAY BEFORE BREAKFAST 90 tablet 2  . glucose blood (ONETOUCH VERIO) test strip Test three times daily, DX E11.9 300 each 6  . insulin aspart (NOVOLOG FLEXPEN) 100 UNIT/ML FlexPen INJECT 10-16 UNITS UNDER SKIN 3 TIMES A DAY BEFORE MEALS 15 pen 3  . Insulin Detemir (LEVEMIR FLEXTOUCH) 100 UNIT/ML Pen Inject 60 Units into the skin daily at 10 pm. 30 mL 3  . Insulin Pen Needle (B-D ULTRAFINE III SHORT PEN) 31G X 8 MM MISC Test as directed three times daily.  DX E11.9 300 each 3  . lisinopril (PRINIVIL,ZESTRIL) 5 MG tablet TAKE 1 TABLET (5 MG TOTAL) BY MOUTH DAILY. 90 tablet 2  . losartan (COZAAR) 50 MG tablet TAKE 1 TABLET (50 MG TOTAL) BY MOUTH DAILY. 90 tablet 1  . metFORMIN (GLUCOPHAGE) 1000 MG tablet TAKE 1 TABLET (1,000 MG TOTAL) BY MOUTH 2 (TWO) TIMES DAILY WITH A MEAL. 180 tablet 2  . metoprolol succinate (TOPROL-XL) 25 MG 24 hr tablet TAKE 1 TABLET (25 MG TOTAL) BY MOUTH DAILY. 90 tablet 1  . omeprazole (PRILOSEC) 40 MG capsule Take 1 capsule (40 mg total) by mouth 2 (two) times daily. 180 capsule 3  . [place] LANCETS MISC     . sildenafil (REVATIO) 20 MG tablet 1-5 tabs po daily ED 90 tablet 1  . ustekinumab (STELARA) 90 MG/ML SOSY injection Inject 1 mL (90 mg total) into the skin every 3 (three) months.    . zolpidem (AMBIEN) 10 MG tablet TAKE 1 TABLET BY MOUTH AT BEDTIME AS NEEDED 90 tablet 0  . [DISCONTINUED] traZODone (DESYREL) 50 MG tablet Take 1 tablet (50 mg total) by mouth at bedtime as needed. 30 tablet 5   No current facility-administered medications on file prior to visit.     BP (!) 182/64 (BP Location: Right Arm, Patient Position: Sitting, Cuff Size: Large)   Pulse 84   Temp 98.3 F (36.8 C) (Oral)   Resp 16   Ht 5' 8" (1.727  m)   Wt 227 lb (103 kg)   SpO2 97%   BMI 34.52 kg/m   Checked bp twice and 150/80.     Objective:   Physical Exam  General- No acute distress. Pleasant patient. Neck- Full range of motion, no jvd Lungs- Clear, even and unlabored. Heart- regular rate and rhythm. Neurologic- CNII- XII grossly intact.   Rt thumb- some slight limited range of motion. The DIP joint is mild swollen. Not red or warm.   Rt knee- medial aspect tenderness to palpation.      Assessment & Plan:  For your thumb and knee pain will get xrays of both areas and labs(uric acid and cbc).  Today will give low dose toradol 30 mg im. Follow xray and labs. If uric elevation is elevated would rx colchicine.   Brief course of norco given to help with pain.  Heading toward weekend and snow storm may occur. In event thumb changes red, swollen more or warmth can start doxycycine(print rx given)  Your bp is mild elevated today. Maybe related to your pain. Need to follow.  Follow up in 5 days or as needed  Alvia Tory, Percell Miller, [employer]

## 2017-06-24 ENCOUNTER — Telehealth: Payer: Self-pay | Admitting: Family Medicine

## 2017-06-24 ENCOUNTER — Other Ambulatory Visit: Payer: Self-pay | Admitting: Family Medicine

## 2017-06-24 ENCOUNTER — Other Ambulatory Visit: Payer: BLUE CROSS/BLUE SHIELD

## 2017-06-24 ENCOUNTER — Other Ambulatory Visit (INDEPENDENT_AMBULATORY_CARE_PROVIDER_SITE_OTHER): Payer: BLUE CROSS/BLUE SHIELD

## 2017-06-24 ENCOUNTER — Telehealth: Payer: Self-pay | Admitting: Medical

## 2017-06-24 ENCOUNTER — Encounter: Payer: Self-pay | Admitting: Family Medicine

## 2017-06-24 DIAGNOSIS — Z79899 Other long term (current) drug therapy: Secondary | ICD-10-CM

## 2017-06-24 DIAGNOSIS — M2550 Pain in unspecified joint: Secondary | ICD-10-CM | POA: Diagnosis not present

## 2017-06-24 DIAGNOSIS — M62830 Muscle spasm of back: Secondary | ICD-10-CM | POA: Diagnosis not present

## 2017-06-24 DIAGNOSIS — M5033 Other cervical disc degeneration, cervicothoracic region: Secondary | ICD-10-CM | POA: Diagnosis not present

## 2017-06-24 DIAGNOSIS — M9902 Segmental and somatic dysfunction of thoracic region: Secondary | ICD-10-CM | POA: Diagnosis not present

## 2017-06-24 DIAGNOSIS — M9901 Segmental and somatic dysfunction of cervical region: Secondary | ICD-10-CM | POA: Diagnosis not present

## 2017-06-24 LAB — SEDIMENTATION RATE: Sed Rate: 92 mm/hr — ABNORMAL HIGH (ref 0–20)

## 2017-06-24 MED ORDER — DICLOFENAC POTASSIUM 50 MG PO TABS: 50.0000 mg | ORAL_TABLET | Freq: Two times a day (BID) | ORAL | 0 refills | Status: DC

## 2017-06-24 NOTE — Telephone Encounter (Signed)
Requesting:valium &  ambien Contract:yes UDS:low risk next screen 02/09/17 Last OV:06/23/17 (w/edward)  04/18/17 w/you Next OV:06/28/17 w/edward  08/11/16 w/you Last Refill: Valium 08/13/16 #[id number] Ambien 02/17/17  #[id number]   Please advise

## 2017-06-24 NOTE — Telephone Encounter (Signed)
I did call patient and decided to prescribe diclofenac for his pain and inflammation.  Not to use ibuprofen.  Patient admitted he has not taken his losartan but he will start that today.  He will get with a blood pressure cuff either with my prescription or borrow a blood pressure cuff from 1 of his friends.  I will see him on this coming Tuesday.  Patient is also aware to start the doxycycline.

## 2017-06-24 NOTE — Telephone Encounter (Signed)
He can have refill when he updates UDS. Have signed

## 2017-06-24 NOTE — Telephone Encounter (Signed)
This encounter was created in error - please disregard.

## 2017-06-24 NOTE — Telephone Encounter (Signed)
Written prescription for bp cuff placed up front so pt can pick up.

## 2017-06-24 NOTE — Telephone Encounter (Signed)
Copied from [place] [id number]. Topic: Quick Communication - See Telephone Encounter >> Jun 24, 2017  3:01 PM Bea Graff, NT wrote: CRM for notification. See Telephone encounter for: Pt calling to get lab results. Phone lines were busy. Please call pt with results.   06/24/17.

## 2017-06-24 NOTE — Telephone Encounter (Signed)
Patient gave uds sample earlier today   rx faxed to pharmacy

## 2017-06-24 NOTE — Telephone Encounter (Signed)
Went over lab results with patient as per request. No result was available to chart on.

## 2017-06-24 NOTE — Telephone Encounter (Signed)
Copied from [place]. Topic: Quick Communication - See Telephone Encounter >> Jun 24, 2017  4:24 PM Burnis Medin, NT wrote: CRM for notification. See Telephone encounter for: Pt is calling because [facility] his prescription for LEVEMIR FLEXTOUCH 100 UNIT/ML was wrote for a 30 day supply but is supposed to be for a 90 supply. Pt. Asked that doctor [facility] corrected [facility] [address] Pt also ask the doctor call back.  06/24/17.

## 2017-06-28 ENCOUNTER — Ambulatory Visit: Payer: BLUE CROSS/BLUE SHIELD | Admitting: Medical

## 2017-06-29 ENCOUNTER — Telehealth: Payer: Self-pay | Admitting: Family Medicine

## 2017-06-29 ENCOUNTER — Other Ambulatory Visit: Payer: Self-pay

## 2017-06-29 DIAGNOSIS — M5033 Other cervical disc degeneration, cervicothoracic region: Secondary | ICD-10-CM | POA: Diagnosis not present

## 2017-06-29 DIAGNOSIS — M62830 Muscle spasm of back: Secondary | ICD-10-CM | POA: Diagnosis not present

## 2017-06-29 DIAGNOSIS — M9902 Segmental and somatic dysfunction of thoracic region: Secondary | ICD-10-CM | POA: Diagnosis not present

## 2017-06-29 DIAGNOSIS — M9901 Segmental and somatic dysfunction of cervical region: Secondary | ICD-10-CM | POA: Diagnosis not present

## 2017-06-29 MED ORDER — INSULIN DETEMIR 100 UNIT/ML FLEXPEN: 60.0000 [IU] | PEN_INJECTOR | Freq: Every day | SUBCUTANEOUS | 3 refills | Status: DC

## 2017-06-29 NOTE — Telephone Encounter (Signed)
Medication called in to pharmacy.

## 2017-06-29 NOTE — Telephone Encounter (Signed)
Levemir flextouch was already filled.

## 2017-06-29 NOTE — Telephone Encounter (Signed)
Relation to pt: self Call back number: [phone] Pharmacy: [id number] [place], [place] [phone] (Phone) [phone] (Fax)     Reason for call:  Patient currently at the pharmacy and pharmacy denies receiving diazepam (VALIUM) 5 MG tablet and zolpidem (AMBIEN) 10 MG tablet

## 2017-06-29 NOTE — Telephone Encounter (Signed)
Copied from [place] [id number]. Topic: Quick Communication - See Telephone Encounter >> Jun 29, 2017 10:00 AM Rosalin Hawking wrote: CRM for notification. See Telephone encounter for:  06/29/17.   Pt came in office stating that his rx was sent on 06-24-2017 for LEVEMIR FLEXTOUCH 100 UNIT/ML Pen for 30 days and with refills but his insurance is requiring that his rx needs to be sent in as 90 day supply. Insurance will not pay for the 30 days and refill. Please advise ASAP. Let pt know when rx sent to pharmacy.

## 2017-06-30 DIAGNOSIS — M62830 Muscle spasm of back: Secondary | ICD-10-CM | POA: Diagnosis not present

## 2017-06-30 DIAGNOSIS — M9902 Segmental and somatic dysfunction of thoracic region: Secondary | ICD-10-CM | POA: Diagnosis not present

## 2017-06-30 DIAGNOSIS — M5033 Other cervical disc degeneration, cervicothoracic region: Secondary | ICD-10-CM | POA: Diagnosis not present

## 2017-06-30 DIAGNOSIS — M9901 Segmental and somatic dysfunction of cervical region: Secondary | ICD-10-CM | POA: Diagnosis not present

## 2017-07-01 ENCOUNTER — Other Ambulatory Visit: Payer: Self-pay | Admitting: Family Medicine

## 2017-07-03 LAB — PAIN MGMT, PROFILE 8 W/CONF, U
6 Acetylmorphine: NEGATIVE ng/mL (ref <10)
ALPHAHYDROXYMIDAZOLAM: NEGATIVE ng/mL (ref <50)
AMINOCLONAZEPAM: NEGATIVE ng/mL (ref <25)
AMPHETAMINES: NEGATIVE ng/mL (ref <500)
Alcohol Metabolites: NEGATIVE ng/mL (ref <500)
Alphahydroxyalprazolam: NEGATIVE ng/mL (ref <25)
Alphahydroxytriazolam: NEGATIVE ng/mL (ref <50)
BENZODIAZEPINES: POSITIVE ng/mL — AB (ref <100)
Buprenorphine, Urine: NEGATIVE ng/mL (ref <5)
COCAINE METABOLITE: NEGATIVE ng/mL (ref <150)
CODEINE: NEGATIVE ng/mL (ref <50)
CREATININE: 92 mg/dL
HYDROCODONE: 176 ng/mL — AB (ref <50)
Hydromorphone: 226 ng/mL — ABNORMAL HIGH (ref <50)
Hydroxyethylflurazepam: NEGATIVE ng/mL (ref <50)
Lorazepam: NEGATIVE ng/mL (ref <50)
MDMA: NEGATIVE ng/mL (ref <500)
Marijuana Metabolite: NEGATIVE ng/mL (ref <20)
Morphine: NEGATIVE ng/mL (ref <50)
Nordiazepam: 52 ng/mL — ABNORMAL HIGH (ref <50)
Norhydrocodone: 201 ng/mL — ABNORMAL HIGH (ref <50)
OXAZEPAM: 205 ng/mL — AB (ref <50)
OXIDANT: NEGATIVE ug/mL (ref <200)
OXYCODONE: NEGATIVE ng/mL (ref <100)
Opiates: POSITIVE ng/mL — AB (ref <100)
PH: 6.59 (ref 4.5–9.0)
TEMAZEPAM: 56 ng/mL — AB (ref <50)

## 2017-07-04 DIAGNOSIS — M5033 Other cervical disc degeneration, cervicothoracic region: Secondary | ICD-10-CM | POA: Diagnosis not present

## 2017-07-04 DIAGNOSIS — M9901 Segmental and somatic dysfunction of cervical region: Secondary | ICD-10-CM | POA: Diagnosis not present

## 2017-07-04 DIAGNOSIS — M62830 Muscle spasm of back: Secondary | ICD-10-CM | POA: Diagnosis not present

## 2017-07-04 DIAGNOSIS — M9902 Segmental and somatic dysfunction of thoracic region: Secondary | ICD-10-CM | POA: Diagnosis not present

## 2017-07-06 DIAGNOSIS — M5033 Other cervical disc degeneration, cervicothoracic region: Secondary | ICD-10-CM | POA: Diagnosis not present

## 2017-07-06 DIAGNOSIS — M9902 Segmental and somatic dysfunction of thoracic region: Secondary | ICD-10-CM | POA: Diagnosis not present

## 2017-07-06 DIAGNOSIS — M62830 Muscle spasm of back: Secondary | ICD-10-CM | POA: Diagnosis not present

## 2017-07-06 DIAGNOSIS — M9901 Segmental and somatic dysfunction of cervical region: Secondary | ICD-10-CM | POA: Diagnosis not present

## 2017-07-07 ENCOUNTER — Encounter: Payer: Self-pay | Admitting: Family Medicine

## 2017-07-08 ENCOUNTER — Other Ambulatory Visit: Payer: Self-pay

## 2017-07-08 DIAGNOSIS — M9902 Segmental and somatic dysfunction of thoracic region: Secondary | ICD-10-CM | POA: Diagnosis not present

## 2017-07-08 DIAGNOSIS — M9901 Segmental and somatic dysfunction of cervical region: Secondary | ICD-10-CM | POA: Diagnosis not present

## 2017-07-08 DIAGNOSIS — M62830 Muscle spasm of back: Secondary | ICD-10-CM | POA: Diagnosis not present

## 2017-07-08 DIAGNOSIS — M5033 Other cervical disc degeneration, cervicothoracic region: Secondary | ICD-10-CM | POA: Diagnosis not present

## 2017-07-08 MED ORDER — INSULIN DETEMIR 100 UNIT/ML FLEXPEN: 60.0000 [IU] | PEN_INJECTOR | Freq: Every day | SUBCUTANEOUS | 3 refills | Status: DC

## 2017-07-11 DIAGNOSIS — M9901 Segmental and somatic dysfunction of cervical region: Secondary | ICD-10-CM | POA: Diagnosis not present

## 2017-07-11 DIAGNOSIS — M5033 Other cervical disc degeneration, cervicothoracic region: Secondary | ICD-10-CM | POA: Diagnosis not present

## 2017-07-11 DIAGNOSIS — M9902 Segmental and somatic dysfunction of thoracic region: Secondary | ICD-10-CM | POA: Diagnosis not present

## 2017-07-11 DIAGNOSIS — M62830 Muscle spasm of back: Secondary | ICD-10-CM | POA: Diagnosis not present

## 2017-07-14 DIAGNOSIS — M62830 Muscle spasm of back: Secondary | ICD-10-CM | POA: Diagnosis not present

## 2017-07-14 DIAGNOSIS — M9902 Segmental and somatic dysfunction of thoracic region: Secondary | ICD-10-CM | POA: Diagnosis not present

## 2017-07-14 DIAGNOSIS — M5033 Other cervical disc degeneration, cervicothoracic region: Secondary | ICD-10-CM | POA: Diagnosis not present

## 2017-07-14 DIAGNOSIS — M9901 Segmental and somatic dysfunction of cervical region: Secondary | ICD-10-CM | POA: Diagnosis not present

## 2017-07-15 DIAGNOSIS — M9901 Segmental and somatic dysfunction of cervical region: Secondary | ICD-10-CM | POA: Diagnosis not present

## 2017-07-15 DIAGNOSIS — M62830 Muscle spasm of back: Secondary | ICD-10-CM | POA: Diagnosis not present

## 2017-07-15 DIAGNOSIS — M5033 Other cervical disc degeneration, cervicothoracic region: Secondary | ICD-10-CM | POA: Diagnosis not present

## 2017-07-15 DIAGNOSIS — M9902 Segmental and somatic dysfunction of thoracic region: Secondary | ICD-10-CM | POA: Diagnosis not present

## 2017-07-18 DIAGNOSIS — M5033 Other cervical disc degeneration, cervicothoracic region: Secondary | ICD-10-CM | POA: Diagnosis not present

## 2017-07-18 DIAGNOSIS — M62830 Muscle spasm of back: Secondary | ICD-10-CM | POA: Diagnosis not present

## 2017-07-18 DIAGNOSIS — M9901 Segmental and somatic dysfunction of cervical region: Secondary | ICD-10-CM | POA: Diagnosis not present

## 2017-07-18 DIAGNOSIS — M9902 Segmental and somatic dysfunction of thoracic region: Secondary | ICD-10-CM | POA: Diagnosis not present

## 2017-08-01 ENCOUNTER — Encounter: Payer: Self-pay | Admitting: Podiatry

## 2017-08-01 ENCOUNTER — Ambulatory Visit (INDEPENDENT_AMBULATORY_CARE_PROVIDER_SITE_OTHER): Payer: BLUE CROSS/BLUE SHIELD | Admitting: Podiatry

## 2017-08-01 ENCOUNTER — Ambulatory Visit
Admission: EM | Admit: 2017-08-01 | Discharge: 2017-08-01 | Disposition: A | Discharge status: Home / Self Care | Payer: BLUE CROSS/BLUE SHIELD | Attending: Family Medicine | Admitting: Family Medicine

## 2017-08-01 ENCOUNTER — Other Ambulatory Visit: Payer: Self-pay

## 2017-08-01 ENCOUNTER — Ambulatory Visit: Payer: BLUE CROSS/BLUE SHIELD

## 2017-08-01 ENCOUNTER — Encounter: Payer: Self-pay | Admitting: Emergency Medicine

## 2017-08-01 DIAGNOSIS — M79672 Pain in left foot: Secondary | ICD-10-CM | POA: Diagnosis not present

## 2017-08-01 DIAGNOSIS — M79673 Pain in unspecified foot: Secondary | ICD-10-CM

## 2017-08-01 DIAGNOSIS — M205X9 Other deformities of toe(s) (acquired), unspecified foot: Secondary | ICD-10-CM

## 2017-08-01 DIAGNOSIS — M79671 Pain in right foot: Secondary | ICD-10-CM

## 2017-08-01 DIAGNOSIS — G8929 Other chronic pain: Secondary | ICD-10-CM

## 2017-08-01 DIAGNOSIS — M129 Arthropathy, unspecified: Secondary | ICD-10-CM | POA: Diagnosis not present

## 2017-08-01 DIAGNOSIS — M2550 Pain in unspecified joint: Secondary | ICD-10-CM

## 2017-08-01 DIAGNOSIS — M2020 Hallux rigidus, unspecified foot: Secondary | ICD-10-CM

## 2017-08-01 MED ORDER — KETOROLAC TROMETHAMINE 60 MG/2ML IM SOLN
60.0000 mg | Freq: Once | INTRAMUSCULAR | Status: AC
  Administered 2017-08-01: 60 mg via INTRAMUSCULAR

## 2017-08-01 MED ORDER — INDOMETHACIN 50 MG PO CAPS: 50.0000 mg | ORAL_CAPSULE | Freq: Three times a day (TID) | ORAL | 0 refills | Status: DC | PRN

## 2017-08-01 MED ORDER — HYDROCODONE-ACETAMINOPHEN 5-325 MG PO TABS: ORAL_TABLET | ORAL | 0 refills | Status: DC

## 2017-08-01 NOTE — ED Triage Notes (Signed)
Patient in today c/o 2.5 months of bilateral foot pain (l>R) and right thumb pain. Patient has been treated for gout, but is not getting better.

## 2017-08-01 NOTE — Discharge Instructions (Signed)
RECOMMEND FOLLOW UP WITH PRIMARY CARE PROVIDER FOR POSSIBLE REFERRAL TO PAIN SPECIALIST

## 2017-08-01 NOTE — Progress Notes (Signed)
Subjective:    Patient ID: [name], male    DOB: 1960/09/28, 57 y.o.   MRN: [id number]  HPIthis patient presents the office with chief complaint of severely painful both feet.  Patient states that his pain extends from his mid foot through his toes.  He states that he has sharp, radiating pain through the forefoot that comes and goes. He also says there is burning   in the forefoot of both feet.  He says he has tried anti-inflammatory medicine, but has minimal relief.  He says this pain has been going on for 2-1/2 months and has continued to worsen over time.  This patient is diabetic on metformin and insulin.  Review of his chart review reveals that he was initially seen with a complaint of great toe pain on October 1.  The doctor treated him with Mobic and Norco.   He was then seen by Mackie Pai for  his right thumb pain as well as   foot pain.  He was prescribed Toradol as well as diclofenac.  He also ordered blood work on this patient and the white blood cells came back at 13 and his sedimentation rate was 92,  This patient was then seen in the emergency department this morning by [facility]  and given an injection of Toradol and a prescription for indomethacin.  He then called and made an appointment and was seen in this office by myself in late afternoon.      Review of Systems  Constitutional: Positive for fatigue.  Musculoskeletal: Positive for arthralgias, back pain and gait problem.  All other systems reviewed and are negative.      Objective:   Physical Exam General Appearance  Alert, conversant and in no acute stress.  Vascular  Dorsalis pedis and posterior pulses are palpable  bilaterally.  Capillary return is within normal limits  bilaterally. Temperature is within normal limits  Bilaterally.  Neurologic  Senn-Weinstein monofilament wire test within normal limits  bilaterally. Muscle power within normal limits bilaterally.  Nails Normal nails noted with marked  incurvation of nails both feet.  Orthopedic  No limitations of motion of motion feet bilaterally.  No crepitus or effusions noted.  Palpable pain noted on the dorsal aspect of both feet through the metatarsals.  There is no increased temperature, but swelling is noted through the forefoot bilaterally,  There is palpable pain on the dorsum of the first MPJ bilaterally with limited range of motion especially the big toe joint, left foot.  There is no evidence of an acute arthritic flareup on the forefoot bilaterally.  Skin  normotropic skin with no porokeratosis noted bilaterally.  No signs of infections or ulcers noted.          Assessment & Plan:  Subacute Arthritic flare-up  B/L  FHL.     IE  X-rays taken do reveal a mild metatarsus adductus right foot.  X-rays of the left foot does reveal an old fracture at the base of the proximal phalanx left hallux.  There is also narrowing noted in the 1st MPJ  and arthritic changes noted first MPJ left foot.  There is an elongated first metatarsal bilaterally.  . I believe he may have an arthritic flareup based on his sedimentation rate of 92.  There may be an outside chance that he has an infection due to the increased white blood cell found in his blood work in December.  After my podiatric exam patient does have a functional  hallux limitus with an elongated first metatarsal bilaterally.  I proceeded to order a sedimentation rate right and her arthritic profile to be performed on this date.  He was also dispensed a cam walker on his left foot to help to control his pain and stabilize his foot.  I chose not to prescribe him a Medrol Dosepak to the fact he is diabetic. I also told him that he should be reevaluated in seen in this office in one week.  . I told this patient that I need to obtain blood work and check his response to the [employer] before finalizing on a diagnosis  for this patient. RTC 1 week.   Gardiner Barefoot DPM

## 2017-08-01 NOTE — ED Provider Notes (Addendum)
[facility]    CSN: [id number] Arrival date & time: 08/01/17  4098     History   Chief Complaint Chief Complaint  Patient presents with  . Foot Pain    HPI [name] is a 57 y.o. male.   58 yo male with a c/o 2-3 months of intermittent bilateral foot pains and right thumb pain. Patient is uncontrolled diabetic. States saw his PCP last month for this, x-rays were normal and uric acid normal. States pain is sometimes "sharp", "burning" and lasts a few seconds, but other times more of a consistent severe ache. Denies any injuries, falls, fevers, chills, rash.    The history is provided by the patient.    Past Medical History:  Diagnosis Date  . Anxiety associated with depression 10/11/2013  . Colon cancer ([place])    right colon cancer- adenocarcinoma CEA level isnrmal at 1.8  . Diabetes mellitus   . Erectile dysfunction 06/22/2016  . Great toe pain, right 04/18/2017  . Hepatic artery stenosis ([place])   . Hyperlipidemia   . Hypertension   . Hypertriglyceridemia 12/14/2010  . Iron deficiency anemia 2011  . Low testosterone 06/22/2016  . Nipple pain 09/30/2016  . Obesity   . Preventative health care 10/08/2012  . Psoriasis     Patient Active Problem List   Diagnosis Date Noted  . Great toe pain, right 04/18/2017  . Nipple pain 09/30/2016  . Hypotestosteronism 06/22/2016  . Erectile dysfunction 06/22/2016  . Essential hypertension 06/05/2014  . Palpitations 06/05/2014  . Muscle spasm of back 06/05/2014  . Preventative health care 10/08/2012  . Right knee pain 11/17/2011  . Hypertriglyceridemia 12/14/2010  . Hyperlipidemia, mixed 09/07/2010  . INSOMNIA, CHRONIC 03/20/2010  . Diabetes mellitus type 2 in obese ([place]) 03/20/2010  . Malignant neoplasm of colon ([place]) 01/02/2010  . GERD 11/04/2009  . FATTY LIVER DISEASE 11/04/2009  . Obesity 10/03/2009  . Anemia 10/03/2009  . Psoriasis 10/03/2009    Past Surgical History:  Procedure Laterality Date  .  CARPAL TUNNEL RELEASE     RIGHT  . CHOLECYSTECTOMY  1994  . COLONOSCOPY    . HEMICOLECTOMY     12/17/2009 right  . POLYPECTOMY         Home Medications    Prior to Admission medications   Medication Sig Start Date End Date Taking? Authorizing Provider  escitalopram (LEXAPRO) 10 MG tablet TAKE 1 TABLET (10 MG TOTAL) BY MOUTH DAILY. 07/01/17  Yes Mosie Lukes, MD  fenofibrate 160 MG tablet Take 1 tablet (160 mg total) by mouth daily. 06/28/16  Yes Mosie Lukes, MD  Insulin Detemir (LEVEMIR FLEXTOUCH) 100 UNIT/ML Pen Inject 60 Units into the skin daily at 10 pm. 07/08/17  Yes Mosie Lukes, MD  metFORMIN (GLUCOPHAGE) 1000 MG tablet TAKE 1 TABLET (1,000 MG TOTAL) BY MOUTH 2 (TWO) TIMES DAILY WITH A MEAL. 08/12/16  Yes Mosie Lukes, MD  metoprolol succinate (TOPROL-XL) 25 MG 24 hr tablet TAKE 1 TABLET (25 MG TOTAL) BY MOUTH DAILY. 08/12/16  Yes Mosie Lukes, MD  Adalimumab (HUMIRA PEN) 40 MG/0.8ML PNKT  10/30/14   [provider]  atorvastatin (LIPITOR) 20 MG tablet Take 1 tablet (20 mg total) by mouth daily. 04/19/17   Mosie Lukes, MD  Blood Glucose Monitoring Suppl (ONETOUCH VERIO) W/DEVICE KIT  10/30/14   [provider]  clobetasol cream (TEMOVATE) 0.05 %  07/28/17   [provider]  Clobetasol Propionate (CLOBEX SPRAY) 0.05 % external spray  03/27/13   [provider]  colesevelam (WELCHOL) 625 MG tablet Take by mouth. 11/05/14   [provider]  diazepam (VALIUM) 5 MG tablet TAKE 1 TABLET BY MOUTH EVERY 12 HOURS 09/28/15   [provider]  escitalopram (LEXAPRO) 10 MG tablet TAKE 1 TABLET (10 MG TOTAL) BY MOUTH DAILY. 12/08/15   [provider]  glucose blood (ONETOUCH VERIO) test strip Test three times daily, DX E11.9 12/05/14   Mosie Lukes, MD  insulin aspart (NOVOLOG FLEXPEN) 100 UNIT/ML FlexPen Inject into the skin.    [provider]  Insulin Glargine (LANTUS SOLOSTAR) 100 UNIT/ML Solostar Pen Inject  into the skin.    [provider]  Insulin Pen Needle (B-D ULTRAFINE III SHORT PEN) 31G X 8 MM MISC Test as directed three times daily.  DX E11.9 01/10/15   Mosie Lukes, MD  lisinopril (PRINIVIL,ZESTRIL) 5 MG tablet TAKE 1 TABLET (5 MG TOTAL) BY MOUTH DAILY. 03/16/16   Mosie Lukes, MD  losartan (COZAAR) 50 MG tablet TAKE 1 TABLET (50 MG TOTAL) BY MOUTH DAILY. 11/07/15   [provider]  meloxicam (MOBIC) 15 MG tablet  07/28/17   [provider]  omeprazole (PRILOSEC) 40 MG capsule TAKE ONE CAPSULE BY MOUTH TWICE A DAY 01/25/15   [provider]  Jonetta Speak LANCETS [place]  9/[birth year]/41   [provider]  oxyCODONE-acetaminophen (PERCOCET/ROXICET) 5-325 MG tablet Take by mouth. 12/17/15   [provider]  ustekinumab (STELARA) 90 MG/ML SOSY injection Inject 1 mL (90 mg total) into the skin every 3 (three) months. 01/29/16   Mosie Lukes, MD  zolpidem (AMBIEN) 10 MG tablet TAKE 1 TABLET BY MOUTH AT BEDTIME AS NEEDED 08/11/15   [provider]    Family History Family History  Problem Relation Age of Onset  . Stomach cancer Mother        diedinher 58's  . Diabetes type II Brother        boderline  . Diabetes Brother        type II  . Alcohol abuse Brother   . Other Neg Hx        cad,prostate ca, colon ca  . Coronary artery disease Neg Hx   . Cancer Neg Hx        colon, prostate    Social History Social History   Tobacco Use  . Smoking status: Current Some Day Smoker    Types: Cigarettes  . Smokeless tobacco: Never Used  Substance Use Topics  . Alcohol use: No  . Drug use: No     Allergies   Patient has no known allergies.   Review of Systems Review of Systems   Physical Exam Triage Vital Signs ED Triage Vitals  Enc Vitals Group     BP 08/01/17 0837 (!) 143/73     Pulse Rate 08/01/17 0837 88     Resp 08/01/17 0837 16     Temp 08/01/17 0837 98 F (36.7 C)     Temp Source 08/01/17 0837 Oral     SpO2  08/01/17 0837 99 %     Weight 08/01/17 0837 230 lb (104.3 kg)     Height 08/01/17 0837 _0  (1.727 m)     Head Circumference --      Peak Flow --      Pain Score 08/01/17 0838 7     Pain Loc --      Pain Edu? --  Excl. in GC? --    No data found.  Updated Vital Signs BP (!) 143/73 (BP Location: Left Arm)   Pulse 88   Temp 98 F (36.7 C) (Oral)   Resp 16   Ht _0  (1.727 m)   Wt 230 lb (104.3 kg)   SpO2 99%   BMI 34.97 kg/m   Visual Acuity Right Eye Distance:   Left Eye Distance:   Bilateral Distance:    Right Eye Near:   Left Eye Near:    Bilateral Near:     Physical Exam  Constitutional: He appears well-developed and well-nourished. No distress.  Musculoskeletal:       Right hand: He exhibits tenderness and swelling (mild, over thumb). He exhibits normal range of motion, no bony tenderness, normal two-point discrimination, normal capillary refill, no deformity and no laceration. Normal sensation noted. Normal strength noted.       Right foot: Normal.       Left foot: Normal.  Skin: He is not diaphoretic.  Nursing note and vitals reviewed.    UC Treatments / Results  Labs (all labs ordered are listed, but only abnormal results are displayed) Labs Reviewed - No data to display  EKG  EKG Interpretation None       Radiology No results found.  Procedures Procedures (including critical care time)  Medications Ordered in UC Medications  ketorolac (TORADOL) injection 60 mg (60 mg Intramuscular Given 08/01/17 0935)     Initial Impression / Assessment and Plan / UC Course  I have reviewed the triage vital signs and the nursing notes.  Pertinent labs & imaging results that were available during my care of the patient were reviewed by me and considered in my medical decision making (see chart for details).      Final Clinical Impressions(s) / UC Diagnoses   Final diagnoses:  Arthralgia, unspecified joint  Other chronic pain    ED Discharge  Orders        Ordered    indomethacin (INDOCIN) 50 MG capsule  3 times daily PRN,   Status:  Discontinued    Comments:  Short term treatment prescription   08/01/17 0941    HYDROcodone-acetaminophen (NORCO/VICODIN) 5-325 MG tablet  Status:  Discontinued     08/01/17 0941     1. diagnosis reviewed with patient; patient given toradol 6m IM x 1 2. rx as per orders above; reviewed possible side effects, interactions, risks and benefits  3. Recommend supportive treatment with otc tylenol prn  4. Follow up with PCP for further evaluation and/or possible referral to specialist 5. Follow-up prn if symptoms worsen or don't improve Controlled Substance Prescriptions [place] Controlled Substance Registry consulted? Not Applicable   CNorval Gable MD 08/01/17 2016    CNorval Gable MD 08/01/17 2017

## 2017-08-02 ENCOUNTER — Other Ambulatory Visit: Payer: Self-pay | Admitting: Family Medicine

## 2017-08-02 DIAGNOSIS — E782 Mixed hyperlipidemia: Secondary | ICD-10-CM

## 2017-08-02 DIAGNOSIS — E1169 Type 2 diabetes mellitus with other specified complication: Secondary | ICD-10-CM

## 2017-08-02 DIAGNOSIS — M129 Arthropathy, unspecified: Secondary | ICD-10-CM | POA: Diagnosis not present

## 2017-08-02 DIAGNOSIS — I10 Essential (primary) hypertension: Secondary | ICD-10-CM

## 2017-08-02 DIAGNOSIS — E669 Obesity, unspecified: Secondary | ICD-10-CM

## 2017-08-03 LAB — CBC WITH DIFFERENTIAL/PLATELET
BASOS: 1 %
Basophils Absolute: 0.1 10*3/uL (ref 0.0–0.2)
EOS (ABSOLUTE): 0.2 10*3/uL (ref 0.0–0.4)
EOS: 2 %
HEMATOCRIT: 37.9 % (ref 37.5–51.0)
Hemoglobin: 13 g/dL (ref 13.0–17.7)
IMMATURE GRANS (ABS): 0 10*3/uL (ref 0.0–0.1)
Immature Granulocytes: 0 %
Lymphocytes Absolute: 2.3 10*3/uL (ref 0.7–3.1)
Lymphs: 22 %
MCH: 29.9 pg (ref 26.6–33.0)
MCHC: 34.3 g/dL (ref 31.5–35.7)
MCV: 87 fL (ref 79–97)
MONOS ABS: 0.7 10*3/uL (ref 0.1–0.9)
Monocytes: 7 %
NEUTROS PCT: 68 %
Neutrophils Absolute: 7.2 10*3/uL — ABNORMAL HIGH (ref 1.4–7.0)
Platelets: 404 10*3/uL — ABNORMAL HIGH (ref 150–379)
RBC: 4.35 x10E6/uL (ref 4.14–5.80)
RDW: 13 % (ref 12.3–15.4)
WBC: 10.5 10*3/uL (ref 3.4–10.8)

## 2017-08-03 LAB — ARTHRITIS PANEL
ANA: NEGATIVE
Rhuematoid fact SerPl-aCnc: <10 IU/mL (ref 0.0–13.9)
Sed Rate: 64 mm/hr — ABNORMAL HIGH (ref 0–30)
Uric Acid: 4.3 mg/dL (ref 3.7–8.6)

## 2017-08-03 LAB — C-REACTIVE PROTEIN: CRP: 41.4 mg/L — ABNORMAL HIGH (ref 0.0–4.9)

## 2017-08-04 ENCOUNTER — Telehealth: Payer: Self-pay | Admitting: Emergency Medicine

## 2017-08-04 ENCOUNTER — Telehealth: Payer: Self-pay | Admitting: *Deleted

## 2017-08-04 ENCOUNTER — Encounter: Payer: Self-pay | Admitting: *Deleted

## 2017-08-04 NOTE — Telephone Encounter (Signed)
Emailed through [employer] requested by Dr. Prudence Davidson.

## 2017-08-04 NOTE — Telephone Encounter (Signed)
Called to follow up with patient after his recent visit. Patient states he is now under the care of a podiatrist for his foot/toe pain.

## 2017-08-08 ENCOUNTER — Other Ambulatory Visit: Payer: Self-pay | Admitting: Family Medicine

## 2017-08-08 ENCOUNTER — Other Ambulatory Visit (INDEPENDENT_AMBULATORY_CARE_PROVIDER_SITE_OTHER): Payer: BLUE CROSS/BLUE SHIELD

## 2017-08-08 DIAGNOSIS — E1169 Type 2 diabetes mellitus with other specified complication: Secondary | ICD-10-CM

## 2017-08-08 DIAGNOSIS — E782 Mixed hyperlipidemia: Secondary | ICD-10-CM | POA: Diagnosis not present

## 2017-08-08 DIAGNOSIS — I10 Essential (primary) hypertension: Secondary | ICD-10-CM | POA: Diagnosis not present

## 2017-08-08 DIAGNOSIS — E669 Obesity, unspecified: Secondary | ICD-10-CM

## 2017-08-08 LAB — COMPREHENSIVE METABOLIC PANEL
ALK PHOS: 59 U/L (ref 39–117)
ALT: 16 U/L (ref 0–53)
AST: 14 U/L (ref 0–37)
Albumin: 3.7 g/dL (ref 3.5–5.2)
BILIRUBIN TOTAL: 0.8 mg/dL (ref 0.2–1.2)
BUN: 14 mg/dL (ref 6–23)
CO2: 25 mEq/L (ref 19–32)
Calcium: 8.9 mg/dL (ref 8.4–10.5)
Chloride: 104 mEq/L (ref 96–112)
Creatinine, Ser: 0.65 mg/dL (ref 0.40–1.50)
GFR: 135.67 mL/min (ref >60.00)
GLUCOSE: 117 mg/dL — AB (ref 70–99)
Potassium: 3.9 mEq/L (ref 3.5–5.1)
SODIUM: 138 meq/L (ref 135–145)
TOTAL PROTEIN: 7.2 g/dL (ref 6.0–8.3)

## 2017-08-08 LAB — LIPID PANEL
Cholesterol: 83 mg/dL (ref 0–200)
HDL: 29.5 mg/dL — ABNORMAL LOW (ref >39.00)
LDL Cholesterol: 31 mg/dL (ref 0–99)
NONHDL: 53.12
Total CHOL/HDL Ratio: 3
Triglycerides: 111 mg/dL (ref 0.0–149.0)
VLDL: 22.2 mg/dL (ref 0.0–40.0)

## 2017-08-08 LAB — HEMOGLOBIN A1C: Hgb A1c MFr Bld: 9.6 % — ABNORMAL HIGH (ref 4.6–6.5)

## 2017-08-08 LAB — TSH: TSH: 4.08 u[IU]/mL (ref 0.35–4.50)

## 2017-08-08 MED ORDER — METHYLPREDNISOLONE 4 MG PO TABS: ORAL_TABLET | ORAL | 0 refills | Status: DC

## 2017-08-08 NOTE — Progress Notes (Signed)
Notify him I got a letter form his podiatrist his inflammatory markers are very high (CRP and ESR) please send him in a Medrol dosepak taper to see if this helps and either schedule him a follow up with me in next couple of weeks to assess response and/or refer to rheumatology for arthralgias at his discretion. Warn him steroids can put your sugars up so eat more proteins and veg and less carbs while taking meds

## 2017-08-11 ENCOUNTER — Ambulatory Visit (INDEPENDENT_AMBULATORY_CARE_PROVIDER_SITE_OTHER): Payer: BLUE CROSS/BLUE SHIELD | Admitting: Podiatry

## 2017-08-11 ENCOUNTER — Ambulatory Visit (INDEPENDENT_AMBULATORY_CARE_PROVIDER_SITE_OTHER): Payer: BLUE CROSS/BLUE SHIELD | Admitting: Family Medicine

## 2017-08-11 ENCOUNTER — Encounter: Payer: Self-pay | Admitting: Family Medicine

## 2017-08-11 ENCOUNTER — Ambulatory Visit: Payer: BLUE CROSS/BLUE SHIELD | Admitting: Family Medicine

## 2017-08-11 ENCOUNTER — Encounter: Payer: Self-pay | Admitting: Podiatry

## 2017-08-11 ENCOUNTER — Ambulatory Visit: Payer: BLUE CROSS/BLUE SHIELD | Admitting: Podiatry

## 2017-08-11 VITALS — BP 130/68 | HR 102 | Temp 97.2°F | Resp 18 | Wt 227.6 lb

## 2017-08-11 DIAGNOSIS — L409 Psoriasis, unspecified: Secondary | ICD-10-CM | POA: Diagnosis not present

## 2017-08-11 DIAGNOSIS — B351 Tinea unguium: Secondary | ICD-10-CM

## 2017-08-11 DIAGNOSIS — I10 Essential (primary) hypertension: Secondary | ICD-10-CM

## 2017-08-11 DIAGNOSIS — L400 Psoriasis vulgaris: Secondary | ICD-10-CM | POA: Diagnosis not present

## 2017-08-11 DIAGNOSIS — E669 Obesity, unspecified: Secondary | ICD-10-CM | POA: Diagnosis not present

## 2017-08-11 DIAGNOSIS — M129 Arthropathy, unspecified: Secondary | ICD-10-CM | POA: Diagnosis not present

## 2017-08-11 DIAGNOSIS — M79673 Pain in unspecified foot: Secondary | ICD-10-CM | POA: Diagnosis not present

## 2017-08-11 DIAGNOSIS — M069 Rheumatoid arthritis, unspecified: Secondary | ICD-10-CM

## 2017-08-11 DIAGNOSIS — M79674 Pain in right toe(s): Secondary | ICD-10-CM

## 2017-08-11 DIAGNOSIS — E1169 Type 2 diabetes mellitus with other specified complication: Secondary | ICD-10-CM

## 2017-08-11 DIAGNOSIS — Z79899 Other long term (current) drug therapy: Secondary | ICD-10-CM | POA: Diagnosis not present

## 2017-08-11 DIAGNOSIS — E782 Mixed hyperlipidemia: Secondary | ICD-10-CM

## 2017-08-11 DIAGNOSIS — M79675 Pain in left toe(s): Secondary | ICD-10-CM

## 2017-08-11 DIAGNOSIS — L4052 Psoriatic arthritis mutilans: Secondary | ICD-10-CM | POA: Diagnosis not present

## 2017-08-11 MED ORDER — DIAZEPAM 5 MG PO TABS: 5.0000 mg | ORAL_TABLET | Freq: Two times a day (BID) | ORAL | 3 refills | Status: DC

## 2017-08-11 MED ORDER — ZOLPIDEM TARTRATE 10 MG PO TABS: 10.0000 mg | ORAL_TABLET | Freq: Every evening | ORAL | 3 refills | Status: DC | PRN

## 2017-08-11 MED ORDER — OMEPRAZOLE 40 MG PO CPDR: 40.0000 mg | DELAYED_RELEASE_CAPSULE | Freq: Two times a day (BID) | ORAL | 1 refills | Status: DC

## 2017-08-11 MED ORDER — METHYLPREDNISOLONE 4 MG PO TABS: ORAL_TABLET | ORAL | 0 refills | Status: DC

## 2017-08-11 NOTE — Assessment & Plan Note (Signed)
With worsening joint pain and swelling, joints involved include right thumb, right knee and right foot. Very high sed rate and crp will refer to rheumatology

## 2017-08-11 NOTE — Patient Instructions (Addendum)
Ibuprofen 400 mg every 4-6 hours (max of 2400 mg in 24 hours) Tylenol/Acetaminophen ES 500 mg tabs 1-2 tabs up to 3 x daily (max of 3000 mg in 24 hours) Psoriasis Psoriasis is a long-term (chronic) skin condition. It causes raised, red patches (plaques) on your skin that look silvery. The red patches may show up anywhere on your body. They can be any size or shape. Psoriasis can come and go. It can range from mild to very bad. It cannot be passed from one person to another (not contagious). There is no cure for this condition, but it can be helped with treatment. Follow these instructions at home: Skin Care  Apply moisturizers to your skin as needed. Only use those that your doctor has said are okay.  Apply cool compresses to the affected areas.  Do not scratch your skin. Lifestyle   Do not use tobacco products. This includes cigarettes, chewing tobacco, and e-cigarettes. If you need help quitting, ask your doctor.  Drink little or no alcohol.  Try to lower your stress. Meditation or yoga may help.  Get sun as told by your doctor. Do not get sunburned.  Think about joining a psoriasis support group. Medicines  Take or use over-the-counter and prescription medicines only as told by your doctor.  If you were prescribed an antibiotic, take or use it as told by your doctor. Do not stop taking the antibiotic even if your condition starts to get better. General instructions  Keep a journal. Use this to help track what triggers an outbreak. Try to avoid any triggers.  See a counselor or social worker if you feel very sad, upset, or hopeless about your condition and these feelings affect your work or relationships.  Keep all follow-up visits as told by your doctor. This is important. Contact a doctor if:  Your pain gets worse.  You have more redness or warmth in the affected areas.  You have new pain or stiffness in your joints.  Your pain or stiffness in your joints gets  worse.  Your nails start to break easily.  Your nails pull away from the nail bed easily.  You have a fever.  You feel very sad (depressed). This information is not intended to replace advice given to you by your health care provider. Make sure you discuss any questions you have with your health care provider. Document Released: 08/12/2004 Document Revised: 12/11/2015 Document Reviewed: 11/20/2014 Elsevier Interactive Patient Education  2018 [employer].

## 2017-08-11 NOTE — Progress Notes (Signed)
This patient presents to the office follow-up for diagnosis of an acute arthritic flareup both feet.  Patient was seen in the office on 08/01/2017.  He says that the pain in  his feet had been aproximately 6-8 at that visit.  The  bloodwork was taken revealing significant elevation in his C-reactive protein as well as his sedimentation rate.  The bloodwork did reveal a decrease in his right blood cell count. He was also disp cam walker which she says has helped reduce his pain as he walks.   He says he was seen by his medical doctor, Dr. Randel Pigg who prescribed him a Medrol Dosepak and he says the swelling in the pain decreased.  He says the pain is approximately 2-3 as he walks in his Cam Walker.  His medical doctor also prescribed additional blood work for which he will be seen in her office today for an evaluation and treatment.  He also says that he is concerned about his long disfigured discolored nails.  He presents the office today for continued evaluation and treatment of his feet.  General Appearance  Alert, conversant and in no acute stress.  Vascular  Dorsalis pedis and posterior pulses are palpable  bilaterally.  Capillary return is within normal limits  bilaterally. Temperature is within normal limits  Bilaterally.  Neurologic  Senn-Weinstein monofilament wire test within normal limits  bilaterally. Muscle power within normal limits bilaterally.  Nails Thick disfigured discolored nails with subungual debris bilaterally from hallux to fifth toes bilaterally. No evidence of bacterial infection or drainage bilaterally.  Orthopedic  No limitations of motion of motion feet bilaterally.  No crepitus or effusions noted.  No bony pathology or digital deformities noted. Examination of both feet reveal reduced, swelling and decreased temperature in both feet.  Palpable pain is not present through the metatarsals both feet.    Skin  normotropic skin with no porokeratosis noted bilaterally.  No signs of  infections or ulcers noted.  Dry peeling skin noted on the skin both feet.   S/P Acute arthritic Flare-up   Onychomycosis  B/L  ROV.  His feet. Both have improved dramatically despite him saying pain persists. He is under the care of his PCP, Dr. Randel Pigg presently.  He is scheduled for a visit with her this afternoon.  His feet have markedly improved and I suggested he be evaluated by a rhumatologist.   I do not feel his acute flareup was the result of his diabetes.  This patient does have a history of psoriasis.  I told him to continue to wear his Cam [place] Rile and suggested he remain out of work for an additional week to allow healing to be complete. I suggested that this be determined after his visit to Dr.  Randel Pigg today.  Debridement of nails  X 10. RTC 3 months for nail care.   Gardiner Barefoot DPM

## 2017-08-11 NOTE — Progress Notes (Signed)
Subjective:  I acted as a Education administrator for Dr. Charlett Blake. [place], [place]  Patient ID: [name], male    DOB: 1960/12/26, 57 y.o.   MRN: [id number]  No chief complaint on file.   HPI  Patient is in today for follow up and is having great trouble with joint pain. Had presented to podiatry with foot pain and upon labs being drawn it was discovered that his sed rate and crp were significantly elevated. Since then his foot continues to hurt and now his right thumb is very painful and swollen. No injury involved. He has a history of psoriasis but it has worsened recently. No polyuria or polydipsia. Denies CP/palp/SOB/HA/congestion/fevers/GI or GU c/o. Taking meds as prescribed  Patient Care Team: Mosie Lukes, MD as PCP - General (Family Medicine)   Past Medical History:  Diagnosis Date  . Anxiety associated with depression 10/11/2013  . Colon cancer ([place])    right colon cancer- adenocarcinoma CEA level isnrmal at 1.8  . Diabetes mellitus   . Erectile dysfunction 06/22/2016  . Great toe pain, right 04/18/2017  . Hepatic artery stenosis ([place])   . Hyperlipidemia   . Hypertension   . Hypertriglyceridemia 12/14/2010  . Iron deficiency anemia 2011  . Low testosterone 06/22/2016  . Nipple pain 09/30/2016  . Obesity   . Preventative health care 10/08/2012  . Psoriasis     Past Surgical History:  Procedure Laterality Date  . CARPAL TUNNEL RELEASE     RIGHT  . CHOLECYSTECTOMY  1994  . COLONOSCOPY    . HEMICOLECTOMY     12/17/2009 right  . POLYPECTOMY      Family History  Problem Relation Age of Onset  . Stomach cancer Mother        diedinher 52's  . Diabetes type II Brother        boderline  . Diabetes Brother        type II  . Alcohol abuse Brother   . Other Neg Hx        cad,prostate ca, colon ca  . Coronary artery disease Neg Hx   . Cancer Neg Hx        colon, prostate    Social History   Socioeconomic History  . Marital status: Single    Spouse name: [name]  . Number of  children: 0  . Years of education: Not on file  . Highest education level: Not on file  Social Needs  . Financial resource strain: Not on file  . Food insecurity - worry: Not on file  . Food insecurity - inability: Not on file  . Transportation needs - medical: Not on file  . Transportation needs - non-medical: Not on file  Occupational History  . Occupation: Merchant navy officer    Comment: Recruitment consultant  Tobacco Use  . Smoking status: Current Some Day Smoker    Types: Cigarettes  . Smokeless tobacco: Never Used  Substance and Sexual Activity  . Alcohol use: No  . Drug use: No  . Sexual activity: Yes    Comment: lives with girlfriend, no dietary restrictions  Other Topics Concern  . Not on file  Social History Narrative   Occupation: Theatre stage manager and fax)   Single  (lives with [name])   no children    former smoker   Illicit Drug Use - no     Outpatient Medications Prior to Visit  Medication Sig Dispense Refill  . Adalimumab (HUMIRA PEN) 40 MG/0.8ML PNKT     .  atorvastatin (LIPITOR) 20 MG tablet Take 1 tablet (20 mg total) by mouth daily. 90 tablet 3  . Blood Glucose Monitoring Suppl (ONETOUCH VERIO) W/DEVICE KIT   0  . clobetasol cream (TEMOVATE) 0.05 %     . Clobetasol Propionate (CLOBEX SPRAY) 0.05 % external spray     . colesevelam (WELCHOL) 625 MG tablet Take by mouth.    . escitalopram (LEXAPRO) 10 MG tablet TAKE 1 TABLET (10 MG TOTAL) BY MOUTH DAILY. 90 tablet 0  . escitalopram (LEXAPRO) 10 MG tablet TAKE 1 TABLET (10 MG TOTAL) BY MOUTH DAILY.    . fenofibrate 160 MG tablet Take 1 tablet (160 mg total) by mouth daily. 90 tablet 3  . glucose blood (ONETOUCH VERIO) test strip Test three times daily, DX E11.9 300 each 6  . insulin aspart (NOVOLOG FLEXPEN) 100 UNIT/ML FlexPen Inject into the skin.    . Insulin Detemir (LEVEMIR FLEXTOUCH) 100 UNIT/ML Pen Inject 60 Units into the skin daily at 10 pm. 45 mL 3  . Insulin Glargine (LANTUS SOLOSTAR) 100 UNIT/ML Solostar Pen  Inject into the skin.    . Insulin Pen Needle (B-D ULTRAFINE III SHORT PEN) 31G X 8 MM MISC Test as directed three times daily.  DX E11.9 300 each 3  . lisinopril (PRINIVIL,ZESTRIL) 5 MG tablet TAKE 1 TABLET (5 MG TOTAL) BY MOUTH DAILY. 90 tablet 2  . losartan (COZAAR) 50 MG tablet TAKE 1 TABLET (50 MG TOTAL) BY MOUTH DAILY.    . meloxicam (MOBIC) 15 MG tablet     . metFORMIN (GLUCOPHAGE) 1000 MG tablet TAKE 1 TABLET (1,000 MG TOTAL) BY MOUTH 2 (TWO) TIMES DAILY WITH A MEAL. 180 tablet 2  . metoprolol succinate (TOPROL-XL) 25 MG 24 hr tablet TAKE 1 TABLET (25 MG TOTAL) BY MOUTH DAILY. 90 tablet 1  . [place] LANCETS MISC     . oxyCODONE-acetaminophen (PERCOCET/ROXICET) 5-325 MG tablet Take by mouth.    . ustekinumab (STELARA) 90 MG/ML SOSY injection Inject 1 mL (90 mg total) into the skin every 3 (three) months.    . diazepam (VALIUM) 5 MG tablet TAKE 1 TABLET BY MOUTH EVERY 12 HOURS    . methylPREDNISolone (MEDROL) 4 MG tablet 5 tab po qd X 1d then 4 tab po qd X 1d then 3 tab po qd X 1d then 2 tab po qd then 1 tab po qd 15 tablet 0  . omeprazole (PRILOSEC) 40 MG capsule TAKE ONE CAPSULE BY MOUTH TWICE A DAY    . zolpidem (AMBIEN) 10 MG tablet TAKE 1 TABLET BY MOUTH AT BEDTIME AS NEEDED     No facility-administered medications prior to visit.     No Known Allergies  Review of Systems  Constitutional: Positive for malaise/fatigue. Negative for fever.  HENT: Negative for congestion.   Eyes: Negative for blurred vision.  Respiratory: Negative for shortness of breath.   Cardiovascular: Negative for chest pain, palpitations and leg swelling.  Gastrointestinal: Negative for abdominal pain, blood in stool and nausea.  Genitourinary: Negative for dysuria and frequency.  Musculoskeletal: Positive for back pain and joint pain. Negative for falls.  Skin: Negative for rash.  Neurological: Negative for dizziness, loss of consciousness and headaches.  Endo/Heme/Allergies: Negative for  environmental allergies.  Psychiatric/Behavioral: Negative for depression. The patient is not nervous/anxious.        Objective:    Physical Exam  Constitutional: He is oriented to person, place, and time. He appears well-developed and well-nourished. No distress.  HENT:  Head: Normocephalic and atraumatic.  Nose: Nose normal.  Eyes: Right eye exhibits no discharge. Left eye exhibits no discharge.  Neck: Normal range of motion. Neck supple.  Cardiovascular: Normal rate and regular rhythm.  No murmur heard. Pulmonary/Chest: Effort normal and breath sounds normal.  Abdominal: Soft. Bowel sounds are normal. There is tenderness. There is guarding.  Musculoskeletal: He exhibits no edema.  Right thumb is swollen and red  Neurological: He is alert and oriented to person, place, and time.  Skin: Skin is warm and dry. Rash noted.  Psoriatic plaques noted scattered.  Psychiatric: He has a normal mood and affect.  Nursing note and vitals reviewed.   BP 130/68 (BP Location: Left Arm, Patient Position: Sitting, Cuff Size: Normal)   Pulse (!) 102   Temp (!) 97.2 F (36.2 C) (Oral)   Resp 18   Wt 227 lb 9.6 oz (103.2 kg)   SpO2 95%   BMI 34.61 kg/m  Wt Readings from Last 3 Encounters:  08/11/17 227 lb 9.6 oz (103.2 kg)  08/01/17 230 lb (104.3 kg)  06/23/17 227 lb (103 kg)   BP Readings from Last 3 Encounters:  08/11/17 130/68  08/01/17 (!) 143/73  06/23/17 (!) 150/80     Immunization History  Administered Date(s) Administered  . Influenza Whole 03/20/2010  . Influenza,inj,Quad PF,6+ Mos 05/01/2015, 06/22/2016, 04/18/2017  . Pneumococcal Conjugate-13 04/18/2017  . Tdap 05/01/2015    Health Maintenance  Topic Date Due  . PNEUMOCOCCAL POLYSACCHARIDE VACCINE (1) 11/03/1964  . FOOT EXAM  11/03/1972  . OPHTHALMOLOGY EXAM  11/03/1972  . HIV Screening  11/03/1977  . COLONOSCOPY  06/22/2016  . HEMOGLOBIN A1C  02/05/2018  . TETANUS/TDAP  04/30/2025  . INFLUENZA VACCINE   Completed  . Hepatitis C Screening  Completed    Lab Results  Component Value Date   WBC 10.5 08/02/2017   HGB 13.0 08/02/2017   HCT 37.9 08/02/2017   PLT 404 (H) 08/02/2017   GLUCOSE 117 (H) 08/08/2017   CHOL 83 08/08/2017   TRIG 111.0 08/08/2017   HDL 29.50 (L) 08/08/2017   LDLDIRECT 54.0 04/18/2017   LDLCALC 31 08/08/2017   ALT 16 08/08/2017   AST 14 08/08/2017   NA 138 08/08/2017   K 3.9 08/08/2017   CL 104 08/08/2017   CREATININE 0.65 08/08/2017   BUN 14 08/08/2017   CO2 25 08/08/2017   TSH 4.08 08/08/2017   PSA 0.27 04/18/2017   INR 0.9 10/27/2014   HGBA1C 9.6 (H) 08/08/2017   MICROALBUR 0.50 01/08/2013    Lab Results  Component Value Date   TSH 4.08 08/08/2017   Lab Results  Component Value Date   WBC 10.5 08/02/2017   HGB 13.0 08/02/2017   HCT 37.9 08/02/2017   MCV 87 08/02/2017   PLT 404 (H) 08/02/2017   Lab Results  Component Value Date   NA 138 08/08/2017   K 3.9 08/08/2017   CO2 25 08/08/2017   GLUCOSE 117 (H) 08/08/2017   BUN 14 08/08/2017   CREATININE 0.65 08/08/2017   BILITOT 0.8 08/08/2017   ALKPHOS 59 08/08/2017   AST 14 08/08/2017   ALT 16 08/08/2017   PROT 7.2 08/08/2017   ALBUMIN 3.7 08/08/2017   CALCIUM 8.9 08/08/2017   ANIONGAP 4 (L) 10/30/2014   GFR 135.67 08/08/2017   Lab Results  Component Value Date   CHOL 83 08/08/2017   Lab Results  Component Value Date   HDL 29.50 (L) 08/08/2017   Lab Results  Component Value  Date   LDLCALC 31 08/08/2017   Lab Results  Component Value Date   TRIG 111.0 08/08/2017   Lab Results  Component Value Date   CHOLHDL 3 08/08/2017   Lab Results  Component Value Date   HGBA1C 9.6 (H) 08/08/2017         Assessment & Plan:   Problem List Items Addressed This Visit    Psoriasis    With worsening joint pain and swelling, joints involved include right thumb, right knee and right foot. Very high sed rate and crp will refer to rheumatology      Diabetes mellitus type 2 in obese  (HCC)    minimize simple carbs. Increase exercise as tolerated. Continue current meds      Hyperlipidemia, mixed    Tolerating statin, encouraged heart healthy diet, avoid trans fats, minimize simple carbs and saturated fats. Increase exercise as tolerated      Essential hypertension    Well controlled, no changes to meds. Encouraged heart healthy diet such as the DASH diet and exercise as tolerated.        Other Visit Diagnoses    Rheumatoid arthritis, involving unspecified site, unspecified rheumatoid factor presence ([place])    -  Primary   Relevant Medications   methylPREDNISolone (MEDROL) 4 MG tablet   Other Relevant Orders   Ambulatory referral to Rheumatology      I have changed Jorrell Suchy's zolpidem, diazepam, omeprazole, and methylPREDNISolone. I am also having him maintain his ONETOUCH [place] LANCETS, ONETOUCH VERIO, glucose blood, Insulin Pen Needle, ustekinumab, lisinopril, fenofibrate, metFORMIN, metoprolol succinate, atorvastatin, escitalopram, Insulin Detemir, Adalimumab, colesevelam, Insulin Glargine, meloxicam, oxyCODONE-acetaminophen, clobetasol cream, losartan, insulin aspart, escitalopram, and Clobetasol Propionate.  Meds ordered this encounter  Medications  . zolpidem (AMBIEN) 10 MG tablet    Sig: Take 1 tablet (10 mg total) by mouth at bedtime as needed.    Dispense:  30 tablet    Refill:  3  . diazepam (VALIUM) 5 MG tablet    Sig: Take 1 tablet (5 mg total) by mouth every 12 (twelve) hours.    Dispense:  30 tablet    Refill:  3  . omeprazole (PRILOSEC) 40 MG capsule    Sig: Take 1 capsule (40 mg total) by mouth 2 (two) times daily.    Dispense:  180 capsule    Refill:  1  . methylPREDNISolone (MEDROL) 4 MG tablet    Sig: 5 tab po qd X 3d then 4 tab po qd X 3d then 3 tab po qd X 3d then 2 tab po qd x 3 d then 1 tab po qd x 3 d then 1/2 po qd x 3 days.    Dispense:  47 tablet    Refill:  0    CMA served as scribe during this visit. History, Physical  and Plan performed by medical provider. Documentation and orders reviewed and attested to.  Penni Homans, MD

## 2017-08-12 ENCOUNTER — Ambulatory Visit: Payer: BLUE CROSS/BLUE SHIELD | Admitting: Family Medicine

## 2017-08-14 NOTE — Assessment & Plan Note (Signed)
minimize simple carbs. Increase exercise as tolerated. Continue current meds  

## 2017-08-14 NOTE — Assessment & Plan Note (Signed)
Tolerating statin, encouraged heart healthy diet, avoid trans fats, minimize simple carbs and saturated fats. Increase exercise as tolerated 

## 2017-08-14 NOTE — Assessment & Plan Note (Signed)
Well controlled, no changes to meds. Encouraged heart healthy diet such as the DASH diet and exercise as tolerated.  °

## 2017-08-16 ENCOUNTER — Telehealth: Payer: Self-pay | Admitting: *Deleted

## 2017-08-16 ENCOUNTER — Other Ambulatory Visit: Payer: Self-pay

## 2017-08-16 DIAGNOSIS — E782 Mixed hyperlipidemia: Secondary | ICD-10-CM

## 2017-08-16 MED ORDER — ATORVASTATIN CALCIUM 20 MG PO TABS: 20.0000 mg | ORAL_TABLET | Freq: Every day | ORAL | 3 refills | Status: DC

## 2017-08-16 NOTE — Telephone Encounter (Signed)
Received request for Medical Records from [facility] medical release signed] for Lab Reports; sent from October 2018 until present/SLS

## 2017-08-16 NOTE — Telephone Encounter (Signed)
Received FMLA/STD paperwork from [employer];  will complete as much as possible and then forwarded to provider/SLS 01/29

## 2017-08-17 NOTE — Telephone Encounter (Signed)
Paperwork forwarded to provider with attached OV notes/SLS 01/30

## 2017-08-19 NOTE — Telephone Encounter (Signed)
Provider's Medical Assistant has paperwork and will call patient to inform him it is ready for pick-up/SLS 02/01

## 2017-08-25 ENCOUNTER — Ambulatory Visit: Payer: BLUE CROSS/BLUE SHIELD | Admitting: Podiatry

## 2017-08-25 DIAGNOSIS — L409 Psoriasis, unspecified: Secondary | ICD-10-CM | POA: Diagnosis not present

## 2017-08-25 DIAGNOSIS — Z79899 Other long term (current) drug therapy: Secondary | ICD-10-CM | POA: Diagnosis not present

## 2017-08-25 DIAGNOSIS — L4050 Arthropathic psoriasis, unspecified: Secondary | ICD-10-CM | POA: Insufficient documentation

## 2017-08-25 DIAGNOSIS — Z85038 Personal history of other malignant neoplasm of large intestine: Secondary | ICD-10-CM | POA: Diagnosis not present

## 2017-08-25 HISTORY — DX: Arthropathic psoriasis, unspecified: L40.50

## 2017-08-28 ENCOUNTER — Other Ambulatory Visit: Payer: Self-pay | Admitting: Family Medicine

## 2017-08-30 ENCOUNTER — Encounter: Payer: Self-pay | Admitting: Family Medicine

## 2017-08-30 ENCOUNTER — Other Ambulatory Visit: Payer: Self-pay | Admitting: Family Medicine

## 2017-09-05 ENCOUNTER — Other Ambulatory Visit: Payer: Self-pay

## 2017-09-05 MED ORDER — METHYLPREDNISOLONE 4 MG PO TABS: ORAL_TABLET | ORAL | 0 refills | Status: DC

## 2017-09-27 ENCOUNTER — Ambulatory Visit: Payer: BLUE CROSS/BLUE SHIELD | Admitting: Family Medicine

## 2017-11-09 ENCOUNTER — Other Ambulatory Visit: Payer: Self-pay

## 2017-11-09 MED ORDER — OMEPRAZOLE 40 MG PO CPDR: 40.0000 mg | DELAYED_RELEASE_CAPSULE | Freq: Two times a day (BID) | ORAL | 1 refills | Status: DC

## 2017-11-23 DIAGNOSIS — M19071 Primary osteoarthritis, right ankle and foot: Secondary | ICD-10-CM | POA: Diagnosis not present

## 2017-11-23 DIAGNOSIS — M79672 Pain in left foot: Secondary | ICD-10-CM | POA: Diagnosis not present

## 2017-11-23 DIAGNOSIS — M19072 Primary osteoarthritis, left ankle and foot: Secondary | ICD-10-CM | POA: Diagnosis not present

## 2017-11-23 DIAGNOSIS — Z79899 Other long term (current) drug therapy: Secondary | ICD-10-CM | POA: Diagnosis not present

## 2017-11-23 DIAGNOSIS — L4050 Arthropathic psoriasis, unspecified: Secondary | ICD-10-CM | POA: Diagnosis not present

## 2017-11-23 DIAGNOSIS — Z85038 Personal history of other malignant neoplasm of large intestine: Secondary | ICD-10-CM | POA: Diagnosis not present

## 2017-11-23 DIAGNOSIS — M79671 Pain in right foot: Secondary | ICD-10-CM | POA: Diagnosis not present

## 2017-11-23 DIAGNOSIS — L409 Psoriasis, unspecified: Secondary | ICD-10-CM | POA: Diagnosis not present

## 2017-11-24 ENCOUNTER — Ambulatory Visit (INDEPENDENT_AMBULATORY_CARE_PROVIDER_SITE_OTHER): Payer: BLUE CROSS/BLUE SHIELD | Admitting: Family Medicine

## 2017-11-24 ENCOUNTER — Encounter: Payer: Self-pay | Admitting: Family Medicine

## 2017-11-24 VITALS — BP 121/63 | HR 100 | Temp 98.0°F | Resp 16 | Ht 68.11 in | Wt 234.0 lb

## 2017-11-24 DIAGNOSIS — R002 Palpitations: Secondary | ICD-10-CM

## 2017-11-24 DIAGNOSIS — E782 Mixed hyperlipidemia: Secondary | ICD-10-CM

## 2017-11-24 DIAGNOSIS — E669 Obesity, unspecified: Secondary | ICD-10-CM | POA: Diagnosis not present

## 2017-11-24 DIAGNOSIS — I10 Essential (primary) hypertension: Secondary | ICD-10-CM

## 2017-11-24 DIAGNOSIS — Z85038 Personal history of other malignant neoplasm of large intestine: Secondary | ICD-10-CM | POA: Diagnosis not present

## 2017-11-24 DIAGNOSIS — E1169 Type 2 diabetes mellitus with other specified complication: Secondary | ICD-10-CM | POA: Diagnosis not present

## 2017-11-24 MED ORDER — ZOLPIDEM TARTRATE 10 MG PO TABS: 10.0000 mg | ORAL_TABLET | Freq: Every evening | ORAL | 3 refills | Status: DC | PRN

## 2017-11-24 MED ORDER — LOSARTAN POTASSIUM 50 MG PO TABS: ORAL_TABLET | ORAL | 1 refills | Status: DC

## 2017-11-24 MED ORDER — ESCITALOPRAM OXALATE 10 MG PO TABS: 10.0000 mg | ORAL_TABLET | Freq: Every day | ORAL | 1 refills | Status: DC

## 2017-11-24 MED ORDER — BUSPIRONE HCL 5 MG PO TABS: 5.0000 mg | ORAL_TABLET | Freq: Two times a day (BID) | ORAL | 3 refills | Status: DC

## 2017-11-24 MED ORDER — DIAZEPAM 5 MG PO TABS: 5.0000 mg | ORAL_TABLET | Freq: Every evening | ORAL | 5 refills | Status: DC | PRN

## 2017-11-24 NOTE — Patient Instructions (Signed)

## 2017-11-24 NOTE — Progress Notes (Signed)
Subjective:  I acted as a Education administrator for [employer]. Yancey Flemings, [place]   Patient ID: [name], male    DOB: 1960-11-19, 57 y.o.   MRN: [id number]  Chief Complaint  Patient presents with  . Follow-up    HPI  Patient is in today for follow visit.and he continues to struggle with fatigue, malaise and anhedonia. He denies any recent febrile illness or hospitalizations. He is noting ongoing anxiety, agitation and depression. Is denying polyuria or polydipsia. Is under a great deal of stress at work presently. Denies CP/palp/SOB/HA/congestion/fevers/GI or GU c/o. Taking meds as prescribed  Patient Care Team: Mosie Lukes, MD as PCP - General (Family Medicine)   Past Medical History:  Diagnosis Date  . Anxiety associated with depression 10/11/2013  . Colon cancer ([place])    right colon cancer- adenocarcinoma CEA level isnrmal at 1.8  . Diabetes mellitus   . Erectile dysfunction 06/22/2016  . Great toe pain, right 04/18/2017  . Hepatic artery stenosis ([place])   . Hyperlipidemia   . Hypertension   . Hypertriglyceridemia 12/14/2010  . Iron deficiency anemia 2011  . Low testosterone 06/22/2016  . Nipple pain 09/30/2016  . Obesity   . Preventative health care 10/08/2012  . Psoriasis     Past Surgical History:  Procedure Laterality Date  . CARPAL TUNNEL RELEASE     RIGHT  . CHOLECYSTECTOMY  1994  . COLONOSCOPY    . HEMICOLECTOMY     12/17/2009 right  . POLYPECTOMY      Family History  Problem Relation Age of Onset  . Stomach cancer Mother        diedinher 42's  . Diabetes type II Brother        boderline  . Diabetes Brother        type II  . Alcohol abuse Brother   . Other Neg Hx        cad,prostate ca, colon ca  . Coronary artery disease Neg Hx   . Cancer Neg Hx        colon, prostate    Social History   Socioeconomic History  . Marital status: Single    Spouse name: [name]  . Number of children: 0  . Years of education: Not on file  . Highest education level: Not on file    Occupational History  . Occupation: Merchant navy officer    Comment: Recruitment consultant  Social Needs  . Financial resource strain: Not on file  . Food insecurity:    Worry: Not on file    Inability: Not on file  . Transportation needs:    Medical: Not on file    Non-medical: Not on file  Tobacco Use  . Smoking status: Current Some Day Smoker    Types: Cigarettes  . Smokeless tobacco: Never Used  Substance and Sexual Activity  . Alcohol use: No  . Drug use: No  . Sexual activity: Yes    Comment: lives with girlfriend, no dietary restrictions  Lifestyle  . Physical activity:    Days per week: Not on file    Minutes per session: Not on file  . Stress: Not on file  Relationships  . Social connections:    Talks on phone: Not on file    Gets together: Not on file    Attends religious service: Not on file    Active member of club or organization: Not on file    Attends meetings of clubs or organizations: Not on file    Relationship  status: Not on file  . Intimate partner violence:    Fear of current or ex partner: Not on file    Emotionally abused: Not on file    Physically abused: Not on file    Forced sexual activity: Not on file  Other Topics Concern  . Not on file  Social History Narrative   Occupation: Theatre stage manager and fax)   Single  (lives with [name])   no children    former smoker   Illicit Drug Use - no     Outpatient Medications Prior to Visit  Medication Sig Dispense Refill  . Adalimumab (HUMIRA PEN) 40 MG/0.8ML PNKT     . atorvastatin (LIPITOR) 20 MG tablet Take 1 tablet (20 mg total) by mouth daily. 90 tablet 3  . Blood Glucose Monitoring Suppl (ONETOUCH VERIO) W/DEVICE KIT   0  . clobetasol cream (TEMOVATE) 0.05 %     . Clobetasol Propionate (CLOBEX SPRAY) 0.05 % external spray     . colesevelam (WELCHOL) 625 MG tablet Take by mouth.    . escitalopram (LEXAPRO) 10 MG tablet TAKE 1 TABLET (10 MG TOTAL) BY MOUTH DAILY.    . fenofibrate 160 MG tablet Take 1  tablet (160 mg total) by mouth daily. 90 tablet 3  . glucose blood (ONETOUCH VERIO) test strip Test three times daily, DX E11.9 300 each 6  . insulin aspart (NOVOLOG FLEXPEN) 100 UNIT/ML FlexPen Inject into the skin.    . Insulin Detemir (LEVEMIR FLEXTOUCH) 100 UNIT/ML Pen Inject 60 Units into the skin daily at 10 pm. 45 mL 3  . Insulin Glargine (LANTUS SOLOSTAR) 100 UNIT/ML Solostar Pen Inject into the skin.    . Insulin Pen Needle (B-D ULTRAFINE III SHORT PEN) 31G X 8 MM MISC Test as directed three times daily.  DX E11.9 300 each 3  . lisinopril (PRINIVIL,ZESTRIL) 5 MG tablet TAKE 1 TABLET (5 MG TOTAL) BY MOUTH DAILY. 90 tablet 2  . meloxicam (MOBIC) 15 MG tablet     . metFORMIN (GLUCOPHAGE) 1000 MG tablet TAKE 1 TABLET (1,000 MG TOTAL) BY MOUTH 2 (TWO) TIMES DAILY WITH A MEAL. 180 tablet 2  . methylPREDNISolone (MEDROL) 4 MG tablet 5 tab po qd X 5d then 4 tab po qd X 5d then 3 tab po qd X 5d then 2 tab po qd x 5d then 1 tab po qd x 5d then 1/2 po qd x  5days. 78 tablet 0  . metoprolol succinate (TOPROL-XL) 25 MG 24 hr tablet TAKE 1 TABLET (25 MG TOTAL) BY MOUTH DAILY. 90 tablet 1  . omeprazole (PRILOSEC) 40 MG capsule Take 1 capsule (40 mg total) by mouth 2 (two) times daily. 180 capsule 1  . [place] LANCETS MISC     . oxyCODONE-acetaminophen (PERCOCET/ROXICET) 5-325 MG tablet Take by mouth.    . ustekinumab (STELARA) 90 MG/ML SOSY injection Inject 1 mL (90 mg total) into the skin every 3 (three) months.    . diazepam (VALIUM) 5 MG tablet Take 1 tablet (5 mg total) by mouth every 12 (twelve) hours. 30 tablet 3  . escitalopram (LEXAPRO) 10 MG tablet TAKE 1 TABLET (10 MG TOTAL) BY MOUTH DAILY. 90 tablet 0  . losartan (COZAAR) 50 MG tablet TAKE 1 TABLET (50 MG TOTAL) BY MOUTH DAILY.    Marland Kitchen zolpidem (AMBIEN) 10 MG tablet Take 1 tablet (10 mg total) by mouth at bedtime as needed. 30 tablet 3   No facility-administered medications prior to visit.  No Known Allergies  Review of Systems    Constitutional: Positive for malaise/fatigue. Negative for fever.  HENT: Negative for congestion.   Eyes: Negative for blurred vision.  Respiratory: Negative for shortness of breath.   Cardiovascular: Negative for chest pain, palpitations and leg swelling.  Gastrointestinal: Negative for abdominal pain, blood in stool and nausea.  Genitourinary: Negative for dysuria and frequency.  Musculoskeletal: Positive for joint pain. Negative for falls.  Skin: Negative for rash.  Neurological: Negative for dizziness, loss of consciousness and headaches.  Endo/Heme/Allergies: Negative for environmental allergies.  Psychiatric/Behavioral: Positive for depression. The patient is nervous/anxious.        Objective:    Physical Exam  Constitutional: He is oriented to person, place, and time. No distress.  HENT:  Head: Normocephalic and atraumatic.  Eyes: Conjunctivae are normal.  Neck: Neck supple. No thyromegaly present.  Cardiovascular: Normal rate, regular rhythm and normal heart sounds.  No murmur heard. Pulmonary/Chest: Effort normal and breath sounds normal. No respiratory distress.  Abdominal: He exhibits no distension and no mass. There is no tenderness.  Musculoskeletal: He exhibits no edema.  Neurological: He is alert and oriented to person, place, and time.  Skin: Skin is warm.  Psychiatric: Judgment normal.    BP 121/63 (BP Location: Left Arm, Patient Position: Sitting, Cuff Size: Large)   Pulse 100   Temp 98 F (36.7 C) (Oral)   Resp 16   Ht 5' 8.11" (1.73 m)   Wt 234 lb (106.1 kg)   SpO2 98%   BMI 35.46 kg/m  Wt Readings from Last 3 Encounters:  11/24/17 234 lb (106.1 kg)  08/11/17 227 lb 9.6 oz (103.2 kg)  08/01/17 230 lb (104.3 kg)   BP Readings from Last 3 Encounters:  11/24/17 121/63  08/11/17 130/68  08/01/17 (!) 143/73     Immunization History  Administered Date(s) Administered  . Influenza Whole 03/20/2010  . Influenza,inj,Quad PF,6+ Mos 05/01/2015,  06/22/2016, 04/18/2017  . Pneumococcal Conjugate-13 04/18/2017  . Tdap 05/01/2015    Health Maintenance  Topic Date Due  . PNEUMOCOCCAL POLYSACCHARIDE VACCINE (1) 11/03/1964  . FOOT EXAM  11/03/1972  . OPHTHALMOLOGY EXAM  11/03/1972  . HIV Screening  11/03/1977  . COLONOSCOPY  06/22/2016  . INFLUENZA VACCINE  02/16/2018  . HEMOGLOBIN A1C  05/27/2018  . TETANUS/TDAP  04/30/2025  . Hepatitis C Screening  Completed    Lab Results  Component Value Date   WBC 10.5 08/02/2017   HGB 13.0 08/02/2017   HCT 37.9 08/02/2017   PLT 404 (H) 08/02/2017   GLUCOSE 117 (H) 08/08/2017   CHOL 159 11/24/2017   TRIG (H) 11/24/2017    630.0 Triglyceride is over 400; calculations on Lipids are invalid.   HDL 28.70 (L) 11/24/2017   LDLDIRECT 49.0 11/24/2017   LDLCALC 31 08/08/2017   ALT 16 08/08/2017   AST 14 08/08/2017   NA 138 08/08/2017   K 3.9 08/08/2017   CL 104 08/08/2017   CREATININE 0.65 08/08/2017   BUN 14 08/08/2017   CO2 25 08/08/2017   TSH 2.17 11/24/2017   PSA 0.27 04/18/2017   INR 0.9 10/27/2014   HGBA1C 9.3 (H) 11/24/2017   MICROALBUR 0.50 01/08/2013    Lab Results  Component Value Date   TSH 2.17 11/24/2017   Lab Results  Component Value Date   WBC 10.5 08/02/2017   HGB 13.0 08/02/2017   HCT 37.9 08/02/2017   MCV 87 08/02/2017   PLT 404 (H) 08/02/2017   Lab Results  Component Value Date   NA 138 08/08/2017   K 3.9 08/08/2017   CO2 25 08/08/2017   GLUCOSE 117 (H) 08/08/2017   BUN 14 08/08/2017   CREATININE 0.65 08/08/2017   BILITOT 0.8 08/08/2017   ALKPHOS 59 08/08/2017   AST 14 08/08/2017   ALT 16 08/08/2017   PROT 7.2 08/08/2017   ALBUMIN 3.7 08/08/2017   CALCIUM 8.9 08/08/2017   ANIONGAP 4 (L) 10/30/2014   GFR 135.67 08/08/2017   Lab Results  Component Value Date   CHOL 159 11/24/2017   Lab Results  Component Value Date   HDL 28.70 (L) 11/24/2017   Lab Results  Component Value Date   LDLCALC 31 08/08/2017   Lab Results  Component Value  Date   TRIG (H) 11/24/2017    630.0 Triglyceride is over 400; calculations on Lipids are invalid.   Lab Results  Component Value Date   CHOLHDL 6 11/24/2017   Lab Results  Component Value Date   HGBA1C 9.3 (H) 11/24/2017         Assessment & Plan:   Problem List Items Addressed This Visit    Obesity    Encouraged DASH diet, decrease po intake and increase exercise as tolerated. Needs 7-8 hours of sleep nightly. Avoid trans fats, eat small, frequent meals every 4-5 hours with lean proteins, complex carbs and healthy fats. Minimize simple carbs      Diabetes mellitus type 2 in obese (HCC)    hgba1c acceptable, minimize simple carbs. Increase exercise as tolerated. Continue current meds      Relevant Medications   losartan (COZAAR) 50 MG tablet   Other Relevant Orders   Hemoglobin A1c (Completed)   Ambulatory referral to Cardiology   Hyperlipidemia, mixed - Primary    Tolerating statin, encouraged heart healthy diet, avoid trans fats, minimize simple carbs and saturated fats. Increase exercise as tolerated      Relevant Medications   losartan (COZAAR) 50 MG tablet   Other Relevant Orders   Lipid panel (Completed)   Ambulatory referral to Cardiology   Essential hypertension    Well controlled, no changes to meds. Encouraged heart healthy diet such as the DASH diet and exercise as tolerated.       Relevant Medications   losartan (COZAAR) 50 MG tablet   Other Relevant Orders   TSH (Completed)   Ambulatory referral to Cardiology   Palpitations   Relevant Orders   Ambulatory referral to Cardiology    Other Visit Diagnoses    History of colon cancer       Relevant Orders   Ambulatory referral to Gastroenterology      I have changed [name]'s losartan and diazepam. I am also having him start on busPIRone. Additionally, I am having him maintain his ONETOUCH [place] LANCETS, ONETOUCH VERIO, glucose blood, Insulin Pen Needle, ustekinumab, lisinopril, fenofibrate,  metFORMIN, metoprolol succinate, Insulin Detemir, Adalimumab, colesevelam, Insulin Glargine, meloxicam, oxyCODONE-acetaminophen, clobetasol cream, insulin aspart, escitalopram, Clobetasol Propionate, atorvastatin, methylPREDNISolone, omeprazole, zolpidem, and escitalopram.  Meds ordered this encounter  Medications  . losartan (COZAAR) 50 MG tablet    Sig: TAKE 1 TABLET (50 MG TOTAL) BY MOUTH DAILY. Blood Pressure    Dispense:  90 tablet    Refill:  1  . busPIRone (BUSPAR) 5 MG tablet    Sig: Take 1 tablet (5 mg total) by mouth 2 (two) times daily. Anxiety    Dispense:  60 tablet    Refill:  3  . zolpidem (AMBIEN) 10 MG  tablet    Sig: Take 1 tablet (10 mg total) by mouth at bedtime as needed.    Dispense:  30 tablet    Refill:  3  . escitalopram (LEXAPRO) 10 MG tablet    Sig: Take 1 tablet (10 mg total) by mouth daily.    Dispense:  90 tablet    Refill:  1  . diazepam (VALIUM) 5 MG tablet    Sig: Take 1 tablet (5 mg total) by mouth at bedtime as needed for anxiety.    Dispense:  30 tablet    Refill:  5    CMA served as scribe during this visit. History, Physical and Plan performed by medical provider. Documentation and orders reviewed and attested to.  Penni Homans, MD

## 2017-11-25 LAB — LIPID PANEL
CHOL/HDL RATIO: 6
Cholesterol: 159 mg/dL (ref 0–200)
HDL: 28.7 mg/dL — ABNORMAL LOW (ref >39.00)

## 2017-11-25 LAB — LDL CHOLESTEROL, DIRECT: Direct LDL: 49 mg/dL

## 2017-11-25 LAB — HEMOGLOBIN A1C: HEMOGLOBIN A1C: 9.3 % — AB (ref 4.6–6.5)

## 2017-11-25 LAB — TSH: TSH: 2.17 u[IU]/mL (ref 0.35–4.50)

## 2017-11-27 NOTE — Assessment & Plan Note (Signed)
Tolerating statin, encouraged heart healthy diet, avoid trans fats, minimize simple carbs and saturated fats. Increase exercise as tolerated 

## 2017-11-27 NOTE — Assessment & Plan Note (Signed)
Encouraged DASH diet, decrease po intake and increase exercise as tolerated. Needs 7-8 hours of sleep nightly. Avoid trans fats, eat small, frequent meals every 4-5 hours with lean proteins, complex carbs and healthy fats. Minimize simple carbs 

## 2017-11-27 NOTE — Assessment & Plan Note (Signed)
hgba1c acceptable, minimize simple carbs. Increase exercise as tolerated. Continue current meds 

## 2017-11-27 NOTE — Assessment & Plan Note (Signed)
Well controlled, no changes to meds. Encouraged heart healthy diet such as the DASH diet and exercise as tolerated.  °

## 2017-11-30 ENCOUNTER — Encounter: Payer: Self-pay | Admitting: Family Medicine

## 2017-12-02 ENCOUNTER — Other Ambulatory Visit: Payer: Self-pay | Admitting: Family Medicine

## 2017-12-02 MED ORDER — FENOFIBRATE 160 MG PO TABS: 160.0000 mg | ORAL_TABLET | Freq: Every day | ORAL | 3 refills | Status: DC

## 2017-12-02 MED ORDER — METHYLPREDNISOLONE 4 MG PO TABS: ORAL_TABLET | ORAL | 0 refills | Status: DC

## 2017-12-06 NOTE — Progress Notes (Signed)
Cardiology Office Note:    Date:  12/07/2017   ID:  [name], DOB June 12, 1961, MRN [id number]  PCP:  Mosie Lukes, MD  Cardiologist:  Shirlee More, MD   Referring MD: Mosie Lukes, MD  ASSESSMENT:    1. Chest pain, unspecified type   2. Hyperlipidemia, mixed   3. Essential hypertension   4. Diabetes mellitus type 2 in obese [facility])    PLAN:    In order of problems listed above:  1. In view of his multiple cardiovascular risk and symptoms he will undergo a stress echo to define if he has evidence of coronary artery disease.  I asked him to continue his current antihypertensives lipid-lowering therapy diabetic treatment and follow-up in the office to review the results in 3 to 4 weeks 2. Stable compliant continue current combined lipid-lowering therapy 3. Stable continue current antihypertensive including ACE inhibitor 4. Stable managed by his PCP  Next appointment 4 weeks    Medication Adjustments/Labs and Tests Ordered: Current medicines are reviewed at length with the patient today.  Concerns regarding medicines are outlined above.  Orders Placed This Encounter  Procedures  . EKG 12-Lead  . ECHOCARDIOGRAM STRESS TEST   No orders of the defined types were placed in this encounter.    Chief Complaint  Patient presents with  . New Patient (Initial Visit)    History of Present Illness:    [name] is a 57 y.o. male with T2 DM hypertension and hyperlipidemiawho is being seen today for the evaluation of palpitation at the request of Mosie Lukes, MD. Office 11/24/17: Palpitations    Relevant Orders   Ambulatory referral to Cardiology   He relates a long history of being aware of his heart beating he describes as beating fast and hard spelt presence of prolonged time for at least since 2011 particularly bothersome at night as he has trouble sleeping and chronic insomnia.  We discussed the potential that he has underlying arrhythmia and advised him to  undergo a cardiac event monitor he declines.  His primary concern is that he thinks he has underlying heart disease this is influenced by the fact that he gets vague chest discomfort and marked exercise intolerance that he finds limiting when he tries to do vigorous activity as well as to work associates and recently has had acute cardiac event and bypass surgery.  In visit view of his marked exercise intolerance vague chest discomfort and multiple cardiovascular risk to undergo a stress echo for further evaluation.  He has no known history of congenital rheumatic heart disease no edema orthopnea syncope or TIA he describes a vague feeling in his chest and marked exercise intolerance when he does vigorous activity no radiation no diaphoresis nausea or vomiting the symptoms have progressed and now are limiting  Past Medical History:  Diagnosis Date  . Anemia 10/03/2009   Qualifier: Diagnosis of  By: [name]   . Anxiety associated with depression 10/11/2013  . Colon cancer ([place])    right colon cancer- adenocarcinoma CEA level isnrmal at 1.8  . Diabetes mellitus   . Diabetes mellitus type 2 in obese [facility]) 03/20/2010   Qualifier: Diagnosis of  By: [name]    . Erectile dysfunction 06/22/2016  . Essential hypertension 06/05/2014  . FATTY LIVER DISEASE 11/04/2009   Qualifier: Diagnosis of  By: [name] MD, Docia Chuck   Qualifier: Diagnosis of  By: [name] MD, Docia Chuck  Last Assessment & Plan:  conirmed by CT scan of abdomen in April 2016 encouraged to minimize simple carbs and fatty foods.  Marland Kitchen GERD 11/04/2009   Qualifier: Diagnosis of  By: [name] MD, Docia Chuck   . Great toe pain, right 04/18/2017  . Hepatic artery stenosis ([place])   . History of colon cancer 01/02/2010   Qualifier: Diagnosis of  By: [name] Dr [name]  Last Assessment & Plan:  Follows closely with gastroenterology, Dr [name]  . Hyperlipidemia   . Hyperlipidemia, mixed 09/07/2010   Qualifier: Diagnosis of  By: [name]     . Hypertension   . Hypertriglyceridemia 12/14/2010  . Hypotestosteronism 06/22/2016  . INSOMNIA, CHRONIC 03/20/2010   Qualifier: Diagnosis of  By: [name] Ambien 10 mg daily does not keep asleep AdvilPM is over sedating and has trouble waking up   . Iron deficiency anemia 2011  . Low testosterone 06/22/2016  . Malignant neoplasm of colon ([place]) 01/02/2010   Qualifier: Diagnosis of  By: [name] Dr [name]   . Muscle spasm of back 06/05/2014  . Nipple pain 09/30/2016  . Obesity   . Palpitations 06/05/2014  . Preventative health care 10/08/2012  . Psoriasis   . Psoriatic arthritis ([place]) 08/25/2017  . Right knee pain 11/17/2011    Past Surgical History:  Procedure Laterality Date  . CARPAL TUNNEL RELEASE     RIGHT  . CHOLECYSTECTOMY  1994  . COLONOSCOPY    . HEMICOLECTOMY     12/17/2009 right  . POLYPECTOMY      Current Medications: Current Meds  Medication Sig  . atorvastatin (LIPITOR) 20 MG tablet Take 1 tablet (20 mg total) by mouth daily.  . Blood Glucose Monitoring Suppl (ONETOUCH VERIO) W/DEVICE KIT   . busPIRone (BUSPAR) 5 MG tablet Take 1 tablet (5 mg total) by mouth 2 (two) times daily. Anxiety  . clobetasol cream (TEMOVATE) 0.05 %   . Clobetasol Propionate (CLOBEX SPRAY) 0.05 % external spray   . diazepam (VALIUM) 5 MG tablet Take 1 tablet (5 mg total) by mouth at bedtime as needed for anxiety.  . fenofibrate 160 MG tablet Take 1 tablet (160 mg total) by mouth daily.  Marland Kitchen glucose blood (ONETOUCH VERIO) test strip Test three times daily, DX E11.9  . Insulin Pen Needle (B-D ULTRAFINE III SHORT PEN) 31G X 8 MM MISC Test as directed three times daily.  DX E11.9  . [place] LANCETS MISC   . Secukinumab (COSENTYX 300 DOSE) 150 MG/ML SOSY Inject 300 mg into the skin once monthly.  . ustekinumab (STELARA) 90 MG/ML SOSY injection Inject 90 mg into the skin every 30 (thirty) days.   Marland Kitchen zolpidem (AMBIEN) 10 MG tablet Take 1 tablet (10 mg total) by mouth at bedtime as  needed.     Allergies:   Patient has no known allergies.   Social History   Socioeconomic History  . Marital status: Single    Spouse name: [name]  . Number of children: 0  . Years of education: Not on file  . Highest education level: Not on file  Occupational History  . Occupation: Merchant navy officer    Comment: Recruitment consultant  Social Needs  . Financial resource strain: Not on file  . Food insecurity:    Worry: Not on file    Inability: Not on file  . Transportation needs:    Medical: Not on file    Non-medical: Not on file  Tobacco Use  . Smoking  status: Current Some Day Smoker    Types: Cigarettes  . Smokeless tobacco: Never Used  Substance and Sexual Activity  . Alcohol use: No  . Drug use: No  . Sexual activity: Yes    Comment: lives with girlfriend, no dietary restrictions  Lifestyle  . Physical activity:    Days per week: Not on file    Minutes per session: Not on file  . Stress: Not on file  Relationships  . Social connections:    Talks on phone: Not on file    Gets together: Not on file    Attends religious service: Not on file    Active member of club or organization: Not on file    Attends meetings of clubs or organizations: Not on file    Relationship status: Not on file  Other Topics Concern  . Not on file  Social History Narrative   Occupation: Theatre stage manager and fax)   Single  (lives with [name])   no children    former smoker   Illicit Drug Use - no      Family History: The patient's family history includes Alcohol abuse in his brother; Diabetes in his brother; Diabetes type II in his brother; Stomach cancer in his mother. There is no history of Other, Coronary artery disease, or Cancer.  ROS:   Review of Systems  Constitution: Positive for malaise/fatigue.  HENT: Negative.   Eyes: Negative.   Cardiovascular: Positive for near-syncope (marked exercise intolerance) and palpitations.  Respiratory: Negative.   Hematologic/Lymphatic: Negative.    Skin: Negative.   Musculoskeletal: Negative.   Gastrointestinal: Negative.   Genitourinary: Negative.   Neurological: Negative.   Psychiatric/Behavioral: Negative.   Allergic/Immunologic: Negative.    Please see the history of present illness.     All other systems reviewed and are negative.  EKGs/Labs/Other Studies Reviewed:    The following studies were reviewed today: PCP office note and labs reviewed.  EKG:  EKG is  ordered today.  The ekg ordered today sinus rhythm normal   Recent Labs: 08/02/2017: Hemoglobin 13.0; Platelets 404 08/08/2017: ALT 16; BUN 14; Creatinine, Ser 0.65; Potassium 3.9; Sodium 138 11/24/2017: TSH 2.17  Recent Lipid Panel    Component Value Date/Time   CHOL 159 11/24/2017 1641   CHOL 949 (H) 10/27/2014 1322   TRIG (H) 11/24/2017 1641    630.0 Triglyceride is over 400; calculations on Lipids are invalid.   TRIG 1,098 (H) 10/30/2014 0605   HDL 28.70 (L) 11/24/2017 1641   HDL 20 (L) 10/27/2014 1322   CHOLHDL 6 11/24/2017 1641   VLDL 22.2 08/08/2017 1124   VLDL SEE COMMENT 10/27/2014 1322   LDLCALC 31 08/08/2017 1124   LDLCALC SEE COMMENT 10/27/2014 1322   LDLDIRECT 49.0 11/24/2017 1641    Physical Exam:    VS:  BP 108/68 (BP Location: Left Arm, Patient Position: Sitting, Cuff Size: Large)   Pulse 80   Ht '5\' 8"'  (1.727 m)   Wt 230 lb 12.8 oz (104.7 kg)   SpO2 98%   BMI 35.09 kg/m     Wt Readings from Last 3 Encounters:  12/07/17 230 lb 12.8 oz (104.7 kg)  11/24/17 234 lb (106.1 kg)  08/11/17 227 lb 9.6 oz (103.2 kg)     GEN:  Well nourished, well developed in no acute distress HEENT: Normal NECK: No JVD; No carotid bruits LYMPHATICS: No lymphadenopathy CARDIAC: distant RRR, no murmurs, rubs, gallops RESPIRATORY:  Clear to auscultation without rales, wheezing or rhonchi  ABDOMEN:  Soft, non-tender, non-distended MUSCULOSKELETAL:  No edema; No deformity  SKIN: Warm and dry NEUROLOGIC:  Alert and oriented x 3 PSYCHIATRIC:  Normal  affect     Signed, Shirlee More, MD  12/07/2017 4:26 PM    [place]

## 2017-12-07 ENCOUNTER — Encounter: Payer: Self-pay | Admitting: Cardiology

## 2017-12-07 ENCOUNTER — Ambulatory Visit (INDEPENDENT_AMBULATORY_CARE_PROVIDER_SITE_OTHER): Payer: BLUE CROSS/BLUE SHIELD | Admitting: Cardiology

## 2017-12-07 VITALS — BP 108/68 | HR 80 | Ht 68.0 in | Wt 230.8 lb

## 2017-12-07 DIAGNOSIS — R079 Chest pain, unspecified: Secondary | ICD-10-CM | POA: Diagnosis not present

## 2017-12-07 DIAGNOSIS — I10 Essential (primary) hypertension: Secondary | ICD-10-CM

## 2017-12-07 DIAGNOSIS — E782 Mixed hyperlipidemia: Secondary | ICD-10-CM | POA: Diagnosis not present

## 2017-12-07 DIAGNOSIS — E669 Obesity, unspecified: Secondary | ICD-10-CM

## 2017-12-07 DIAGNOSIS — E1169 Type 2 diabetes mellitus with other specified complication: Secondary | ICD-10-CM | POA: Diagnosis not present

## 2017-12-07 NOTE — Progress Notes (Signed)
Reviewed medications with patient, and he states that he takes his medications "on some days, not everyday."  He states that he "does not want to fill my belly with medication that I do not know what it is for."  I made Dr Bettina Gavia aware that patient does not take medications consistently.

## 2017-12-07 NOTE — Patient Instructions (Signed)
Medication Instructions:  Your physician recommends that you continue on your current medications as directed. Please refer to the Current Medication list given to you today.  Labwork: None  Testing/Procedures: You had an EKG today.  Your physician has requested that you have a stress echocardiogram. For further information please visit [email or web address]. Please follow instruction sheet as given.  Follow-Up: Your physician recommends that you schedule a follow-up appointment in: 4 weeks.  Any Other Special Instructions Will Be Listed Below (If Applicable).     If you need a refill on your cardiac medications before your next appointment, please call your pharmacy.

## 2017-12-23 DIAGNOSIS — M62830 Muscle spasm of back: Secondary | ICD-10-CM | POA: Diagnosis not present

## 2017-12-23 DIAGNOSIS — M9901 Segmental and somatic dysfunction of cervical region: Secondary | ICD-10-CM | POA: Diagnosis not present

## 2017-12-23 DIAGNOSIS — M9902 Segmental and somatic dysfunction of thoracic region: Secondary | ICD-10-CM | POA: Diagnosis not present

## 2017-12-23 DIAGNOSIS — M5033 Other cervical disc degeneration, cervicothoracic region: Secondary | ICD-10-CM | POA: Diagnosis not present

## 2017-12-26 ENCOUNTER — Ambulatory Visit (HOSPITAL_BASED_OUTPATIENT_CLINIC_OR_DEPARTMENT_OTHER)
Admission: RE | Admit: 2017-12-26 | Discharge: 2017-12-26 | Disposition: A | Discharge status: Home / Self Care | Payer: BLUE CROSS/BLUE SHIELD | Source: Ambulatory Visit | Attending: Cardiology | Admitting: Cardiology

## 2017-12-26 DIAGNOSIS — E119 Type 2 diabetes mellitus without complications: Secondary | ICD-10-CM | POA: Diagnosis not present

## 2017-12-26 DIAGNOSIS — E785 Hyperlipidemia, unspecified: Secondary | ICD-10-CM | POA: Insufficient documentation

## 2017-12-26 DIAGNOSIS — I10 Essential (primary) hypertension: Secondary | ICD-10-CM | POA: Diagnosis not present

## 2017-12-26 DIAGNOSIS — R079 Chest pain, unspecified: Secondary | ICD-10-CM | POA: Insufficient documentation

## 2017-12-26 NOTE — Progress Notes (Signed)
  Echocardiogram Echocardiogram Stress Test with limited exam has been performed.  Joelene Millin 12/26/2017, 2:23 PM

## 2017-12-27 ENCOUNTER — Ambulatory Visit: Payer: BLUE CROSS/BLUE SHIELD | Admitting: Family Medicine

## 2018-01-06 ENCOUNTER — Other Ambulatory Visit: Payer: Self-pay | Admitting: Family Medicine

## 2018-01-06 DIAGNOSIS — E782 Mixed hyperlipidemia: Secondary | ICD-10-CM

## 2018-01-06 DIAGNOSIS — E1169 Type 2 diabetes mellitus with other specified complication: Secondary | ICD-10-CM

## 2018-01-06 DIAGNOSIS — E669 Obesity, unspecified: Secondary | ICD-10-CM

## 2018-01-06 DIAGNOSIS — I10 Essential (primary) hypertension: Secondary | ICD-10-CM

## 2018-01-06 DIAGNOSIS — R002 Palpitations: Secondary | ICD-10-CM

## 2018-01-06 DIAGNOSIS — K859 Acute pancreatitis without necrosis or infection, unspecified: Secondary | ICD-10-CM

## 2018-01-16 ENCOUNTER — Encounter: Payer: BLUE CROSS/BLUE SHIELD | Admitting: Internal Medicine

## 2018-01-16 DIAGNOSIS — M5033 Other cervical disc degeneration, cervicothoracic region: Secondary | ICD-10-CM | POA: Diagnosis not present

## 2018-01-16 DIAGNOSIS — M62830 Muscle spasm of back: Secondary | ICD-10-CM | POA: Diagnosis not present

## 2018-01-16 DIAGNOSIS — M9901 Segmental and somatic dysfunction of cervical region: Secondary | ICD-10-CM | POA: Diagnosis not present

## 2018-01-16 DIAGNOSIS — M9902 Segmental and somatic dysfunction of thoracic region: Secondary | ICD-10-CM | POA: Diagnosis not present

## 2018-01-18 ENCOUNTER — Other Ambulatory Visit: Payer: Self-pay

## 2018-01-18 MED ORDER — INSULIN DETEMIR 100 UNIT/ML FLEXPEN: 60.0000 [IU] | PEN_INJECTOR | Freq: Every day | SUBCUTANEOUS | 3 refills | Status: DC

## 2018-01-25 ENCOUNTER — Other Ambulatory Visit: Payer: Self-pay | Admitting: Family Medicine

## 2018-01-31 DIAGNOSIS — M9901 Segmental and somatic dysfunction of cervical region: Secondary | ICD-10-CM | POA: Diagnosis not present

## 2018-01-31 DIAGNOSIS — M62830 Muscle spasm of back: Secondary | ICD-10-CM | POA: Diagnosis not present

## 2018-01-31 DIAGNOSIS — M9902 Segmental and somatic dysfunction of thoracic region: Secondary | ICD-10-CM | POA: Diagnosis not present

## 2018-01-31 DIAGNOSIS — M5033 Other cervical disc degeneration, cervicothoracic region: Secondary | ICD-10-CM | POA: Diagnosis not present

## 2018-02-01 ENCOUNTER — Other Ambulatory Visit: Payer: Self-pay | Admitting: Family Medicine

## 2018-02-05 DIAGNOSIS — R079 Chest pain, unspecified: Secondary | ICD-10-CM | POA: Insufficient documentation

## 2018-02-05 NOTE — Progress Notes (Signed)
Cardiology Office Note:    Date:  02/06/2018   ID:  [name], DOB 28-Oct-1960, MRN [id number]  PCP:  Frank Lukes, MD  Cardiologist:  Frank More, MD    Referring MD: Frank Lukes, MD    ASSESSMENT:    1. Palpitation   2. Essential hypertension   3. Hyperlipidemia, mixed   4. Diabetes mellitus type 2 in obese (HCC)   5. Chest pain in adult    PLAN:    In order of problems listed above:  1. He continues to be bothered by palpitation has decided to pursue this for 14-day extended Holter monitor to define if arrhythmia is present and to guide treatment. 2. He had a hypertensive blood pressure response likely related to noncompliance of medications we discussed techniques including a pillbox and reminders for medications.  Today his blood pressures at target 3. Stable managed by his PCP 4. Stable continue statin 5. He has had no further chest pain his stress echo was low risk and asked about additional testing and I think what he was driving it was a cardiac CTA in the absence of symptoms and a normal stress echo I would not advise at this time.   Next appointment: 6 months   Medication Adjustments/Labs and Tests Ordered: Current medicines are reviewed at length with the patient today.  Concerns regarding medicines are outlined above.  Orders Placed This Encounter  Procedures  . LONG TERM MONITOR (3-14 DAYS)   No orders of the defined types were placed in this encounter.   Chief Complaint  Patient presents with  . Follow-up    after stress test    History of Present Illness:    [name] is a 57 y.o. male with a hx of T2 DM hypertension hyperlipidemia palpitation  and chest pain last seen 12/07/2017. His stress echo 06/10 19 was normal. Compliance with diet, lifestyle and medications: Yes  He has had no further chest pain.  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  He has no bruit on exam and has had no TIA-like symptoms.  He has had no further chest pain and his stress echo was normal in terms of exercise tolerance EKG echo images and no chest pain.  His blood pressure response was excessive and he acknowledges that he misses medications and we discussed techniques including a pillbox and reminder.  He stresses again that he is having palpitation at bedtime and wants to know why he is a normal stress echo and continues to have symptoms and went back to my original note he has had the symptoms since 2011 at the time of his last visit he declined further evaluation is somewhat ambivalent today but accepts an ambulatory heart rhythm monitor and follow-up in the office. Past Medical History:  Diagnosis Date  . Anemia 10/03/2009   Qualifier: Diagnosis of  By: [name]   . Anxiety associated with depression 10/11/2013  . Colon cancer ([place])    right colon cancer- adenocarcinoma CEA level isnrmal at 1.8  . Diabetes mellitus   . Diabetes mellitus type 2 in obese [facility]) 03/20/2010   Qualifier: Diagnosis of  By: [name]    . Erectile dysfunction 06/22/2016  . Essential hypertension 06/05/2014  . FATTY LIVER DISEASE 11/04/2009   Qualifier: Diagnosis of  By: [name] MD, Docia Chuck   Qualifier: Diagnosis  of  By: [name] MD, [place]:  conirmed by CT scan of abdomen in April 2016 encouraged to minimize simple carbs and fatty foods.  Marland Kitchen GERD 11/04/2009   Qualifier: Diagnosis of  By: [name] MD, Docia Chuck   . Great toe pain, right 04/18/2017  . Hepatic artery stenosis ([place])   . History of colon cancer 01/02/2010   Qualifier: Diagnosis of  By: [name] Dr [name]  Last Assessment & Plan:  Follows closely with gastroenterology, Dr [name]  . Hyperlipidemia   . Hyperlipidemia, mixed 09/07/2010   Qualifier: Diagnosis of  By: [name]   . Hypertension   . Hypertriglyceridemia 12/14/2010  . Hypotestosteronism 06/22/2016  .  INSOMNIA, CHRONIC 03/20/2010   Qualifier: Diagnosis of  By: [name] Ambien 10 mg daily does not keep asleep AdvilPM is over sedating and has trouble waking up   . Iron deficiency anemia 2011  . Low testosterone 06/22/2016  . Malignant neoplasm of colon ([place]) 01/02/2010   Qualifier: Diagnosis of  By: [name] Dr [name]   . Muscle spasm of back 06/05/2014  . Nipple pain 09/30/2016  . Obesity   . Palpitations 06/05/2014  . Preventative health care 10/08/2012  . Psoriasis   . Psoriatic arthritis ([place]) 08/25/2017  . Right knee pain 11/17/2011    Past Surgical History:  Procedure Laterality Date  . CARPAL TUNNEL RELEASE     RIGHT  . CHOLECYSTECTOMY  1994  . COLONOSCOPY    . HEMICOLECTOMY     12/17/2009 right  . POLYPECTOMY      Current Medications: Current Meds  Medication Sig  . atorvastatin (LIPITOR) 20 MG tablet Take 1 tablet (20 mg total) by mouth daily.  . Blood Glucose Monitoring Suppl (ONETOUCH VERIO) W/DEVICE KIT   . clobetasol cream (TEMOVATE) 0.05 %   . Clobetasol Propionate (CLOBEX SPRAY) 0.05 % external spray   . diazepam (VALIUM) 5 MG tablet Take 1 tablet (5 mg total) by mouth at bedtime as needed for anxiety.  . fenofibrate 160 MG tablet Take 1 tablet (160 mg total) by mouth daily.  Marland Kitchen glimepiride (AMARYL) 1 MG tablet TAKE 1 TABLET BY MOUTH EVERY DAY BEFORE BREAKFAST  . glucose blood (ONETOUCH VERIO) test strip Test three times daily, DX E11.9  . insulin aspart (NOVOLOG FLEXPEN) 100 UNIT/ML FlexPen Inject into the skin.   . Insulin Detemir (LEVEMIR FLEXTOUCH) 100 UNIT/ML Pen Inject 60 Units into the skin daily at 10 pm.  . Insulin Glargine (LANTUS SOLOSTAR) 100 UNIT/ML Solostar Pen Inject into the skin.  . Insulin Pen Needle (B-D ULTRAFINE III SHORT PEN) 31G X 8 MM MISC Test as directed three times daily.  DX E11.9  . lisinopril (PRINIVIL,ZESTRIL) 5 MG tablet TAKE 1 TABLET (5 MG TOTAL) BY MOUTH DAILY.  Marland Kitchen losartan (COZAAR) 50 MG tablet TAKE 1 TABLET (50 MG  TOTAL) BY MOUTH DAILY. Blood Pressure  . meloxicam (MOBIC) 15 MG tablet Take 15 mg by mouth daily.   . metFORMIN (GLUCOPHAGE) 1000 MG tablet TAKE 1 TABLET (1,000 MG TOTAL) BY MOUTH 2 (TWO) TIMES DAILY WITH A MEAL.  . metoprolol succinate (TOPROL-XL) 25 MG 24 hr tablet TAKE 1 TABLET (25 MG TOTAL) BY MOUTH DAILY.  Marland Kitchen NOVOLOG FLEXPEN 100 UNIT/ML FlexPen INJECT 10-16 UNITS UNDER SKIN 3 TIMES A DAY BEFORE MEALS  . omeprazole (PRILOSEC) 40 MG capsule Take 1 capsule (40 mg total) by mouth 2 (two) times daily. (Patient  taking differently: Take 40 mg by mouth 2 (two) times daily. As needed)  . [place] LANCETS MISC   . Secukinumab (COSENTYX 300 DOSE) 150 MG/ML SOSY Inject 300 mg into the skin once monthly.  . zolpidem (AMBIEN) 10 MG tablet Take 1 tablet (10 mg total) by mouth at bedtime as needed.     Allergies:   Patient has no known allergies.   Social History   Socioeconomic History  . Marital status: Single    Spouse name: [name]  . Number of children: 0  . Years of education: Not on file  . Highest education level: Not on file  Occupational History  . Occupation: Merchant navy officer    Comment: Recruitment consultant  Social Needs  . Financial resource strain: Not on file  . Food insecurity:    Worry: Not on file    Inability: Not on file  . Transportation needs:    Medical: Not on file    Non-medical: Not on file  Tobacco Use  . Smoking status: Current Some Day Smoker    Types: Cigarettes  . Smokeless tobacco: Never Used  Substance and Sexual Activity  . Alcohol use: No  . Drug use: No  . Sexual activity: Yes    Comment: lives with girlfriend, no dietary restrictions  Lifestyle  . Physical activity:    Days per week: Not on file    Minutes per session: Not on file  . Stress: Not on file  Relationships  . Social connections:    Talks on phone: Not on file    Gets together: Not on file    Attends religious service: Not on file    Active member of club or organization: Not on file      Attends meetings of clubs or organizations: Not on file    Relationship status: Not on file  Other Topics Concern  . Not on file  Social History Narrative   Occupation: Theatre stage manager and fax)   Single  (lives with [name])   no children    former smoker   Illicit Drug Use - no      Family History: The patient's family history includes Alcohol abuse in his brother; Diabetes in his brother; Diabetes type II in his brother; Stomach cancer in his mother. There is no history of Other, Coronary artery disease, or Cancer. ROS:   Please see the history of present illness.    All other systems reviewed and are negative.  EKGs/Labs/Other Studies Reviewed:    The following studies were reviewed today:  Recent stress echo normal exercise tolerance EKG and echo images  Recent Labs: 08/02/2017: Hemoglobin 13.0; Platelets 404 08/08/2017: ALT 16; BUN 14; Creatinine, Ser 0.65; Potassium 3.9; Sodium 138 11/24/2017: TSH 2.17  Recent Lipid Panel    Component Value Date/Time   CHOL 159 11/24/2017 1641   CHOL 949 (H) 10/27/2014 1322   TRIG (H) 11/24/2017 1641    630.0 Triglyceride is over 400; calculations on Lipids are invalid.   TRIG 1,098 (H) 10/30/2014 0605   HDL 28.70 (L) 11/24/2017 1641   HDL 20 (L) 10/27/2014 1322   CHOLHDL 6 11/24/2017 1641   VLDL 22.2 08/08/2017 1124   VLDL SEE COMMENT 10/27/2014 1322   LDLCALC 31 08/08/2017 1124   LDLCALC SEE COMMENT 10/27/2014 1322   LDLDIRECT 49.0 11/24/2017 1641    Physical Exam:    VS:  BP 126/80 (BP Location: Right Arm, Patient Position: Sitting, Cuff Size: Large)   Pulse (!) 103  Ht '5\' 8"'  (1.727 m)   Wt 233 lb (105.7 kg)   SpO2 98%   BMI 35.43 kg/m     Wt Readings from Last 3 Encounters:  02/06/18 233 lb (105.7 kg)  12/07/17 230 lb 12.8 oz (104.7 kg)  11/24/17 234 lb (106.1 kg)     GEN:  Well nourished, well developed in no acute distress HEENT: Normal NECK: No JVD; No carotid bruits LYMPHATICS: No  lymphadenopathy CARDIAC: RRR, no murmurs, rubs, gallops RESPIRATORY:  Clear to auscultation without rales, wheezing or rhonchi  ABDOMEN: Soft, non-tender, non-distended MUSCULOSKELETAL:  No edema; No deformity  SKIN: Warm and dry NEUROLOGIC:  Alert and oriented x 3 PSYCHIATRIC:  Normal affect    Signed, Frank More, MD  02/06/2018 8:40 AM    [place]

## 2018-02-06 ENCOUNTER — Ambulatory Visit (INDEPENDENT_AMBULATORY_CARE_PROVIDER_SITE_OTHER): Payer: BLUE CROSS/BLUE SHIELD | Admitting: Cardiology

## 2018-02-06 ENCOUNTER — Encounter: Payer: Self-pay | Admitting: Cardiology

## 2018-02-06 VITALS — BP 126/80 | HR 103 | Ht 68.0 in | Wt 233.0 lb

## 2018-02-06 DIAGNOSIS — E782 Mixed hyperlipidemia: Secondary | ICD-10-CM | POA: Diagnosis not present

## 2018-02-06 DIAGNOSIS — I10 Essential (primary) hypertension: Secondary | ICD-10-CM | POA: Diagnosis not present

## 2018-02-06 DIAGNOSIS — E669 Obesity, unspecified: Secondary | ICD-10-CM | POA: Diagnosis not present

## 2018-02-06 DIAGNOSIS — R079 Chest pain, unspecified: Secondary | ICD-10-CM | POA: Diagnosis not present

## 2018-02-06 DIAGNOSIS — E1169 Type 2 diabetes mellitus with other specified complication: Secondary | ICD-10-CM | POA: Diagnosis not present

## 2018-02-06 DIAGNOSIS — R002 Palpitations: Secondary | ICD-10-CM

## 2018-02-06 NOTE — Patient Instructions (Addendum)
Medication Instructions:  Your physician recommends that you continue on your current medications as directed. Please refer to the Current Medication list given to you today.   Labwork: None.  Testing/Procedures: Your physician has recommended that you wear a holter monitor. Holter monitors are medical devices that record the heart's electrical activity. Doctors most often use these monitors to diagnose arrhythmias. Arrhythmias are problems with the speed or rhythm of the heartbeat. The monitor is a small, portable device. You can wear one while you do your normal daily activities. This is usually used to diagnose what is causing palpitations/syncope (passing out). Wear for 14 days.  Follow-Up: Your physician recommends that you schedule a follow-up appointment in: 6 weeks.  Any Other Special Instructions Will Be Listed Below (If Applicable).     If you need a refill on your cardiac medications before your next appointment, please call your pharmacy.   Holter Monitoring A Holter monitor is a small device that is used to detect abnormal heart rhythms. It clips to your clothing and is connected by wires to flat, sticky disks (electrodes) that attach to your chest. It is worn continuously for 24-48 hours. Follow these instructions at home:  Wear your Holter monitor at all times, even while exercising and sleeping, for as long as directed by your health care provider.  Make sure that the Holter monitor is safely clipped to your clothing or close to your body as recommended by your health care provider.  Do not get the monitor or wires wet.  Do not put body lotion or moisturizer on your chest.  Keep your skin clean.  Keep a diary of your daily activities, such as walking and doing chores. If you feel that your heartbeat is abnormal or that your heart is fluttering or skipping a beat: ? Record what you are doing when it happens. ? Record what time of day the symptoms occur.  Return  your Holter monitor as directed by your health care provider.  Keep all follow-up visits as directed by your health care provider. This is important. Get help right away if:  You feel lightheaded or you faint.  You have trouble breathing.  You feel pain in your chest, upper arm, or jaw.  You feel sick to your stomach and your skin is pale, cool, or damp.  You heartbeat feels unusual or abnormal. This information is not intended to replace advice given to you by your health care provider. Make sure you discuss any questions you have with your health care provider. Document Released: 04/02/2004 Document Revised: 12/11/2015 Document Reviewed: 02/11/2014 Elsevier Interactive Patient Education  2018 [employer].        Consider a Life Line screening

## 2018-02-16 ENCOUNTER — Other Ambulatory Visit: Payer: Self-pay | Admitting: Family Medicine

## 2018-02-19 ENCOUNTER — Other Ambulatory Visit: Payer: Self-pay | Admitting: Family Medicine

## 2018-02-20 NOTE — Telephone Encounter (Signed)
Pt requesting rx for prednisolone.

## 2018-02-20 NOTE — Telephone Encounter (Signed)
Need to know why he is requesting

## 2018-02-22 ENCOUNTER — Other Ambulatory Visit: Payer: Self-pay

## 2018-02-22 NOTE — Telephone Encounter (Signed)
Pt states he's requesting methylprednisolone 4 mg tablets for his psoriatic arthritis. He states that he receives monthly injections of cosentyx for his psoriatic arthritis but that it wears off by the 3rd week after getting the injection and he would like to have this medication refilled to help with this each month. Pt wants Dr. Charlett Blake to know that he will only take the methylprednisolone when absolutely necessary and that it has really helped him in the past with his psoriatic arthritis.   Please advise.

## 2018-03-29 DIAGNOSIS — L4050 Arthropathic psoriasis, unspecified: Secondary | ICD-10-CM | POA: Diagnosis not present

## 2018-03-29 DIAGNOSIS — Z85038 Personal history of other malignant neoplasm of large intestine: Secondary | ICD-10-CM | POA: Diagnosis not present

## 2018-03-29 DIAGNOSIS — L409 Psoriasis, unspecified: Secondary | ICD-10-CM | POA: Diagnosis not present

## 2018-03-29 DIAGNOSIS — Z79899 Other long term (current) drug therapy: Secondary | ICD-10-CM | POA: Diagnosis not present

## 2018-03-30 ENCOUNTER — Ambulatory Visit (INDEPENDENT_AMBULATORY_CARE_PROVIDER_SITE_OTHER): Payer: BLUE CROSS/BLUE SHIELD | Admitting: Family Medicine

## 2018-03-30 ENCOUNTER — Encounter: Payer: Self-pay | Admitting: Family Medicine

## 2018-03-30 DIAGNOSIS — I10 Essential (primary) hypertension: Secondary | ICD-10-CM | POA: Diagnosis not present

## 2018-03-30 DIAGNOSIS — E782 Mixed hyperlipidemia: Secondary | ICD-10-CM | POA: Diagnosis not present

## 2018-03-30 DIAGNOSIS — E669 Obesity, unspecified: Secondary | ICD-10-CM

## 2018-03-30 DIAGNOSIS — E1169 Type 2 diabetes mellitus with other specified complication: Secondary | ICD-10-CM

## 2018-03-30 DIAGNOSIS — L4050 Arthropathic psoriasis, unspecified: Secondary | ICD-10-CM

## 2018-03-30 LAB — LIPID PANEL
CHOL/HDL RATIO: 5
CHOLESTEROL: 207 mg/dL — AB (ref 0–200)
HDL: 40.8 mg/dL (ref >39.00)
NONHDL: 166.23
TRIGLYCERIDES: 396 mg/dL — AB (ref 0.0–149.0)
VLDL: 79.2 mg/dL — ABNORMAL HIGH (ref 0.0–40.0)

## 2018-03-30 LAB — HEMOGLOBIN A1C: Hgb A1c MFr Bld: 10.1 % — ABNORMAL HIGH (ref 4.6–6.5)

## 2018-03-30 LAB — TSH: TSH: 2.83 u[IU]/mL (ref 0.35–4.50)

## 2018-03-30 LAB — LDL CHOLESTEROL, DIRECT: Direct LDL: 82 mg/dL

## 2018-03-30 NOTE — Progress Notes (Signed)
Subjective:  I acted as a Education administrator for Dr. Charlett Blake. [place], [place]  Patient ID: [name], male    DOB: Jun 10, 1961, 57 y.o.   MRN: [id number]  No chief complaint on file.   HPI  Patient is in today for a 3 month follow up. And he continues to struggle with pain, diffuse joint pains noted most days. No recent febrile illness or hospitalizations. No polyuria or polydipsia. Is trying to maintain a heart healthy diet. Denies CP/palp/SOB/HA/congestion/fevers/GI or GU c/o. Taking meds as prescribed  Patient Care Team: Mosie Lukes, MD as PCP - General (Family Medicine)   Past Medical History:  Diagnosis Date  . Anemia 10/03/2009   Qualifier: Diagnosis of  By: [name]   . Anxiety associated with depression 10/11/2013  . Colon cancer ([place])    right colon cancer- adenocarcinoma CEA level isnrmal at 1.8  . Diabetes mellitus   . Diabetes mellitus type 2 in obese [facility]) 03/20/2010   Qualifier: Diagnosis of  By: [name]    . Erectile dysfunction 06/22/2016  . Essential hypertension 06/05/2014  . FATTY LIVER DISEASE 11/04/2009   Qualifier: Diagnosis of  By: [name] MD, Docia Chuck   Qualifier: Diagnosis of  By: [name] MD, Docia Chuck  Last Assessment & Plan:  conirmed by CT scan of abdomen in April 2016 encouraged to minimize simple carbs and fatty foods.  Marland Kitchen GERD 11/04/2009   Qualifier: Diagnosis of  By: [name] MD, Docia Chuck   . Great toe pain, right 04/18/2017  . Hepatic artery stenosis ([place])   . History of colon cancer 01/02/2010   Qualifier: Diagnosis of  By: [name] Dr [name]  Last Assessment & Plan:  Follows closely with gastroenterology, Dr [name]  . Hyperlipidemia   . Hyperlipidemia, mixed 09/07/2010   Qualifier: Diagnosis of  By: [name]   . Hypertension   . Hypertriglyceridemia 12/14/2010  . Hypotestosteronism 06/22/2016  . INSOMNIA, CHRONIC 03/20/2010   Qualifier: Diagnosis of  By: [name] Ambien 10 mg daily does not keep asleep AdvilPM is over sedating and  has trouble waking up   . Iron deficiency anemia 2011  . Low testosterone 06/22/2016  . Malignant neoplasm of colon ([place]) 01/02/2010   Qualifier: Diagnosis of  By: [name] Dr [name]   . Muscle spasm of back 06/05/2014  . Nipple pain 09/30/2016  . Obesity   . Palpitations 06/05/2014  . Preventative health care 10/08/2012  . Psoriasis   . Psoriatic arthritis ([place]) 08/25/2017  . Right knee pain 11/17/2011    Past Surgical History:  Procedure Laterality Date  . CARPAL TUNNEL RELEASE     RIGHT  . CHOLECYSTECTOMY  1994  . COLONOSCOPY    . HEMICOLECTOMY     12/17/2009 right  . POLYPECTOMY      Family History  Problem Relation Age of Onset  . Stomach cancer Mother        diedinher 23's  . Diabetes type II Brother        boderline  . Diabetes Brother        type II  . Alcohol abuse Brother   . Other Neg Hx        cad,prostate ca, colon ca  . Coronary artery disease Neg Hx   . Cancer Neg Hx        colon, prostate    Social History   Socioeconomic History  . Marital status: Single  Spouse name: [name]  . Number of children: 0  . Years of education: Not on file  . Highest education level: Not on file  Occupational History  . Occupation: Merchant navy officer    Comment: Recruitment consultant  Social Needs  . Financial resource strain: Not on file  . Food insecurity:    Worry: Not on file    Inability: Not on file  . Transportation needs:    Medical: Not on file    Non-medical: Not on file  Tobacco Use  . Smoking status: Current Some Day Smoker    Types: Cigarettes  . Smokeless tobacco: Never Used  Substance and Sexual Activity  . Alcohol use: No  . Drug use: No  . Sexual activity: Yes    Comment: lives with girlfriend, no dietary restrictions  Lifestyle  . Physical activity:    Days per week: Not on file    Minutes per session: Not on file  . Stress: Not on file  Relationships  . Social connections:    Talks on phone: Not on file    Gets together: Not on file     Attends religious service: Not on file    Active member of club or organization: Not on file    Attends meetings of clubs or organizations: Not on file    Relationship status: Not on file  . Intimate partner violence:    Fear of current or ex partner: Not on file    Emotionally abused: Not on file    Physically abused: Not on file    Forced sexual activity: Not on file  Other Topics Concern  . Not on file  Social History Narrative   Occupation: Theatre stage manager and fax)   Single  (lives with [name])   no children    former smoker   Illicit Drug Use - no     Outpatient Medications Prior to Visit  Medication Sig Dispense Refill  . atorvastatin (LIPITOR) 20 MG tablet Take 1 tablet (20 mg total) by mouth daily. 90 tablet 3  . Blood Glucose Monitoring Suppl (ONETOUCH VERIO) W/DEVICE KIT   0  . busPIRone (BUSPAR) 10 MG tablet TAKE 1/2 TABLET (5 MG TOTAL) BY MOUTH 2 (TWO) TIMES DAILY AS NEEDED FOR ANXIETY 90 tablet 1  . clobetasol cream (TEMOVATE) 0.05 %     . Clobetasol Propionate (CLOBEX SPRAY) 0.05 % external spray     . diazepam (VALIUM) 5 MG tablet Take 1 tablet (5 mg total) by mouth at bedtime as needed for anxiety. 30 tablet 5  . fenofibrate 160 MG tablet Take 1 tablet (160 mg total) by mouth daily. 90 tablet 3  . glimepiride (AMARYL) 1 MG tablet TAKE 1 TABLET BY MOUTH EVERY DAY BEFORE BREAKFAST 90 tablet 2  . glucose blood (ONETOUCH VERIO) test strip Test three times daily, DX E11.9 300 each 6  . insulin aspart (NOVOLOG FLEXPEN) 100 UNIT/ML FlexPen Inject into the skin.     . Insulin Detemir (LEVEMIR FLEXTOUCH) 100 UNIT/ML Pen Inject 60 Units into the skin daily at 10 pm. 45 mL 3  . Insulin Glargine (LANTUS SOLOSTAR) 100 UNIT/ML Solostar Pen Inject into the skin.    . Insulin Pen Needle (B-D ULTRAFINE III SHORT PEN) 31G X 8 MM MISC Test as directed three times daily.  DX E11.9 300 each 3  . lisinopril (PRINIVIL,ZESTRIL) 5 MG tablet TAKE 1 TABLET (5 MG TOTAL) BY MOUTH DAILY. 90  tablet 2  . losartan (COZAAR) 50 MG tablet TAKE  1 TABLET (50 MG TOTAL) BY MOUTH DAILY. Blood Pressure 90 tablet 1  . meloxicam (MOBIC) 15 MG tablet Take 15 mg by mouth daily.     . metFORMIN (GLUCOPHAGE) 1000 MG tablet TAKE 1 TABLET (1,000 MG TOTAL) BY MOUTH 2 (TWO) TIMES DAILY WITH A MEAL. 180 tablet 2  . methylPREDNISolone (MEDROL) 4 MG tablet PLEASE SEE ATTACHED FOR DETAILED DIRECTIONS 78 tablet 0  . metoprolol succinate (TOPROL-XL) 25 MG 24 hr tablet TAKE 1 TABLET (25 MG TOTAL) BY MOUTH DAILY. 90 tablet 1  . NOVOLOG FLEXPEN 100 UNIT/ML FlexPen INJECT 10-16 UNITS UNDER SKIN 3 TIMES A DAY BEFORE MEALS 45 pen 3  . omeprazole (PRILOSEC) 40 MG capsule Take 1 capsule (40 mg total) by mouth 2 (two) times daily. (Patient taking differently: Take 40 mg by mouth 2 (two) times daily. As needed) 180 capsule 1  . [place] LANCETS MISC     . Secukinumab (COSENTYX 300 DOSE) 150 MG/ML SOSY Inject 300 mg into the skin once monthly.    . traZODone (DESYREL) 50 MG tablet Take 50 mg by mouth at bedtime as needed.    . zolpidem (AMBIEN) 10 MG tablet Take 1 tablet (10 mg total) by mouth at bedtime as needed. 30 tablet 3   No facility-administered medications prior to visit.     No Known Allergies  Review of Systems  Constitutional: Negative for fever and malaise/fatigue.  HENT: Negative for congestion.   Eyes: Negative for blurred vision.  Respiratory: Negative for shortness of breath.   Cardiovascular: Negative for chest pain, palpitations and leg swelling.  Gastrointestinal: Negative for abdominal pain, blood in stool and nausea.  Genitourinary: Negative for dysuria and frequency.  Musculoskeletal: Positive for back pain, joint pain and myalgias. Negative for falls.  Skin: Negative for rash.  Neurological: Negative for dizziness, loss of consciousness and headaches.  Endo/Heme/Allergies: Negative for environmental allergies.  Psychiatric/Behavioral: Negative for depression. The patient is not  nervous/anxious.        Objective:    Physical Exam  Constitutional: He is oriented to person, place, and time. He appears well-developed and well-nourished. No distress.  HENT:  Head: Normocephalic and atraumatic.  Nose: Nose normal.  Eyes: Right eye exhibits no discharge. Left eye exhibits no discharge.  Neck: Normal range of motion. Neck supple.  Cardiovascular: Normal rate and regular rhythm.  No murmur heard. Pulmonary/Chest: Effort normal and breath sounds normal.  Abdominal: Soft. Bowel sounds are normal. There is no tenderness.  Musculoskeletal: He exhibits no edema.  Neurological: He is alert and oriented to person, place, and time.  Skin: Skin is warm and dry.  Psychiatric: He has a normal mood and affect.  Nursing note and vitals reviewed.   BP 122/60 (BP Location: Left Arm, Patient Position: Sitting, Cuff Size: Normal)   Pulse 86   Temp 98.2 F (36.8 C) (Oral)   Resp 18   Ht 5' 8" (1.727 m)   Wt 237 lb 12.8 oz (107.9 kg)   SpO2 96%   BMI 36.16 kg/m  Wt Readings from Last 3 Encounters:  03/30/18 237 lb 12.8 oz (107.9 kg)  02/06/18 233 lb (105.7 kg)  12/07/17 230 lb 12.8 oz (104.7 kg)   BP Readings from Last 3 Encounters:  03/30/18 122/60  02/06/18 126/80  12/07/17 108/68     Immunization History  Administered Date(s) Administered  . Influenza Whole 03/20/2010  . Influenza,inj,Quad PF,6+ Mos 05/01/2015, 06/22/2016, 04/18/2017  . Pneumococcal Conjugate-13 04/18/2017  . Tdap 05/01/2015  Health Maintenance  Topic Date Due  . PNEUMOCOCCAL POLYSACCHARIDE VACCINE AGE 60-64 HIGH RISK  11/03/1964  . FOOT EXAM  11/03/1972  . OPHTHALMOLOGY EXAM  11/03/1972  . HIV Screening  11/03/1977  . COLONOSCOPY  06/22/2016  . INFLUENZA VACCINE  02/16/2018  . HEMOGLOBIN A1C  09/28/2018  . TETANUS/TDAP  04/30/2025  . Hepatitis C Screening  Completed    Lab Results  Component Value Date   WBC 10.5 08/02/2017   HGB 13.0 08/02/2017   HCT 37.9 08/02/2017   PLT 404  (H) 08/02/2017   GLUCOSE 117 (H) 08/08/2017   CHOL 207 (H) 03/30/2018   TRIG 396.0 (H) 03/30/2018   HDL 40.80 03/30/2018   LDLDIRECT 82.0 03/30/2018   LDLCALC 31 08/08/2017   ALT 16 08/08/2017   AST 14 08/08/2017   NA 138 08/08/2017   K 3.9 08/08/2017   CL 104 08/08/2017   CREATININE 0.65 08/08/2017   BUN 14 08/08/2017   CO2 25 08/08/2017   TSH 2.83 03/30/2018   PSA 0.27 04/18/2017   INR 0.9 10/27/2014   HGBA1C 10.1 (H) 03/30/2018   MICROALBUR 0.50 01/08/2013    Lab Results  Component Value Date   TSH 2.83 03/30/2018   Lab Results  Component Value Date   WBC 10.5 08/02/2017   HGB 13.0 08/02/2017   HCT 37.9 08/02/2017   MCV 87 08/02/2017   PLT 404 (H) 08/02/2017   Lab Results  Component Value Date   NA 138 08/08/2017   K 3.9 08/08/2017   CO2 25 08/08/2017   GLUCOSE 117 (H) 08/08/2017   BUN 14 08/08/2017   CREATININE 0.65 08/08/2017   BILITOT 0.8 08/08/2017   ALKPHOS 59 08/08/2017   AST 14 08/08/2017   ALT 16 08/08/2017   PROT 7.2 08/08/2017   ALBUMIN 3.7 08/08/2017   CALCIUM 8.9 08/08/2017   ANIONGAP 4 (L) 10/30/2014   GFR 135.67 08/08/2017   Lab Results  Component Value Date   CHOL 207 (H) 03/30/2018   Lab Results  Component Value Date   HDL 40.80 03/30/2018   Lab Results  Component Value Date   LDLCALC 31 08/08/2017   Lab Results  Component Value Date   TRIG 396.0 (H) 03/30/2018   Lab Results  Component Value Date   CHOLHDL 5 03/30/2018   Lab Results  Component Value Date   HGBA1C 10.1 (H) 03/30/2018         Assessment & Plan:   Problem List Items Addressed This Visit    Diabetes mellitus type 2 in obese ([place])    hgba1c acceptable, minimize simple carbs. Increase exercise as tolerated.      Relevant Orders   Hemoglobin A1c (Completed)   Hyperlipidemia, mixed    Tolerating statin, encouraged heart healthy diet, avoid trans fats, minimize simple carbs and saturated fats. Increase exercise as tolerated      Relevant Orders    Lipid panel (Completed)   Essential hypertension    Well controlled, no changes to meds. Encouraged heart healthy diet such as the DASH diet and exercise as tolerated.       Relevant Orders   TSH (Completed)   Psoriatic arthritis ([place])    Is following with rheumatology and has been placed on Prednisone, MTX and Folic Acid.          I am having [name] maintain his ONETOUCH [place] LANCETS, ONETOUCH VERIO, glucose blood, Insulin Pen Needle, lisinopril, metFORMIN, metoprolol succinate, Insulin Glargine, meloxicam, clobetasol cream, NOVOLOG FLEXPEN, Clobetasol Propionate, atorvastatin,  omeprazole, losartan, zolpidem, diazepam, fenofibrate, Secukinumab, glimepiride, Insulin Detemir, NOVOLOG FLEXPEN, traZODone, methylPREDNISolone, and busPIRone.  No orders of the defined types were placed in this encounter.   CMA served as Education administrator during this visit. History, Physical and Plan performed by medical provider. Documentation and orders reviewed and attested to.  Penni Homans, MD

## 2018-03-30 NOTE — Assessment & Plan Note (Signed)
Tolerating statin, encouraged heart healthy diet, avoid trans fats, minimize simple carbs and saturated fats. Increase exercise as tolerated 

## 2018-03-30 NOTE — Assessment & Plan Note (Signed)
Well controlled, no changes to meds. Encouraged heart healthy diet such as the DASH diet and exercise as tolerated.  °

## 2018-03-30 NOTE — Assessment & Plan Note (Signed)
hgba1c acceptable, minimize simple carbs. Increase exercise as tolerated.  

## 2018-03-30 NOTE — Patient Instructions (Signed)

## 2018-03-31 ENCOUNTER — Ambulatory Visit: Payer: BLUE CROSS/BLUE SHIELD | Admitting: Family Medicine

## 2018-04-02 NOTE — Assessment & Plan Note (Signed)
Is following with rheumatology and has been placed on Prednisone, MTX and Folic Acid.

## 2018-04-06 DIAGNOSIS — M9901 Segmental and somatic dysfunction of cervical region: Secondary | ICD-10-CM | POA: Diagnosis not present

## 2018-04-06 DIAGNOSIS — M5033 Other cervical disc degeneration, cervicothoracic region: Secondary | ICD-10-CM | POA: Diagnosis not present

## 2018-04-06 DIAGNOSIS — M9902 Segmental and somatic dysfunction of thoracic region: Secondary | ICD-10-CM | POA: Diagnosis not present

## 2018-04-06 DIAGNOSIS — M62830 Muscle spasm of back: Secondary | ICD-10-CM | POA: Diagnosis not present

## 2018-04-10 DIAGNOSIS — M9901 Segmental and somatic dysfunction of cervical region: Secondary | ICD-10-CM | POA: Diagnosis not present

## 2018-04-10 DIAGNOSIS — M62830 Muscle spasm of back: Secondary | ICD-10-CM | POA: Diagnosis not present

## 2018-04-10 DIAGNOSIS — M5033 Other cervical disc degeneration, cervicothoracic region: Secondary | ICD-10-CM | POA: Diagnosis not present

## 2018-04-10 DIAGNOSIS — M9902 Segmental and somatic dysfunction of thoracic region: Secondary | ICD-10-CM | POA: Diagnosis not present

## 2018-04-12 DIAGNOSIS — M9901 Segmental and somatic dysfunction of cervical region: Secondary | ICD-10-CM | POA: Diagnosis not present

## 2018-04-12 DIAGNOSIS — M5033 Other cervical disc degeneration, cervicothoracic region: Secondary | ICD-10-CM | POA: Diagnosis not present

## 2018-04-12 DIAGNOSIS — M62830 Muscle spasm of back: Secondary | ICD-10-CM | POA: Diagnosis not present

## 2018-04-12 DIAGNOSIS — M9902 Segmental and somatic dysfunction of thoracic region: Secondary | ICD-10-CM | POA: Diagnosis not present

## 2018-04-14 DIAGNOSIS — M9902 Segmental and somatic dysfunction of thoracic region: Secondary | ICD-10-CM | POA: Diagnosis not present

## 2018-04-14 DIAGNOSIS — M5033 Other cervical disc degeneration, cervicothoracic region: Secondary | ICD-10-CM | POA: Diagnosis not present

## 2018-04-14 DIAGNOSIS — M62830 Muscle spasm of back: Secondary | ICD-10-CM | POA: Diagnosis not present

## 2018-04-14 DIAGNOSIS — M9901 Segmental and somatic dysfunction of cervical region: Secondary | ICD-10-CM | POA: Diagnosis not present

## 2018-04-17 DIAGNOSIS — M9901 Segmental and somatic dysfunction of cervical region: Secondary | ICD-10-CM | POA: Diagnosis not present

## 2018-04-17 DIAGNOSIS — M9902 Segmental and somatic dysfunction of thoracic region: Secondary | ICD-10-CM | POA: Diagnosis not present

## 2018-04-17 DIAGNOSIS — M5033 Other cervical disc degeneration, cervicothoracic region: Secondary | ICD-10-CM | POA: Diagnosis not present

## 2018-04-17 DIAGNOSIS — M62830 Muscle spasm of back: Secondary | ICD-10-CM | POA: Diagnosis not present

## 2018-04-19 DIAGNOSIS — M9901 Segmental and somatic dysfunction of cervical region: Secondary | ICD-10-CM | POA: Diagnosis not present

## 2018-04-19 DIAGNOSIS — M5033 Other cervical disc degeneration, cervicothoracic region: Secondary | ICD-10-CM | POA: Diagnosis not present

## 2018-04-19 DIAGNOSIS — M62830 Muscle spasm of back: Secondary | ICD-10-CM | POA: Diagnosis not present

## 2018-04-19 DIAGNOSIS — M9902 Segmental and somatic dysfunction of thoracic region: Secondary | ICD-10-CM | POA: Diagnosis not present

## 2018-04-24 DIAGNOSIS — E1165 Type 2 diabetes mellitus with hyperglycemia: Secondary | ICD-10-CM | POA: Diagnosis not present

## 2018-04-24 DIAGNOSIS — E7800 Pure hypercholesterolemia, unspecified: Secondary | ICD-10-CM | POA: Diagnosis not present

## 2018-04-24 DIAGNOSIS — M62830 Muscle spasm of back: Secondary | ICD-10-CM | POA: Diagnosis not present

## 2018-04-24 DIAGNOSIS — M9901 Segmental and somatic dysfunction of cervical region: Secondary | ICD-10-CM | POA: Diagnosis not present

## 2018-04-24 DIAGNOSIS — I10 Essential (primary) hypertension: Secondary | ICD-10-CM | POA: Diagnosis not present

## 2018-04-24 DIAGNOSIS — H35033 Hypertensive retinopathy, bilateral: Secondary | ICD-10-CM | POA: Diagnosis not present

## 2018-04-24 DIAGNOSIS — M9902 Segmental and somatic dysfunction of thoracic region: Secondary | ICD-10-CM | POA: Diagnosis not present

## 2018-04-24 DIAGNOSIS — M5033 Other cervical disc degeneration, cervicothoracic region: Secondary | ICD-10-CM | POA: Diagnosis not present

## 2018-04-24 LAB — HM DIABETES EYE EXAM

## 2018-04-26 DIAGNOSIS — M5033 Other cervical disc degeneration, cervicothoracic region: Secondary | ICD-10-CM | POA: Diagnosis not present

## 2018-04-26 DIAGNOSIS — M9902 Segmental and somatic dysfunction of thoracic region: Secondary | ICD-10-CM | POA: Diagnosis not present

## 2018-04-26 DIAGNOSIS — M9901 Segmental and somatic dysfunction of cervical region: Secondary | ICD-10-CM | POA: Diagnosis not present

## 2018-04-26 DIAGNOSIS — M62830 Muscle spasm of back: Secondary | ICD-10-CM | POA: Diagnosis not present

## 2018-04-27 ENCOUNTER — Other Ambulatory Visit: Payer: Self-pay | Admitting: Family Medicine

## 2018-04-29 DIAGNOSIS — L089 Local infection of the skin and subcutaneous tissue, unspecified: Secondary | ICD-10-CM | POA: Diagnosis not present

## 2018-04-29 DIAGNOSIS — Z6837 Body mass index (BMI) 37.0-37.9, adult: Secondary | ICD-10-CM | POA: Diagnosis not present

## 2018-04-29 DIAGNOSIS — S50811A Abrasion of right forearm, initial encounter: Secondary | ICD-10-CM | POA: Diagnosis not present

## 2018-05-02 ENCOUNTER — Encounter: Payer: Self-pay | Admitting: Family Medicine

## 2018-05-04 ENCOUNTER — Ambulatory Visit (INDEPENDENT_AMBULATORY_CARE_PROVIDER_SITE_OTHER): Payer: BLUE CROSS/BLUE SHIELD | Admitting: Family Medicine

## 2018-05-04 VITALS — BP 142/72 | HR 91 | Temp 98.1°F | Resp 18 | Wt 247.6 lb

## 2018-05-04 DIAGNOSIS — I10 Essential (primary) hypertension: Secondary | ICD-10-CM

## 2018-05-04 DIAGNOSIS — M79601 Pain in right arm: Secondary | ICD-10-CM

## 2018-05-04 DIAGNOSIS — M7989 Other specified soft tissue disorders: Secondary | ICD-10-CM

## 2018-05-04 DIAGNOSIS — S40921A Unspecified superficial injury of right upper arm, initial encounter: Secondary | ICD-10-CM | POA: Diagnosis not present

## 2018-05-04 DIAGNOSIS — E669 Obesity, unspecified: Secondary | ICD-10-CM

## 2018-05-04 DIAGNOSIS — Z23 Encounter for immunization: Secondary | ICD-10-CM

## 2018-05-04 DIAGNOSIS — E1169 Type 2 diabetes mellitus with other specified complication: Secondary | ICD-10-CM

## 2018-05-04 MED ORDER — HYDROCODONE-ACETAMINOPHEN 5-325 MG PO TABS: 1.0000 | ORAL_TABLET | ORAL | 0 refills | Status: DC | PRN

## 2018-05-04 MED ORDER — MUPIROCIN 2 % EX OINT: 1.0000 "application " | TOPICAL_OINTMENT | Freq: Two times a day (BID) | CUTANEOUS | 0 refills | Status: DC

## 2018-05-04 NOTE — Assessment & Plan Note (Signed)
hgba1c unacceptable, minimize simple carbs. Increase exercise as tolerated. Continue current meds. Was seen at opthamology recently. He reports he has not been checking his sugars is encouraged to do so.

## 2018-05-04 NOTE — Patient Instructions (Signed)
Abrasion An abrasion is a cut or scrape on the outer surface of your skin. An abrasion does not extend through all of the layers of your skin. It is important to care for your abrasion properly to prevent infection. What are the causes? Most abrasions are caused by falling on or gliding across the ground or another surface. When your skin rubs on something, the outer and inner layer of skin rubs off. What are the signs or symptoms? A cut or scrape is the main symptom of this condition. The scrape may be bleeding, or it may appear red or pink. If there was an associated fall, there may be an underlying bruise. How is this diagnosed? An abrasion is diagnosed with a physical exam. How is this treated? Treatment for this condition depends on how large and deep the abrasion is. Usually, your abrasion will be cleaned with water and mild soap. This removes any dirt or debris that may be stuck. An antibiotic ointment may be applied to the abrasion to help prevent infection. A bandage (dressing) may be placed on the abrasion to keep it clean. You may also need a tetanus shot. Follow these instructions at home: Medicines   Take or apply medicines only as directed by your health care provider.  If you were prescribed an antibiotic ointment, finish all of it even if you start to feel better. Wound care   Clean the wound with mild soap and water 2-3 times per day or as directed by your health care provider. Pat your wound dry with a clean towel. Do not rub it.  There are many different ways to close and cover a wound. Follow instructions from your health care provider about:  Wound care.  Dressing changes and removal.  Check your wound every day for signs of infection. Watch for:  Redness, swelling, or pain.  Fluid, blood, or pus. General instructions    Keep the dressing dry as directed by your health care provider. Do not take baths, swim, use a hot tub, or do anything that would put your  wound underwater until your health care provider approves.  If there is swelling, raise (elevate) the injured area above the level of your heart while you are sitting or lying down.  Keep all follow-up visits as directed by your health care provider. This is important. Contact a health care provider if:  You received a tetanus shot and you have swelling, severe pain, redness, or bleeding at the injection site.  Your pain is not controlled with medicine.  You have increased redness, swelling, or pain at the site of your wound. Get help right away if:  You have a red streak going away from your wound.  You have a fever.  You have fluid, blood, or pus coming from your wound.  You notice a bad smell coming from your wound or your dressing. This information is not intended to replace advice given to you by your health care provider. Make sure you discuss any questions you have with your health care provider. Document Released: 04/14/2005 Document Revised: 03/05/2016 Document Reviewed: 07/03/2014 Elsevier Interactive Patient Education  2017 [employer]  

## 2018-05-04 NOTE — Assessment & Plan Note (Signed)
Well controlled, no changes to meds. Encouraged heart healthy diet such as the DASH diet and exercise as tolerated.  °
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
## 2018-05-08 ENCOUNTER — Telehealth: Payer: Self-pay | Admitting: *Deleted

## 2018-05-08 NOTE — Telephone Encounter (Signed)
Received FMLA/STD paperwork from [employer], completed as much as possible; forwarded to provider/SLS 10/21

## 2018-05-09 ENCOUNTER — Telehealth: Payer: Self-pay | Admitting: *Deleted

## 2018-05-09 NOTE — Telephone Encounter (Signed)
Received Medical records from [facility]; forwarded to provider/SLS 10/22

## 2018-05-25 ENCOUNTER — Encounter: Payer: Self-pay | Admitting: Family Medicine

## 2018-06-14 ENCOUNTER — Other Ambulatory Visit: Payer: Self-pay | Admitting: Family Medicine

## 2018-06-19 NOTE — Telephone Encounter (Signed)
Request for Ambien and diazepam.   Last OV: 05/04/2018 Last Fill on Ambien: 11/24/2017 #30 and 3RF Last Fill on diazepam: 11/24/2017 #30 and 5RF UDS: 06/24/2017 Low risk

## 2018-06-28 ENCOUNTER — Ambulatory Visit: Payer: Self-pay | Admitting: *Deleted

## 2018-06-28 DIAGNOSIS — L409 Psoriasis, unspecified: Secondary | ICD-10-CM | POA: Diagnosis not present

## 2018-06-28 DIAGNOSIS — Z85038 Personal history of other malignant neoplasm of large intestine: Secondary | ICD-10-CM | POA: Diagnosis not present

## 2018-06-28 DIAGNOSIS — L4050 Arthropathic psoriasis, unspecified: Secondary | ICD-10-CM | POA: Diagnosis not present

## 2018-06-28 DIAGNOSIS — Z79899 Other long term (current) drug therapy: Secondary | ICD-10-CM | POA: Diagnosis not present

## 2018-06-28 NOTE — Telephone Encounter (Signed)
Message from Carolyn Stare sent at 06/28/2018 4:32 PM EST    Kayla with [facility] call to schedule pt an appt and also to say they did lab work on the pt and it came back that his glucose was 325 and she contacted him and pt did admit he was not taking his medication as he should. I did schedule him to see Mackie Pai on 06/29/18 and she was going to contact him with the appt time and day   Returned call to pt regarding current glucose reading. Pt states he is fine with the appt on Friday and does not want to discuss his blood glucose readings now and then explain again during appt on Friday. Pt currently denies any symptoms. Pt states his glucose reading tonight was 321 and he has taken his diabetic medication today.   Reason for Disposition . [1] Blood glucose > 300 mg/dL (16.7 mmol/L) AND [2] two or more times in a row  Answer Assessment - Initial Assessment Questions 1. BLOOD GLUCOSE: "What is your blood glucose level?"      321 2. ONSET: "When did you check the blood glucose?"     Prior to call from office time not specified 3.. OTHER SYMPTOMS: "Do you have any symptoms?" (e.g., fever, frequent urination, difficulty breathing, dizziness, weakness, vomiting)     Denies any symptoms currently  Unable to complete full assessment pt states he does not want to discuss glucose readings now and then again on Friday.  Protocols used: DIABETES - HIGH BLOOD SUGAR-A-AH

## 2018-06-29 ENCOUNTER — Ambulatory Visit: Payer: BLUE CROSS/BLUE SHIELD | Admitting: Medical

## 2018-06-29 NOTE — Telephone Encounter (Signed)
So just confirm he is going to restart his meds. Consider samples of insulin if we have them.

## 2018-06-29 NOTE — Telephone Encounter (Signed)
Noted  Patient has appt with Dr. Larose Kells on 06/30/18

## 2018-06-30 ENCOUNTER — Encounter: Payer: Self-pay | Admitting: Internal Medicine

## 2018-06-30 ENCOUNTER — Ambulatory Visit (INDEPENDENT_AMBULATORY_CARE_PROVIDER_SITE_OTHER): Payer: BLUE CROSS/BLUE SHIELD | Admitting: Internal Medicine

## 2018-06-30 ENCOUNTER — Ambulatory Visit: Payer: BLUE CROSS/BLUE SHIELD | Admitting: Family Medicine

## 2018-06-30 ENCOUNTER — Other Ambulatory Visit: Payer: Self-pay | Admitting: Family Medicine

## 2018-06-30 VITALS — BP 126/70 | HR 96 | Temp 97.6°F | Resp 16 | Ht 68.0 in | Wt 241.1 lb

## 2018-06-30 DIAGNOSIS — E1169 Type 2 diabetes mellitus with other specified complication: Secondary | ICD-10-CM

## 2018-06-30 DIAGNOSIS — E669 Obesity, unspecified: Secondary | ICD-10-CM | POA: Diagnosis not present

## 2018-06-30 NOTE — Progress Notes (Signed)
 Subjective:    Patient ID: [name], male    DOB: 11/29/1960, 57 y.o.   MRN: [id number]  DOS:  06/30/2018 Type of visit - description : Acute visit Patient of Dr. Blyth, here at the request of his rheumatologist due to elevated blood sugar. He went 2 days ago, blood sugar was 322, sodium 132, hemoglobin 14.5, kidney function normal. Reports poor diet and no compliance with diabetes medications for months. He restarted the medications a couple of days ago and started to check CBGs.  They have ranged from 365-92.  When the CBG was 92 he felt poorly. This morning, CBG was 156.  Review of Systems  Denies dizziness, headaches, nausea, vomiting.  Past Medical History:  Diagnosis Date  . Anemia 10/03/2009   Qualifier: Diagnosis of  By: Yoo DO, D. Robert   . Anxiety associated with depression 10/11/2013  . Colon cancer ([facility])    right colon cancer- adenocarcinoma CEA level isnrmal at 1.8  . Diabetes mellitus   . Diabetes mellitus type 2 in obese ([facility]) 03/20/2010   Qualifier: Diagnosis of  By: Yoo DO, D. Robert    . Erectile dysfunction 06/22/2016  . Essential hypertension 06/05/2014  . FATTY LIVER DISEASE 11/04/2009   Qualifier: Diagnosis of  By: Perry MD, John N   Qualifier: Diagnosis of  By: Perry MD, John N  Last Assessment & Plan:  conirmed by CT scan of abdomen in April 2016 encouraged to minimize simple carbs and fatty foods.  . GERD 11/04/2009   Qualifier: Diagnosis of  By: Perry MD, John N   . Great toe pain, right 04/18/2017  . Hepatic artery stenosis ([facility])   . History of colon cancer 01/02/2010   Qualifier: Diagnosis of  By: Yoo DO, D. Robert Dr Perry  Last Assessment & Plan:  Follows closely with gastroenterology, Dr Perry  . Hyperlipidemia   . Hyperlipidemia, mixed 09/07/2010   Qualifier: Diagnosis of  By: Yoo DO, D. Robert   . Hypertension   . Hypertriglyceridemia 12/14/2010  . Hypotestosteronism 06/22/2016  . INSOMNIA, CHRONIC 03/20/2010   Qualifier: Diagnosis of  By: Yoo DO,  D. Robert Ambien 10 mg daily does not keep asleep AdvilPM is over sedating and has trouble waking up   . Iron deficiency anemia 2011  . Low testosterone 06/22/2016  . Malignant neoplasm of colon ([facility]) 01/02/2010   Qualifier: Diagnosis of  By: Yoo DO, D. Robert Dr Perry   . Muscle spasm of back 06/05/2014  . Nipple pain 09/30/2016  . Obesity   . Palpitations 06/05/2014  . Preventative health care 10/08/2012  . Psoriasis   . Psoriatic arthritis ([facility]) 08/25/2017  . Right knee pain 11/17/2011    Past Surgical History:  Procedure Laterality Date  . CARPAL TUNNEL RELEASE     RIGHT  . CHOLECYSTECTOMY  1994  . COLONOSCOPY    . HEMICOLECTOMY     12/17/2009 right  . POLYPECTOMY      Social History   Socioeconomic History  . Marital status: Single    Spouse name: Toni  . Number of children: 0  . Years of education: Not on file  . Highest education level: Not on file  Occupational History  . Occupation: Technician    Comment: printers and fax  Social Needs  . Financial resource strain: Not on file  . Food insecurity:    Worry: Not on file    Inability: Not on file  . Transportation needs:    Medical: Not on   file    Non-medical: Not on file  Tobacco Use  . Smoking status: Current Some Day Smoker    Types: Cigarettes  . Smokeless tobacco: Never Used  Substance and Sexual Activity  . Alcohol use: No  . Drug use: No  . Sexual activity: Yes    Comment: lives with girlfriend, no dietary restrictions  Lifestyle  . Physical activity:    Days per week: Not on file    Minutes per session: Not on file  . Stress: Not on file  Relationships  . Social connections:    Talks on phone: Not on file    Gets together: Not on file    Attends religious service: Not on file    Active member of club or organization: Not on file    Attends meetings of clubs or organizations: Not on file    Relationship status: Not on file  . Intimate partner violence:    Fear of current or ex partner: Not on  file    Emotionally abused: Not on file    Physically abused: Not on file    Forced sexual activity: Not on file  Other Topics Concern  . Not on file  Social History Narrative   Occupation: Technician (printers and fax)   Single  (lives with Toni)   no children    former smoker   Illicit Drug Use - no       Allergies as of 06/30/2018   No Known Allergies     Medication List       Accurate as of June 30, 2018  5:34 PM. Always use your most recent med list.        atorvastatin 20 MG tablet Commonly known as:  LIPITOR Take 1 tablet (20 mg total) by mouth daily.   busPIRone 10 MG tablet Commonly known as:  BUSPAR TAKE 1/2 TABLET (5 MG TOTAL) BY MOUTH 2 (TWO) TIMES DAILY AS NEEDED FOR ANXIETY   CLOBEX SPRAY 0.05 % external spray Generic drug:  Clobetasol Propionate   clobetasol cream 0.05 % Commonly known as:  TEMOVATE   COSENTYX (300 MG DOSE) 150 MG/ML Sos[place] Generic drug:  Secukinumab Inject 300 mg into the skin once monthly.   diazepam 5 MG tablet Commonly known as:  VALIUM TAKE 1 TABLET (5 MG TOTAL) BY MOUTH AT BEDTIME AS NEEDED FOR ANXIETY.   fenofibrate 160 MG tablet Take 1 tablet (160 mg total) by mouth daily.   glimepiride 1 MG tablet Commonly known as:  AMARYL TAKE 1 TABLET BY MOUTH EVERY DAY BEFORE BREAKFAST   glucose blood test strip Commonly known as:  ONETOUCH VERIO Test three times daily, DX E11.9   HYDROcodone-acetaminophen 5-325 MG tablet Commonly known as:  NORCO Take 1 tablet by mouth every 4 (four) hours as needed for moderate pain or severe pain.   Insulin Detemir 100 UNIT/ML Pen Commonly known as:  LEVEMIR FLEXTOUCH Inject 60 Units into the skin daily at 10 pm.   Insulin Pen Needle 31G X 8 MM Misc Commonly known as:  B-D ULTRAFINE III SHORT PEN Test as directed three times daily.  DX E11.9   lisinopril 5 MG tablet Commonly known as:  PRINIVIL,ZESTRIL TAKE 1 TABLET (5 MG TOTAL) BY MOUTH DAILY.   losartan 50 MG  tablet Commonly known as:  COZAAR TAKE 1 TABLET BY MOUTH EVERY DAY   meloxicam 15 MG tablet Commonly known as:  MOBIC Take 15 mg by mouth daily.   metFORMIN 1000 MG tablet Commonly   known as:  GLUCOPHAGE TAKE 1 TABLET (1,000 MG TOTAL) BY MOUTH 2 (TWO) TIMES DAILY WITH A MEAL.   methylPREDNISolone 4 MG tablet Commonly known as:  MEDROL PLEASE SEE ATTACHED FOR DETAILED DIRECTIONS   metoprolol succinate 25 MG 24 hr tablet Commonly known as:  TOPROL-XL TAKE 1 TABLET (25 MG TOTAL) BY MOUTH DAILY.   mupirocin ointment 2 % Commonly known as:  BACTROBAN Place 1 application into the nose 2 (two) times daily.   NOVOLOG FLEXPEN 100 UNIT/ML FlexPen Generic drug:  insulin aspart Inject 5 Units into the skin 3 (three) times daily with meals.   omeprazole 40 MG capsule Commonly known as:  PRILOSEC Take 1 capsule (40 mg total) by mouth 2 (two) times daily.   ONETOUCH DELICA LANCETS Misc   ONETOUCH VERIO w/Device Kit   traZODone 50 MG tablet Commonly known as:  DESYREL Take 50 mg by mouth at bedtime as needed.   zolpidem 10 MG tablet Commonly known as:  AMBIEN TAKE 1 TABLET (10 MG TOTAL) BY MOUTH AT BEDTIME AS NEEDED.           Objective:   Physical Exam BP 126/70 (BP Location: Left Arm, Patient Position: Sitting, Cuff Size: Normal)   Pulse 96   Temp 97.6 F (36.4 C) (Oral)   Resp 16   Ht 5' 8" (1.727 m)   Wt 241 lb 2 oz (109.4 kg)   SpO2 97%   BMI 36.66 kg/m  General:   Well developed, NAD, BMI noted. HEENT:  Normocephalic . Face symmetric, atraumatic Skin: Not pale. Not jaundice Neurologic:  alert & oriented X3.  Speech normal, gait appropriate for age and unassisted Psych--  Cognition and judgment appear intact.  Cooperative with normal attention span and concentration.  Behavior appropriate. No anxious or depressed appearing.      Assessment & Plan:    57 year old gentleman, PMH includes DM, hyperlipidemia, HTN, history of negative for stress  echocardiogram 12/2017.  Did havea hypertensive response. Here for diabetes management. On prednisone prn  DM: Poorly controlled but asymptomatic hyperglycemia. Current recommended medications, has not taken them in ny months, just restarted 2 days ago.: glimepiride 1 mg daily Levemir 60 units at night Metformin 1000 mg twice a day NovoLog 10 to 16 units 3 times daily. Plan: Stop glimepiride Continue Levemir Continue metformin Change NovoLog to 5 units 3 times daily. Will avoid tight control for now. CBGs at least 2-3 times a day. Call Dr. PCP with readings in 10 days, appointment in 6 weeks. Long-term complications of diabetes discussed.  Today, I spent more than  25  min with the patient: >50% of the time counseling regards complications of poorly control DM, reviewing the chart , discussing plan of care in detail, multiple questions answer

## 2018-06-30 NOTE — Patient Instructions (Signed)
Make an appointment to see Dr. Charlett Blake  in 6 weeks  STOP glimepiride  Continue Levemir 60 units daily Continue metformin as prescribed Change NovoLog to only 5 units with meals.  Diabetes: Check your blood sugar  2-3 times a day  at different times of the day Keep a log, call Dr. Charlett Blake in 10 days and let her know.  GOALS: In the morning: 100-130. Fasting before a meal 100 -130 2 hours after a meal less than 180 At bedtime 90-150

## 2018-06-30 NOTE — Progress Notes (Signed)
Pre visit review using our clinic review tool, if applicable. No additional management support is needed unless otherwise documented below in the visit note. 

## 2018-07-03 NOTE — Telephone Encounter (Signed)
Spoke with patient on Friday he sated he will re start his medication

## 2018-07-28 ENCOUNTER — Other Ambulatory Visit: Payer: Self-pay | Admitting: Family Medicine

## 2018-08-15 ENCOUNTER — Telehealth: Payer: Self-pay | Admitting: *Deleted

## 2018-08-15 NOTE — Telephone Encounter (Signed)
Received Diabetic Eye Exam Report from [facility]; forwarded to provider/SLS 01/28

## 2018-08-18 ENCOUNTER — Ambulatory Visit: Payer: BLUE CROSS/BLUE SHIELD | Admitting: Family Medicine

## 2018-08-20 ENCOUNTER — Other Ambulatory Visit: Payer: Self-pay | Admitting: Family Medicine

## 2018-08-25 ENCOUNTER — Encounter: Payer: Self-pay | Admitting: Family Medicine

## 2018-08-25 ENCOUNTER — Ambulatory Visit (INDEPENDENT_AMBULATORY_CARE_PROVIDER_SITE_OTHER): Payer: BLUE CROSS/BLUE SHIELD | Admitting: Family Medicine

## 2018-08-25 VITALS — BP 135/67 | HR 90 | Temp 98.3°F | Ht 68.0 in | Wt 237.0 lb

## 2018-08-25 DIAGNOSIS — I10 Essential (primary) hypertension: Secondary | ICD-10-CM

## 2018-08-25 DIAGNOSIS — F32A Depression, unspecified: Secondary | ICD-10-CM

## 2018-08-25 DIAGNOSIS — K589 Irritable bowel syndrome without diarrhea: Secondary | ICD-10-CM

## 2018-08-25 DIAGNOSIS — E669 Obesity, unspecified: Secondary | ICD-10-CM | POA: Diagnosis not present

## 2018-08-25 DIAGNOSIS — E1169 Type 2 diabetes mellitus with other specified complication: Secondary | ICD-10-CM

## 2018-08-25 DIAGNOSIS — E349 Endocrine disorder, unspecified: Secondary | ICD-10-CM

## 2018-08-25 DIAGNOSIS — E119 Type 2 diabetes mellitus without complications: Secondary | ICD-10-CM

## 2018-08-25 DIAGNOSIS — E782 Mixed hyperlipidemia: Secondary | ICD-10-CM

## 2018-08-25 DIAGNOSIS — F329 Major depressive disorder, single episode, unspecified: Secondary | ICD-10-CM

## 2018-08-25 LAB — LIPID PANEL
Cholesterol: 142 mg/dL (ref 0–200)
HDL: 28.2 mg/dL — ABNORMAL LOW (ref >39.00)
NonHDL: 113.41
Total CHOL/HDL Ratio: 5
Triglycerides: 238 mg/dL — ABNORMAL HIGH (ref 0.0–149.0)
VLDL: 47.6 mg/dL — AB (ref 0.0–40.0)

## 2018-08-25 LAB — CBC WITH DIFFERENTIAL/PLATELET
Basophils Absolute: 0.1 10*3/uL (ref 0.0–0.1)
Basophils Relative: 0.9 % (ref 0.0–3.0)
EOS PCT: 2.1 % (ref 0.0–5.0)
Eosinophils Absolute: 0.2 10*3/uL (ref 0.0–0.7)
HCT: 41.8 % (ref 39.0–52.0)
Hemoglobin: 14.4 g/dL (ref 13.0–17.0)
Lymphocytes Relative: 25 % (ref 12.0–46.0)
Lymphs Abs: 2.7 10*3/uL (ref 0.7–4.0)
MCHC: 34.4 g/dL (ref 30.0–36.0)
MCV: 93.9 fl (ref 78.0–100.0)
MONO ABS: 0.9 10*3/uL (ref 0.1–1.0)
Monocytes Relative: 8.2 % (ref 3.0–12.0)
Neutro Abs: 6.8 10*3/uL (ref 1.4–7.7)
Neutrophils Relative %: 63.8 % (ref 43.0–77.0)
Platelets: 287 10*3/uL (ref 150.0–400.0)
RBC: 4.45 Mil/uL (ref 4.22–5.81)
RDW: 13.4 % (ref 11.5–15.5)
WBC: 10.7 10*3/uL — ABNORMAL HIGH (ref 4.0–10.5)

## 2018-08-25 LAB — COMPREHENSIVE METABOLIC PANEL
ALT: 27 U/L (ref 0–53)
AST: 16 U/L (ref 0–37)
Albumin: 4.1 g/dL (ref 3.5–5.2)
Alkaline Phosphatase: 55 U/L (ref 39–117)
BUN: 13 mg/dL (ref 6–23)
CO2: 24 mEq/L (ref 19–32)
Calcium: 9 mg/dL (ref 8.4–10.5)
Chloride: 106 mEq/L (ref 96–112)
Creatinine, Ser: 0.79 mg/dL (ref 0.40–1.50)
GFR: 101.53 mL/min (ref >60.00)
Glucose, Bld: 147 mg/dL — ABNORMAL HIGH (ref 70–99)
Potassium: 4.2 mEq/L (ref 3.5–5.1)
Sodium: 139 mEq/L (ref 135–145)
Total Bilirubin: 0.6 mg/dL (ref 0.2–1.2)
Total Protein: 6.6 g/dL (ref 6.0–8.3)

## 2018-08-25 LAB — TSH: TSH: 2.77 u[IU]/mL (ref 0.35–4.50)

## 2018-08-25 LAB — LDL CHOLESTEROL, DIRECT: Direct LDL: 72 mg/dL

## 2018-08-25 LAB — TESTOSTERONE: TESTOSTERONE: 188.03 ng/dL — AB (ref 300.00–890.00)

## 2018-08-25 LAB — HEMOGLOBIN A1C: HEMOGLOBIN A1C: 8.1 % — AB (ref 4.6–6.5)

## 2018-08-25 NOTE — Assessment & Plan Note (Addendum)
hgba1c unacceptable, minimize simple carbs. Increase exercise as tolerated. He has been trying to eat better and his sugars have been improving.

## 2018-08-25 NOTE — Patient Instructions (Signed)
Can take of Benefiber up to 3 x day and can use Imodium up to twice daily to control diarrhea Diarrhea, Adult Diarrhea is frequent loose and watery bowel movements. Diarrhea can make you feel weak and cause you to become dehydrated. Dehydration can make you tired and thirsty, cause you to have a dry mouth, and decrease how often you urinate. Diarrhea typically lasts 2-3 days. However, it can last longer if it is a sign of something more serious. It is important to treat your diarrhea as told by your health care provider. Follow these instructions at home: Eating and drinking     Follow these recommendations as told by your health care provider:  Take an oral rehydration solution (ORS). This is an over-the-counter medicine that helps return your body to its normal balance of nutrients and water. It is found at pharmacies and retail stores.  Drink plenty of fluids, such as water, ice chips, diluted fruit juice, and low-calorie sports drinks. You can drink milk also, if desired.  Avoid drinking fluids that contain a lot of sugar or caffeine, such as energy drinks, sports drinks, and soda.  Eat bland, easy-to-digest foods in small amounts as you are able. These foods include bananas, applesauce, rice, lean meats, toast, and crackers.  Avoid alcohol.  Avoid spicy or fatty foods.  Medicines  Take over-the-counter and prescription medicines only as told by your health care provider.  If you were prescribed an antibiotic medicine, take it as told by your health care provider. Do not stop using the antibiotic even if you start to feel better. General instructions   Wash your hands often using soap and water. If soap and water are not available, use a hand sanitizer. Others in the household should wash their hands as well. Hands should be washed: ? After using the toilet or changing a diaper. ? Before preparing, cooking, or serving food. ? While caring for a sick person or while visiting  someone in a hospital.  Drink enough fluid to keep your urine pale yellow.  Rest at home while you recover.  Watch your condition for any changes.  Take a warm bath to relieve any burning or pain from frequent diarrhea episodes.  Keep all follow-up visits as told by your health care provider. This is important. Contact a health care provider if:  You have a fever.  Your diarrhea gets worse.  You have new symptoms.  You cannot keep fluids down.  You feel light-headed or dizzy.  You have a headache.  You have muscle cramps. Get help right away if:  You have chest pain.  You feel extremely weak or you faint.  You have bloody or black stools or stools that look like tar.  You have severe pain, cramping, or bloating in your abdomen.  You have trouble breathing or you are breathing very quickly.  Your heart is beating very quickly.  Your skin feels cold and clammy.  You feel confused.  You have signs of dehydration, such as: ? Dark urine, very little urine, or no urine. ? Cracked lips. ? Dry mouth. ? Sunken eyes. ? Sleepiness. ? Weakness. Summary  Diarrhea is frequent loose and watery bowel movements. Diarrhea can make you feel weak and cause you to become dehydrated.  Drink enough fluids to keep your urine pale yellow.  Make sure that you wash your hands after using the toilet. If soap and water are not available, use hand sanitizer.  Contact a health care provider if your  diarrhea gets worse or you have new symptoms.  Get help right away if you have signs of dehydration. This information is not intended to replace advice given to you by your health care provider. Make sure you discuss any questions you have with your health care provider. Document Released: 06/25/2002 Document Revised: 12/09/2017 Document Reviewed: 12/09/2017 Elsevier Interactive Patient Education  2019 [employer].

## 2018-08-25 NOTE — Assessment & Plan Note (Signed)
Encouraged DASH diet, decrease po intake and increase exercise as tolerated. Needs 7-8 hours of sleep nightly. Avoid trans fats, eat small, frequent meals every 4-5 hours with lean proteins, complex carbs and healthy fats. Minimize simple carbs 

## 2018-08-25 NOTE — Assessment & Plan Note (Signed)
He is very sad today. He is set to be laid off on 2/27 and is interviewing for a job next week but he is very depressed about this. He is tearful and he is trying to get through but feels very down. He had stopped his Lexapro so he agrees to restart it

## 2018-08-25 NOTE — Assessment & Plan Note (Signed)
Encouraged heart healthy diet, increase exercise, avoid trans fats, consider a krill oil cap daily 

## 2018-08-25 NOTE — Assessment & Plan Note (Addendum)
Has been struggling with multiple watery stool daily since he had bad news. Add benefiber and can use Imodium bid

## 2018-08-25 NOTE — Assessment & Plan Note (Signed)
Well controlled, no changes to meds. Encouraged heart healthy diet such as the DASH diet and exercise as tolerated.  °

## 2018-08-25 NOTE — Assessment & Plan Note (Signed)
Check level 

## 2018-08-25 NOTE — Progress Notes (Signed)
Subjective:    Patient ID: [name], male    DOB: 10-02-1960, 58 y.o.   MRN: [id number]  Chief Complaint  Patient presents with  . Follow-up    HPI Patient is in today for follow up and he is very sad today. He is being laid off on 09/14/2018. 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 He acknowledges anhedonia but he does not endorse suicidal ideation. Since the stress he is having watery stool multiple times a day. No bloody or tarry stool. No fevers or chills or significant abdominal pain. No polyuria or polydipsia. Sugars are doing better since mid December since he cleaned up his diet. He has been alternating Diazepam and Ambien on different nights to let him sleep. He had stopped his Lexapro also. Denies CP/palp/SOB/HA/congestion/fevers or GU c/o. Taking meds as prescribed.   Past Medical History:  Diagnosis Date  . Anemia 10/03/2009   Qualifier: Diagnosis of  By: [name]   . Anxiety associated with depression 10/11/2013  . Colon cancer ([place])    right colon cancer- adenocarcinoma CEA level isnrmal at 1.8  . Diabetes mellitus   . Diabetes mellitus type 2 in obese [facility]) 03/20/2010   Qualifier: Diagnosis of  By: [name]    . Erectile dysfunction 06/22/2016  . Essential hypertension 06/05/2014  . FATTY LIVER DISEASE 11/04/2009   Qualifier: Diagnosis of  By: [name] MD, Docia Chuck   Qualifier: Diagnosis of  By: [name] MD, Docia Chuck  Last Assessment & Plan:  conirmed by CT scan of abdomen in April 2016 encouraged to minimize simple carbs and fatty foods.  Marland Kitchen GERD 11/04/2009   Qualifier: Diagnosis of  By: [name] MD, Docia Chuck   . Great toe pain, right 04/18/2017  . Hepatic artery stenosis ([place])   . History of colon cancer 01/02/2010   Qualifier: Diagnosis of  By: [name] Dr [name]  Last Assessment & Plan:  Follows closely with gastroenterology, Dr [name]  . Hyperlipidemia   . Hyperlipidemia, mixed 09/07/2010   Qualifier: Diagnosis of  By: [name]   . Hypertension   . Hypertriglyceridemia 12/14/2010  . Hypotestosteronism 06/22/2016  . INSOMNIA, CHRONIC 03/20/2010   Qualifier: Diagnosis of  By: [name] Ambien 10 mg daily does not keep asleep AdvilPM is over sedating and has trouble waking up   . Iron deficiency anemia 2011  . Low testosterone 06/22/2016  . Malignant neoplasm of colon ([place]) 01/02/2010   Qualifier: Diagnosis of  By: [name] Dr [name]   . Muscle spasm of back 06/05/2014  . Nipple pain 09/30/2016  . Obesity   . Palpitations 06/05/2014  . Preventative health care 10/08/2012  . Psoriasis   . Psoriatic arthritis ([place]) 08/25/2017  . Right knee pain 11/17/2011    Past Surgical History:  Procedure Laterality Date  . CARPAL TUNNEL RELEASE     RIGHT  . CHOLECYSTECTOMY  1994  . COLONOSCOPY    . HEMICOLECTOMY     12/17/2009 right  . POLYPECTOMY      Family History  Problem Relation Age of Onset  . Stomach cancer Mother        diedinher 42's  . Diabetes type II Brother        boderline  . Diabetes Brother        type II  . Alcohol abuse Brother   . Other Neg  Hx        cad,prostate ca, colon ca  . Coronary artery disease Neg Hx   . Cancer Neg Hx        colon, prostate    Social History   Socioeconomic History  . Marital status: Single    Spouse name: [name]  . Number of children: 0  . Years of education: Not on file  . Highest education level: Not on file  Occupational History  . Occupation: Merchant navy officer    Comment: Recruitment consultant  Social Needs  . Financial resource strain: Not on file  . Food insecurity:    Worry: Not on file    Inability: Not on file  . Transportation needs:    Medical: Not on file    Non-medical: Not on file  Tobacco Use  . Smoking status: Current Some Day Smoker    Types: Cigarettes  . Smokeless tobacco: Never Used  Substance and Sexual Activity  . Alcohol use: No  . Drug use: No  . Sexual activity: Yes    Comment:  lives with girlfriend, no dietary restrictions  Lifestyle  . Physical activity:    Days per week: Not on file    Minutes per session: Not on file  . Stress: Not on file  Relationships  . Social connections:    Talks on phone: Not on file    Gets together: Not on file    Attends religious service: Not on file    Active member of club or organization: Not on file    Attends meetings of clubs or organizations: Not on file    Relationship status: Not on file  . Intimate partner violence:    Fear of current or ex partner: Not on file    Emotionally abused: Not on file    Physically abused: Not on file    Forced sexual activity: Not on file  Other Topics Concern  . Not on file  Social History Narrative   Occupation: Theatre stage manager and fax)   Single  (lives with [name])   no children    former smoker   Illicit Drug Use - no     Outpatient Medications Prior to Visit  Medication Sig Dispense Refill  . atorvastatin (LIPITOR) 20 MG tablet Take 1 tablet (20 mg total) by mouth daily. 90 tablet 3  . Blood Glucose Monitoring Suppl (ONETOUCH VERIO) W/DEVICE KIT   0  . clobetasol cream (TEMOVATE) 0.05 %     . Clobetasol Propionate (CLOBEX SPRAY) 0.05 % external spray     . diazepam (VALIUM) 5 MG tablet TAKE 1 TABLET (5 MG TOTAL) BY MOUTH AT BEDTIME AS NEEDED FOR ANXIETY. 30 tablet 1  . escitalopram (LEXAPRO) 10 MG tablet TAKE 1 TABLET BY MOUTH EVERY DAY 90 tablet 1  . fenofibrate 160 MG tablet Take 1 tablet (160 mg total) by mouth daily. 90 tablet 3  . glimepiride (AMARYL) 1 MG tablet TAKE 1 TABLET BY MOUTH EVERY DAY BEFORE BREAKFAST 90 tablet 2  . glucose blood (ONETOUCH VERIO) test strip Test three times daily, DX E11.9 300 each 6  . HYDROcodone-acetaminophen (NORCO) 5-325 MG tablet Take 1 tablet by mouth every 4 (four) hours as needed for moderate pain or severe pain. 30 tablet 0  . insulin aspart (NOVOLOG FLEXPEN) 100 UNIT/ML FlexPen Inject 5 Units into the skin 3 (three) times daily  with meals.    . Insulin Detemir (LEVEMIR FLEXTOUCH) 100 UNIT/ML Pen Inject 60 Units into the skin daily  at 10 pm. 45 mL 3  . Insulin Pen Needle (B-D ULTRAFINE III SHORT PEN) 31G X 8 MM MISC Test as directed three times daily.  DX E11.9 300 each 3  . lisinopril (PRINIVIL,ZESTRIL) 5 MG tablet TAKE 1 TABLET (5 MG TOTAL) BY MOUTH DAILY. 90 tablet 2  . losartan (COZAAR) 50 MG tablet TAKE 1 TABLET BY MOUTH EVERY DAY 90 tablet 1  . meloxicam (MOBIC) 15 MG tablet Take 15 mg by mouth daily.     . metFORMIN (GLUCOPHAGE) 1000 MG tablet TAKE 1 TABLET (1,000 MG TOTAL) BY MOUTH 2 (TWO) TIMES DAILY WITH A MEAL. 180 tablet 2  . methylPREDNISolone (MEDROL) 4 MG tablet PLEASE SEE ATTACHED FOR DETAILED DIRECTIONS 78 tablet 0  . metoprolol succinate (TOPROL-XL) 25 MG 24 hr tablet TAKE 1 TABLET (25 MG TOTAL) BY MOUTH DAILY. 90 tablet 1  . mupirocin ointment (BACTROBAN) 2 % Place 1 application into the nose 2 (two) times daily. 22 g 0  . omeprazole (PRILOSEC) 40 MG capsule Take 1 capsule (40 mg total) by mouth 2 (two) times daily as needed. 180 capsule 1  . [place] LANCETS MISC     . Secukinumab (COSENTYX 300 DOSE) 150 MG/ML SOSY Inject 300 mg into the skin once monthly.    . zolpidem (AMBIEN) 10 MG tablet TAKE 1 TABLET (10 MG TOTAL) BY MOUTH AT BEDTIME AS NEEDED. 30 tablet 1  . busPIRone (BUSPAR) 10 MG tablet TAKE 1/2 TABLET (5 MG TOTAL) BY MOUTH 2 (TWO) TIMES DAILY AS NEEDED FOR ANXIETY 90 tablet 1  . traZODone (DESYREL) 50 MG tablet Take 50 mg by mouth at bedtime as needed.     No facility-administered medications prior to visit.     No Known Allergies  Review of Systems  Constitutional: Negative for fever and malaise/fatigue.  HENT: Negative for congestion.   Eyes: Negative for blurred vision.  Respiratory: Negative for shortness of breath.   Cardiovascular: Negative for chest pain, palpitations and leg swelling.  Gastrointestinal: Positive for diarrhea. Negative for abdominal pain, blood in stool  and nausea.  Genitourinary: Negative for dysuria and frequency.  Musculoskeletal: Negative for falls.  Skin: Negative for rash.  Neurological: Negative for dizziness, loss of consciousness and headaches.  Endo/Heme/Allergies: Negative for environmental allergies.  Psychiatric/Behavioral: Positive for depression. The patient is nervous/anxious.        Objective:    Physical Exam Vitals signs and nursing note reviewed.  Constitutional:      General: He is not in acute distress.    Appearance: He is well-developed.  HENT:     Head: Normocephalic and atraumatic.     Nose: Nose normal.  Eyes:     General:        Right eye: No discharge.        Left eye: No discharge.  Neck:     Musculoskeletal: Normal range of motion and neck supple.  Cardiovascular:     Rate and Rhythm: Normal rate and regular rhythm.     Heart sounds: No murmur.  Pulmonary:     Effort: Pulmonary effort is normal.     Breath sounds: Normal breath sounds.  Abdominal:     General: Bowel sounds are normal.     Palpations: Abdomen is soft.     Tenderness: There is no abdominal tenderness.  Skin:    General: Skin is warm and dry.  Neurological:     Mental Status: He is alert and oriented to person, place, and time.  BP 135/67   Pulse 90   Temp 98.3 F (36.8 C) (Oral)   Ht '5\' 8"'  (1.727 m)   Wt 237 lb (107.5 kg)   BMI 36.04 kg/m  Wt Readings from Last 3 Encounters:  08/25/18 237 lb (107.5 kg)  06/30/18 241 lb 2 oz (109.4 kg)  05/04/18 247 lb 9.6 oz (112.3 kg)     Lab Results  Component Value Date   WBC 10.5 08/02/2017   HGB 13.0 08/02/2017   HCT 37.9 08/02/2017   PLT 404 (H) 08/02/2017   GLUCOSE 117 (H) 08/08/2017   CHOL 207 (H) 03/30/2018   TRIG 396.0 (H) 03/30/2018   HDL 40.80 03/30/2018   LDLDIRECT 82.0 03/30/2018   LDLCALC 31 08/08/2017   ALT 16 08/08/2017   AST 14 08/08/2017   NA 138 08/08/2017   K 3.9 08/08/2017   CL 104 08/08/2017   CREATININE 0.65 08/08/2017   BUN 14  08/08/2017   CO2 25 08/08/2017   TSH 2.83 03/30/2018   PSA 0.27 04/18/2017   INR 0.9 10/27/2014   HGBA1C 10.1 (H) 03/30/2018   MICROALBUR 0.50 01/08/2013    Lab Results  Component Value Date   TSH 2.83 03/30/2018   Lab Results  Component Value Date   WBC 10.5 08/02/2017   HGB 13.0 08/02/2017   HCT 37.9 08/02/2017   MCV 87 08/02/2017   PLT 404 (H) 08/02/2017   Lab Results  Component Value Date   NA 138 08/08/2017   K 3.9 08/08/2017   CO2 25 08/08/2017   GLUCOSE 117 (H) 08/08/2017   BUN 14 08/08/2017   CREATININE 0.65 08/08/2017   BILITOT 0.8 08/08/2017   ALKPHOS 59 08/08/2017   AST 14 08/08/2017   ALT 16 08/08/2017   PROT 7.2 08/08/2017   ALBUMIN 3.7 08/08/2017   CALCIUM 8.9 08/08/2017   ANIONGAP 4 (L) 10/30/2014   GFR 135.67 08/08/2017   Lab Results  Component Value Date   CHOL 207 (H) 03/30/2018   Lab Results  Component Value Date   HDL 40.80 03/30/2018   Lab Results  Component Value Date   LDLCALC 31 08/08/2017   Lab Results  Component Value Date   TRIG 396.0 (H) 03/30/2018   Lab Results  Component Value Date   CHOLHDL 5 03/30/2018   Lab Results  Component Value Date   HGBA1C 10.1 (H) 03/30/2018       Assessment & Plan:   Problem List Items Addressed This Visit    Obesity    Encouraged DASH diet, decrease po intake and increase exercise as tolerated. Needs 7-8 hours of sleep nightly. Avoid trans fats, eat small, frequent meals every 4-5 hours with lean proteins, complex carbs and healthy fats. Minimize simple carbs      Diabetes mellitus type 2 in obese ([facility])    hgba1c unacceptable, minimize simple carbs. Increase exercise as tolerated. He has been trying to eat better and his sugars have been improving.       Relevant Orders   Hemoglobin A1c   Hyperlipidemia, mixed    Encouraged heart healthy diet, increase exercise, avoid trans fats, consider a krill oil cap daily      Relevant Orders   Lipid panel   Depression    He is very sad  today. He is set to be laid off on 2/27 and is interviewing for a job next week but he is very depressed about this. He is tearful and he is trying to get through but feels very  down. He had stopped his Lexapro so he agrees to restart it      Essential hypertension    Well controlled, no changes to meds. Encouraged heart healthy diet such as the DASH diet and exercise as tolerated.       Relevant Orders   CBC with Differential/Platelet   Comprehensive metabolic panel   TSH   Hypotestosteronism - Primary    Check level      Relevant Orders   Testosterone   IBS (irritable bowel syndrome)    Has been struggling with multiple watery stool daily since he had bad news. Add benefiber and can use Imodium bid         I have discontinued Verbon Bacote's traZODone and busPIRone. I am also having him maintain his ONETOUCH [place] LANCETS, ONETOUCH VERIO, glucose blood, Insulin Pen Needle, lisinopril, metFORMIN, metoprolol succinate, meloxicam, clobetasol cream, Clobetasol Propionate, atorvastatin, fenofibrate, Secukinumab, glimepiride, Insulin Detemir, methylPREDNISolone, losartan, HYDROcodone-acetaminophen, mupirocin ointment, zolpidem, diazepam, insulin aspart, omeprazole, and escitalopram.  No orders of the defined types were placed in this encounter.   Penni Homans, MD

## 2018-09-28 ENCOUNTER — Ambulatory Visit: Payer: BLUE CROSS/BLUE SHIELD | Admitting: Family Medicine

## 2018-10-13 ENCOUNTER — Ambulatory Visit
Admission: EM | Admit: 2018-10-13 | Discharge: 2018-10-13 | Disposition: A | Discharge status: Home / Self Care | Payer: 59 | Attending: Emergency Medicine | Admitting: Emergency Medicine

## 2018-10-13 ENCOUNTER — Other Ambulatory Visit: Payer: Self-pay

## 2018-10-13 DIAGNOSIS — Z8639 Personal history of other endocrine, nutritional and metabolic disease: Secondary | ICD-10-CM | POA: Diagnosis not present

## 2018-10-13 DIAGNOSIS — G510 Bell's palsy: Secondary | ICD-10-CM | POA: Insufficient documentation

## 2018-10-13 LAB — GLUCOSE, CAPILLARY: GLUCOSE-CAPILLARY: 127 mg/dL — AB (ref 70–99)

## 2018-10-13 MED ORDER — ARTIFICIAL TEARS OPHTHALMIC OINT: TOPICAL_OINTMENT | Freq: Every day | OPHTHALMIC | 0 refills | Status: DC

## 2018-10-13 MED ORDER — POLYETHYL GLYCOL-PROPYL GLYCOL 0.4-0.3 % OP SOLN: 2.0000 [drp] | OPHTHALMIC | 0 refills | Status: DC | PRN

## 2018-10-13 MED ORDER — VALACYCLOVIR HCL 1 G PO TABS: 1000.0000 mg | ORAL_TABLET | Freq: Three times a day (TID) | ORAL | 0 refills | Status: AC

## 2018-10-13 MED ORDER — PREDNISONE 20 MG PO TABS: 60.0000 mg | ORAL_TABLET | Freq: Every day | ORAL | 0 refills | Status: AC

## 2018-10-13 NOTE — ED Triage Notes (Addendum)
Pt here for facial droop for 2 days, has a family history of bells palsy. It's on his right side and does look visibly swollen and has been placing ice on it and states it feels worse. Some numbness "as if he has been to the dentist" and did feel as if he had something in his right eye last night. His speech is affected by the drooping. Does want a corticosteroid shot that he googled.

## 2018-10-13 NOTE — ED Provider Notes (Signed)
HPI  SUBJECTIVE:  [name] is a 58 y.o. male who presents with 2 days of constant right facial droop, paresthesias/numbness on his right lips and facial swelling around his right lip.  He reports some slurred speech.  States he had a headache the night before his sx started 2 days ago, took some Advil, and woke up with the facial droop. It Has not changed since it started.  He reports the paresthesias around his lip as "bugs moving around".  No further headaches, taste changes, visual changes, no other facial, neck, ear pain, tinnitus.  No eye pain, no dry eye.  No nausea, vomiting, arm or leg weakness.  He tried ice with worsening of his symptoms.  No alleviating factors.  No recent tick bite.  He states thathe is swallowing and drinking without any problem.  He is requesting a steroid shot.  Past medical history of colon cancer status post surgery in 2015, psoriatic arthritis for which he takes a DMARD.  He is not currently on prednisone.  He has a history of diabetes, hypertension.  No history of stroke, aneurysm, Bell's palsy, shingles, chickenpox, syphilis.  Family history significant for brother with Bell's palsy.  PMD: Dr. Sherryll Burger in [facility].    Past Medical History:  Diagnosis Date  . Anemia 10/03/2009   Qualifier: Diagnosis of  By: [name]   . Anxiety associated with depression 10/11/2013  . Colon cancer ([place])    right colon cancer- adenocarcinoma CEA level isnrmal at 1.8  . Diabetes mellitus   . Diabetes mellitus type 2 in obese [facility]) 03/20/2010   Qualifier: Diagnosis of  By: [name]    . Erectile dysfunction 06/22/2016  . Essential hypertension 06/05/2014  . FATTY LIVER DISEASE 11/04/2009   Qualifier: Diagnosis of  By: [name] MD, Docia Chuck   Qualifier: Diagnosis of  By: [name] MD, Docia Chuck  Last Assessment & Plan:  conirmed by CT scan of abdomen in April 2016 encouraged to minimize simple carbs and fatty foods.  Marland Kitchen GERD 11/04/2009   Qualifier: Diagnosis of  By:  [name] MD, Docia Chuck   . Great toe pain, right 04/18/2017  . Hepatic artery stenosis ([place])   . History of colon cancer 01/02/2010   Qualifier: Diagnosis of  By: [name] Dr [name]  Last Assessment & Plan:  Follows closely with gastroenterology, Dr [name]  . Hyperlipidemia   . Hyperlipidemia, mixed 09/07/2010   Qualifier: Diagnosis of  By: [name]   . Hypertension   . Hypertriglyceridemia 12/14/2010  . Hypotestosteronism 06/22/2016  . INSOMNIA, CHRONIC 03/20/2010   Qualifier: Diagnosis of  By: [name] Ambien 10 mg daily does not keep asleep AdvilPM is over sedating and has trouble waking up   . Iron deficiency anemia 2011  . Low testosterone 06/22/2016  . Malignant neoplasm of colon ([place]) 01/02/2010   Qualifier: Diagnosis of  By: [name] Dr [name]   . Muscle spasm of back 06/05/2014  . Nipple pain 09/30/2016  . Obesity   . Palpitations 06/05/2014  . Preventative health care 10/08/2012  . Psoriasis   . Psoriatic arthritis ([place]) 08/25/2017  . Right knee pain 11/17/2011    Past Surgical History:  Procedure Laterality Date  . CARPAL TUNNEL RELEASE     RIGHT  . CHOLECYSTECTOMY  1994  . COLONOSCOPY    . HEMICOLECTOMY     12/17/2009 right  . POLYPECTOMY  Family History  Problem Relation Age of Onset  . Stomach cancer Mother        diedinher 50's  . Diabetes type II Brother        boderline  . Diabetes Brother        type II  . Alcohol abuse Brother   . Other Neg Hx        cad,prostate ca, colon ca  . Coronary artery disease Neg Hx   . Cancer Neg Hx        colon, prostate    Social History   Tobacco Use  . Smoking status: Current Some Day Smoker    Types: Cigarettes  . Smokeless tobacco: Never Used  Substance Use Topics  . Alcohol use: No  . Drug use: No    No current facility-administered medications for this encounter.   Current Outpatient Medications:  .  insulin aspart (NOVOLOG FLEXPEN) 100 UNIT/ML FlexPen, Inject 5 Units into the skin  3 (three) times daily with meals., Disp: , Rfl:  .  Insulin Detemir (LEVEMIR FLEXTOUCH) 100 UNIT/ML Pen, Inject 60 Units into the skin daily at 10 pm., Disp: 45 mL, Rfl: 3 .  metFORMIN (GLUCOPHAGE) 1000 MG tablet, TAKE 1 TABLET (1,000 MG TOTAL) BY MOUTH 2 (TWO) TIMES DAILY WITH A MEAL., Disp: 180 tablet, Rfl: 2 .  artificial tears (LACRILUBE) OINT ophthalmic ointment, Place into the right eye at bedtime., Disp: 3.5 g, Rfl: 0 .  atorvastatin (LIPITOR) 20 MG tablet, Take 1 tablet (20 mg total) by mouth daily., Disp: 90 tablet, Rfl: 3 .  Blood Glucose Monitoring Suppl (ONETOUCH VERIO) W/DEVICE KIT, , Disp: , Rfl: 0 .  clobetasol cream (TEMOVATE) 0.05 %, , Disp: , Rfl:  .  Clobetasol Propionate (CLOBEX SPRAY) 0.05 % external spray, , Disp: , Rfl:  .  diazepam (VALIUM) 5 MG tablet, TAKE 1 TABLET (5 MG TOTAL) BY MOUTH AT BEDTIME AS NEEDED FOR ANXIETY., Disp: 30 tablet, Rfl: 1 .  escitalopram (LEXAPRO) 10 MG tablet, TAKE 1 TABLET BY MOUTH EVERY DAY, Disp: 90 tablet, Rfl: 1 .  fenofibrate 160 MG tablet, Take 1 tablet (160 mg total) by mouth daily., Disp: 90 tablet, Rfl: 3 .  glimepiride (AMARYL) 1 MG tablet, TAKE 1 TABLET BY MOUTH EVERY DAY BEFORE BREAKFAST, Disp: 90 tablet, Rfl: 2 .  glucose blood (ONETOUCH VERIO) test strip, Test three times daily, DX E11.9, Disp: 300 each, Rfl: 6 .  HYDROcodone-acetaminophen (NORCO) 5-325 MG tablet, Take 1 tablet by mouth every 4 (four) hours as needed for moderate pain or severe pain., Disp: 30 tablet, Rfl: 0 .  Insulin Pen Needle (B-D ULTRAFINE III SHORT PEN) 31G X 8 MM MISC, Test as directed three times daily.  DX E11.9, Disp: 300 each, Rfl: 3 .  lisinopril (PRINIVIL,ZESTRIL) 5 MG tablet, TAKE 1 TABLET (5 MG TOTAL) BY MOUTH DAILY., Disp: 90 tablet, Rfl: 2 .  losartan (COZAAR) 50 MG tablet, TAKE 1 TABLET BY MOUTH EVERY DAY, Disp: 90 tablet, Rfl: 1 .  meloxicam (MOBIC) 15 MG tablet, Take 15 mg by mouth daily. , Disp: , Rfl:  .  metoprolol succinate (TOPROL-XL) 25 MG 24  hr tablet, TAKE 1 TABLET (25 MG TOTAL) BY MOUTH DAILY., Disp: 90 tablet, Rfl: 1 .  mupirocin ointment (BACTROBAN) 2 %, Place 1 application into the nose 2 (two) times daily., Disp: 22 g, Rfl: 0 .  omeprazole (PRILOSEC) 40 MG capsule, Take 1 capsule (40 mg total) by mouth 2 (two) times daily as needed.,  Disp: 180 capsule, Rfl: 1 .  ONETOUCH DELICA LANCETS MISC, , Disp: , Rfl:  .  Polyethyl Glycol-Propyl Glycol (SYSTANE) 0.4-0.3 % SOLN, Apply 2 drops to eye as needed. Every hour while awake, Disp: 15 mL, Rfl: 0 .  predniSONE (DELTASONE) 20 MG tablet, Take 3 tablets (60 mg total) by mouth daily for 7 days., Disp: 21 tablet, Rfl: 0 .  Secukinumab (COSENTYX 300 DOSE) 150 MG/ML SOSY, Inject 300 mg into the skin once monthly., Disp: , Rfl:  .  valACYclovir (VALTREX) 1000 MG tablet, Take 1 tablet (1,000 mg total) by mouth 3 (three) times daily for 7 days., Disp: 21 tablet, Rfl: 0 .  zolpidem (AMBIEN) 10 MG tablet, TAKE 1 TABLET (10 MG TOTAL) BY MOUTH AT BEDTIME AS NEEDED., Disp: 30 tablet, Rfl: 1  No Known Allergies   ROS  As noted in HPI.   Physical Exam  BP (!) 143/86 (BP Location: Right Arm)   Pulse 94   Temp 97.8 F (36.6 C) (Oral)   Resp 18   Wt 103 kg   SpO2 98%   BMI 34.52 kg/m   Constitutional: Well developed, well nourished, no acute distress Eyes: PERRLA, EOMI, conjunctiva normal bilaterally.  No direct or consensual photophobia.  No corneal ulcer, abrasion, dendrites seen on fluorescein exam. HENT: Normocephalic, atraumatic,mucus membranes moist.  Right external ear, external ear canal normal.  positive swelling inferior to the right lip. Respiratory: Normal inspiratory effort Cardiovascular: Normal rate GI: nondistended skin: No shoulder, neck, facial rash, skin intact Musculoskeletal: no deformities Neurologic: Alert & oriented x 3, positive right-sided facial droop, paralysis of the right forehead.  Patient unable to fully close his right eye.  No tongue deviation.  Cranial  nerves otherwise intact.  Finger-nose, heel shin within normal limits.  Negative Romberg, gait steady. Psychiatric: Speech and behavior appropriate   ED Course   Medications - No data to display  Orders Placed This Encounter  Procedures  . Glucose, capillary    Standing Status:   Standing    Number of Occurrences:   1    Results for orders placed or performed during the hospital encounter of 10/13/18 (from the past 24 hour(s))  Glucose, capillary     Status: Abnormal   Collection Time: 10/13/18  5:26 PM  Result Value Ref Range   Glucose-Capillary 127 (H) 70 - 99 mg/dL   No results found.  ED Clinical Impression  Bell's palsy   ED Assessment/Plan  Pt With a Bell's palsy.  It does not appear to be a stroke.  Given that he has some paresthesias and swelling around his lips, he may have early shingles.  However there is no rash today.  Will send home with prednisone 60 mg for 1 week.  Advised patient that this will elevate his sugars and to adjust his insulin accordingly.  Also valacyclovir 1 g 3 times daily for 1 week.  Lacri-Lube at night, Systane during the day.  Follow-up with PMD as needed, to the ER if he gets worse.  Discussed  MDM, treatment plan, and plan for follow-up with patient. Discussed sn/sx that should prompt return to the ED. patient agrees with plan.   Meds ordered this encounter  Medications  . artificial tears (LACRILUBE) OINT ophthalmic ointment    Sig: Place into the right eye at bedtime.    Dispense:  3.5 g    Refill:  0  . predniSONE (DELTASONE) 20 MG tablet    Sig: Take 3 tablets (60 mg  total) by mouth daily for 7 days.    Dispense:  21 tablet    Refill:  0  . Polyethyl Glycol-Propyl Glycol (SYSTANE) 0.4-0.3 % SOLN    Sig: Apply 2 drops to eye as needed. Every hour while awake    Dispense:  15 mL    Refill:  0  . valACYclovir (VALTREX) 1000 MG tablet    Sig: Take 1 tablet (1,000 mg total) by mouth 3 (three) times daily for 7 days.    Dispense:   21 tablet    Refill:  0    *This clinic note was created using [employer]. Therefore, there may be occasional mistakes despite careful proofreading.   ?   Melynda Ripple, MD 10/14/18 [phone]

## 2018-10-13 NOTE — Discharge Instructions (Signed)
It is important to take care of your eye.  Use the Systane during the day and the Lacri-Lube at night.  Rather than taping your eye, I would get an eye patch, close your eye for applying the Lacri-Lube, and then tape the patch over your eye.  Finish the prednisone and the Valtrex.  Follow-up with your doctor or return here if you notice a rash on your face.  Go to the ER for the signs and symptoms we discussed

## 2018-11-14 ENCOUNTER — Other Ambulatory Visit: Payer: Self-pay

## 2018-11-14 ENCOUNTER — Ambulatory Visit (INDEPENDENT_AMBULATORY_CARE_PROVIDER_SITE_OTHER): Payer: 59 | Admitting: Family Medicine

## 2018-11-14 ENCOUNTER — Ambulatory Visit: Payer: 59 | Admitting: Family Medicine

## 2018-11-14 DIAGNOSIS — L4050 Arthropathic psoriasis, unspecified: Secondary | ICD-10-CM | POA: Diagnosis not present

## 2018-11-14 DIAGNOSIS — E782 Mixed hyperlipidemia: Secondary | ICD-10-CM | POA: Diagnosis not present

## 2018-11-14 DIAGNOSIS — R002 Palpitations: Secondary | ICD-10-CM | POA: Diagnosis not present

## 2018-11-14 DIAGNOSIS — I10 Essential (primary) hypertension: Secondary | ICD-10-CM

## 2018-11-14 DIAGNOSIS — E1169 Type 2 diabetes mellitus with other specified complication: Secondary | ICD-10-CM | POA: Diagnosis not present

## 2018-11-14 DIAGNOSIS — E781 Pure hyperglyceridemia: Secondary | ICD-10-CM

## 2018-11-14 DIAGNOSIS — Z79899 Other long term (current) drug therapy: Secondary | ICD-10-CM | POA: Diagnosis not present

## 2018-11-14 DIAGNOSIS — L409 Psoriasis, unspecified: Secondary | ICD-10-CM | POA: Diagnosis not present

## 2018-11-14 DIAGNOSIS — G510 Bell's palsy: Secondary | ICD-10-CM

## 2018-11-14 DIAGNOSIS — E669 Obesity, unspecified: Secondary | ICD-10-CM

## 2018-11-14 DIAGNOSIS — K859 Acute pancreatitis without necrosis or infection, unspecified: Secondary | ICD-10-CM

## 2018-11-14 MED ORDER — INSULIN ASPART 100 UNIT/ML FLEXPEN: 5.0000 [IU] | PEN_INJECTOR | Freq: Three times a day (TID) | SUBCUTANEOUS | 1 refills | Status: DC

## 2018-11-14 MED ORDER — LISINOPRIL 5 MG PO TABS: 5.0000 mg | ORAL_TABLET | Freq: Every day | ORAL | 2 refills | Status: DC

## 2018-11-14 MED ORDER — INSULIN PEN NEEDLE 31G X 8 MM MISC: 3 refills | Status: DC

## 2018-11-14 MED ORDER — MUPIROCIN 2 % EX OINT: 1.0000 "application " | TOPICAL_OINTMENT | Freq: Two times a day (BID) | CUTANEOUS | 0 refills | Status: DC

## 2018-11-14 MED ORDER — METFORMIN HCL 1000 MG PO TABS: ORAL_TABLET | ORAL | 2 refills | Status: DC

## 2018-11-14 MED ORDER — ZOLPIDEM TARTRATE 10 MG PO TABS: 10.0000 mg | ORAL_TABLET | Freq: Every evening | ORAL | 1 refills | Status: DC | PRN

## 2018-11-14 MED ORDER — LOSARTAN POTASSIUM 50 MG PO TABS: ORAL_TABLET | ORAL | 1 refills | Status: DC

## 2018-11-14 MED ORDER — INSULIN DETEMIR 100 UNIT/ML FLEXPEN: 60.0000 [IU] | PEN_INJECTOR | Freq: Every day | SUBCUTANEOUS | 3 refills | Status: DC

## 2018-11-14 MED ORDER — FENOFIBRATE 160 MG PO TABS: 160.0000 mg | ORAL_TABLET | Freq: Every day | ORAL | 3 refills | Status: DC

## 2018-11-14 MED ORDER — METOPROLOL SUCCINATE ER 25 MG PO TB24: 25.0000 mg | ORAL_TABLET | Freq: Every day | ORAL | 1 refills | Status: DC

## 2018-11-14 MED ORDER — ATORVASTATIN CALCIUM 20 MG PO TABS: 20.0000 mg | ORAL_TABLET | Freq: Every day | ORAL | 3 refills | Status: DC

## 2018-11-14 MED ORDER — OMEPRAZOLE 40 MG PO CPDR: 40.0000 mg | DELAYED_RELEASE_CAPSULE | Freq: Two times a day (BID) | ORAL | 1 refills | Status: DC | PRN

## 2018-11-14 MED ORDER — DIAZEPAM 5 MG PO TABS: 5.0000 mg | ORAL_TABLET | Freq: Every evening | ORAL | 1 refills | Status: DC | PRN

## 2018-11-14 MED ORDER — ESCITALOPRAM OXALATE 10 MG PO TABS: 10.0000 mg | ORAL_TABLET | Freq: Every day | ORAL | 1 refills | Status: DC

## 2018-11-14 MED ORDER — GLIMEPIRIDE 1 MG PO TABS: ORAL_TABLET | ORAL | 2 refills | Status: DC

## 2018-11-14 NOTE — Assessment & Plan Note (Addendum)
Patient reports very control recently with swings ranging from 85 to 400. He has been under a great deal of stress with switching jobs and is eating poorly. He has been flexing his Levemir from 60-80 units a day. Encouraged to use a stable amount. Start at 66 units then increase by 2 units every couple of days til he starts seeing numbers approaching 100 then he should start adjusting the appropriate Novolog doses which he is also fluctuating strength wise and he is using it tid.

## 2018-11-15 DIAGNOSIS — G510 Bell's palsy: Secondary | ICD-10-CM | POA: Insufficient documentation

## 2018-11-15 MED ORDER — INSULIN DETEMIR 100 UNIT/ML FLEXPEN: 60.0000 [IU] | PEN_INJECTOR | Freq: Every day | SUBCUTANEOUS | 3 refills | Status: DC

## 2018-11-15 NOTE — Progress Notes (Signed)
Virtual Visit via Telephone Note  I connected with [name] on 11/15/18 at 12:00 PM EDT by a telephone enabled telemedicine application and verified that I am speaking with the correct person using two identifiers.   I discussed the limitations of evaluation and management by telemedicine and the availability of in person appointments. The patient expressed understanding and agreed to proceed. [name],CMA was able from the office to get patient set up on video platform but patient was unable to complete so visit was completed via telephone. Patient was at work and provider was at home during visit.     Subjective:    Patient ID: [name], male    DOB: 05/29/1961, 58 y.o.   MRN: [id number]  No chief complaint on file.   HPI Patient is in today for follow up on numerous chronic medical concerns including uncontrolled diabetes mellitus, hypertension, hyperlipidemia, anxiety and depression. He has changed jobs and while he is happy to have the new job it is significantly increased hours of work weekly and stressful just to be learning so many new things and so many new people. No recent febrile illness but he was seen in UC and diagnosed with right sided Bell's Palsy. He has been given steroids to manage this and this has put his sugars up to 400 some days. Low of 85. He is altering his doses of insulin to manage. He is improving he reports about 95% better. His anxiety is better just still present. Denies CP/palp/SOB/HA/congestion/fevers/GI or GU c/o. Taking meds as prescribed  Past Medical History:  Diagnosis Date  . Anemia 10/03/2009   Qualifier: Diagnosis of  By: [name]   . Anxiety associated with depression 10/11/2013  . Colon cancer ([place])    right colon cancer- adenocarcinoma CEA level isnrmal at 1.8  . Diabetes mellitus   . Diabetes mellitus type 2 in obese [facility]) 03/20/2010   Qualifier: Diagnosis of  By: [name]    . Erectile dysfunction 06/22/2016  .  Essential hypertension 06/05/2014  . FATTY LIVER DISEASE 11/04/2009   Qualifier: Diagnosis of  By: [name] MD, Docia Chuck   Qualifier: Diagnosis of  By: [name] MD, Docia Chuck  Last Assessment & Plan:  conirmed by CT scan of abdomen in April 2016 encouraged to minimize simple carbs and fatty foods.  Marland Kitchen GERD 11/04/2009   Qualifier: Diagnosis of  By: [name] MD, Docia Chuck   . Great toe pain, right 04/18/2017  . Hepatic artery stenosis ([place])   . History of colon cancer 01/02/2010   Qualifier: Diagnosis of  By: [name] Dr [name]  Last Assessment & Plan:  Follows closely with gastroenterology, Dr [name]  . Hyperlipidemia   . Hyperlipidemia, mixed 09/07/2010   Qualifier: Diagnosis of  By: [name]   . Hypertension   . Hypertriglyceridemia 12/14/2010  . Hypotestosteronism 06/22/2016  . INSOMNIA, CHRONIC 03/20/2010   Qualifier: Diagnosis of  By: [name] Ambien 10 mg daily does not keep asleep AdvilPM is over sedating and has trouble waking up   . Iron deficiency anemia 2011  . Low testosterone 06/22/2016  . Malignant neoplasm of colon ([place]) 01/02/2010   Qualifier: Diagnosis of  By: [name] Dr [name]   . Muscle spasm of back 06/05/2014  . Nipple pain 09/30/2016  . Obesity   . Palpitations 06/05/2014  . Preventative health care 10/08/2012  . Psoriasis   . Psoriatic arthritis ([employer]) 08/25/2017  .  Right knee pain 11/17/2011    Past Surgical History:  Procedure Laterality Date  . CARPAL TUNNEL RELEASE     RIGHT  . CHOLECYSTECTOMY  1994  . COLONOSCOPY    . HEMICOLECTOMY     12/17/2009 right  . POLYPECTOMY      Family History  Problem Relation Age of Onset  . Stomach cancer Mother        diedinher 38's  . Diabetes type II Brother        boderline  . Diabetes Brother        type II  . Alcohol abuse Brother   . Other Neg Hx        cad,prostate ca, colon ca  . Coronary artery disease Neg Hx   . Cancer Neg Hx        colon, prostate    Social History   Socioeconomic History  .  Marital status: Single    Spouse name: [name]  . Number of children: 0  . Years of education: Not on file  . Highest education level: Not on file  Occupational History  . Occupation: Merchant navy officer    Comment: Recruitment consultant  Social Needs  . Financial resource strain: Not on file  . Food insecurity:    Worry: Not on file    Inability: Not on file  . Transportation needs:    Medical: Not on file    Non-medical: Not on file  Tobacco Use  . Smoking status: Current Some Day Smoker    Types: Cigarettes  . Smokeless tobacco: Never Used  Substance and Sexual Activity  . Alcohol use: No  . Drug use: No  . Sexual activity: Yes    Comment: lives with girlfriend, no dietary restrictions  Lifestyle  . Physical activity:    Days per week: Not on file    Minutes per session: Not on file  . Stress: Not on file  Relationships  . Social connections:    Talks on phone: Not on file    Gets together: Not on file    Attends religious service: Not on file    Active member of club or organization: Not on file    Attends meetings of clubs or organizations: Not on file    Relationship status: Not on file  . Intimate partner violence:    Fear of current or ex partner: Not on file    Emotionally abused: Not on file    Physically abused: Not on file    Forced sexual activity: Not on file  Other Topics Concern  . Not on file  Social History Narrative   Occupation: Theatre stage manager and fax)   Single  (lives with [name])   no children    former smoker   Illicit Drug Use - no     Outpatient Medications Prior to Visit  Medication Sig Dispense Refill  . artificial tears (LACRILUBE) OINT ophthalmic ointment Place into the right eye at bedtime. 3.5 g 0  . Blood Glucose Monitoring Suppl (ONETOUCH VERIO) W/DEVICE KIT   0  . clobetasol cream (TEMOVATE) 0.05 %     . Clobetasol Propionate (CLOBEX SPRAY) 0.05 % external spray     . glucose blood (ONETOUCH VERIO) test strip Test three times daily, DX  E11.9 300 each 6  . HYDROcodone-acetaminophen (NORCO) 5-325 MG tablet Take 1 tablet by mouth every 4 (four) hours as needed for moderate pain or severe pain. 30 tablet 0  . meloxicam (MOBIC) 15 MG tablet Take  15 mg by mouth daily.     Glory Rosebush DELICA LANCETS MISC     . Polyethyl Glycol-Propyl Glycol (SYSTANE) 0.4-0.3 % SOLN Apply 2 drops to eye as needed. Every hour while awake 15 mL 0  . Secukinumab (COSENTYX 300 DOSE) 150 MG/ML SOSY Inject 300 mg into the skin once monthly.    Marland Kitchen atorvastatin (LIPITOR) 20 MG tablet Take 1 tablet (20 mg total) by mouth daily. 90 tablet 3  . diazepam (VALIUM) 5 MG tablet TAKE 1 TABLET (5 MG TOTAL) BY MOUTH AT BEDTIME AS NEEDED FOR ANXIETY. 30 tablet 1  . escitalopram (LEXAPRO) 10 MG tablet TAKE 1 TABLET BY MOUTH EVERY DAY 90 tablet 1  . fenofibrate 160 MG tablet Take 1 tablet (160 mg total) by mouth daily. 90 tablet 3  . glimepiride (AMARYL) 1 MG tablet TAKE 1 TABLET BY MOUTH EVERY DAY BEFORE BREAKFAST 90 tablet 2  . insulin aspart (NOVOLOG FLEXPEN) 100 UNIT/ML FlexPen Inject 5 Units into the skin 3 (three) times daily with meals.    . Insulin Detemir (LEVEMIR FLEXTOUCH) 100 UNIT/ML Pen Inject 60 Units into the skin daily at 10 pm. 45 mL 3  . Insulin Pen Needle (B-D ULTRAFINE III SHORT PEN) 31G X 8 MM MISC Test as directed three times daily.  DX E11.9 300 each 3  . lisinopril (PRINIVIL,ZESTRIL) 5 MG tablet TAKE 1 TABLET (5 MG TOTAL) BY MOUTH DAILY. 90 tablet 2  . losartan (COZAAR) 50 MG tablet TAKE 1 TABLET BY MOUTH EVERY DAY 90 tablet 1  . metFORMIN (GLUCOPHAGE) 1000 MG tablet TAKE 1 TABLET (1,000 MG TOTAL) BY MOUTH 2 (TWO) TIMES DAILY WITH A MEAL. 180 tablet 2  . metoprolol succinate (TOPROL-XL) 25 MG 24 hr tablet TAKE 1 TABLET (25 MG TOTAL) BY MOUTH DAILY. 90 tablet 1  . mupirocin ointment (BACTROBAN) 2 % Place 1 application into the nose 2 (two) times daily. 22 g 0  . omeprazole (PRILOSEC) 40 MG capsule Take 1 capsule (40 mg total) by mouth 2 (two) times  daily as needed. 180 capsule 1  . zolpidem (AMBIEN) 10 MG tablet TAKE 1 TABLET (10 MG TOTAL) BY MOUTH AT BEDTIME AS NEEDED. 30 tablet 1   No facility-administered medications prior to visit.     No Known Allergies  Review of Systems  Constitutional: Positive for malaise/fatigue. Negative for fever.  HENT: Negative for congestion.   Eyes: Negative for blurred vision.  Respiratory: Negative for shortness of breath.   Cardiovascular: Negative for chest pain, palpitations and leg swelling.  Gastrointestinal: Negative for abdominal pain, blood in stool and nausea.  Genitourinary: Negative for dysuria and frequency.  Musculoskeletal: Negative for falls.  Skin: Negative for rash.  Neurological: Positive for sensory change. Negative for dizziness, loss of consciousness and headaches.  Endo/Heme/Allergies: Negative for environmental allergies.  Psychiatric/Behavioral: Positive for depression. The patient is nervous/anxious.        Objective:    Physical Exam Unable to obtain due to telephone platform.  BP 140/86 (BP Location: Left Arm, Patient Position: Sitting, Cuff Size: Normal)   Wt 235 lb (106.6 kg)   BMI 35.73 kg/m  Wt Readings from Last 3 Encounters:  11/14/18 235 lb (106.6 kg)  10/13/18 227 lb (103 kg)  08/25/18 237 lb (107.5 kg)    Diabetic Foot Exam - Simple   No data filed     Lab Results  Component Value Date   WBC 10.7 (H) 08/25/2018   HGB 14.4 08/25/2018   HCT 41.8  08/25/2018   PLT 287.0 08/25/2018   GLUCOSE 147 (H) 08/25/2018   CHOL 142 08/25/2018   TRIG 238.0 (H) 08/25/2018   HDL 28.20 (L) 08/25/2018   LDLDIRECT 72.0 08/25/2018   LDLCALC 31 08/08/2017   ALT 27 08/25/2018   AST 16 08/25/2018   NA 139 08/25/2018   K 4.2 08/25/2018   CL 106 08/25/2018   CREATININE 0.79 08/25/2018   BUN 13 08/25/2018   CO2 24 08/25/2018   TSH 2.77 08/25/2018   PSA 0.27 04/18/2017   INR 0.9 10/27/2014   HGBA1C 8.1 (H) 08/25/2018   MICROALBUR 0.50 01/08/2013    Lab  Results  Component Value Date   TSH 2.77 08/25/2018   Lab Results  Component Value Date   WBC 10.7 (H) 08/25/2018   HGB 14.4 08/25/2018   HCT 41.8 08/25/2018   MCV 93.9 08/25/2018   PLT 287.0 08/25/2018   Lab Results  Component Value Date   NA 139 08/25/2018   K 4.2 08/25/2018   CO2 24 08/25/2018   GLUCOSE 147 (H) 08/25/2018   BUN 13 08/25/2018   CREATININE 0.79 08/25/2018   BILITOT 0.6 08/25/2018   ALKPHOS 55 08/25/2018   AST 16 08/25/2018   ALT 27 08/25/2018   PROT 6.6 08/25/2018   ALBUMIN 4.1 08/25/2018   CALCIUM 9.0 08/25/2018   ANIONGAP 4 (L) 10/30/2014   GFR 101.53 08/25/2018   Lab Results  Component Value Date   CHOL 142 08/25/2018   Lab Results  Component Value Date   HDL 28.20 (L) 08/25/2018   Lab Results  Component Value Date   LDLCALC 31 08/08/2017   Lab Results  Component Value Date   TRIG 238.0 (H) 08/25/2018   Lab Results  Component Value Date   CHOLHDL 5 08/25/2018   Lab Results  Component Value Date   HGBA1C 8.1 (H) 08/25/2018       Assessment & Plan:   Problem List Items Addressed This Visit    Diabetes mellitus type 2 in obese [facility])    Patient reports very control recently with swings ranging from 85 to 400. He has been under a great deal of stress with switching jobs and is eating poorly. He has been flexing his Levemir from 60-80 units a day. Encouraged to use a stable amount. Start at 66 units then increase by 2 units every couple of days til he starts seeing numbers approaching 100 then he should start adjusting the appropriate Novolog doses which he is also fluctuating strength wise and he is using it tid.       Relevant Medications   lisinopril (ZESTRIL) 5 MG tablet   losartan (COZAAR) 50 MG tablet   metFORMIN (GLUCOPHAGE) 1000 MG tablet   atorvastatin (LIPITOR) 20 MG tablet   Insulin Detemir (LEVEMIR FLEXTOUCH) 100 UNIT/ML Pen   glimepiride (AMARYL) 1 MG tablet   insulin aspart (NOVOLOG FLEXPEN) 100 UNIT/ML FlexPen    Hyperlipidemia, mixed   Relevant Medications   lisinopril (ZESTRIL) 5 MG tablet   losartan (COZAAR) 50 MG tablet   metoprolol succinate (TOPROL-XL) 25 MG 24 hr tablet   fenofibrate 160 MG tablet   atorvastatin (LIPITOR) 20 MG tablet   glimepiride (AMARYL) 1 MG tablet   Hypertriglyceridemia    Tolerating statin, encouraged heart healthy diet, avoid trans fats, minimize simple carbs and saturated fats. Increase exercise as tolerated      Relevant Medications   lisinopril (ZESTRIL) 5 MG tablet   losartan (COZAAR) 50 MG tablet   metoprolol  succinate (TOPROL-XL) 25 MG 24 hr tablet   fenofibrate 160 MG tablet   atorvastatin (LIPITOR) 20 MG tablet   Essential hypertension    no changes to meds. Encouraged heart healthy diet such as the DASH diet and exercise as tolerated. He is encouraged to try and monitor on an outpatient basis so we we can discuss at next visit and/or as needed.       Relevant Medications   lisinopril (ZESTRIL) 5 MG tablet   losartan (COZAAR) 50 MG tablet   metoprolol succinate (TOPROL-XL) 25 MG 24 hr tablet   fenofibrate 160 MG tablet   atorvastatin (LIPITOR) 20 MG tablet   glimepiride (AMARYL) 1 MG tablet   Right-sided Bell's palsy    Is improved to about 95% of baseline. He has had to use steroids and that has affected his sugars.       Relevant Medications   diazepam (VALIUM) 5 MG tablet   zolpidem (AMBIEN) 10 MG tablet   escitalopram (LEXAPRO) 10 MG tablet    Other Visit Diagnoses    Palpitations       Relevant Medications   glimepiride (AMARYL) 1 MG tablet   Acute pancreatitis       Relevant Medications   omeprazole (PRILOSEC) 40 MG capsule   glimepiride (AMARYL) 1 MG tablet      I have changed Jered Ronda's lisinopril, escitalopram, and insulin aspart. I am also having him maintain his OneTouch Delica Lancets, OneTouch Verio, glucose blood, meloxicam, clobetasol cream, Clobetasol Propionate, Secukinumab, HYDROcodone-acetaminophen, artificial  tears, Polyethyl Glycol-Propyl Glycol, losartan, mupirocin ointment, diazepam, metoprolol succinate, metFORMIN, fenofibrate, atorvastatin, Insulin Detemir, zolpidem, Insulin Pen Needle, omeprazole, and glimepiride.  Meds ordered this encounter  Medications  . lisinopril (ZESTRIL) 5 MG tablet    Sig: Take 1 tablet (5 mg total) by mouth daily.    Dispense:  90 tablet    Refill:  2  . losartan (COZAAR) 50 MG tablet    Sig: TAKE 1 TABLET BY MOUTH EVERY DAY    Dispense:  90 tablet    Refill:  1  . mupirocin ointment (BACTROBAN) 2 %    Sig: Place 1 application into the nose 2 (two) times daily.    Dispense:  22 g    Refill:  0  . diazepam (VALIUM) 5 MG tablet    Sig: Take 1 tablet (5 mg total) by mouth at bedtime as needed for anxiety.    Dispense:  30 tablet    Refill:  1    This request is for a new prescription for a controlled substance as required by Federal/State law.  . metoprolol succinate (TOPROL-XL) 25 MG 24 hr tablet    Sig: Take 1 tablet (25 mg total) by mouth daily.    Dispense:  90 tablet    Refill:  1    PT REQUESTS 90 DAYS  . metFORMIN (GLUCOPHAGE) 1000 MG tablet    Sig: TAKE 1 TABLET (1,000 MG TOTAL) BY MOUTH 2 (TWO) TIMES DAILY WITH A MEAL.    Dispense:  180 tablet    Refill:  2  . fenofibrate 160 MG tablet    Sig: Take 1 tablet (160 mg total) by mouth daily.    Dispense:  90 tablet    Refill:  3  . atorvastatin (LIPITOR) 20 MG tablet    Sig: Take 1 tablet (20 mg total) by mouth daily.    Dispense:  90 tablet    Refill:  3  . Insulin Detemir (LEVEMIR FLEXTOUCH)  100 UNIT/ML Pen    Sig: Inject 60 Units into the skin daily at 10 pm.    Dispense:  45 mL    Refill:  3  . zolpidem (AMBIEN) 10 MG tablet    Sig: Take 1 tablet (10 mg total) by mouth at bedtime as needed.    Dispense:  30 tablet    Refill:  1    This request is for a new prescription for a controlled substance as required by Federal/State law.  . Insulin Pen Needle (B-D ULTRAFINE III SHORT PEN) 31G X  8 MM MISC    Sig: Test as directed three times daily.  DX E11.9    Dispense:  300 each    Refill:  3  . omeprazole (PRILOSEC) 40 MG capsule    Sig: Take 1 capsule (40 mg total) by mouth 2 (two) times daily as needed.    Dispense:  180 capsule    Refill:  1  . escitalopram (LEXAPRO) 10 MG tablet    Sig: Take 1 tablet (10 mg total) by mouth daily.    Dispense:  90 tablet    Refill:  1  . glimepiride (AMARYL) 1 MG tablet    Sig: TAKE 1 TABLET BY MOUTH EVERY DAY BEFORE BREAKFAST    Dispense:  90 tablet    Refill:  2  . insulin aspart (NOVOLOG FLEXPEN) 100 UNIT/ML FlexPen    Sig: Inject 5-20 Units into the skin 3 (three) times daily with meals.    Dispense:  45 mL    Refill:  1      I discussed the assessment and treatment plan with the patient. The patient was provided an opportunity to ask questions and all were answered. The patient agreed with the plan and demonstrated an understanding of the instructions.   The patient was advised to call back or seek an in-person evaluation if the symptoms worsen or if the condition fails to improve as anticipated.  I provided 25 minutes of non-face-to-face time during this encounter.   Penni Homans, MD

## 2018-11-15 NOTE — Assessment & Plan Note (Signed)
no changes to meds. Encouraged heart healthy diet such as the DASH diet and exercise as tolerated. He is encouraged to try and monitor on an outpatient basis so we we can discuss at next visit and/or as needed.

## 2018-11-15 NOTE — Assessment & Plan Note (Signed)
Tolerating statin, encouraged heart healthy diet, avoid trans fats, minimize simple carbs and saturated fats. Increase exercise as tolerated 

## 2018-11-15 NOTE — Assessment & Plan Note (Signed)
Is improved to about 95% of baseline. He has had to use steroids and that has affected his sugars.

## 2018-11-17 ENCOUNTER — Telehealth: Payer: Self-pay

## 2018-11-17 NOTE — Telephone Encounter (Signed)
Change him to Humalog from Novolog as long as he agrees, please check with him. He may have enough already. Then send in script once he knows

## 2018-11-17 NOTE — Telephone Encounter (Signed)
Novolog not covered, preferred may be Humalog.

## 2018-11-20 ENCOUNTER — Telehealth: Payer: Self-pay

## 2018-11-20 NOTE — Telephone Encounter (Signed)
Which medication is this for we switched him to humalog Please advise

## 2018-11-20 NOTE — Telephone Encounter (Signed)
For Novolog.

## 2018-11-20 NOTE — Telephone Encounter (Signed)
PA denied. The requested medication and/or diagnosis is not a covered benefit and excluded from coverage in accordance with terms and conditions of Pt's plan benefits.

## 2018-11-20 NOTE — Telephone Encounter (Signed)
PA initiated via Covermymeds; KEY: [id number]. Awaiting determination.

## 2018-11-20 NOTE — Telephone Encounter (Signed)
I answered this last week I thought ok to switch to humalog

## 2018-11-21 ENCOUNTER — Encounter: Payer: Self-pay | Admitting: Family Medicine

## 2018-11-21 NOTE — Telephone Encounter (Signed)
Patient was switched to Humalog, which should be covered.

## 2018-11-21 NOTE — Telephone Encounter (Signed)
Done

## 2018-11-23 MED ORDER — INSULIN LISPRO (1 UNIT DIAL) 100 UNIT/ML (KWIKPEN): PEN_INJECTOR | SUBCUTANEOUS | 11 refills | Status: DC

## 2018-11-23 MED ORDER — BASAGLAR KWIKPEN 100 UNIT/ML ~~LOC~~ SOPN: PEN_INJECTOR | SUBCUTANEOUS | 3 refills | Status: DC

## 2018-12-14 ENCOUNTER — Encounter: Payer: Self-pay | Admitting: Internal Medicine

## 2018-12-19 ENCOUNTER — Telehealth: Payer: Self-pay | Admitting: Family Medicine

## 2018-12-19 NOTE — Telephone Encounter (Signed)
Pt came in office stating is wanting an advise about going to [facility], pt states has schedule an appt with his gastro on 01-15-2019 at 1:30 for a colonoscopy, pt has to check in at the office at 12:30 on that day, pt is wanting to know since pt is diabetic is it ok for him to have it done at this time and also wanted to inform since he has taken this day off from work he can not change the day of the appt. Pt informed his las colonoscopy was done 4 years ago and is needing this appt. (the office of gastro informed pt they do not have any appts in the morning and til this date) pt would like to be advise please call tel [phone].

## 2018-12-20 NOTE — Telephone Encounter (Signed)
Yes he should do it now. Now is the safest time in the foreseeable future. Due to the recent lock down

## 2018-12-20 NOTE — Telephone Encounter (Signed)
Please advise 

## 2018-12-20 NOTE — Telephone Encounter (Signed)
Spoke with patient and let him know about message below. He voiced his understanding.

## 2019-01-01 ENCOUNTER — Other Ambulatory Visit: Payer: Self-pay

## 2019-01-01 ENCOUNTER — Ambulatory Visit (AMBULATORY_SURGERY_CENTER): Payer: Self-pay

## 2019-01-01 VITALS — Ht 68.0 in | Wt 235.0 lb

## 2019-01-01 DIAGNOSIS — Z85038 Personal history of other malignant neoplasm of large intestine: Secondary | ICD-10-CM

## 2019-01-01 MED ORDER — NA SULFATE-K SULFATE-MG SULF 17.5-3.13-1.6 GM/177ML PO SOLN: 1.0000 | Freq: Once | ORAL | 0 refills | Status: AC

## 2019-01-01 NOTE — Progress Notes (Signed)
Denies allergies to eggs or soy products. Denies complication of anesthesia or sedation. Denies use of weight loss medication. Denies use of O2.   Emmi instructions given for colonoscopy.  Pre-Visit was conducted by phone due to Covid 19. Instructions were reviewed and mailed to patients confirmed home address. A 15.00 coupon for Suprep was given to the patient. Patient was encouraged to call if he had questions or concerns regarding instructions.

## 2019-01-03 ENCOUNTER — Encounter: Payer: Self-pay | Admitting: Internal Medicine

## 2019-01-12 ENCOUNTER — Telehealth: Payer: Self-pay | Admitting: Internal Medicine

## 2019-01-12 NOTE — Telephone Encounter (Signed)

## 2019-01-15 ENCOUNTER — Encounter: Payer: Self-pay | Admitting: Internal Medicine

## 2019-01-15 ENCOUNTER — Ambulatory Visit (AMBULATORY_SURGERY_CENTER): Payer: 59 | Admitting: Internal Medicine

## 2019-01-15 ENCOUNTER — Other Ambulatory Visit: Payer: Self-pay

## 2019-01-15 VITALS — BP 136/91 | HR 86 | Temp 98.8°F | Resp 12 | Ht 68.0 in | Wt 235.0 lb

## 2019-01-15 DIAGNOSIS — D123 Benign neoplasm of transverse colon: Secondary | ICD-10-CM | POA: Diagnosis not present

## 2019-01-15 DIAGNOSIS — D124 Benign neoplasm of descending colon: Secondary | ICD-10-CM

## 2019-01-15 DIAGNOSIS — Z85038 Personal history of other malignant neoplasm of large intestine: Secondary | ICD-10-CM

## 2019-01-15 MED ORDER — SODIUM CHLORIDE 0.9 % IV SOLN: 500.0000 mL | Freq: Once | INTRAVENOUS | Status: DC

## 2019-01-15 NOTE — Op Note (Signed)
[place] Patient Name: [name] Procedure Date: 01/15/2019 1:21 PM MRN: [id number] Endoscopist: Docia Chuck. [name] , MD Age: 58 Referring MD:  Date of Birth: 1960/11/05 Gender: Male Account #: [id number] Procedure:                Colonoscopy with cold snare polypectomy x 4 Indications:              High risk colon cancer surveillance: Personal                            history of colon cancer. Right-sided colon cancer                            May 2011 status post right hemicolectomy. Follow-up                            examinations 2011, 2012, 2014 Medicines:                Monitored Anesthesia Care Procedure:                Pre-Anesthesia Assessment:                           - Prior to the procedure, a History and Physical                            was performed, and patient medications and                            allergies were reviewed. The patient's tolerance of                            previous anesthesia was also reviewed. The risks                            and benefits of the procedure and the sedation                            options and risks were discussed with the patient.                            All questions were answered, and informed consent                            was obtained. Prior Anticoagulants: The patient has                            taken no previous anticoagulant or antiplatelet                            agents. ASA Grade Assessment: II - A patient with                            mild systemic disease. After reviewing the risks  and benefits, the patient was deemed in                            satisfactory condition to undergo the procedure.                           After obtaining informed consent, the colonoscope                            was passed under direct vision. Throughout the                            procedure, the patient's blood pressure, pulse, and   oxygen saturations were monitored continuously. The                            Colonoscope was introduced through the anus and                            advanced to the the ileocolonic anastomosis. The                            rectum was photographed. The quality of the bowel                            preparation was excellent. The colonoscopy was                            performed without difficulty. The patient tolerated                            the procedure well. The bowel preparation used was                            SUPREP via split dose instruction. Scope In: 1:38:14 PM Scope Out: 1:49:39 PM Scope Withdrawal Time: 0 hours 8 minutes 49 seconds  Total Procedure Duration: 0 hours 11 minutes 25 seconds  Findings:                 Four polyps were found in the descending colon and                            transverse colon. The polyps were 2 to 8 mm in                            size. These polyps were removed with a cold snare.                            Resection and retrieval were complete.                           The patient is status post right hemicolectomy. The  exam was otherwise without abnormality on direct                            and retroflexion views. Complications:            No immediate complications. Estimated blood loss:                            None. Estimated Blood Loss:     Estimated blood loss: none. Impression:               - Four 2 to 8 mm polyps in the descending colon and                            in the transverse colon, removed with a cold snare.                            Resected and retrieved.                           -Status post right hemicolectomy. The examination                            was otherwise normal on direct and retroflexion                            views. Recommendation:           - Repeat colonoscopy in 3 years for surveillance.                           - Patient has a contact number  available for                            emergencies. The signs and symptoms of potential                            delayed complications were discussed with the                            patient. Return to normal activities tomorrow.                            Written discharge instructions were provided to the                            patient.                           - Resume previous diet.                           - Continue present medications.                           - Await pathology results. Docia Chuck. [name], MD 01/15/2019 1:56:44 PM This report has been signed electronically.

## 2019-01-15 NOTE — Progress Notes (Signed)
Report to PACU, RN, vss, BBS= Clear.  

## 2019-01-15 NOTE — Progress Notes (Signed)
Pt's states no medical or surgical changes since previsit or office visit. Vital signs [place]

## 2019-01-15 NOTE — Patient Instructions (Signed)
Thank you for allowing [place] to participate in your care today!  Await pathology results by mail, approximately 2 weeks.  Recommend next surveillance colonoscopy in  3 years.  Resume previous diet and medications today.  Return to your normal activities tomorrow.      YOU HAD AN ENDOSCOPIC PROCEDURE TODAY AT [facility]:   Refer to the procedure report that was given to you for any specific questions about what was found during the examination.  If the procedure report does not answer your questions, please call your gastroenterologist to clarify.  If you requested that your care partner not be given the details of your procedure findings, then the procedure report has been included in a sealed envelope for you to review at your convenience later.  YOU SHOULD EXPECT: Some feelings of bloating in the abdomen. Passage of more gas than usual.  Walking can help get rid of the air that was put into your GI tract during the procedure and reduce the bloating. If you had a lower endoscopy (such as a colonoscopy or flexible sigmoidoscopy) you may notice spotting of blood in your stool or on the toilet paper. If you underwent a bowel prep for your procedure, you may not have a normal bowel movement for a few days.  Please Note:  You might notice some irritation and congestion in your nose or some drainage.  This is from the oxygen used during your procedure.  There is no need for concern and it should clear up in a day or so.  SYMPTOMS TO REPORT IMMEDIATELY:   Following lower endoscopy (colonoscopy or flexible sigmoidoscopy):  Excessive amounts of blood in the stool  Significant tenderness or worsening of abdominal pains  Swelling of the abdomen that is new, acute  Fever of 100F or higher   For urgent or emergent issues, a gastroenterologist can be reached at any hour by calling [phone].   DIET:  We do recommend a small meal at first, but then you may proceed to your  regular diet.  Drink plenty of fluids but you should avoid alcoholic beverages for 24 hours.  ACTIVITY:  You should plan to take it easy for the rest of today and you should NOT DRIVE or use heavy machinery until tomorrow (because of the sedation medicines used during the test).    FOLLOW UP: Our staff will call the number listed on your records 48-72 hours following your procedure to check on you and address any questions or concerns that you may have regarding the information given to you following your procedure. If we do not reach you, we will leave a message.  We will attempt to reach you two times.  During this call, we will ask if you have developed any symptoms of COVID 19. If you develop any symptoms (ie: fever, flu-like symptoms, shortness of breath, cough etc.) before then, please call [phone].  If you test positive for Covid 19 in the 2 weeks post procedure, please call and report this information to [place].    If any biopsies were taken you will be contacted by phone or by letter within the next 1-3 weeks.  Please call us at [phone] if you have not heard about the biopsies in 3 weeks.    SIGNATURES/CONFIDENTIALITY: You and/or your care partner have signed paperwork which will be entered into your electronic medical record.  These signatures attest to the fact that that the information above on your After Visit Summary has  been reviewed and is understood.  Full responsibility of the confidentiality of this discharge information lies with you and/or your care-partner.

## 2019-01-17 ENCOUNTER — Telehealth: Payer: Self-pay

## 2019-01-17 NOTE — Telephone Encounter (Signed)
  Follow up Call-  Call back number 01/15/2019  Post procedure Call Back phone  # [phone]  Permission to leave phone message Yes  Some recent data might be hidden     Patient questions:  Do you have a fever, pain , or abdominal swelling? No. Pain Score  0 *  Have you tolerated food without any problems? Yes.    Have you been able to return to your normal activities? Yes.    Do you have any questions about your discharge instructions: Diet   No. Medications  No. Follow up visit  No.  Do you have questions or concerns about your Care? No.  Actions: * If pain score is 4 or above: No action needed, pain <4. 1. Have you developed a fever since your procedure? no  2.   Have you had an respiratory symptoms (SOB or cough) since your procedure? no  3.   Have you tested positive for COVID 19 since your procedure no  4.   Have you had any family members/close contacts diagnosed with the COVID 19 since your procedure?  no   If yes to any of these questions please route to Joylene John, RN and Alphonsa Gin, Therapist, sports.

## 2019-01-19 ENCOUNTER — Encounter: Payer: Self-pay | Admitting: Internal Medicine

## 2019-04-16 ENCOUNTER — Other Ambulatory Visit: Payer: Self-pay | Admitting: Family Medicine

## 2019-04-17 NOTE — Telephone Encounter (Signed)
Requesting:valium & ambien  Contract:yes UDS:n/a Last OV:11/14/18 Next OV:n/a Last Refill: Valium 11/14/18  #30-0rf Ambien 11/14/18 #30-1rf Database:   Please advise

## 2019-06-10 ENCOUNTER — Telehealth: Payer: Self-pay | Admitting: Family Medicine

## 2019-06-11 NOTE — Telephone Encounter (Signed)
Needs virtual visit 

## 2019-06-11 NOTE — Telephone Encounter (Signed)
Requesting:ambien  Contract:yes [id number] one  Last OV:11/14/18 Next OV:n/a Last Refill:04/17/19  #30-0rf Database:   Please advise

## 2019-06-11 NOTE — Telephone Encounter (Signed)
He is overdue for an appt. I have sent in 15# but he needs an appt for further refills

## 2019-06-12 NOTE — Telephone Encounter (Signed)
Notified pt that appt is needed. He said new ins starts 12/1 with new company. He will check what his copays are and get familiar with the insurance and will call back at a later time to schedule virtual visit.

## 2019-06-20 ENCOUNTER — Encounter: Payer: Self-pay | Admitting: Family Medicine

## 2019-06-22 ENCOUNTER — Other Ambulatory Visit: Payer: Self-pay

## 2019-06-22 NOTE — Telephone Encounter (Signed)
Can you get him scheduled for a virtual visit for late one Tuesday or Thursday  Please advise

## 2019-06-25 ENCOUNTER — Ambulatory Visit: Payer: 59 | Admitting: Family Medicine

## 2019-06-26 ENCOUNTER — Ambulatory Visit: Payer: BC Managed Care – PPO | Admitting: Family Medicine

## 2019-06-28 ENCOUNTER — Ambulatory Visit: Payer: BC Managed Care – PPO | Admitting: Family Medicine

## 2019-06-28 ENCOUNTER — Other Ambulatory Visit: Payer: Self-pay

## 2019-06-28 DIAGNOSIS — L4050 Arthropathic psoriasis, unspecified: Secondary | ICD-10-CM

## 2019-06-28 DIAGNOSIS — E1169 Type 2 diabetes mellitus with other specified complication: Secondary | ICD-10-CM | POA: Diagnosis not present

## 2019-06-28 DIAGNOSIS — I10 Essential (primary) hypertension: Secondary | ICD-10-CM | POA: Diagnosis not present

## 2019-06-28 DIAGNOSIS — E782 Mixed hyperlipidemia: Secondary | ICD-10-CM

## 2019-06-28 DIAGNOSIS — E669 Obesity, unspecified: Secondary | ICD-10-CM

## 2019-06-28 DIAGNOSIS — F418 Other specified anxiety disorders: Secondary | ICD-10-CM

## 2019-06-28 MED ORDER — METOPROLOL SUCCINATE ER 25 MG PO TB24: 25.0000 mg | ORAL_TABLET | Freq: Every day | ORAL | 1 refills | Status: DC

## 2019-06-28 MED ORDER — DIAZEPAM 5 MG PO TABS: ORAL_TABLET | ORAL | 2 refills | Status: DC

## 2019-06-28 MED ORDER — MUPIROCIN 2 % EX OINT: 1.0000 "application " | TOPICAL_OINTMENT | Freq: Two times a day (BID) | CUTANEOUS | 0 refills | Status: DC

## 2019-06-28 MED ORDER — METHYLPREDNISOLONE 4 MG PO TABS: ORAL_TABLET | ORAL | 1 refills | Status: DC

## 2019-07-02 NOTE — Assessment & Plan Note (Signed)
Well controlled, no changes to meds. Encouraged heart healthy diet such as the DASH diet and exercise as tolerated.  °

## 2019-07-02 NOTE — Assessment & Plan Note (Signed)
He refuses lab work as he believes his numbers will be up as he has not been eating well due to all of his as he has changed jobs twice.

## 2019-07-02 NOTE — Assessment & Plan Note (Signed)
Encouraged heart healthy diet, increase exercise, avoid trans fats, consider a krill oil cap daily, tolerating Atorvastatin 

## 2019-07-02 NOTE — Assessment & Plan Note (Signed)
Very stressed is allowed to use Diazepam prn and he will notify us if it worsens.

## 2019-07-02 NOTE — Assessment & Plan Note (Signed)
Has intermittent flares and noted Medrol dosepaks help the most. Is given a refill. He will follow up with rheumatology.

## 2019-07-02 NOTE — Progress Notes (Signed)
Subjective:    Patient ID: [name], male    DOB: 1961-03-25, 58 y.o.   MRN: [id number]  No chief complaint on file.   HPI Patient is in today for follow up on chronic medical concerns including diabetes, depression, hypertension and more. No recent febrile illness or hospitalizations. No polyuria or polydipsia. He has been very stressed due to 2 recent job changes. He is imbetween jobs now. He acknowledges he has not been eating well or exercising. Denies CP/palp/SOB/HA/congestion/fevers/GI or GU c/o. Taking meds as prescribed  Past Medical History:  Diagnosis Date  . Anemia 10/03/2009   Qualifier: Diagnosis of  By: [name]   . Anxiety associated with depression 10/11/2013  . Colon cancer ([place])    right colon cancer- adenocarcinoma CEA level isnrmal at 1.8  . Diabetes mellitus   . Diabetes mellitus type 2 in obese [facility]) 03/20/2010   Qualifier: Diagnosis of  By: [name]    . Erectile dysfunction 06/22/2016  . Essential hypertension 06/05/2014  . FATTY LIVER DISEASE 11/04/2009   Qualifier: Diagnosis of  By: [name] MD, Docia Chuck   Qualifier: Diagnosis of  By: [name] MD, Docia Chuck  Last Assessment & Plan:  conirmed by CT scan of abdomen in April 2016 encouraged to minimize simple carbs and fatty foods.  [place] Kitchen GERD 11/04/2009   Qualifier: Diagnosis of  By: [name] MD, Docia Chuck   . Great toe pain, right 04/18/2017  . Hepatic artery stenosis ([place])   . History of colon cancer 01/02/2010   Qualifier: Diagnosis of  By: [name] Dr [name]  Last Assessment & Plan:  Follows closely with gastroenterology, Dr [name]  . Hyperlipidemia   . Hyperlipidemia, mixed 09/07/2010   Qualifier: Diagnosis of  By: [name]   . Hypertension   . Hypertriglyceridemia 12/14/2010  . Hypotestosteronism 06/22/2016  . INSOMNIA, CHRONIC 03/20/2010   Qualifier: Diagnosis of  By: [name] Ambien 10 mg daily does not keep asleep AdvilPM is over sedating and has trouble waking up   . Iron  deficiency anemia 2011  . Low testosterone 06/22/2016  . Malignant neoplasm of colon ([place]) 01/02/2010   Qualifier: Diagnosis of  By: [name] Dr [name]   . Muscle spasm of back 06/05/2014  . Nipple pain 09/30/2016  . Obesity   . Palpitations 06/05/2014  . Preventative health care 10/08/2012  . Psoriasis   . Psoriatic arthritis ([place]) 08/25/2017  . Right knee pain 11/17/2011    Past Surgical History:  Procedure Laterality Date  . CARPAL TUNNEL RELEASE     RIGHT  . CHOLECYSTECTOMY  1994  . COLONOSCOPY    . HEMICOLECTOMY     12/17/2009 right  . POLYPECTOMY      Family History  Problem Relation Age of Onset  . Stomach cancer Mother        diedinher 43's  . Diabetes type II Brother        boderline  . Diabetes Brother        type II  . Alcohol abuse Brother   . Other Neg Hx        cad,prostate ca, colon ca  . Coronary artery disease Neg Hx   . Cancer Neg Hx        colon, prostate  . Colon cancer Neg Hx   . Esophageal cancer Neg Hx   . Rectal cancer Neg Hx     Social History  Socioeconomic History  . Marital status: Single    Spouse name: [name]  . Number of children: 0  . Years of education: Not on file  . Highest education level: Not on file  Occupational History  . Occupation: Merchant navy officer    Comment: Recruitment consultant  Tobacco Use  . Smoking status: Current Some Day Smoker    Types: Cigarettes  . Smokeless tobacco: Never Used  Substance and Sexual Activity  . Alcohol use: No  . Drug use: No  . Sexual activity: Yes    Comment: lives with girlfriend, no dietary restrictions  Other Topics Concern  . Not on file  Social History Narrative   Occupation: Theatre stage manager and fax)   Single  (lives with [name])   no children    former smoker   Illicit Drug Use - no    Social Determinants of Radio broadcast assistant Strain:   . Difficulty of Paying Living Expenses: Not on file  Food Insecurity:   . Worried About Charity fundraiser in the Last Year: Not  on file  . Ran Out of Food in the Last Year: Not on file  Transportation Needs:   . Lack of Transportation (Medical): Not on file  . Lack of Transportation (Non-Medical): Not on file  Physical Activity:   . Days of Exercise per Week: Not on file  . Minutes of Exercise per Session: Not on file  Stress:   . Feeling of Stress : Not on file  Social Connections:   . Frequency of Communication with Friends and Family: Not on file  . Frequency of Social Gatherings with Friends and Family: Not on file  . Attends Religious Services: Not on file  . Active Member of Clubs or Organizations: Not on file  . Attends Archivist Meetings: Not on file  . Marital Status: Not on file  Intimate Partner Violence:   . Fear of Current or Ex-Partner: Not on file  . Emotionally Abused: Not on file  . Physically Abused: Not on file  . Sexually Abused: Not on file    Outpatient Medications Prior to Visit  Medication Sig Dispense Refill  . atorvastatin (LIPITOR) 20 MG tablet Take 1 tablet (20 mg total) by mouth daily. (Patient not taking: Reported on 01/01/2019) 90 tablet 3  . Blood Glucose Monitoring Suppl (ONETOUCH VERIO) W/DEVICE KIT   0  . clobetasol cream (TEMOVATE) 0.05 %     . escitalopram (LEXAPRO) 10 MG tablet Take 1 tablet (10 mg total) by mouth daily. (Patient not taking: Reported on 01/01/2019) 90 tablet 1  . fenofibrate 160 MG tablet Take 1 tablet (160 mg total) by mouth daily. (Patient not taking: Reported on 01/01/2019) 90 tablet 3  . glucose blood (ONETOUCH VERIO) test strip Test three times daily, DX E11.9 300 each 6  . HYDROcodone-acetaminophen (NORCO) 5-325 MG tablet Take 1 tablet by mouth every 4 (four) hours as needed for moderate pain or severe pain. (Patient not taking: Reported on 01/01/2019) 30 tablet 0  . Insulin Glargine (BASAGLAR KWIKPEN) 100 UNIT/ML SOPN 60-80 units daily at 10pm 15 pen 3  . insulin lispro (HUMALOG KWIKPEN) 100 UNIT/ML KwikPen 5-20 Units TID with meals 15 mL  11  . Insulin Pen Needle (B-D ULTRAFINE III SHORT PEN) 31G X 8 MM MISC Test as directed three times daily.  DX E11.9 300 each 3  . lisinopril (ZESTRIL) 5 MG tablet Take 1 tablet (5 mg total) by mouth daily. (Patient not taking:  Reported on 01/01/2019) 90 tablet 2  . losartan (COZAAR) 50 MG tablet TAKE 1 TABLET BY MOUTH EVERY DAY (Patient not taking: Reported on 01/01/2019) 90 tablet 1  . meloxicam (MOBIC) 15 MG tablet Take 15 mg by mouth daily.     . metFORMIN (GLUCOPHAGE) 1000 MG tablet TAKE 1 TABLET (1,000 MG TOTAL) BY MOUTH 2 (TWO) TIMES DAILY WITH A MEAL. 180 tablet 2  . omeprazole (PRILOSEC) 40 MG capsule Take 1 capsule (40 mg total) by mouth 2 (two) times daily as needed. (Patient not taking: Reported on 01/01/2019) 180 capsule 1  . [place] LANCETS MISC     . Polyethyl Glycol-Propyl Glycol (SYSTANE) 0.4-0.3 % SOLN Apply 2 drops to eye as needed. Every hour while awake (Patient not taking: Reported on 01/01/2019) 15 mL 0  . Secukinumab (COSENTYX 300 DOSE) 150 MG/ML SOSY Inject 300 mg into the skin once monthly.    . zolpidem (AMBIEN) 10 MG tablet TAKE 1 TABLET(10 MG) BY MOUTH AT BEDTIME AS NEEDED 15 tablet 0  . diazepam (VALIUM) 5 MG tablet TAKE 1 TABLET(5 MG) BY MOUTH AT BEDTIME AS NEEDED FOR ANXIETY 30 tablet 2  . metoprolol succinate (TOPROL-XL) 25 MG 24 hr tablet Take 1 tablet (25 mg total) by mouth daily. (Patient not taking: Reported on 01/01/2019) 90 tablet 1  . mupirocin ointment (BACTROBAN) 2 % Place 1 application into the nose 2 (two) times daily. (Patient not taking: Reported on 01/01/2019) 22 g 0   No facility-administered medications prior to visit.    No Known Allergies  Review of Systems  Constitutional: Positive for malaise/fatigue. Negative for chills and fever.  HENT: Negative for congestion and hearing loss.   Eyes: Negative for discharge.  Respiratory: Negative for cough, sputum production and shortness of breath.   Cardiovascular: Negative for chest pain,  palpitations and leg swelling.  Gastrointestinal: Negative for abdominal pain, blood in stool, constipation, diarrhea, heartburn, nausea and vomiting.  Genitourinary: Negative for dysuria, frequency, hematuria and urgency.  Musculoskeletal: Positive for joint pain. Negative for back pain, falls and myalgias.  Skin: Positive for itching and rash.  Neurological: Negative for dizziness, sensory change, loss of consciousness, weakness and headaches.  Endo/Heme/Allergies: Negative for environmental allergies. Does not bruise/bleed easily.  Psychiatric/Behavioral: Positive for depression. Negative for suicidal ideas. The patient is nervous/anxious. The patient does not have insomnia.        Objective:    Physical Exam Vitals and nursing note reviewed.  Constitutional:      General: He is not in acute distress.    Appearance: He is well-developed.  HENT:     Head: Normocephalic and atraumatic.     Nose: Nose normal.  Eyes:     General:        Right eye: No discharge.        Left eye: No discharge.  Cardiovascular:     Rate and Rhythm: Normal rate and regular rhythm.     Heart sounds: No murmur.  Pulmonary:     Effort: Pulmonary effort is normal.     Breath sounds: Normal breath sounds.  Abdominal:     General: Bowel sounds are normal.     Palpations: Abdomen is soft.     Tenderness: There is no abdominal tenderness.  Musculoskeletal:     Cervical back: Normal range of motion and neck supple.  Skin:    General: Skin is warm and dry.     Findings: Erythema and rash present.     Comments:  Legs with thick plaques and scattered erythematous maculopapular lesion  Neurological:     Mental Status: He is alert and oriented to person, place, and time.     BP 126/82 (BP Location: Left Arm, Patient Position: Sitting, Cuff Size: Normal)   Pulse 99   Temp 98.6 F (37 C) (Temporal)   Resp 18   Wt 220 lb 6.4 oz (100 kg)   SpO2 98%   BMI 33.51 kg/m  Wt Readings from Last 3 Encounters:    06/28/19 220 lb 6.4 oz (100 kg)  01/15/19 235 lb (106.6 kg)  01/01/19 235 lb (106.6 kg)    Diabetic Foot Exam - Simple   No data filed     Lab Results  Component Value Date   WBC 10.7 (H) 08/25/2018   HGB 14.4 08/25/2018   HCT 41.8 08/25/2018   PLT 287.0 08/25/2018   GLUCOSE 147 (H) 08/25/2018   CHOL 142 08/25/2018   TRIG 238.0 (H) 08/25/2018   HDL 28.20 (L) 08/25/2018   LDLDIRECT 72.0 08/25/2018   LDLCALC 31 08/08/2017   ALT 27 08/25/2018   AST 16 08/25/2018   NA 139 08/25/2018   K 4.2 08/25/2018   CL 106 08/25/2018   CREATININE 0.79 08/25/2018   BUN 13 08/25/2018   CO2 24 08/25/2018   TSH 2.77 08/25/2018   PSA 0.27 04/18/2017   INR 0.9 10/27/2014   HGBA1C 8.1 (H) 08/25/2018   MICROALBUR 0.50 01/08/2013    Lab Results  Component Value Date   TSH 2.77 08/25/2018   Lab Results  Component Value Date   WBC 10.7 (H) 08/25/2018   HGB 14.4 08/25/2018   HCT 41.8 08/25/2018   MCV 93.9 08/25/2018   PLT 287.0 08/25/2018   Lab Results  Component Value Date   NA 139 08/25/2018   K 4.2 08/25/2018   CO2 24 08/25/2018   GLUCOSE 147 (H) 08/25/2018   BUN 13 08/25/2018   CREATININE 0.79 08/25/2018   BILITOT 0.6 08/25/2018   ALKPHOS 55 08/25/2018   AST 16 08/25/2018   ALT 27 08/25/2018   PROT 6.6 08/25/2018   ALBUMIN 4.1 08/25/2018   CALCIUM 9.0 08/25/2018   ANIONGAP 4 (L) 10/30/2014   GFR 101.53 08/25/2018   Lab Results  Component Value Date   CHOL 142 08/25/2018   Lab Results  Component Value Date   HDL 28.20 (L) 08/25/2018   Lab Results  Component Value Date   LDLCALC 31 08/08/2017   Lab Results  Component Value Date   TRIG 238.0 (H) 08/25/2018   Lab Results  Component Value Date   CHOLHDL 5 08/25/2018   Lab Results  Component Value Date   HGBA1C 8.1 (H) 08/25/2018       Assessment & Plan:   Problem List Items Addressed This Visit    Diabetes mellitus type 2 in obese [facility])    He refuses lab work as he believes his numbers will be up as  he has not been eating well due to all of his as he has changed jobs twice.       Hyperlipidemia, mixed    Encouraged heart healthy diet, increase exercise, avoid trans fats, consider a krill oil cap daily, tolerating Atorvastatin.       Relevant Medications   metoprolol succinate (TOPROL-XL) 25 MG 24 hr tablet   Depression with anxiety    Very stressed is allowed to use Diazepam prn and he will notify us if it worsens.  Relevant Medications   diazepam (VALIUM) 5 MG tablet   Essential hypertension    Well controlled, no changes to meds. Encouraged heart healthy diet such as the DASH diet and exercise as tolerated.       Relevant Medications   metoprolol succinate (TOPROL-XL) 25 MG 24 hr tablet   Psoriatic arthritis (H[facility])    Has intermittent flares and noted Medrol dosepaks help the most. Is given a refill. He will follow up with rheumatology.       Relevant Medications   methylPREDNISolone (MEDROL) 4 MG tablet      I am having [name] start on methylPREDNISolone. I am also having him maintain his OneTouch [place] Lancets, OneTouch Verio, glucose blood, meloxicam, clobetasol cream, Secukinumab, HYDROcodone-acetaminophen, Polyethyl Glycol-Propyl Glycol, lisinopril, losartan, metFORMIN, fenofibrate, atorvastatin, Insulin Pen Needle, omeprazole, escitalopram, insulin lispro, Basaglar KwikPen, zolpidem, metoprolol succinate, mupirocin ointment, and diazepam.  Meds ordered this encounter  Medications  . metoprolol succinate (TOPROL-XL) 25 MG 24 hr tablet    Sig: Take 1 tablet (25 mg total) by mouth daily.    Dispense:  90 tablet    Refill:  1    PT REQUESTS 90 DAYS  . methylPREDNISolone (MEDROL) 4 MG tablet    Sig: 5 tab po qd X 1d then 4 tab po qd X 1d then 3 tab po qd X 1d then 2 tab po qd then 1 tab po qd    Dispense:  15 tablet    Refill:  1  . mupirocin ointment (BACTROBAN) 2 %    Sig: Place 1 application into the nose 2 (two) times daily.    Dispense:  22 g     Refill:  0  . diazepam (VALIUM) 5 MG tablet    Sig: TAKE 1 TABLET(5 MG) BY MOUTH AT BEDTIME AS NEEDED FOR ANXIETY    Dispense:  30 tablet    Refill:  2     Penni Homans, MD

## 2019-07-03 ENCOUNTER — Ambulatory Visit: Payer: BC Managed Care – PPO | Admitting: Family Medicine

## 2019-09-20 ENCOUNTER — Other Ambulatory Visit: Payer: Self-pay | Admitting: Family Medicine

## 2019-09-20 ENCOUNTER — Encounter: Payer: Self-pay | Admitting: Family Medicine

## 2019-09-20 MED ORDER — FOLIC ACID 1 MG PO TABS: 1.0000 mg | ORAL_TABLET | Freq: Every day | ORAL | 3 refills | Status: DC

## 2019-09-20 MED ORDER — METHOTREXATE 2.5 MG PO TABS: 10.0000 mg | ORAL_TABLET | ORAL | 2 refills | Status: DC

## 2019-10-27 ENCOUNTER — Encounter
Admission: EM | Disposition: A | Discharge status: Home / Self Care | Payer: Self-pay | Source: Home / Self Care | Attending: Cardiovascular Disease

## 2019-10-27 ENCOUNTER — Other Ambulatory Visit: Payer: Self-pay

## 2019-10-27 ENCOUNTER — Inpatient Hospital Stay
Admission: EM | Admit: 2019-10-27 | Discharge: 2019-10-29 | DRG: 246 | Disposition: A | Discharge status: Home / Self Care | Payer: Commercial Managed Care - PPO | Attending: Internal Medicine | Admitting: Internal Medicine

## 2019-10-27 DIAGNOSIS — I2102 ST elevation (STEMI) myocardial infarction involving left anterior descending coronary artery: Secondary | ICD-10-CM

## 2019-10-27 DIAGNOSIS — E785 Hyperlipidemia, unspecified: Secondary | ICD-10-CM

## 2019-10-27 DIAGNOSIS — F17210 Nicotine dependence, cigarettes, uncomplicated: Secondary | ICD-10-CM | POA: Diagnosis present

## 2019-10-27 DIAGNOSIS — E1165 Type 2 diabetes mellitus with hyperglycemia: Secondary | ICD-10-CM | POA: Diagnosis present

## 2019-10-27 DIAGNOSIS — Z79899 Other long term (current) drug therapy: Secondary | ICD-10-CM

## 2019-10-27 DIAGNOSIS — E1169 Type 2 diabetes mellitus with other specified complication: Secondary | ICD-10-CM | POA: Diagnosis not present

## 2019-10-27 DIAGNOSIS — Z720 Tobacco use: Secondary | ICD-10-CM | POA: Diagnosis not present

## 2019-10-27 DIAGNOSIS — I10 Essential (primary) hypertension: Secondary | ICD-10-CM | POA: Diagnosis not present

## 2019-10-27 DIAGNOSIS — I213 ST elevation (STEMI) myocardial infarction of unspecified site: Secondary | ICD-10-CM

## 2019-10-27 DIAGNOSIS — F418 Other specified anxiety disorders: Secondary | ICD-10-CM | POA: Diagnosis present

## 2019-10-27 DIAGNOSIS — I708 Atherosclerosis of other arteries: Secondary | ICD-10-CM | POA: Diagnosis present

## 2019-10-27 DIAGNOSIS — Z9049 Acquired absence of other specified parts of digestive tract: Secondary | ICD-10-CM

## 2019-10-27 DIAGNOSIS — Z833 Family history of diabetes mellitus: Secondary | ICD-10-CM | POA: Diagnosis not present

## 2019-10-27 DIAGNOSIS — Z6834 Body mass index (BMI) 34.0-34.9, adult: Secondary | ICD-10-CM

## 2019-10-27 DIAGNOSIS — Z20822 Contact with and (suspected) exposure to covid-19: Secondary | ICD-10-CM | POA: Diagnosis present

## 2019-10-27 DIAGNOSIS — I110 Hypertensive heart disease with heart failure: Secondary | ICD-10-CM | POA: Diagnosis present

## 2019-10-27 DIAGNOSIS — Z8042 Family history of malignant neoplasm of prostate: Secondary | ICD-10-CM | POA: Diagnosis not present

## 2019-10-27 DIAGNOSIS — E782 Mixed hyperlipidemia: Secondary | ICD-10-CM | POA: Diagnosis present

## 2019-10-27 DIAGNOSIS — D72829 Elevated white blood cell count, unspecified: Secondary | ICD-10-CM | POA: Diagnosis present

## 2019-10-27 DIAGNOSIS — I255 Ischemic cardiomyopathy: Secondary | ICD-10-CM | POA: Diagnosis present

## 2019-10-27 DIAGNOSIS — Z85038 Personal history of other malignant neoplasm of large intestine: Secondary | ICD-10-CM | POA: Diagnosis present

## 2019-10-27 DIAGNOSIS — L4050 Arthropathic psoriasis, unspecified: Secondary | ICD-10-CM | POA: Diagnosis present

## 2019-10-27 DIAGNOSIS — I252 Old myocardial infarction: Secondary | ICD-10-CM | POA: Diagnosis present

## 2019-10-27 DIAGNOSIS — Z791 Long term (current) use of non-steroidal anti-inflammatories (NSAID): Secondary | ICD-10-CM | POA: Diagnosis not present

## 2019-10-27 DIAGNOSIS — I959 Hypotension, unspecified: Secondary | ICD-10-CM | POA: Diagnosis not present

## 2019-10-27 DIAGNOSIS — I2510 Atherosclerotic heart disease of native coronary artery without angina pectoris: Secondary | ICD-10-CM | POA: Diagnosis not present

## 2019-10-27 DIAGNOSIS — I2109 ST elevation (STEMI) myocardial infarction involving other coronary artery of anterior wall: Principal | ICD-10-CM | POA: Diagnosis present

## 2019-10-27 DIAGNOSIS — E669 Obesity, unspecified: Secondary | ICD-10-CM | POA: Diagnosis present

## 2019-10-27 DIAGNOSIS — Z794 Long term (current) use of insulin: Secondary | ICD-10-CM

## 2019-10-27 DIAGNOSIS — K219 Gastro-esophageal reflux disease without esophagitis: Secondary | ICD-10-CM | POA: Diagnosis present

## 2019-10-27 DIAGNOSIS — Z800 Family history of malignant neoplasm of digestive organs: Secondary | ICD-10-CM

## 2019-10-27 DIAGNOSIS — K7200 Acute and subacute hepatic failure without coma: Secondary | ICD-10-CM | POA: Diagnosis not present

## 2019-10-27 DIAGNOSIS — I5021 Acute systolic (congestive) heart failure: Secondary | ICD-10-CM | POA: Diagnosis present

## 2019-10-27 DIAGNOSIS — E119 Type 2 diabetes mellitus without complications: Secondary | ICD-10-CM | POA: Diagnosis present

## 2019-10-27 DIAGNOSIS — G4700 Insomnia, unspecified: Secondary | ICD-10-CM | POA: Diagnosis present

## 2019-10-27 HISTORY — PX: CORONARY/GRAFT ACUTE MI REVASCULARIZATION: CATH118305

## 2019-10-27 HISTORY — PX: LEFT HEART CATH AND CORONARY ANGIOGRAPHY: CATH118249

## 2019-10-27 LAB — CBC WITH DIFFERENTIAL/PLATELET
Abs Immature Granulocytes: 0.13 10*3/uL — ABNORMAL HIGH (ref 0.00–0.07)
Basophils Absolute: 0.1 10*3/uL (ref 0.0–0.1)
Basophils Relative: 1 %
Eosinophils Absolute: 0.1 10*3/uL (ref 0.0–0.5)
Eosinophils Relative: 0 %
HCT: 42.7 % (ref 39.0–52.0)
Hemoglobin: 14.8 g/dL (ref 13.0–17.0)
Immature Granulocytes: 1 %
Lymphocytes Relative: 12 %
Lymphs Abs: 2.1 10*3/uL (ref 0.7–4.0)
MCH: 32.5 pg (ref 26.0–34.0)
MCHC: 34.7 g/dL (ref 30.0–36.0)
MCV: 93.8 fL (ref 80.0–100.0)
Monocytes Absolute: 1.2 10*3/uL — ABNORMAL HIGH (ref 0.1–1.0)
Monocytes Relative: 7 %
Neutro Abs: 13.8 10*3/uL — ABNORMAL HIGH (ref 1.7–7.7)
Neutrophils Relative %: 79 %
Platelets: 433 10*3/uL — ABNORMAL HIGH (ref 150–400)
RBC: 4.55 MIL/uL (ref 4.22–5.81)
RDW: 13.3 % (ref 11.5–15.5)
WBC: 17.4 10*3/uL — ABNORMAL HIGH (ref 4.0–10.5)
nRBC: 0 % (ref 0.0–0.2)

## 2019-10-27 LAB — LIPID PANEL
Cholesterol: 221 mg/dL — ABNORMAL HIGH (ref 0–200)
HDL: 31 mg/dL — ABNORMAL LOW (ref >40)
LDL Cholesterol: UNDETERMINED mg/dL (ref 0–99)
Total CHOL/HDL Ratio: 7.1 RATIO
Triglycerides: 453 mg/dL — ABNORMAL HIGH (ref <150)
VLDL: UNDETERMINED mg/dL (ref 0–40)

## 2019-10-27 LAB — COMPREHENSIVE METABOLIC PANEL
ALT: 18 U/L (ref 0–44)
AST: 27 U/L (ref 15–41)
Albumin: 3.9 g/dL (ref 3.5–5.0)
Alkaline Phosphatase: 75 U/L (ref 38–126)
Anion gap: 11 (ref 5–15)
BUN: 8 mg/dL (ref 6–20)
CO2: 21 mmol/L — ABNORMAL LOW (ref 22–32)
Calcium: 8.7 mg/dL — ABNORMAL LOW (ref 8.9–10.3)
Chloride: 105 mmol/L (ref 98–111)
Creatinine, Ser: 0.7 mg/dL (ref 0.61–1.24)
GFR calc Af Amer: >60 mL/min (ref >60)
GFR calc non Af Amer: >60 mL/min (ref >60)
Glucose, Bld: 273 mg/dL — ABNORMAL HIGH (ref 70–99)
Potassium: 3.5 mmol/L (ref 3.5–5.1)
Sodium: 137 mmol/L (ref 135–145)
Total Bilirubin: 1.6 mg/dL — ABNORMAL HIGH (ref 0.3–1.2)
Total Protein: 7.5 g/dL (ref 6.5–8.1)

## 2019-10-27 LAB — APTT: aPTT: 26 seconds (ref 24–36)

## 2019-10-27 LAB — POCT ACTIVATED CLOTTING TIME: Activated Clotting Time: 318 seconds

## 2019-10-27 LAB — TROPONIN I (HIGH SENSITIVITY)
Troponin I (High Sensitivity): 783 ng/L (ref <18)
Troponin I (High Sensitivity): >27000 ng/L (ref <18)
Troponin I (High Sensitivity): >27000 ng/L (ref <18)
Troponin I (High Sensitivity): >27000 ng/L (ref <18)

## 2019-10-27 LAB — RESPIRATORY PANEL BY RT PCR (FLU A&B, COVID)
Influenza A by PCR: NEGATIVE
Influenza B by PCR: NEGATIVE
SARS Coronavirus 2 by RT PCR: NEGATIVE

## 2019-10-27 LAB — GLUCOSE, CAPILLARY
Glucose-Capillary: 261 mg/dL — ABNORMAL HIGH (ref 70–99)
Glucose-Capillary: 271 mg/dL — ABNORMAL HIGH (ref 70–99)

## 2019-10-27 LAB — MAGNESIUM: Magnesium: 1.9 mg/dL (ref 1.7–2.4)

## 2019-10-27 LAB — PROTIME-INR
INR: 1 (ref 0.8–1.2)
Prothrombin Time: 12.8 seconds (ref 11.4–15.2)

## 2019-10-27 LAB — MRSA PCR SCREENING: MRSA by PCR: NEGATIVE

## 2019-10-27 LAB — POTASSIUM: Potassium: 3.5 mmol/L (ref 3.5–5.1)

## 2019-10-27 SURGERY — CORONARY/GRAFT ACUTE MI REVASCULARIZATION
Anesthesia: Moderate Sedation

## 2019-10-27 MED ORDER — VERAPAMIL HCL 2.5 MG/ML IV SOLN
INTRAVENOUS | Status: DC | PRN
  Administered 2019-10-27: 2.5 mg via INTRA_ARTERIAL

## 2019-10-27 MED ORDER — ONDANSETRON HCL 4 MG/2ML IJ SOLN
INTRAMUSCULAR | Status: AC
  Filled 2019-10-27: qty 2

## 2019-10-27 MED ORDER — PANTOPRAZOLE SODIUM 40 MG PO TBEC
40.0000 mg | DELAYED_RELEASE_TABLET | Freq: Every day | ORAL | Status: DC
  Administered 2019-10-27 – 2019-10-29 (×3): 40 mg via ORAL
  Filled 2019-10-27 (×3): qty 1

## 2019-10-27 MED ORDER — IOHEXOL 300 MG/ML  SOLN
INTRAMUSCULAR | Status: DC | PRN
  Administered 2019-10-27: 15:00:00 135 mL

## 2019-10-27 MED ORDER — ASPIRIN 81 MG PO CHEW
81.0000 mg | CHEWABLE_TABLET | Freq: Every day | ORAL | Status: DC
  Administered 2019-10-27 – 2019-10-29 (×3): 81 mg via ORAL
  Filled 2019-10-27 (×3): qty 1

## 2019-10-27 MED ORDER — HEPARIN SODIUM (PORCINE) 1000 UNIT/ML IJ SOLN
INTRAMUSCULAR | Status: DC | PRN
  Administered 2019-10-27: 7000 [IU] via INTRAVENOUS

## 2019-10-27 MED ORDER — ONDANSETRON HCL 4 MG/2ML IJ SOLN
INTRAMUSCULAR | Status: DC | PRN
  Administered 2019-10-27: 4 mg via INTRAVENOUS

## 2019-10-27 MED ORDER — PRASUGREL HCL 10 MG PO TABS
ORAL_TABLET | ORAL | Status: DC | PRN
  Administered 2019-10-27: 60 mg via ORAL

## 2019-10-27 MED ORDER — INSULIN ASPART 100 UNIT/ML ~~LOC~~ SOLN
0.0000 [IU] | Freq: Every day | SUBCUTANEOUS | Status: DC
  Administered 2019-10-27: 3 [IU] via SUBCUTANEOUS
  Administered 2019-10-28: 2 [IU] via SUBCUTANEOUS
  Filled 2019-10-27 (×2): qty 1

## 2019-10-27 MED ORDER — NITROGLYCERIN 1 MG/10 ML FOR IR/CATH LAB
INTRA_ARTERIAL | Status: AC
  Filled 2019-10-27: qty 10

## 2019-10-27 MED ORDER — HEPARIN SODIUM (PORCINE) 5000 UNIT/ML IJ SOLN: 60.0000 [IU]/kg | Freq: Once | INTRAMUSCULAR | Status: DC

## 2019-10-27 MED ORDER — SODIUM CHLORIDE 0.9 % IV SOLN: 250.0000 mL | INTRAVENOUS | Status: DC | PRN

## 2019-10-27 MED ORDER — ESCITALOPRAM OXALATE 10 MG PO TABS
10.0000 mg | ORAL_TABLET | Freq: Every day | ORAL | Status: DC
  Administered 2019-10-27 – 2019-10-29 (×3): 10 mg via ORAL
  Filled 2019-10-27 (×3): qty 1

## 2019-10-27 MED ORDER — FENTANYL CITRATE (PF) 100 MCG/2ML IJ SOLN
INTRAMUSCULAR | Status: AC
  Filled 2019-10-27: qty 2

## 2019-10-27 MED ORDER — SODIUM CHLORIDE 0.9 % IV SOLN: INTRAVENOUS | Status: DC

## 2019-10-27 MED ORDER — PRASUGREL HCL 10 MG PO TABS
10.0000 mg | ORAL_TABLET | Freq: Every day | ORAL | Status: DC
  Administered 2019-10-28 – 2019-10-29 (×2): 10 mg via ORAL
  Filled 2019-10-27 (×2): qty 1

## 2019-10-27 MED ORDER — FUROSEMIDE 10 MG/ML IJ SOLN
INTRAMUSCULAR | Status: AC
  Filled 2019-10-27: qty 4

## 2019-10-27 MED ORDER — HEPARIN (PORCINE) 25000 UT/250ML-% IV SOLN: 1150.0000 [IU]/h | INTRAVENOUS | Status: DC

## 2019-10-27 MED ORDER — DIAZEPAM 5 MG PO TABS
5.0000 mg | ORAL_TABLET | Freq: Every evening | ORAL | Status: DC | PRN
  Administered 2019-10-27 – 2019-10-29 (×2): 5 mg via ORAL
  Filled 2019-10-27 (×2): qty 1

## 2019-10-27 MED ORDER — SODIUM CHLORIDE 0.9% FLUSH
3.0000 mL | Freq: Two times a day (BID) | INTRAVENOUS | Status: DC
  Administered 2019-10-27 – 2019-10-29 (×5): 3 mL via INTRAVENOUS

## 2019-10-27 MED ORDER — ONDANSETRON HCL 4 MG/2ML IJ SOLN: 4.0000 mg | Freq: Four times a day (QID) | INTRAMUSCULAR | Status: DC | PRN

## 2019-10-27 MED ORDER — VERAPAMIL HCL 2.5 MG/ML IV SOLN
INTRAVENOUS | Status: AC
  Filled 2019-10-27: qty 2

## 2019-10-27 MED ORDER — ASPIRIN 81 MG PO CHEW: 324.0000 mg | CHEWABLE_TABLET | Freq: Once | ORAL | Status: DC

## 2019-10-27 MED ORDER — CHLORHEXIDINE GLUCONATE CLOTH 2 % EX PADS
6.0000 | MEDICATED_PAD | Freq: Every day | CUTANEOUS | Status: DC
  Administered 2019-10-27 – 2019-10-28 (×2): 6 via TOPICAL

## 2019-10-27 MED ORDER — TIROFIBAN HCL IN NACL 5-0.9 MG/100ML-% IV SOLN
0.1500 ug/kg/min | INTRAVENOUS | Status: DC
  Administered 2019-10-27 – 2019-10-28 (×3): 0.15 ug/kg/min via INTRAVENOUS
  Filled 2019-10-27 (×6): qty 100

## 2019-10-27 MED ORDER — ACETAMINOPHEN 325 MG PO TABS: 650.0000 mg | ORAL_TABLET | ORAL | Status: DC | PRN

## 2019-10-27 MED ORDER — TIROFIBAN (AGGRASTAT) BOLUS VIA INFUSION
INTRAVENOUS | Status: DC | PRN
  Administered 2019-10-27: 15:00:00 2395 ug via INTRAVENOUS

## 2019-10-27 MED ORDER — MIDAZOLAM HCL 2 MG/2ML IJ SOLN
INTRAMUSCULAR | Status: AC
  Filled 2019-10-27: qty 2

## 2019-10-27 MED ORDER — ENOXAPARIN SODIUM 40 MG/0.4ML ~~LOC~~ SOLN
40.0000 mg | SUBCUTANEOUS | Status: DC
  Administered 2019-10-28 – 2019-10-29 (×2): 40 mg via SUBCUTANEOUS
  Filled 2019-10-27 (×2): qty 0.4

## 2019-10-27 MED ORDER — TIROFIBAN HCL IN NACL 5-0.9 MG/100ML-% IV SOLN
INTRAVENOUS | Status: AC
  Filled 2019-10-27: qty 100

## 2019-10-27 MED ORDER — INSULIN ASPART 100 UNIT/ML ~~LOC~~ SOLN
0.0000 [IU] | Freq: Three times a day (TID) | SUBCUTANEOUS | Status: DC
  Administered 2019-10-27 – 2019-10-29 (×6): 5 [IU] via SUBCUTANEOUS
  Filled 2019-10-27 (×6): qty 1

## 2019-10-27 MED ORDER — FUROSEMIDE 10 MG/ML IJ SOLN
INTRAMUSCULAR | Status: DC | PRN
  Administered 2019-10-27: 40 mg via INTRAVENOUS

## 2019-10-27 MED ORDER — HYDROCODONE-ACETAMINOPHEN 5-325 MG PO TABS
1.0000 | ORAL_TABLET | ORAL | Status: DC | PRN
  Administered 2019-10-27 (×2): 1 via ORAL
  Filled 2019-10-27 (×2): qty 1

## 2019-10-27 MED ORDER — CARVEDILOL 3.125 MG PO TABS
3.1250 mg | ORAL_TABLET | Freq: Two times a day (BID) | ORAL | Status: DC
  Administered 2019-10-27 – 2019-10-28 (×2): 3.125 mg via ORAL
  Filled 2019-10-27 (×3): qty 1

## 2019-10-27 MED ORDER — HEPARIN SODIUM (PORCINE) 1000 UNIT/ML IJ SOLN
INTRAMUSCULAR | Status: AC
  Filled 2019-10-27: qty 1

## 2019-10-27 MED ORDER — MIDAZOLAM HCL 2 MG/2ML IJ SOLN
INTRAMUSCULAR | Status: DC | PRN
  Administered 2019-10-27 (×2): 1 mg via INTRAVENOUS

## 2019-10-27 MED ORDER — NITROGLYCERIN 1 MG/10 ML FOR IR/CATH LAB
INTRA_ARTERIAL | Status: DC | PRN
  Administered 2019-10-27: 100 ug via INTRACORONARY

## 2019-10-27 MED ORDER — TRAZODONE HCL 50 MG PO TABS
50.0000 mg | ORAL_TABLET | Freq: Every evening | ORAL | Status: DC | PRN
  Administered 2019-10-27 – 2019-10-29 (×2): 50 mg via ORAL
  Filled 2019-10-27 (×2): qty 1

## 2019-10-27 MED ORDER — TIROFIBAN HCL IV 12.5 MG/250 ML: INTRAVENOUS | Status: DC | PRN

## 2019-10-27 MED ORDER — HEPARIN SODIUM (PORCINE) 1000 UNIT/ML IJ SOLN
4000.0000 [IU] | Freq: Once | INTRAMUSCULAR | Status: AC
  Administered 2019-10-27: 4000 [IU] via INTRAVENOUS

## 2019-10-27 MED ORDER — HEPARIN (PORCINE) IN NACL 1000-0.9 UT/500ML-% IV SOLN
INTRAVENOUS | Status: DC | PRN
  Administered 2019-10-27: 500 mL

## 2019-10-27 MED ORDER — TIROFIBAN HCL IN NACL 5-0.9 MG/100ML-% IV SOLN
INTRAVENOUS | Status: AC | PRN
  Administered 2019-10-27: 0.15 ug/kg/min via INTRAVENOUS

## 2019-10-27 MED ORDER — ATORVASTATIN CALCIUM 80 MG PO TABS
80.0000 mg | ORAL_TABLET | Freq: Every day | ORAL | Status: DC
  Administered 2019-10-27 – 2019-10-28 (×2): 80 mg via ORAL
  Filled 2019-10-27 (×2): qty 4

## 2019-10-27 MED ORDER — FENTANYL CITRATE (PF) 100 MCG/2ML IJ SOLN
INTRAMUSCULAR | Status: DC | PRN
  Administered 2019-10-27: 25 ug via INTRAVENOUS
  Administered 2019-10-27: 50 ug via INTRAVENOUS

## 2019-10-27 MED ORDER — PRASUGREL HCL 10 MG PO TABS
ORAL_TABLET | ORAL | Status: AC
  Filled 2019-10-27: qty 6

## 2019-10-27 MED ORDER — LOSARTAN POTASSIUM 50 MG PO TABS
25.0000 mg | ORAL_TABLET | Freq: Every day | ORAL | Status: DC
  Administered 2019-10-27: 25 mg via ORAL
  Filled 2019-10-27: qty 1

## 2019-10-27 MED ORDER — SODIUM CHLORIDE 0.9% FLUSH: 3.0000 mL | INTRAVENOUS | Status: DC | PRN

## 2019-10-27 SURGICAL SUPPLY — 15 items
BALLN EUPHORA RX 2.5X12 (BALLOONS) ×2
BALLN ~~LOC~~ EUPHORA RX 3.25X20 (BALLOONS) ×2
BALLOON EUPHORA RX 2.5X12 (BALLOONS) ×1 IMPLANT
BALLOON ~~LOC~~ EUPHORA RX 3.25X20 (BALLOONS) ×1 IMPLANT
CATH INFINITI 5FR ANG PIGTAIL (CATHETERS) ×2 IMPLANT
CATH LAUNCHER 6FR EBU3.5 (CATHETERS) ×2 IMPLANT
DEVICE INFLAT 30 PLUS (MISCELLANEOUS) ×2 IMPLANT
DEVICE RAD COMP TR BAND LRG (VASCULAR PRODUCTS) ×2 IMPLANT
GLIDESHEATH SLEND SS 6F .021 (SHEATH) ×2 IMPLANT
GUIDEWIRE INQWIRE 1.5J.035X260 (WIRE) ×2 IMPLANT
INQWIRE 1.5J .035X260CM (WIRE) ×4
KIT MANI 3VAL PERCEP (MISCELLANEOUS) ×2 IMPLANT
PACK CARDIAC CATH (CUSTOM PROCEDURE TRAY) ×2 IMPLANT
STENT RESOLUTE ONYX 2.75X30 (Permanent Stent) ×2 IMPLANT
WIRE RUNTHROUGH .014X180CM (WIRE) ×2 IMPLANT

## 2019-10-27 NOTE — Consult Note (Signed)
[place] for Heparin Indication: chest pain/ACS  No Known Allergies  Patient Measurements:   Heparin Dosing Weight: 95.8 kg   Vital Signs: Pulse Rate: 102 (04/10 1405)  Labs: No results for input(s): HGB, HCT, PLT, APTT, LABPROT, INR, HEPARINUNFRC, HEPRLOWMOCWT, CREATININE, CKTOTAL, CKMB, TROPONINIHS in the last 72 hours.  CrCl cannot be calculated (Patient's most recent lab result is older than the maximum 21 days allowed.).   Medications:  Per chart review, no anticoagulant prior to admission. Pharmacy called to verify anticoagulant hx and was informed patient was in the cath-lab.   Assessment: Pharmacy has been consulted for heparin dosing. Baseline CBC appropriate and will need to order other labs. Heparin 4000 units was already given to the patient just prior to consult; therefore, no bolus needed at this time.   Goal of Therapy:  Heparin level 0.3-0.7 units/ml Monitor platelets by anticoagulation protocol: Yes   Plan:  Baseline labs have been ordered  Heparin DW: 95.8 kg Start heparin infusion at 1150 units/hr Check anti-Xa level in 6 hours and daily while on heparin, per protocol Continue to monitor H&H and platelets   Judi Jaffe R Tehya Leath 10/27/2019,2:18 PM

## 2019-10-27 NOTE — ED Provider Notes (Signed)
[facility] Emergency Department Provider Note    First MD Initiated Contact with Patient 10/27/19 1404     (approximate)  I have reviewed the triage vital signs and the nursing notes.   HISTORY  Chief Complaint Code STEMI    HPI [name] is a 59 y.o. male below listed past medical history presents to the ER for evaluation of midsternal chest pain and pressure started around 11:00 this morning as the patient was mowing his lawn.  Does smoke has a history of high blood pressure as well as diabetes.  To become very diaphoretic with it.  States the pain has somewhat eased but is still there.  Denies any history of cardiac illness     Past Medical History:  Diagnosis Date  . Anemia 10/03/2009   Qualifier: Diagnosis of  By: [name]   . Anxiety associated with depression 10/11/2013  . Colon cancer ([place])    right colon cancer- adenocarcinoma CEA level isnrmal at 1.8  . Diabetes mellitus   . Diabetes mellitus type 2 in obese [facility]) 03/20/2010   Qualifier: Diagnosis of  By: [name]    . Erectile dysfunction 06/22/2016  . Essential hypertension 06/05/2014  . FATTY LIVER DISEASE 11/04/2009   Qualifier: Diagnosis of  By: [name] MD, Docia Chuck   Qualifier: Diagnosis of  By: [name] MD, Docia Chuck  Last Assessment & Plan:  conirmed by CT scan of abdomen in April 2016 encouraged to minimize simple carbs and fatty foods.  Marland Kitchen GERD 11/04/2009   Qualifier: Diagnosis of  By: [name] MD, Docia Chuck   . Great toe pain, right 04/18/2017  . Hepatic artery stenosis ([place])   . History of colon cancer 01/02/2010   Qualifier: Diagnosis of  By: [name] Dr [name]  Last Assessment & Plan:  Follows closely with gastroenterology, Dr [name]  . Hyperlipidemia   . Hyperlipidemia, mixed 09/07/2010   Qualifier: Diagnosis of  By: [name]   . Hypertension   . Hypertriglyceridemia 12/14/2010  . Hypotestosteronism 06/22/2016  . INSOMNIA, CHRONIC 03/20/2010   Qualifier:  Diagnosis of  By: [name] Ambien 10 mg daily does not keep asleep AdvilPM is over sedating and has trouble waking up   . Iron deficiency anemia 2011  . Low testosterone 06/22/2016  . Malignant neoplasm of colon ([place]) 01/02/2010   Qualifier: Diagnosis of  By: [name] Dr [name]   . Muscle spasm of back 06/05/2014  . Nipple pain 09/30/2016  . Obesity   . Palpitations 06/05/2014  . Preventative health care 10/08/2012  . Psoriasis   . Psoriatic arthritis ([place]) 08/25/2017  . Right knee pain 11/17/2011   Family History  Problem Relation Age of Onset  . Stomach cancer Mother        diedinher 71's  . Diabetes type II Brother        boderline  . Diabetes Brother        type II  . Alcohol abuse Brother   . Other Neg Hx        cad,prostate ca, colon ca  . Coronary artery disease Neg Hx   . Cancer Neg Hx        colon, prostate  . Colon cancer Neg Hx   . Esophageal cancer Neg Hx   . Rectal cancer Neg Hx    Past Surgical History:  Procedure Laterality Date  . CARPAL TUNNEL RELEASE  RIGHT  . CHOLECYSTECTOMY  1994  . COLONOSCOPY    . HEMICOLECTOMY     12/17/2009 right  . POLYPECTOMY     Patient Active Problem List   Diagnosis Date Noted  . Acute ST elevation myocardial infarction (STEMI) of anterior wall ([place]) 10/27/2019  . Right-sided Bell's palsy 11/15/2018  . IBS (irritable bowel syndrome) 08/25/2018  . Chest pain in adult 02/05/2018  . Psoriatic arthritis ([place]) 08/25/2017  . Great toe pain, right 04/18/2017  . Nipple pain 09/30/2016  . Hypotestosteronism 06/22/2016  . Erectile dysfunction 06/22/2016  . Essential hypertension 06/05/2014  . Palpitation 06/05/2014  . Muscle spasm of back 06/05/2014  . Depression with anxiety 10/11/2013  . Preventative health care 10/08/2012  . Right knee pain 11/17/2011  . Hypertriglyceridemia 12/14/2010  . Hyperlipidemia, mixed 09/07/2010  . INSOMNIA, CHRONIC 03/20/2010  . Diabetes mellitus type 2 in obese ([place]) 03/20/2010    . Malignant neoplasm of colon ([place]) 01/02/2010  . History of colon cancer 01/02/2010  . GERD 11/04/2009  . FATTY LIVER DISEASE 11/04/2009  . Obesity 10/03/2009  . Anemia 10/03/2009  . Psoriasis 10/03/2009      Prior to Admission medications   Medication Sig Start Date End Date Taking? Authorizing Provider  atorvastatin (LIPITOR) 20 MG tablet Take 1 tablet (20 mg total) by mouth daily. Patient not taking: Reported on 01/01/2019 11/14/18   Mosie Lukes, MD  Blood Glucose Monitoring Suppl (ONETOUCH VERIO) W/DEVICE KIT  10/30/14   [provider]  clobetasol cream (TEMOVATE) 0.05 %  07/28/17   [provider]  diazepam (VALIUM) 5 MG tablet TAKE 1 TABLET(5 MG) BY MOUTH AT BEDTIME AS NEEDED FOR ANXIETY 06/28/19   Mosie Lukes, MD  escitalopram (LEXAPRO) 10 MG tablet Take 1 tablet (10 mg total) by mouth daily. Patient not taking: Reported on 01/01/2019 11/14/18   Mosie Lukes, MD  fenofibrate 160 MG tablet Take 1 tablet (160 mg total) by mouth daily. Patient not taking: Reported on 01/01/2019 11/14/18   Mosie Lukes, MD  folic acid (FOLVITE) 1 MG tablet Take 1 tablet (1 mg total) by mouth daily. 09/20/19   Mosie Lukes, MD  glucose blood [facility] VERIO) test strip Test three times daily, DX E11.9 12/05/14   Mosie Lukes, MD  HYDROcodone-acetaminophen (NORCO) 5-325 MG tablet Take 1 tablet by mouth every 4 (four) hours as needed for moderate pain or severe pain. Patient not taking: Reported on 01/01/2019 05/04/18   Mosie Lukes, MD  Insulin Glargine [facility]) 100 UNIT/ML SOPN 60-80 units daily at 10pm 11/23/18   Mosie Lukes, MD  insulin lispro (HUMALOG KWIKPEN) 100 UNIT/ML KwikPen 5-20 Units TID with meals 11/23/18   Mosie Lukes, MD  Insulin Pen Needle (B-D ULTRAFINE III SHORT PEN) 31G X 8 MM MISC Test as directed three times daily.  DX E11.9 11/14/18   Mosie Lukes, MD  lisinopril (ZESTRIL) 5 MG tablet Take 1 tablet (5 mg total) by mouth  daily. Patient not taking: Reported on 01/01/2019 11/14/18   Mosie Lukes, MD  losartan (COZAAR) 50 MG tablet TAKE 1 TABLET BY MOUTH EVERY DAY Patient not taking: Reported on 01/01/2019 11/14/18   Mosie Lukes, MD  meloxicam (MOBIC) 15 MG tablet Take 15 mg by mouth daily.  07/28/17   [provider]  metFORMIN (GLUCOPHAGE) 1000 MG tablet TAKE 1 TABLET (1,000 MG TOTAL) BY MOUTH 2 (TWO) TIMES DAILY WITH A MEAL. 11/14/18   Mosie Lukes,  MD  methotrexate (RHEUMATREX) 2.5 MG tablet Take 4 tablets (10 mg total) by mouth once a week. Caution:Chemotherapy. Protect from light. 09/20/19   Mosie Lukes, MD  methylPREDNISolone (MEDROL) 4 MG tablet 5 tab po qd X 1d then 4 tab po qd X 1d then 3 tab po qd X 1d then 2 tab po qd then 1 tab po qd 06/28/19   Mosie Lukes, MD  metoprolol succinate (TOPROL-XL) 25 MG 24 hr tablet Take 1 tablet (25 mg total) by mouth daily. 06/28/19   Mosie Lukes, MD  mupirocin ointment (BACTROBAN) 2 % Place 1 application into the nose 2 (two) times daily. 06/28/19   Mosie Lukes, MD  omeprazole (PRILOSEC) 40 MG capsule Take 1 capsule (40 mg total) by mouth 2 (two) times daily as needed. Patient not taking: Reported on 01/01/2019 11/14/18   Mosie Lukes, MD  Jonetta Speak LANCETS [place]  [birth year]   [provider]  Polyethyl Glycol-Propyl Glycol (SYSTANE) 0.4-0.3 % SOLN Apply 2 drops to eye as needed. Every hour while awake Patient not taking: Reported on 01/01/2019 10/13/18   Melynda Ripple, MD  Secukinumab (COSENTYX 300 DOSE) 150 MG/ML SOSY Inject 300 mg into the skin once monthly. 11/23/17   [provider]  zolpidem (AMBIEN) 10 MG tablet TAKE 1 TABLET(10 MG) BY MOUTH AT BEDTIME AS NEEDED 06/11/19   Mosie Lukes, MD    Allergies Patient has no known allergies.    Social History Social History   Tobacco Use  . Smoking status: Current Some Day Smoker    Types: Cigarettes  . Smokeless tobacco: Never Used  Substance Use Topics  .  Alcohol use: No  . Drug use: No    Review of Systems Patient denies headaches, rhinorrhea, blurry vision, numbness, shortness of breath, chest pain, edema, cough, abdominal pain, nausea, vomiting, diarrhea, dysuria, fevers, rashes or hallucinations unless otherwise stated above in HPI. ____________________________________________   PHYSICAL EXAM:  VITAL SIGNS: Vitals:   10/27/19 1405  BP: 110/67  Pulse: (!) 102  Resp: (!) 24  SpO2: 98%    Constitutional: Alert and oriented.  Eyes: Conjunctivae are normal.  Head: Atraumatic. Nose: No congestion/rhinnorhea. Mouth/Throat: Mucous membranes are moist.   Neck: No stridor. Painless ROM.  Cardiovascular: Normal rate, regular rhythm. Grossly normal heart sounds.  Good peripheral circulation. Respiratory: Normal respiratory effort.  No retractions. Lungs CTAB. Gastrointestinal: Soft and nontender. No distention. No abdominal bruits. No CVA tenderness. Genitourinary:  Musculoskeletal: No lower extremity tenderness nor edema.  No joint effusions. Neurologic:  Normal speech and language. No gross focal neurologic deficits are appreciated. No facial droop Skin:  Skin is warm, dry and intact. No rash noted. Psychiatric: Mood and affect are normal. Speech and behavior are normal.  ____________________________________________   LABS (all labs ordered are listed, but only abnormal results are displayed)  Results for orders placed or performed during the hospital encounter of 10/27/19 (from the past 24 hour(s))  Respiratory Panel by RT PCR (Flu A&B, Covid) - Nasopharyngeal Swab     Status: None   Collection Time: 10/27/19  2:06 PM   Specimen: Nasopharyngeal Swab  Result Value Ref Range   SARS Coronavirus 2 by RT PCR NEGATIVE NEGATIVE   Influenza A by PCR NEGATIVE NEGATIVE   Influenza B by PCR NEGATIVE NEGATIVE  CBC with Differential/Platelet     Status: Abnormal   Collection Time: 10/27/19  2:06 PM  Result Value Ref Range   WBC 17.4  (H) 4.0 -  10.5 K/uL   RBC 4.55 4.22 - 5.81 MIL/uL   Hemoglobin 14.8 13.0 - 17.0 g/dL   HCT 42.7 39.0 - 52.0 %   MCV 93.8 80.0 - 100.0 fL   MCH 32.5 26.0 - 34.0 pg   MCHC 34.7 30.0 - 36.0 g/dL   RDW 13.3 11.5 - 15.5 %   Platelets 433 (H) 150 - 400 K/uL   nRBC 0.0 0.0 - 0.2 %   Neutrophils Relative % 79 %   Neutro Abs 13.8 (H) 1.7 - 7.7 K/uL   Lymphocytes Relative 12 %   Lymphs Abs 2.1 0.7 - 4.0 K/uL   Monocytes Relative 7 %   Monocytes Absolute 1.2 (H) 0.1 - 1.0 K/uL   Eosinophils Relative 0 %   Eosinophils Absolute 0.1 0.0 - 0.5 K/uL   Basophils Relative 1 %   Basophils Absolute 0.1 0.0 - 0.1 K/uL   Immature Granulocytes 1 %   Abs Immature Granulocytes 0.13 (H) 0.00 - 0.07 K/uL  Protime-INR     Status: None   Collection Time: 10/27/19  2:06 PM  Result Value Ref Range   Prothrombin Time 12.8 11.4 - 15.2 seconds   INR 1.0 0.8 - 1.2  APTT     Status: None   Collection Time: 10/27/19  2:06 PM  Result Value Ref Range   aPTT 26 24 - 36 seconds  Comprehensive metabolic panel     Status: Abnormal   Collection Time: 10/27/19  2:06 PM  Result Value Ref Range   Sodium 137 135 - 145 mmol/L   Potassium 3.5 3.5 - 5.1 mmol/L   Chloride 105 98 - 111 mmol/L   CO2 21 (L) 22 - 32 mmol/L   Glucose, Bld 273 (H) 70 - 99 mg/dL   BUN 8 6 - 20 mg/dL   Creatinine, Ser 0.70 0.61 - 1.24 mg/dL   Calcium 8.7 (L) 8.9 - 10.3 mg/dL   Total Protein 7.5 6.5 - 8.1 g/dL   Albumin 3.9 3.5 - 5.0 g/dL   AST 27 15 - 41 U/L   ALT 18 0 - 44 U/L   Alkaline Phosphatase 75 38 - 126 U/L   Total Bilirubin 1.6 (H) 0.3 - 1.2 mg/dL   GFR calc non Af Amer >60 >60 mL/min   GFR calc Af Amer >60 >60 mL/min   Anion gap 11 5 - 15  Troponin I (High Sensitivity)     Status: Abnormal   Collection Time: 10/27/19  2:06 PM  Result Value Ref Range   Troponin I (High Sensitivity) 783 (HH) <18 ng/L  Lipid panel     Status: Abnormal   Collection Time: 10/27/19  2:06 PM  Result Value Ref Range   Cholesterol 221 (H) 0 - 200  mg/dL   Triglycerides 453 (H) <150 mg/dL   HDL 31 (L) >40 mg/dL   Total CHOL/HDL Ratio 7.1 RATIO   VLDL UNABLE TO CALCULATE IF TRIGLYCERIDE OVER 400 mg/dL 0 - 40 mg/dL   LDL Cholesterol UNABLE TO CALCULATE IF TRIGLYCERIDE OVER 400 mg/dL 0 - 99 mg/dL  POCT Activated clotting time     Status: None   Collection Time: 10/27/19  2:44 PM  Result Value Ref Range   Activated Clotting Time 318 seconds   ____________________________________________  EKG My review and personal interpretation at Time: 14:05 Indication: chest pain  Rate: 100  Rhythm: sinus Axis: normal Other: anterolateral stemi ____________________________________________  RADIOLOGY  I personally reviewed all radiographic images ordered to evaluate for the above acute  complaints and reviewed radiology reports and findings.  These findings were personally discussed with the patient.  Please see medical record for radiology report.  ____________________________________________   PROCEDURES  Procedure(s) performed:  .Critical Care Performed by: Merlyn Lot, MD Authorized by: Merlyn Lot, MD   Critical care provider statement:    Critical care time (minutes):  15   Critical care time was exclusive of:  Separately billable procedures and treating other patients   Critical care was necessary to treat or prevent imminent or life-threatening deterioration of the following conditions:  Cardiac failure   Critical care was time spent personally by me on the following activities:  Development of treatment plan with patient or surrogate, discussions with consultants, evaluation of patient's response to treatment, examination of patient, obtaining history from patient or surrogate, ordering and performing treatments and interventions, ordering and review of laboratory studies, ordering and review of radiographic studies, pulse oximetry, re-evaluation of patient's condition and review of old charts      Critical Care  performed: yes ____________________________________________   INITIAL IMPRESSION / [place] / ED COURSE  Pertinent labs & imaging results that were available during my care of the patient were reviewed by me and considered in my medical decision making (see chart for details).   DDX: acs, stemi, dissection, chf, electrolyte abn  [name] is a 59 y.o. who presents to the ED with acute chest pain and evidence of STEMI by EKG via EMS.  Arrives protecting his airway.  Critically ill evaluated immediately at bedside by me as well as Dr. Fletcher Anon of cardiology.  Patient taken emergently to Cath Lab.     The patient was evaluated in Emergency Department today for the symptoms described in the history of present illness. He/she was evaluated in the context of the global COVID-19 pandemic, which necessitated consideration that the patient might be at risk for infection with the SARS-CoV-2 virus that causes COVID-19. Institutional protocols and algorithms that pertain to the evaluation of patients at risk for COVID-19 are in a state of rapid change based on information released by regulatory bodies including the CDC and federal and state organizations. These policies and algorithms were followed during the patient's care in the ED.  As part of my medical decision making, I reviewed the following data within the [facility] notes reviewed and incorporated, Labs reviewed, notes from prior ED visits and [place] Controlled Substance Database   ____________________________________________   FINAL CLINICAL IMPRESSION(S) / ED DIAGNOSES  Final diagnoses:  STEMI      NEW MEDICATIONS STARTED DURING THIS VISIT:  Current Discharge Medication List       Note:  This document was prepared using Dragon voice recognition software and may include unintentional dictation errors.    Merlyn Lot, MD 10/27/19 [phone]

## 2019-10-27 NOTE — ED Triage Notes (Addendum)
Pt arrives via EMS from home after having chest pain since 11:30- pt showed elevation in leads 3,4, and 5- pt got 2 sprays of nitro- cbg 300+- pt given ASA by EMS

## 2019-10-27 NOTE — ED Notes (Signed)
Pt transported to cath lab.  

## 2019-10-27 NOTE — Consult Note (Signed)
Cardiology Consultation:   Patient ID: [name] MRN: [id number]; DOB: 05-14-1961  Admit date: 10/27/2019 Date of Consult: 10/27/2019  Primary Care Provider: Mosie Lukes, MD Primary Cardiologist: new Fletcher Anon) Primary Electrophysiologist:  None    Patient Profile:   [name] is a 59 y.o. male with a hx of diabetes mellitus and tobacco use who is being seen today for the evaluation of anterior ST elevation myocardial infarction at the request of Dr. Quentin Cornwall.  History of Present Illness:   [name] is a 59 year old male with no prior cardiac history.  He has extensive medical problems including prolonged history of diabetes mellitus requiring insulin, tobacco use, hyperlipidemia, essential hypertension and colon cancer status post resection.  In addition, he has known history of anxiety and depression.  He was working in his yard today and was trying to move the lawnmower when he started having severe substernal chest pain and tightness with associated shortness of breath, nausea and an episode of vomiting.  He had significant diaphoresis.  He called EMS.  EKG showed anterior ST elevation and thus a code STEMI was activated from the field.  I saw the patient in the ED and he was still having 9 out of 10 chest pain and appeared very uncomfortable with shortness of breath and intermittent coughing.  Repeat EKG confirmed the findings.  He was given 4000 units of unfractionated heparin and aspirin.  I recommended proceeding with emergent cardiac catheterization and possible PCI.   Past Medical History:  Diagnosis Date  . Anemia 10/03/2009   Qualifier: Diagnosis of  By: [name]   . Anxiety associated with depression 10/11/2013  . Colon cancer ([place])    right colon cancer- adenocarcinoma CEA level isnrmal at 1.8  . Diabetes mellitus   . Diabetes mellitus type 2 in obese [facility]) 03/20/2010   Qualifier: Diagnosis of  By: [name]    . Erectile dysfunction 06/22/2016    . Essential hypertension 06/05/2014  . FATTY LIVER DISEASE 11/04/2009   Qualifier: Diagnosis of  By: [name] MD, Docia Chuck   Qualifier: Diagnosis of  By: [name] MD, Docia Chuck  Last Assessment & Plan:  conirmed by CT scan of abdomen in April 2016 encouraged to minimize simple carbs and fatty foods.  Marland Kitchen GERD 11/04/2009   Qualifier: Diagnosis of  By: [name] MD, Docia Chuck   . Great toe pain, right 04/18/2017  . Hepatic artery stenosis ([place])   . History of colon cancer 01/02/2010   Qualifier: Diagnosis of  By: [name] Dr [name]  Last Assessment & Plan:  Follows closely with gastroenterology, Dr [name]  . Hyperlipidemia   . Hyperlipidemia, mixed 09/07/2010   Qualifier: Diagnosis of  By: [name]   . Hypertension   . Hypertriglyceridemia 12/14/2010  . Hypotestosteronism 06/22/2016  . INSOMNIA, CHRONIC 03/20/2010   Qualifier: Diagnosis of  By: [name] Ambien 10 mg daily does not keep asleep AdvilPM is over sedating and has trouble waking up   . Iron deficiency anemia 2011  . Low testosterone 06/22/2016  . Malignant neoplasm of colon ([place]) 01/02/2010   Qualifier: Diagnosis of  By: [name] Dr [name]   . Muscle spasm of back 06/05/2014  . Nipple pain 09/30/2016  . Obesity   . Palpitations 06/05/2014  . Preventative health care 10/08/2012  . Psoriasis   . Psoriatic arthritis ([place]) 08/25/2017  . Right knee pain 11/17/2011    Past  Surgical History:  Procedure Laterality Date  . CARPAL TUNNEL RELEASE     RIGHT  . CHOLECYSTECTOMY  1994  . COLONOSCOPY    . HEMICOLECTOMY     12/17/2009 right  . POLYPECTOMY       Home Medications:  Prior to Admission medications   Medication Sig Start Date End Date Taking? Authorizing Provider  atorvastatin (LIPITOR) 20 MG tablet Take 1 tablet (20 mg total) by mouth daily. Patient not taking: Reported on 01/01/2019 11/14/18   Mosie Lukes, MD  Blood Glucose Monitoring Suppl (ONETOUCH VERIO) W/DEVICE KIT  10/30/14   [provider]   clobetasol cream (TEMOVATE) 0.05 %  07/28/17   [provider]  diazepam (VALIUM) 5 MG tablet TAKE 1 TABLET(5 MG) BY MOUTH AT BEDTIME AS NEEDED FOR ANXIETY 06/28/19   Mosie Lukes, MD  escitalopram (LEXAPRO) 10 MG tablet Take 1 tablet (10 mg total) by mouth daily. Patient not taking: Reported on 01/01/2019 11/14/18   Mosie Lukes, MD  fenofibrate 160 MG tablet Take 1 tablet (160 mg total) by mouth daily. Patient not taking: Reported on 01/01/2019 11/14/18   Mosie Lukes, MD  folic acid (FOLVITE) 1 MG tablet Take 1 tablet (1 mg total) by mouth daily. 09/20/19   Mosie Lukes, MD  glucose blood [facility] VERIO) test strip Test three times daily, DX E11.9 12/05/14   Mosie Lukes, MD  HYDROcodone-acetaminophen (NORCO) 5-325 MG tablet Take 1 tablet by mouth every 4 (four) hours as needed for moderate pain or severe pain. Patient not taking: Reported on 01/01/2019 05/04/18   Mosie Lukes, MD  Insulin Glargine [facility]) 100 UNIT/ML SOPN 60-80 units daily at 10pm 11/23/18   Mosie Lukes, MD  insulin lispro (HUMALOG KWIKPEN) 100 UNIT/ML KwikPen 5-20 Units TID with meals 11/23/18   Mosie Lukes, MD  Insulin Pen Needle (B-D ULTRAFINE III SHORT PEN) 31G X 8 MM MISC Test as directed three times daily.  DX E11.9 11/14/18   Mosie Lukes, MD  lisinopril (ZESTRIL) 5 MG tablet Take 1 tablet (5 mg total) by mouth daily. Patient not taking: Reported on 01/01/2019 11/14/18   Mosie Lukes, MD  losartan (COZAAR) 50 MG tablet TAKE 1 TABLET BY MOUTH EVERY DAY Patient not taking: Reported on 01/01/2019 11/14/18   Mosie Lukes, MD  meloxicam (MOBIC) 15 MG tablet Take 15 mg by mouth daily.  07/28/17   [provider]  metFORMIN (GLUCOPHAGE) 1000 MG tablet TAKE 1 TABLET (1,000 MG TOTAL) BY MOUTH 2 (TWO) TIMES DAILY WITH A MEAL. 11/14/18   Mosie Lukes, MD  methotrexate (RHEUMATREX) 2.5 MG tablet Take 4 tablets (10 mg total) by mouth once a week. Caution:Chemotherapy. Protect from  light. 09/20/19   Mosie Lukes, MD  methylPREDNISolone (MEDROL) 4 MG tablet 5 tab po qd X 1d then 4 tab po qd X 1d then 3 tab po qd X 1d then 2 tab po qd then 1 tab po qd 06/28/19   Mosie Lukes, MD  metoprolol succinate (TOPROL-XL) 25 MG 24 hr tablet Take 1 tablet (25 mg total) by mouth daily. 06/28/19   Mosie Lukes, MD  mupirocin ointment (BACTROBAN) 2 % Place 1 application into the nose 2 (two) times daily. 06/28/19   Mosie Lukes, MD  omeprazole (PRILOSEC) 40 MG capsule Take 1 capsule (40 mg total) by mouth 2 (two) times daily as needed. Patient not taking: Reported on 01/01/2019 11/14/18   Charlett Blake,  Bonnita Levan, MD  [place] LANCETS [place]  2/[birth year]/62   [provider]  Polyethyl Glycol-Propyl Glycol (SYSTANE) 0.4-0.3 % SOLN Apply 2 drops to eye as needed. Every hour while awake Patient not taking: Reported on 01/01/2019 10/13/18   Melynda Ripple, MD  Secukinumab (COSENTYX 300 DOSE) 150 MG/ML SOSY Inject 300 mg into the skin once monthly. 11/23/17   [provider]  zolpidem (AMBIEN) 10 MG tablet TAKE 1 TABLET(10 MG) BY MOUTH AT BEDTIME AS NEEDED 06/11/19   Mosie Lukes, MD    Inpatient Medications: Scheduled Meds: . aspirin  324 mg Oral Once  . aspirin  81 mg Oral Daily  . atorvastatin  80 mg Oral q1800  . carvedilol  3.125 mg Oral BID WC  . Chlorhexidine Gluconate Cloth  6 each Topical Daily  . [START ON 10/28/2019] enoxaparin (LOVENOX) injection  40 mg Subcutaneous Q24H  . escitalopram  10 mg Oral Daily  . losartan  25 mg Oral Daily  . pantoprazole  40 mg Oral Daily  . prasugrel  10 mg Oral Daily  . sodium chloride flush  3 mL Intravenous Q12H   Continuous Infusions: . sodium chloride    . sodium chloride     PRN Meds: sodium chloride, acetaminophen, diazepam, ondansetron (ZOFRAN) IV, sodium chloride flush  Allergies:   No Known Allergies  Social History:   Social History   Socioeconomic History  . Marital status: Single    Spouse name: [name]    . Number of children: 0  . Years of education: Not on file  . Highest education level: Not on file  Occupational History  . Occupation: Merchant navy officer    Comment: Recruitment consultant  Tobacco Use  . Smoking status: Current Some Day Smoker    Types: Cigarettes  . Smokeless tobacco: Never Used  Substance and Sexual Activity  . Alcohol use: No  . Drug use: No  . Sexual activity: Yes    Comment: lives with girlfriend, no dietary restrictions  Other Topics Concern  . Not on file  Social History Narrative   Occupation: Theatre stage manager and fax)   Single  (lives with [name])   no children    former smoker   Illicit Drug Use - no    Social Determinants of Radio broadcast assistant Strain:   . Difficulty of Paying Living Expenses:   Food Insecurity:   . Worried About Charity fundraiser in the Last Year:   . Arboriculturist in the Last Year:   Transportation Needs:   . Film/video editor (Medical):   Marland Kitchen Lack of Transportation (Non-Medical):   Physical Activity:   . Days of Exercise per Week:   . Minutes of Exercise per Session:   Stress:   . Feeling of Stress :   Social Connections:   . Frequency of Communication with Friends and Family:   . Frequency of Social Gatherings with Friends and Family:   . Attends Religious Services:   . Active Member of Clubs or Organizations:   . Attends Archivist Meetings:   Marland Kitchen Marital Status:   Intimate Partner Violence:   . Fear of Current or Ex-Partner:   . Emotionally Abused:   Marland Kitchen Physically Abused:   . Sexually Abused:     Family History:    Family History  Problem Relation Age of Onset  . Stomach cancer Mother        diedinher 46's  . Diabetes type II Brother  boderline  . Diabetes Brother        type II  . Alcohol abuse Brother   . Other Neg Hx        cad,prostate ca, colon ca  . Coronary artery disease Neg Hx   . Cancer Neg Hx        colon, prostate  . Colon cancer Neg Hx   . Esophageal cancer Neg Hx    . Rectal cancer Neg Hx      ROS:  Please see the history of present illness.   All other ROS reviewed and negative.     Physical Exam/Data:   Vitals:   10/27/19 1405 10/27/19 1418 10/27/19 1540  BP: 110/67  (!) 155/94  Pulse: (!) 102  (!) 103  Resp: (!) 24  18  Temp:   97.9 F (36.6 C)  TempSrc:   Oral  SpO2: 98%  95%  Weight:  95.8 kg 99 kg  Height:   _0  (1.702 m)   No intake or output data in the 24 hours ending 10/27/19 1602 Last 3 Weights 10/27/2019 10/27/2019 06/28/2019  Weight (lbs) 218 lb 4.1 oz 211 lb 3.2 oz 220 lb 6.4 oz  Weight (kg) 99 kg 95.8 kg 99.973 kg     Body mass index is 34.18 kg/m.  General:  Well nourished, well developed, in no acute distress HEENT: normal Lymph: no adenopathy Neck: no JVD Endocrine:  No thryomegaly Vascular: No carotid bruits; FA pulses 2+ bilaterally without bruits  Cardiac:  normal S1, S2; RRR; no murmur  Lungs:  clear to auscultation bilaterally, no wheezing, rhonchi or rales  Abd: soft, nontender, no hepatomegaly  Ext: no edema Musculoskeletal:  No deformities, BUE and BLE strength normal and equal Skin: warm and dry  Neuro:  CNs 2-12 intact, no focal abnormalities noted Psych:  Normal affect   EKG:  The EKG was personally reviewed and demonstrates: Normal sinus rhythm with anterior ST elevation with reciprocal changes in the inferior leads Telemetry:  Telemetry was personally reviewed and demonstrates:    Relevant CV Studies:   Laboratory Data:  High Sensitivity Troponin:   Recent Labs  Lab 10/27/19 1406  TROPONINIHS 783*     Chemistry Recent Labs  Lab 10/27/19 1406  NA 137  K 3.5  CL 105  CO2 21*  GLUCOSE 273*  BUN 8  CREATININE 0.70  CALCIUM 8.7*  GFRNONAA >60  GFRAA >60  ANIONGAP 11    Recent Labs  Lab 10/27/19 1406  PROT 7.5  ALBUMIN 3.9  AST 27  ALT 18  ALKPHOS 75  BILITOT 1.6*   Hematology Recent Labs  Lab 10/27/19 1406  WBC 17.4*  RBC 4.55  HGB 14.8  HCT 42.7  MCV 93.8   MCH 32.5  MCHC 34.7  RDW 13.3  PLT 433*   BNPNo results for input(s): BNP, PROBNP in the last 168 hours.  DDimer No results for input(s): DDIMER in the last 168 hours.   Radiology/Studies:  CARDIAC CATHETERIZATION  Result Date: 10/27/2019  A drug-eluting stent was successfully placed using a STENT RESOLUTE ONYX 2.75X30.  Prox LAD to Mid LAD lesion is 100% stenosed.  Post intervention, there is a 0% residual stenosis.  1st Diag-1 lesion is 90% stenosed.  1st Diag-2 lesion is 80% stenosed.  1.  Codominant coronary arteries with thrombotic occlusion of the proximal LAD which is the culprit for anterior ST elevation myocardial infarction.  No other obstructive disease. 2.  Left ventricular angiography was not performed  due to severely elevated left ventricular end-diastolic pressure at 34 mmHg 3.  Successful angioplasty and drug-eluting stent placement to the proximal LAD. Recommendations: We will use Aggrastat infusion for 18 hours due to residual thrombus in first diagonal. Dual antiplatelet therapy for at least 1 year. Aggressive treatment of risk factors. The patient was given 1 dose of IV furosemide 40 mg once daily before he left the Cath Lab.  I do not expect him to require more diuresis but can be followed clinically. I ordered an echocardiogram.    TIMI Risk Score for ST  Elevation MI:   The patient's TIMI risk score is 4, which indicates a 7.3% risk of all cause mortality at 30 days.    Assessment and Plan:   1. Acute anterior ST elevation myocardial infarction: Emergent cardiac catheterization was performed via the right radial artery which showed an occluded proximal LAD.  This was treated successfully with PCI and drug-eluting stent placement.  There was residual nonocclusive thrombus in the first diagonal branch and the patient was started on Aggrastat infusion which will be continued for 18 hours.  Continue dual antiplatelet therapy with aspirin and prasugrel for at least 12  months.  Recommend aggressive treatment of risk factors. 2. Acute systolic heart failure due to anterior myocardial infarction.  Left ventricular angiography was not performed due to significantly elevated LVEDP.  An echocardiogram was ordered.  I suspect that his ejection fraction will be at least moderately decreased.  I started him on small dose losartan and carvedilol.  He was given 1 dose of IV furosemide 40 mg before he left the Cath Lab.  Recommend eplerenone or spironolactone before hospital discharge if blood pressure allows. 3. Tobacco use: He will require counseling regarding smoking cessation. 4. Hyperlipidemia: I increase his atorvastatin to 80 mg daily. 5. Diabetes mellitus: Management per hospitalist service.  Disposition: If the patient remains stable today, he can be transferred to telemetry tomorrow with possible discharge home Monday or Tuesday.  For questions or updates, please contact [place] Please consult [email or web address] for contact info under     Signed, Kathlyn Sacramento, MD  10/27/2019 4:02 PM

## 2019-10-27 NOTE — H&P (Signed)
History and Physical    [name] GGE:[id number] DOB: 1960-09-16 DOA: 10/27/2019  PCP: Mosie Lukes, MD  Patient coming from: Home  I have personally briefly reviewed patient's old medical records in [place]  Chief Complaint: Chest pain  HPI: [name] is a 59 y.o. male with medical history significant of type 2 diabetes, hypertension, hyperlipidemia, colon cancer status post resection, psoriasis and psoriatic arthritis, insomnia presented to the ED via EMS today with chest pain.  EKG showed anterior ST elevation, patient was taken emergently to Cath Lab or single stent was placed to LAD.  Patient is post procedure and stable, but continues with 1-2 out of 10 chest pain.  He reports he was doing some yard work, had lifted up his lawnmower when the chest pain started.  States it did not let up with rest.  States that because he has no health insurance currently, he waited to see if it would let up before coming to hospital.  He reports otherwise being in relatively good health recently, no recent illnesses.  Denies fevers or chills, shortness of breath, headache, abdominal pain, nausea vomiting or diarrhea, dysuria, focal weakness or numbness tingling or other recent complaints.  ED Course: Heart rate 102, respirations 24, initial BP 110/67, O2 sat 98% on room air.  Labs were notable for hyperglycemia, initial troponin 783 and later greater than 27,000.  CBC with leukocytosis 17.4.  EKG showed ST segment elevation in anterior leads.  Review of Systems: As per HPI otherwise 10 point review of systems negative.    Past Medical History:  Diagnosis Date  . Anemia 10/03/2009   Qualifier: Diagnosis of  By: [name]   . Anxiety associated with depression 10/11/2013  . Colon cancer ([place])    right colon cancer- adenocarcinoma CEA level isnrmal at 1.8  . Diabetes mellitus   . Diabetes mellitus type 2 in obese [facility]) 03/20/2010   Qualifier: Diagnosis of  By: [name]     . Erectile dysfunction 06/22/2016  . Essential hypertension 06/05/2014  . FATTY LIVER DISEASE 11/04/2009   Qualifier: Diagnosis of  By: [name] MD, Docia Chuck   Qualifier: Diagnosis of  By: [name] MD, Docia Chuck  Last Assessment & Plan:  conirmed by CT scan of abdomen in April 2016 encouraged to minimize simple carbs and fatty foods.  Marland Kitchen GERD 11/04/2009   Qualifier: Diagnosis of  By: [name] MD, Docia Chuck   . Great toe pain, right 04/18/2017  . Hepatic artery stenosis ([place])   . History of colon cancer 01/02/2010   Qualifier: Diagnosis of  By: [name] Dr [name]  Last Assessment & Plan:  Follows closely with gastroenterology, Dr [name]  . Hyperlipidemia   . Hyperlipidemia, mixed 09/07/2010   Qualifier: Diagnosis of  By: [name]   . Hypertension   . Hypertriglyceridemia 12/14/2010  . Hypotestosteronism 06/22/2016  . INSOMNIA, CHRONIC 03/20/2010   Qualifier: Diagnosis of  By: [name] Ambien 10 mg daily does not keep asleep AdvilPM is over sedating and has trouble waking up   . Iron deficiency anemia 2011  . Low testosterone 06/22/2016  . Malignant neoplasm of colon ([place]) 01/02/2010   Qualifier: Diagnosis of  By: [name] Dr [name]   . Muscle spasm of back 06/05/2014  . Nipple pain 09/30/2016  . Obesity   . Palpitations 06/05/2014  . Preventative health care 10/08/2012  . Psoriasis   . Psoriatic  arthritis ([place]) 08/25/2017  . Right knee pain 11/17/2011    Past Surgical History:  Procedure Laterality Date  . CARPAL TUNNEL RELEASE     RIGHT  . CHOLECYSTECTOMY  1994  . COLONOSCOPY    . HEMICOLECTOMY     12/17/2009 right  . POLYPECTOMY       reports that he has been smoking cigarettes. He has never used smokeless tobacco. He reports that he does not drink alcohol or use drugs.  No Known Allergies  Family History  Problem Relation Age of Onset  . Stomach cancer Mother        diedinher 49's  . Diabetes type II Brother        boderline  . Diabetes Brother        type II   . Alcohol abuse Brother   . Other Neg Hx        cad,prostate ca, colon ca  . Coronary artery disease Neg Hx   . Cancer Neg Hx        colon, prostate  . Colon cancer Neg Hx   . Esophageal cancer Neg Hx   . Rectal cancer Neg Hx      Prior to Admission medications   Medication Sig Start Date End Date Taking? Authorizing Provider  atorvastatin (LIPITOR) 20 MG tablet Take 1 tablet (20 mg total) by mouth daily. Patient not taking: Reported on 01/01/2019 11/14/18   Mosie Lukes, MD  Blood Glucose Monitoring Suppl (ONETOUCH VERIO) W/DEVICE KIT  10/30/14   [provider]  clobetasol cream (TEMOVATE) 0.05 %  07/28/17   [provider]  diazepam (VALIUM) 5 MG tablet TAKE 1 TABLET(5 MG) BY MOUTH AT BEDTIME AS NEEDED FOR ANXIETY 06/28/19   Mosie Lukes, MD  escitalopram (LEXAPRO) 10 MG tablet Take 1 tablet (10 mg total) by mouth daily. Patient not taking: Reported on 01/01/2019 11/14/18   Mosie Lukes, MD  fenofibrate 160 MG tablet Take 1 tablet (160 mg total) by mouth daily. Patient not taking: Reported on 01/01/2019 11/14/18   Mosie Lukes, MD  folic acid (FOLVITE) 1 MG tablet Take 1 tablet (1 mg total) by mouth daily. 09/20/19   Mosie Lukes, MD  glucose blood [facility] VERIO) test strip Test three times daily, DX E11.9 12/05/14   Mosie Lukes, MD  HYDROcodone-acetaminophen (NORCO) 5-325 MG tablet Take 1 tablet by mouth every 4 (four) hours as needed for moderate pain or severe pain. Patient not taking: Reported on 01/01/2019 05/04/18   Mosie Lukes, MD  Insulin Glargine [facility]) 100 UNIT/ML SOPN 60-80 units daily at 10pm 11/23/18   Mosie Lukes, MD  insulin lispro (HUMALOG KWIKPEN) 100 UNIT/ML KwikPen 5-20 Units TID with meals 11/23/18   Mosie Lukes, MD  Insulin Pen Needle (B-D ULTRAFINE III SHORT PEN) 31G X 8 MM MISC Test as directed three times daily.  DX E11.9 11/14/18   Mosie Lukes, MD  lisinopril (ZESTRIL) 5 MG tablet Take 1 tablet (5 mg total)  by mouth daily. Patient not taking: Reported on 01/01/2019 11/14/18   Mosie Lukes, MD  losartan (COZAAR) 50 MG tablet TAKE 1 TABLET BY MOUTH EVERY DAY Patient not taking: Reported on 01/01/2019 11/14/18   Mosie Lukes, MD  meloxicam (MOBIC) 15 MG tablet Take 15 mg by mouth daily.  07/28/17   [provider]  metFORMIN (GLUCOPHAGE) 1000 MG tablet TAKE 1 TABLET (1,000 MG TOTAL) BY MOUTH 2 (TWO) TIMES DAILY WITH A  MEAL. 11/14/18   Mosie Lukes, MD  methotrexate (RHEUMATREX) 2.5 MG tablet Take 4 tablets (10 mg total) by mouth once a week. Caution:Chemotherapy. Protect from light. 09/20/19   Mosie Lukes, MD  methylPREDNISolone (MEDROL) 4 MG tablet 5 tab po qd X 1d then 4 tab po qd X 1d then 3 tab po qd X 1d then 2 tab po qd then 1 tab po qd 06/28/19   Mosie Lukes, MD  metoprolol succinate (TOPROL-XL) 25 MG 24 hr tablet Take 1 tablet (25 mg total) by mouth daily. 06/28/19   Mosie Lukes, MD  mupirocin ointment (BACTROBAN) 2 % Place 1 application into the nose 2 (two) times daily. 06/28/19   Mosie Lukes, MD  omeprazole (PRILOSEC) 40 MG capsule Take 1 capsule (40 mg total) by mouth 2 (two) times daily as needed. Patient not taking: Reported on 01/01/2019 11/14/18   Mosie Lukes, MD  Jonetta Speak LANCETS [place]  6/80/32   [provider]  Polyethyl Glycol-Propyl Glycol (SYSTANE) 0.4-0.3 % SOLN Apply 2 drops to eye as needed. Every hour while awake Patient not taking: Reported on 01/01/2019 10/13/18   Melynda Ripple, MD  Secukinumab (COSENTYX 300 DOSE) 150 MG/ML SOSY Inject 300 mg into the skin once monthly. 11/23/17   [provider]  zolpidem (AMBIEN) 10 MG tablet TAKE 1 TABLET(10 MG) BY MOUTH AT BEDTIME AS NEEDED 06/11/19   Mosie Lukes, MD    Physical Exam: Vitals:   10/27/19 1418 10/27/19 1540 10/27/19 1612 10/27/19 1700  BP:  (!) 155/94  (!) 153/96  Pulse:  (!) 103 (!) 108 (!) 108  Resp:  18 (!) 21 15  Temp:  97.9 F (36.6 C)    TempSrc:  Oral     SpO2:  95%  98%  Weight: 95.8 kg 99 kg    Height:  '5\' 7"'  (1.702 m)      Constitutional: NAD, calm, comfortable, obese Eyes: EOMI, lids and conjunctivae normal ENMT: Mucous membranes are dry. Normal dentition.  Hearing grossly normal. Respiratory: CTAB, no wheezing, no crackles. Normal respiratory effort. No accessory muscle use.  Cardiovascular: RRR, no murmurs / rubs / gallops. No extremity edema. 2+ pedal pulses.  Abdomen: soft, NT, ND, no masses or HSM palpated. +Bowel sounds.  Musculoskeletal: no clubbing / cyanosis. No joint deformity upper and lower extremities. Normal muscle tone.  Skin: dry, intact, normal color, normal temperature Neurologic: CN 2-12 grossly intact. Normal speech.  Grossly non-focal exam. Psychiatric: Alert and oriented x 3. Normal mood. Congruent affect.  Normal judgement and insight.   Labs on Admission: I have personally reviewed following labs and imaging studies  CBC: Recent Labs  Lab 10/27/19 1406  WBC 17.4*  NEUTROABS 13.8*  HGB 14.8  HCT 42.7  MCV 93.8  PLT 122*   Basic Metabolic Panel: Recent Labs  Lab 10/27/19 1406  NA 137  K 3.5  CL 105  CO2 21*  GLUCOSE 273*  BUN 8  CREATININE 0.70  CALCIUM 8.7*   GFR: Estimated Creatinine Clearance: 115.6 mL/min (by C-G formula based on SCr of 0.7 mg/dL). Liver Function Tests: Recent Labs  Lab 10/27/19 1406  AST 27  ALT 18  ALKPHOS 75  BILITOT 1.6*  PROT 7.5  ALBUMIN 3.9   No results for input(s): LIPASE, AMYLASE in the last 168 hours. No results for input(s): AMMONIA in the last 168 hours. Coagulation Profile: Recent Labs  Lab 10/27/19 1406  INR 1.0   Cardiac Enzymes: No  results for input(s): CKTOTAL, CKMB, CKMBINDEX, TROPONINI in the last 168 hours. BNP (last 3 results) No results for input(s): PROBNP in the last 8760 hours. HbA1C: No results for input(s): HGBA1C in the last 72 hours. CBG: Recent Labs  Lab 10/27/19 1540  GLUCAP 261*   Lipid Profile: Recent Labs     10/27/19 1406  CHOL 221*  HDL 31*  LDLCALC UNABLE TO CALCULATE IF TRIGLYCERIDE OVER 400 mg/dL  TRIG 453*  CHOLHDL 7.1   Thyroid Function Tests: No results for input(s): TSH, T4TOTAL, FREET4, T3FREE, THYROIDAB in the last 72 hours. Anemia Panel: No results for input(s): VITAMINB12, FOLATE, FERRITIN, TIBC, IRON, RETICCTPCT in the last 72 hours. Urine analysis:    Component Value Date/Time   COLORURINE Straw 10/27/2014 1429   COLORURINE YELLOW 10/02/2012 1041   APPEARANCEUR Clear 10/27/2014 1429   LABSPEC 1.033 10/27/2014 1429   PHURINE 6.0 10/27/2014 1429   PHURINE 7.0 10/02/2012 1041   GLUCOSEU >=500 10/27/2014 1429   HGBUR Negative 10/27/2014 1429   HGBUR NEG 10/02/2012 1041   BILIRUBINUR Negative 10/27/2014 [place] 10/27/2014 1429   KETONESUR NEG 10/02/2012 1041   PROTEINUR Negative 10/27/2014 1429   PROTEINUR NEG 10/02/2012 1041   UROBILINOGEN 0.2 10/02/2012 1041   NITRITE Negative 10/27/2014 1429   NITRITE NEG 10/02/2012 1041   LEUKOCYTESUR Negative 10/27/2014 1429    Radiological Exams on Admission: CARDIAC CATHETERIZATION  Result Date: 10/27/2019  A drug-eluting stent was successfully placed using a STENT RESOLUTE ONYX 2.75X30.  Prox LAD to Mid LAD lesion is 100% stenosed.  Post intervention, there is a 0% residual stenosis.  1st Diag-1 lesion is 90% stenosed.  1st Diag-2 lesion is 80% stenosed.  1.  Codominant coronary arteries with thrombotic occlusion of the proximal LAD which is the culprit for anterior ST elevation myocardial infarction.  No other obstructive disease. 2.  Left ventricular angiography was not performed due to severely elevated left ventricular end-diastolic pressure at 34 mmHg 3.  Successful angioplasty and drug-eluting stent placement to the proximal LAD. Recommendations: We will use Aggrastat infusion for 18 hours due to residual thrombus in first diagonal. Dual antiplatelet therapy for at least 1 year. Aggressive treatment of risk  factors. The patient was given 1 dose of IV furosemide 40 mg once daily before he left the Cath Lab.  I do not expect him to require more diuresis but can be followed clinically. I ordered an echocardiogram.    EKG: Independently reviewed.  Sinus tachycardia 101 bpm, ST elevation in leads V2 through V5.  Assessment/Plan Principal Problem:   Acute ST elevation myocardial infarction (STEMI) of anterior wall (HCC) Active Problems:   Diabetes mellitus type 2 in obese (HCC)   Essential hypertension   Obesity   INSOMNIA, CHRONIC   GERD   Depression with anxiety   History of colon cancer   Psoriatic arthritis (HCC)   Acute ST elevation myocardial infarction (STEMI) of anterior wall  --Management per cardiology --Continue dual platelet therapy --Aggrastat for 18 hours --Continue beta-blocker and ARB --Follow-up echo  Diabetes mellitus type 2 in obese  --Hold Metformin --Sensitive sliding scale level --Follow-up A1c  Essential hypertension -home losartan continued  Obesity -BMI 34.98.  Diet and lifestyle for weight loss.  INSOMNIA, CHRONIC -trazodone ordered as needed  GERD -PPI continued  Depression with anxiety -on Lexapro and Valium continued  History of colon cancer -early stages, status post resection  Psoriatic arthritis ([place])    DVT prophylaxis: Lovenox Code Status: Full Family  Communication: None at bedside during encounter Disposition Plan: Expect discharge home pending clearance by cardiology Consults called: None Admission status: Inpatient  Severity of Illness: The appropriate patient status for this patient is INPATIENT. It is not anticipated that the patient will be medically stable for discharge from the hospital within 2 midnights of admission, as patient presented with STEMI and is status post PCI today.  The following factors support the patient status of inpatient: --Presenting symptoms include  chest pain. --Worrisome physical exam findings obesity.  --Initial radiographic and laboratory data are worrisome because of  EKG was ST segment elevation in leads V2 through V4, severely elevated troponin. --Chronic co-morbidities include  type 2 diabetes, obesity, hypertension, hyperlipidemia, history of colon cancer, psoriatic arthritis, depression anxiety, insomnia, IBS, history of right Bell's palsy.    Ezekiel Slocumb, DO [facility]   If 7PM-7AM, please contact night-coverage [email or web address]  10/27/2019, 5:48 PM

## 2019-10-27 NOTE — Consult Note (Signed)
[place] for Aggrastat  Patient was administered Aggrastat 2395 mcg bolus in the cath labs and started @ 1145. Patient was transferred to the floor on maintenance Aggrastat @ 0.15 mcg/kg/min. Called ICU nurse to confirm Aggrastat in still running and d/w her that it should run for 18 hours. Informed her that the stop time will be for 4/11 @ 0900 and will place a note to nursing as a reminder.   Thank you for allowing pharmacy to be a part of this patient's care.   Kristeen Miss, PharmD Clinical Pharmacist

## 2019-10-27 NOTE — ED Notes (Addendum)
Cardiologist, Dr. Arida, at bedside.  

## 2019-10-28 ENCOUNTER — Inpatient Hospital Stay (HOSPITAL_COMMUNITY)
Admit: 2019-10-28 | Discharge: 2019-10-28 | Disposition: A | Discharge status: Home / Self Care | Payer: Commercial Managed Care - PPO | Attending: Cardiovascular Disease | Admitting: Cardiovascular Disease

## 2019-10-28 DIAGNOSIS — I2109 ST elevation (STEMI) myocardial infarction involving other coronary artery of anterior wall: Secondary | ICD-10-CM

## 2019-10-28 DIAGNOSIS — I10 Essential (primary) hypertension: Secondary | ICD-10-CM

## 2019-10-28 LAB — CBC
HCT: 38.1 % — ABNORMAL LOW (ref 39.0–52.0)
Hemoglobin: 13.1 g/dL (ref 13.0–17.0)
MCH: 31.9 pg (ref 26.0–34.0)
MCHC: 34.4 g/dL (ref 30.0–36.0)
MCV: 92.7 fL (ref 80.0–100.0)
Platelets: 385 10*3/uL (ref 150–400)
RBC: 4.11 MIL/uL — ABNORMAL LOW (ref 4.22–5.81)
RDW: 13.4 % (ref 11.5–15.5)
WBC: 17.3 10*3/uL — ABNORMAL HIGH (ref 4.0–10.5)
nRBC: 0 % (ref 0.0–0.2)

## 2019-10-28 LAB — GLUCOSE, CAPILLARY
Glucose-Capillary: 263 mg/dL — ABNORMAL HIGH (ref 70–99)
Glucose-Capillary: 257 mg/dL — ABNORMAL HIGH (ref 70–99)
Glucose-Capillary: 299 mg/dL — ABNORMAL HIGH (ref 70–99)
Glucose-Capillary: 239 mg/dL — ABNORMAL HIGH (ref 70–99)

## 2019-10-28 LAB — ECHOCARDIOGRAM COMPLETE
Height: 67 in
Weight: 3492.09 oz

## 2019-10-28 LAB — HEPATIC FUNCTION PANEL
ALT: 87 U/L — ABNORMAL HIGH (ref 0–44)
AST: 282 U/L — ABNORMAL HIGH (ref 15–41)
Albumin: 3.2 g/dL — ABNORMAL LOW (ref 3.5–5.0)
Alkaline Phosphatase: 66 U/L (ref 38–126)
Bilirubin, Direct: 0.2 mg/dL (ref 0.0–0.2)
Indirect Bilirubin: 1.4 mg/dL — ABNORMAL HIGH (ref 0.3–0.9)
Total Bilirubin: 1.6 mg/dL — ABNORMAL HIGH (ref 0.3–1.2)
Total Protein: 6.5 g/dL (ref 6.5–8.1)

## 2019-10-28 LAB — BASIC METABOLIC PANEL
Anion gap: 11 (ref 5–15)
BUN: 9 mg/dL (ref 6–20)
CO2: 23 mmol/L (ref 22–32)
Calcium: 8.4 mg/dL — ABNORMAL LOW (ref 8.9–10.3)
Chloride: 102 mmol/L (ref 98–111)
Creatinine, Ser: 0.65 mg/dL (ref 0.61–1.24)
GFR calc Af Amer: >60 mL/min (ref >60)
GFR calc non Af Amer: >60 mL/min (ref >60)
Glucose, Bld: 278 mg/dL — ABNORMAL HIGH (ref 70–99)
Potassium: 3.1 mmol/L — ABNORMAL LOW (ref 3.5–5.1)
Sodium: 136 mmol/L (ref 135–145)

## 2019-10-28 LAB — LDL CHOLESTEROL, DIRECT: Direct LDL: 112.7 mg/dL — ABNORMAL HIGH (ref 0–99)

## 2019-10-28 MED ORDER — INSULIN ASPART 100 UNIT/ML ~~LOC~~ SOLN
4.0000 [IU] | Freq: Three times a day (TID) | SUBCUTANEOUS | Status: DC
  Administered 2019-10-28 (×2): 4 [IU] via SUBCUTANEOUS
  Filled 2019-10-28 (×2): qty 1

## 2019-10-28 MED ORDER — CARVEDILOL 6.25 MG PO TABS
6.2500 mg | ORAL_TABLET | Freq: Two times a day (BID) | ORAL | Status: DC
  Administered 2019-10-28 – 2019-10-29 (×2): 6.25 mg via ORAL
  Filled 2019-10-28 (×2): qty 1

## 2019-10-28 MED ORDER — MELATONIN 5 MG PO TABS
5.0000 mg | ORAL_TABLET | Freq: Every day | ORAL | Status: DC
  Administered 2019-10-28: 5 mg via ORAL
  Filled 2019-10-28: qty 1

## 2019-10-28 MED ORDER — POTASSIUM CHLORIDE CRYS ER 20 MEQ PO TBCR
40.0000 meq | EXTENDED_RELEASE_TABLET | ORAL | Status: AC
  Administered 2019-10-28 (×2): 40 meq via ORAL
  Filled 2019-10-28 (×2): qty 2

## 2019-10-28 MED ORDER — INSULIN GLARGINE 100 UNIT/ML ~~LOC~~ SOLN
20.0000 [IU] | Freq: Every day | SUBCUTANEOUS | Status: DC
  Administered 2019-10-28: 20 [IU] via SUBCUTANEOUS
  Filled 2019-10-28 (×2): qty 0.2

## 2019-10-28 MED ORDER — CARVEDILOL 3.125 MG PO TABS
3.1250 mg | ORAL_TABLET | Freq: Once | ORAL | Status: AC
  Administered 2019-10-28: 3.125 mg via ORAL
  Filled 2019-10-28: qty 1

## 2019-10-28 MED ORDER — PERFLUTREN LIPID MICROSPHERE
1.0000 mL | INTRAVENOUS | Status: AC | PRN
  Administered 2019-10-28: 2 mL via INTRAVENOUS
  Filled 2019-10-28: qty 10

## 2019-10-28 MED ORDER — LOSARTAN POTASSIUM 25 MG PO TABS
12.5000 mg | ORAL_TABLET | Freq: Every day | ORAL | Status: DC
  Administered 2019-10-28 – 2019-10-29 (×2): 12.5 mg via ORAL
  Filled 2019-10-28: qty 0.5
  Filled 2019-10-28: qty 1

## 2019-10-28 NOTE — Progress Notes (Addendum)
PROGRESS NOTE    [name]   [id number]  DOB: 1960-10-15  PCP: Mosie Lukes, MD    DOA: 10/27/2019 LOS: 1   Brief Narrative   [name] is a 59 y.o. male with medical history significant of type 2 diabetes, hypertension, hyperlipidemia, colon cancer status post resection, psoriasis and psoriatic arthritis, insomnia presented to the ED via EMS today with chest pain.  EKG showed anterior ST elevation, patient was taken emergently to Cath Lab or single stent was placed to LAD.  Patient is post procedure and stable, but continues with 1-2 out of 10 chest pain.  He reports he was doing some yard work, had lifted up his lawnmower when the chest pain started.  States it did not let up with rest.  States that because he has no health insurance currently, he waited to see if it would let up before coming to hospital.  He reports otherwise being in relatively good health recently, no recent illnesses.  Denies fevers or chills, shortness of breath, headache, abdominal pain, nausea vomiting or diarrhea, dysuria, focal weakness or numbness tingling or other recent complaints.  In the ED, HR 102, RR 24, initial BP 110/67, O2 sat 98% on room air.  Labs were notable for hyperglycemia, initial troponin 783 and later >27,000.  CBC with leukocytosis 17.4.  EKG showed ST segment elevation in anterior leads.  Patient was taken emergently to the Cath Lab where a single stent was placed to the LAD.  Patient since stable and chest pain has resolved.   Assessment & Plan   Principal Problem:   Acute ST elevation myocardial infarction (STEMI) of anterior wall (HCC) Active Problems:   Diabetes mellitus type 2 in obese (HCC)   Essential hypertension   Obesity   INSOMNIA, CHRONIC   GERD   Depression with anxiety   History of colon cancer   Psoriatic arthritis ([place])   Acute ST elevation myocardial infarction (STEMI) of anterior wall  --Management per cardiology --Continue dual platelet  therapy --Aggrastat for 18 hours --Continue beta-blocker and ARB --Follow-up echo  Diabetes mellitus type 2 in obese -  home regimen is Basaglar 60 to 80 units at bedtime, Humalog 5 to 20 units 3 times daily with meals --Sensitive sliding scale level --Follow-up A1c  Transaminitis -not present on admission, suspect shock liver in the setting of STEMI.  LFTs were normal on admission.  Recheck CMP tomorrow, expect trend down.    Essential hypertension -home losartan continued  Obesity -BMI 34.98.  Diet and lifestyle for weight loss.  INSOMNIA, CHRONIC -trazodone ordered as needed  GERD -PPI continued  Depression with anxiety -on Lexapro and Valium continued  History of colon cancer -early stages, status post resection  Psoriatic arthritis ([place]) -stable.  Takes methotrexate but not sure what day of the week.   Patient BMI: Body mass index is 34.18 kg/m.   DVT prophylaxis: Lovenox  Diet:  Diet Orders (From admission, onward)    Start     Ordered   10/27/19 1540  Diet Carb Modified Fluid consistency: Thin; Room service appropriate? Yes  Diet effective now    Question Answer Comment  Diet-HS Snack? Nothing   Calorie Level Medium 1600-2000   Fluid consistency: Thin   Room service appropriate? Yes      10/27/19 1539            Code Status: Full Code    Subjective 10/28/19    Patient seen at bedside this morning.  No acute  events reported overnight.  He reports his chest pain has now resolved.  Denies any shortness of breath, fevers or chills, other acute complaints.  Asked if it is okay for him to get up and walk around.  States he is getting antibiotic bed and not removed.  Reports a great deal of stress over his current lack of job and insurance.   Disposition Plan & Communication   Dispo & Barriers: Pending clearance by cardiology, discharge home Coming from: Home, independent Exp d/c date: 4/12 Medically stable for d/c?  No  Family Communication: None  at bedside during encounter, patient to update   Consults, Procedures, Significant Events   Consultants:   Cardiology  Procedures:   Cardiac cath on 4/10, stent to LAD  Antimicrobials:   None   Objective   Vitals:   10/28/19 0900 10/28/19 1000 10/28/19 1100 10/28/19 1200  BP: 119/83 125/85 128/88 120/72  Pulse: 87 (!) 111 (!) 108 (!) 107  Resp: (!) 21 15 (!) 23 18  Temp:      TempSrc:      SpO2: 95% 92% 95% 95%  Weight:      Height:        Intake/Output Summary (Last 24 hours) at 10/28/2019 1421 Last data filed at 10/28/2019 0913 Gross per 24 hour  Intake 510.62 ml  Output 2400 ml  Net -1889.38 ml   Filed Weights   10/27/19 1418 10/27/19 1540  Weight: 95.8 kg 99 kg    Physical Exam:  General exam: awake, alert, no acute distress, obese HEENT: moist mucus membranes, hearing grossly normal  Respiratory system: CTAB, normal respiratory effort. Cardiovascular system: normal S1/S2, RRR, no pedal edema.   Central nervous system: A&O x4. no gross focal neurologic deficits, normal speech Extremities: moves all, no edema, normal tone Skin: dry, intact, normal temperature, normal color Psychiatry: normal mood, congruent affect, judgement and insight appear normal  Labs   Data Reviewed: I have personally reviewed following labs and imaging studies  CBC: Recent Labs  Lab 10/27/19 1406 10/28/19 0526  WBC 17.4* 17.3*  NEUTROABS 13.8*  --   HGB 14.8 13.1  HCT 42.7 38.1*  MCV 93.8 92.7  PLT 433* 0000000   Basic Metabolic Panel: Recent Labs  Lab 10/27/19 1406 10/27/19 1725 10/28/19 0526  NA 137  --  136  K 3.5 3.5 3.1*  CL 105  --  102  CO2 21*  --  23  GLUCOSE 273*  --  278*  BUN 8  --  9  CREATININE 0.70  --  0.65  CALCIUM 8.7*  --  8.4*  MG  --  1.9  --    GFR: Estimated Creatinine Clearance: 115.6 mL/min (by C-G formula based on SCr of 0.65 mg/dL). Liver Function Tests: Recent Labs  Lab 10/27/19 1406 10/28/19 0526  AST 27 282*  ALT 18 87*   ALKPHOS 75 66  BILITOT 1.6* 1.6*  PROT 7.5 6.5  ALBUMIN 3.9 3.2*   No results for input(s): LIPASE, AMYLASE in the last 168 hours. No results for input(s): AMMONIA in the last 168 hours. Coagulation Profile: Recent Labs  Lab 10/27/19 1406  INR 1.0   Cardiac Enzymes: No results for input(s): CKTOTAL, CKMB, CKMBINDEX, TROPONINI in the last 168 hours. BNP (last 3 results) No results for input(s): PROBNP in the last 8760 hours. HbA1C: No results for input(s): HGBA1C in the last 72 hours. CBG: Recent Labs  Lab 10/27/19 1540 10/27/19 2257 10/28/19 0753 10/28/19 1130  GLUCAP 261*  271* 263* 257*   Lipid Profile: Recent Labs    10/27/19 1406  CHOL 221*  HDL 31*  LDLCALC UNABLE TO CALCULATE IF TRIGLYCERIDE OVER 400 mg/dL  TRIG 453*  CHOLHDL 7.1  LDLDIRECT 112.7*   Thyroid Function Tests: No results for input(s): TSH, T4TOTAL, FREET4, T3FREE, THYROIDAB in the last 72 hours. Anemia Panel: No results for input(s): VITAMINB12, FOLATE, FERRITIN, TIBC, IRON, RETICCTPCT in the last 72 hours. Sepsis Labs: No results for input(s): PROCALCITON, LATICACIDVEN in the last 168 hours.  Recent Results (from the past 240 hour(s))  Respiratory Panel by RT PCR (Flu A&B, Covid) - Nasopharyngeal Swab     Status: None   Collection Time: 10/27/19  2:06 PM   Specimen: Nasopharyngeal Swab  Result Value Ref Range Status   SARS Coronavirus 2 by RT PCR NEGATIVE NEGATIVE Final    Comment: (NOTE) SARS-CoV-2 target nucleic acids are NOT DETECTED. The SARS-CoV-2 RNA is generally detectable in upper respiratoy specimens during the acute phase of infection. The lowest concentration of SARS-CoV-2 viral copies this assay can detect is 131 copies/mL. A negative result does not preclude SARS-Cov-2 infection and should not be used as the sole basis for treatment or other patient management decisions. A negative result may occur with  improper specimen collection/handling, submission of specimen  other than nasopharyngeal swab, presence of viral mutation(s) within the areas targeted by this assay, and inadequate number of viral copies (<131 copies/mL). A negative result must be combined with clinical observations, patient history, and epidemiological information. The expected result is Negative. Fact Sheet for Patients:  PinkCheek.be Fact Sheet for Healthcare Providers:  GravelBags.it This test is not yet ap proved or cleared by the [place] FDA and  has been authorized for detection and/or diagnosis of SARS-CoV-2 by FDA under an Emergency Use Authorization (EUA). This EUA will remain  in effect (meaning this test can be used) for the duration of the COVID-19 declaration under Section 564(b)(1) of the Act, 21 U.S.C. section 360bbb-3(b)(1), unless the authorization is terminated or revoked sooner.    Influenza A by PCR NEGATIVE NEGATIVE Final   Influenza B by PCR NEGATIVE NEGATIVE Final    Comment: (NOTE) The Xpert Xpress SARS-CoV-2/FLU/RSV assay is intended as an aid in  the diagnosis of influenza from Nasopharyngeal swab specimens and  should not be used as a sole basis for treatment. Nasal washings and  aspirates are unacceptable for Xpert Xpress SARS-CoV-2/FLU/RSV  testing. Fact Sheet for Patients: PinkCheek.be Fact Sheet for Healthcare Providers: GravelBags.it This test is not yet approved or cleared by the [place] FDA and  has been authorized for detection and/or diagnosis of SARS-CoV-2 by  FDA under an Emergency Use Authorization (EUA). This EUA will remain  in effect (meaning this test can be used) for the duration of the  Covid-19 declaration under Section 564(b)(1) of the Act, 21  U.S.C. section 360bbb-3(b)(1), unless the authorization is  terminated or revoked. Performed at [facility], [address] [place], [place]  [address]   MRSA PCR Screening     Status: None   Collection Time: 10/27/19  3:45 PM   Specimen: Nasopharyngeal  Result Value Ref Range Status   MRSA by PCR NEGATIVE NEGATIVE Final    Comment:        The GeneXpert MRSA Assay (FDA approved for NASAL specimens only), is one component of a comprehensive MRSA colonization surveillance program. It is not intended to diagnose MRSA infection nor to guide or monitor treatment for MRSA  infections. Performed at [facility], [address] [place], [place] [address]       Imaging Studies   CARDIAC CATHETERIZATION  Result Date: 10/27/2019  A drug-eluting stent was successfully placed using a STENT RESOLUTE ONYX 2.75X30.  Prox LAD to Mid LAD lesion is 100% stenosed.  Post intervention, there is a 0% residual stenosis.  1st Diag-1 lesion is 90% stenosed.  1st Diag-2 lesion is 80% stenosed.  1.  Codominant coronary arteries with thrombotic occlusion of the proximal LAD which is the culprit for anterior ST elevation myocardial infarction.  No other obstructive disease. 2.  Left ventricular angiography was not performed due to severely elevated left ventricular end-diastolic pressure at 34 mmHg 3.  Successful angioplasty and drug-eluting stent placement to the proximal LAD. Recommendations: We will use Aggrastat infusion for 18 hours due to residual thrombus in first diagonal. Dual antiplatelet therapy for at least 1 year. Aggressive treatment of risk factors. The patient was given 1 dose of IV furosemide 40 mg once daily before he left the Cath Lab.  I do not expect him to require more diuresis but can be followed clinically. I ordered an echocardiogram.   ECHOCARDIOGRAM COMPLETE  Result Date: 10/28/2019    ECHOCARDIOGRAM REPORT   Patient Name:   [name] Date of Exam: 10/28/2019 Medical Rec #:  [id number]      Height:       67.0 in Accession #:    [id number]     Weight:       218.3 lb Date of Birth:  Dec 26, 1960      BSA:          2.099  m Patient Age:    13 years       BP:           109/73 mmHg Patient Gender: M              HR:           100 bpm. Exam Location:  [facility] Procedure: 2D Echo Indications:     ACUTE MYOCARDIAL INFARCTION 410  History:         Patient has prior history of Echocardiogram examinations, most                  recent 12/26/2017. Risk Factors:Diabetes, Hypertension and                  Dyslipidemia.  Sonographer:     Avanell Shackleton Referring Phys:  [id number] A ARIDA Diagnosing Phys: Kate Sable MD IMPRESSIONS  1. Left ventricular ejection fraction, by estimation, is 35 to 40%. The left ventricle has moderately decreased function. The left ventricle demonstrates regional wall motion abnormalities (see scoring diagram/findings for description). Left ventricular  diastolic parameters are consistent with Grade I diastolic dysfunction (impaired relaxation). There is severe hypokinesis of the left ventricular, entire anteroseptal wall and anterior wall.  2. Right ventricular systolic function is normal. The right ventricular size is normal.  3. The mitral valve is normal in structure. No evidence of mitral valve regurgitation.  4. The aortic valve is normal in structure. Aortic valve regurgitation is not visualized.  5. The inferior vena cava is dilated in size with >50% respiratory variability, suggesting right atrial pressure of 8 mmHg. FINDINGS  Left Ventricle: No evidence for LV thrombus noted. Left ventricular ejection fraction, by estimation, is 35 to 40%. The left ventricle has moderately decreased function. The left ventricle demonstrates regional wall motion abnormalities. Severe hypokinesis  of the left ventricular, entire anteroseptal wall and anterior wall. Definity contrast agent was given IV to delineate the left ventricular endocardial borders. The left ventricular internal cavity size was normal in size. There is no left ventricular hypertrophy. Left ventricular diastolic parameters are consistent with Grade I  diastolic dysfunction (impaired relaxation). Right Ventricle: The right ventricular size is normal. Right vetricular wall thickness was not assessed. Right ventricular systolic function is normal. Left Atrium: Left atrial size was normal in size. Right Atrium: Right atrial size was normal in size. Pericardium: Trivial pericardial effusion is present. Mitral Valve: The mitral valve is normal in structure. No evidence of mitral valve regurgitation. Tricuspid Valve: The tricuspid valve is normal in structure. Tricuspid valve regurgitation is trivial. Aortic Valve: The aortic valve is normal in structure. Aortic valve regurgitation is not visualized. Pulmonic Valve: The pulmonic valve was not well visualized. Pulmonic valve regurgitation is not visualized. Aorta: The aortic root is normal in size and structure. Venous: The inferior vena cava is dilated in size with greater than 50% respiratory variability, suggesting right atrial pressure of 8 mmHg. IAS/Shunts: No atrial level shunt detected by color flow Doppler.  LEFT VENTRICLE PLAX 2D LVIDd:         4.65 cm  Diastology LVIDs:         3.28 cm  LV e' lateral:   0.07 cm/s LV PW:         0.83 cm  LV E/e' lateral: 6.7 LV IVS:        1.71 cm  LV e' medial:    0.05 cm/s LVOT diam:     1.90 cm  LV E/e' medial:  9.3 LVOT Area:     2.84 cm  RIGHT VENTRICLE RV Mid diam:    2.91 cm LEFT ATRIUM             Index LA diam:        4.10 cm 1.95 cm/m LA Vol (A2C):   88.1 ml 41.98 ml/m LA Vol (A4C):   38.0 ml 18.11 ml/m LA Biplane Vol: 57.8 ml 27.54 ml/m   AORTA Ao Root diam: 3.70 cm MITRAL VALVE MV Area (PHT): 5.27 cm    SHUNTS MV Decel Time: 144 msec    Systemic Diam: 1.90 cm MV E velocity: 0.46 cm/s MV A velocity: 63.40 cm/s MV E/A ratio:  0.01 Kate Sable MD Electronically signed by Kate Sable MD Signature Date/Time: 10/28/2019/1:46:03 PM    Final      Medications   Scheduled Meds: . aspirin  324 mg Oral Once  . aspirin  81 mg Oral Daily  . atorvastatin   80 mg Oral q1800  . carvedilol  6.25 mg Oral BID WC  . Chlorhexidine Gluconate Cloth  6 each Topical Daily  . enoxaparin (LOVENOX) injection  40 mg Subcutaneous Q24H  . escitalopram  10 mg Oral Daily  . insulin aspart  0-5 Units Subcutaneous QHS  . insulin aspart  0-9 Units Subcutaneous TID WC  . insulin aspart  4 Units Subcutaneous TID WC  . insulin glargine  20 Units Subcutaneous Daily  . losartan  12.5 mg Oral Daily  . melatonin  5 mg Oral QHS  . pantoprazole  40 mg Oral Daily  . potassium chloride  40 mEq Oral Q4H  . prasugrel  10 mg Oral Daily  . sodium chloride flush  3 mL Intravenous Q12H   Continuous Infusions: . sodium chloride    . sodium chloride  LOS: 1 day    Time spent: 40 minutes    Ezekiel Slocumb, DO [facility]   If 7PM-7AM, please contact night-coverage [email or web address] 10/28/2019, 2:21 PM

## 2019-10-28 NOTE — Progress Notes (Signed)
Progress Note  Patient Name: [name] Date of Encounter: 10/28/2019  Primary Cardiologist: Dr. Fletcher Anon  Subjective   Patient states feeling much better today.  Denies chest pain.  About to have echocardiogram performed.  Inpatient Medications    Scheduled Meds: . aspirin  324 mg Oral Once  . aspirin  81 mg Oral Daily  . atorvastatin  80 mg Oral q1800  . carvedilol  6.25 mg Oral BID WC  . Chlorhexidine Gluconate Cloth  6 each Topical Daily  . enoxaparin (LOVENOX) injection  40 mg Subcutaneous Q24H  . escitalopram  10 mg Oral Daily  . insulin aspart  0-5 Units Subcutaneous QHS  . insulin aspart  0-9 Units Subcutaneous TID WC  . insulin aspart  4 Units Subcutaneous TID WC  . insulin glargine  20 Units Subcutaneous Daily  . losartan  12.5 mg Oral Daily  . melatonin  5 mg Oral QHS  . pantoprazole  40 mg Oral Daily  . potassium chloride  40 mEq Oral Q4H  . prasugrel  10 mg Oral Daily  . sodium chloride flush  3 mL Intravenous Q12H   Continuous Infusions: . sodium chloride    . sodium chloride     PRN Meds: sodium chloride, acetaminophen, diazepam, HYDROcodone-acetaminophen, ondansetron (ZOFRAN) IV, sodium chloride flush, traZODone   Vital Signs    Vitals:   10/28/19 0815 10/28/19 0900 10/28/19 1000 10/28/19 1100  BP: 109/85 119/83 125/85 128/88  Pulse: (!) 107 87 (!) 111 (!) 108  Resp:  (!) 21 15 (!) 23  Temp: 98.1 F (36.7 C)     TempSrc: Oral     SpO2:  95% 92% 95%  Weight:      Height:        Intake/Output Summary (Last 24 hours) at 10/28/2019 1142 Last data filed at 10/28/2019 0913 Gross per 24 hour  Intake 510.62 ml  Output 2400 ml  Net -1889.38 ml   Last 3 Weights 10/27/2019 10/27/2019 06/28/2019  Weight (lbs) 218 lb 4.1 oz 211 lb 3.2 oz 220 lb 6.4 oz  Weight (kg) 99 kg 95.8 kg 99.973 kg      Telemetry    Normal sinus rhythm- Personally Reviewed  ECG    Sinus rhythm, 0.5 to 1 mm ST elevation in anterior leads much improved from prior.-  Personally Reviewed  Physical Exam   GEN: No acute distress.   Neck: No JVD Cardiac: RRR, no murmurs, rubs, or gallops.  Respiratory: Clear to auscultation bilaterally. GI: Soft, nontender, non-distended  MS: No edema; No deformity. Neuro:  Nonfocal  Psych: Normal affect   Labs    High Sensitivity Troponin:   Recent Labs  Lab 10/27/19 1406 10/27/19 1601 10/27/19 1725 10/27/19 1933  TROPONINIHS 783* >27,000* >27,000* >27,000*      Chemistry Recent Labs  Lab 10/27/19 1406 10/27/19 1725 10/28/19 0526  NA 137  --  136  K 3.5 3.5 3.1*  CL 105  --  102  CO2 21*  --  23  GLUCOSE 273*  --  278*  BUN 8  --  9  CREATININE 0.70  --  0.65  CALCIUM 8.7*  --  8.4*  PROT 7.5  --  6.5  ALBUMIN 3.9  --  3.2*  AST 27  --  282*  ALT 18  --  87*  ALKPHOS 75  --  66  BILITOT 1.6*  --  1.6*  GFRNONAA >60  --  >60  GFRAA >60  --  >60  ANIONGAP 11  --  11     Hematology Recent Labs  Lab 10/27/19 1406 10/28/19 0526  WBC 17.4* 17.3*  RBC 4.55 4.11*  HGB 14.8 13.1  HCT 42.7 38.1*  MCV 93.8 92.7  MCH 32.5 31.9  MCHC 34.7 34.4  RDW 13.3 13.4  PLT 433* 385    BNPNo results for input(s): BNP, PROBNP in the last 168 hours.   DDimer No results for input(s): DDIMER in the last 168 hours.   Radiology    CARDIAC CATHETERIZATION  Result Date: 10/27/2019  A drug-eluting stent was successfully placed using a STENT RESOLUTE ONYX 2.75X30.  Prox LAD to Mid LAD lesion is 100% stenosed.  Post intervention, there is a 0% residual stenosis.  1st Diag-1 lesion is 90% stenosed.  1st Diag-2 lesion is 80% stenosed.  1.  Codominant coronary arteries with thrombotic occlusion of the proximal LAD which is the culprit for anterior ST elevation myocardial infarction.  No other obstructive disease. 2.  Left ventricular angiography was not performed due to severely elevated left ventricular end-diastolic pressure at 34 mmHg 3.  Successful angioplasty and drug-eluting stent placement to the  proximal LAD. Recommendations: We will use Aggrastat infusion for 18 hours due to residual thrombus in first diagonal. Dual antiplatelet therapy for at least 1 year. Aggressive treatment of risk factors. The patient was given 1 dose of IV furosemide 40 mg once daily before he left the Cath Lab.  I do not expect him to require more diuresis but can be followed clinically. I ordered an echocardiogram.    Cardiac Studies   LHC 10/27/2018  A drug-eluting stent was successfully placed using a STENT RESOLUTE ONYX 2.75X30.  Prox LAD to Mid LAD lesion is 100% stenosed.  Post intervention, there is a 0% residual stenosis.  1st Diag-1 lesion is 90% stenosed.  1st Diag-2 lesion is 80% stenosed.   1.  Codominant coronary arteries with thrombotic occlusion of the proximal LAD which is the culprit for anterior ST elevation myocardial infarction.  No other obstructive disease. 2.  Left ventricular angiography was not performed due to severely elevated left ventricular end-diastolic pressure at 34 mmHg 3.  Successful angioplasty and drug-eluting stent placement to the proximal LAD.  Recommendations: We will use Aggrastat infusion for 18 hours due to residual thrombus in first diagonal. Dual antiplatelet therapy for at least 1 year. Aggressive treatment of risk factors.  Patient Profile     59 y.o. male with history of diabetes, hyperlipidemia, tobacco use, hypertension presenting with chest pain.  Found to have an anterior ST elevated MI, status post drug-eluting stent to the proximal to mid LAD.  Assessment & Plan    1.  Anterior STEMI -Status post DES to the proximal to mid LAD -Currently chest pain-free -Aspirin, Effient x1 year at least -Lipitor 80 mg -Will increase Coreg to 6.25 mg twice daily -Get echocardiogram today.  Patient can be moved from ICU to telemetry.  2.  Hypertension -Coreg, losartan  3.  Hyperlipidemia -Lipitor 80 mg daily  4.  Tobacco use -Recommend cessation      Signed, Kate Sable, MD  10/28/2019, 11:42 AM

## 2019-10-29 ENCOUNTER — Telehealth: Payer: Self-pay

## 2019-10-29 ENCOUNTER — Encounter: Payer: Self-pay | Admitting: Cardiology

## 2019-10-29 LAB — COMPREHENSIVE METABOLIC PANEL
ALT: 65 U/L — ABNORMAL HIGH (ref 0–44)
AST: 87 U/L — ABNORMAL HIGH (ref 15–41)
Albumin: 2.9 g/dL — ABNORMAL LOW (ref 3.5–5.0)
Alkaline Phosphatase: 61 U/L (ref 38–126)
Anion gap: 8 (ref 5–15)
BUN: 15 mg/dL (ref 6–20)
CO2: 22 mmol/L (ref 22–32)
Calcium: 8.1 mg/dL — ABNORMAL LOW (ref 8.9–10.3)
Chloride: 105 mmol/L (ref 98–111)
Creatinine, Ser: 0.78 mg/dL (ref 0.61–1.24)
GFR calc Af Amer: >60 mL/min (ref >60)
GFR calc non Af Amer: >60 mL/min (ref >60)
Glucose, Bld: 314 mg/dL — ABNORMAL HIGH (ref 70–99)
Potassium: 3.7 mmol/L (ref 3.5–5.1)
Sodium: 135 mmol/L (ref 135–145)
Total Bilirubin: 1.2 mg/dL (ref 0.3–1.2)
Total Protein: 6 g/dL — ABNORMAL LOW (ref 6.5–8.1)

## 2019-10-29 LAB — GLUCOSE, CAPILLARY
Glucose-Capillary: 292 mg/dL — ABNORMAL HIGH (ref 70–99)
Glucose-Capillary: 270 mg/dL — ABNORMAL HIGH (ref 70–99)

## 2019-10-29 LAB — CBC
HCT: 35.7 % — ABNORMAL LOW (ref 39.0–52.0)
Hemoglobin: 12 g/dL — ABNORMAL LOW (ref 13.0–17.0)
MCH: 32.4 pg (ref 26.0–34.0)
MCHC: 33.6 g/dL (ref 30.0–36.0)
MCV: 96.5 fL (ref 80.0–100.0)
Platelets: 315 10*3/uL (ref 150–400)
RBC: 3.7 MIL/uL — ABNORMAL LOW (ref 4.22–5.81)
RDW: 13.4 % (ref 11.5–15.5)
WBC: 13.2 10*3/uL — ABNORMAL HIGH (ref 4.0–10.5)
nRBC: 0 % (ref 0.0–0.2)

## 2019-10-29 LAB — MAGNESIUM: Magnesium: 2 mg/dL (ref 1.7–2.4)

## 2019-10-29 MED ORDER — INSULIN ASPART 100 UNIT/ML ~~LOC~~ SOLN: 6.0000 [IU] | Freq: Three times a day (TID) | SUBCUTANEOUS | Status: DC

## 2019-10-29 MED ORDER — SPIRONOLACTONE 25 MG PO TABS
12.5000 mg | ORAL_TABLET | Freq: Every day | ORAL | Status: DC
  Administered 2019-10-29: 12.5 mg via ORAL
  Filled 2019-10-29: qty 1
  Filled 2019-10-29: qty 0.5

## 2019-10-29 MED ORDER — INSULIN GLARGINE 100 UNIT/ML ~~LOC~~ SOLN
40.0000 [IU] | Freq: Every day | SUBCUTANEOUS | Status: DC
  Administered 2019-10-29: 40 [IU] via SUBCUTANEOUS
  Filled 2019-10-29 (×2): qty 0.4

## 2019-10-29 MED ORDER — INSULIN LISPRO (1 UNIT DIAL) 100 UNIT/ML (KWIKPEN): 15.0000 [IU] | PEN_INJECTOR | Freq: Three times a day (TID) | SUBCUTANEOUS | 11 refills | Status: DC

## 2019-10-29 MED ORDER — LIVING WELL WITH DIABETES BOOK
Freq: Once | Status: DC
  Filled 2019-10-29 (×2): qty 1

## 2019-10-29 MED ORDER — ATORVASTATIN CALCIUM 80 MG PO TABS: 80.0000 mg | ORAL_TABLET | Freq: Every day | ORAL | 1 refills | Status: DC

## 2019-10-29 MED ORDER — ASPIRIN 81 MG PO CHEW: 81.0000 mg | CHEWABLE_TABLET | Freq: Every day | ORAL | 1 refills | Status: DC

## 2019-10-29 MED ORDER — LIVING WELL WITH DIABETES BOOK: 1.0000 | Freq: Once | Status: AC

## 2019-10-29 MED ORDER — INSULIN ASPART 100 UNIT/ML ~~LOC~~ SOLN: 12.0000 [IU] | Freq: Three times a day (TID) | SUBCUTANEOUS | Status: DC

## 2019-10-29 MED ORDER — LOSARTAN POTASSIUM 25 MG PO TABS: 12.5000 mg | ORAL_TABLET | Freq: Every day | ORAL | 1 refills | Status: DC

## 2019-10-29 MED ORDER — SPIRONOLACTONE 25 MG PO TABS: 12.5000 mg | ORAL_TABLET | Freq: Every day | ORAL | 1 refills | Status: DC

## 2019-10-29 MED ORDER — PRASUGREL HCL 10 MG PO TABS: 10.0000 mg | ORAL_TABLET | Freq: Every day | ORAL | 1 refills | Status: DC

## 2019-10-29 MED ORDER — BASAGLAR KWIKPEN 100 UNIT/ML ~~LOC~~ SOPN: 60.0000 [IU] | PEN_INJECTOR | Freq: Every day | SUBCUTANEOUS | 3 refills | Status: DC

## 2019-10-29 MED ORDER — CARVEDILOL 6.25 MG PO TABS: 6.2500 mg | ORAL_TABLET | Freq: Two times a day (BID) | ORAL | 1 refills | Status: DC

## 2019-10-29 NOTE — TOC Transition Note (Signed)
Transition of Care [facility]) - CM/SW Discharge Note   Patient Details  Name: [name] MRN: [id number] Date of Birth: 02/16/1961  [facility] of Care [facility]) CM/SW Contact:  Victorino Dike, RN Phone Number: 10/29/2019, 3:08 PM   Clinical Narrative:     Met with patient and significant other.  Patient is currently unemployed.  He is asking for assistance with medication and PCP.  Patient has been [facility] and prescriptions have been sent to Medication management.  No further TOC needs at this time, please re-consult for new needs.     Final next level of care: Home/Self Care Barriers to Discharge: Barriers Resolved   Patient Goals and CMS Choice        Discharge Placement                       Discharge Plan and Services   Discharge Planning Services: CM Consult, Other - See comment(Medication Management and [facility])                                 Social Determinants of Health (SDOH) Interventions     Readmission Risk Interventions No flowsheet data found.

## 2019-10-29 NOTE — Progress Notes (Signed)
Pt resting quietly on his side with eyes closed.  Resp even and unlabored.  No observed needs at this time.

## 2019-10-29 NOTE — Progress Notes (Signed)
Progress Note  Patient Name: [name] Date of Encounter: 10/29/2019  Primary Cardiologist: new to [facility] - consult by Arida  Subjective   No chest pain or dyspnea. Tolerating medications without issues. Echo showed an EF of 35-40% with severe HK of the entire anteroseptal and anterior wall consistent with his MI territory.   Inpatient Medications    Scheduled Meds: . aspirin  324 mg Oral Once  . aspirin  81 mg Oral Daily  . atorvastatin  80 mg Oral q1800  . carvedilol  6.25 mg Oral BID WC  . Chlorhexidine Gluconate Cloth  6 each Topical Daily  . enoxaparin (LOVENOX) injection  40 mg Subcutaneous Q24H  . escitalopram  10 mg Oral Daily  . insulin aspart  0-5 Units Subcutaneous QHS  . insulin aspart  0-9 Units Subcutaneous TID WC  . insulin aspart  4 Units Subcutaneous TID WC  . insulin glargine  20 Units Subcutaneous Daily  . losartan  12.5 mg Oral Daily  . melatonin  5 mg Oral QHS  . pantoprazole  40 mg Oral Daily  . prasugrel  10 mg Oral Daily  . sodium chloride flush  3 mL Intravenous Q12H   Continuous Infusions: . sodium chloride    . sodium chloride     PRN Meds: sodium chloride, acetaminophen, diazepam, HYDROcodone-acetaminophen, ondansetron (ZOFRAN) IV, sodium chloride flush, traZODone   Vital Signs    Vitals:   10/28/19 1635 10/28/19 1836 10/28/19 1951 10/29/19 0343  BP: 110/74 117/78 119/81 120/60  Pulse: (!) 104 (!) 110 (!) 108 (!) 104  Resp:  20    Temp:  98.4 F (36.9 C) 99.1 F (37.3 C) 98.2 F (36.8 C)  TempSrc:  Oral Oral Oral  SpO2:  96% 99% 98%  Weight:  98.4 kg  99.4 kg  Height:  5\' 7"  (1.702 m)      Intake/Output Summary (Last 24 hours) at 10/29/2019 0720 Last data filed at 10/29/2019 0541 Gross per 24 hour  Intake 240 ml  Output 350 ml  Net -110 ml   Filed Weights   10/27/19 1540 10/28/19 1836 10/29/19 0343  Weight: 99 kg 98.4 kg 99.4 kg    Telemetry    SR, 90s bpm - Personally Reviewed  ECG    Sinus tachycardia, 104  bpm, anteroseptal infarct - Personally Reviewed  Physical Exam   GEN: No acute distress.   Neck: No JVD. Cardiac: RRR, no murmurs, rubs, or gallops. Right radial cath site is well healing without active bleeding, bruising, swelling, warmth, or erythema. Radial pulse 2+. Respiratory: Clear to auscultation bilaterally.  GI: Soft, nontender, non-distended.   MS: No edema; No deformity. Neuro:  Alert and oriented x 3; Nonfocal.  Psych: Normal affect.  Labs    Chemistry Recent Labs  Lab 10/27/19 1406 10/27/19 1406 10/27/19 1725 10/28/19 0526 10/29/19 0533  NA 137  --   --  136 135  K 3.5   < > 3.5 3.1* 3.7  CL 105  --   --  102 105  CO2 21*  --   --  23 22  GLUCOSE 273*  --   --  278* 314*  BUN 8  --   --  9 15  CREATININE 0.70  --   --  0.65 0.78  CALCIUM 8.7*  --   --  8.4* 8.1*  PROT 7.5  --   --  6.5 6.0*  ALBUMIN 3.9  --   --  3.2* 2.9*  AST 27  --   --  282* 87*  ALT 18  --   --  87* 65*  ALKPHOS 75  --   --  66 61  BILITOT 1.6*  --   --  1.6* 1.2  GFRNONAA >60  --   --  >60 >60  GFRAA >60  --   --  >60 >60  ANIONGAP 11  --   --  11 8   < > = values in this interval not displayed.     Hematology Recent Labs  Lab 10/27/19 1406 10/28/19 0526 10/29/19 0533  WBC 17.4* 17.3* 13.2*  RBC 4.55 4.11* 3.70*  HGB 14.8 13.1 12.0*  HCT 42.7 38.1* 35.7*  MCV 93.8 92.7 96.5  MCH 32.5 31.9 32.4  MCHC 34.7 34.4 33.6  RDW 13.3 13.4 13.4  PLT 433* 385 315    Cardiac EnzymesNo results for input(s): TROPONINI in the last 168 hours. No results for input(s): TROPIPOC in the last 168 hours.   BNPNo results for input(s): BNP, PROBNP in the last 168 hours.   DDimer No results for input(s): DDIMER in the last 168 hours.   Radiology    None  Cardiac Studies   LHC 10/27/2019:  A drug-eluting stent was successfully placed using a STENT RESOLUTE ONYX 2.75X30.  Prox LAD to Mid LAD lesion is 100% stenosed.  Post intervention, there is a 0% residual stenosis.  1st Diag-1  lesion is 90% stenosed.  1st Diag-2 lesion is 80% stenosed.   1.  Codominant coronary arteries with thrombotic occlusion of the proximal LAD which is the culprit for anterior ST elevation myocardial infarction.  No other obstructive disease. 2.  Left ventricular angiography was not performed due to severely elevated left ventricular end-diastolic pressure at 34 mmHg 3.  Successful angioplasty and drug-eluting stent placement to the proximal LAD.  Recommendations: We will use Aggrastat infusion for 18 hours due to residual thrombus in first diagonal. Dual antiplatelet therapy for at least 1 year. Aggressive treatment of risk factors. The patient was given 1 dose of IV furosemide 40 mg once daily before he left the Cath Lab.  I do not expect him to require more diuresis but can be followed clinically. I ordered an echocardiogram. __________  2D echo 10/28/2019: 1. Left ventricular ejection fraction, by estimation, is 35 to 40%. The  left ventricle has moderately decreased function. The left ventricle  demonstrates regional wall motion abnormalities (see scoring  diagram/findings for description). Left ventricular  diastolic parameters are consistent with Grade I diastolic dysfunction  (impaired relaxation). There is severe hypokinesis of the left  ventricular, entire anteroseptal wall and anterior wall.  2. Right ventricular systolic function is normal. The right ventricular  size is normal.  3. The mitral valve is normal in structure. No evidence of mitral valve  regurgitation.  4. The aortic valve is normal in structure. Aortic valve regurgitation is  not visualized.  5. The inferior vena cava is dilated in size with >50% respiratory  variability, suggesting right atrial pressure of 8 mmHg.   Patient Profile     59 y.o. male with history of DM, HTN, HLD, and tobacco use admitted with an anterior ST elevation MI on 10/27/2019 s/p emergent LHC with PCI/DES to the mid  LAD.  Assessment & Plan    1. Anterior STEMI: -Currently, chest pain free -Continue DAPT with ASA and Effient without interruption for at least the next 12 months -Aggressive treatment of risk factors  -Secondary prevention  -  Coreg, Lipitor, losartan -PRN SL NTG -Ambulate  -Post cath instructions -Cardiac rehab (phase II has been ordered)  2. Acute HFrEF secondary to ICM: -Echo this admission showed an EF of 35-40% -Add spironolactone 12.5 mg daily -Continue Coreg and losartan -Relative hypotension precludes transition from [facility] to [facility], consider this transition as an outpatient -Recheck echo in ~ 3 months following percutaneous revascularization and optimization of maximally tolerated GDMT -CHF education   3. HTN: -BP well controlled -Continue current medications as outlined above  4. HLD: -Direct LDL of 112 this admission with goal LDL < 70 -Previously on Lipitor 20 mg, titrated to 80 mg this admission -Recheck FLP/LFT in ~ 8 weeks  5. Tobacco use: -Complete cessation is advised   6. Transaminitis: -Improving -Likely in the setting of his MI  7. DM: -Hold metformin 48 hours post LHC -Per IM -A1c pending  -Follow up with PCP   8. Leukocytosis: -No obvious sign of infection -Inflammatory in the setting of his MI -Improving   For questions or updates, please contact [place] Please consult [email or web address] for contact info under Cardiology/STEMI.    Signed, Christell Faith, PA-C [place] Pager: [phone] 10/29/2019, 7:20 AM

## 2019-10-29 NOTE — Discharge Summary (Signed)
Physician Discharge Summary  [name] GHW:[id number] DOB: 04/05/1961 DOA: 10/27/2019  PCP: Mosie Lukes, MD  Admit date: 10/27/2019 Discharge date: 10/29/2019  Admitted From: home Disposition:  home  Recommendations for Outpatient Follow-up:  1. Follow up with PCP in 1-2 weeks 2. Please obtain BMP/CBC in one week 3. Please follow up with cardiology 4. Attend cardiac rehab 5. Please optimize glycemic control, maintain A1C < 7%  Home Health: None  Equipment/Devices: None   Discharge Condition: Stable  CODE STATUS: Full  Diet recommendation: Heart Healthy / Carb Modified    Brief/Interim Summary:  Frank Miltonis a 59 y.o.malewith medical history significant oftype 2 diabetes, hypertension, hyperlipidemia, colon cancer status post resection, psoriasis and psoriatic arthritis, insomnia presented to the ED via EMS today with chest pain. EKG showed anterior ST elevation, patient was taken emergently to Cath Lab or single stent was placed to LAD. Patient is post procedure and stable, but continues with 1-2 out of 10 chest pain. He reports he was doing some yard work, had lifted up his lawnmower when the chest pain started. States it did not let up with rest. States that because he has no health insurance currently, he waited to see if it would let up before coming to hospital. He reports otherwise being in relatively good health recently, no recent illnesses. Denies fevers or chills, shortness of breath, headache, abdominal pain, nausea vomiting or diarrhea, dysuria,focal weakness or numbness tingling or other recent complaints.  In the ED, HR 102, RR 24, initial BP 110/67, O2 sat 98% on room air. Labs were notable for hyperglycemia, initial troponin 783 and later >27,000. CBC with leukocytosis 17.4. EKG showed ST segment elevation in anterior leads.  Patient was taken emergently to the Cath Lab where a single stent was placed to the LAD.  Patient since stable and chest pain  has resolved.  Acute ST elevation myocardial infarction (STEMI) of anterior wall --Management per cardiology.  Echo showed EF 35-40%, consistent with HFrEF. --Continue dual platelet therapy uninterrupted x at least 1 year --Continue statin, beta-blocker and ARB, spironolactone --Aggressive treatment of risk factors - BP and glycemic control, weight loss, absolute smoking cessation --Follow-up with cardiology in 1-2 weeks  Diabetes mellitus type 2 in obese - uncontrolled, A1c 8.8%.  Home regimen is Basaglar 60 to 80 units at bedtime, Humalog 5 to 20 units 3 times daily with meals.  Close PCP follow up to optimize glycemic control.  Transaminitis -not present on admission, suspect shock liver in the setting of STEMI.  LFTs were normal on admission.  CMP's followed and LFT's improved.  Essential hypertension-home losartan continued  INSOMNIA, CHRONIC-trazodone prn during admisison  GERD-PPI continued  Depression with anxiety-on Lexapro and Valium continued  History of colon cancer-early stages, status post resection  Psoriatic arthritis ([place]) -stable.   Takes methotrexate but not sure what day of the week.   Obesity: Body mass index is 34.18 kg/m.  Complicating factor in above conditions  Discharge Diagnoses: Principal Problem:   Acute ST elevation myocardial infarction (STEMI) of anterior wall (HCC) Active Problems:   Diabetes mellitus type 2 in obese (HCC)   Essential hypertension   Obesity   INSOMNIA, CHRONIC   GERD   Depression with anxiety   History of colon cancer   Psoriatic arthritis [facility])    Discharge Instructions   Discharge Instructions    (HEART FAILURE PATIENTS) Call MD:  Anytime you have any of the following symptoms: 1) 3 pound weight gain in 24 hours or  5 pounds in 1 week 2) shortness of breath, with or without a dry hacking cough 3) swelling in the hands, feet or stomach 4) if you have to sleep on extra pillows at night in order to  breathe.   Complete by: As directed    AMB Referral to Cardiac Rehabilitation - Phase II   Complete by: As directed    Diagnosis:  STEMI Coronary Stents     After initial evaluation and assessments completed: Virtual Based Care may be provided alone or in conjunction with Phase 2 Cardiac Rehab based on patient barriers.: Yes   Call MD for:  extreme fatigue   Complete by: As directed    Call MD for:  persistant dizziness or light-headedness   Complete by: As directed    Call MD for:  severe uncontrolled pain   Complete by: As directed    Diet - low sodium heart healthy   Complete by: As directed    Discharge instructions   Complete by: As directed    Follow up with your PCP within 1 week of discharge. Please write down all blood sugar readings and # units insulin you are taking and bring to PCP appointment.  See cardiology for follow up and attend cardiac rehab.   Increase activity slowly   Complete by: As directed      Allergies as of 10/29/2019   No Known Allergies     Medication List    STOP taking these medications   lisinopril 5 MG tablet Commonly known as: ZESTRIL   methylPREDNISolone 4 MG tablet Commonly known as: Medrol   metoprolol succinate 25 MG 24 hr tablet Commonly known as: TOPROL-XL     TAKE these medications   aspirin 81 MG chewable tablet Chew 1 tablet (81 mg total) by mouth daily. Start taking on: October 30, 2019   atorvastatin 80 MG tablet Commonly known as: LIPITOR Take 1 tablet (80 mg total) by mouth daily at 6 PM. What changed:   medication strength  how much to take  when to take this   Basaglar KwikPen 100 UNIT/ML Inject 0.6 mLs (60 Units total) into the skin at bedtime. What changed:   how much to take  how to take this  when to take this  additional instructions   carvedilol 6.25 MG tablet Commonly known as: COREG Take 1 tablet (6.25 mg total) by mouth 2 (two) times daily with a meal.   clobetasol cream 0.05 % Commonly  known as: TEMOVATE   Cosentyx (300 MG Dose) 150 MG/ML Sosy Generic drug: Secukinumab Inject 300 mg into the skin once monthly.   diazepam 5 MG tablet Commonly known as: VALIUM TAKE 1 TABLET(5 MG) BY MOUTH AT BEDTIME AS NEEDED FOR ANXIETY   escitalopram 10 MG tablet Commonly known as: LEXAPRO Take 1 tablet (10 mg total) by mouth daily.   fenofibrate 160 MG tablet Take 1 tablet (160 mg total) by mouth daily.   folic acid 1 MG tablet Commonly known as: FOLVITE Take 1 tablet (1 mg total) by mouth daily.   glucose blood test strip Commonly known as: OneTouch Verio Test three times daily, DX E11.9   HYDROcodone-acetaminophen 5-325 MG tablet Commonly known as: Norco Take 1 tablet by mouth every 4 (four) hours as needed for moderate pain or severe pain.   insulin lispro 100 UNIT/ML KwikPen Commonly known as: HumaLOG KwikPen Inject 0.15 mLs (15 Units total) into the skin 3 (three) times daily with meals. What changed:   how  much to take  how to take this  when to take this  additional instructions   Insulin Pen Needle 31G X 8 MM Misc Commonly known as: B-D ULTRAFINE III SHORT PEN Test as directed three times daily.  DX E11.9   losartan 25 MG tablet Commonly known as: COZAAR Take 0.5 tablets (12.5 mg total) by mouth daily. Start taking on: October 30, 2019 What changed:   medication strength  how much to take  how to take this  when to take this  additional instructions   meloxicam 15 MG tablet Commonly known as: MOBIC Take 15 mg by mouth daily.   metFORMIN 1000 MG tablet Commonly known as: GLUCOPHAGE TAKE 1 TABLET (1,000 MG TOTAL) BY MOUTH 2 (TWO) TIMES DAILY WITH A MEAL.   methotrexate 2.5 MG tablet Commonly known as: RHEUMATREX Take 4 tablets (10 mg total) by mouth once a week. Caution:Chemotherapy. Protect from light.   mupirocin ointment 2 % Commonly known as: Bactroban Place 1 application into the nose 2 (two) times daily.   omeprazole 40 MG  capsule Commonly known as: PRILOSEC Take 1 capsule (40 mg total) by mouth 2 (two) times daily as needed.   OneTouch Delica Lancets Misc   OneTouch Verio w/Device Kit   Polyethyl Glycol-Propyl Glycol 0.4-0.3 % Soln Commonly known as: Systane Apply 2 drops to eye as needed. Every hour while awake   prasugrel 10 MG Tabs tablet Commonly known as: EFFIENT Take 1 tablet (10 mg total) by mouth daily. Start taking on: October 30, 2019   spironolactone 25 MG tablet Commonly known as: ALDACTONE Take 0.5 tablets (12.5 mg total) by mouth daily. Start taking on: October 30, 2019   zolpidem 10 MG tablet Commonly known as: AMBIEN TAKE 1 TABLET(10 MG) BY MOUTH AT BEDTIME AS NEEDED       No Known Allergies  Consultations:  cardiology   Procedures/Studies: CARDIAC CATHETERIZATION  Result Date: 10/27/2019  A drug-eluting stent was successfully placed using a [place] [id number].  Prox LAD to Mid LAD lesion is 100% stenosed.  Post intervention, there is a 0% residual stenosis.  1st Diag-1 lesion is 90% stenosed.  1st Diag-2 lesion is 80% stenosed.  1.  Codominant coronary arteries with thrombotic occlusion of the proximal LAD which is the culprit for anterior ST elevation myocardial infarction.  No other obstructive disease. 2.  Left ventricular angiography was not performed due to severely elevated left ventricular end-diastolic pressure at 34 mmHg 3.  Successful angioplasty and drug-eluting stent placement to the proximal LAD. Recommendations: We will use Aggrastat infusion for 18 hours due to residual thrombus in first diagonal. Dual antiplatelet therapy for at least 1 year. Aggressive treatment of risk factors. The patient was given 1 dose of IV furosemide 40 mg once daily before he left the Cath Lab.  I do not expect him to require more diuresis but can be followed clinically. I ordered an echocardiogram.   ECHOCARDIOGRAM COMPLETE  Result Date: 10/28/2019    ECHOCARDIOGRAM REPORT    Patient Name:   [name] Date of Exam: 10/28/2019 Medical Rec #:  [id number]      Height:       67.0 in Accession #:    [id number]     Weight:       218.3 lb Date of Birth:  [birth year]      BSA:          2.099 m Patient Age:    59 years  BP:           109/73 mmHg Patient Gender: M              HR:           100 bpm. Exam Location:  [facility] Procedure: 2D Echo Indications:     ACUTE MYOCARDIAL INFARCTION 410  History:         Patient has prior history of Echocardiogram examinations, most                  recent 12/26/2017. Risk Factors:Diabetes, Hypertension and                  Dyslipidemia.  Sonographer:     Avanell Shackleton Referring Phys:  [id number] PZWCHENI A ARIDA Diagnosing Phys: Kate Sable MD IMPRESSIONS  1. Left ventricular ejection fraction, by estimation, is 35 to 40%. The left ventricle has moderately decreased function. The left ventricle demonstrates regional wall motion abnormalities (see scoring diagram/findings for description). Left ventricular  diastolic parameters are consistent with Grade I diastolic dysfunction (impaired relaxation). There is severe hypokinesis of the left ventricular, entire anteroseptal wall and anterior wall.  2. Right ventricular systolic function is normal. The right ventricular size is normal.  3. The mitral valve is normal in structure. No evidence of mitral valve regurgitation.  4. The aortic valve is normal in structure. Aortic valve regurgitation is not visualized.  5. The inferior vena cava is dilated in size with >50% respiratory variability, suggesting right atrial pressure of 8 mmHg. FINDINGS  Left Ventricle: No evidence for LV thrombus noted. Left ventricular ejection fraction, by estimation, is 35 to 40%. The left ventricle has moderately decreased function. The left ventricle demonstrates regional wall motion abnormalities. Severe hypokinesis of the left ventricular, entire anteroseptal wall and anterior wall. Definity contrast agent was given IV to  delineate the left ventricular endocardial borders. The left ventricular internal cavity size was normal in size. There is no left ventricular hypertrophy. Left ventricular diastolic parameters are consistent with Grade I diastolic dysfunction (impaired relaxation). Right Ventricle: The right ventricular size is normal. Right vetricular wall thickness was not assessed. Right ventricular systolic function is normal. Left Atrium: Left atrial size was normal in size. Right Atrium: Right atrial size was normal in size. Pericardium: Trivial pericardial effusion is present. Mitral Valve: The mitral valve is normal in structure. No evidence of mitral valve regurgitation. Tricuspid Valve: The tricuspid valve is normal in structure. Tricuspid valve regurgitation is trivial. Aortic Valve: The aortic valve is normal in structure. Aortic valve regurgitation is not visualized. Pulmonic Valve: The pulmonic valve was not well visualized. Pulmonic valve regurgitation is not visualized. Aorta: The aortic root is normal in size and structure. Venous: The inferior vena cava is dilated in size with greater than 50% respiratory variability, suggesting right atrial pressure of 8 mmHg. IAS/Shunts: No atrial level shunt detected by color flow Doppler.  LEFT VENTRICLE PLAX 2D LVIDd:         4.65 cm  Diastology LVIDs:         3.28 cm  LV e' lateral:   0.07 cm/s LV PW:         0.83 cm  LV E/e' lateral: 6.7 LV IVS:        1.71 cm  LV e' medial:    0.05 cm/s LVOT diam:     1.90 cm  LV E/e' medial:  9.3 LVOT Area:     2.84 cm  RIGHT VENTRICLE RV Mid  diam:    2.91 cm LEFT ATRIUM             Index LA diam:        4.10 cm 1.95 cm/m LA Vol (A2C):   88.1 ml 41.98 ml/m LA Vol (A4C):   38.0 ml 18.11 ml/m LA Biplane Vol: 57.8 ml 27.54 ml/m   AORTA Ao Root diam: 3.70 cm MITRAL VALVE MV Area (PHT): 5.27 cm    SHUNTS MV Decel Time: 144 msec    Systemic Diam: 1.90 cm MV E velocity: 0.46 cm/s MV A velocity: 63.40 cm/s MV E/A ratio:  0.01 Kate Sable MD Electronically signed by Kate Sable MD Signature Date/Time: 10/28/2019/1:46:03 PM    Final        Subjective: Patient seen walking hall this AM. Denies chest pain.  Expresses stress about family dynamics, obtaining job and insurance.  Provided reassurance.  He denies any acute complaints and agrees to close follow ups with PCP and cardiology.  SW provided [facility] referral and medications to Medication Management.   Discharge Exam: Vitals:   10/29/19 0343 10/29/19 0816  BP: 120/60 125/89  Pulse: (!) 104 (!) 105  Resp:    Temp: 98.2 F (36.8 C) 98.7 F (37.1 C)  SpO2: 98% 97%   Vitals:   10/28/19 1836 10/28/19 1951 10/29/19 0343 10/29/19 0816  BP: 117/78 119/81 120/60 125/89  Pulse: (!) 110 (!) 108 (!) 104 (!) 105  Resp: 20     Temp: 98.4 F (36.9 C) 99.1 F (37.3 C) 98.2 F (36.8 C) 98.7 F (37.1 C)  TempSrc: Oral Oral Oral Oral  SpO2: 96% 99% 98% 97%  Weight: 98.4 kg  99.4 kg   Height: '5\' 7"'  (1.702 m)       General: Pt is alert, awake, not in acute distress Cardiovascular: RRR, S1/S2 +, no rubs, no gallops Respiratory: CTA bilaterally, no wheezing, no rhonchi Abdominal: Soft, NT, ND, bowel sounds + Extremities: no edema, no cyanosis    The results of significant diagnostics from this hospitalization (including imaging, microbiology, ancillary and laboratory) are listed below for reference.     Microbiology: Recent Results (from the past 240 hour(s))  Respiratory Panel by RT PCR (Flu A&B, Covid) - Nasopharyngeal Swab     Status: None   Collection Time: 10/27/19  2:06 PM   Specimen: Nasopharyngeal Swab  Result Value Ref Range Status   SARS Coronavirus 2 by RT PCR NEGATIVE NEGATIVE Final    Comment: (NOTE) SARS-CoV-2 target nucleic acids are NOT DETECTED. The SARS-CoV-2 RNA is generally detectable in upper respiratoy specimens during the acute phase of infection. The lowest concentration of SARS-CoV-2 viral copies this assay can  detect is 131 copies/mL. A negative result does not preclude SARS-Cov-2 infection and should not be used as the sole basis for treatment or other patient management decisions. A negative result may occur with  improper specimen collection/handling, submission of specimen other than nasopharyngeal swab, presence of viral mutation(s) within the areas targeted by this assay, and inadequate number of viral copies (<131 copies/mL). A negative result must be combined with clinical observations, patient history, and epidemiological information. The expected result is Negative. Fact Sheet for Patients:  PinkCheek.be Fact Sheet for Healthcare Providers:  GravelBags.it This test is not yet ap proved or cleared by the [place] FDA and  has been authorized for detection and/or diagnosis of SARS-CoV-2 by FDA under an Emergency Use Authorization (EUA). This EUA will remain  in effect (meaning this  test can be used) for the duration of the COVID-19 declaration under Section 564(b)(1) of the Act, 21 U.S.C. section 360bbb-3(b)(1), unless the authorization is terminated or revoked sooner.    Influenza A by PCR NEGATIVE NEGATIVE Final   Influenza B by PCR NEGATIVE NEGATIVE Final    Comment: (NOTE) The Xpert Xpress SARS-CoV-2/FLU/RSV assay is intended as an aid in  the diagnosis of influenza from Nasopharyngeal swab specimens and  should not be used as a sole basis for treatment. Nasal washings and  aspirates are unacceptable for Xpert Xpress SARS-CoV-2/FLU/RSV  testing. Fact Sheet for Patients: PinkCheek.be Fact Sheet for Healthcare Providers: GravelBags.it This test is not yet approved or cleared by the [place] FDA and  has been authorized for detection and/or diagnosis of SARS-CoV-2 by  FDA under an Emergency Use Authorization (EUA). This EUA will remain  in effect (meaning  this test can be used) for the duration of the  Covid-19 declaration under Section 564(b)(1) of the Act, 21  U.S.C. section 360bbb-3(b)(1), unless the authorization is  terminated or revoked. Performed at [facility], [address] [place], [place] [address]   MRSA PCR Screening     Status: None   Collection Time: 10/27/19  3:45 PM   Specimen: Nasopharyngeal  Result Value Ref Range Status   MRSA by PCR NEGATIVE NEGATIVE Final    Comment:        The GeneXpert MRSA Assay (FDA approved for NASAL specimens only), is one component of a comprehensive MRSA colonization surveillance program. It is not intended to diagnose MRSA infection nor to guide or monitor treatment for MRSA infections. Performed at [facility], [address] [place], [place] [address]      Labs: BNP (last 3 results) No results for input(s): BNP in the last 8760 hours. Basic Metabolic Panel: Recent Labs  Lab 10/27/19 1406 10/27/19 1725 10/28/19 0526 10/29/19 0533  NA 137  --  136 135  K 3.5 3.5 3.1* 3.7  CL 105  --  102 105  CO2 21*  --  23 22  GLUCOSE 273*  --  278* 314*  BUN 8  --  9 15  CREATININE 0.70  --  0.65 0.78  CALCIUM 8.7*  --  8.4* 8.1*  MG  --  1.9  --  2.0   Liver Function Tests: Recent Labs  Lab 10/27/19 1406 10/28/19 0526 10/29/19 0533  AST 27 282* 87*  ALT 18 87* 65*  ALKPHOS 75 66 61  BILITOT 1.6* 1.6* 1.2  PROT 7.5 6.5 6.0*  ALBUMIN 3.9 3.2* 2.9*   No results for input(s): LIPASE, AMYLASE in the last 168 hours. No results for input(s): AMMONIA in the last 168 hours. CBC: Recent Labs  Lab 10/27/19 1406 10/28/19 0526 10/29/19 0533  WBC 17.4* 17.3* 13.2*  NEUTROABS 13.8*  --   --   HGB 14.8 13.1 12.0*  HCT 42.7 38.1* 35.7*  MCV 93.8 92.7 96.5  PLT 433* 385 315   Cardiac Enzymes: No results for input(s): CKTOTAL, CKMB, CKMBINDEX, TROPONINI in the last 168 hours. BNP: Invalid input(s): POCBNP CBG: Recent Labs  Lab 10/28/19 1130  10/28/19 1628 10/28/19 2101 10/29/19 0953 10/29/19 1210  GLUCAP 257* 299* 239* 292* 270*   D-Dimer No results for input(s): DDIMER in the last 72 hours. Hgb A1c No results for input(s): HGBA1C in the last 72 hours. Lipid Profile Recent Labs    10/27/19 1406  CHOL 221*  HDL 31*  LDLCALC UNABLE TO  CALCULATE IF TRIGLYCERIDE OVER 400 mg/dL  TRIG 453*  CHOLHDL 7.1  LDLDIRECT 112.7*   Thyroid function studies No results for input(s): TSH, T4TOTAL, T3FREE, THYROIDAB in the last 72 hours.  Invalid input(s): FREET3 Anemia work up No results for input(s): VITAMINB12, FOLATE, FERRITIN, TIBC, IRON, RETICCTPCT in the last 72 hours. Urinalysis    Component Value Date/Time   COLORURINE Straw 10/27/2014 1429   COLORURINE YELLOW 10/02/2012 1041   APPEARANCEUR Clear 10/27/2014 1429   LABSPEC 1.033 10/27/2014 1429   PHURINE 6.0 10/27/2014 1429   PHURINE 7.0 10/02/2012 1041   GLUCOSEU >=500 10/27/2014 1429   HGBUR Negative 10/27/2014 1429   HGBUR NEG 10/02/2012 1041   BILIRUBINUR Negative 10/27/2014 1429   KETONESUR Trace 10/27/2014 1429   KETONESUR NEG 10/02/2012 1041   PROTEINUR Negative 10/27/2014 1429   PROTEINUR NEG 10/02/2012 1041   UROBILINOGEN 0.2 10/02/2012 1041   NITRITE Negative 10/27/2014 1429   NITRITE NEG 10/02/2012 1041   LEUKOCYTESUR Negative 10/27/2014 1429   Sepsis Labs Invalid input(s): PROCALCITONIN,  WBC,  LACTICIDVEN Microbiology Recent Results (from the past 240 hour(s))  Respiratory Panel by RT PCR (Flu A&B, Covid) - Nasopharyngeal Swab     Status: None   Collection Time: 10/27/19  2:06 PM   Specimen: Nasopharyngeal Swab  Result Value Ref Range Status   SARS Coronavirus 2 by RT PCR NEGATIVE NEGATIVE Final    Comment: (NOTE) SARS-CoV-2 target nucleic acids are NOT DETECTED. The SARS-CoV-2 RNA is generally detectable in upper respiratoy specimens during the acute phase of infection. The lowest concentration of SARS-CoV-2 viral copies this assay can  detect is 131 copies/mL. A negative result does not preclude SARS-Cov-2 infection and should not be used as the sole basis for treatment or other patient management decisions. A negative result may occur with  improper specimen collection/handling, submission of specimen other than nasopharyngeal swab, presence of viral mutation(s) within the areas targeted by this assay, and inadequate number of viral copies (<131 copies/mL). A negative result must be combined with clinical observations, patient history, and epidemiological information. The expected result is Negative. Fact Sheet for Patients:  PinkCheek.be Fact Sheet for Healthcare Providers:  GravelBags.it This test is not yet ap proved or cleared by the [place] FDA and  has been authorized for detection and/or diagnosis of SARS-CoV-2 by FDA under an Emergency Use Authorization (EUA). This EUA will remain  in effect (meaning this test can be used) for the duration of the COVID-19 declaration under Section 564(b)(1) of the Act, 21 U.S.C. section 360bbb-3(b)(1), unless the authorization is terminated or revoked sooner.    Influenza A by PCR NEGATIVE NEGATIVE Final   Influenza B by PCR NEGATIVE NEGATIVE Final    Comment: (NOTE) The Xpert Xpress SARS-CoV-2/FLU/RSV assay is intended as an aid in  the diagnosis of influenza from Nasopharyngeal swab specimens and  should not be used as a sole basis for treatment. Nasal washings and  aspirates are unacceptable for Xpert Xpress SARS-CoV-2/FLU/RSV  testing. Fact Sheet for Patients: PinkCheek.be Fact Sheet for Healthcare Providers: GravelBags.it This test is not yet approved or cleared by the [place] FDA and  has been authorized for detection and/or diagnosis of SARS-CoV-2 by  FDA under an Emergency Use Authorization (EUA). This EUA will remain  in effect (meaning  this test can be used) for the duration of the  Covid-19 declaration under Section 564(b)(1) of the Act, 21  U.S.C. section 360bbb-3(b)(1), unless the authorization is  terminated or revoked. Performed at [facility], [address] [place], [place] [address]   MRSA PCR Screening     Status: None   Collection Time: 10/27/19  3:45 PM   Specimen: Nasopharyngeal  Result Value Ref Range Status   MRSA by PCR NEGATIVE NEGATIVE Final    Comment:        The GeneXpert MRSA Assay (FDA approved for NASAL specimens only), is one component of a comprehensive MRSA colonization surveillance program. It is not intended to diagnose MRSA infection nor to guide or monitor treatment for MRSA infections. Performed at [facility], [address] [place], [place] [address]      Time coordinating discharge: Over 30 minutes  SIGNED:   Ezekiel Slocumb, DO [facility] 10/29/2019, 12:30 PM   If 7PM-7AM, please contact night-coverage [email or web address]

## 2019-10-29 NOTE — Plan of Care (Signed)
Nutrition Education Note  RD consulted for nutrition education regarding NSTEMI and DM.  RD provided "Heart Healthy/Carbohydrate Controlled diet" handout from the Academy of [employer] and Dietetics. Reviewed patient's dietary recall. Provided examples on ways to decrease sodium and fat intake in diet. Discouraged intake of processed foods and use of salt shaker. Encouraged fresh fruits and vegetables as well as whole grain sources of carbohydrates to maximize fiber intake.   RD also discussed different food groups and their effects on blood sugar, emphasizing carbohydrate-containing foods. Provided list of carbohydrates and recommended serving sizes of common foods.  Discussed importance of controlled and consistent carbohydrate intake throughout the day. Provided examples of ways to balance meals/snacks and encouraged intake of high-fiber, whole grain complex carbohydrates.   Teach back method used.  Expect good compliance.  Body mass index is 34.18 kg/m. Pt meets criteria for based on current BMI.  Current diet order is CHO controlled, patient is consuming approximately 100% of meals at this time. Labs and medications reviewed. No further nutrition interventions warranted at this time. RD contact information provided. If additional nutrition issues arise, please re-consult RD.   Koleen Distance MS, RD, LDN Please refer to [facility] for RD and/or RD on-call/weekend/after hours pager

## 2019-10-29 NOTE — Progress Notes (Signed)
Inpatient Diabetes Program Recommendations  AACE/ADA: New Consensus Statement on Inpatient Glycemic Control   Target Ranges:  Prepandial:   less than 140 mg/dL      Peak postprandial:   less than 180 mg/dL (1-2 hours)      Critically ill patients:  140 - 180 mg/dL  Results for [name], [name] (MRN [id number]) as of 10/29/2019 13:57  Ref. Range 10/28/2019 07:53 10/28/2019 11:30 10/28/2019 16:28 10/28/2019 21:01 10/29/2019 09:53 10/29/2019 12:10  Glucose-Capillary Latest Ref Range: 70 - 99 mg/dL 263 (H) 257 (H) 299 (H) 239 (H) 292 (H) 270 (H)    Review of Glycemic Control  Diabetes history: DM2 Outpatient Diabetes medications: Novolog as needed based on glucose, was taking Lantus but has been out of Lantus a while Current orders for Inpatient glycemic control: Lantus 40 units daily, Novolog 6 units TID with meals, Novolog 0-9 units TID with meals, Novolog 0-5 units QHS  NOTE: In reviewing chart, noted patient has no insurance and has not been taking insulin consistently. Spoke with patient and his ex-girlfriend (at patient's request to speak with him while she was present) about diabetes and home regimen for diabetes control. Patient reports that he has been unemployed since [place] pandemic started and he has not had any insurance. Patient states that his PCP has been helping with diabetes management; however, he has not been able to see PCP in a while due to cost. Patient states that recently he has been sending messages through the chart to his PCP when he has an issue.  Patient reports that he was taking Lantus and Novolog (pens) for DM control but he has been out of Lantus for a while so he has been taking Novolog sporadically trying to make it last longer. Patient states that he has not had an A1C checked in a while.  Informed patient that an A1C has been ordered and still in process at this time.  Patient states that he has some testing supplies at home but notes that he does not consistently check  glucose. Informed patient about Reli-On Prime glucometer ($9 at [facility]) and box of Reli-On test strips ($9 at [facility]) in case his other test strips are expensive.  Patient admits that he was not taking care of himself and he states that the heart attack has been an eye-opener and he plans to start doing what he needs to do to get DM under control.  Discussed glucose and A1C goals. Discussed importance of checking CBGs and maintaining good CBG control to prevent long-term and short-term complications. Explained how hyperglycemia leads to damage within blood vessels which lead to the common complications seen with uncontrolled diabetes. Stressed to the patient the importance of improving glycemic control to prevent further complications from uncontrolled diabetes. Discussed impact of nutrition, exercise, stress, sickness, and medications on diabetes control. Patient states that he does not really understand what he needs to be looking at to help guide him about what he should eat. Patient also notes that he has lots of stress in his life.  Discussed carbohydrates, carbohydrate goals per day and meal, along with portion sizes. Informed patient that a Living Well with DM book would also be ordered; encouraged patient to read entire book once received. Patient requested to talk with RD if possible while inpatient. Ordered RD consult for diet education prior to discharge. Discussed how stress increases glucose and encouraged patient to try to reduce stress and find ways to help him relax. Discussed [facility] and informed patient that [facility] is consulted already and should be talking with him about medication needs and follow up. Encouraged patient to fill out application for [facility] once received so he can submit it to [facility] when he goes to get discharge medications.  Patient appreciative of information discussed and would  like to utilize any resources that are available. Patient is very concerned about the cost of his hospital bill and would like to talk with a financial councilor before he is discharged.  Encouraged patient to check glucose as directed by MD, take medications consistently, and follow up consistently with provider that can help him get DM under control.  Patient verbalized understanding of information discussed and reports no further questions at this time related to diabetes.  Thanks, Barnie Alderman, RN, MSN, CDE Diabetes Coordinator Inpatient Diabetes Program [phone] (Team Pager)

## 2019-10-29 NOTE — Discharge Instructions (Signed)
Heart Attack A heart attack occurs when blood and oxygen supply to the heart is cut off. A heart attack causes damage to the heart that cannot be fixed. A heart attack is also called a myocardial infarction, or MI. If you think you are having a heart attack, do not wait to see if the symptoms will go away. Get medical help right away. What are the causes? This condition may be caused by:  A fatty substance (plaque) in the blood vessels (arteries). This can block the flow of blood to the heart.  A blood clot in the blood vessels that go to the heart. The blood clot blocks blood flow.  Low blood pressure.  An abnormal heartbeat.  Some diseases, such as problems in red blood cells (anemia)orproblems in breathing (respiratory failure).  Tightening (spasm) of a blood vessel that cuts off blood to the heart.  A tear in a blood vessel of the heart.  High blood pressure. What increases the risk? The following factors may make you more likely to develop this condition:  Aging. The older you are, the higher your risk.  Having a personal or family history of chest pain, heart attack, stroke, or narrowing of the arteries in the legs, arms, head, or stomach (peripheral artery disease).  Being male.  Smoking.  Not getting regular exercise.  Being overweight or obese.  Having high blood pressure.  Having high cholesterol.  Having diabetes.  Drinking too much alcohol.  Using illegal drugs, such as cocaine or methamphetamine. What are the signs or symptoms? Symptoms of this condition include:  Chest pain. It may feel like: ? Crushing or squeezing. ? Tightness, pressure, fullness, or heaviness.  Pain in the arm, neck, jaw, back, or upper body.  Shortness of breath.  Heartburn.  Upset stomach (indigestion).  Feeling like you may vomit (nauseous).  Cold sweats.  Feeling tired.  Sudden light-headedness. How is this treated? A heart attack must be treated as soon as  possible. Treatment may include:  Medicines to: ? Break up or dissolve blood clots. ? Thin blood and help prevent blood clots. ? Treat blood pressure. ? Improve blood flow to the heart. ? Reduce pain. ? Reduce cholesterol.  Procedures to widen a blocked artery and keep it open.  Open heart surgery.  Receiving oxygen.  Making your heart strong again (cardiac rehabilitation) through exercise, education, and counseling. Follow these instructions at home: Medicines  Take over-the-counter and prescription medicines only as told by your doctor. You may need to take medicine: ? To keep your blood from clotting too easily. ? To control blood pressure. ? To lower cholesterol. ? To control heart rhythms.  Do not take these medicines unless your doctor says it is okay: ? NSAIDs, such as ibuprofen. ? Supplements that have vitamin A, vitamin E, or both. ? Hormone replacement therapy that has estrogen with or without progestin. Lifestyle      Do not use any products that have nicotine or tobacco, such as cigarettes, e-cigarettes, and chewing tobacco. If you need help quitting, ask your doctor.  Avoid secondhand smoke.  Exercise regularly. Ask your doctor about a cardiac rehab program.  Eat heart-healthy foods. Your doctor will tell you what foods to eat.  Stay at a healthy weight.  Lower your stress level.  Do not use illegal drugs. Alcohol use  Do not drink alcohol if: ? Your doctor tells you not to drink. ? You are pregnant, may be pregnant, or are planning to become pregnant.    If you drink alcohol: ? Limit how much you use to:  0-1 drink a day for women.  0-2 drinks a day for men. ? Know how much alcohol is in your drink. In the [place] one drink equals one 12 oz bottle of beer (355 mL), one 5 oz glass of wine (148 mL), or one 1 oz glass of hard liquor (44 mL). General instructions  Work with your doctor to treat other problems you may have, such as diabetes or high  blood pressure.  Get screened for depression. Get treatment if needed.  Keep your vaccines up to date. Get the flu shot (influenza vaccine) every year.  Keep all follow-up visits as told by your doctor. This is important. Contact a doctor if:  You feel very sad.  You have trouble doing your daily activities. Get help right away if:  You have sudden, unexplained discomfort in your chest, arms, back, neck, jaw, or upper body.  You have shortness of breath.  You have sudden sweating or clammy skin.  You feel like you may vomit.  You vomit.  You feel tired or weak.  You get light-headed or dizzy.  You feel your heart beating fast.  You feel your heart skipping beats.  You have blood pressure that is higher than 180/120. These symptoms may be an emergency. Do not wait to see if the symptoms will go away. Get medical help right away. Call your local emergency services (911 in the [place] Do not drive yourself to the hospital. Summary  A heart attack occurs when blood and oxygen supply to the heart is cut off.  Do not take NSAIDs unless your doctor says it is okay.  Do not smoke. Avoid secondhand smoke.  Exercise regularly. Ask your doctor about a cardiac rehab program. This information is not intended to replace advice given to you by your health care provider. Make sure you discuss any questions you have with your health care provider. Document Revised: 10/16/2018 Document Reviewed: 10/16/2018 Elsevier Patient Education  2020 Elsevier Inc.  

## 2019-10-29 NOTE — Telephone Encounter (Signed)
-----   Message from [name], MD sent at 10/29/2019  1:27 PM EDT ----- Patient is being discharged from [facility] today after anterior STEMI.  TCM follow-up needed with me or APP in 1 to 2 weeks.

## 2019-10-29 NOTE — Telephone Encounter (Signed)
TCM....  Patient is being discharged      They are scheduled to see Mickle Plumb 4/19 at 930 am   They were seen for NSTEMI  They need to be seen within 1-2 wks     Please call

## 2019-10-29 NOTE — Progress Notes (Signed)
Pt got off the phone with his sister and was very upset.  He talked of his "dysfunctional family" for a few minutes until he seemed really upset.  Emotional support offered and pt instructed to ly down and try not to let his siblings get to him, that he needs to rest and not stress so much.  Pt verbalized understand, went back to bed.

## 2019-10-29 NOTE — Progress Notes (Signed)
Patient has had no complaints of pain this shift. Pt ambulated for 15 minute periods 3 times this shift without shortness of breath, chest pain, or change in vital signs. Pt has been discharged to home. Pt's 2 IV's and telemetry were removed. Discharge instructions and presciption pick up instructions given to patient. Heart attack care education packet and Living Well With Diabetes Book given to patient. Pt left the unit via wheelchair with nurse aide and with his designated visitors.

## 2019-10-30 ENCOUNTER — Telehealth: Payer: Self-pay | Admitting: *Deleted

## 2019-10-30 LAB — HEMOGLOBIN A1C
Hgb A1c MFr Bld: 8.8 % — ABNORMAL HIGH (ref 4.8–5.6)
Mean Plasma Glucose: 206 mg/dL

## 2019-10-30 NOTE — Telephone Encounter (Signed)
Transition Care Management Follow-up Telephone Call   Date discharged?10/29/19   How have you been since you were released from the hospital? Doing great   Do you understand why you were in the hospital? yes   Do you understand the discharge instructions? yes   Where were you discharged to? home   Items Reviewed:  Medications reviewed: " I have all of my meds and understand them."  Allergies reviewed: yes  Dietary changes reviewed: yes  Referrals reviewed: yes   Functional Questionnaire:   Activities of Daily Living (ADLs):   He states they are independent in the following: ambulation, bathing and hygiene, feeding, continence, grooming, toileting and dressing States they require assistance with the following: na   Any transportation issues/concerns?: no   Any patient concerns? Lack of insurance.   Confirmed importance and date/time of follow-up visits scheduled yes  Pt declines scheduling appt w/ PCP at this time. States he does not currently have insurance. Pt does have cardiology appt scheduled for 11/05/19.  Confirmed with patient if condition begins to worsen call PCP or go to the ER.  Patient was given the office number and encouraged to call back with question or concerns.  : yes

## 2019-10-30 NOTE — Telephone Encounter (Signed)
Patient contacted regarding discharge from [facility] on 10/29/19.  Patient understands to follow up with provider Marrianne Mood, PA on 11/05/19 at 9:30am at [facility] office. Patient understands discharge instructions? yes Patient understands medications and regiment? yes Patient understands to bring all medications to this visit? yes  Patient has no questions or concerns at this time. Patient voiced appreciation for the call.

## 2019-11-05 ENCOUNTER — Ambulatory Visit (INDEPENDENT_AMBULATORY_CARE_PROVIDER_SITE_OTHER): Payer: Commercial Managed Care - PPO | Admitting: Physician Assistant

## 2019-11-05 ENCOUNTER — Telehealth: Payer: Self-pay | Admitting: Physician Assistant

## 2019-11-05 ENCOUNTER — Encounter: Payer: Self-pay | Admitting: Physician Assistant

## 2019-11-05 ENCOUNTER — Emergency Department: Payer: Commercial Managed Care - PPO

## 2019-11-05 ENCOUNTER — Other Ambulatory Visit: Payer: Self-pay

## 2019-11-05 ENCOUNTER — Emergency Department
Admission: EM | Admit: 2019-11-05 | Discharge: 2019-11-05 | Disposition: A | Discharge status: Home / Self Care | Payer: Commercial Managed Care - PPO | Attending: Emergency Medicine | Admitting: Emergency Medicine

## 2019-11-05 VITALS — BP 130/64 | HR 102 | Ht 66.0 in | Wt 222.0 lb

## 2019-11-05 DIAGNOSIS — Z5321 Procedure and treatment not carried out due to patient leaving prior to being seen by health care provider: Secondary | ICD-10-CM | POA: Insufficient documentation

## 2019-11-05 DIAGNOSIS — I10 Essential (primary) hypertension: Secondary | ICD-10-CM | POA: Diagnosis not present

## 2019-11-05 DIAGNOSIS — Z720 Tobacco use: Secondary | ICD-10-CM

## 2019-11-05 DIAGNOSIS — E785 Hyperlipidemia, unspecified: Secondary | ICD-10-CM | POA: Diagnosis not present

## 2019-11-05 DIAGNOSIS — I252 Old myocardial infarction: Secondary | ICD-10-CM | POA: Diagnosis not present

## 2019-11-05 DIAGNOSIS — R0789 Other chest pain: Secondary | ICD-10-CM | POA: Diagnosis not present

## 2019-11-05 DIAGNOSIS — Z9114 Patient's other noncompliance with medication regimen: Secondary | ICD-10-CM

## 2019-11-05 LAB — BASIC METABOLIC PANEL
Anion gap: 8 (ref 5–15)
BUN: 15 mg/dL (ref 6–20)
CO2: 24 mmol/L (ref 22–32)
Calcium: 8.9 mg/dL (ref 8.9–10.3)
Chloride: 106 mmol/L (ref 98–111)
Creatinine, Ser: 0.75 mg/dL (ref 0.61–1.24)
GFR calc Af Amer: >60 mL/min (ref >60)
GFR calc non Af Amer: >60 mL/min (ref >60)
Glucose, Bld: 211 mg/dL — ABNORMAL HIGH (ref 70–99)
Potassium: 3.9 mmol/L (ref 3.5–5.1)
Sodium: 138 mmol/L (ref 135–145)

## 2019-11-05 LAB — CBC
HCT: 37.2 % — ABNORMAL LOW (ref 39.0–52.0)
Hemoglobin: 12.7 g/dL — ABNORMAL LOW (ref 13.0–17.0)
MCH: 32.7 pg (ref 26.0–34.0)
MCHC: 34.1 g/dL (ref 30.0–36.0)
MCV: 95.9 fL (ref 80.0–100.0)
Platelets: 321 10*3/uL (ref 150–400)
RBC: 3.88 MIL/uL — ABNORMAL LOW (ref 4.22–5.81)
RDW: 13.2 % (ref 11.5–15.5)
WBC: 11.8 10*3/uL — ABNORMAL HIGH (ref 4.0–10.5)
nRBC: 0 % (ref 0.0–0.2)

## 2019-11-05 LAB — TROPONIN I (HIGH SENSITIVITY): Troponin I (High Sensitivity): 1471 ng/L (ref <18)

## 2019-11-05 MED ORDER — SODIUM CHLORIDE 0.9% FLUSH: 3.0000 mL | Freq: Once | INTRAVENOUS | Status: DC

## 2019-11-05 NOTE — Patient Instructions (Signed)
Medication Instructions:  - Your physician recommends that you continue on your current medications as directed. Please refer to the Current Medication list given to you today.  *If you need a refill on your cardiac medications before your next appointment, please call your pharmacy*   Lab Work: - none ordered  If you have labs (blood work) drawn today and your tests are completely normal, you will receive your results only by: Marland Kitchen MyChart Message (if you have MyChart) OR . A paper copy in the mail If you have any lab test that is abnormal or we need to change your treatment, we will call you to review the results.   Testing/Procedures: -none ordered   Follow-Up: At [facility], you and your health needs are our priority.  As part of our continuing mission to provide you with exceptional heart care, we have created designated Provider Care Teams.  These Care Teams include your primary Cardiologist (physician) and Advanced Practice Providers (APPs -  Physician Assistants and Nurse Practitioners) who all work together to provide you with the care you need, when you need it.  We recommend signing up for the patient portal called "MyChart".  Sign up information is provided on this After Visit Summary.  MyChart is used to connect with patients for Virtual Visits (Telemedicine).  Patients are able to view lab/test results, encounter notes, upcoming appointments, etc.  Non-urgent messages can be sent to your provider as well.   To learn more about what you can do with MyChart, go to [email or web address].    Your next appointment:   Patient declined   The format for your next appointment:   n/a  Provider:    You may see Kathlyn Sacramento, MD or one of the following Advanced Practice Providers on your designated Care Team:    Murray Hodgkins, NP  Christell Faith, PA-C  Marrianne Mood, PA-C    Other Instructions - Please feel free to contact us with how you are doing or if you feel  you have any cardiac concerns that may require further follow up.

## 2019-11-05 NOTE — ED Notes (Signed)
Pt ambulatory to STAT desk without difficulty or distress noted; pt upset over wait time and wanting to leave; explained to pt importance of staying and being evaluated further based on his hx c/o; pt reports "well you have 28minutes to get me back and then I'm leaving"

## 2019-11-05 NOTE — Progress Notes (Signed)
Office Visit    Patient Name: [name] Date of Encounter: 11/05/2019  Primary Care Provider:  Mosie Lukes, MD Primary Cardiologist:  No primary care provider on file.  Chief Complaint    Chief Complaint  Patient presents with  . Other    Paitent c.o VERY fatigue and weak. Meds reviewed verbally with paitent.     59 year old male with history of CAD, anterior STEMI s/p PCI to mid LAD, hypertension, hyperlipidemia, and tobacco use, and recently admitted with anterior ST elevation myocardial infarction 10/27/2019 s/p emergent LHC with PCI/DES to mid LAD and here for cardiac catheterization follow-up.  Past Medical History    Past Medical History:  Diagnosis Date  . Anemia 10/03/2009   Qualifier: Diagnosis of  By: [name]   . Anxiety associated with depression 10/11/2013  . Colon cancer ([place])    right colon cancer- adenocarcinoma CEA level isnrmal at 1.8  . Diabetes mellitus   . Diabetes mellitus type 2 in obese [facility]) 03/20/2010   Qualifier: Diagnosis of  By: [name]    . Erectile dysfunction 06/22/2016  . Essential hypertension 06/05/2014  . FATTY LIVER DISEASE 11/04/2009   Qualifier: Diagnosis of  By: [name] MD, Docia Chuck   Qualifier: Diagnosis of  By: [name] MD, Docia Chuck  Last Assessment & Plan:  conirmed by CT scan of abdomen in April 2016 encouraged to minimize simple carbs and fatty foods.  Marland Kitchen GERD 11/04/2009   Qualifier: Diagnosis of  By: [name] MD, Docia Chuck   . Great toe pain, right 04/18/2017  . Hepatic artery stenosis ([place])   . History of colon cancer 01/02/2010   Qualifier: Diagnosis of  By: [name] Dr [name]  Last Assessment & Plan:  Follows closely with gastroenterology, Dr [name]  . Hyperlipidemia   . Hyperlipidemia, mixed 09/07/2010   Qualifier: Diagnosis of  By: [name]   . Hypertension   . Hypertriglyceridemia 12/14/2010  . Hypotestosteronism 06/22/2016  . INSOMNIA, CHRONIC 03/20/2010   Qualifier: Diagnosis of  By: [name] Ambien 10 mg daily does not keep asleep AdvilPM is over sedating and has trouble waking up   . Iron deficiency anemia 2011  . Low testosterone 06/22/2016  . Malignant neoplasm of colon ([place]) 01/02/2010   Qualifier: Diagnosis of  By: [name] Dr [name]   . Muscle spasm of back 06/05/2014  . Nipple pain 09/30/2016  . Obesity   . Palpitations 06/05/2014  . Preventative health care 10/08/2012  . Psoriasis   . Psoriatic arthritis ([place]) 08/25/2017  . Right knee pain 11/17/2011   Past Surgical History:  Procedure Laterality Date  . CARPAL TUNNEL RELEASE     RIGHT  . CHOLECYSTECTOMY  1994  . COLONOSCOPY    . CORONARY/GRAFT ACUTE MI REVASCULARIZATION N/A 10/27/2019   Procedure: Coronary/Graft Acute MI Revascularization;  Surgeon: [name], MD;  Location: [place] [facility];  Service: Cardiovascular;  Laterality: N/A;  . HEMICOLECTOMY     12/17/2009 right  . LEFT HEART CATH AND CORONARY ANGIOGRAPHY N/A 10/27/2019   Procedure: LEFT HEART CATH AND CORONARY ANGIOGRAPHY;  Surgeon: [name], MD;  Location: [place] [facility];  Service: Cardiovascular;  Laterality: N/A;  . POLYPECTOMY      Allergies  No Known Allergies  History of Present Illness    [name] is a 59 y.o. male with PMH as above.  Prior to his most  recent hospitalization, he had no prior cardiac history.  He had record of extensive medical problems, including prolonged history of diabetes mellitus requiring insulin, tobacco use, hyperlipidemia, essential hypertension, and colon cancer s/p resection.  He also has known history of anxiety and depression.  Before his most recent 10/27/2019 admission, he was working in the yard and trying to move a lawnmower when he started to have severe substernal chest pain and tightness with associated shortness of breath, nausea, and emesis.  He also experienced significant diaphoresis.  He called EMS.  EKG showed anterior ST elevation and code STEMI activated.  The  patient was then seen by [facility] cardiology in the ED, and he reported still having 9/10 chest pain with shortness of breath and intermittent coughing.  He also appeared very uncomfortable.  Repeat EKG confirmed previous findings.  He was started on heparin and aspirin.  He then proceeded with emergent cardiac catheterization as below.  Catheter showed occluded proximal LAD, successfully treated with PCI and drug-eluting stent placement.  It was noted that there was residual nonocclusive thrombus to the first diagonal branch, and he was started on Aggrastat infusion for the subsequent 18 hours.  Recommendation was for DAPT for at least 1 year.  It was also recommended that he undergo aggressive treatment of risk factors.  Left ventricular angiography was not performed due to significantly elevated LVEDP.  Echo was ordered with significantly reduced EF as below and 35 to 40%.  He was started on losartan and carvedilol, as well as given IV Lasix before leaving the Cath Lab.  Recommendation was for eplerenone /spironolactone before hospital discharge if possible.  Smoking cessation was advised.  Atorvastatin was increased to 80 mg daily.  Since that time, the patient reports that he has not been feeling well.  He reports that his main concern is regarding his energy level and lack of motivation.  He has been relatively sedentary, as he has no motivation.  He does not have a regular exercise program and has not been able to enroll in cardiac rehab 2/2 his current finances.  He reports a significant amount of stress surrounding the fact that he currently does not have a job.  He expresses concern regarding his current cardiac condition and that this was not discovered for many years, despite him reporting the symptoms of pounding heart rate for many years leading up to this.  Since revascularization during his most recent admission, reports that he has felt alleviation of his previous pounding of his heart and his chest,  though it still does occur at times. He also notes he occasionally feels short of breath at rest, which is a new finding for him.  He reports that he has no motivation and is fatigued, stating that he was not even able to clean up his cat's emesis this morning.  He reports positional chest pain, stating he has sharp chest pain lasting only seconds and if he turns in a certain way but the position that triggers this is unclear.  No CP with leaning back or forward. On exam, no CP with deeper breathing. He states he has not taken his medication as of last night, as he believes that he may be feeling poorly 2/2 his current medications. The importance of DAPT compliance was stressed with patient indicating that he would rather stop all of his medications despite the cardiac consequences than spend money to feel like he currently feels.  He is unable to identify the specific medication that makes  him feel poorly. He also reports that he does not wish to be on so many medications with the indication for each medication and the importance of GDMT emphasized. He denies any recent palpitations, only noting the pounding of his heart on occasion when lying in bed.  He denies any orthopnea or early satiety.  No PND, abdominal distention, or feeling of volume overload/weight gain.  He denies any signs or symptoms consistent with bleeding.  He has not yet enrolled in cardiac rehab, as he is unable to afford this with his current insurance.  He does report that he has quit smoking.  He denies any signs or symptoms consistent with sleep apnea.  Current treatment plan and recommendations for labs declined today as outlined below. Primary cardiologist messaged today regarding his Effient, as the patient wishes to be on a different medication (see phone note after this visit).  Home Medications    Prior to Admission medications   Medication Sig Start Date End Date Taking? Authorizing Provider  aspirin 81 MG chewable tablet Chew  1 tablet (81 mg total) by mouth daily. 10/30/19   Ezekiel Slocumb, DO  atorvastatin (LIPITOR) 80 MG tablet Take 1 tablet (80 mg total) by mouth daily at 6 PM. 10/29/19   Ezekiel Slocumb, DO  Blood Glucose Monitoring Suppl (ONETOUCH VERIO) W/DEVICE KIT  10/30/14   [provider]  carvedilol (COREG) 6.25 MG tablet Take 1 tablet (6.25 mg total) by mouth 2 (two) times daily with a meal. 10/29/19   Nicole Kindred A, DO  clobetasol cream (TEMOVATE) 0.05 %  07/28/17   [provider]  diazepam (VALIUM) 5 MG tablet TAKE 1 TABLET(5 MG) BY MOUTH AT BEDTIME AS NEEDED FOR ANXIETY 06/28/19   Mosie Lukes, MD  escitalopram (LEXAPRO) 10 MG tablet Take 1 tablet (10 mg total) by mouth daily. Patient not taking: Reported on 01/01/2019 11/14/18   Mosie Lukes, MD  fenofibrate 160 MG tablet Take 1 tablet (160 mg total) by mouth daily. Patient not taking: Reported on 01/01/2019 11/14/18   Mosie Lukes, MD  folic acid (FOLVITE) 1 MG tablet Take 1 tablet (1 mg total) by mouth daily. 09/20/19   Mosie Lukes, MD  glucose blood [facility] VERIO) test strip Test three times daily, DX E11.9 12/05/14   Mosie Lukes, MD  HYDROcodone-acetaminophen (NORCO) 5-325 MG tablet Take 1 tablet by mouth every 4 (four) hours as needed for moderate pain or severe pain. Patient not taking: Reported on 01/01/2019 05/04/18   Mosie Lukes, MD  Insulin Glargine [facility]) 100 UNIT/ML Inject 0.6 mLs (60 Units total) into the skin at bedtime. 10/29/19   Ezekiel Slocumb, DO  insulin lispro (HUMALOG KWIKPEN) 100 UNIT/ML KwikPen Inject 0.15 mLs (15 Units total) into the skin 3 (three) times daily with meals. 10/29/19   Nicole Kindred A, DO  Insulin Pen Needle (B-D ULTRAFINE III SHORT PEN) 31G X 8 MM MISC Test as directed three times daily.  DX E11.9 11/14/18   Mosie Lukes, MD  losartan (COZAAR) 25 MG tablet Take 0.5 tablets (12.5 mg total) by mouth daily. 10/30/19   Nicole Kindred A, DO  metFORMIN (GLUCOPHAGE)  1000 MG tablet TAKE 1 TABLET (1,000 MG TOTAL) BY MOUTH 2 (TWO) TIMES DAILY WITH A MEAL. 11/14/18   Mosie Lukes, MD  omeprazole (PRILOSEC) 40 MG capsule Take 1 capsule (40 mg total) by mouth 2 (two) times daily as needed. Patient not taking: Reported on 01/01/2019 11/14/18  Mosie Lukes, MD  [place] LANCETS [place]  0/35/46   [provider]  prasugrel (EFFIENT) 10 MG TABS tablet Take 1 tablet (10 mg total) by mouth daily. 10/30/19   Ezekiel Slocumb, DO  Secukinumab (COSENTYX 300 DOSE) 150 MG/ML SOSY Inject 300 mg into the skin once monthly. 11/23/17   [provider]  spironolactone (ALDACTONE) 25 MG tablet Take 0.5 tablets (12.5 mg total) by mouth daily. 10/30/19   Ezekiel Slocumb, DO  zolpidem (AMBIEN) 10 MG tablet TAKE 1 TABLET(10 MG) BY MOUTH AT BEDTIME AS NEEDED 06/11/19   Mosie Lukes, MD    Review of Systems    He denies palpitations, pnd, orthopnea, n, v, dizziness, syncope, edema, weight gain, or early satiety.  He reports chest pain if moving in a certain way (positional).  He feels intermittent shortness of breath at rest.  He reports fatigue and lack of motivation.  He reports improved feeling of pounding heart rate (but not palpitations), especially when laying down in bed..   All other systems reviewed and are otherwise negative except as noted above.  Physical Exam    VS:  BP 130/64 (BP Location: Right Arm, Patient Position: Sitting, Cuff Size: Normal)   Pulse (!) 102   Ht '5\' 6"'  (1.676 m)   Wt 222 lb (100.7 kg)   SpO2 96%   BMI 35.83 kg/m  , BMI Body mass index is 35.83 kg/m. GEN: Well nourished, well developed, in no acute distress. HEENT: normal. Neck: Supple, JVD difficult to assess 2/2 body habitus, carotid bruits, or masses. Cardiac: Tachycardic but regular, when leaning forward, slight pericardial friction rub appreciated but otherwise no murmurs, rubs, or gallops. No clubbing, cyanosis. Moderate bilateral edema. Radials/DP/PT 2+ and  equal bilaterally.  Respiratory:  Respirations regular and unlabored, clear to auscultation bilaterally. GI: Soft, nontender, nondistended, BS + x 4. MS: no deformity or atrophy. Skin: warm and dry, no rash. Neuro:  Strength and sensation are intact. Psych: Normal affect.  Accessory Clinical Findings    ECG personally reviewed by me today -sinus tachycardia, 102 bpm, previous inferior changes noted in 2, 3, aVF.  Previous anteroseptal changes noted and improved from previous EKG and s/p revascularization - no acute changes.  VITALS Reviewed today   Temp Readings from Last 3 Encounters:  11/05/19 98.6 F (37 C)  10/29/19 98.7 F (37.1 C) (Oral)  06/28/19 98.6 F (37 C) (Temporal)   BP Readings from Last 3 Encounters:  11/05/19 137/85  11/05/19 130/64  10/29/19 125/89   Pulse Readings from Last 3 Encounters:  11/05/19 (!) 105  11/05/19 (!) 102  10/29/19 (!) 105    Wt Readings from Last 3 Encounters:  11/05/19 222 lb (100.7 kg)  10/29/19 219 lb 3.2 oz (99.4 kg)  06/28/19 220 lb 6.4 oz (100 kg)     LABS  reviewed today     Lab Results  Component Value Date   WBC 13.2 (H) 10/29/2019   HGB 12.0 (L) 10/29/2019   HCT 35.7 (L) 10/29/2019   MCV 96.5 10/29/2019   PLT 315 10/29/2019   Lab Results  Component Value Date   CREATININE 0.78 10/29/2019   BUN 15 10/29/2019   NA 135 10/29/2019   K 3.7 10/29/2019   CL 105 10/29/2019   CO2 22 10/29/2019   Lab Results  Component Value Date   ALT 65 (H) 10/29/2019   AST 87 (H) 10/29/2019   ALKPHOS 61 10/29/2019   BILITOT 1.2 10/29/2019  Lab Results  Component Value Date   CHOL 221 (H) 10/27/2019   HDL 31 (L) 10/27/2019   LDLCALC UNABLE TO CALCULATE IF TRIGLYCERIDE OVER 400 mg/dL 10/27/2019   LDLDIRECT 112.7 (H) 10/27/2019   TRIG 453 (H) 10/27/2019   CHOLHDL 7.1 10/27/2019    Lab Results  Component Value Date   HGBA1C 8.8 (H) 10/27/2019   Lab Results  Component Value Date   TSH 2.77 08/25/2018      STUDIES/PROCEDURES reviewed today   LHC 10/27/2019:  A drug-eluting stent was successfully placed using a STENT RESOLUTE ONYX 2.75X30.  Prox LAD to Mid LAD lesion is 100% stenosed.  Post intervention, there is a 0% residual stenosis.  1st Diag-1 lesion is 90% stenosed.  1st Diag-2 lesion is 80% stenosed. 1. Codominant coronary arteries with thrombotic occlusion of the proximal LAD which is the culprit for anterior ST elevation myocardial infarction. No other obstructive disease. 2. Left ventricular angiography was not performed due to severely elevated left ventricular end-diastolic pressure at 34 mmHg 3. Successful angioplasty and drug-eluting stent placement to the proximal LAD. Recommendations: We will use Aggrastat infusion for 18 hours due to residual thrombus in first diagonal. Dual antiplatelet therapy for at least 1 year. Aggressive treatment of risk factors. The patient was given 1 dose of IV furosemide 40 mg once daily before he left the Cath Lab. I do not expect him to require more diuresis but can be followed clinically. I ordered an echocardiogram. __________  2D echo 10/28/2019: 1. Left ventricular ejection fraction, by estimation, is 35 to 40%. The  left ventricle has moderately decreased function. The left ventricle  demonstrates regional wall motion abnormalities (see scoring  diagram/findings for description). Left ventricular  diastolic parameters are consistent with Grade I diastolic dysfunction  (impaired relaxation). There is severe hypokinesis of the left  ventricular, entire anteroseptal wall and anterior wall.  2. Right ventricular systolic function is normal. The right ventricular  size is normal.  3. The mitral valve is normal in structure. No evidence of mitral valve  regurgitation.  4. The aortic valve is normal in structure. Aortic valve regurgitation is  not visualized.  5. The inferior vena cava is dilated in size with >50%  respiratory   Assessment & Plan    Recent anterior STEMI Coronary artery disease Atypical chest pain --Denies current chest pain.  Does note positional and atypical chest pain at times and described as sharp, brief, and only with certain positions.  Reports significant fatigue and lack of motivation.  Also reports that he cannot get a deep breath at times but not consistently.  He has not taken his medication since last night, as he believed that his fatigue and lack of motivation were 2/2 his current medications.   --Current treatment plan declined. He declines further medical management, including antianginal therapy and escalation of GDMT.  He declines any repeat labs, including BMET and CBC today. He declines cardiac rehab.  --We reviewed the importance of him taking his medications, especially his DAPT with ASA and Effient without interruption and for the next 12 months.  We reviewed the importance of close follow-up and cardiac rehab. We also reviewed the importance of aggressive treatment of risk factors.  Patient declines at this time.   Acute HFrEF secondary to ICM --Echo showed reduced EF 35 to 40%. He does have some moderate LEE and signs of volume overload today, which we discussed could be contributing to his fatigue and lack of motivation, as well as  his inability to take deep breaths.   --Declined recommendation to start Lasix 64m daily with BMET today and in 1 week.  Recommend continue spironolactone, Coreg, and losartan.  [facility], [facility] however, due to his difficulty with insurance coverage, as well as his declining therapy, we may not be able to do this as an outpatient.  Ideally, we would also recheck an echo in approximately 3 months following percutaneous revascularization and optimization of maximally tolerated GDMT.He is uncertain if he wants to continue on his current medications at this time.  HTN --Ideally, we would escalate GDMT  today [facility]  Discussed monitoring blood pressure at home.  Also discussed lifestyle changes and secondary prevention.  Recommend continue current medications.  Patient is uncertain if he wishes to continue on his current medications at this time.  HLD --Declined recheck of lipid and liver function in upcoming weeks, given he was titrated to Lipitor 80 mg during his admission.  He is uncertain if he wants to continue his atorvastatin at this time.  Tobacco use --Reports he quit smoking.  Medication compliance --Current treatment plan declined. Compliance stressed today. Long discussion regarding compliance. He declines any further clinic follow-up at this time. Will reach out to his primary cardiologist.    Medication changes: Declined addition of antianginal or escalation of GDMT. Labs ordered: Declined follow-up BMET/CBC. Studies / Imaging ordered: Declined follow-up echo in 3 months Future considerations: Declined follow-up echo in 3 months.  Declined cardiac rehab. Disposition: Declined RTC.  Total time spent with patient today 65 minutes. This includes reviewing records, evaluating the patient, and coordinating care. Face-to-face time >50%.    JArvil Chaco PA-C 11/05/2019

## 2019-11-05 NOTE — Telephone Encounter (Signed)
Patient fiance states pt is not feeling well. States pt feels disoriented, uncomfortable and he cant wake up. Please advise.

## 2019-11-05 NOTE — ED Triage Notes (Signed)
Pt comes via POV from home with c/o chest pain that started earlier today. Pt states he recently jsut had a widow maker heart attack over this weekend and was admitted. Pt states he was prescribed Prasugrel and has been taking like prescribed.    Pt states he saw PCP today and was told to take this medication along with an aspirin and it should help the pain. Pt states it didn't help. Pt thinks he may be being poisoned.

## 2019-11-05 NOTE — ED Notes (Signed)
Pt again to STAT desk stating he is now leaving; again explained to pt that he does need to stay and be evaluated; explained to pt that he need admission to hospital; pt st "well the ER doctor is not going to override my doctor, I'm not here for that it's only for a prescription; I seen them today already and I will call them tomorrow"; again explained to pt importance of staying and being seen by the provider; pt reports he is leaving

## 2019-11-05 NOTE — Telephone Encounter (Signed)
Call transferred directly to triage from the patient's fiance.  Per his fiance, she was in clinic with him this morning when he saw Marrianne Mood, [place].  The patient had not taken any medications yesterday or this morning as he felt like they were making him feel bad.  The patient refused to resume any of his medications but was instructed by Jacquelyn to at least take his ASA and effient when he got home today. The patient's fiance states she is now at work, but the patient has texted her stating he took the effient and ASA when he got home (~ 3 hours ago) and now his is feeling weak, like he can't hardly get up and somewhat disoriented.   I inquired if this was how he felt prior to his heart attack. Per the patient's fiance, some symptoms are similar but no chest pain now.   I have advised her I will need to review with Marrianne Mood, PA and we will call the patient back directly.  The patient's fiance voices understanding.

## 2019-11-06 ENCOUNTER — Encounter: Payer: Self-pay | Admitting: Cardiovascular Disease

## 2019-11-06 ENCOUNTER — Ambulatory Visit (INDEPENDENT_AMBULATORY_CARE_PROVIDER_SITE_OTHER): Payer: Commercial Managed Care - PPO | Admitting: Cardiovascular Disease

## 2019-11-06 ENCOUNTER — Telehealth: Payer: Self-pay | Admitting: Emergency Medicine

## 2019-11-06 VITALS — BP 134/72 | HR 102 | Ht 67.0 in | Wt 221.8 lb

## 2019-11-06 DIAGNOSIS — I252 Old myocardial infarction: Secondary | ICD-10-CM | POA: Diagnosis not present

## 2019-11-06 DIAGNOSIS — I10 Essential (primary) hypertension: Secondary | ICD-10-CM | POA: Diagnosis not present

## 2019-11-06 DIAGNOSIS — E782 Mixed hyperlipidemia: Secondary | ICD-10-CM

## 2019-11-06 DIAGNOSIS — I5022 Chronic systolic (congestive) heart failure: Secondary | ICD-10-CM

## 2019-11-06 MED ORDER — CLOPIDOGREL BISULFATE 75 MG PO TABS: ORAL_TABLET | ORAL | 5 refills | Status: DC

## 2019-11-06 MED ORDER — METOPROLOL SUCCINATE ER 25 MG PO TB24: 25.0000 mg | ORAL_TABLET | Freq: Every day | ORAL | 5 refills | Status: DC

## 2019-11-06 MED ORDER — ATORVASTATIN CALCIUM 40 MG PO TABS: 80.0000 mg | ORAL_TABLET | Freq: Every day | ORAL | 5 refills | Status: DC

## 2019-11-06 MED ORDER — PANTOPRAZOLE SODIUM 40 MG PO TBEC: 40.0000 mg | DELAYED_RELEASE_TABLET | Freq: Every day | ORAL | 5 refills | Status: DC

## 2019-11-06 NOTE — Telephone Encounter (Signed)
Pt c/o medication issue:  1. Name of Medication: Effient   2. How are you currently taking this medication (dosage and times per day)? Not taking since yesterday   3. Are you having a reaction (difficulty breathing--STAT)? Went to ed for negative reaction but was not seen as he left after waiting for hours and being "left for dead "   4. What is your medication issue? As promised patient took medication yesterday and had a reaction  Patient would like an urgent call back from jacqueline to discuss  Patient also insists he is a patient of Dr. Laurann Montana.  Also not to discuss or involve dr. Rockey Situ as he has no idea who that is.  Patient argumentative during call and insists on an urgent response.

## 2019-11-06 NOTE — Telephone Encounter (Signed)
Called patient due to lwot to inquire about condition and follow up plans.  He says he has an appointment to see dr Fletcher Anon this afternoon.

## 2019-11-06 NOTE — Patient Instructions (Addendum)
Medication Instructions:  Your physician has recommended you make the following change in your medication:   1) STOP Effient 2) STOP Carvedilol 3) STOP Prilosec 4) DECREASE Atorvastatin to 40 mg daily. An Rx has been sent to your pharmacy.  1) START Plavix. First day take 8 tablets (600mg ), After take 75 mg daily. An Rx has bee sen to your pharmacy. 2) START Metoprolol Succinate 25 mg daily. An Rx has been sent to your pharmacy 3) START Protonix 40 mg daily. An Rx has been sent to your pharmacy.  *If you need a refill on your cardiac medications before your next appointment, please call your pharmacy*   Lab Work: None ordered If you have labs (blood work) drawn today and your tests are completely normal, you will receive your results only by: Marland Kitchen MyChart Message (if you have MyChart) OR . A paper copy in the mail If you have any lab test that is abnormal or we need to change your treatment, we will call you to review the results.   Testing/Procedures: None ordered   Follow-Up: At [facility], you and your health needs are our priority.  As part of our continuing mission to provide you with exceptional heart care, we have created designated Provider Care Teams.  These Care Teams include your primary Cardiologist (physician) and Advanced Practice Providers (APPs -  Physician Assistants and Nurse Practitioners) who all work together to provide you with the care you need, when you need it.  We recommend signing up for the patient portal called "MyChart".  Sign up information is provided on this After Visit Summary.  MyChart is used to connect with patients for Virtual Visits (Telemedicine).  Patients are able to view lab/test results, encounter notes, upcoming appointments, etc.  Non-urgent messages can be sent to your provider as well.   To learn more about what you can do with MyChart, go to [email or web address].    Your next appointment:   2 week(s)  The format for your next  appointment:   In Person  Provider:   Kathlyn [place], MD   Other Instructions N/A

## 2019-11-06 NOTE — Progress Notes (Signed)
Cardiology Office Note   Date:  11/06/2019   ID:  [name], DOB 03/08/1961, MRN [id number]  PCP:  Frank Lukes, MD  Cardiologist:   Frank Sacramento, MD   Chief Complaint  Patient presents with  . other    Pt. c/o rapid heart beats, chest pain when taking a deep breath. Meds reviewed by the pt. verbally.       History of Present Illness: [name] is a 59 y.o. male who presents for for follow-up visit regarding coronary artery disease and chronic systolic heart failure due to ischemic cardiomyopathy.  He has known history of diabetes mellitus, essential hypertension, hyperlipidemia and tobacco use. He presented recently with anterior ST elevation myocardial infarction.  I proceeded with emergent cardiac catheterization which showed an occluded proximal LAD.  This was treated successfully with PCI and drug-eluting stent placement.  There was residual nonocclusive thrombus in the first diagonal which was treated with Aggrastat infusion for 18 hours.  And his LVEDP was 35 and he was given 1 dose of IV furosemide.  Echocardiogram showed an EF of 35 to 40%.  He was discharged home on dual antiplatelet therapy with aspirin and prasugrel, Toprol was switched to carvedilol and small dose of Aldactone was added.  He was already on losartan.  He was seen yesterday by [name] and reported feeling tired with shortness of breath.  He felt that he was getting sick every time he took prasugrel.  The importance of dual antiplatelet therapy was stressed to him.  He went home and took a dose and then felt extremely bad to the point where he went to the emergency room.  He had labs done there which showed normal basic metabolic profile.  CBC showed improving hemoglobin to 12.7.  High-sensitivity troponin was 1400 but that was obviously trending down from greater than 27,000 from his recent myocardial infarction. The patient reports palpitations.  He feels that he was doing better on Toprol before  it was switched to carvedilol.  In addition, he cannot take Effient as it makes him feel very bad.  He has some shortness of breath and occasional episodes of chest pain.  He did not take Effient today.  He seems to be very anxious.  He quit smoking since his cardiac event.   Past Medical History:  Diagnosis Date  . Anemia 10/03/2009   Qualifier: Diagnosis of  By: [name]   . Anxiety associated with depression 10/11/2013  . Colon cancer ([place])    right colon cancer- adenocarcinoma CEA level isnrmal at 1.8  . Diabetes mellitus   . Diabetes mellitus type 2 in obese [facility]) 03/20/2010   Qualifier: Diagnosis of  By: [name]    . Erectile dysfunction 06/22/2016  . Essential hypertension 06/05/2014  . FATTY LIVER DISEASE 11/04/2009   Qualifier: Diagnosis of  By: Frank Pastor MD, [name]   Qualifier: Diagnosis of  By: Frank Pastor MD, [name]  Last Assessment & Plan:  conirmed by CT scan of abdomen in April 2016 encouraged to minimize simple carbs and fatty foods.  Marland Kitchen GERD 11/04/2009   Qualifier: Diagnosis of  By: Frank Pastor MD, [name]   . Great toe pain, right 04/18/2017  . Hepatic artery stenosis ([name])   . History of colon cancer 01/02/2010   Qualifier: Diagnosis of  By: [name] Dr [name]  Last Assessment & Plan:  Follows closely with gastroenterology, Dr [name]  . Hyperlipidemia   . Hyperlipidemia,  mixed 09/07/2010   Qualifier: Diagnosis of  By: [name]   . Hypertension   . Hypertriglyceridemia 12/14/2010  . Hypotestosteronism 06/22/2016  . INSOMNIA, CHRONIC 03/20/2010   Qualifier: Diagnosis of  By: [name] Ambien 10 mg daily does not keep asleep AdvilPM is over sedating and has trouble waking up   . Iron deficiency anemia 2011  . Low testosterone 06/22/2016  . Malignant neoplasm of colon ([place]) 01/02/2010   Qualifier: Diagnosis of  By: [name] Dr [name]   . Muscle spasm of back 06/05/2014  . Nipple pain 09/30/2016  . Obesity   . Palpitations 06/05/2014  .  Preventative health care 10/08/2012  . Psoriasis   . Psoriatic arthritis ([name]) 08/25/2017  . Right knee pain 11/17/2011    Past Surgical History:  Procedure Laterality Date  . CARPAL TUNNEL RELEASE     RIGHT  . CHOLECYSTECTOMY  1994  . COLONOSCOPY    . CORONARY/GRAFT ACUTE MI REVASCULARIZATION N/A 10/27/2019   Procedure: Coronary/Graft Acute MI Revascularization;  Surgeon: Frank Hampshire, MD;  Location: [place] [facility];  Service: Cardiovascular;  Laterality: N/A;  . HEMICOLECTOMY     12/17/2009 right  . LEFT HEART CATH AND CORONARY ANGIOGRAPHY N/A 10/27/2019   Procedure: LEFT HEART CATH AND CORONARY ANGIOGRAPHY;  Surgeon: Frank Hampshire, MD;  Location: [place] [facility];  Service: Cardiovascular;  Laterality: N/A;  . POLYPECTOMY       Current Outpatient Medications  Medication Sig Dispense Refill  . aspirin 81 MG chewable tablet Chew 1 tablet (81 mg total) by mouth daily. 30 tablet 1  . atorvastatin (LIPITOR) 80 MG tablet Take 1 tablet (80 mg total) by mouth daily at 6 PM. 30 tablet 1  . Blood Glucose Monitoring Suppl (ONETOUCH VERIO) W/DEVICE KIT   0  . clobetasol cream (TEMOVATE) 0.05 %     . escitalopram (LEXAPRO) 10 MG tablet Take 1 tablet (10 mg total) by mouth daily. 90 tablet 1  . fenofibrate 160 MG tablet Take 1 tablet (160 mg total) by mouth daily. 90 tablet 3  . glucose blood (ONETOUCH VERIO) test strip Test three times daily, DX E11.9 300 each 6  . HYDROcodone-acetaminophen (NORCO) 5-325 MG tablet Take 1 tablet by mouth every 4 (four) hours as needed for moderate pain or severe pain. 30 tablet 0  . Insulin Glargine (BASAGLAR KWIKPEN) 100 UNIT/ML Inject 0.6 mLs (60 Units total) into the skin at bedtime. 15 pen 3  . insulin lispro (HUMALOG KWIKPEN) 100 UNIT/ML KwikPen Inject 0.15 mLs (15 Units total) into the skin 3 (three) times daily with meals. 15 mL 11  . Insulin Pen Needle (B-D ULTRAFINE III SHORT PEN) 31G X 8 MM MISC Test as directed three times daily.  DX E11.9  300 each 3  . losartan (COZAAR) 25 MG tablet Take 0.5 tablets (12.5 mg total) by mouth daily. 30 tablet 1  . metFORMIN (GLUCOPHAGE) 1000 MG tablet TAKE 1 TABLET (1,000 MG TOTAL) BY MOUTH 2 (TWO) TIMES DAILY WITH A MEAL. 180 tablet 2  . omeprazole (PRILOSEC) 40 MG capsule Take 1 capsule (40 mg total) by mouth 2 (two) times daily as needed. 180 capsule 1  . [place] LANCETS MISC     . Secukinumab (COSENTYX 300 DOSE) 150 MG/ML SOSY Inject 300 mg into the skin once monthly.    Marland Kitchen spironolactone (ALDACTONE) 25 MG tablet Take 0.5 tablets (12.5 mg total) by mouth daily. 30 tablet 1  .  zolpidem (AMBIEN) 10 MG tablet TAKE 1 TABLET(10 MG) BY MOUTH AT BEDTIME AS NEEDED 15 tablet 0   No current facility-administered medications for this visit.    Allergies:   Patient has no known allergies.    Social History:  The patient  reports that he has been smoking cigarettes. He has never used smokeless tobacco. He reports that he does not drink alcohol or use drugs.   Family History:  The patient's family history includes Alcohol abuse in his brother; Diabetes in his brother; Diabetes type II in his brother; Stomach cancer in his mother.    ROS:  Please see the history of present illness.   Otherwise, review of systems are positive for none.   All other systems are reviewed and negative.    PHYSICAL EXAM: VS:  BP 134/72 (BP Location: Left Arm, Patient Position: Sitting, Cuff Size: Normal)   Pulse (!) 102   Ht '5\' 7"'  (1.702 m)   Wt 221 lb 12 oz (100.6 kg)   SpO2 98%   BMI 34.73 kg/m  , BMI Body mass index is 34.73 kg/m. GEN: Well nourished, well developed, in no acute distress  HEENT: normal  Neck: no JVD, carotid bruits, or masses Cardiac: RRR; no murmurs, rubs, or gallops,no edema  Respiratory:  clear to auscultation bilaterally, normal work of breathing GI: soft, nontender, nondistended, + BS MS: no deformity or atrophy  Skin: warm and dry, no rash Neuro:  Strength and sensation are  intact Psych: euthymic mood, full affect Right radial pulses normal with no hematoma   EKG:  EKG is ordered today. The ekg ordered today demonstrates sinus tachycardia with evolving anterior MI.   Recent Labs: 10/29/2019: ALT 65; Magnesium 2.0 11/05/2019: BUN 15; Creatinine, Ser 0.75; Hemoglobin 12.7; Platelets 321; Potassium 3.9; Sodium 138    Lipid Panel    Component Value Date/Time   CHOL 221 (H) 10/27/2019 1406   CHOL 949 (H) 10/27/2014 1322   TRIG 453 (H) 10/27/2019 1406   TRIG 1,098 (H) 10/30/2014 0605   HDL 31 (L) 10/27/2019 1406   HDL 20 (L) 10/27/2014 1322   CHOLHDL 7.1 10/27/2019 1406   VLDL UNABLE TO CALCULATE IF TRIGLYCERIDE OVER 400 mg/dL 10/27/2019 1406   VLDL SEE COMMENT 10/27/2014 1322   LDLCALC UNABLE TO CALCULATE IF TRIGLYCERIDE OVER 400 mg/dL 10/27/2019 1406   LDLCALC SEE COMMENT 10/27/2014 1322   LDLDIRECT 112.7 (H) 10/27/2019 1406      Wt Readings from Last 3 Encounters:  11/06/19 221 lb 12 oz (100.6 kg)  11/05/19 222 lb (100.7 kg)  11/05/19 222 lb (100.7 kg)        PAD Screen 12/07/2017  Previous PAD dx? No  Previous surgical procedure? No  Pain with walking? No  Feet/toe relief with dangling? Yes  Painful, non-healing ulcers? No  Extremities discolored? No      ASSESSMENT AND PLAN:  1.  Recent anterior ST elevation myocardial infarction: The patient reports inability to tolerate prasugrel.  I discussed the importance of dual antiplatelet therapy.  I elected to switch him to clopidogrel 75 mg once daily with a 600 mg loading dose on the first day.  Continue aspirin indefinitely.  Continue aggressive treatment of risk factors.  2.  Chronic systolic heart failure due to ischemic cardiomyopathy: No evidence of significant volume overload.  Ejection fraction was 35 to 40%.  He reports feeling better with Toprol than carvedilol.  Thus, I elected to switch him from carvedilol to Toprol 25 mg once daily.  Continue small dose losartan and  spironolactone.  Small dose digoxin can be considered if not able to uptitrate Toprol due to low blood pressure.  3.  Essential hypertension: Blood pressure is controlled.  4.  Hyperlipidemia: He reports feeling bad with multiple medications.  I elected to decrease atorvastatin to 40 mg daily especially that he is on fenofibrate.  He does have mixed hyperlipidemia.    Disposition:   FU with me in 2 weeks  Signed,  Frank Sacramento, MD  11/06/2019 5:10 PM    [place]

## 2019-11-06 NOTE — Telephone Encounter (Signed)
Patient calling to check status of request  Patient states provider discussed urgency of this matter. He was told this was vital due to needing this med to keep stent open.  Patient states he feels like he was hit by a freight train .   Patient denies sob chest pain .

## 2019-11-06 NOTE — Telephone Encounter (Addendum)
Per Dr. Fletcher Anon. The patient needs to be seen today to discuss medication management.  Spoke with the patient. Adv him that Dr. Fletcher Anon would like to see him today to discuss medication management. Patient is agreeable.  Appt scheduled for 11/06/19 @ 4:40pm with Dr. Fletcher Anon. 1 visitor allowed. Patient is aware of the appt date, time, and location. Patient voiced appreciation for the call and the assistance given.

## 2019-11-07 ENCOUNTER — Telehealth: Payer: Self-pay

## 2019-11-07 MED ORDER — PANTOPRAZOLE SODIUM 40 MG PO TBEC: 40.0000 mg | DELAYED_RELEASE_TABLET | Freq: Every day | ORAL | 2 refills | Status: DC

## 2019-11-07 MED ORDER — METOPROLOL SUCCINATE ER 25 MG PO TB24: 25.0000 mg | ORAL_TABLET | Freq: Every day | ORAL | 3 refills | Status: DC

## 2019-11-07 MED ORDER — ATORVASTATIN CALCIUM 40 MG PO TABS: 40.0000 mg | ORAL_TABLET | Freq: Every day | ORAL | 3 refills | Status: DC

## 2019-11-07 MED ORDER — CLOPIDOGREL BISULFATE 75 MG PO TABS: 75.0000 mg | ORAL_TABLET | Freq: Every day | ORAL | 3 refills | Status: DC

## 2019-11-07 NOTE — Telephone Encounter (Signed)
Patients Atorvastatin was reduced to 40 mg daily yesterday at his o/v with Dr. Fletcher Anon. The Rx was sent in with his other medications. I forgot to mention it when discharging the patient. Wanted to make sure they are aware.  lmtcb.

## 2019-11-07 NOTE — Addendum Note (Signed)
Addended by: Lamar Laundry on: 11/07/2019 11:12 AM   Modules accepted: Orders

## 2019-11-07 NOTE — Telephone Encounter (Signed)
Spoke with the patient. Patient sts that when he went to pick up his medications yesterday evening at [facility] his out of pocket out would have been $300+ even with Good Rx. He was not able to afford the medications. He did purchase #9 tablets of Plavix 75mg  for $22 so that he could start his Plavix loading yesterday of 8 tablets (600 mg) and have 1 tablet 75 mg to take today. Patient rqst that the Rx for his cardiac medications be sent to medication management clinic. Advised the patient that his Atorvastatin has been reduced to 40mg  daily and that I will send the Rx for the Atorvastatin, Plavix, and Metoprolol to medication management as requested.  Patient rqst that something be prescribed for his anxiety. Adv the patient that we only write prescriptions for his cardiac medications. Encouraged him to contact his pcp to discuss his anxiety and possible need for medication.  Patient verbalized understanding and voiced appreciation for the assistance.

## 2019-11-07 NOTE — Telephone Encounter (Signed)
Patient is calling regarding his heartburn medication. Please call to advise.

## 2019-11-07 NOTE — Telephone Encounter (Signed)
Spoke with the patient. Advised him that I have just sent the Rx for Protonix 40 mg qd to medication mgt. Apologized for any inconvenience. Patient verbalized understanding and voiced appreciation for the assistance.

## 2019-11-12 ENCOUNTER — Other Ambulatory Visit: Payer: Self-pay

## 2019-11-12 ENCOUNTER — Telehealth: Payer: Self-pay | Admitting: Family Medicine

## 2019-11-12 ENCOUNTER — Ambulatory Visit: Payer: Self-pay | Admitting: Pharmacy Technician

## 2019-11-12 DIAGNOSIS — Z79899 Other long term (current) drug therapy: Secondary | ICD-10-CM

## 2019-11-12 NOTE — Progress Notes (Signed)
Met with patient completed financial assistance application for [facility].  Patient provided financial information.  Forwarding to Patient Financial Services for review for charity care.  Provided patient with phone number to contact if no response from Patient Financial services after 2 weeks.    Completed [facility] application and contract.  Patient agreed to all terms of the [facility] contract.    Patient approved to receive medication assistance at [facility], until time for re-certification in 3968, and as long as eligibility criteria continues to be met.    Provided patient with Civil engineer, contracting based on his particular needs.    Contacted Dr. Gwendalyn Ege to schedule an appointment.  Patient requesting an in-person visit due to limited cell phone service and no computer.  First available in-person appointment with Dr. Charlett Blake not until 01/31/20.  Scheduler to send a message to provider requesting an appointment that is sooner.    Patient indicated that Dr. Fletcher Anon wants patient to see Dr. Charlett Blake right away.  Suggested that patient contact Dr. Fletcher Anon and see if he can assist with obtaining an appointment with Dr. Charlett Blake earlier than January 31, 2020.  Discussed patient completing MTM with pharmacist at [facility].  Patient requested in-person appointment.  Pharmacist is to look at schedule and provide receptionist with some days when patient could come into our clinic to complete MTM.  Receptionist will contact patient and schedule the MTM.   [place]

## 2019-11-12 NOTE — Telephone Encounter (Signed)
Caller: Makani Marchesani  Call Back # [phone]  Subject: Hospital Follow Up/Heart Attack  Per patient he has been admitted to hospital with a widow maker heart attack, patient was told to follow up with PCP after hospitalization, patient did not do so b/c of lack of insurance. Patient recommended by cardiologist to follow up with PCP. Patient is requesting a IN PERSON visit due to poor cell service where he lives.  Please Advise

## 2019-11-13 NOTE — Telephone Encounter (Signed)
Spoke with patient and appointment made for next week.

## 2019-11-19 ENCOUNTER — Other Ambulatory Visit: Payer: Self-pay

## 2019-11-19 ENCOUNTER — Ambulatory Visit (INDEPENDENT_AMBULATORY_CARE_PROVIDER_SITE_OTHER): Payer: Commercial Managed Care - PPO | Admitting: Family Medicine

## 2019-11-19 VITALS — BP 118/60 | HR 98 | Temp 98.8°F | Resp 12 | Ht 68.0 in | Wt 218.2 lb

## 2019-11-19 DIAGNOSIS — M791 Myalgia, unspecified site: Secondary | ICD-10-CM

## 2019-11-19 DIAGNOSIS — E669 Obesity, unspecified: Secondary | ICD-10-CM

## 2019-11-19 DIAGNOSIS — M79601 Pain in right arm: Secondary | ICD-10-CM | POA: Diagnosis not present

## 2019-11-19 DIAGNOSIS — I10 Essential (primary) hypertension: Secondary | ICD-10-CM

## 2019-11-19 DIAGNOSIS — M7989 Other specified soft tissue disorders: Secondary | ICD-10-CM

## 2019-11-19 DIAGNOSIS — E782 Mixed hyperlipidemia: Secondary | ICD-10-CM

## 2019-11-19 DIAGNOSIS — E1169 Type 2 diabetes mellitus with other specified complication: Secondary | ICD-10-CM | POA: Diagnosis not present

## 2019-11-19 MED ORDER — HYDROCODONE-ACETAMINOPHEN 5-325 MG PO TABS: 1.0000 | ORAL_TABLET | ORAL | 0 refills | Status: DC | PRN

## 2019-11-19 MED ORDER — HYDROCODONE-ACETAMINOPHEN 5-325 MG PO TABS: 1.0000 | ORAL_TABLET | Freq: Four times a day (QID) | ORAL | 0 refills | Status: DC | PRN

## 2019-11-19 NOTE — Patient Instructions (Addendum)
Metformin 500 mg daily  Use Basaglar 10 units every morning, increase by 2 units every 3 days if blood sugars remain 150   Then use   The sliding scale Humalog  Check sugars three sugars 3 x a day with meals  humalog 4 units with each meal unless your sugar is below 70  200 take 6 units  250 take 8 units 300 take 10 units 350 take 12 units    Atorvastatin take on ly 1/2 pill tonight and then talk to cardiology tomorrow about switching to rosuvastatin  Start Co Q10 100 mg daily  hydrate

## 2019-11-20 ENCOUNTER — Ambulatory Visit (INDEPENDENT_AMBULATORY_CARE_PROVIDER_SITE_OTHER): Payer: Commercial Managed Care - PPO | Admitting: Cardiovascular Disease

## 2019-11-20 VITALS — BP 104/64 | HR 92 | Ht 66.0 in | Wt 223.1 lb

## 2019-11-20 DIAGNOSIS — I10 Essential (primary) hypertension: Secondary | ICD-10-CM

## 2019-11-20 DIAGNOSIS — E785 Hyperlipidemia, unspecified: Secondary | ICD-10-CM | POA: Diagnosis not present

## 2019-11-20 DIAGNOSIS — I2510 Atherosclerotic heart disease of native coronary artery without angina pectoris: Secondary | ICD-10-CM | POA: Diagnosis not present

## 2019-11-20 DIAGNOSIS — I5022 Chronic systolic (congestive) heart failure: Secondary | ICD-10-CM

## 2019-11-20 LAB — CK: Total CK: 38 U/L (ref 7–232)

## 2019-11-20 MED ORDER — ROSUVASTATIN CALCIUM 10 MG PO TABS: 10.0000 mg | ORAL_TABLET | Freq: Every day | ORAL | 1 refills | Status: DC

## 2019-11-20 NOTE — Progress Notes (Signed)
Cardiology Office Note   Date:  11/20/2019   ID:  [name], DOB 1961-06-15, MRN [id number]  PCP:  Mosie Lukes, MD  Cardiologist:   Kathlyn Sacramento, MD   Chief Complaint  Patient presents with  . other    2 wk f/u c/o leg pain/myalgia/aches. Meds reviewed verbally with pt.      History of Present Illness: [name] is a 59 y.o. male who presents for for follow-up visit regarding coronary artery disease and chronic systolic heart failure due to ischemic cardiomyopathy.  He has known history of diabetes mellitus, essential hypertension, hyperlipidemia and tobacco use. He presented in April  with anterior ST elevation myocardial infarction.  I proceeded with emergent cardiac catheterization which showed an occluded proximal LAD.  This was treated successfully with PCI and drug-eluting stent placement.  There was residual nonocclusive thrombus in the first diagonal which was treated with Aggrastat infusion for 18 hours.  And his LVEDP was 35 and he was given 1 dose of IV furosemide.  Echocardiogram showed an EF of 35 to 40%.  He did not tolerate prasugrel and was switched to clopidogrel with subsequent improvement in symptoms  He quit smoking since his cardiac event.  During last visit he was switched from carvedilol to Toprol due to persistent tachycardia and shortness of breath.  He now reports feeling significantly better.  He had significant myalgia that he felt was due to atorvastatin.   Past Medical History:  Diagnosis Date  . Anemia 10/03/2009   Qualifier: Diagnosis of  By: [name]   . Anxiety associated with depression 10/11/2013  . Colon cancer ([place])    right colon cancer- adenocarcinoma CEA level isnrmal at 1.8  . Diabetes mellitus   . Diabetes mellitus type 2 in obese [facility]) 03/20/2010   Qualifier: Diagnosis of  By: [name]    . Erectile dysfunction 06/22/2016  . Essential hypertension 06/05/2014  . FATTY LIVER DISEASE 11/04/2009   Qualifier:  Diagnosis of  By: [name] MD, Docia Chuck   Qualifier: Diagnosis of  By: [name] MD, Docia Chuck  Last Assessment & Plan:  conirmed by CT scan of abdomen in April 2016 encouraged to minimize simple carbs and fatty foods.  Marland Kitchen GERD 11/04/2009   Qualifier: Diagnosis of  By: [name] MD, Docia Chuck   . Great toe pain, right 04/18/2017  . Hepatic artery stenosis ([place])   . History of colon cancer 01/02/2010   Qualifier: Diagnosis of  By: [name] Dr [name]  Last Assessment & Plan:  Follows closely with gastroenterology, Dr [name]  . Hyperlipidemia   . Hyperlipidemia, mixed 09/07/2010   Qualifier: Diagnosis of  By: [name]   . Hypertension   . Hypertriglyceridemia 12/14/2010  . Hypotestosteronism 06/22/2016  . INSOMNIA, CHRONIC 03/20/2010   Qualifier: Diagnosis of  By: [name] Ambien 10 mg daily does not keep asleep AdvilPM is over sedating and has trouble waking up   . Iron deficiency anemia 2011  . Low testosterone 06/22/2016  . Malignant neoplasm of colon ([place]) 01/02/2010   Qualifier: Diagnosis of  By: [name] Dr [name]   . Muscle spasm of back 06/05/2014  . Nipple pain 09/30/2016  . Obesity   . Palpitations 06/05/2014  . Preventative health care 10/08/2012  . Psoriasis   . Psoriatic arthritis ([place]) 08/25/2017  . Right knee pain 11/17/2011    Past Surgical History:  Procedure Laterality Date  .  CARPAL TUNNEL RELEASE     RIGHT  . CHOLECYSTECTOMY  1994  . COLONOSCOPY    . CORONARY/GRAFT ACUTE MI REVASCULARIZATION N/A 10/27/2019   Procedure: Coronary/Graft Acute MI Revascularization;  Surgeon: [name], MD;  Location: [facility];  Service: Cardiovascular;  Laterality: N/A;  . HEMICOLECTOMY     12/17/2009 right  . LEFT HEART CATH AND CORONARY ANGIOGRAPHY N/A 10/27/2019   Procedure: LEFT HEART CATH AND CORONARY ANGIOGRAPHY;  Surgeon: [name], MD;  Location: [facility];  Service: Cardiovascular;  Laterality: N/A;  . POLYPECTOMY       Current  Outpatient Medications  Medication Sig Dispense Refill  . aspirin 81 MG chewable tablet Chew 1 tablet (81 mg total) by mouth daily. 30 tablet 1  . Blood Glucose Monitoring Suppl (ONETOUCH VERIO) W/DEVICE KIT   0  . clobetasol cream (TEMOVATE) 0.05 %     . clopidogrel (PLAVIX) 75 MG tablet Take 1 tablet (75 mg total) by mouth daily. 90 tablet 3  . glucose blood (ONETOUCH VERIO) test strip Test three times daily, DX E11.9 300 each 6  . HYDROcodone-acetaminophen (NORCO) 5-325 MG tablet Take 1 tablet by mouth every 6 (six) hours as needed for moderate pain or severe pain. 30 tablet 0  . Insulin Glargine (BASAGLAR KWIKPEN) 100 UNIT/ML Inject 0.6 mLs (60 Units total) into the skin at bedtime. 15 pen 3  . insulin lispro (HUMALOG KWIKPEN) 100 UNIT/ML KwikPen Inject 0.15 mLs (15 Units total) into the skin 3 (three) times daily with meals. 15 mL 11  . Insulin Pen Needle (B-D ULTRAFINE III SHORT PEN) 31G X 8 MM MISC Test as directed three times daily.  DX E11.9 300 each 3  . losartan (COZAAR) 25 MG tablet Take 0.5 tablets (12.5 mg total) by mouth daily. 30 tablet 1  . metFORMIN (GLUCOPHAGE) 1000 MG tablet TAKE 1 TABLET (1,000 MG TOTAL) BY MOUTH 2 (TWO) TIMES DAILY WITH A MEAL. 180 tablet 2  . metoprolol succinate (TOPROL XL) 25 MG 24 hr tablet Take 1 tablet (25 mg total) by mouth daily. 90 tablet 3  . ONETOUCH DELICA LANCETS MISC     . zolpidem (AMBIEN) 10 MG tablet TAKE 1 TABLET(10 MG) BY MOUTH AT BEDTIME AS NEEDED 15 tablet 0   No current facility-administered medications for this visit.    Allergies:   Patient has no known allergies.    Social History:  The patient  reports that he has been smoking cigarettes. He has never used smokeless tobacco. He reports that he does not drink alcohol or use drugs.   Family History:  The patient's family history includes Alcohol abuse in his brother; Diabetes in his brother; Diabetes type II in his brother; Stomach cancer in his mother.    ROS:  Please see the  history of present illness.   Otherwise, review of systems are positive for none.   All other systems are reviewed and negative.    PHYSICAL EXAM: VS:  BP 104/64 (BP Location: Left Arm, Patient Position: Sitting, Cuff Size: Normal)   Pulse 92   Ht '5\' 6"'  (1.676 m)   Wt 223 lb 2 oz (101.2 kg)   SpO2 98%   BMI 36.01 kg/m  , BMI Body mass index is 36.01 kg/m. GEN: Well nourished, well developed, in no acute distress  HEENT: normal  Neck: no JVD, carotid bruits, or masses Cardiac: RRR; no murmurs, rubs, or gallops,no edema  Respiratory:  clear to auscultation bilaterally, normal  work of breathing GI: soft, nontender, nondistended, + BS MS: no deformity or atrophy  Skin: warm and dry, no rash Neuro:  Strength and sensation are intact Psych: euthymic mood, full affect    EKG:  EKG is ordered today. The ekg ordered today demonstrates normal sinus rhythm with low voltage and old anteroseptal infarct.   Recent Labs: 10/29/2019: ALT 65; Magnesium 2.0 11/05/2019: BUN 15; Creatinine, Ser 0.75; Hemoglobin 12.7; Platelets 321; Potassium 3.9; Sodium 138    Lipid Panel    Component Value Date/Time   CHOL 221 (H) 10/27/2019 1406   CHOL 949 (H) 10/27/2014 1322   TRIG 453 (H) 10/27/2019 1406   TRIG 1,098 (H) 10/30/2014 0605   HDL 31 (L) 10/27/2019 1406   HDL 20 (L) 10/27/2014 1322   CHOLHDL 7.1 10/27/2019 1406   VLDL UNABLE TO CALCULATE IF TRIGLYCERIDE OVER 400 mg/dL 10/27/2019 1406   VLDL SEE COMMENT 10/27/2014 1322   LDLCALC UNABLE TO CALCULATE IF TRIGLYCERIDE OVER 400 mg/dL 10/27/2019 1406   LDLCALC SEE COMMENT 10/27/2014 1322   LDLDIRECT 112.7 (H) 10/27/2019 1406      Wt Readings from Last 3 Encounters:  11/20/19 223 lb 2 oz (101.2 kg)  11/19/19 218 lb 3.2 oz (99 kg)  11/06/19 221 lb 12 oz (100.6 kg)        PAD Screen 12/07/2017  Previous PAD dx? No  Previous surgical procedure? No  Pain with walking? No  Feet/toe relief with dangling? Yes  Painful, non-healing ulcers?  No  Extremities discolored? No      ASSESSMENT AND PLAN:  1.  Coronary artery disease involving native coronary arteries without angina: He is feeling better after switching from prasugrel to clopidogrel.  Continue dual antiplatelet therapy for at least 1 year.  Continue aggressive treatment of risk factors.    2.  Chronic systolic heart failure due to ischemic cardiomyopathy: No evidence of significant volume overload.  Ejection fraction was 35 to 40%.  Continue treatment with small dose Toprol and losartan.  Not able to increase her medications due to low blood pressure.    3.  Essential hypertension: Blood pressure is controlled.  4.  Hyperlipidemia: He reports severe myalgia with atorvastatin and thus I elected to switch him to rosuvastatin 10 mg daily.    Disposition:   FU with me in 2 months.  Signed,  Kathlyn Sacramento, MD  11/20/2019 4:34 PM    [facility]

## 2019-11-20 NOTE — Patient Instructions (Signed)
Medication Instructions:  Your physician has recommended you make the following change in your medication:   STOP Atorvastatin  START Rosuvastatin 10 mg daily. An Rx has been sent to your pharmacy.  *If you need a refill on your cardiac medications before your next appointment, please call your pharmacy*   Lab Work: None ordered If you have labs (blood work) drawn today and your tests are completely normal, you will receive your results only by: Marland Kitchen MyChart Message (if you have MyChart) OR . A paper copy in the mail If you have any lab test that is abnormal or we need to change your treatment, we will call you to review the results.   Testing/Procedures: None ordered   Follow-Up: At [facility], you and your health needs are our priority.  As part of our continuing mission to provide you with exceptional heart care, we have created designated Provider Care Teams.  These Care Teams include your primary Cardiologist (physician) and Advanced Practice Providers (APPs -  Physician Assistants and Nurse Practitioners) who all work together to provide you with the care you need, when you need it.  We recommend signing up for the patient portal called "MyChart".  Sign up information is provided on this After Visit Summary.  MyChart is used to connect with patients for Virtual Visits (Telemedicine).  Patients are able to view lab/test results, encounter notes, upcoming appointments, etc.  Non-urgent messages can be sent to your provider as well.   To learn more about what you can do with MyChart, go to [email or web address].    Your next appointment:   2 month(s)  The format for your next appointment:   In Person  Provider:   Kathlyn Sacramento, MD   Other Instructions N/A

## 2019-11-21 DIAGNOSIS — M791 Myalgia, unspecified site: Secondary | ICD-10-CM | POA: Insufficient documentation

## 2019-11-21 NOTE — Assessment & Plan Note (Signed)
Well controlled, no changes to meds. Encouraged heart healthy diet such as the DASH diet and exercise as tolerated.  °

## 2019-11-21 NOTE — Assessment & Plan Note (Signed)
Hydrate well, Tylenol and hydrocodone prn. Low inflammation diet and change statin.

## 2019-11-21 NOTE — Assessment & Plan Note (Addendum)
Patient is complaining of severe myalgias and even some weakness since he had his lipitor increased. He might benefit from a switch to Crestor if he does not tolerate it he may need to be referred to the lipid clinic. For the myalgias he is given a small prescription for hydrocodone to use sparingly

## 2019-11-21 NOTE — Assessment & Plan Note (Signed)
Encouraged DASH diet, decrease po intake and increase exercise as tolerated. Needs 7-8 hours of sleep nightly. Avoid trans fats, eat small, frequent meals every 4-5 hours with lean proteins, complex carbs and healthy fats. Minimize simple carbs and simple carbs

## 2019-11-21 NOTE — Assessment & Plan Note (Signed)
He has been struggling with labile blood sugars and he is using his Basaglar very erratically. He is encouraged to use 10 units daily then 4 units of humalog tid with meals and then a sliding scale  200 take 6 units  250 take 8 units 300 take 10 units 350 take 12 units Call if any concerns and track numbers and report them every week or so. He does not have insurance at this time.

## 2019-11-21 NOTE — Progress Notes (Signed)
Subjective:    Patient ID: [name], male    DOB: [birth year], 59 y.o.   MRN: [id number]  Chief Complaint  Patient presents with  . Hospitalization Follow-up  . legs are getting worse    could not hardley get up off toilet    HPI Patient is in today for follow up on chronic medical concerns. His greatest complaint is muscle pains and weakness most notably in his legs. The increase may correlate with Lipitor use. No recent febrile illness. His sugars have been very labile and he is using his basaglar very erratically. He has struggled with fatigue since his cardiac stent was placed last month but it is some improved. Denies CP/palp/SOB/HA/congestion/fevers/GI or GU c/o. Taking meds as prescribed  Past Medical History:  Diagnosis Date  . Anemia 10/03/2009   Qualifier: Diagnosis of  By: [name]   . Anxiety associated with depression 10/11/2013  . Colon cancer ([place])    right colon cancer- adenocarcinoma CEA level isnrmal at 1.8  . Diabetes mellitus   . Diabetes mellitus type 2 in obese [facility]) 03/20/2010   Qualifier: Diagnosis of  By: [name]    . Erectile dysfunction 06/22/2016  . Essential hypertension 06/05/2014  . FATTY LIVER DISEASE 11/04/2009   Qualifier: Diagnosis of  By: [name] MD, Docia Chuck   Qualifier: Diagnosis of  By: [name] MD, Docia Chuck  Last Assessment & Plan:  conirmed by CT scan of abdomen in April 2016 encouraged to minimize simple carbs and fatty foods.  Marland Kitchen GERD 11/04/2009   Qualifier: Diagnosis of  By: [name] MD, Docia Chuck   . Great toe pain, right 04/18/2017  . Hepatic artery stenosis ([place])   . History of colon cancer 01/02/2010   Qualifier: Diagnosis of  By: [name] Dr [name]  Last Assessment & Plan:  Follows closely with gastroenterology, Dr [name]  . Hyperlipidemia   . Hyperlipidemia, mixed 09/07/2010   Qualifier: Diagnosis of  By: [name]   . Hypertension   . Hypertriglyceridemia 12/14/2010  . Hypotestosteronism 06/22/2016  . INSOMNIA,  CHRONIC 03/20/2010   Qualifier: Diagnosis of  By: [name] Ambien 10 mg daily does not keep asleep AdvilPM is over sedating and has trouble waking up   . Iron deficiency anemia 2011  . Low testosterone 06/22/2016  . Malignant neoplasm of colon ([place]) 01/02/2010   Qualifier: Diagnosis of  By: [name] Dr [name]   . Muscle spasm of back 06/05/2014  . Nipple pain 09/30/2016  . Obesity   . Palpitations 06/05/2014  . Preventative health care 10/08/2012  . Psoriasis   . Psoriatic arthritis ([place]) 08/25/2017  . Right knee pain 11/17/2011    Past Surgical History:  Procedure Laterality Date  . CARPAL TUNNEL RELEASE     RIGHT  . CHOLECYSTECTOMY  1994  . COLONOSCOPY    . CORONARY/GRAFT ACUTE MI REVASCULARIZATION N/A 10/27/2019   Procedure: Coronary/Graft Acute MI Revascularization;  Surgeon: [name], MD;  Location: [facility];  Service: Cardiovascular;  Laterality: N/A;  . HEMICOLECTOMY     12/17/2009 right  . LEFT HEART CATH AND CORONARY ANGIOGRAPHY N/A 10/27/2019   Procedure: LEFT HEART CATH AND CORONARY ANGIOGRAPHY;  Surgeon: [name], MD;  Location: [place];  Service: Cardiovascular;  Laterality: N/A;  . POLYPECTOMY      Family History  Problem Relation Age of Onset  . Stomach cancer Mother  diedinher 60's  . Diabetes type II Brother        boderline  . Diabetes Brother        type II  . Alcohol abuse Brother   . Other Neg Hx        cad,prostate ca, colon ca  . Coronary artery disease Neg Hx   . Cancer Neg Hx        colon, prostate  . Colon cancer Neg Hx   . Esophageal cancer Neg Hx   . Rectal cancer Neg Hx     Social History   Socioeconomic History  . Marital status: Single    Spouse name: [name]  . Number of children: 0  . Years of education: Not on file  . Highest education level: Not on file  Occupational History  . Occupation: Merchant navy officer    Comment: Recruitment consultant  Tobacco Use  . Smoking status: Current Some  Day Smoker    Types: Cigarettes  . Smokeless tobacco: Never Used  Substance and Sexual Activity  . Alcohol use: No  . Drug use: No  . Sexual activity: Yes    Comment: lives with girlfriend, no dietary restrictions  Other Topics Concern  . Not on file  Social History Narrative   Occupation: Theatre stage manager and fax)   Single  (lives with [name])   no children    former smoker   Illicit Drug Use - no    Social Determinants of Radio broadcast assistant Strain:   . Difficulty of Paying Living Expenses:   Food Insecurity:   . Worried About Charity fundraiser in the Last Year:   . Arboriculturist in the Last Year:   Transportation Needs:   . Film/video editor (Medical):   Marland Kitchen Lack of Transportation (Non-Medical):   Physical Activity:   . Days of Exercise per Week:   . Minutes of Exercise per Session:   Stress:   . Feeling of Stress :   Social Connections:   . Frequency of Communication with Friends and Family:   . Frequency of Social Gatherings with Friends and Family:   . Attends Religious Services:   . Active Member of Clubs or Organizations:   . Attends Archivist Meetings:   Marland Kitchen Marital Status:   Intimate Partner Violence:   . Fear of Current or Ex-Partner:   . Emotionally Abused:   Marland Kitchen Physically Abused:   . Sexually Abused:     Outpatient Medications Prior to Visit  Medication Sig Dispense Refill  . aspirin 81 MG chewable tablet Chew 1 tablet (81 mg total) by mouth daily. 30 tablet 1  . Blood Glucose Monitoring Suppl (ONETOUCH VERIO) W/DEVICE KIT   0  . clobetasol cream (TEMOVATE) 0.05 %     . clopidogrel (PLAVIX) 75 MG tablet Take 1 tablet (75 mg total) by mouth daily. 90 tablet 3  . glucose blood (ONETOUCH VERIO) test strip Test three times daily, DX E11.9 300 each 6  . Insulin Glargine (BASAGLAR KWIKPEN) 100 UNIT/ML Inject 0.6 mLs (60 Units total) into the skin at bedtime. 15 pen 3  . insulin lispro (HUMALOG KWIKPEN) 100 UNIT/ML KwikPen Inject  0.15 mLs (15 Units total) into the skin 3 (three) times daily with meals. 15 mL 11  . Insulin Pen Needle (B-D ULTRAFINE III SHORT PEN) 31G X 8 MM MISC Test as directed three times daily.  DX E11.9 300 each 3  . losartan (COZAAR) 25 MG tablet Take  0.5 tablets (12.5 mg total) by mouth daily. 30 tablet 1  . metFORMIN (GLUCOPHAGE) 1000 MG tablet TAKE 1 TABLET (1,000 MG TOTAL) BY MOUTH 2 (TWO) TIMES DAILY WITH A MEAL. 180 tablet 2  . metoprolol succinate (TOPROL XL) 25 MG 24 hr tablet Take 1 tablet (25 mg total) by mouth daily. 90 tablet 3  . [place] LANCETS MISC     . atorvastatin (LIPITOR) 40 MG tablet Take 1 tablet (40 mg total) by mouth daily at 6 PM. 90 tablet 3  . zolpidem (AMBIEN) 10 MG tablet TAKE 1 TABLET(10 MG) BY MOUTH AT BEDTIME AS NEEDED 15 tablet 0  . escitalopram (LEXAPRO) 10 MG tablet Take 1 tablet (10 mg total) by mouth daily. 90 tablet 1  . fenofibrate 160 MG tablet Take 1 tablet (160 mg total) by mouth daily. 90 tablet 3  . HYDROcodone-acetaminophen (NORCO) 5-325 MG tablet Take 1 tablet by mouth every 4 (four) hours as needed for moderate pain or severe pain. (Patient not taking: Reported on 11/19/2019) 30 tablet 0  . pantoprazole (PROTONIX) 40 MG tablet Take 1 tablet (40 mg total) by mouth daily. 30 tablet 2  . Secukinumab (COSENTYX 300 DOSE) 150 MG/ML SOSY Inject 300 mg into the skin once monthly.    Marland Kitchen spironolactone (ALDACTONE) 25 MG tablet Take 0.5 tablets (12.5 mg total) by mouth daily. 30 tablet 1   No facility-administered medications prior to visit.    No Known Allergies  Review of Systems  Constitutional: Positive for malaise/fatigue. Negative for fever.  HENT: Negative for congestion.   Eyes: Negative for blurred vision.  Respiratory: Negative for shortness of breath.   Cardiovascular: Negative for chest pain, palpitations and leg swelling.  Gastrointestinal: Negative for abdominal pain, blood in stool and nausea.  Genitourinary: Negative for dysuria and  frequency.  Musculoskeletal: Positive for myalgias. Negative for falls.  Skin: Negative for rash.  Neurological: Negative for dizziness, loss of consciousness and headaches.  Endo/Heme/Allergies: Negative for environmental allergies.  Psychiatric/Behavioral: Positive for depression. The patient is nervous/anxious.        Objective:    Physical Exam Vitals and nursing note reviewed.  Constitutional:      General: He is not in acute distress.    Appearance: He is well-developed.  HENT:     Head: Normocephalic and atraumatic.     Nose: Nose normal.  Eyes:     General:        Right eye: No discharge.        Left eye: No discharge.  Cardiovascular:     Rate and Rhythm: Normal rate and regular rhythm.     Heart sounds: No murmur.  Pulmonary:     Effort: Pulmonary effort is normal.     Breath sounds: Normal breath sounds.  Abdominal:     General: Bowel sounds are normal.     Palpations: Abdomen is soft.     Tenderness: There is no abdominal tenderness.  Musculoskeletal:     Cervical back: Normal range of motion and neck supple.  Skin:    General: Skin is warm and dry.  Neurological:     Mental Status: He is alert and oriented to person, place, and time.     BP 118/60 (BP Location: Right Arm, Cuff Size: Large)   Pulse 98   Temp 98.8 F (37.1 C) (Temporal)   Resp 12   Ht '5\' 8"'  (1.727 m)   Wt 218 lb 3.2 oz (99 kg)   SpO2 98%  BMI 33.18 kg/m  Wt Readings from Last 3 Encounters:  11/20/19 223 lb 2 oz (101.2 kg)  11/19/19 218 lb 3.2 oz (99 kg)  11/06/19 221 lb 12 oz (100.6 kg)    Diabetic Foot Exam - Simple   No data filed     Lab Results  Component Value Date   WBC 11.8 (H) 11/05/2019   HGB 12.7 (L) 11/05/2019   HCT 37.2 (L) 11/05/2019   PLT 321 11/05/2019   GLUCOSE 211 (H) 11/05/2019   CHOL 221 (H) 10/27/2019   TRIG 453 (H) 10/27/2019   HDL 31 (L) 10/27/2019   LDLDIRECT 112.7 (H) 10/27/2019   LDLCALC UNABLE TO CALCULATE IF TRIGLYCERIDE OVER 400 mg/dL  10/27/2019   ALT 65 (H) 10/29/2019   AST 87 (H) 10/29/2019   NA 138 11/05/2019   K 3.9 11/05/2019   CL 106 11/05/2019   CREATININE 0.75 11/05/2019   BUN 15 11/05/2019   CO2 24 11/05/2019   TSH 2.77 08/25/2018   PSA 0.27 04/18/2017   INR 1.0 10/27/2019   HGBA1C 8.8 (H) 10/27/2019   MICROALBUR 0.50 01/08/2013    Lab Results  Component Value Date   TSH 2.77 08/25/2018   Lab Results  Component Value Date   WBC 11.8 (H) 11/05/2019   HGB 12.7 (L) 11/05/2019   HCT 37.2 (L) 11/05/2019   MCV 95.9 11/05/2019   PLT 321 11/05/2019   Lab Results  Component Value Date   NA 138 11/05/2019   K 3.9 11/05/2019   CO2 24 11/05/2019   GLUCOSE 211 (H) 11/05/2019   BUN 15 11/05/2019   CREATININE 0.75 11/05/2019   BILITOT 1.2 10/29/2019   ALKPHOS 61 10/29/2019   AST 87 (H) 10/29/2019   ALT 65 (H) 10/29/2019   PROT 6.0 (L) 10/29/2019   ALBUMIN 2.9 (L) 10/29/2019   CALCIUM 8.9 11/05/2019   ANIONGAP 8 11/05/2019   GFR 101.53 08/25/2018   Lab Results  Component Value Date   CHOL 221 (H) 10/27/2019   Lab Results  Component Value Date   HDL 31 (L) 10/27/2019   Lab Results  Component Value Date   LDLCALC UNABLE TO CALCULATE IF TRIGLYCERIDE OVER 400 mg/dL 10/27/2019   Lab Results  Component Value Date   TRIG 453 (H) 10/27/2019   Lab Results  Component Value Date   CHOLHDL 7.1 10/27/2019   Lab Results  Component Value Date   HGBA1C 8.8 (H) 10/27/2019       Assessment & Plan:   Problem List Items Addressed This Visit    Obesity    Encouraged DASH diet, decrease po intake and increase exercise as tolerated. Needs 7-8 hours of sleep nightly. Avoid trans fats, eat small, frequent meals every 4-5 hours with lean proteins, complex carbs and healthy fats. Minimize simple carbs and simple carbs      Diabetes mellitus type 2 in obese [facility])    He has been struggling with labile blood sugars and he is using his Basaglar very erratically. He is encouraged to use 10 units daily then  4 units of humalog tid with meals and then a sliding scale  200 take 6 units  250 take 8 units 300 take 10 units 350 take 12 units Call if any concerns and track numbers and report them every week or so. He does not have insurance at this time.      Hyperlipidemia, mixed    Patient is complaining of severe myalgias and even some weakness since he had his  lipitor increased. He might benefit from a switch to Crestor if he does not tolerate it he may need to be referred to the lipid clinic. For the myalgias he is given a small prescription for hydrocodone to use sparingly      Essential hypertension    Well controlled, no changes to meds. Encouraged heart healthy diet such as the DASH diet and exercise as tolerated.       Myalgia - Primary    Hydrate well, Tylenol and hydrocodone prn. Low inflammation diet and change statin.       Relevant Orders   CK (Creatine Kinase) (Completed)    Other Visit Diagnoses    Pain and swelling of right upper extremity       Relevant Medications   HYDROcodone-acetaminophen (NORCO) 5-325 MG tablet      I have discontinued Add Cranmer's Secukinumab, HYDROcodone-acetaminophen, fenofibrate, escitalopram, spironolactone, atorvastatin, pantoprazole, and HYDROcodone-acetaminophen. I have also changed his HYDROcodone-acetaminophen. Additionally, I am having him maintain his OneTouch Delica Lancets, OneTouch Verio, glucose blood, clobetasol cream, metFORMIN, Insulin Pen Needle, zolpidem, aspirin, losartan, Basaglar KwikPen, insulin lispro, clopidogrel, and metoprolol succinate.  Meds ordered this encounter  Medications  . DISCONTD: HYDROcodone-acetaminophen (NORCO) 5-325 MG tablet    Sig: Take 1 tablet by mouth every 4 (four) hours as needed for moderate pain or severe pain.    Dispense:  30 tablet    Refill:  0  . DISCONTD: HYDROcodone-acetaminophen (NORCO) 5-325 MG tablet    Sig: Take 1 tablet by mouth every 4 (four) hours as needed for moderate pain or  severe pain.    Dispense:  30 tablet    Refill:  0  . HYDROcodone-acetaminophen (NORCO) 5-325 MG tablet    Sig: Take 1 tablet by mouth every 6 (six) hours as needed for moderate pain or severe pain.    Dispense:  30 tablet    Refill:  0     Penni Homans, MD

## 2019-11-26 NOTE — Addendum Note (Signed)
Addended by: Othelia Pulling C on: 11/26/2019 08:00 AM   Modules accepted: Orders

## 2019-11-29 NOTE — Addendum Note (Signed)
Addended by: Janan Ridge on: 11/29/2019 08:21 AM   Modules accepted: Orders

## 2019-11-30 DIAGNOSIS — I5022 Chronic systolic (congestive) heart failure: Secondary | ICD-10-CM | POA: Insufficient documentation

## 2019-11-30 DIAGNOSIS — I2510 Atherosclerotic heart disease of native coronary artery without angina pectoris: Secondary | ICD-10-CM | POA: Insufficient documentation

## 2019-12-04 ENCOUNTER — Telehealth: Payer: Self-pay | Admitting: Family Medicine

## 2019-12-14 ENCOUNTER — Encounter: Payer: Self-pay | Admitting: *Deleted

## 2019-12-14 LAB — HM DIABETES EYE EXAM

## 2019-12-27 ENCOUNTER — Other Ambulatory Visit: Payer: Self-pay | Admitting: Family Medicine

## 2019-12-27 NOTE — Telephone Encounter (Signed)
Last written: 06/28/19 Last ov: 06/28/19 Next ov: 01/31/20 Contract: will get at next visit UDS: will get at next visit.

## 2020-01-14 ENCOUNTER — Other Ambulatory Visit: Payer: Self-pay

## 2020-01-14 ENCOUNTER — Ambulatory Visit: Payer: Commercial Managed Care - PPO | Admitting: Pharmacy Technician

## 2020-01-14 DIAGNOSIS — Z79899 Other long term (current) drug therapy: Secondary | ICD-10-CM

## 2020-01-14 NOTE — Progress Notes (Signed)
Patient stated that he now has prescription drug coverage with [employer].  Asked for his prescriptions to be transferred to [facility].  Ellie Lunch to transfer prescriptions for patient.    [facility]

## 2020-01-25 ENCOUNTER — Telehealth: Payer: Self-pay | Admitting: Cardiovascular Disease

## 2020-01-25 MED ORDER — TICAGRELOR 90 MG PO TABS: 90.0000 mg | ORAL_TABLET | Freq: Two times a day (BID) | ORAL | 2 refills | Status: DC

## 2020-01-25 NOTE — Telephone Encounter (Signed)
Patient calling in requesting to speak with Nurse. Pt c/o medication issue:  1. Name of Medication: does not know the name since he is driving at the time  2. How are you currently taking this medication (dosage and times per day)? unknown   3. Are you having a reaction (difficulty breathing--STAT)? States he had a negative reaction with the first blood thinner, now feet are bothering him because of medication.   4. What is your medication issue? aggravating arthrititis in feet Patient stopped taking medication July 3rd and feet feel better now but patient knows he should be taking medication  Patient was offered an earlier visit but declined to see anyone other than [place].   Patient also states he now has insurance and [facility] [address]   Please advise

## 2020-01-25 NOTE — Telephone Encounter (Signed)
Spoke with the patient. Patient sts that he has not taken any of his medications since 01/19/20. Patient sts that the blood thinners he has been prescribed  Exacerbate his psoriatic arthritis. He has been prescribed effient and plavix in the past and had complained that the cause excruciating  pain in his feet.  Reiterated to the patient the importance of DAPT for the first year post MI and that the medications drastically reduce the risk of PCI restenosis and possibly another MI.  Patient has recently started a new job that requires him to be active and on his feet. He has stopped all of his medications on his own and sts that his feet feel better since he has done that.  He is willing to try another anti-platelet if Dr. Fletcher Anon would prescribe it.   Advised the patient that I will talk with Dr. Fletcher Anon and call back with his recommendation.  Per Dr. Fletcher Anon the patient must resume his anit-platelet therapy asap. He is at increased risk for another MI with out it. He can  Prescribe Brilinta 90 mg twice daily, let the patient know that it is a name brand medication. Patient should be instructed to resume his medications asap.   Spoke with the patient and he is agreeable with starting Brilinta 90 mg bid. Rx sent to the patient pharmacy. I have asked him to pick it up and start asap. He is to contact the office if he is not able to get the medication. Adv the patient to keep his scheduled 02/14/20 appt with Dr.Arida.  Patient verbalized understanding and voiced appreciation for the call.

## 2020-01-31 ENCOUNTER — Ambulatory Visit (INDEPENDENT_AMBULATORY_CARE_PROVIDER_SITE_OTHER): Payer: Commercial Managed Care - PPO | Admitting: Family Medicine

## 2020-01-31 ENCOUNTER — Other Ambulatory Visit: Payer: Self-pay

## 2020-01-31 VITALS — BP 116/66 | HR 88 | Temp 97.9°F | Resp 12 | Ht 66.0 in | Wt 233.6 lb

## 2020-01-31 DIAGNOSIS — I10 Essential (primary) hypertension: Secondary | ICD-10-CM

## 2020-01-31 DIAGNOSIS — D649 Anemia, unspecified: Secondary | ICD-10-CM | POA: Diagnosis not present

## 2020-01-31 DIAGNOSIS — M791 Myalgia, unspecified site: Secondary | ICD-10-CM

## 2020-01-31 DIAGNOSIS — M7989 Other specified soft tissue disorders: Secondary | ICD-10-CM

## 2020-01-31 DIAGNOSIS — E669 Obesity, unspecified: Secondary | ICD-10-CM | POA: Diagnosis not present

## 2020-01-31 DIAGNOSIS — I252 Old myocardial infarction: Secondary | ICD-10-CM

## 2020-01-31 DIAGNOSIS — M79601 Pain in right arm: Secondary | ICD-10-CM | POA: Diagnosis not present

## 2020-01-31 DIAGNOSIS — L4050 Arthropathic psoriasis, unspecified: Secondary | ICD-10-CM

## 2020-01-31 DIAGNOSIS — R351 Nocturia: Secondary | ICD-10-CM

## 2020-01-31 DIAGNOSIS — Z79899 Other long term (current) drug therapy: Secondary | ICD-10-CM

## 2020-01-31 DIAGNOSIS — E782 Mixed hyperlipidemia: Secondary | ICD-10-CM | POA: Diagnosis not present

## 2020-01-31 DIAGNOSIS — E1169 Type 2 diabetes mellitus with other specified complication: Secondary | ICD-10-CM | POA: Diagnosis not present

## 2020-01-31 LAB — LIPID PANEL
Cholesterol: 149 mg/dL (ref 0–200)
HDL: 29.7 mg/dL — ABNORMAL LOW (ref >39.00)
NonHDL: 119.27
Total CHOL/HDL Ratio: 5
Triglycerides: 225 mg/dL — ABNORMAL HIGH (ref 0.0–149.0)
VLDL: 45 mg/dL — ABNORMAL HIGH (ref 0.0–40.0)

## 2020-01-31 LAB — COMPREHENSIVE METABOLIC PANEL
ALT: 21 U/L (ref 0–53)
AST: 15 U/L (ref 0–37)
Albumin: 3.9 g/dL (ref 3.5–5.2)
Alkaline Phosphatase: 48 U/L (ref 39–117)
BUN: 18 mg/dL (ref 6–23)
CO2: 24 mEq/L (ref 19–32)
Calcium: 8.7 mg/dL (ref 8.4–10.5)
Chloride: 106 mEq/L (ref 96–112)
Creatinine, Ser: 0.66 mg/dL (ref 0.40–1.50)
GFR: 124.3 mL/min (ref >60.00)
Glucose, Bld: 82 mg/dL (ref 70–99)
Potassium: 3.8 mEq/L (ref 3.5–5.1)
Sodium: 138 mEq/L (ref 135–145)
Total Bilirubin: 0.9 mg/dL (ref 0.2–1.2)
Total Protein: 6.4 g/dL (ref 6.0–8.3)

## 2020-01-31 LAB — CBC
HCT: 39.3 % (ref 39.0–52.0)
Hemoglobin: 13.4 g/dL (ref 13.0–17.0)
MCHC: 34.2 g/dL (ref 30.0–36.0)
MCV: 92.6 fl (ref 78.0–100.0)
Platelets: 297 10*3/uL (ref 150.0–400.0)
RBC: 4.24 Mil/uL (ref 4.22–5.81)
RDW: 13.8 % (ref 11.5–15.5)
WBC: 8.3 10*3/uL (ref 4.0–10.5)

## 2020-01-31 LAB — TSH: TSH: 3.38 u[IU]/mL (ref 0.35–4.50)

## 2020-01-31 LAB — HEMOGLOBIN A1C: Hgb A1c MFr Bld: 7.5 % — ABNORMAL HIGH (ref 4.6–6.5)

## 2020-01-31 LAB — LDL CHOLESTEROL, DIRECT: Direct LDL: 71 mg/dL

## 2020-01-31 MED ORDER — HYDROCODONE-ACETAMINOPHEN 5-325 MG PO TABS: 1.0000 | ORAL_TABLET | Freq: Four times a day (QID) | ORAL | 0 refills | Status: DC | PRN

## 2020-01-31 MED ORDER — ZOLPIDEM TARTRATE 10 MG PO TABS: ORAL_TABLET | ORAL | 5 refills | Status: DC

## 2020-01-31 MED ORDER — LOSARTAN POTASSIUM 25 MG PO TABS: 12.5000 mg | ORAL_TABLET | Freq: Every day | ORAL | 1 refills | Status: DC

## 2020-01-31 MED ORDER — DIAZEPAM 5 MG PO TABS: 5.0000 mg | ORAL_TABLET | Freq: Every day | ORAL | 2 refills | Status: DC | PRN

## 2020-01-31 MED ORDER — CELECOXIB 200 MG PO CAPS
200.0000 mg | ORAL_CAPSULE | Freq: Two times a day (BID) | ORAL | 3 refills | Status: DC | PRN
Start: 2020-01-31 — End: 2020-02-27

## 2020-01-31 NOTE — Assessment & Plan Note (Signed)
Struggles with daily bilateral foot pain. He has a rheumatologist, Dr Tamera Punt with [facility] and his meds help but patient has been between jobs and unable to afford meds so has been taking Naproxen OTC up to 7 tabs a day. We will try Celebrex 200 mg po bid to try he is aware of the risks and benefits.

## 2020-01-31 NOTE — Assessment & Plan Note (Signed)
Well controlled, no changes to meds. Encouraged heart healthy diet such as the DASH diet and exercise as tolerated.  °

## 2020-01-31 NOTE — Assessment & Plan Note (Signed)
Tolerating statin, encouraged heart healthy diet, avoid trans fats, minimize simple carbs and saturated fats. Increase exercise as tolerated 

## 2020-01-31 NOTE — Assessment & Plan Note (Signed)
hgba1c acceptable, minimize si.mple carbs. Increase exercise as tolerated. Continue current meds he has decreased his carb intake and is exercising more. He notes weight loss since last visit

## 2020-01-31 NOTE — Assessment & Plan Note (Signed)
Doing well. He had stopped his blood thinner due to pain but is now on Brilinta and doing well. No cigarette smoking since attack

## 2020-01-31 NOTE — Progress Notes (Signed)
Subjective:    Patient ID: [name], male    DOB: Dec 09, 1960, 59 y.o.   MRN: [id number]  Chief Complaint  Patient presents with  . Follow-up    HPI Patient is in today for follow up on chronic medical concerns. No recent febrile illness or hospitalizations. He is feeling much better now that he has a new job. He only complains of bilateral foot pain. He is following with rheumatology for his psoriatic arthritis in his feet. He is unable to afford the pain medication prescribed by them at this time. He is on his feet many hours a day on concrete. Denies CP/palp/SOB/HA/congestion/fevers/GI or GU c/o. Taking meds as prescribed. His sugars are much better, he is eating well and is more active.   Past Medical History:  Diagnosis Date  . Anemia 10/03/2009   Qualifier: Diagnosis of  By: [name]   . Anxiety associated with depression 10/11/2013  . Colon cancer ([place])    right colon cancer- adenocarcinoma CEA level isnrmal at 1.8  . Diabetes mellitus   . Diabetes mellitus type 2 in obese [facility]) 03/20/2010   Qualifier: Diagnosis of  By: [name]    . Erectile dysfunction 06/22/2016  . Essential hypertension 06/05/2014  . FATTY LIVER DISEASE 11/04/2009   Qualifier: Diagnosis of  By: [name] MD, Docia Chuck   Qualifier: Diagnosis of  By: [name] MD, Docia Chuck  Last Assessment & Plan:  conirmed by CT scan of abdomen in April 2016 encouraged to minimize simple carbs and fatty foods.  Marland Kitchen GERD 11/04/2009   Qualifier: Diagnosis of  By: [name] MD, Docia Chuck   . Great toe pain, right 04/18/2017  . Hepatic artery stenosis ([place])   . History of colon cancer 01/02/2010   Qualifier: Diagnosis of  By: [name] Dr [name]  Last Assessment & Plan:  Follows closely with gastroenterology, Dr [name]  . Hyperlipidemia   . Hyperlipidemia, mixed 09/07/2010   Qualifier: Diagnosis of  By: [name]   . Hypertension   . Hypertriglyceridemia 12/14/2010  . Hypotestosteronism 06/22/2016  . INSOMNIA, CHRONIC  03/20/2010   Qualifier: Diagnosis of  By: [name] Ambien 10 mg daily does not keep asleep AdvilPM is over sedating and has trouble waking up   . Iron deficiency anemia 2011  . Low testosterone 06/22/2016  . Malignant neoplasm of colon ([place]) 01/02/2010   Qualifier: Diagnosis of  By: [name] Dr [name]   . Muscle spasm of back 06/05/2014  . Nipple pain 09/30/2016  . Obesity   . Palpitations 06/05/2014  . Preventative health care 10/08/2012  . Psoriasis   . Psoriatic arthritis ([place]) 08/25/2017  . Right knee pain 11/17/2011    Past Surgical History:  Procedure Laterality Date  . CARPAL TUNNEL RELEASE     RIGHT  . CHOLECYSTECTOMY  1994  . COLONOSCOPY    . CORONARY/GRAFT ACUTE MI REVASCULARIZATION N/A 10/27/2019   Procedure: Coronary/Graft Acute MI Revascularization;  Surgeon: [name], MD;  Location: [place] [facility];  Service: Cardiovascular;  Laterality: N/A;  . HEMICOLECTOMY     12/17/2009 right  . LEFT HEART CATH AND CORONARY ANGIOGRAPHY N/A 10/27/2019   Procedure: LEFT HEART CATH AND CORONARY ANGIOGRAPHY;  Surgeon: [name], MD;  Location: [place] [facility];  Service: Cardiovascular;  Laterality: N/A;  . POLYPECTOMY      Family History  Problem Relation Age of Onset  . Stomach  cancer Mother        diedinher 46's  . Diabetes type II Brother        boderline  . Diabetes Brother        type II  . Alcohol abuse Brother   . Other Neg Hx        cad,prostate ca, colon ca  . Coronary artery disease Neg Hx   . Cancer Neg Hx        colon, prostate  . Colon cancer Neg Hx   . Esophageal cancer Neg Hx   . Rectal cancer Neg Hx     Social History   Socioeconomic History  . Marital status: Single    Spouse name: [name]  . Number of children: 0  . Years of education: Not on file  . Highest education level: Not on file  Occupational History  . Occupation: Merchant navy officer    Comment: Recruitment consultant  Tobacco Use  . Smoking status: Current Some Day  Smoker    Types: Cigarettes  . Smokeless tobacco: Never Used  Vaping Use  . Vaping Use: Never used  Substance and Sexual Activity  . Alcohol use: No  . Drug use: No  . Sexual activity: Yes    Comment: lives with girlfriend, no dietary restrictions  Other Topics Concern  . Not on file  Social History Narrative   Occupation: Theatre stage manager and fax)   Single  (lives with [name])   no children    former smoker   Illicit Drug Use - no    Social Determinants of Radio broadcast assistant Strain:   . Difficulty of Paying Living Expenses:   Food Insecurity:   . Worried About Charity fundraiser in the Last Year:   . Arboriculturist in the Last Year:   Transportation Needs:   . Film/video editor (Medical):   Marland Kitchen Lack of Transportation (Non-Medical):   Physical Activity:   . Days of Exercise per Week:   . Minutes of Exercise per Session:   Stress:   . Feeling of Stress :   Social Connections:   . Frequency of Communication with Friends and Family:   . Frequency of Social Gatherings with Friends and Family:   . Attends Religious Services:   . Active Member of Clubs or Organizations:   . Attends Archivist Meetings:   Marland Kitchen Marital Status:   Intimate Partner Violence:   . Fear of Current or Ex-Partner:   . Emotionally Abused:   Marland Kitchen Physically Abused:   . Sexually Abused:     Outpatient Medications Prior to Visit  Medication Sig Dispense Refill  . aspirin 81 MG chewable tablet Chew 1 tablet (81 mg total) by mouth daily. 30 tablet 1  . Blood Glucose Monitoring Suppl (ONETOUCH VERIO) W/DEVICE KIT   0  . clobetasol cream (TEMOVATE) 0.05 %     . glucose blood (ONETOUCH VERIO) test strip Test three times daily, DX E11.9 300 each 6  . Insulin Glargine (BASAGLAR KWIKPEN) 100 UNIT/ML Inject 0.6 mLs (60 Units total) into the skin at bedtime. 15 pen 3  . insulin lispro (HUMALOG KWIKPEN) 100 UNIT/ML KwikPen Inject 0.15 mLs (15 Units total) into the skin 3 (three) times  daily with meals. 15 mL 11  . Insulin Pen Needle (B-D ULTRAFINE III SHORT PEN) 31G X 8 MM MISC Test as directed three times daily.  DX E11.9 300 each 3  . metoprolol succinate (TOPROL XL) 25 MG 24  hr tablet Take 1 tablet (25 mg total) by mouth daily. 90 tablet 3  . [place] LANCETS MISC     . ticagrelor (BRILINTA) 90 MG TABS tablet Take 1 tablet (90 mg total) by mouth 2 (two) times daily. 60 tablet 2  . diazepam (VALIUM) 5 MG tablet TAKE 1 TABLET(5 MG) BY MOUTH AT BEDTIME AS NEEDED FOR ANXIETY 30 tablet 2  . HYDROcodone-acetaminophen (NORCO) 5-325 MG tablet Take 1 tablet by mouth every 6 (six) hours as needed for moderate pain or severe pain. 30 tablet 0  . losartan (COZAAR) 25 MG tablet Take 0.5 tablets (12.5 mg total) by mouth daily. 30 tablet 1  . zolpidem (AMBIEN) 10 MG tablet TAKE 1 TABLET(10 MG) BY MOUTH AT BEDTIME AS NEEDED 15 tablet 0  . rosuvastatin (CRESTOR) 10 MG tablet Take 1 tablet (10 mg total) by mouth daily. (Patient not taking: Reported on 01/31/2020) 90 tablet 1  . metFORMIN (GLUCOPHAGE) 1000 MG tablet TAKE 1 TABLET (1,000 MG TOTAL) BY MOUTH 2 (TWO) TIMES DAILY WITH A MEAL. 180 tablet 2   No facility-administered medications prior to visit.    No Known Allergies  Review of Systems  Constitutional: Negative for fever and malaise/fatigue.  HENT: Negative for congestion.   Eyes: Negative for blurred vision.  Respiratory: Negative for shortness of breath.   Cardiovascular: Negative for chest pain, palpitations and leg swelling.  Gastrointestinal: Negative for abdominal pain, blood in stool and nausea.  Genitourinary: Negative for dysuria and frequency.  Musculoskeletal: Positive for joint pain. Negative for falls.  Skin: Negative for rash.  Neurological: Negative for dizziness, loss of consciousness and headaches.  Endo/Heme/Allergies: Negative for environmental allergies.  Psychiatric/Behavioral: Negative for depression. The patient is not nervous/anxious.          Objective:    Physical Exam Vitals and nursing note reviewed.  Constitutional:      General: He is not in acute distress.    Appearance: He is well-developed.  HENT:     Head: Normocephalic and atraumatic.     Nose: Nose normal.  Eyes:     General:        Right eye: No discharge.        Left eye: No discharge.  Cardiovascular:     Rate and Rhythm: Normal rate and regular rhythm.     Heart sounds: No murmur heard.   Pulmonary:     Effort: Pulmonary effort is normal.     Breath sounds: Normal breath sounds.  Abdominal:     General: Bowel sounds are normal.     Palpations: Abdomen is soft.     Tenderness: There is no abdominal tenderness.  Musculoskeletal:     Cervical back: Normal range of motion and neck supple.  Skin:    General: Skin is warm and dry.  Neurological:     Mental Status: He is alert and oriented to person, place, and time.     BP 116/66 (BP Location: Right Arm, Cuff Size: Large)   Pulse 88   Temp 97.9 F (36.6 C) (Temporal)   Resp 12   Ht '5\' 6"'  (1.676 m)   Wt 233 lb 9.6 oz (106 kg)   SpO2 98%   BMI 37.70 kg/m  Wt Readings from Last 3 Encounters:  01/31/20 233 lb 9.6 oz (106 kg)  11/20/19 223 lb 2 oz (101.2 kg)  11/19/19 218 lb 3.2 oz (99 kg)    Diabetic Foot Exam - Simple   No data filed  Lab Results  Component Value Date   WBC 11.8 (H) 11/05/2019   HGB 12.7 (L) 11/05/2019   HCT 37.2 (L) 11/05/2019   PLT 321 11/05/2019   GLUCOSE 211 (H) 11/05/2019   CHOL 221 (H) 10/27/2019   TRIG 453 (H) 10/27/2019   HDL 31 (L) 10/27/2019   LDLDIRECT 112.7 (H) 10/27/2019   LDLCALC UNABLE TO CALCULATE IF TRIGLYCERIDE OVER 400 mg/dL 10/27/2019   ALT 65 (H) 10/29/2019   AST 87 (H) 10/29/2019   NA 138 11/05/2019   K 3.9 11/05/2019   CL 106 11/05/2019   CREATININE 0.75 11/05/2019   BUN 15 11/05/2019   CO2 24 11/05/2019   TSH 2.77 08/25/2018   PSA 0.27 04/18/2017   INR 1.0 10/27/2019   HGBA1C 8.8 (H) 10/27/2019   MICROALBUR 0.50 01/08/2013     Lab Results  Component Value Date   TSH 2.77 08/25/2018   Lab Results  Component Value Date   WBC 11.8 (H) 11/05/2019   HGB 12.7 (L) 11/05/2019   HCT 37.2 (L) 11/05/2019   MCV 95.9 11/05/2019   PLT 321 11/05/2019   Lab Results  Component Value Date   NA 138 11/05/2019   K 3.9 11/05/2019   CO2 24 11/05/2019   GLUCOSE 211 (H) 11/05/2019   BUN 15 11/05/2019   CREATININE 0.75 11/05/2019   BILITOT 1.2 10/29/2019   ALKPHOS 61 10/29/2019   AST 87 (H) 10/29/2019   ALT 65 (H) 10/29/2019   PROT 6.0 (L) 10/29/2019   ALBUMIN 2.9 (L) 10/29/2019   CALCIUM 8.9 11/05/2019   ANIONGAP 8 11/05/2019   GFR 101.53 08/25/2018   Lab Results  Component Value Date   CHOL 221 (H) 10/27/2019   Lab Results  Component Value Date   HDL 31 (L) 10/27/2019   Lab Results  Component Value Date   LDLCALC UNABLE TO CALCULATE IF TRIGLYCERIDE OVER 400 mg/dL 10/27/2019   Lab Results  Component Value Date   TRIG 453 (H) 10/27/2019   Lab Results  Component Value Date   CHOLHDL 7.1 10/27/2019   Lab Results  Component Value Date   HGBA1C 8.8 (H) 10/27/2019       Assessment & Plan:   Problem List Items Addressed This Visit    Anemia   Diabetes mellitus type 2 in obese ([place])    hgba1c acceptable, minimize si.mple carbs. Increase exercise as tolerated. Continue current meds he has decreased his carb intake and is exercising more. He notes weight loss since last visit      Relevant Medications   losartan (COZAAR) 25 MG tablet   Other Relevant Orders   Hemoglobin A1c   Hemoglobin A1c   Hemoglobin A1c   Hyperlipidemia, mixed    Tolerating statin, encouraged heart healthy diet, avoid trans fats, minimize simple carbs and saturated fats. Increase exercise as tolerated      Relevant Medications   losartan (COZAAR) 25 MG tablet   Other Relevant Orders   Lipid panel   Lipid panel   Essential hypertension    Well controlled, no changes to meds. Encouraged heart healthy diet such as the  DASH diet and exercise as tolerated.       Relevant Medications   losartan (COZAAR) 25 MG tablet   Other Relevant Orders   CBC   Comprehensive metabolic panel   TSH   CBC   Comprehensive metabolic panel   TSH   Psoriatic arthritis ([place])    Struggles with daily bilateral foot pain. He has a rheumatologist,  Dr Tamera Punt with [facility] and his meds help but patient has been between jobs and unable to afford meds so has been taking Naproxen OTC up to 7 tabs a day. We will try Celebrex 200 mg po bid to try he is aware of the risks and benefits.       Relevant Medications   HYDROcodone-acetaminophen (NORCO) 5-325 MG tablet   celecoxib (CELEBREX) 200 MG capsule   History of ST elevation myocardial infarction (STEMI)    Doing well. He had stopped his blood thinner due to pain but is now on Brilinta and doing well. No cigarette smoking since attack      Myalgia - Primary   Relevant Orders   DRUG MONITORING, PANEL 8 WITH CONFIRMATION, URINE    Other Visit Diagnoses    High risk medication use       Relevant Orders   DRUG MONITORING, PANEL 8 WITH CONFIRMATION, URINE   Pain and swelling of right upper extremity       Relevant Medications   HYDROcodone-acetaminophen (NORCO) 5-325 MG tablet   Nocturia       Relevant Orders   PSA      I have discontinued Ausar Aldaz's metFORMIN. I have also changed his diazepam. Additionally, I am having him start on celecoxib. Lastly, I am having him maintain his OneTouch [place] Lancets, OneTouch Verio, glucose blood, clobetasol cream, Insulin Pen Needle, aspirin, Basaglar KwikPen, insulin lispro, metoprolol succinate, rosuvastatin, ticagrelor, zolpidem, losartan, and HYDROcodone-acetaminophen.  Meds ordered this encounter  Medications  . diazepam (VALIUM) 5 MG tablet    Sig: Take 1 tablet (5 mg total) by mouth daily as needed for anxiety.    Dispense:  30 tablet    Refill:  2  . zolpidem (AMBIEN) 10 MG tablet    Sig: TAKE 1 TABLET(10 MG) BY MOUTH AT  BEDTIME AS NEEDED    Dispense:  15 tablet    Refill:  5  . losartan (COZAAR) 25 MG tablet    Sig: Take 0.5 tablets (12.5 mg total) by mouth daily.    Dispense:  45 tablet    Refill:  1  . HYDROcodone-acetaminophen (NORCO) 5-325 MG tablet    Sig: Take 1 tablet by mouth every 6 (six) hours as needed for moderate pain or severe pain.    Dispense:  30 tablet    Refill:  0  . celecoxib (CELEBREX) 200 MG capsule    Sig: Take 1 capsule (200 mg total) by mouth 2 (two) times daily as needed.    Dispense:  60 capsule    Refill:  3     Penni Homans, MD

## 2020-01-31 NOTE — Patient Instructions (Signed)
Carbohydrate Counting for Diabetes Mellitus, Adult  Carbohydrate counting is a method of keeping track of how many carbohydrates you eat. Eating carbohydrates naturally increases the amount of sugar (glucose) in the blood. Counting how many carbohydrates you eat helps keep your blood glucose within normal limits, which helps you manage your diabetes (diabetes mellitus). It is important to know how many carbohydrates you can safely have in each meal. This is different for every person. A diet and nutrition specialist (registered dietitian) can help you make a meal plan and calculate how many carbohydrates you should have at each meal and snack. Carbohydrates are found in the following foods:  Grains, such as breads and cereals.  Dried beans and soy products.  Starchy vegetables, such as potatoes, peas, and corn.  Fruit and fruit juices.  Milk and yogurt.  Sweets and snack foods, such as cake, cookies, candy, chips, and soft drinks. How do I count carbohydrates? There are two ways to count carbohydrates in food. You can use either of the methods or a combination of both. Reading "Nutrition Facts" on packaged food The "Nutrition Facts" list is included on the labels of almost all packaged foods and beverages in the [place] It includes:  The serving size.  Information about nutrients in each serving, including the grams (g) of carbohydrate per serving. To use the "Nutrition Facts":  Decide how many servings you will have.  Multiply the number of servings by the number of carbohydrates per serving.  The resulting number is the total amount of carbohydrates that you will be having. Learning standard serving sizes of other foods When you eat carbohydrate foods that are not packaged or do not include "Nutrition Facts" on the label, you need to measure the servings in order to count the amount of carbohydrates:  Measure the foods that you will eat with a food scale or measuring cup, if  needed.  Decide how many standard-size servings you will eat.  Multiply the number of servings by 15. Most carbohydrate-rich foods have about 15 g of carbohydrates per serving. ? For example, if you eat 8 oz (170 g) of strawberries, you will have eaten 2 servings and 30 g of carbohydrates (2 servings x 15 g = 30 g).  For foods that have more than one food mixed, such as soups and casseroles, you must count the carbohydrates in each food that is included. The following list contains standard serving sizes of common carbohydrate-rich foods. Each of these servings has about 15 g of carbohydrates:   hamburger bun or  English muffin.   oz (15 mL) syrup.   oz (14 g) jelly.  1 slice of bread.  1 six-inch tortilla.  3 oz (85 g) cooked rice or pasta.  4 oz (113 g) cooked dried beans.  4 oz (113 g) starchy vegetable, such as peas, corn, or potatoes.  4 oz (113 g) hot cereal.  4 oz (113 g) mashed potatoes or  of a large baked potato.  4 oz (113 g) canned or frozen fruit.  4 oz (120 mL) fruit juice.  4-6 crackers.  6 chicken nuggets.  6 oz (170 g) unsweetened dry cereal.  6 oz (170 g) plain fat-free yogurt or yogurt sweetened with artificial sweeteners.  8 oz (240 mL) milk.  8 oz (170 g) fresh fruit or one small piece of fruit.  24 oz (680 g) popped popcorn. Example of carbohydrate counting Sample meal  3 oz (85 g) chicken breast.  6 oz (170 g)   brown rice.  4 oz (113 g) corn.  8 oz (240 mL) milk.  8 oz (170 g) strawberries with sugar-free whipped topping. Carbohydrate calculation 1. Identify the foods that contain carbohydrates: ? Rice. ? Corn. ? Milk. ? Strawberries. 2. Calculate how many servings you have of each food: ? 2 servings rice. ? 1 serving corn. ? 1 serving milk. ? 1 serving strawberries. 3. Multiply each number of servings by 15 g: ? 2 servings rice x 15 g = 30 g. ? 1 serving corn x 15 g = 15 g. ? 1 serving milk x 15 g = 15 g. ? 1  serving strawberries x 15 g = 15 g. 4. Add together all of the amounts to find the total grams of carbohydrates eaten: ? 30 g + 15 g + 15 g + 15 g = 75 g of carbohydrates total. Summary  Carbohydrate counting is a method of keeping track of how many carbohydrates you eat.  Eating carbohydrates naturally increases the amount of sugar (glucose) in the blood.  Counting how many carbohydrates you eat helps keep your blood glucose within normal limits, which helps you manage your diabetes.  A diet and nutrition specialist (registered dietitian) can help you make a meal plan and calculate how many carbohydrates you should have at each meal and snack. This information is not intended to replace advice given to you by your health care provider. Make sure you discuss any questions you have with your health care provider. Document Revised: 01/27/2017 Document Reviewed: 12/17/2015 Elsevier Patient Education  2020 Elsevier Inc.  

## 2020-02-02 ENCOUNTER — Encounter: Payer: Self-pay | Admitting: Family Medicine

## 2020-02-02 LAB — DRUG MONITORING, PANEL 8 WITH CONFIRMATION, URINE
6 Acetylmorphine: NEGATIVE ng/mL (ref <10)
Alcohol Metabolites: NEGATIVE ng/mL
Alphahydroxyalprazolam: NEGATIVE ng/mL (ref <25)
Alphahydroxymidazolam: NEGATIVE ng/mL (ref <50)
Alphahydroxytriazolam: NEGATIVE ng/mL (ref <50)
Aminoclonazepam: NEGATIVE ng/mL (ref <25)
Amphetamines: NEGATIVE ng/mL (ref <500)
Benzodiazepines: POSITIVE ng/mL — AB (ref <100)
Buprenorphine, Urine: NEGATIVE ng/mL (ref <5)
Cocaine Metabolite: NEGATIVE ng/mL (ref <150)
Creatinine: 154.6 mg/dL
Hydroxyethylflurazepam: NEGATIVE ng/mL (ref <50)
Lorazepam: NEGATIVE ng/mL (ref <50)
MDMA: NEGATIVE ng/mL (ref <500)
Marijuana Metabolite: NEGATIVE ng/mL (ref <20)
Nordiazepam: 108 ng/mL — ABNORMAL HIGH (ref <50)
Opiates: NEGATIVE ng/mL (ref <100)
Oxazepam: 370 ng/mL — ABNORMAL HIGH (ref <50)
Oxidant: NEGATIVE ug/mL
Oxycodone: NEGATIVE ng/mL (ref <100)
Temazepam: 146 ng/mL — ABNORMAL HIGH (ref <50)
pH: 5.8 (ref 4.5–9.0)

## 2020-02-02 LAB — DM TEMPLATE

## 2020-02-04 MED ORDER — INSULIN LISPRO (1 UNIT DIAL) 100 UNIT/ML (KWIKPEN): 15.0000 [IU] | PEN_INJECTOR | Freq: Three times a day (TID) | SUBCUTANEOUS | 5 refills | Status: DC

## 2020-02-05 ENCOUNTER — Telehealth: Payer: Self-pay

## 2020-02-05 NOTE — Telephone Encounter (Signed)
Make sure he has no objection and then we can change to a Novolog pen with the same sig. Confirm he is still taking the same amount prescribed.

## 2020-02-05 NOTE — Telephone Encounter (Signed)
Sheketia- I have tried initiating PA for this Pt and medication but Covermymeds can not verify insurance w/ his plan. See KEY: [id number].

## 2020-02-05 NOTE — Telephone Encounter (Signed)
Insurance will cover Novolog.  Will you change?

## 2020-02-06 MED ORDER — NOVOLOG FLEXPEN 100 UNIT/ML ~~LOC~~ SOPN: 15.0000 [IU] | PEN_INJECTOR | Freq: Three times a day (TID) | SUBCUTANEOUS | 5 refills | Status: DC

## 2020-02-06 NOTE — Telephone Encounter (Signed)
Patient agreed to change. He stated that he takes medication based on what his sugar is and leave directions as is.

## 2020-02-06 NOTE — Telephone Encounter (Signed)
See phone note

## 2020-02-14 ENCOUNTER — Ambulatory Visit (INDEPENDENT_AMBULATORY_CARE_PROVIDER_SITE_OTHER): Payer: Commercial Managed Care - PPO | Admitting: Cardiovascular Disease

## 2020-02-14 ENCOUNTER — Other Ambulatory Visit: Payer: Self-pay

## 2020-02-14 VITALS — BP 130/68 | HR 90 | Ht 68.0 in | Wt 235.4 lb

## 2020-02-14 DIAGNOSIS — I10 Essential (primary) hypertension: Secondary | ICD-10-CM | POA: Diagnosis not present

## 2020-02-14 DIAGNOSIS — I2510 Atherosclerotic heart disease of native coronary artery without angina pectoris: Secondary | ICD-10-CM

## 2020-02-14 DIAGNOSIS — E782 Mixed hyperlipidemia: Secondary | ICD-10-CM | POA: Diagnosis not present

## 2020-02-14 DIAGNOSIS — I5022 Chronic systolic (congestive) heart failure: Secondary | ICD-10-CM | POA: Diagnosis not present

## 2020-02-14 MED ORDER — LOSARTAN POTASSIUM 25 MG PO TABS: 25.0000 mg | ORAL_TABLET | Freq: Every day | ORAL | 2 refills | Status: DC

## 2020-02-14 NOTE — Patient Instructions (Signed)
Medication Instructions:  Your physician has recommended you make the following change in your medication:   INCREASE Losartan 25 mg daily. An Rx has been sent to your pharmacy.  *If you need a refill on your cardiac medications before your next appointment, please call your pharmacy*   Lab Work: None ordered If you have labs (blood work) drawn today and your tests are completely normal, you will receive your results only by: Marland Kitchen MyChart Message (if you have MyChart) OR . A paper copy in the mail If you have any lab test that is abnormal or we need to change your treatment, we will call you to review the results.   Testing/Procedures: None ordered   Follow-Up: At [facility], you and your health needs are our priority.  As part of our continuing mission to provide you with exceptional heart care, we have created designated Provider Care Teams.  These Care Teams include your primary Cardiologist (physician) and Advanced Practice Providers (APPs -  Physician Assistants and Nurse Practitioners) who all work together to provide you with the care you need, when you need it.  We recommend signing up for the patient portal called "MyChart".  Sign up information is provided on this After Visit Summary.  MyChart is used to connect with patients for Virtual Visits (Telemedicine).  Patients are able to view lab/test results, encounter notes, upcoming appointments, etc.  Non-urgent messages can be sent to your provider as well.   To learn more about what you can do with MyChart, go to [email or web address].    Your next appointment:   6 month(s)  The format for your next appointment:   In Person  Provider:    You may see Dr. Fletcher Anon or one of the following Advanced Practice Providers on your designated Care Team:    Murray Hodgkins, NP  Christell Faith, PA-C  Marrianne Mood, PA-C    Other Instructions N/A

## 2020-02-14 NOTE — Progress Notes (Signed)
Cardiology Office Note   Date:  02/14/2020   ID:  [name], DOB 1961-03-10, MRN [id number]  PCP:  Mosie Lukes, MD  Cardiologist:   Kathlyn Sacramento, MD   Chief Complaint  Patient presents with  . other    2 month follow up. Meds reviewed by the pt. verbally. "doing well."       History of Present Illness: [name] is a 59 y.o. male who presents for for follow-up visit regarding coronary artery disease and chronic systolic heart failure due to ischemic cardiomyopathy.  He has known history of diabetes mellitus, essential hypertension, hyperlipidemia and tobacco use. He presented in April  with anterior ST elevation myocardial infarction.  I proceeded with emergent cardiac catheterization which showed an occluded proximal LAD.  This was treated successfully with PCI and drug-eluting stent placement.  There was residual nonocclusive thrombus in the first diagonal which was treated with Aggrastat infusion for 18 hours.  And his LVEDP was 35 and he was given 1 dose of IV furosemide.  Echocardiogram showed an EF of 35 to 40%.  He did not tolerate prasugrel and was switched to clopidogrel with subsequent improvement in symptoms  He quit smoking since his cardiac event.  During last visit he was switched from carvedilol to Toprol due to persistent tachycardia and shortness of breath.  He had myalgia with atorvastatin but improved after switching to rosuvastatin.  He temporarily stopped taking Plavix recently as he thought it was aggravating his psoriasis arthritis.  We explained to him the this is an unlikely side effect.  We elected to switch him to Brilinta.  He is doing well at the present time with no recent chest pain or shortness of breath.  He started working again.  Past Medical History:  Diagnosis Date  . Anemia 10/03/2009   Qualifier: Diagnosis of  By: [name]   . Anxiety associated with depression 10/11/2013  . Colon cancer ([place])    right colon cancer-  adenocarcinoma CEA level isnrmal at 1.8  . Diabetes mellitus   . Diabetes mellitus type 2 in obese [facility]) 03/20/2010   Qualifier: Diagnosis of  By: [name]    . Erectile dysfunction 06/22/2016  . Essential hypertension 06/05/2014  . FATTY LIVER DISEASE 11/04/2009   Qualifier: Diagnosis of  By: [name] MD, Docia Chuck   Qualifier: Diagnosis of  By: [name] MD, Docia Chuck  Last Assessment & Plan:  conirmed by CT scan of abdomen in April 2016 encouraged to minimize simple carbs and fatty foods.  Marland Kitchen GERD 11/04/2009   Qualifier: Diagnosis of  By: [name] MD, Docia Chuck   . Great toe pain, right 04/18/2017  . Hepatic artery stenosis ([place])   . History of colon cancer 01/02/2010   Qualifier: Diagnosis of  By: [name] Dr [name]  Last Assessment & Plan:  Follows closely with gastroenterology, Dr [name]  . Hyperlipidemia   . Hyperlipidemia, mixed 09/07/2010   Qualifier: Diagnosis of  By: [name]   . Hypertension   . Hypertriglyceridemia 12/14/2010  . Hypotestosteronism 06/22/2016  . INSOMNIA, CHRONIC 03/20/2010   Qualifier: Diagnosis of  By: [name] Ambien 10 mg daily does not keep asleep AdvilPM is over sedating and has trouble waking up   . Iron deficiency anemia 2011  . Low testosterone 06/22/2016  . Malignant neoplasm of colon ([place]) 01/02/2010   Qualifier: Diagnosis of  By: [name] Dr [name]   .  Muscle spasm of back 06/05/2014  . Nipple pain 09/30/2016  . Obesity   . Palpitations 06/05/2014  . Preventative health care 10/08/2012  . Psoriasis   . Psoriatic arthritis ([place]) 08/25/2017  . Right knee pain 11/17/2011    Past Surgical History:  Procedure Laterality Date  . CARPAL TUNNEL RELEASE     RIGHT  . CHOLECYSTECTOMY  1994  . COLONOSCOPY    . CORONARY/GRAFT ACUTE MI REVASCULARIZATION N/A 10/27/2019   Procedure: Coronary/Graft Acute MI Revascularization;  Surgeon: [name], MD;  Location: [facility];  Service: Cardiovascular;  Laterality: N/A;  .  HEMICOLECTOMY     12/17/2009 right  . LEFT HEART CATH AND CORONARY ANGIOGRAPHY N/A 10/27/2019   Procedure: LEFT HEART CATH AND CORONARY ANGIOGRAPHY;  Surgeon: [name], MD;  Location: [facility];  Service: Cardiovascular;  Laterality: N/A;  . POLYPECTOMY       Current Outpatient Medications  Medication Sig Dispense Refill  . aspirin 81 MG chewable tablet Chew 1 tablet (81 mg total) by mouth daily. 30 tablet 1  . Blood Glucose Monitoring Suppl (ONETOUCH VERIO) W/DEVICE KIT   0  . celecoxib (CELEBREX) 200 MG capsule Take 1 capsule (200 mg total) by mouth 2 (two) times daily as needed. 60 capsule 3  . clobetasol cream (TEMOVATE) 0.05 %     . diazepam (VALIUM) 5 MG tablet Take 1 tablet (5 mg total) by mouth daily as needed for anxiety. 30 tablet 2  . folic acid (FOLVITE) 1 MG tablet Take 1 mg by mouth daily.     Marland Kitchen glucose blood (ONETOUCH VERIO) test strip Test three times daily, DX E11.9 300 each 6  . HYDROcodone-acetaminophen (NORCO) 5-325 MG tablet Take 1 tablet by mouth every 6 (six) hours as needed for moderate pain or severe pain. 30 tablet 0  . insulin aspart (NOVOLOG FLEXPEN) 100 UNIT/ML FlexPen Inject 15 Units into the skin 3 (three) times daily with meals. 15 mL 5  . Insulin Glargine (BASAGLAR KWIKPEN) 100 UNIT/ML Inject 0.6 mLs (60 Units total) into the skin at bedtime. 15 pen 3  . Insulin Pen Needle (B-D ULTRAFINE III SHORT PEN) 31G X 8 MM MISC Test as directed three times daily.  DX E11.9 300 each 3  . losartan (COZAAR) 25 MG tablet Take 1 tablet (25 mg total) by mouth daily. 90 tablet 2  . methotrexate (RHEUMATREX) 2.5 MG tablet as needed.     . metoprolol succinate (TOPROL XL) 25 MG 24 hr tablet Take 1 tablet (25 mg total) by mouth daily. 90 tablet 3  . [place] LANCETS MISC     . predniSONE (DELTASONE) 5 MG tablet Take 1 tablet by mouth daily as needed.     . rosuvastatin (CRESTOR) 10 MG tablet Take 1 tablet (10 mg total) by mouth daily. 90 tablet 1  .  ticagrelor (BRILINTA) 90 MG TABS tablet Take 1 tablet (90 mg total) by mouth 2 (two) times daily. 60 tablet 2  . zolpidem (AMBIEN) 10 MG tablet TAKE 1 TABLET(10 MG) BY MOUTH AT BEDTIME AS NEEDED 15 tablet 5   No current facility-administered medications for this visit.    Allergies:   Patient has no known allergies.    Social History:  The patient  reports that he has been smoking cigarettes. He has never used smokeless tobacco. He reports that he does not drink alcohol and does not use drugs.   Family History:  The patient's family history includes Alcohol  abuse in his brother; Diabetes in his brother; Diabetes type II in his brother; Stomach cancer in his mother.    ROS:  Please see the history of present illness.   Otherwise, review of systems are positive for none.   All other systems are reviewed and negative.    PHYSICAL EXAM: VS:  BP (!) 130/68 (BP Location: Left Arm, Patient Position: Sitting, Cuff Size: Normal)   Pulse 90   Ht _0  (1.727 m)   Wt (!) 235 lb 6 oz (106.8 kg)   SpO2 98%   BMI 35.79 kg/m  , BMI Body mass index is 35.79 kg/m. GEN: Well nourished, well developed, in no acute distress  HEENT: normal  Neck: no JVD, carotid bruits, or masses Cardiac: RRR; no murmurs, rubs, or gallops,no edema  Respiratory:  clear to auscultation bilaterally, normal work of breathing GI: soft, nontender, nondistended, + BS MS: no deformity or atrophy  Skin: warm and dry, no rash Neuro:  Strength and sensation are intact Psych: euthymic mood, full affect    EKG:  EKG is ordered today. The ekg ordered today demonstrates normal sinus rhythm with old anteroseptal infarct.   Recent Labs: 10/29/2019: Magnesium 2.0 01/31/2020: ALT 21; BUN 18; Creatinine, Ser 0.66; Hemoglobin 13.4; Platelets 297.0; Potassium 3.8; Sodium 138; TSH 3.38    Lipid Panel    Component Value Date/Time   CHOL 149 01/31/2020 1043   CHOL 949 (H) 10/27/2014 1322   TRIG 225.0 (H) 01/31/2020 1043   TRIG  1,098 (H) 10/30/2014 0605   HDL 29.70 (L) 01/31/2020 1043   HDL 20 (L) 10/27/2014 1322   CHOLHDL 5 01/31/2020 1043   VLDL 45.0 (H) 01/31/2020 1043   VLDL SEE COMMENT 10/27/2014 1322   LDLCALC UNABLE TO CALCULATE IF TRIGLYCERIDE OVER 400 mg/dL 10/27/2019 1406   LDLCALC SEE COMMENT 10/27/2014 1322   LDLDIRECT 71.0 01/31/2020 1043      Wt Readings from Last 3 Encounters:  02/14/20 (!) 235 lb 6 oz (106.8 kg)  01/31/20 233 lb 9.6 oz (106 kg)  11/20/19 223 lb 2 oz (101.2 kg)        PAD Screen 12/07/2017  Previous PAD dx? No  Previous surgical procedure? No  Pain with walking? No  Feet/toe relief with dangling? Yes  Painful, non-healing ulcers? No  Extremities discolored? No      ASSESSMENT AND PLAN:  1.  Coronary artery disease involving native coronary arteries without angina: He is doing well at the present time.  I again had a prolonged discussion with him about the importance of dual antiplatelet therapy.  I explained to him that antiplatelet medications are unlikely to be aggravating his arthritis discomfort.  Is currently on low-dose aspirin and Brilinta with no issues.  2.  Chronic systolic heart failure due to ischemic cardiomyopathy: No evidence of significant volume overload.  Ejection fraction was 35 to 40%.  Continue treatment with small dose Toprol and losartan.  I increase losartan to 25 mg once daily.  3.  Essential hypertension: Blood pressure is controlled.  4.  Hyperlipidemia: He had myalgia with atorvastatin but doing well on rosuvastatin.  Most recent lipid profile showed an LDL of 71.  His triglyceride was 225 but that is an improvement from previous triglyceride of greater than 1000.  We can consider adding Vascepa in the future.   Disposition:   FU with me in 6 months.  Signed,  Kathlyn Sacramento, MD  02/14/2020 10:52 AM    [place]

## 2020-02-27 ENCOUNTER — Encounter: Payer: Self-pay | Admitting: Family Medicine

## 2020-02-27 ENCOUNTER — Other Ambulatory Visit: Payer: Self-pay | Admitting: Family Medicine

## 2020-02-27 MED ORDER — SULFAMETHOXAZOLE-TRIMETHOPRIM 800-160 MG PO TABS: 1.0000 | ORAL_TABLET | Freq: Two times a day (BID) | ORAL | 0 refills | Status: DC

## 2020-02-27 MED ORDER — DICLOFENAC SODIUM 75 MG PO TBEC
75.0000 mg | DELAYED_RELEASE_TABLET | Freq: Two times a day (BID) | ORAL | 1 refills | Status: DC | PRN
Start: 2020-02-27 — End: 2020-04-25

## 2020-02-27 NOTE — Progress Notes (Unsigned)
bactrm

## 2020-04-25 ENCOUNTER — Other Ambulatory Visit: Payer: Self-pay | Admitting: Cardiovascular Disease

## 2020-04-25 ENCOUNTER — Other Ambulatory Visit: Payer: Self-pay | Admitting: Family Medicine

## 2020-05-05 ENCOUNTER — Other Ambulatory Visit: Payer: Commercial Managed Care - PPO

## 2020-05-14 ENCOUNTER — Encounter: Payer: Self-pay | Admitting: Family Medicine

## 2020-05-15 ENCOUNTER — Other Ambulatory Visit: Payer: Self-pay | Admitting: Family Medicine

## 2020-05-15 MED ORDER — INSULIN LISPRO (1 UNIT DIAL) 100 UNIT/ML (KWIKPEN): 15.0000 [IU] | PEN_INJECTOR | Freq: Three times a day (TID) | SUBCUTANEOUS | 3 refills | Status: DC

## 2020-05-15 MED ORDER — INSULIN GLARGINE 100 UNITS/ML SOLOSTAR PEN: 60.0000 [IU] | PEN_INJECTOR | Freq: Every day | SUBCUTANEOUS | 3 refills | Status: DC

## 2020-05-15 MED ORDER — INSULIN LISPRO (1 UNIT DIAL) 100 UNIT/ML (KWIKPEN)
15.0000 [IU] | PEN_INJECTOR | Freq: Three times a day (TID) | SUBCUTANEOUS | 3 refills | Status: DC
Start: 2020-05-15 — End: 2020-09-23

## 2020-05-28 NOTE — Telephone Encounter (Signed)
Opened in error

## 2020-06-02 ENCOUNTER — Telehealth: Payer: Self-pay

## 2020-06-02 NOTE — Telephone Encounter (Signed)
Patient called this morning wanting to know if he could move his appointment up with Dr. Charlett Blake.  I looked at provider's schedule and told patient he is scheduled for a CPE in January and Dr. Charlett Blake is booked up and January will be the next available month for a CPE.  He told me he found that hard to believe.  I advised patient he could be seen by her PA and he refused and said that would never happen again. It would be Erline Levine or nothing.  He said even she knows that and advised against him seeing the PA since he is complicated.  Patient was not happy with my answer of a CPE in January, but I offered the PA, which is what we do for all of Dr. Frederik Pear patients.  He, in the end, kept the appointment in January.

## 2020-06-03 MED ORDER — METOPROLOL SUCCINATE ER 25 MG PO TB24
25.0000 mg | ORAL_TABLET | Freq: Every day | ORAL | 1 refills | Status: DC
Start: 2020-06-03 — End: 2020-08-19

## 2020-06-03 MED ORDER — TICAGRELOR 90 MG PO TABS: ORAL_TABLET | ORAL | 1 refills | Status: DC

## 2020-06-03 NOTE — Telephone Encounter (Signed)
Thanks, I could do an urgent visit sooner if he is having an acute concern.

## 2020-06-09 ENCOUNTER — Encounter: Payer: Self-pay | Admitting: Family Medicine

## 2020-06-10 MED ORDER — ONETOUCH VERIO VI STRP: ORAL_STRIP | 12 refills | Status: DC

## 2020-06-11 MED ORDER — MICROLET LANCETS MISC: 1 refills | Status: DC

## 2020-06-11 MED ORDER — CONTOUR NEXT MONITOR W/DEVICE KIT: PACK | 0 refills | Status: DC

## 2020-06-11 MED ORDER — CONTOUR NEXT TEST VI STRP: ORAL_STRIP | 1 refills | Status: DC

## 2020-06-11 NOTE — Telephone Encounter (Signed)
Left message on machine the rxs have been sent in.

## 2020-06-11 NOTE — Telephone Encounter (Signed)
Patient states insurance will not cover current test strip one touch glucose test strips.   Patient states he need a new glucose meter and test strips ordered.

## 2020-06-11 NOTE — Telephone Encounter (Signed)
Patient called stating he received a call from this office.  I read the telephone encounter documentation to the patient advising scripts were sent in to his pharmacy.

## 2020-06-11 NOTE — Telephone Encounter (Signed)
Patient states that Contour Next is the meter that his insurance will cover.  Patient needs test strips and  contour next strips lancet miroclet color  Patient is completely out and needs sent asap to [facility], [place] [address] [address]: [facility] corner of [facility] Phone # [phone]

## 2020-08-11 ENCOUNTER — Other Ambulatory Visit: Payer: Commercial Managed Care - PPO

## 2020-08-15 ENCOUNTER — Other Ambulatory Visit: Payer: Self-pay

## 2020-08-18 ENCOUNTER — Other Ambulatory Visit: Payer: Commercial Managed Care - PPO

## 2020-08-18 ENCOUNTER — Encounter: Payer: Self-pay | Admitting: Family Medicine

## 2020-08-18 ENCOUNTER — Other Ambulatory Visit: Payer: Self-pay

## 2020-08-18 ENCOUNTER — Ambulatory Visit (INDEPENDENT_AMBULATORY_CARE_PROVIDER_SITE_OTHER): Payer: Commercial Managed Care - PPO | Admitting: Family Medicine

## 2020-08-18 VITALS — BP 130/72 | HR 97 | Temp 97.9°F | Resp 16 | Ht 68.0 in | Wt 247.8 lb

## 2020-08-18 DIAGNOSIS — E669 Obesity, unspecified: Secondary | ICD-10-CM | POA: Diagnosis not present

## 2020-08-18 DIAGNOSIS — G4700 Insomnia, unspecified: Secondary | ICD-10-CM

## 2020-08-18 DIAGNOSIS — F419 Anxiety disorder, unspecified: Secondary | ICD-10-CM

## 2020-08-18 DIAGNOSIS — E1169 Type 2 diabetes mellitus with other specified complication: Secondary | ICD-10-CM

## 2020-08-18 DIAGNOSIS — M79601 Pain in right arm: Secondary | ICD-10-CM

## 2020-08-18 DIAGNOSIS — R351 Nocturia: Secondary | ICD-10-CM | POA: Insufficient documentation

## 2020-08-18 DIAGNOSIS — E782 Mixed hyperlipidemia: Secondary | ICD-10-CM

## 2020-08-18 DIAGNOSIS — Z79899 Other long term (current) drug therapy: Secondary | ICD-10-CM

## 2020-08-18 DIAGNOSIS — M7989 Other specified soft tissue disorders: Secondary | ICD-10-CM

## 2020-08-18 DIAGNOSIS — I10 Essential (primary) hypertension: Secondary | ICD-10-CM

## 2020-08-18 DIAGNOSIS — F418 Other specified anxiety disorders: Secondary | ICD-10-CM

## 2020-08-18 DIAGNOSIS — Z0000 Encounter for general adult medical examination without abnormal findings: Secondary | ICD-10-CM | POA: Diagnosis not present

## 2020-08-18 DIAGNOSIS — L4050 Arthropathic psoriasis, unspecified: Secondary | ICD-10-CM

## 2020-08-18 LAB — PSA: PSA: 0.19 ng/mL (ref 0.10–4.00)

## 2020-08-18 LAB — COMPREHENSIVE METABOLIC PANEL
ALT: 43 U/L (ref 0–53)
AST: 24 U/L (ref 0–37)
Albumin: 3.9 g/dL (ref 3.5–5.2)
Alkaline Phosphatase: 61 U/L (ref 39–117)
BUN: 14 mg/dL (ref 6–23)
CO2: 24 mEq/L (ref 19–32)
Calcium: 8.9 mg/dL (ref 8.4–10.5)
Chloride: 102 mEq/L (ref 96–112)
Creatinine, Ser: 0.74 mg/dL (ref 0.40–1.50)
GFR: 100.38 mL/min (ref >60.00)
Glucose, Bld: 173 mg/dL — ABNORMAL HIGH (ref 70–99)
Potassium: 3.7 mEq/L (ref 3.5–5.1)
Sodium: 136 mEq/L (ref 135–145)
Total Bilirubin: 0.9 mg/dL (ref 0.2–1.2)
Total Protein: 6.7 g/dL (ref 6.0–8.3)

## 2020-08-18 LAB — CBC WITH DIFFERENTIAL/PLATELET
Basophils Absolute: 0.1 10*3/uL (ref 0.0–0.1)
Basophils Relative: 1 % (ref 0.0–3.0)
Eosinophils Absolute: 0.3 10*3/uL (ref 0.0–0.7)
Eosinophils Relative: 3.2 % (ref 0.0–5.0)
HCT: 39.9 % (ref 39.0–52.0)
Hemoglobin: 13.8 g/dL (ref 13.0–17.0)
Lymphocytes Relative: 26.4 % (ref 12.0–46.0)
Lymphs Abs: 2.7 10*3/uL (ref 0.7–4.0)
MCHC: 34.5 g/dL (ref 30.0–36.0)
MCV: 94.6 fl (ref 78.0–100.0)
Monocytes Absolute: 0.8 10*3/uL (ref 0.1–1.0)
Monocytes Relative: 8.4 % (ref 3.0–12.0)
Neutro Abs: 6.2 10*3/uL (ref 1.4–7.7)
Neutrophils Relative %: 61 % (ref 43.0–77.0)
Platelets: 274 10*3/uL (ref 150.0–400.0)
RBC: 4.22 Mil/uL (ref 4.22–5.81)
RDW: 15.1 % (ref 11.5–15.5)
WBC: 10.1 10*3/uL (ref 4.0–10.5)

## 2020-08-18 LAB — HEMOGLOBIN A1C: Hgb A1c MFr Bld: 9.6 % — ABNORMAL HIGH (ref 4.6–6.5)

## 2020-08-18 LAB — LIPID PANEL
Cholesterol: 439 mg/dL — ABNORMAL HIGH (ref 0–200)
HDL: 31 mg/dL — ABNORMAL LOW (ref >39.00)
Total CHOL/HDL Ratio: 14
Triglycerides: 1027 mg/dL — ABNORMAL HIGH (ref 0.0–149.0)

## 2020-08-18 LAB — TSH: TSH: 2.21 u[IU]/mL (ref 0.35–4.50)

## 2020-08-18 LAB — LDL CHOLESTEROL, DIRECT: Direct LDL: 64 mg/dL

## 2020-08-18 MED ORDER — HYDROCODONE-ACETAMINOPHEN 5-325 MG PO TABS: 1.0000 | ORAL_TABLET | Freq: Four times a day (QID) | ORAL | 0 refills | Status: DC | PRN

## 2020-08-18 MED ORDER — DIAZEPAM 5 MG PO TABS: 5.0000 mg | ORAL_TABLET | Freq: Every day | ORAL | 2 refills | Status: DC | PRN

## 2020-08-18 MED ORDER — ZOLPIDEM TARTRATE 10 MG PO TABS
ORAL_TABLET | ORAL | 5 refills | Status: DC
Start: 2020-08-18 — End: 2020-10-28

## 2020-08-18 NOTE — Progress Notes (Unsigned)
Subjective:    Patient ID: [name], male    DOB: 11-Jul-1961, 60 y.o.   MRN: [id number]  Chief Complaint  Patient presents with  . Annual Exam    HPI Patient is in today for annual preventative exam and follow up on chronic medical concerns. No recent febrile illness or hospitalizations. He is in a new job now and working way to many hours. He acknowledges he is very tired and frustrated. He has been not taking his meds regularly and his sugars are running high as a result but he does not really check them. He goes out for fast food on a regular basis. He is not exercising either. Denies CP/palp/SOB/HA/congestion/fevers/GI or GU c/o. Taking meds as prescribed  Past Medical History:  Diagnosis Date  . Anemia 10/03/2009   Qualifier: Diagnosis of  By: [name]   . Anxiety associated with depression 10/11/2013  . Colon cancer ([place])    right colon cancer- adenocarcinoma CEA level isnrmal at 1.8  . Diabetes mellitus   . Diabetes mellitus type 2 in obese [facility]) 03/20/2010   Qualifier: Diagnosis of  By: [name]    . Erectile dysfunction 06/22/2016  . Essential hypertension 06/05/2014  . FATTY LIVER DISEASE 11/04/2009   Qualifier: Diagnosis of  By: [name] MD, Docia Chuck   Qualifier: Diagnosis of  By: [name] MD, Docia Chuck  Last Assessment & Plan:  conirmed by CT scan of abdomen in April 2016 encouraged to minimize simple carbs and fatty foods.  Marland Kitchen GERD 11/04/2009   Qualifier: Diagnosis of  By: [name] MD, Docia Chuck   . Great toe pain, right 04/18/2017  . Hepatic artery stenosis ([place])   . History of colon cancer 01/02/2010   Qualifier: Diagnosis of  By: [name] Dr [name]  Last Assessment & Plan:  Follows closely with gastroenterology, Dr [name]  . Hyperlipidemia   . Hyperlipidemia, mixed 09/07/2010   Qualifier: Diagnosis of  By: [name]   . Hypertension   . Hypertriglyceridemia 12/14/2010  . Hypotestosteronism 06/22/2016  . INSOMNIA, CHRONIC 03/20/2010   Qualifier:  Diagnosis of  By: [name] Ambien 10 mg daily does not keep asleep AdvilPM is over sedating and has trouble waking up   . Iron deficiency anemia 2011  . Low testosterone 06/22/2016  . Malignant neoplasm of colon ([place]) 01/02/2010   Qualifier: Diagnosis of  By: [name] Dr [name]   . Muscle spasm of back 06/05/2014  . Nipple pain 09/30/2016  . Obesity   . Palpitations 06/05/2014  . Preventative health care 10/08/2012  . Psoriasis   . Psoriatic arthritis ([place]) 08/25/2017  . Right knee pain 11/17/2011    Past Surgical History:  Procedure Laterality Date  . CARPAL TUNNEL RELEASE     RIGHT  . CHOLECYSTECTOMY  1994  . COLONOSCOPY    . CORONARY/GRAFT ACUTE MI REVASCULARIZATION N/A 10/27/2019   Procedure: Coronary/Graft Acute MI Revascularization;  Surgeon: [name], MD;  Location: [place] [facility];  Service: Cardiovascular;  Laterality: N/A;  . HEMICOLECTOMY     12/17/2009 right  . LEFT HEART CATH AND CORONARY ANGIOGRAPHY N/A 10/27/2019   Procedure: LEFT HEART CATH AND CORONARY ANGIOGRAPHY;  Surgeon: [name], MD;  Location: [place];  Service: Cardiovascular;  Laterality: N/A;  . POLYPECTOMY      Family History  Problem Relation Age of Onset  . Stomach cancer Mother  diedinher 60's  . Diabetes type II Brother        boderline  . Diabetes Brother        type II  . Alcohol abuse Brother   . Other Neg Hx        cad,prostate ca, colon ca  . Coronary artery disease Neg Hx   . Cancer Neg Hx        colon, prostate  . Colon cancer Neg Hx   . Esophageal cancer Neg Hx   . Rectal cancer Neg Hx     Social History   Socioeconomic History  . Marital status: Single    Spouse name: [name]  . Number of children: 0  . Years of education: Not on file  . Highest education level: Not on file  Occupational History  . Occupation: Merchant navy officer    Comment: Recruitment consultant  Tobacco Use  . Smoking status: Former Smoker    Types: Cigarettes     Quit date: 10/27/2019    Years since quitting: 0.8  . Smokeless tobacco: Never Used  Vaping Use  . Vaping Use: Never used  Substance and Sexual Activity  . Alcohol use: No  . Drug use: No  . Sexual activity: Yes    Comment: lives with girlfriend, no dietary restrictions  Other Topics Concern  . Not on file  Social History Narrative   Occupation: Merchant navy officer (Recruitment consultant)   Single  (lives with [name])   no children    former smoker   Illicit Drug Use - no    Social Determinants of Radio broadcast assistant Strain: Not on file  Food Insecurity: Not on file  Transportation Needs: Not on file  Physical Activity: Not on file  Stress: Not on file  Social Connections: Not on file  Intimate Partner Violence: Not on file    Outpatient Medications Prior to Visit  Medication Sig Dispense Refill  . Blood Glucose Monitoring Suppl (CONTOUR NEXT MONITOR) w/Device KIT Use to check blood sugar tid.  Dx code: E11.9 1 kit 0  . clobetasol cream (TEMOVATE) 0.05 %     . diclofenac (VOLTAREN) 75 MG EC tablet TAKE 1 TABLET (75 MG TOTAL) BY MOUTH 2 (TWO) TIMES DAILY AS NEEDED. 60 tablet 1  . folic acid (FOLVITE) 1 MG tablet Take 1 mg by mouth daily.     Marland Kitchen glucose blood (CONTOUR NEXT TEST) test strip Use to check blood sugar tid.  Dx code: E11.9 300 each 1  . insulin glargine (LANTUS) 100 unit/mL SOPN Inject 60 Units into the skin daily. 15 mL 3  . insulin lispro (HUMALOG KWIKPEN) 100 UNIT/ML KwikPen Inject 15 Units into the skin 3 (three) times daily. 15 mL 3  . Insulin Pen Needle (B-D ULTRAFINE III SHORT PEN) 31G X 8 MM MISC Test as directed three times daily.  DX E11.9 300 each 3  . methotrexate (RHEUMATREX) 2.5 MG tablet as needed.     . Microlet Lancets MISC Use to check blood sugar tid.  Dx code: E11.9 300 each 1  . predniSONE (DELTASONE) 5 MG tablet Take 1 tablet by mouth daily as needed.     . sulfamethoxazole-trimethoprim (BACTRIM DS) 800-160 MG tablet Take 1 tablet by mouth 2 (two)  times daily. 20 tablet 0  . ticagrelor (BRILINTA) 90 MG TABS tablet TAKE 1 TABLET (90 MG TOTAL) BY MOUTH 2 (TWO) TIMES DAILY. 90 tablet 1  . aspirin 81 MG chewable tablet Chew 1 tablet (81 mg total)  by mouth daily. 30 tablet 1  . diazepam (VALIUM) 5 MG tablet Take 1 tablet (5 mg total) by mouth daily as needed for anxiety. 30 tablet 2  . HYDROcodone-acetaminophen (NORCO) 5-325 MG tablet Take 1 tablet by mouth every 6 (six) hours as needed for moderate pain or severe pain. 30 tablet 0  . losartan (COZAAR) 25 MG tablet Take 1 tablet (25 mg total) by mouth daily. 90 tablet 2  . metoprolol succinate (TOPROL XL) 25 MG 24 hr tablet Take 1 tablet (25 mg total) by mouth daily. 90 tablet 1  . zolpidem (AMBIEN) 10 MG tablet TAKE 1 TABLET(10 MG) BY MOUTH AT BEDTIME AS NEEDED 15 tablet 5  . rosuvastatin (CRESTOR) 10 MG tablet Take 1 tablet (10 mg total) by mouth daily. 90 tablet 1   No facility-administered medications prior to visit.    No Known Allergies  Review of Systems  Constitutional: Negative for chills, fever and malaise/fatigue.  HENT: Negative for congestion and hearing loss.   Eyes: Negative for discharge.  Respiratory: Negative for cough, sputum production and shortness of breath.   Cardiovascular: Negative for chest pain, palpitations and leg swelling.  Gastrointestinal: Negative for abdominal pain, blood in stool, constipation, diarrhea, heartburn, nausea and vomiting.  Genitourinary: Negative for dysuria, frequency, hematuria and urgency.  Musculoskeletal: Negative for back pain, falls and myalgias.  Skin: Negative for rash.  Neurological: Negative for dizziness, sensory change, loss of consciousness, weakness and headaches.  Endo/Heme/Allergies: Negative for environmental allergies. Does not bruise/bleed easily.  Psychiatric/Behavioral: Negative for depression and suicidal ideas. The patient is not nervous/anxious and does not have insomnia.        Objective:    Physical  Exam Vitals and nursing note reviewed.  Constitutional:      General: He is not in acute distress.    Appearance: He is well-developed and well-nourished.  HENT:     Head: Normocephalic and atraumatic.     Right Ear: Tympanic membrane, ear canal and external ear normal. There is no impacted cerumen.     Left Ear: Tympanic membrane, ear canal and external ear normal.     Nose: Nose normal. No congestion.  Eyes:     General:        Right eye: No discharge.        Left eye: No discharge.     Extraocular Movements: Extraocular movements intact.     Pupils: Pupils are equal, round, and reactive to light.  Cardiovascular:     Rate and Rhythm: Normal rate and regular rhythm.     Heart sounds: No murmur heard.   Pulmonary:     Effort: Pulmonary effort is normal.     Breath sounds: Normal breath sounds.  Abdominal:     General: Bowel sounds are normal.     Palpations: Abdomen is soft.     Tenderness: There is no abdominal tenderness.  Musculoskeletal:        General: No edema.     Cervical back: Normal range of motion and neck supple.  Skin:    General: Skin is warm and dry.  Neurological:     Mental Status: He is alert and oriented to person, place, and time.  Psychiatric:        Mood and Affect: Mood and affect normal.     BP 130/72   Pulse 97   Temp 97.9 F (36.6 C)   Resp 16   Ht '5\' 8"'  (1.727 m)   Wt 247 lb 12.8  oz (112.4 kg)   SpO2 99%   BMI 37.68 kg/m  Wt Readings from Last 3 Encounters:  08/19/20 250 lb 8 oz (113.6 kg)  08/18/20 247 lb 12.8 oz (112.4 kg)  02/14/20 (!) 235 lb 6 oz (106.8 kg)    Diabetic Foot Exam - Simple   No data filed    Lab Results  Component Value Date   WBC 10.1 08/18/2020   HGB 13.8 08/18/2020   HCT 39.9 08/18/2020   PLT 274.0 08/18/2020   GLUCOSE 173 (H) 08/18/2020   CHOL 439 (H) 08/18/2020   TRIG (H) 08/18/2020    1027.0 Triglyceride is over 400; calculations on Lipids are invalid.   HDL 31.00 (L) 08/18/2020   LDLDIRECT  64.0 08/18/2020   LDLCALC UNABLE TO CALCULATE IF TRIGLYCERIDE OVER 400 mg/dL 10/27/2019   ALT 43 08/18/2020   AST 24 08/18/2020   NA 136 08/18/2020   K 3.7 08/18/2020   CL 102 08/18/2020   CREATININE 0.74 08/18/2020   BUN 14 08/18/2020   CO2 24 08/18/2020   TSH 2.21 08/18/2020   PSA 0.19 08/18/2020   INR 1.0 10/27/2019   HGBA1C 9.6 (H) 08/18/2020   MICROALBUR 0.50 01/08/2013    Lab Results  Component Value Date   TSH 2.21 08/18/2020   Lab Results  Component Value Date   WBC 10.1 08/18/2020   HGB 13.8 08/18/2020   HCT 39.9 08/18/2020   MCV 94.6 08/18/2020   PLT 274.0 08/18/2020   Lab Results  Component Value Date   NA 136 08/18/2020   K 3.7 08/18/2020   CO2 24 08/18/2020   GLUCOSE 173 (H) 08/18/2020   BUN 14 08/18/2020   CREATININE 0.74 08/18/2020   BILITOT 0.9 08/18/2020   ALKPHOS 61 08/18/2020   AST 24 08/18/2020   ALT 43 08/18/2020   PROT 6.7 08/18/2020   ALBUMIN 3.9 08/18/2020   CALCIUM 8.9 08/18/2020   ANIONGAP 8 11/05/2019   GFR 100.38 08/18/2020   Lab Results  Component Value Date   CHOL 439 (H) 08/18/2020   Lab Results  Component Value Date   HDL 31.00 (L) 08/18/2020   Lab Results  Component Value Date   LDLCALC UNABLE TO CALCULATE IF TRIGLYCERIDE OVER 400 mg/dL 10/27/2019   Lab Results  Component Value Date   TRIG (H) 08/18/2020    1027.0 Triglyceride is over 400; calculations on Lipids are invalid.   Lab Results  Component Value Date   CHOLHDL 14 08/18/2020   Lab Results  Component Value Date   HGBA1C 9.6 (H) 08/18/2020       Assessment & Plan:   Problem List Items Addressed This Visit    Obesity - Primary    Encouraged DASH diet, decrease po intake and increase exercise as tolerated. Needs 7-8 hours of sleep nightly. Avoid trans fats, eat small, frequent meals every 4-5 hours with lean proteins, complex carbs and healthy fats. Minimize simple carbs      INSOMNIA, CHRONIC    Encouraged good sleep hygiene such as dark, quiet  room. No blue/green glowing lights such as computer screens in bedroom. No alcohol or stimulants in evening. Cut down on caffeine as able. Regular exercise is helpful but not just prior to bed time. Refill given on Ambien      Diabetes mellitus type 2 in obese ([place])    hgba1c unacceptable but has not been taking his insulin, minimize simple carbs. Increase exercise as tolerated. Continue current meds      Relevant Orders  Hemoglobin A1c (Completed)   CBC with Differential/Platelet (Completed)   Comprehensive metabolic panel (Completed)   Lipid panel (Completed)   TSH (Completed)   PSA (Completed)   Hyperlipidemia, mixed    Encouraged heart healthy diet, increase exercise, avoid trans fats, consider a krill oil cap daily. Encouraged Atorvastatin.       Relevant Orders   CBC with Differential/Platelet (Completed)   Lipid panel (Completed)   Preventative health care    Patient encouraged to maintain heart healthy diet, regular exercise, adequate sleep. Consider daily probiotics. Take medications as prescribed. Labs ordered and reviewed      Depression with anxiety   Relevant Medications   diazepam (VALIUM) 5 MG tablet   Essential hypertension    Well controlled, no changes to meds. Encouraged heart healthy diet such as the DASH diet and exercise as tolerated.       Relevant Orders   CBC with Differential/Platelet (Completed)   Comprehensive metabolic panel (Completed)   TSH (Completed)   Psoriatic arthritis ([place])    Doing much better on current, follows with specialty      Relevant Medications   HYDROcodone-acetaminophen (NORCO) 5-325 MG tablet   Nocturia    PSA is checked today       Other Visit Diagnoses    High risk medication use       Relevant Orders   DRUG MONITORING, PANEL 8 WITH CONFIRMATION, URINE   Anxiety       Relevant Medications   diazepam (VALIUM) 5 MG tablet   Other Relevant Orders   DRUG MONITORING, PANEL 8 WITH CONFIRMATION, URINE   Pain and  swelling of right upper extremity       Relevant Medications   HYDROcodone-acetaminophen (NORCO) 5-325 MG tablet      I am having [name] maintain his clobetasol cream, Insulin Pen Needle, predniSONE, methotrexate, folic acid, sulfamethoxazole-trimethoprim, diclofenac, insulin glargine, insulin lispro, ticagrelor, Contour Next Monitor, Contour Next Test, Microlet Lancets, zolpidem, diazepam, and HYDROcodone-acetaminophen.  Meds ordered this encounter  Medications  . zolpidem (AMBIEN) 10 MG tablet    Sig: TAKE 1 TABLET(10 MG) BY MOUTH AT BEDTIME AS NEEDED    Dispense:  15 tablet    Refill:  5  . diazepam (VALIUM) 5 MG tablet    Sig: Take 1 tablet (5 mg total) by mouth daily as needed for anxiety.    Dispense:  30 tablet    Refill:  2  . HYDROcodone-acetaminophen (NORCO) 5-325 MG tablet    Sig: Take 1 tablet by mouth every 6 (six) hours as needed for moderate pain or severe pain.    Dispense:  30 tablet    Refill:  0     Penni Homans, MD

## 2020-08-18 NOTE — Assessment & Plan Note (Signed)
PSA is checked today

## 2020-08-18 NOTE — Patient Instructions (Signed)

## 2020-08-19 ENCOUNTER — Ambulatory Visit (INDEPENDENT_AMBULATORY_CARE_PROVIDER_SITE_OTHER): Payer: Commercial Managed Care - PPO | Admitting: Cardiovascular Disease

## 2020-08-19 ENCOUNTER — Encounter: Payer: Self-pay | Admitting: Cardiovascular Disease

## 2020-08-19 VITALS — BP 142/80 | HR 105 | Ht 69.0 in | Wt 250.5 lb

## 2020-08-19 DIAGNOSIS — I5022 Chronic systolic (congestive) heart failure: Secondary | ICD-10-CM

## 2020-08-19 DIAGNOSIS — E785 Hyperlipidemia, unspecified: Secondary | ICD-10-CM | POA: Diagnosis not present

## 2020-08-19 DIAGNOSIS — I10 Essential (primary) hypertension: Secondary | ICD-10-CM

## 2020-08-19 DIAGNOSIS — I2510 Atherosclerotic heart disease of native coronary artery without angina pectoris: Secondary | ICD-10-CM

## 2020-08-19 MED ORDER — METOPROLOL SUCCINATE ER 25 MG PO TB24: 25.0000 mg | ORAL_TABLET | Freq: Every day | ORAL | 3 refills | Status: DC

## 2020-08-19 MED ORDER — LOSARTAN POTASSIUM 25 MG PO TABS: 25.0000 mg | ORAL_TABLET | Freq: Every day | ORAL | 3 refills | Status: DC

## 2020-08-19 MED ORDER — ASPIRIN EC 81 MG PO TBEC: 81.0000 mg | DELAYED_RELEASE_TABLET | Freq: Every day | ORAL | 3 refills | Status: DC

## 2020-08-19 MED ORDER — ROSUVASTATIN CALCIUM 10 MG PO TABS: 10.0000 mg | ORAL_TABLET | Freq: Every day | ORAL | 3 refills | Status: DC

## 2020-08-19 NOTE — Progress Notes (Signed)
Cardiology Office Note   Date:  08/19/2020   ID:  [name], DOB 23-Aug-1960, MRN [id number]  PCP:  Mosie Lukes, MD  Cardiologist:   Kathlyn Sacramento, MD   Chief Complaint  Patient presents with  . 6 month follow up.     "doing well." Medication reviewed by the patient verbally.       History of Present Illness: [name] is a 60 y.o. male who presents for for follow-up visit regarding coronary artery disease and chronic systolic heart failure due to ischemic cardiomyopathy.  He has known history of diabetes mellitus, essential hypertension, hyperlipidemia and tobacco use. He presented in April of 2021 with anterior ST elevation myocardial infarction.  I proceeded with emergent cardiac catheterization which showed an occluded proximal LAD.  This was treated successfully with PCI and drug-eluting stent placement.  There was residual nonocclusive thrombus in the first diagonal which was treated with Aggrastat infusion for 18 hours.  Echocardiogram showed an EF of 35 to 40%.  He did not tolerate prasugrel and was switched to clopidogrel with subsequent improvement in symptoms  He quit smoking since his cardiac event.  During last visit he was switched from carvedilol to Toprol due to persistent tachycardia and shortness of breath.  He had myalgia with atorvastatin but improved after switching to rosuvastatin.  He temporarily stopped taking Plavix recently as he thought it was aggravating his psoriasis arthritis.  We switched him to Brilinta with no issues.    Unfortunately, compliance continues to be an issue.  He admits to running out of metoprolol, aspirin, losartan and rosuvastatin.  He has not been following a healthy diet and his hemoglobin A1c increased back to 9.5.  Recent lipid profile from yesterday showed triglyceride of over thousand.  He denies chest pain or worsening dyspnea.  Past Medical History:  Diagnosis Date  . Anemia 10/03/2009   Qualifier: Diagnosis of  By:  [name]   . Anxiety associated with depression 10/11/2013  . Colon cancer ([place])    right colon cancer- adenocarcinoma CEA level isnrmal at 1.8  . Diabetes mellitus   . Diabetes mellitus type 2 in obese [facility]) 03/20/2010   Qualifier: Diagnosis of  By: [name]    . Erectile dysfunction 06/22/2016  . Essential hypertension 06/05/2014  . FATTY LIVER DISEASE 11/04/2009   Qualifier: Diagnosis of  By: [name] MD, Docia Chuck   Qualifier: Diagnosis of  By: [name] MD, Docia Chuck  Last Assessment & Plan:  conirmed by CT scan of abdomen in April 2016 encouraged to minimize simple carbs and fatty foods.  Marland Kitchen GERD 11/04/2009   Qualifier: Diagnosis of  By: [name] MD, Docia Chuck   . Great toe pain, right 04/18/2017  . Hepatic artery stenosis ([place])   . History of colon cancer 01/02/2010   Qualifier: Diagnosis of  By: [name] Dr [name]  Last Assessment & Plan:  Follows closely with gastroenterology, Dr [name]  . Hyperlipidemia   . Hyperlipidemia, mixed 09/07/2010   Qualifier: Diagnosis of  By: [name]   . Hypertension   . Hypertriglyceridemia 12/14/2010  . Hypotestosteronism 06/22/2016  . INSOMNIA, CHRONIC 03/20/2010   Qualifier: Diagnosis of  By: [name] Ambien 10 mg daily does not keep asleep AdvilPM is over sedating and has trouble waking up   . Iron deficiency anemia 2011  . Low testosterone 06/22/2016  . Malignant neoplasm of colon ([place]) 01/02/2010   Qualifier:  Diagnosis of  By: [name] Dr [name]   . Muscle spasm of back 06/05/2014  . Nipple pain 09/30/2016  . Obesity   . Palpitations 06/05/2014  . Preventative health care 10/08/2012  . Psoriasis   . Psoriatic arthritis ([place]) 08/25/2017  . Right knee pain 11/17/2011    Past Surgical History:  Procedure Laterality Date  . CARPAL TUNNEL RELEASE     RIGHT  . CHOLECYSTECTOMY  1994  . COLONOSCOPY    . CORONARY/GRAFT ACUTE MI REVASCULARIZATION N/A 10/27/2019   Procedure: Coronary/Graft Acute MI Revascularization;  Surgeon:  [name], MD;  Location: [place];  Service: Cardiovascular;  Laterality: N/A;  . HEMICOLECTOMY     12/17/2009 right  . LEFT HEART CATH AND CORONARY ANGIOGRAPHY N/A 10/27/2019   Procedure: LEFT HEART CATH AND CORONARY ANGIOGRAPHY;  Surgeon: [name], MD;  Location: [place] [facility];  Service: Cardiovascular;  Laterality: N/A;  . POLYPECTOMY       Current Outpatient Medications  Medication Sig Dispense Refill  . aspirin 81 MG chewable tablet Chew 1 tablet (81 mg total) by mouth daily. 30 tablet 1  . Blood Glucose Monitoring Suppl (CONTOUR NEXT MONITOR) w/Device KIT Use to check blood sugar tid.  Dx code: E11.9 1 kit 0  . clobetasol cream (TEMOVATE) 0.05 %     . diazepam (VALIUM) 5 MG tablet Take 1 tablet (5 mg total) by mouth daily as needed for anxiety. 30 tablet 2  . diclofenac (VOLTAREN) 75 MG EC tablet TAKE 1 TABLET (75 MG TOTAL) BY MOUTH 2 (TWO) TIMES DAILY AS NEEDED. 60 tablet 1  . folic acid (FOLVITE) 1 MG tablet Take 1 mg by mouth daily.     Marland Kitchen glucose blood (CONTOUR NEXT TEST) test strip Use to check blood sugar tid.  Dx code: E11.9 300 each 1  . HYDROcodone-acetaminophen (NORCO) 5-325 MG tablet Take 1 tablet by mouth every 6 (six) hours as needed for moderate pain or severe pain. 30 tablet 0  . insulin glargine (LANTUS) 100 unit/mL SOPN Inject 60 Units into the skin daily. 15 mL 3  . insulin lispro (HUMALOG KWIKPEN) 100 UNIT/ML KwikPen Inject 15 Units into the skin 3 (three) times daily. 15 mL 3  . Insulin Pen Needle (B-D ULTRAFINE III SHORT PEN) 31G X 8 MM MISC Test as directed three times daily.  DX E11.9 300 each 3  . losartan (COZAAR) 25 MG tablet Take 1 tablet (25 mg total) by mouth daily. 90 tablet 2  . methotrexate (RHEUMATREX) 2.5 MG tablet as needed.     . metoprolol succinate (TOPROL XL) 25 MG 24 hr tablet Take 1 tablet (25 mg total) by mouth daily. 90 tablet 1  . Microlet Lancets MISC Use to check blood sugar tid.  Dx code: E11.9 300 each 1   . predniSONE (DELTASONE) 5 MG tablet Take 1 tablet by mouth daily as needed.     . rosuvastatin (CRESTOR) 10 MG tablet Take 1 tablet (10 mg total) by mouth daily. 90 tablet 1  . sulfamethoxazole-trimethoprim (BACTRIM DS) 800-160 MG tablet Take 1 tablet by mouth 2 (two) times daily. 20 tablet 0  . ticagrelor (BRILINTA) 90 MG TABS tablet TAKE 1 TABLET (90 MG TOTAL) BY MOUTH 2 (TWO) TIMES DAILY. 90 tablet 1  . zolpidem (AMBIEN) 10 MG tablet TAKE 1 TABLET(10 MG) BY MOUTH AT BEDTIME AS NEEDED 15 tablet 5   No current facility-administered medications for this visit.    Allergies:  Patient has no known allergies.    Social History:  The patient  reports that he quit smoking about 9 months ago. His smoking use included cigarettes. He has never used smokeless tobacco. He reports that he does not drink alcohol and does not use drugs.   Family History:  The patient's family history includes Alcohol abuse in his brother; Diabetes in his brother; Diabetes type II in his brother; Stomach cancer in his mother.    ROS:  Please see the history of present illness.   Otherwise, review of systems are positive for none.   All other systems are reviewed and negative.    PHYSICAL EXAM: VS:  BP (!) 142/80 (BP Location: Left Arm, Patient Position: Sitting, Cuff Size: Normal)   Pulse (!) 105   Ht $R'5\' 9"'EG$  (1.753 m)   Wt 250 lb 8 oz (113.6 kg)   SpO2 98%   BMI 36.99 kg/m  , BMI Body mass index is 36.99 kg/m. GEN: Well nourished, well developed, in no acute distress  HEENT: normal  Neck: no JVD, carotid bruits, or masses Cardiac: RRR; no murmurs, rubs, or gallops,no edema  Respiratory:  clear to auscultation bilaterally, normal work of breathing GI: soft, nontender, nondistended, + BS MS: no deformity or atrophy  Skin: warm and dry, no rash Neuro:  Strength and sensation are intact Psych: euthymic mood, full affect    EKG:  EKG is ordered today. The ekg ordered today demonstrates normal sinus rhythm  with old anteroseptal infarct.   Recent Labs: 10/29/2019: Magnesium 2.0 08/18/2020: ALT 43; BUN 14; Creatinine, Ser 0.74; Hemoglobin 13.8; Platelets 274.0; Potassium 3.7; Sodium 136; TSH 2.21    Lipid Panel    Component Value Date/Time   CHOL 439 (H) 08/18/2020 1029   CHOL 949 (H) 10/27/2014 1322   TRIG (H) 08/18/2020 1029    1027.0 Triglyceride is over 400; calculations on Lipids are invalid.   TRIG 1,098 (H) 10/30/2014 0605   HDL 31.00 (L) 08/18/2020 1029   HDL 20 (L) 10/27/2014 1322   CHOLHDL 14 08/18/2020 1029   VLDL 45.0 (H) 01/31/2020 1043   VLDL SEE COMMENT 10/27/2014 1322   LDLCALC UNABLE TO CALCULATE IF TRIGLYCERIDE OVER 400 mg/dL 10/27/2019 1406   LDLCALC SEE COMMENT 10/27/2014 1322   LDLDIRECT 64.0 08/18/2020 1029      Wt Readings from Last 3 Encounters:  08/19/20 250 lb 8 oz (113.6 kg)  08/18/20 247 lb 12.8 oz (112.4 kg)  02/14/20 (!) 235 lb 6 oz (106.8 kg)        PAD Screen 12/07/2017  Previous PAD dx? No  Previous surgical procedure? No  Pain with walking? No  Feet/toe relief with dangling? Yes  Painful, non-healing ulcers? No  Extremities discolored? No      ASSESSMENT AND PLAN:  1.  Coronary artery disease involving native coronary arteries without angina: He is doing well at the present time.  I had a prolonged discussion with him about the importance of compliance with healthy diet and taking his medications as prescribed.  I asked him to resume aspirin 81 mg once daily.  Fortunately, he has been taking Brilinta twice daily.   2.  Chronic systolic heart failure due to ischemic cardiomyopathy: No evidence of significant volume overload.  Ejection fraction was 35 to 40%.  He has sinus tachycardia likely due to running out of Toprol which was resumed today.  In addition, I resumed losartan.  No point of repeating his echocardiogram given that he has not been  compliant with medications.  3.  Essential hypertension: Blood pressure is mildly elevated.  I  resumed losartan and Toprol.  4.  Hyperlipidemia: I discussed with him the importance of taking rosuvastatin as prescribed.  His LDL was at target.  However, his triglyceride was above 1000 likely due to poor diet and poor glycemic control.  He promised to do better.  I provided him with heart healthy diet instructions.  5.  COVID-19 education: The patient is not vaccinated and was concerned about taking the vaccine due to his cardiac history.  I explained to him that in the contrary, he should be first in line to get the vaccine given his cardiac history.   Disposition:   FU with me in 6 months.  Signed,  Kathlyn Sacramento, MD  08/19/2020 4:16 PM    [place]

## 2020-08-19 NOTE — Assessment & Plan Note (Signed)
hgba1c unacceptable but has not been taking his insulin, minimize simple carbs. Increase exercise as tolerated. Continue current meds

## 2020-08-19 NOTE — Assessment & Plan Note (Signed)
Encouraged good sleep hygiene such as dark, quiet room. No blue/green glowing lights such as computer screens in bedroom. No alcohol or stimulants in evening. Cut down on caffeine as able. Regular exercise is helpful but not just prior to bed time. Refill given on Ambien

## 2020-08-19 NOTE — Assessment & Plan Note (Signed)
Doing much better on current, follows with specialty

## 2020-08-19 NOTE — Assessment & Plan Note (Signed)
Patient encouraged to maintain heart healthy diet, regular exercise, adequate sleep. Consider daily probiotics. Take medications as prescribed. Labs ordered and reviewed 

## 2020-08-19 NOTE — Assessment & Plan Note (Signed)
Encouraged heart healthy diet, increase exercise, avoid trans fats, consider a krill oil cap daily. Encouraged Atorvastatin.

## 2020-08-19 NOTE — Assessment & Plan Note (Signed)
Encouraged DASH diet, decrease po intake and increase exercise as tolerated. Needs 7-8 hours of sleep nightly. Avoid trans fats, eat small, frequent meals every 4-5 hours with lean proteins, complex carbs and healthy fats. Minimize simple carbs 

## 2020-08-19 NOTE — Patient Instructions (Addendum)
Medication Instructions:  Your physician recommends that you continue on your current medications as directed. Please refer to the Current Medication list given to you today.   Your cardiac medications have been refilled today.  *If you need a refill on your cardiac medications before your next appointment, please call your pharmacy*   Lab Work: None ordered If you have labs (blood work) drawn today and your tests are completely normal, you will receive your results only by: Marland Kitchen MyChart Message (if you have MyChart) OR . A paper copy in the mail If you have any lab test that is abnormal or we need to change your treatment, we will call you to review the results.   Testing/Procedures: None ordered   Follow-Up: At [facility], you and your health needs are our priority.  As part of our continuing mission to provide you with exceptional heart care, we have created designated Provider Care Teams.  These Care Teams include your primary Cardiologist (physician) and Advanced Practice Providers (APPs -  Physician Assistants and Nurse Practitioners) who all work together to provide you with the care you need, when you need it.  We recommend signing up for the patient portal called "MyChart".  Sign up information is provided on this After Visit Summary.  MyChart is used to connect with patients for Virtual Visits (Telemedicine).  Patients are able to view lab/test results, encounter notes, upcoming appointments, etc.  Non-urgent messages can be sent to your provider as well.   To learn more about what you can do with MyChart, go to [email or web address].    Your next appointment:   6 month(s)  The format for your next appointment:   In Person  Provider:   You may see Kathlyn Sacramento, MD or one of the following Advanced Practice Providers on your designated Care Team:    Murray Hodgkins, NP  Christell Faith, PA-C  Cadence Kathlen Mody, PA-C  Laurann Montana, NP

## 2020-08-19 NOTE — Assessment & Plan Note (Signed)
Well controlled, no changes to meds. Encouraged heart healthy diet such as the DASH diet and exercise as tolerated.  °

## 2020-08-20 ENCOUNTER — Other Ambulatory Visit: Payer: Self-pay | Admitting: Family Medicine

## 2020-08-20 DIAGNOSIS — Z87891 Personal history of nicotine dependence: Secondary | ICD-10-CM

## 2020-08-20 LAB — DRUG MONITORING, PANEL 8 WITH CONFIRMATION, URINE
6 Acetylmorphine: NEGATIVE ng/mL (ref <10)
Alcohol Metabolites: NEGATIVE ng/mL
Alphahydroxyalprazolam: NEGATIVE ng/mL (ref <25)
Alphahydroxymidazolam: NEGATIVE ng/mL (ref <50)
Alphahydroxytriazolam: NEGATIVE ng/mL (ref <50)
Aminoclonazepam: NEGATIVE ng/mL (ref <25)
Amphetamines: NEGATIVE ng/mL (ref <500)
Benzodiazepines: POSITIVE ng/mL — AB (ref <100)
Buprenorphine, Urine: NEGATIVE ng/mL (ref <5)
Cocaine Metabolite: NEGATIVE ng/mL (ref <150)
Creatinine: 125.8 mg/dL
Hydroxyethylflurazepam: NEGATIVE ng/mL (ref <50)
Lorazepam: NEGATIVE ng/mL (ref <50)
MDMA: NEGATIVE ng/mL (ref <500)
Marijuana Metabolite: NEGATIVE ng/mL (ref <20)
Nordiazepam: NEGATIVE ng/mL (ref <50)
Opiates: NEGATIVE ng/mL (ref <100)
Oxazepam: 114 ng/mL — ABNORMAL HIGH (ref <50)
Oxidant: NEGATIVE ug/mL
Oxycodone: NEGATIVE ng/mL (ref <100)
Temazepam: 88 ng/mL — ABNORMAL HIGH (ref <50)
pH: 5.4 (ref 4.5–9.0)

## 2020-08-20 LAB — DM TEMPLATE

## 2020-08-25 ENCOUNTER — Other Ambulatory Visit: Payer: Self-pay | Admitting: Cardiovascular Disease

## 2020-09-08 ENCOUNTER — Telehealth: Payer: Self-pay | Admitting: Acute Care

## 2020-09-08 NOTE — Telephone Encounter (Signed)
Spoke with pt regarding lung cancer screening. Pt states that he is not interested in participating at this time. Referral has been cancelled. Letter sent to Dr Charlett Blake to make her aware.

## 2020-09-22 ENCOUNTER — Other Ambulatory Visit: Payer: Self-pay | Admitting: Family Medicine

## 2020-10-18 ENCOUNTER — Inpatient Hospital Stay (HOSPITAL_BASED_OUTPATIENT_CLINIC_OR_DEPARTMENT_OTHER)
Admission: EM | Admit: 2020-10-18 | Discharge: 2020-10-21 | DRG: 041 | Disposition: A | Discharge status: Rehabilitation Facility | Payer: Commercial Managed Care - PPO | Attending: Family Medicine | Admitting: Family Medicine

## 2020-10-18 ENCOUNTER — Emergency Department (HOSPITAL_BASED_OUTPATIENT_CLINIC_OR_DEPARTMENT_OTHER): Payer: Commercial Managed Care - PPO

## 2020-10-18 DIAGNOSIS — I2510 Atherosclerotic heart disease of native coronary artery without angina pectoris: Secondary | ICD-10-CM | POA: Diagnosis present

## 2020-10-18 DIAGNOSIS — E1142 Type 2 diabetes mellitus with diabetic polyneuropathy: Secondary | ICD-10-CM | POA: Diagnosis present

## 2020-10-18 DIAGNOSIS — K219 Gastro-esophageal reflux disease without esophagitis: Secondary | ICD-10-CM | POA: Diagnosis present

## 2020-10-18 DIAGNOSIS — G4700 Insomnia, unspecified: Secondary | ICD-10-CM | POA: Diagnosis present

## 2020-10-18 DIAGNOSIS — R42 Dizziness and giddiness: Secondary | ICD-10-CM

## 2020-10-18 DIAGNOSIS — Z87891 Personal history of nicotine dependence: Secondary | ICD-10-CM

## 2020-10-18 DIAGNOSIS — E669 Obesity, unspecified: Secondary | ICD-10-CM | POA: Diagnosis present

## 2020-10-18 DIAGNOSIS — Z79899 Other long term (current) drug therapy: Secondary | ICD-10-CM | POA: Diagnosis not present

## 2020-10-18 DIAGNOSIS — F419 Anxiety disorder, unspecified: Secondary | ICD-10-CM | POA: Diagnosis not present

## 2020-10-18 DIAGNOSIS — R29701 NIHSS score 1: Secondary | ICD-10-CM | POA: Diagnosis present

## 2020-10-18 DIAGNOSIS — I110 Hypertensive heart disease with heart failure: Secondary | ICD-10-CM | POA: Diagnosis present

## 2020-10-18 DIAGNOSIS — W010XXA Fall on same level from slipping, tripping and stumbling without subsequent striking against object, initial encounter: Secondary | ICD-10-CM | POA: Diagnosis not present

## 2020-10-18 DIAGNOSIS — K760 Fatty (change of) liver, not elsewhere classified: Secondary | ICD-10-CM | POA: Diagnosis present

## 2020-10-18 DIAGNOSIS — R278 Other lack of coordination: Secondary | ICD-10-CM | POA: Diagnosis present

## 2020-10-18 DIAGNOSIS — Z6838 Body mass index (BMI) 38.0-38.9, adult: Secondary | ICD-10-CM

## 2020-10-18 DIAGNOSIS — I252 Old myocardial infarction: Secondary | ICD-10-CM | POA: Diagnosis not present

## 2020-10-18 DIAGNOSIS — I6340 Cerebral infarction due to embolism of unspecified cerebral artery: Secondary | ICD-10-CM | POA: Diagnosis present

## 2020-10-18 DIAGNOSIS — Z9049 Acquired absence of other specified parts of digestive tract: Secondary | ICD-10-CM

## 2020-10-18 DIAGNOSIS — Z7982 Long term (current) use of aspirin: Secondary | ICD-10-CM | POA: Diagnosis not present

## 2020-10-18 DIAGNOSIS — I10 Essential (primary) hypertension: Secondary | ICD-10-CM | POA: Diagnosis present

## 2020-10-18 DIAGNOSIS — Z8249 Family history of ischemic heart disease and other diseases of the circulatory system: Secondary | ICD-10-CM

## 2020-10-18 DIAGNOSIS — Z20822 Contact with and (suspected) exposure to covid-19: Secondary | ICD-10-CM | POA: Diagnosis present

## 2020-10-18 DIAGNOSIS — D649 Anemia, unspecified: Secondary | ICD-10-CM | POA: Diagnosis present

## 2020-10-18 DIAGNOSIS — Z85038 Personal history of other malignant neoplasm of large intestine: Secondary | ICD-10-CM | POA: Diagnosis present

## 2020-10-18 DIAGNOSIS — Z794 Long term (current) use of insulin: Secondary | ICD-10-CM

## 2020-10-18 DIAGNOSIS — W19XXXA Unspecified fall, initial encounter: Secondary | ICD-10-CM

## 2020-10-18 DIAGNOSIS — E782 Mixed hyperlipidemia: Secondary | ICD-10-CM | POA: Diagnosis present

## 2020-10-18 DIAGNOSIS — I5042 Chronic combined systolic (congestive) and diastolic (congestive) heart failure: Secondary | ICD-10-CM | POA: Diagnosis present

## 2020-10-18 DIAGNOSIS — K589 Irritable bowel syndrome without diarrhea: Secondary | ICD-10-CM | POA: Diagnosis present

## 2020-10-18 DIAGNOSIS — E781 Pure hyperglyceridemia: Secondary | ICD-10-CM | POA: Diagnosis present

## 2020-10-18 DIAGNOSIS — I6349 Cerebral infarction due to embolism of other cerebral artery: Secondary | ICD-10-CM

## 2020-10-18 DIAGNOSIS — L4050 Arthropathic psoriasis, unspecified: Secondary | ICD-10-CM | POA: Diagnosis not present

## 2020-10-18 DIAGNOSIS — Z9114 Patient's other noncompliance with medication regimen: Secondary | ICD-10-CM

## 2020-10-18 DIAGNOSIS — Z28310 Unvaccinated for covid-19: Secondary | ICD-10-CM

## 2020-10-18 DIAGNOSIS — Z888 Allergy status to other drugs, medicaments and biological substances status: Secondary | ICD-10-CM

## 2020-10-18 DIAGNOSIS — Z833 Family history of diabetes mellitus: Secondary | ICD-10-CM

## 2020-10-18 DIAGNOSIS — I639 Cerebral infarction, unspecified: Secondary | ICD-10-CM | POA: Diagnosis present

## 2020-10-18 LAB — COMPREHENSIVE METABOLIC PANEL
ALT: 40 U/L (ref 0–44)
AST: 32 U/L (ref 15–41)
Albumin: 3.8 g/dL (ref 3.5–5.0)
Alkaline Phosphatase: 38 U/L (ref 38–126)
Anion gap: 10 (ref 5–15)
BUN: 12 mg/dL (ref 6–20)
CO2: 21 mmol/L — ABNORMAL LOW (ref 22–32)
Calcium: 8.6 mg/dL — ABNORMAL LOW (ref 8.9–10.3)
Chloride: 104 mmol/L (ref 98–111)
Creatinine, Ser: 0.82 mg/dL (ref 0.61–1.24)
GFR, Estimated: >60 mL/min (ref >60)
Glucose, Bld: 184 mg/dL — ABNORMAL HIGH (ref 70–99)
Potassium: 3.5 mmol/L (ref 3.5–5.1)
Sodium: 135 mmol/L (ref 135–145)
Total Bilirubin: 2 mg/dL — ABNORMAL HIGH (ref 0.3–1.2)
Total Protein: 7.1 g/dL (ref 6.5–8.1)

## 2020-10-18 LAB — URINALYSIS, ROUTINE W REFLEX MICROSCOPIC
Glucose, UA: >=500 mg/dL — AB
Hgb urine dipstick: NEGATIVE
Ketones, ur: 15 mg/dL — AB
Leukocytes,Ua: NEGATIVE
Nitrite: NEGATIVE
Protein, ur: NEGATIVE mg/dL
Specific Gravity, Urine: >1.03 — ABNORMAL HIGH (ref 1.005–1.030)
pH: 5.5 (ref 5.0–8.0)

## 2020-10-18 LAB — CBC WITH DIFFERENTIAL/PLATELET
Abs Immature Granulocytes: 0.03 10*3/uL (ref 0.00–0.07)
Basophils Absolute: 0.1 10*3/uL (ref 0.0–0.1)
Basophils Relative: 1 %
Eosinophils Absolute: 0.1 10*3/uL (ref 0.0–0.5)
Eosinophils Relative: 1 %
HCT: 38.7 % — ABNORMAL LOW (ref 39.0–52.0)
Hemoglobin: 13.5 g/dL (ref 13.0–17.0)
Immature Granulocytes: 0 %
Lymphocytes Relative: 25 %
Lymphs Abs: 2.3 10*3/uL (ref 0.7–4.0)
MCH: 33.2 pg (ref 26.0–34.0)
MCHC: 34.9 g/dL (ref 30.0–36.0)
MCV: 95.1 fL (ref 80.0–100.0)
Monocytes Absolute: 0.9 10*3/uL (ref 0.1–1.0)
Monocytes Relative: 10 %
Neutro Abs: 6 10*3/uL (ref 1.7–7.7)
Neutrophils Relative %: 63 %
Platelets: 283 10*3/uL (ref 150–400)
RBC: 4.07 MIL/uL — ABNORMAL LOW (ref 4.22–5.81)
RDW: 13.5 % (ref 11.5–15.5)
WBC: 9.4 10*3/uL (ref 4.0–10.5)
nRBC: 0 % (ref 0.0–0.2)

## 2020-10-18 LAB — URINALYSIS, MICROSCOPIC (REFLEX)

## 2020-10-18 MED ORDER — CLOPIDOGREL BISULFATE 300 MG PO TABS
300.0000 mg | ORAL_TABLET | Freq: Once | ORAL | Status: AC
  Administered 2020-10-18: 300 mg via ORAL
  Filled 2020-10-18: qty 1

## 2020-10-18 MED ORDER — ASPIRIN 81 MG PO CHEW
81.0000 mg | CHEWABLE_TABLET | Freq: Once | ORAL | Status: AC
  Administered 2020-10-18: 81 mg via ORAL
  Filled 2020-10-18: qty 1

## 2020-10-18 MED ORDER — LORAZEPAM 2 MG/ML IJ SOLN
0.5000 mg | Freq: Once | INTRAMUSCULAR | Status: DC | PRN
  Filled 2020-10-18: qty 1
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
## 2020-10-18 NOTE — ED Notes (Signed)
Assumed care of patient. Vitals taken. A&Ox4. Respirations regular/unlabored. Connected to BP, cardiac monitor and pulse ox.  Stretcher low, wheels locked, call bell within reach. Family at bedside. Patient awaiting update.

## 2020-10-18 NOTE — ED Notes (Signed)
Patient transferred with steady gait, NAD, to chair per request. Pt placed in gown/shoes and with family at bedside.

## 2020-10-18 NOTE — ED Notes (Signed)
Patient transported to CT 

## 2020-10-18 NOTE — ED Provider Notes (Signed)
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 or significant regional mass effect. 2. No other new acute intracranial abnormality. CTA HEAD AND NECK IMPRESSION: 1. Negative CTA for large vessel occlusion. 2. Short-segment moderate to severe left P2 stenosis. 3. 50% atheromatous stenosis about the left carotid bulb/proximal left ICA. 4. Additional mild for age atheromatous change elsewhere about the major arterial vasculature of the head and neck. No other hemodynamically significant or correctable stenosis. Electronically Signed   By: BJeannine BogaM.D.   On: 10/19/2020 03:45   MR BRAIN WO CONTRAST  Result Date: 10/18/2020 CLINICAL DATA:  Initial evaluation for  acute stroke. EXAM: MRI HEAD WITHOUT CONTRAST TECHNIQUE: Multiplanar, multiecho pulse sequences of the brain and surrounding structures were obtained without intravenous contrast. COMPARISON:  Prior CT from earlier the same day. FINDINGS: Brain: Cerebral volume within normal limits for age. Minimal T2/FLAIR hyperintensity seen involving the periventricular and deep white  matter of both cerebral hemispheres most like related chronic microvascular ischemic disease, mild for age. 3.4 cm focus of restricted diffusion involving the posterior left thalamus/periventricular white matter is seen, consistent with an acute ischemic infarct (series 2, image 90). Multiple additional scattered subcentimeter cortical and subcortical ischemic infarcts seen involving the bilateral parieto-occipital regions (series 2, images 95, 84, 81). Few additional scattered subcentimeter ischemic infarcts seen involving the right cerebellum (series 2, images 75, 74, 73). No associated hemorrhage or mass effect. Findings are likely embolic in nature. Gray-white matter differentiation otherwise maintained. No other areas of chronic cortical infarction. No other foci of susceptibility artifact to suggest acute or chronic intracranial hemorrhage. No mass lesion, midline shift or mass effect. No hydrocephalus or extra-axial fluid collection. Pituitary gland suprasellar region within normal limits. Midline structures intact. Vascular: Major intracranial vascular flow voids are maintained. Skull and upper cervical spine: Craniocervical junction within normal limits. Bone marrow signal intensity normal. No focal marrow replacing lesion. No scalp soft tissue abnormality. Sinuses/Orbits: Globes and orbital soft tissues within normal limits. Mild scattered mucosal thickening noted within the ethmoidal air cells and maxillary sinuses. Paranasal sinuses are otherwise clear. No significant mastoid effusion. Inner ear structures grossly normal. Other: None. IMPRESSION: 1. 3.4 cm acute ischemic nonhemorrhagic infarct involving the posterior left thalamus/periventricular white matter. 2. Additional scattered subcentimeter cortical and subcortical ischemic infarcts involving the bilateral parieto-occipital regions and right cerebellum. Findings are likely embolic in nature. 3. Underlying mild chronic microvascular ischemic  disease. Electronically Signed   By: Jeannine Boga M.D.   On: 10/18/2020 20:55   DG Chest Port 1 View  Result Date: 10/19/2020 CLINICAL DATA:  60 year old male with left thalamic and other small scattered cerebral infarcts. EXAM: PORTABLE CHEST 1 VIEW COMPARISON:  Chest radiographs 11/05/2019 and earlier. CTA neck 0310 hours today. FINDINGS: Portable AP upright view at 1027 hours. Lung volumes and mediastinal contours remain normal. Visualized tracheal air column is within normal limits. Allowing for portable technique the lungs are clear. No pneumothorax or pleural effusion. Paucity of bowel gas in the upper abdomen. No acute osseous abnormality identified. IMPRESSION: Negative portable chest. Electronically Signed   By: Genevie Ann M.D.   On: 10/19/2020 10:41   ECHOCARDIOGRAM COMPLETE  Result Date: 10/19/2020    ECHOCARDIOGRAM REPORT   Patient Name:   [name] Date of Exam: 10/19/2020 Medical Rec #:  [id number]      Height:       69.0 in Accession #:    [id number]     Weight:       250.5 lb Date of Birth:  May 16, 1961      BSA:          2.273 m Patient Age:    45 years       BP:           120/66 mmHg Patient Gender: M              HR:           95 bpm. Exam Location:  Inpatient Procedure: 2D Echo, Cardiac Doppler, Color Doppler and Intracardiac            Opacification Agent Indications:  Stroke I63.9  History:        Patient has prior history of Echocardiogram examinations, most                 recent 10/28/2019. Risk Factors:Hypertension, Dyslipidemia and                 Diabetes. Cancer. GERD.  Sonographer:    Jonelle Sidle Dance Referring Phys: [id number]  1. Left ventricular ejection fraction, by estimation, is 55 to 60%. The left ventricle has normal function. The left ventricle demonstrates regional wall motion abnormalities (see scoring diagram/findings for description). There is mild left ventricular  hypertrophy. Left ventricular diastolic parameters are consistent with  Grade II diastolic dysfunction (pseudonormalization).  2. Right ventricular systolic function is normal. The right ventricular size is normal.  3. Left atrial size was moderately dilated.  4. The mitral valve is normal in structure. No evidence of mitral valve regurgitation. No evidence of mitral stenosis.  5. The aortic valve is normal in structure. Aortic valve regurgitation is not visualized. No aortic stenosis is present.  6. The inferior vena cava is normal in size with greater than 50% respiratory variability, suggesting right atrial pressure of 3 mmHg. Conclusion(s)/Recommendation(s): No intracardiac source of embolism detected on this transthoracic study. A transesophageal echocardiogram is recommended to exclude cardiac source of embolism if clinically indicated. FINDINGS  Left Ventricle: Left ventricular ejection fraction, by estimation, is 55 to 60%. The left ventricle has normal function. The left ventricle demonstrates regional wall motion abnormalities. Definity contrast agent was given IV to delineate the left ventricular endocardial borders. The left ventricular internal cavity size was normal in size. There is mild left ventricular hypertrophy. Left ventricular diastolic parameters are consistent with Grade II diastolic dysfunction (pseudonormalization).  LV Wall Scoring: The apical septal segment and apex are akinetic. Right Ventricle: The right ventricular size is normal. No increase in right ventricular wall thickness. Right ventricular systolic function is normal. Left Atrium: Left atrial size was moderately dilated. Right Atrium: Right atrial size was normal in size. Pericardium: There is no evidence of pericardial effusion. Mitral Valve: The mitral valve is normal in structure. Mild mitral annular calcification. No evidence of mitral valve regurgitation. No evidence of mitral valve stenosis. Tricuspid Valve: The tricuspid valve is normal in structure. Tricuspid valve regurgitation is not  demonstrated. No evidence of tricuspid stenosis. Aortic Valve: The aortic valve is normal in structure. Aortic valve regurgitation is not visualized. No aortic stenosis is present. Pulmonic Valve: The pulmonic valve was normal in structure. Pulmonic valve regurgitation is not visualized. No evidence of pulmonic stenosis. Aorta: The aortic root is normal in size and structure. Venous: The inferior vena cava is normal in size with greater than 50% respiratory variability, suggesting right atrial pressure of 3 mmHg. IAS/Shunts: No atrial level shunt detected by color flow Doppler.  LEFT VENTRICLE PLAX 2D LVIDd:         4.60 cm  Diastology LVIDs:         3.30 cm  LV e' medial:    8.92 cm/s LV PW:         1.30 cm  LV E/e' medial:  7.8 LV IVS:        1.10 cm  LV e' lateral:   9.90 cm/s LVOT diam:     1.90 cm  LV E/e' lateral: 7.0 LV SV:         52 LV SV Index:   23 LVOT Area:  2.84 cm  RIGHT VENTRICLE             IVC RV Basal diam:  3.10 cm     IVC diam: 1.80 cm RV Mid diam:    1.70 cm RV S prime:     13.40 cm/s TAPSE (M-mode): 1.9 cm LEFT ATRIUM              Index       RIGHT ATRIUM           Index LA diam:        4.70 cm  2.07 cm/m  RA Area:     15.00 cm LA Vol (A2C):   111.0 ml 48.82 ml/m RA Volume:   31.70 ml  13.94 ml/m LA Vol (A4C):   82.4 ml  36.24 ml/m LA Biplane Vol: 97.3 ml  42.80 ml/m  AORTIC VALVE LVOT Vmax:   94.60 cm/s LVOT Vmean:  62.900 cm/s LVOT VTI:    0.183 m  AORTA Ao Root diam: 3.70 cm Ao Asc diam:  3.10 cm MITRAL VALVE MV Area (PHT): 3.91 cm    SHUNTS MV Decel Time: 194 msec    Systemic VTI:  0.18 m MV E velocity: 69.40 cm/s  Systemic Diam: 1.90 cm MV A velocity: 55.70 cm/s MV E/A ratio:  1.25 Candee Furbish MD Electronically signed by Candee Furbish MD Signature Date/Time: 10/19/2020/4:45:27 PM    Final    VAS [place] LOWER EXTREMITY VENOUS (DVT)  Result Date: 10/19/2020  Lower Venous DVT Study Indications: Stroke.  Comparison Study: No prior study on file Performing Technologist: Sharion Dove  RVS  Examination Guidelines: A complete evaluation includes B-mode imaging, spectral Doppler, color Doppler, and power Doppler as needed of all accessible portions of each vessel. Bilateral testing is considered an integral part of a complete examination. Limited examinations for reoccurring indications may be performed as noted. The reflux portion of the exam is performed with the patient in reverse Trendelenburg.  +---------+---------------+---------+-----------+----------+--------------+ RIGHT    CompressibilityPhasicitySpontaneityPropertiesThrombus Aging +---------+---------------+---------+-----------+----------+--------------+ CFV      Full           Yes      Yes                                 +---------+---------------+---------+-----------+----------+--------------+ SFJ      Full                                                        +---------+---------------+---------+-----------+----------+--------------+ FV Prox  Full                                                        +---------+---------------+---------+-----------+----------+--------------+ FV Mid   Full                                                        +---------+---------------+---------+-----------+----------+--------------+ FV DistalFull                                                        +---------+---------------+---------+-----------+----------+--------------+  PFV      Full                                                        +---------+---------------+---------+-----------+----------+--------------+ POP      Full           Yes      Yes                                 +---------+---------------+---------+-----------+----------+--------------+ PTV      Full                                                        +---------+---------------+---------+-----------+----------+--------------+ PERO     Full                                                         +---------+---------------+---------+-----------+----------+--------------+   +---------+---------------+---------+-----------+----------+--------------+ LEFT     CompressibilityPhasicitySpontaneityPropertiesThrombus Aging +---------+---------------+---------+-----------+----------+--------------+ CFV      Full           Yes      Yes                                 +---------+---------------+---------+-----------+----------+--------------+ SFJ      Full                                                        +---------+---------------+---------+-----------+----------+--------------+ FV Prox  Full                                                        +---------+---------------+---------+-----------+----------+--------------+ FV Mid   Full                                                        +---------+---------------+---------+-----------+----------+--------------+ FV DistalFull                                                        +---------+---------------+---------+-----------+----------+--------------+ PFV      Full                                                        +---------+---------------+---------+-----------+----------+--------------+  POP      Full           Yes      Yes                                 +---------+---------------+---------+-----------+----------+--------------+ PTV      Full                                                        +---------+---------------+---------+-----------+----------+--------------+ PERO     Full                                                        +---------+---------------+---------+-----------+----------+--------------+     Summary: BILATERAL: - No evidence of deep vein thrombosis seen in the lower extremities, bilaterally. -   *See table(s) above for measurements and observations. Electronically signed by Monica Martinez MD on 10/19/2020 at 4:23:09 PM.    Final     Procedures .Critical  Care Performed by: Gareth Morgan, MD Authorized by: Gareth Morgan, MD   Critical care provider statement:    Critical care time (minutes):  30   Critical care was time spent personally by me on the following activities:  Discussions with consultants, evaluation of patient's response to treatment, examination of patient, ordering and performing treatments and interventions, ordering and review of laboratory studies, ordering and review of radiographic studies, pulse oximetry, re-evaluation of patient's condition, obtaining history from patient or surrogate and review of old charts     Medications Ordered in ED Medications  LORazepam (ATIVAN) injection 0.5 mg (has no administration in time range)  insulin aspart (novoLOG) injection 0-9 Units (3 Units Subcutaneous Given 10/19/20 2318)  aspirin EC tablet 81 mg (81 mg Oral Given 10/19/20 1037)  diazepam (VALIUM) tablet 5 mg (5 mg Oral Given 02/19/24 4982)  folic acid (FOLVITE) tablet 1 mg (1 mg Oral Given 10/19/20 1036)  insulin glargine (LANTUS) injection 40 Units (40 Units Subcutaneous Given 10/19/20 1038)   stroke: mapping our early stages of recovery book (has no administration in time range)  0.9 %  sodium chloride infusion ( Intravenous New Bag/Given 10/19/20 2145)  acetaminophen (TYLENOL) tablet 650 mg (has no administration in time range)    Or  acetaminophen (TYLENOL) 160 MG/5ML solution 650 mg (has no administration in time range)    Or  acetaminophen (TYLENOL) suppository 650 mg (has no administration in time range)  ticagrelor (BRILINTA) tablet 90 mg (90 mg Oral Given 10/19/20 2139)  perflutren lipid microspheres (DEFINITY) IV suspension (2 mLs Intravenous Given 10/19/20 1611)  pravastatin (PRAVACHOL) tablet 20 mg (20 mg Oral Given 10/19/20 2138)  clopidogrel (PLAVIX) tablet 300 mg (300 mg Oral Given 10/18/20 2150)  aspirin chewable tablet 81 mg (81 mg Oral Given 10/18/20 2150)  melatonin tablet 3 mg (3 mg Oral Given 10/19/20 0327)  iohexol  (OMNIPAQUE) 350 MG/ML injection 80 mL (80 mLs Intravenous Contrast Given 10/19/20 6415)    ED Course  I have reviewed the triage vital signs and the nursing notes.  Pertinent labs & imaging results that were available during my care of the patient  were reviewed by me and considered in my medical decision making (see chart for details).    MDM Rules/Calculators/A&P                          60 year old left handed male with history of hypertension, diabetes, hyperlipidemia, colon cancer, psoriatic arthritis, presents with concern for feeling off balance since a fall yesterday.  Out of tPA window and no sign of LVO on exam.   Labs show no sign of significant anemia or electrolyte abnormalities.  EKG shows no acute findings.  CT head completed given history of trauma shows no acute findings.  Ordered an MRI which shows multifocal embolic appearing strokes including in the right cerebellum and left thalamus.  Discussed with Neurology, Dr. Leonel Ramsay. Recommends aspirin, plavix and transfer to [facility] for further work up.      Final Clinical Impression(s) / ED Diagnoses Final diagnoses:  Cerebrovascular accident (CVA) due to embolism of other cerebral artery ([place])  Fall, initial encounter  Dysequilibrium    Rx / DC Orders ED Discharge Orders    None       Gareth Morgan, MD 10/20/20 [phone]

## 2020-10-18 NOTE — ED Notes (Signed)
Patient placed back into stretcher with steady gait. NAD. Vitals taken. Family at bedside.

## 2020-10-18 NOTE — ED Notes (Signed)
Patient transported to MRI 

## 2020-10-19 ENCOUNTER — Observation Stay (HOSPITAL_COMMUNITY): Payer: Commercial Managed Care - PPO

## 2020-10-19 ENCOUNTER — Inpatient Hospital Stay (HOSPITAL_COMMUNITY): Payer: Commercial Managed Care - PPO

## 2020-10-19 DIAGNOSIS — E782 Mixed hyperlipidemia: Secondary | ICD-10-CM

## 2020-10-19 DIAGNOSIS — R29701 NIHSS score 1: Secondary | ICD-10-CM | POA: Diagnosis present

## 2020-10-19 DIAGNOSIS — K219 Gastro-esophageal reflux disease without esophagitis: Secondary | ICD-10-CM | POA: Diagnosis present

## 2020-10-19 DIAGNOSIS — Z79899 Other long term (current) drug therapy: Secondary | ICD-10-CM | POA: Diagnosis not present

## 2020-10-19 DIAGNOSIS — E781 Pure hyperglyceridemia: Secondary | ICD-10-CM

## 2020-10-19 DIAGNOSIS — L4050 Arthropathic psoriasis, unspecified: Secondary | ICD-10-CM | POA: Diagnosis present

## 2020-10-19 DIAGNOSIS — I6389 Other cerebral infarction: Secondary | ICD-10-CM | POA: Diagnosis not present

## 2020-10-19 DIAGNOSIS — R269 Unspecified abnormalities of gait and mobility: Secondary | ICD-10-CM | POA: Diagnosis not present

## 2020-10-19 DIAGNOSIS — I10 Essential (primary) hypertension: Secondary | ICD-10-CM | POA: Diagnosis not present

## 2020-10-19 DIAGNOSIS — R42 Dizziness and giddiness: Secondary | ICD-10-CM | POA: Diagnosis not present

## 2020-10-19 DIAGNOSIS — I5042 Chronic combined systolic (congestive) and diastolic (congestive) heart failure: Secondary | ICD-10-CM | POA: Diagnosis present

## 2020-10-19 DIAGNOSIS — R278 Other lack of coordination: Secondary | ICD-10-CM | POA: Diagnosis present

## 2020-10-19 DIAGNOSIS — Z20822 Contact with and (suspected) exposure to covid-19: Secondary | ICD-10-CM | POA: Diagnosis present

## 2020-10-19 DIAGNOSIS — Z6838 Body mass index (BMI) 38.0-38.9, adult: Secondary | ICD-10-CM | POA: Diagnosis not present

## 2020-10-19 DIAGNOSIS — K760 Fatty (change of) liver, not elsewhere classified: Secondary | ICD-10-CM | POA: Diagnosis present

## 2020-10-19 DIAGNOSIS — E1142 Type 2 diabetes mellitus with diabetic polyneuropathy: Secondary | ICD-10-CM | POA: Diagnosis present

## 2020-10-19 DIAGNOSIS — Z87891 Personal history of nicotine dependence: Secondary | ICD-10-CM | POA: Diagnosis not present

## 2020-10-19 DIAGNOSIS — E1165 Type 2 diabetes mellitus with hyperglycemia: Secondary | ICD-10-CM

## 2020-10-19 DIAGNOSIS — Z9049 Acquired absence of other specified parts of digestive tract: Secondary | ICD-10-CM | POA: Diagnosis not present

## 2020-10-19 DIAGNOSIS — I639 Cerebral infarction, unspecified: Secondary | ICD-10-CM

## 2020-10-19 DIAGNOSIS — W010XXA Fall on same level from slipping, tripping and stumbling without subsequent striking against object, initial encounter: Secondary | ICD-10-CM | POA: Diagnosis present

## 2020-10-19 DIAGNOSIS — I2510 Atherosclerotic heart disease of native coronary artery without angina pectoris: Secondary | ICD-10-CM | POA: Diagnosis present

## 2020-10-19 DIAGNOSIS — K589 Irritable bowel syndrome without diarrhea: Secondary | ICD-10-CM | POA: Diagnosis present

## 2020-10-19 DIAGNOSIS — D649 Anemia, unspecified: Secondary | ICD-10-CM | POA: Diagnosis not present

## 2020-10-19 DIAGNOSIS — F419 Anxiety disorder, unspecified: Secondary | ICD-10-CM | POA: Diagnosis present

## 2020-10-19 DIAGNOSIS — Z833 Family history of diabetes mellitus: Secondary | ICD-10-CM | POA: Diagnosis not present

## 2020-10-19 DIAGNOSIS — Z7982 Long term (current) use of aspirin: Secondary | ICD-10-CM | POA: Diagnosis not present

## 2020-10-19 DIAGNOSIS — I69398 Other sequelae of cerebral infarction: Secondary | ICD-10-CM | POA: Diagnosis not present

## 2020-10-19 DIAGNOSIS — I6349 Cerebral infarction due to embolism of other cerebral artery: Secondary | ICD-10-CM | POA: Diagnosis not present

## 2020-10-19 DIAGNOSIS — I6340 Cerebral infarction due to embolism of unspecified cerebral artery: Principal | ICD-10-CM

## 2020-10-19 DIAGNOSIS — G4700 Insomnia, unspecified: Secondary | ICD-10-CM | POA: Diagnosis present

## 2020-10-19 DIAGNOSIS — I110 Hypertensive heart disease with heart failure: Secondary | ICD-10-CM | POA: Diagnosis present

## 2020-10-19 DIAGNOSIS — Z85038 Personal history of other malignant neoplasm of large intestine: Secondary | ICD-10-CM | POA: Diagnosis not present

## 2020-10-19 DIAGNOSIS — E669 Obesity, unspecified: Secondary | ICD-10-CM

## 2020-10-19 DIAGNOSIS — E1169 Type 2 diabetes mellitus with other specified complication: Secondary | ICD-10-CM | POA: Diagnosis not present

## 2020-10-19 DIAGNOSIS — I252 Old myocardial infarction: Secondary | ICD-10-CM | POA: Diagnosis not present

## 2020-10-19 LAB — RAPID URINE DRUG SCREEN, HOSP PERFORMED
Amphetamines: NOT DETECTED
Barbiturates: NOT DETECTED
Benzodiazepines: POSITIVE — AB
Cocaine: NOT DETECTED
Opiates: NOT DETECTED
Tetrahydrocannabinol: NOT DETECTED

## 2020-10-19 LAB — LIPID PANEL
Cholesterol: 196 mg/dL (ref 0–200)
HDL: 27 mg/dL — ABNORMAL LOW (ref >40)
LDL Cholesterol: 118 mg/dL — ABNORMAL HIGH (ref 0–99)
Total CHOL/HDL Ratio: 7.3 RATIO
Triglycerides: 254 mg/dL — ABNORMAL HIGH (ref <150)
VLDL: 51 mg/dL — ABNORMAL HIGH (ref 0–40)

## 2020-10-19 LAB — GLUCOSE, CAPILLARY
Glucose-Capillary: 270 mg/dL — ABNORMAL HIGH (ref 70–99)
Glucose-Capillary: 198 mg/dL — ABNORMAL HIGH (ref 70–99)
Glucose-Capillary: 145 mg/dL — ABNORMAL HIGH (ref 70–99)
Glucose-Capillary: 223 mg/dL — ABNORMAL HIGH (ref 70–99)
Glucose-Capillary: 169 mg/dL — ABNORMAL HIGH (ref 70–99)
Glucose-Capillary: 195 mg/dL — ABNORMAL HIGH (ref 70–99)
Glucose-Capillary: 219 mg/dL — ABNORMAL HIGH (ref 70–99)

## 2020-10-19 LAB — HEMOGLOBIN A1C
Hgb A1c MFr Bld: 8.7 % — ABNORMAL HIGH (ref 4.8–5.6)
Mean Plasma Glucose: 202.99 mg/dL

## 2020-10-19 LAB — RESP PANEL BY RT-PCR (FLU A&B, COVID) ARPGX2
Influenza A by PCR: NEGATIVE
Influenza B by PCR: NEGATIVE
SARS Coronavirus 2 by RT PCR: NEGATIVE

## 2020-10-19 LAB — ECHOCARDIOGRAM COMPLETE
Area-P 1/2: 3.91 cm2
S' Lateral: 3.3 cm

## 2020-10-19 LAB — TROPONIN I (HIGH SENSITIVITY)
Troponin I (High Sensitivity): 9 ng/L (ref <18)
Troponin I (High Sensitivity): 9 ng/L (ref <18)

## 2020-10-19 LAB — HIV ANTIBODY (ROUTINE TESTING W REFLEX): HIV Screen 4th Generation wRfx: NONREACTIVE

## 2020-10-19 MED ORDER — IOHEXOL 350 MG/ML SOLN
80.0000 mL | Freq: Once | INTRAVENOUS | Status: AC | PRN
  Administered 2020-10-19: 80 mL via INTRAVENOUS

## 2020-10-19 MED ORDER — ROSUVASTATIN CALCIUM 5 MG PO TABS
10.0000 mg | ORAL_TABLET | Freq: Every day | ORAL | Status: DC
  Filled 2020-10-19: qty 2

## 2020-10-19 MED ORDER — PRAVASTATIN SODIUM 40 MG PO TABS
20.0000 mg | ORAL_TABLET | Freq: Every day | ORAL | Status: DC
  Administered 2020-10-19 – 2020-10-20 (×2): 20 mg via ORAL
  Filled 2020-10-19 (×2): qty 1

## 2020-10-19 MED ORDER — INSULIN GLARGINE 100 UNIT/ML ~~LOC~~ SOLN
40.0000 [IU] | Freq: Every day | SUBCUTANEOUS | Status: DC
  Administered 2020-10-19 – 2020-10-21 (×3): 40 [IU] via SUBCUTANEOUS
  Filled 2020-10-19 (×3): qty 0.4

## 2020-10-19 MED ORDER — SODIUM CHLORIDE 0.9 % IV SOLN
INTRAVENOUS | Status: DC
Start: 2020-10-19 — End: 2020-10-20

## 2020-10-19 MED ORDER — TICAGRELOR 90 MG PO TABS
90.0000 mg | ORAL_TABLET | Freq: Two times a day (BID) | ORAL | Status: DC
  Administered 2020-10-19 – 2020-10-21 (×5): 90 mg via ORAL
  Filled 2020-10-19 (×5): qty 1

## 2020-10-19 MED ORDER — MELATONIN 3 MG PO TABS
3.0000 mg | ORAL_TABLET | Freq: Once | ORAL | Status: AC
  Administered 2020-10-19: 3 mg via ORAL
  Filled 2020-10-19: qty 1

## 2020-10-19 MED ORDER — ACETAMINOPHEN 325 MG PO TABS
650.0000 mg | ORAL_TABLET | ORAL | Status: DC | PRN
  Filled 2020-10-19: qty 2

## 2020-10-19 MED ORDER — ACETAMINOPHEN 650 MG RE SUPP: 650.0000 mg | RECTAL | Status: DC | PRN

## 2020-10-19 MED ORDER — ACETAMINOPHEN 160 MG/5ML PO SOLN: 650.0000 mg | ORAL | Status: DC | PRN

## 2020-10-19 MED ORDER — ASPIRIN EC 81 MG PO TBEC
81.0000 mg | DELAYED_RELEASE_TABLET | Freq: Every day | ORAL | Status: DC
  Administered 2020-10-19 – 2020-10-21 (×3): 81 mg via ORAL
  Filled 2020-10-19 (×3): qty 1

## 2020-10-19 MED ORDER — DIAZEPAM 5 MG PO TABS
5.0000 mg | ORAL_TABLET | Freq: Every day | ORAL | Status: DC | PRN
  Administered 2020-10-19: 5 mg via ORAL
  Filled 2020-10-19: qty 1

## 2020-10-19 MED ORDER — STROKE: EARLY STAGES OF RECOVERY BOOK: Freq: Once | Status: AC

## 2020-10-19 MED ORDER — PERFLUTREN LIPID MICROSPHERE
1.0000 mL | INTRAVENOUS | Status: AC | PRN
  Administered 2020-10-19: 2 mL via INTRAVENOUS
  Filled 2020-10-19: qty 10

## 2020-10-19 MED ORDER — FOLIC ACID 1 MG PO TABS
1.0000 mg | ORAL_TABLET | Freq: Every day | ORAL | Status: DC
  Administered 2020-10-19 – 2020-10-21 (×3): 1 mg via ORAL
  Filled 2020-10-19 (×3): qty 1

## 2020-10-19 MED ORDER — INSULIN ASPART 100 UNIT/ML ~~LOC~~ SOLN
0.0000 [IU] | SUBCUTANEOUS | Status: DC
  Administered 2020-10-19: 2 [IU] via SUBCUTANEOUS
  Administered 2020-10-19: 3 [IU] via SUBCUTANEOUS
  Administered 2020-10-19: 2 [IU] via SUBCUTANEOUS
  Administered 2020-10-19: 1 [IU] via SUBCUTANEOUS
  Administered 2020-10-19: 3 [IU] via SUBCUTANEOUS
  Administered 2020-10-19 – 2020-10-20 (×2): 2 [IU] via SUBCUTANEOUS
  Administered 2020-10-20: 3 [IU] via SUBCUTANEOUS
  Administered 2020-10-20: 5 [IU] via SUBCUTANEOUS
  Administered 2020-10-20: 1 [IU] via SUBCUTANEOUS
  Administered 2020-10-20: 2 [IU] via SUBCUTANEOUS
  Administered 2020-10-21 (×2): 1 [IU] via SUBCUTANEOUS
  Administered 2020-10-21: 3 [IU] via SUBCUTANEOUS
  Administered 2020-10-21: 1 [IU] via SUBCUTANEOUS

## 2020-10-19 NOTE — Progress Notes (Signed)
Inpatient Rehab Admissions Coordinator:  Consult received. Note pt is under observation status at this time. Pt may not have the medical necessity to warrant an inpatient rehab stay if they remain observation. If status were to change to inpatient, AC will assess for candidacy.   Carles Florea Graves Madden, MS, CCC-SLP Admissions Coordinator [phone]  

## 2020-10-19 NOTE — Progress Notes (Signed)
VASCULAR LAB    Bilateral lower extremity venous duplex has been performed.  See CV proc for preliminary results.   Neema Barreira, RVT 10/19/2020, 12:33 PM

## 2020-10-19 NOTE — H&P (Signed)
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 as follows:  Acute CVA  Need for IV fluids,    Level of care    tele    indefinitely please discontinue once patient no longer qualifies COVID-19 Labs     Precautions: admitted as asymptomatic screening protocol  Airborne and Contact precautions    PPE: Used by the provider:   N95  eye Goggles,  Gloves     [name] 10/19/2020, 1:47 AM     [facility]     after 2 AM please page floor coverage PA If 7AM-7PM, please contact the day team taking care of the patient using [email or web address]   Patient was evaluated  in the context of the global COVID-19 pandemic, which necessitated consideration that the  patient might be at risk for infection with the SARS-CoV-2 virus that causes COVID-19. Institutional protocols and algorithms that pertain to the evaluation of patients at risk for COVID-19 are in a state of rapid change based on information released by regulatory bodies including the CDC and federal and state organizations. These policies and algorithms were followed during the patient's care.

## 2020-10-19 NOTE — Progress Notes (Signed)
  Echocardiogram 2D Echocardiogram has been performed.  Randa Lynn Adalie Mand 10/19/2020, 4:40 PM

## 2020-10-19 NOTE — Evaluation (Signed)
Physical Therapy Evaluation Patient Details Name: [name] MRN: [id number] DOB: February 16, 1961 Today's Date: 10/19/2020   History of Present Illness  60 y.o. male presenting with fall and possible syncope, balance disturbance, and nausea. 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 PMHx significant for CAD, STEMI s/p CAGB 2021, tobacco abuse, obesity, Hx of colon cancer, DMII, HTN, HLD, and systolic CHF.  Clinical Impression   Pt admitted with above diagnosis. Comes form home where he lives alone in a single level home; Independent at baseline, still working (tells me he used to work for [employer]); Presents to PT with uncoordinated movements, ataxic gait, increased fall risk; He is relatively young, tolerating therapies well, and motivated to get back to normal and back home -- and back to work when able; I believe with the intensive therapies at CIR he will be able to safely dc home at an overall modified independent level, and he tells me he has reliable neighbors and friends who can check in and assist as needed daily;  Pt currently with functional limitations due to the deficits listed below (see PT Problem List). Pt will benefit from skilled PT to increase their independence and safety with mobility to allow discharge to the venue listed below.       Follow Up Recommendations CIR    Equipment Recommendations  Rolling walker with 5" wheels;3in1 (PT)    Recommendations for Other Services       Precautions / Restrictions Precautions Precautions: Fall      Mobility  Bed Mobility Overal bed mobility: Needs Assistance Bed Mobility: Supine to Sit     Supine to sit: Min guard;HOB elevated     General bed mobility comments: Uncoordianted movement with Increased time/effort and heavy use of rail; Almost fell back on his back in the midst of getting up    Transfers Overall transfer level: Needs assistance Equipment used:  None;Rolling walker (2 wheeled) Transfers: Sit to/from Stand Sit to Stand: Min guard         General transfer comment: Min guard for sit to stand from EOB positioned in lowest setting with Min guard for steadying. Notably heavy use of UEs to steady/push off  Ambulation/Gait Ambulation/Gait assistance: Min assist;Min guard Gait Distance (Feet): 80 Feet (or more during hallway ambulation) Assistive device: None;Rolling walker (2 wheeled) Gait Pattern/deviations: Wide base of support;Decreased step length - right;Decreased step length - left;Decreased stride length Gait velocity: slowed   General Gait Details: Initial walking in room with min assist for balance and heavy dependence on UEs, reaching out fo rfurniture for support; Better balance with use of RW for hallway amb  Stairs            Wheelchair Mobility    Modified Rankin (Stroke Patients Only)       Balance     Sitting balance-Leahy Scale: Fair     Standing balance support: Bilateral upper extremity supported;No upper extremity supported;During functional activity Standing balance-Leahy Scale: Poor Standing balance comment: Reliant on at least unilateral UE support during dynamic standing tasks.                             Pertinent Vitals/Pain Pain Assessment: Faces Faces Pain Scale: Hurts a little bit Pain Location: Discomfort related ot the need to urinate Pain Descriptors / Indicators: Discomfort Pain Intervention(s): Monitored during session;Other (comment) (Relived with a visit to the bathroom)    Home Living Family/patient  expects to be discharged to:: Private residence Living Arrangements: Alone Available Help at Discharge: Friend(s);Available PRN/intermittently Landscape architect, close friends; can be checked on daily) Type of Home: House Home Access: Stairs to enter   [employer] of Steps: 1   Home Equipment: Clinical cytogeneticist - 2 wheels;Cane - quad      Prior Function  Level of Independence: Independent         Comments: I with ADLs/IADLs; driving; cooking/cleaning; working     Hand Dominance   Dominant Hand: Left    Extremity/Trunk Assessment   Upper Extremity Assessment Upper Extremity Assessment: Defer to OT evaluation (Notably decr armswing throughout gait)    Lower Extremity Assessment Lower Extremity Assessment: RLE deficits/detail RLE Deficits / Details: Notably dycoordinated heel to shin; weakness noted in hip flexors, and R knee flexors RLE Coordination: decreased gross motor       Communication   Communication: HOH  Cognition Arousal/Alertness: Awake/alert Behavior During Therapy: WFL for tasks assessed/performed;Anxious Overall Cognitive Status: Impaired/Different from baseline Area of Impairment: Problem solving                             Problem Solving: Slow processing General Comments: Noted increased time necessary to process verbal information.  Lots of questions re: dc planning; took extra time to discuss options; asked the same questions a few times throughout discussion      General Comments General comments (skin integrity, edema, etc.): Ex-fiance present at bedside; she reports she can check on pt daily    Exercises     Assessment/Plan    PT Assessment Patient needs continued PT services  PT Problem List Decreased strength;Decreased activity tolerance;Decreased balance;Decreased mobility;Decreased coordination;Decreased cognition;Decreased knowledge of use of DME;Decreased safety awareness;Decreased knowledge of precautions       PT Treatment Interventions DME instruction;Gait training;Stair training;Functional mobility training;Therapeutic activities;Therapeutic exercise;Balance training;Neuromuscular re-education;Cognitive remediation;Patient/family education    PT Goals (Current goals can be found in the Care Plan section)  Acute Rehab PT Goals Patient Stated Goal: To get better. Very much  wants to go to CIR for post-acute rehab PT Goal Formulation: With patient Time For Goal Achievement: 11/02/20 Potential to Achieve Goals: Good    Frequency Min 4X/week   Barriers to discharge        Co-evaluation               AM-PAC PT "6 Clicks" Mobility  Outcome Measure Help needed turning from your back to your side while in a flat bed without using bedrails?: A Little Help needed moving from lying on your back to sitting on the side of a flat bed without using bedrails?: A Little Help needed moving to and from a bed to a chair (including a wheelchair)?: A Little Help needed standing up from a chair using your arms (e.g., wheelchair or bedside chair)?: A Little Help needed to walk in hospital room?: A Lot Help needed climbing 3-5 steps with a railing? : A Lot 6 Click Score: 16    End of Session Equipment Utilized During Treatment: Gait belt Activity Tolerance: Patient tolerated treatment well Patient left: in chair;with chair alarm set Nurse Communication: Mobility status;Other (comment) (Pt is asking for more meds for anxiety) PT Visit Diagnosis: Unsteadiness on feet (R26.81);Other abnormalities of gait and mobility (R26.89);Muscle weakness (generalized) (M62.81);Hemiplegia and hemiparesis Hemiplegia - Right/Left: Right Hemiplegia - dominant/non-dominant: Non-dominant Hemiplegia - caused by: Cerebral infarction    Time: 1624-1700 PT Time Calculation (min) (ACUTE  ONLY): 36 min   Charges:   PT Evaluation $PT Eval Moderate Complexity: 1 Mod PT Treatments $Gait Training: 8-22 mins        Roney [place], [place]  Acute Rehabilitation Services Pager [phone] Office [phone]   Colletta [place] 10/19/2020, 7:31 PM

## 2020-10-19 NOTE — Consult Note (Signed)
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 CT HEAD IMPRESSION: 1. Continued interval evolution of previously identified multifocal ischemic infarcts, stable and better evaluated on recent brain MRI. No associated hemorrhage or significant regional mass effect. 2. No other new acute intracranial abnormality. CTA HEAD AND NECK IMPRESSION: 1. Negative CTA for large vessel occlusion. 2. Short-segment moderate to severe left P2 stenosis. 3. 50% atheromatous stenosis about the left carotid bulb/proximal left ICA. 4. Additional mild for age atheromatous change elsewhere about the major arterial vasculature of the head and neck. No other hemodynamically significant or correctable stenosis. Electronically Signed   By: BJeannine BogaM.D.   On: 10/19/2020 03:45   MR BRAIN WO CONTRAST  Result Date: 10/18/2020 CLINICAL DATA:  Initial evaluation for acute stroke. EXAM: MRI HEAD WITHOUT CONTRAST TECHNIQUE: Multiplanar,  multiecho pulse sequences of the brain and surrounding structures were obtained without intravenous contrast. COMPARISON:  Prior CT from earlier the same day. FINDINGS:  Brain: Cerebral volume within normal limits for age. Minimal T2/FLAIR hyperintensity seen involving the periventricular and deep white matter of both cerebral hemispheres most like related chronic microvascular ischemic disease, mild for age. 3.4 cm focus of restricted diffusion involving the posterior left thalamus/periventricular white matter is seen, consistent with an acute ischemic infarct (series 2, image 90). Multiple additional scattered subcentimeter cortical and subcortical ischemic infarcts seen involving the bilateral parieto-occipital regions (series 2, images 95, 84, 81). Few additional scattered subcentimeter ischemic infarcts seen involving the right cerebellum (series 2, images 75, 74, 73). No associated hemorrhage or mass effect. Findings are likely embolic in nature. Gray-white matter differentiation otherwise maintained. No other areas of chronic cortical infarction. No other foci of susceptibility artifact to suggest acute or chronic intracranial hemorrhage. No mass lesion, midline shift or mass effect. No hydrocephalus or extra-axial fluid collection. Pituitary gland suprasellar region within normal limits. Midline structures intact. Vascular: Major intracranial vascular flow voids are maintained. Skull and upper cervical spine: Craniocervical junction within normal limits. Bone marrow signal intensity normal. No focal marrow replacing lesion. No scalp soft tissue abnormality. Sinuses/Orbits: Globes and orbital soft tissues within normal limits. Mild scattered mucosal thickening noted within the ethmoidal air cells and maxillary sinuses. Paranasal sinuses are otherwise clear. No significant mastoid effusion. Inner ear structures grossly normal. Other: None. IMPRESSION: 1. 3.4 cm acute ischemic nonhemorrhagic infarct involving the posterior left thalamus/periventricular white matter. 2. Additional scattered subcentimeter cortical and subcortical ischemic infarcts involving the bilateral  parieto-occipital regions and right cerebellum. Findings are likely embolic in nature. 3. Underlying mild chronic microvascular ischemic disease. Electronically Signed   By: Jeannine Boga M.D.   On: 10/18/2020 20:55   DG Chest Port 1 View  Result Date: 10/19/2020 CLINICAL DATA:  60 year old male with left thalamic and other small scattered cerebral infarcts. EXAM: PORTABLE CHEST 1 VIEW COMPARISON:  Chest radiographs 11/05/2019 and earlier. CTA neck 0310 hours today. FINDINGS: Portable AP upright view at 1027 hours. Lung volumes and mediastinal contours remain normal. Visualized tracheal air column is within normal limits. Allowing for portable technique the lungs are clear. No pneumothorax or pleural effusion. Paucity of bowel gas in the upper abdomen. No acute osseous abnormality identified. IMPRESSION: Negative portable chest. Electronically Signed   By: Genevie Ann M.D.   On: 10/19/2020 10:41   VAS [place] LOWER EXTREMITY VENOUS (DVT)  Result Date: 10/19/2020  Lower Venous DVT Study Indications: Stroke.  Comparison Study: No prior study on file Performing Technologist: Sharion Dove RVS  Examination Guidelines: A complete evaluation includes B-mode imaging, spectral Doppler, color Doppler, and power Doppler as needed of all accessible portions of each vessel. Bilateral testing is considered an integral part of a complete examination. Limited examinations for reoccurring indications may be performed as noted. The reflux portion of the exam is performed with the patient in reverse Trendelenburg.  +---------+---------------+---------+-----------+----------+--------------+ RIGHT    CompressibilityPhasicitySpontaneityPropertiesThrombus Aging +---------+---------------+---------+-----------+----------+--------------+ CFV      Full           Yes      Yes                                 +---------+---------------+---------+-----------+----------+--------------+ SFJ      Full                                                         +---------+---------------+---------+-----------+----------+--------------+  FV Prox  Full                                                        +---------+---------------+---------+-----------+----------+--------------+ FV Mid   Full                                                        +---------+---------------+---------+-----------+----------+--------------+ FV DistalFull                                                        +---------+---------------+---------+-----------+----------+--------------+ PFV      Full                                                        +---------+---------------+---------+-----------+----------+--------------+ POP      Full           Yes      Yes                                 +---------+---------------+---------+-----------+----------+--------------+ PTV      Full                                                        +---------+---------------+---------+-----------+----------+--------------+ PERO     Full                                                        +---------+---------------+---------+-----------+----------+--------------+   +---------+---------------+---------+-----------+----------+--------------+ LEFT     CompressibilityPhasicitySpontaneityPropertiesThrombus Aging +---------+---------------+---------+-----------+----------+--------------+ CFV      Full           Yes      Yes                                 +---------+---------------+---------+-----------+----------+--------------+ SFJ      Full                                                        +---------+---------------+---------+-----------+----------+--------------+ FV Prox  Full                                                        +---------+---------------+---------+-----------+----------+--------------+  FV Mid   Full                                                         +---------+---------------+---------+-----------+----------+--------------+ FV DistalFull                                                        +---------+---------------+---------+-----------+----------+--------------+ PFV      Full                                                        +---------+---------------+---------+-----------+----------+--------------+ POP      Full           Yes      Yes                                 +---------+---------------+---------+-----------+----------+--------------+ PTV      Full                                                        +---------+---------------+---------+-----------+----------+--------------+ PERO     Full                                                        +---------+---------------+---------+-----------+----------+--------------+     Summary: BILATERAL: - No evidence of deep vein thrombosis seen in the lower extremities, bilaterally. -   *See table(s) above for measurements and observations.    Preliminary      Assessment/Plan: Diagnosis: Right cerebellar and left thalamic infarct with reduced balance and mobility as well as ADLs 1. Does the need for close, 24 hr/day medical supervision in concert with the patient's rehab needs make it unreasonable for this patient to be served in a less intensive setting? Yes 2. Co-Morbidities requiring supervision/potential complications: Coronary artery disease, diabetes 3. Due to bladder management, bowel management, safety, skin/wound care, disease management, medication administration, pain management and patient education, does the patient require 24 hr/day rehab nursing? Yes 4. Does the patient require coordinated care of a physician, rehab nurse, therapy disciplines of PT, OT to address physical and functional deficits in the context of the above medical diagnosis(es)? Yes Addressing deficits in the following areas: balance, endurance, locomotion, strength,  transferring, bowel/bladder control, bathing, dressing, toileting and psychosocial support 5. Can the patient actively participate in an intensive therapy program of at least 3 hrs of therapy per day at least 5 days per week? Yes 6. The potential for patient to make measurable gains while on inpatient rehab is excellent 7. Anticipated functional outcomes upon discharge from inpatient rehab are modified independent  with PT, modified independent  with OT, n/a with SLP. 8. Estimated rehab length of stay to reach the above functional goals is: 7 days 9. Anticipated discharge destination: Home 10. Overall Rehab/Functional Prognosis: excellent  RECOMMENDATIONS: This patient's condition is appropriate for continued rehabilitative care in the following setting: CIR Patient has agreed to participate in recommended program. Yes Note that insurance prior authorization may be required for reimbursement for recommended care.  Comment: Reportedly lives alone was working prior to admission   Cathlyn Parsons, PA-C 10/19/2020

## 2020-10-19 NOTE — Evaluation (Signed)
Occupational Therapy Evaluation Patient Details Name: [name] MRN: [id number] DOB: 1960-12-21 Today's Date: 10/19/2020    History of Present Illness 60 y.o. male presenting with fall and possible syncope, balance disturbance, and nausea. 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 PMHx significant for CAD, STEMI s/p CAGB 2021, tobacco abuse, obesity, Hx of colon cancer, DMII, HTN, HLD, and systolic CHF.   Clinical Impression   PTA patient was living alone in a private residence with his cat and was grossly I with ADLs/IADLs without AD. Patient currently functioning below baseline demonstrating observed ADLs including LB dressing and ADL transfers with Min guard to Min A without AD. Patient also limited by deficits listed below including mild RUE hemiparesis, decreased static/dynamic standing balance, and decreased strength and would benefit from continued acute OT services in prep for safe d/c to next level of care. Patient would benefit from intensive CIR but reports no supervision/assist post d/c. OT will continue to follow acutely.      Follow Up Recommendations  CIR    Equipment Recommendations  Other (comment) (rolling walker)    Recommendations for Other Services Rehab consult     Precautions / Restrictions Precautions Precautions: Fall Restrictions Weight Bearing Restrictions: No      Mobility Bed Mobility Overal bed mobility: Needs Assistance Bed Mobility: Supine to Sit     Supine to sit: Min guard;HOB elevated     General bed mobility comments: Increased time/effort and heavy use of rail.    Transfers Overall transfer level: Needs assistance Equipment used: None;Rolling walker (2 wheeled) Transfers: Sit to/from [facility] Sit to Stand: Min guard Stand pivot transfers: Min guard       General transfer comment: Min guard for sit to stand from EOB positioned in lowest setting with Min  guard for steadying. Min guard for stand-pivot to recliner.    Balance Overall balance assessment: Needs assistance Sitting-balance support: No upper extremity supported;Single extremity supported;Feet supported Sitting balance-Leahy Scale: Fair     Standing balance support: Bilateral upper extremity supported;No upper extremity supported;During functional activity Standing balance-Leahy Scale: Poor Standing balance comment: Reliant on at least unilateral UE support during dynamic standing tasks.                           ADL either performed or assessed with clinical judgement   ADL Overall ADL's : Needs assistance/impaired     Grooming: Min guard;Standing Grooming Details (indicate cue type and reason): Handwashing standing at sink level with Min guard. No AD.             Lower Body Dressing: Minimal assistance Lower Body Dressing Details (indicate cue type and reason): Min A to don footwear seated EOB. Toilet Transfer: Minimal Print production planner Details (indicate cue type and reason): Simulated with transfer to recliner without AD.         Functional mobility during ADLs: Minimal assistance General ADL Comments: Patient limited by mild R hemi, balance deficits, and need for Min guard to Min A overall for observed ADL tasks.     Vision Baseline Vision/History: Wears glasses Wears Glasses: At all times Patient Visual Report: No change from baseline Vision Assessment?: Yes Eye Alignment: Within Functional Limits Ocular Range of Motion: Within Functional Limits Alignment/Gaze Preference: Within Defined Limits Tracking/Visual Pursuits: Able to track stimulus in all quads without difficulty     Perception     Praxis  Pertinent Vitals/Pain Pain Assessment: No/denies pain     Hand Dominance Left   Extremity/Trunk Assessment Upper Extremity Assessment Upper Extremity Assessment: RUE deficits/detail RUE Deficits / Details: MMT grossly  4-/5. RUE Sensation: WNL RUE Coordination: decreased fine motor   Lower Extremity Assessment Lower Extremity Assessment: Defer to PT evaluation   Cervical / Trunk Assessment Cervical / Trunk Assessment: Normal   Communication Communication Communication: HOH   Cognition Arousal/Alertness: Awake/alert Behavior During Therapy: Agitated Overall Cognitive Status: Impaired/Different from baseline Area of Impairment: Problem solving                             Problem Solving: Slow processing General Comments: Patient very agitated throughout evaluation. Noted increased time necessary to process verbal information.   General Comments  Ex-fiance present at bedside.    Exercises     Shoulder Instructions      Home Living Family/patient expects to be discharged to:: Private residence Living Arrangements: Alone Available Help at Discharge: Other (Comment) (None) Type of Home: House Home Access: Stairs to enter [employer] of Steps: 1         Bathroom Shower/Tub: Occupational psychologist: Standard     Home Equipment: Clinical cytogeneticist - 2 wheels;Cane - quad          Prior Functioning/Environment Level of Independence: Independent        Comments: I with ADLs/IADLs; driving; cooking/cleaning        OT Problem List: Decreased strength;Decreased activity tolerance;Impaired balance (sitting and/or standing);Decreased coordination;Decreased cognition;Decreased knowledge of use of DME or AE;Impaired UE functional use      OT Treatment/Interventions: Self-care/ADL training;Therapeutic exercise;Neuromuscular education;DME and/or AE instruction;Therapeutic activities;Cognitive remediation/compensation;Patient/family education;Balance training    OT Goals(Current goals can be found in the care plan section) Acute Rehab OT Goals Patient Stated Goal: To get better. OT Goal Formulation: With patient Time For Goal Achievement:  11/02/20 Potential to Achieve Goals: Good ADL Goals Additional ADL Goal #1: Patient will complete a.m. ADLs with Mod I and LRAD. Additional ADL Goal #2: Patient will return demo RUE HEP with written handout to improve RUE strength/coordination in prep for ADLs/IADLs. Additional ADL Goal #3: Patient will complete ADL transfers with Mod I and LRAD demonstrating good safety awareness.  OT Frequency: Min 3X/week   Barriers to D/C: Decreased caregiver support  Lives alone.       Co-evaluation              AM-PAC OT "6 Clicks" Daily Activity     Outcome Measure Help from another person eating meals?: None Help from another person taking care of personal grooming?: A Little Help from another person toileting, which includes using toliet, bedpan, or urinal?: A Little Help from another person bathing (including washing, rinsing, drying)?: A Lot Help from another person to put on and taking off regular upper body clothing?: A Little Help from another person to put on and taking off regular lower body clothing?: A Little 6 Click Score: 18   End of Session Equipment Utilized During Treatment: Gait belt;Rolling walker Nurse Communication: Mobility status  Activity Tolerance: Patient tolerated treatment well Patient left: in chair;with call bell/phone within reach;with chair alarm set  OT Visit Diagnosis: Unsteadiness on feet (R26.81);Muscle weakness (generalized) (M62.81);Hemiplegia and hemiparesis Hemiplegia - Right/Left: Right Hemiplegia - dominant/non-dominant: Non-Dominant Hemiplegia - caused by: Cerebral infarction                Time:  2111-7356 OT Time Calculation (min): 29 min Charges:  OT General Charges $OT Visit: 1 Visit OT Evaluation $OT Eval Moderate Complexity: 1 Mod OT Treatments $Self Care/Home Management : 8-22 mins  Marny Smethers H. OTR/L Supplemental OT, Department of rehab services [phone]  Lynnex Fulp R H. 10/19/2020, 10:48 AM

## 2020-10-19 NOTE — Consult Note (Signed)
Neurology Consultation Reason for Consult: Stroke Referring Physician: Roel Cluck, a  CC: Balance difficulty  History is obtained from: Patient  HPI: [name] is a 60 y.o. male he states that he has had balance difficulty since Thursday or Friday.  He has fallen.  The balance difficulty came first with the falls happening after the fact.  He denies any double vision, numbness, weakness, or other symptoms.  Due to the symptoms being persistent he sought care at [facility] which has an MRI truck available on Saturdays.  An MRI was performed which demonstrates multifocal embolic appearing strokes in the posterior circulation including the right cerebellum and left thalamus/periventricular white matter.   LKW: Thursday or Friday tpa given?: no, out of window    ROS: A 14 point ROS was performed and is negative except as noted in the HPI.   Past Medical History:  Diagnosis Date  . Anemia 10/03/2009   Qualifier: Diagnosis of  By: [name]   . Anxiety associated with depression 10/11/2013  . Colon cancer ([place])    right colon cancer- adenocarcinoma CEA level isnrmal at 1.8  . Diabetes mellitus   . Diabetes mellitus type 2 in obese [facility]) 03/20/2010   Qualifier: Diagnosis of  By: [name]    . Erectile dysfunction 06/22/2016  . Essential hypertension 06/05/2014  . FATTY LIVER DISEASE 11/04/2009   Qualifier: Diagnosis of  By: [name] MD, Docia Chuck   Qualifier: Diagnosis of  By: [name] MD, Docia Chuck  Last Assessment & Plan:  conirmed by CT scan of abdomen in April 2016 encouraged to minimize simple carbs and fatty foods.  Marland Kitchen GERD 11/04/2009   Qualifier: Diagnosis of  By: [name] MD, Docia Chuck   . Great toe pain, right 04/18/2017  . Hepatic artery stenosis ([place])   . History of colon cancer 01/02/2010   Qualifier: Diagnosis of  By: [name] Dr [name]  Last Assessment & Plan:  Follows closely with gastroenterology, Dr [name]  . Hyperlipidemia   . Hyperlipidemia, mixed  09/07/2010   Qualifier: Diagnosis of  By: [name]   . Hypertension   . Hypertriglyceridemia 12/14/2010  . Hypotestosteronism 06/22/2016  . INSOMNIA, CHRONIC 03/20/2010   Qualifier: Diagnosis of  By: [name] Ambien 10 mg daily does not keep asleep AdvilPM is over sedating and has trouble waking up   . Iron deficiency anemia 2011  . Low testosterone 06/22/2016  . Malignant neoplasm of colon ([place]) 01/02/2010   Qualifier: Diagnosis of  By: [name] Dr [name]   . Muscle spasm of back 06/05/2014  . Nipple pain 09/30/2016  . Obesity   . Palpitations 06/05/2014  . Preventative health care 10/08/2012  . Psoriasis   . Psoriatic arthritis ([place]) 08/25/2017  . Right knee pain 11/17/2011     Family History  Problem Relation Age of Onset  . Stomach cancer Mother        diedinher 66's  . Diabetes type II Brother        boderline  . Diabetes Brother        type II  . Alcohol abuse Brother   . Other Neg Hx        cad,prostate ca, colon ca  . Coronary artery disease Neg Hx   . Cancer Neg Hx        colon, prostate  . Colon cancer Neg Hx   . Esophageal cancer Neg Hx   .  Rectal cancer Neg Hx      Social History:  reports that he quit smoking about a year ago. His smoking use included cigarettes. He has never used smokeless tobacco. He reports that he does not drink alcohol and does not use drugs.   Exam: Current vital signs: BP 128/80 (BP Location: Right Arm)   Pulse 87   Temp 98.4 F (36.9 C) (Oral)   Resp 18   SpO2 100%  Vital signs in last 24 hours: Temp:  [98.4 F (36.9 C)-98.8 F (37.1 C)] 98.4 F (36.9 C) (04/03 0042) Pulse Rate:  [86-92] 87 (04/03 0042) Resp:  [12-18] 18 (04/03 0042) BP: (116-143)/(56-84) 128/80 (04/03 0042) SpO2:  [96 %-100 %] 100 % (04/03 0042)   Physical Exam  Constitutional: Appears well-developed and well-nourished.  Psych: Affect appropriate to situation Eyes: No scleral injection HENT: No OP obstruction MSK: no joint  deformities.  Cardiovascular: Normal rate and regular rhythm.  Respiratory: Effort normal, non-labored breathing GI: Soft.  No distension. There is no tenderness.  Skin: WDI  Neuro: Mental Status: Patient is awake, alert, oriented to person, place, month, year, and situation. Patient is able to give a clear and coherent history. No signs of aphasia or neglect Cranial Nerves: II: Visual Fields are full. Pupils are reactive bilaterally, right pupil is very slightly larger than left. III,IV, VI: EOMI without ptosis or diploplia.  V: Facial sensation is symmetric to temperature VII: Facial movement is symmetric.  VIII: hearing is intact to voice X: Uvula elevates symmetrically XI: Shoulder shrug is symmetric. XII: tongue is midline without atrophy or fasciculations.  Motor: Tone is normal. Bulk is normal. 5/5 strength was present on the left side, on the right he has mild right pronator drift and discoordination of the right arm.  5 -/5 Sensory: Sensation is symmetric to light touch and temperature in the arms and legs. Cerebellar: FNF and HKS are intact on the left, finger-nose-finger on the right is mildly impaired.   I have reviewed labs in epic and the results pertinent to this consultation are: Creatinine 0.8  I have reviewed the images obtained: MRI brain-embolic appearing posterior circulation strokes  Impression: 60 year old male with a history of hypertension, hyperlipidemia, hypertriglyceridemia, diabetes likely embolic posterior circulation stroke.  This could be cardioembolic or artery to artery.  He will need admission for physical therapy and secondary risk factor work-up.  Recommendations: - HgbA1c, fasting lipid panel - MRI, MRA  of the brain without contrast - Frequent neuro checks - Echocardiogram - Prophylactic therapy-continue home aspirin and Brilinta - Risk factor modification - Telemetry monitoring - PT consult, OT consult, Speech consult - Stroke team  to follow    Roland Rack, MD [facility] [phone]  If 7pm- 7am, please page neurology on call as listed in [place].

## 2020-10-19 NOTE — Progress Notes (Signed)
Late entry-patient seen and examined 10/19/20 discussed fully plan of care with Dr. Erlinda Hong  Seen and examined and agree with my partner who admitted this patient this morning Essentially 60 year old BMI 33 white male stent 2021 HTN HLD OSA?  HFpEF + systolic EF 33-29% psoriatic arthritis/methotrexate-colon cancer status post resection 2011 Patient not compliant on HTN meds/psoriatic meds Admitted with acute ischemic CVA Stroke team consulted  Vitals:   10/19/20 0835 10/19/20 1216  BP: 114/71 120/66  Pulse: 80 96  Resp: 16 18  Temp: 98.1 F (36.7 C) 97.9 F (36.6 C)  SpO2: 99% 99%   Alert EOMI NCAT no focal deficit to motor however past-pointing on the right side no dysdiadochokinesia no field cut Chest clear S1-S2 do not appreciate murmur  P Patient on stroke pathway await further discussion from neurologist Follow hypercoagulable panel Follow rapid Covid Anticipate discharge planning in 1 to 2 days as might need TEE loop if can be arranged expediently (discussed with Dr.Xu who confirms he has ordered the same)  Verneita Griffes, MD [facility] 3:30 PM

## 2020-10-19 NOTE — Progress Notes (Signed)
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                                                        +---------+---------------+---------+-----------+----------+--------------+ PERO     Full                                                        +---------+---------------+---------+-----------+----------+--------------+     Summary: BILATERAL: - No evidence of deep vein thrombosis seen in the lower extremities, bilaterally. -   *See table(s) above for measurements and observations. Electronically signed by Monica Martinez MD on 10/19/2020 at 4:23:09 PM.    Final     PHYSICAL EXAM  Temp:  [97.9 F (36.6 C)-99.5 F (37.5 C)] 99.5 F (37.5 C) (04/03 1600) Pulse Rate:  [80-96] 96 (04/03 1216) Resp:  [16-18] 18 (04/03 1600) BP: (114-143)/(56-85) 138/85 (04/03 1600) SpO2:  [96 %-100 %] 97 % (04/03 1600)  General - Well nourished, well developed, in no apparent distress.  Ophthalmologic - fundi not visualized due to noncooperation.  Cardiovascular - Regular rhythm and rate.  Mental Status -  Level of arousal and orientation to time, place, and person were intact. Language including expression,  naming, repetition, comprehension was assessed and found intact.  Cranial Nerves II - XII - II - Visual field intact OU. III, IV, VI - Extraocular movements intact. V - Facial sensation intact bilaterally. VII - Facial movement intact bilaterally. VIII - Hearing & vestibular intact bilaterally. X - Palate elevates symmetrically. XI - Chin turning & shoulder shrug intact bilaterally. XII - Tongue protrusion intact.  Motor Strength - The patient's strength was normal in all extremities and pronator drift was absent.  Bulk was normal and fasciculations were absent.   Motor Tone - Muscle tone was assessed at the neck and appendages and was normal.  Reflexes - The patient's reflexes were symmetrical in all extremities and he had no pathological reflexes.  Sensory - Light touch, temperature/pinprick were assessed and were symmetrical.    Coordination - The patient had normal movements in the left hand and foot with no ataxia or  dysmetria.  However mild right finger-to-nose dysmetria and heel-to-shin ataxia.  Tremor was absent.  Gait and Station - deferred.   ASSESSMENT/PLAN [name] is a 60 y.o. male with history of diabetes, hypertension, hyperlipidemia, obesity, colon cancer, CAD stent MI status post stenting 10/2019 admitted for imbalance and falling. No tPA given due to outside window.    Stroke: Left thalamic and posterior CR, bilateral occipital punctate, right cerebellum small infarcts, embolic pattern, secondary to unclear source  Resultant right sided impaired coordination  CT no acute abnormality  CTA head and neck left P2 severe stenosis, bilateral ICA bulb and siphon atherosclerosis  MRI left thalamic and left posterior CR infarcts, punctate bilateral occipital and right cerebellar infarcts.  2D Echo EF 55 to 60%  LE venous Doppler no DVT  Recommend TEE and loop recorder for cardioembolic work-up  LDL 818  HgbA1c 8.7  Hypercoagulable work-up  pending  SCDs for VTE prophylaxis  aspirin 81 mg daily and Brilinta (ticagrelor) 90 mg bid prior to admission, now on aspirin 81 mg daily and Brilinta (ticagrelor) 90 mg bid.  Continue on discharge.  Patient counseled to be compliant with his antithrombotic medications  Ongoing aggressive stroke risk factor management  Therapy recommendations: CIR  Disposition: Pending  Diabetes  HgbA1c 8.7 goal < 7.0  Uncontrolled  Currently on Lantus  CBG monitoring  SSI  DM education and close PCP follow up  Hypertension . Stable  Long term BP goal normotensive  Hyperlipidemia  Home meds: None, Lipitor and the Crestor causing muscle pain  LDL 118, goal < 70  Patient agreed to try lowering intensity statin - put on pravastatin 20  Continue statin at discharge  Other Stroke Risk Factors  CAD/MI 10/2019, on aspirin Brilinta PTA  Obesity  Other Active Problems  Colon cancer  Hospital day # 0   Rosalin Hawking, MD PhD Stroke Neurology 10/19/2020 6:50 PM    To contact Stroke Continuity provider, please refer to [email or web address] After hours, contact General Neurology

## 2020-10-20 DIAGNOSIS — I25119 Atherosclerotic heart disease of native coronary artery with unspecified angina pectoris: Secondary | ICD-10-CM

## 2020-10-20 DIAGNOSIS — I639 Cerebral infarction, unspecified: Secondary | ICD-10-CM

## 2020-10-20 DIAGNOSIS — R42 Dizziness and giddiness: Secondary | ICD-10-CM

## 2020-10-20 LAB — GLUCOSE, CAPILLARY
Glucose-Capillary: 151 mg/dL — ABNORMAL HIGH (ref 70–99)
Glucose-Capillary: 183 mg/dL — ABNORMAL HIGH (ref 70–99)
Glucose-Capillary: 156 mg/dL — ABNORMAL HIGH (ref 70–99)
Glucose-Capillary: 254 mg/dL — ABNORMAL HIGH (ref 70–99)
Glucose-Capillary: 173 mg/dL — ABNORMAL HIGH (ref 70–99)
Glucose-Capillary: 206 mg/dL — ABNORMAL HIGH (ref 70–99)

## 2020-10-20 LAB — VITAMIN B12: Vitamin B-12: 252 pg/mL (ref 180–914)

## 2020-10-20 LAB — SEDIMENTATION RATE: Sed Rate: 20 mm/hr — ABNORMAL HIGH (ref 0–16)

## 2020-10-20 LAB — SARS CORONAVIRUS 2 (TAT 6-24 HRS): SARS Coronavirus 2: NEGATIVE

## 2020-10-20 LAB — RPR: RPR Ser Ql: NONREACTIVE

## 2020-10-20 LAB — C-REACTIVE PROTEIN: CRP: <0.5 mg/dL (ref <1.0)

## 2020-10-20 LAB — TSH: TSH: 2.197 u[IU]/mL (ref 0.350–4.500)

## 2020-10-20 MED ORDER — LOSARTAN POTASSIUM 25 MG PO TABS
12.5000 mg | ORAL_TABLET | Freq: Every day | ORAL | Status: DC
  Administered 2020-10-20 – 2020-10-21 (×2): 12.5 mg via ORAL
  Filled 2020-10-20 (×2): qty 1

## 2020-10-20 NOTE — Progress Notes (Signed)
Physical Therapy Treatment Patient Details Name: [name] MRN: [id number] DOB: Feb 25, 1961 Today's Date: 10/20/2020    History of Present Illness 60 y.o. male presenting with fall and possible syncope, balance disturbance, and nausea. 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 PMHx significant for CAD, STEMI s/p CAGB 2021, tobacco abuse, obesity, Hx of colon cancer, DMII, HTN, HLD, and systolic CHF.    PT Comments    Pt progressing towards physical therapy goals. Continues to demonstrate balance, coordination, and cognitive deficits. Pt scored 11/24 on the DGI, indicating he is at a higher risk for falls. Overall pt is motivated to participate with therapy and is eager to d/c to CIR. Feel with a short bout of inpatient rehab, pt will be able to achieve a mod I level and decrease risk for falls upon return home. Will continue to follow and progress as able per POC.    Follow Up Recommendations  CIR     Equipment Recommendations  Rolling walker with 5" wheels;3in1 (PT)    Recommendations for Other Services       Precautions / Restrictions Precautions Precautions: Fall Restrictions Weight Bearing Restrictions: No    Mobility  Bed Mobility Overal bed mobility: Needs Assistance Bed Mobility: Supine to Sit2     Supine to sit: Supervision Sit to supine: Supervision;HOB elevated   General bed mobility comments: Increased time to transition to EOB. HOB flat and no use of rails to simulate home environment. Attempting to utilize momentum to get trunk elevated.    Transfers Overall transfer level: Needs assistance Equipment used: None;Rolling walker (2 wheeled) Transfers: Sit to/from Stand Sit to Stand: Supervision;Min guard;From elevated surface Stand pivot transfers: Min guard;Min assist       General transfer comment: Attempting to stand without UE use to "show" therapist he could do it. Very unsteady and without a  smooth transition to standing. Light min assist required.  Ambulation/Gait Ambulation/Gait assistance: Min assist;Min guard Gait Distance (Feet): 200 Feet Assistive device: None;Rolling walker (2 wheeled) Gait Pattern/deviations: Wide base of support;Decreased step length - right;Decreased step length - left;Decreased stride length Gait velocity: Decreased Gait velocity interpretation: <1.8 ft/sec, indicate of risk for recurrent falls General Gait Details: Wide BOS with ambulation without an AD. With RW, pt able to improve overall gait pattern but with occasional unsteadiness and need for assist, more so with higher level balance activity.   Stairs Stairs: Yes Stairs assistance: Min guard;Mod assist Stair Management: Two rails;No rails;Forwards Number of Stairs: 5 General stair comments: Attempted stairs without rails, again to "show" therapist he was able to do it, and required mod assist for balance support and safety. With 2 rails, min guard assist and increased time required.   Wheelchair Mobility    Modified Rankin (Stroke Patients Only)       Balance Overall balance assessment: Needs assistance Sitting-balance support: No upper extremity supported;Feet supported Sitting balance-Leahy Scale: Fair Sitting balance - Comments: sitting EOB for LB ADLs with no LOB   Standing balance support: No upper extremity supported;During functional activity Standing balance-Leahy Scale: Poor Standing balance comment: Dynamically                 Standardized Balance Assessment Standardized Balance Assessment : Dynamic Gait Index   Dynamic Gait Index Level Surface: Mild Impairment Change in Gait Speed: Mild Impairment Gait with Horizontal Head Turns: Moderate Impairment Gait with Vertical Head Turns: Mild Impairment Gait and Pivot Turn: Moderate Impairment Step Over Obstacle: Severe Impairment  Step Around Obstacles: Mild Impairment Steps: Moderate Impairment Total Score:  11      Cognition Arousal/Alertness: Awake/alert Behavior During Therapy: Restless Overall Cognitive Status: Impaired/Different from baseline Area of Impairment: Memory;Safety/judgement                     Memory: Decreased short-term memory   Safety/Judgement: Decreased awareness of safety;Decreased awareness of deficits   Problem Solving: Slow processing General Comments: Very inquisitive, however not remembering answers to his many questions.      Exercises Other Exercises Other Exercises: oppoistion RUE, finger spearding RUE, composite flexion/ extension in RUE    General Comments General comments (skin integrity, edema, etc.): HR 99 bpm post mobility, pt reports arthritis in R hand contributing to decreased grip strength in R hand      Pertinent Vitals/Pain Pain Assessment: No/denies pain Faces Pain Scale: Hurts a little bit Pain Location: discomfort related to having to lay in bed all night Pain Descriptors / Indicators: Discomfort Pain Intervention(s): Repositioned    Home Living                      Prior Function            PT Goals (current goals can now be found in the care plan section) Acute Rehab PT Goals Patient Stated Goal: to return to [facility]; return to work PT Goal Formulation: With patient Time For Goal Achievement: 11/02/20 Potential to Achieve Goals: Good Progress towards PT goals: Progressing toward goals    Frequency    Min 4X/week      PT Plan Current plan remains appropriate    Co-evaluation              AM-PAC PT "6 Clicks" Mobility   Outcome Measure  Help needed turning from your back to your side while in a flat bed without using bedrails?: A Little Help needed moving from lying on your back to sitting on the side of a flat bed without using bedrails?: A Little Help needed moving to and from a bed to a chair (including a wheelchair)?: A Little Help needed standing up from a chair using your arms (e.g.,  wheelchair or bedside chair)?: A Little Help needed to walk in hospital room?: A Lot Help needed climbing 3-5 steps with a railing? : A Lot 6 Click Score: 16    End of Session Equipment Utilized During Treatment: Gait belt Activity Tolerance: Patient tolerated treatment well Patient left: in chair;with chair alarm set Nurse Communication: Mobility status (In room with pt as PT exited) PT Visit Diagnosis: Unsteadiness on feet (R26.81);Other abnormalities of gait and mobility (R26.89);Muscle weakness (generalized) (M62.81);Hemiplegia and hemiparesis Hemiplegia - Right/Left: Right Hemiplegia - dominant/non-dominant: Non-dominant Hemiplegia - caused by: Cerebral infarction     Time: 0932-6712 PT Time Calculation (min) (ACUTE ONLY): 42 min  Charges:  $Gait Training: 23-37 mins $Physical Performance Test: 8-22 mins                     Rolinda Roan, PT, DPT Acute Rehabilitation Services Pager: [phone] Office: [place] 10/20/2020, 1:29 PM

## 2020-10-20 NOTE — Progress Notes (Signed)
Occupational Therapy Treatment Patient Details Name: [name] MRN: [id number] DOB: 07-Mar-1961 Today's Date: 10/20/2020    History of present illness 60 y.o. male presenting with fall and possible syncope, balance disturbance, and nausea. 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 PMHx significant for CAD, STEMI s/p CAGB 2021, tobacco abuse, obesity, Hx of colon cancer, DMII, HTN, HLD, and systolic CHF.   OT comments  Pt making steady progress towards OT goals this session. Pt restless upon arrival eager to mobilize OOB. Pt continues to present with impaired balance but making good progress towards OT goals this session. Pt completed functional mobility greater than a household distance with RW and without RW with pt needing MIN guard assist for ambulation with RW and MIN A for ambulation without AD. Pt additionally completing grooming tasks with gross supervision, supervision for LB/UB ADLs from EOB. Pt eager to DC to CIR. Feel CIR is the most appropriate DC plan as pt would benefit from intensity of IR to maximize functional independence before returning home as pt lives alone. Will continue to follow acutely per POC.   Follow Up Recommendations  CIR    Equipment Recommendations  Other (comment) (RW)    Recommendations for Other Services      Precautions / Restrictions Precautions Precautions: Fall Restrictions Weight Bearing Restrictions: No       Mobility Bed Mobility Overal bed mobility: Needs Assistance Bed Mobility: Supine to Sit;Sit to Supine     Supine to sit: Supervision;HOB elevated Sit to supine: Supervision;HOB elevated   General bed mobility comments: no physical assist needed, supervision for safety    Transfers Overall transfer level: Needs assistance Equipment used: None;Rolling walker (2 wheeled) Transfers: Sit to/from Stand Sit to Stand: Supervision;Min guard;From elevated surface          General transfer comment: min guard initially from elevated EOB, but progessing to gross supervision from recliner.    Balance Overall balance assessment: Needs assistance Sitting-balance support: No upper extremity supported;Feet supported Sitting balance-Leahy Scale: Fair Sitting balance - Comments: sitting EOB for LB ADLs with no LOB   Standing balance support: No upper extremity supported;During functional activity Standing balance-Leahy Scale: Fair Standing balance comment: pt standing at sink for ADLs with no AD                           ADL either performed or assessed with clinical judgement   ADL Overall ADL's : Needs assistance/impaired     Grooming: Oral care;Standing;Supervision/safety           Upper Body Dressing : Supervision/safety;Set up;Sitting Upper Body Dressing Details (indicate cue type and reason): to don back side cover Lower Body Dressing: Supervision/safety;Sitting/lateral leans Lower Body Dressing Details (indicate cue type and reason): simulated from recliner with pt adjusting socks Toilet Transfer: Minimal assistance;RW;Ambulation Toilet Transfer Details (indicate cue type and reason): simulated via functional mobiltiy with and without AD with pts balance improving with use of AD         Functional mobility during ADLs: Minimal assistance;Rolling walker General ADL Comments: pt continues to present with balance deficits but very motivated to work with therapies and return to [facility]     Vision Baseline Vision/History: Wears glasses Wears Glasses: At all times Patient Visual Report: No change from baseline     Perception     Praxis      Cognition Arousal/Alertness: Awake/alert Behavior During Therapy: Restless;Agitated Overall Cognitive Status: Impaired/Different  from baseline Area of Impairment: Memory;Safety/judgement                     Memory: Decreased short-term memory (asking multiple times what "OT"means)    Safety/Judgement: Decreased awareness of safety (did not want COTA to stand near him during grooming tasks, despite education)     General Comments: pt restless upon COTA entry, wanting to get up and move. pt with a lot of questions about DC plans and overall POC. pt slightly agitated at first as pt did not want COTA to observe him during grooming tasks, more agreeable after education.        Exercises Other Exercises Other Exercises: oppoistion RUE, finger spearding RUE, composite flexion/ extension in RUE   Shoulder Instructions       General Comments HR 99 bpm post mobility, pt reports arthritis in R hand contributing to decreased grip strength in R hand    Pertinent Vitals/ Pain       Pain Assessment: Faces Faces Pain Scale: Hurts a little bit Pain Location: discomfort related to having to lay in bed all night Pain Descriptors / Indicators: Discomfort Pain Intervention(s): Repositioned  Home Living                                          Prior Functioning/Environment              Frequency  Min 3X/week        Progress Toward Goals  OT Goals(current goals can now be found in the care plan section)  Progress towards OT goals: Progressing toward goals  Acute Rehab OT Goals Patient Stated Goal: to return to PLOF OT Goal Formulation: With patient Time For Goal Achievement: 11/02/20 Potential to Achieve Goals: Good  Plan Discharge plan remains appropriate;Frequency remains appropriate    Co-evaluation                 AM-PAC OT "6 Clicks" Daily Activity     Outcome Measure   Help from another person eating meals?: None Help from another person taking care of personal grooming?: None Help from another person toileting, which includes using toliet, bedpan, or urinal?: A Little Help from another person bathing (including washing, rinsing, drying)?: A Little Help from another person to put on and taking off regular upper body  clothing?: None Help from another person to put on and taking off regular lower body clothing?: None 6 Click Score: 22    End of Session Equipment Utilized During Treatment: Gait belt;Rolling walker  OT Visit Diagnosis: Unsteadiness on feet (R26.81);Muscle weakness (generalized) (M62.81);Hemiplegia and hemiparesis Hemiplegia - Right/Left: Right Hemiplegia - dominant/non-dominant: Non-Dominant Hemiplegia - caused by: Cerebral infarction   Activity Tolerance Patient tolerated treatment well   Patient Left in bed;with call bell/phone within reach   Nurse Communication Mobility status        Time: 0823-0927 OT Time Calculation (min): 64 min  Charges: OT General Charges $OT Visit: 1 Visit OT Treatments $Self Care/Home Management : 53-67 mins  Harley Alto., COTA/L Acute Rehabilitation Services [place] 10/20/2020, 11:30 AM

## 2020-10-20 NOTE — Progress Notes (Signed)
    [facility] has been requested to perform a transesophageal echocardiogram on [name] for stroke.  After careful review of history and examination, the risks and benefits of transesophageal echocardiogram have been explained including risks of esophageal damage, perforation (1:10,000 risk), bleeding, pharyngeal hematoma as well as other potential complications associated with conscious sedation including aspiration, arrhythmia, respiratory failure and death. Alternatives to treatment were discussed, questions were answered. Patient is willing to proceed.   Kathyrn Drown, NP  10/20/2020 3:50 PM

## 2020-10-20 NOTE — Evaluation (Signed)
Speech Language Pathology Evaluation Patient Details Name: [name] MRN: [id number] DOB: 1960-08-15 Today's Date: 10/20/2020 Time: 8527-7824 SLP Time Calculation (min) (ACUTE ONLY): 27 min  Problem List:  Patient Active Problem List   Diagnosis Date Noted  . Acute ischemic stroke ([place]) 10/18/2020  . Nocturia 08/18/2020  . Myalgia 11/21/2019  . History of ST elevation myocardial infarction (STEMI) 10/27/2019  . Right-sided Bell's palsy 11/15/2018  . IBS (irritable bowel syndrome) 08/25/2018  . Chest pain in adult 02/05/2018  . Psoriatic arthritis ([place]) 08/25/2017  . Great toe pain, right 04/18/2017  . Nipple pain 09/30/2016  . Hypotestosteronism 06/22/2016  . Erectile dysfunction 06/22/2016  . Essential hypertension 06/05/2014  . Palpitation 06/05/2014  . Muscle spasm of back 06/05/2014  . Depression with anxiety 10/11/2013  . Preventative health care 10/08/2012  . Right knee pain 11/17/2011  . Hypertriglyceridemia 12/14/2010  . Hyperlipidemia, mixed 09/07/2010  . INSOMNIA, CHRONIC 03/20/2010  . Diabetes mellitus type 2 in obese ([place]) 03/20/2010  . Malignant neoplasm of colon ([place]) 01/02/2010  . History of colon cancer 01/02/2010  . GERD 11/04/2009  . FATTY LIVER DISEASE 11/04/2009  . Obesity 10/03/2009  . Anemia 10/03/2009  . Psoriasis 10/03/2009   Past Medical History:  Past Medical History:  Diagnosis Date  . Anemia 10/03/2009   Qualifier: Diagnosis of  By: [name]   . Anxiety associated with depression 10/11/2013  . Colon cancer ([place])    right colon cancer- adenocarcinoma CEA level isnrmal at 1.8  . Diabetes mellitus   . Diabetes mellitus type 2 in obese [facility]) 03/20/2010   Qualifier: Diagnosis of  By: [name]    . Erectile dysfunction 06/22/2016  . Essential hypertension 06/05/2014  . FATTY LIVER DISEASE 11/04/2009   Qualifier: Diagnosis of  By: [name] MD, Docia Chuck   Qualifier: Diagnosis of  By: [name] MD, Docia Chuck  Last Assessment & Plan:   conirmed by CT scan of abdomen in April 2016 encouraged to minimize simple carbs and fatty foods.  Marland Kitchen GERD 11/04/2009   Qualifier: Diagnosis of  By: [name] MD, Docia Chuck   . Great toe pain, right 04/18/2017  . Hepatic artery stenosis ([place])   . History of colon cancer 01/02/2010   Qualifier: Diagnosis of  By: [name] Dr [name]  Last Assessment & Plan:  Follows closely with gastroenterology, Dr [name]  . Hyperlipidemia   . Hyperlipidemia, mixed 09/07/2010   Qualifier: Diagnosis of  By: [name]   . Hypertension   . Hypertriglyceridemia 12/14/2010  . Hypotestosteronism 06/22/2016  . INSOMNIA, CHRONIC 03/20/2010   Qualifier: Diagnosis of  By: [name] Ambien 10 mg daily does not keep asleep AdvilPM is over sedating and has trouble waking up   . Iron deficiency anemia 2011  . Low testosterone 06/22/2016  . Malignant neoplasm of colon ([place]) 01/02/2010   Qualifier: Diagnosis of  By: [name] Dr [name]   . Muscle spasm of back 06/05/2014  . Nipple pain 09/30/2016  . Obesity   . Palpitations 06/05/2014  . Preventative health care 10/08/2012  . Psoriasis   . Psoriatic arthritis ([place]) 08/25/2017  . Right knee pain 11/17/2011   Past Surgical History:  Past Surgical History:  Procedure Laterality Date  . CARPAL TUNNEL RELEASE     RIGHT  . CHOLECYSTECTOMY  1994  . COLONOSCOPY    . CORONARY/GRAFT ACUTE MI REVASCULARIZATION N/A 10/27/2019   Procedure: Coronary/Graft Acute MI Revascularization;  Surgeon: [name], MD;  Location: [facility];  Service: Cardiovascular;  Laterality: N/A;  . HEMICOLECTOMY     12/17/2009 right  . LEFT HEART CATH AND CORONARY ANGIOGRAPHY N/A 10/27/2019   Procedure: LEFT HEART CATH AND CORONARY ANGIOGRAPHY;  Surgeon: [name], MD;  Location: [facility];  Service: Cardiovascular;  Laterality: N/A;  . POLYPECTOMY     HPI:  60 y.o. male presenting with fall and possible syncope, balance disturbance, and nausea. MRI left  thalamic and left posterior CR infarcts, punctate bilateral occipital and right cerebellar infarcts. PMHx significant for CAD, STEMI s/p CAGB 2021, tobacco abuse, obesity, Hx of colon cancer, DMII, HTN, HLD, and systolic CHF.   Assessment / Plan / Recommendation Clinical Impression  Pt participated in limited cognitive-linguistic assessment using the Cerebellar Cognitive Affective/Schmahmann Syndrome Scale.  Expressive and receptive language was intact based on conversational interaction; speech was clear without an ataxic dysarthria; output was fluent. He was willing to participate in four subsets of the exam - semantic and phonemic fluency, category switching, and digit span forward - all of which he completed with functional scores.  He verbalized his anxiety about test-taking, concerns about failing, and declined to finish the assessment due to his assertion that his intelligence was being insulted.  We reviewed the rationale for cognitive-linguistic assessment after stroke; pt was not convinced of its value. Pt was concerned that declining to finish the exam would impact his ability to go to CIR.  Cognitive assessment is not a priority for him, and we discussed that it was within his prerogative to make that decision.  Provided encouragement and wishes for a full recovery. No further SLP f/u is recommended at this time. Our service will sign off.    SLP Assessment  SLP Recommendation/Assessment: Patient does not need any further Speech Lanaguage Pathology Services    Follow Up Recommendations  None               SLP Evaluation Cognition  Overall Cognitive Status: Impaired/Different from baseline Orientation Level: Oriented X4 Attention: Selective Selective Attention: Appears intact Memory: Impaired Memory Impairment: Storage deficit Awareness:  (unable to assess)       Comprehension  Auditory Comprehension Overall Auditory Comprehension: Appears within functional limits for tasks  assessed Reading Comprehension Reading Status: Not tested    Expression Expression Primary Mode of Expression: Verbal Verbal Expression Overall Verbal Expression: Appears within functional limits for tasks assessed Written Expression Dominant Hand: Left Written Expression: Not tested   Oral / Motor  Oral Motor/Sensory Function Overall Oral Motor/Sensory Function: Within functional limits Motor Speech Overall Motor Speech: Appears within functional limits for tasks assessed   GO                    Juan Quam Laurice 10/20/2020, 1:53 PM  Susann Lawhorne L. Tivis Ringer, [place] Office number [phone] Pager [phone]

## 2020-10-20 NOTE — Plan of Care (Signed)
  Problem: Health Behavior/Discharge Planning: Goal: Ability to manage health-related needs will improve Outcome: Progressing   Problem: Education: Goal: Knowledge of General Education information will improve Description: Including pain rating scale, medication(s)/side effects and non-pharmacologic comfort measures Outcome: Progressing   

## 2020-10-20 NOTE — Anesthesia Preprocedure Evaluation (Addendum)
Anesthesia Evaluation  Patient identified by MRN, date of birth, ID band Patient awake    Reviewed: Allergy & Precautions, NPO status , Patient's Chart, lab work & pertinent test results, reviewed documented beta blocker date and time   History of Anesthesia Complications Negative for: history of anesthetic complications  Airway Mallampati: III  TM Distance: >3 FB Neck ROM: Full    Dental  (+)    Pulmonary former smoker,    Pulmonary exam normal        Cardiovascular hypertension, Pt. on medications and Pt. on home beta blockers + CAD and + Past MI (2021)  Normal cardiovascular exam  TTE 10/19/20: EF 55-60%, RWMAs present, mild LVH, grade II DD, moderate LAE    Neuro/Psych Anxiety Depression CVA    GI/Hepatic Neg liver ROS, GERD  Controlled,Colon ca   Endo/Other  diabetes, Type 2, Insulin Dependent  Renal/GU negative Renal ROS  negative genitourinary   Musculoskeletal  (+) Arthritis ,   Abdominal   Peds  Hematology negative hematology ROS (+)   Anesthesia Other Findings Day of surgery medications reviewed with patient.  Reproductive/Obstetrics negative OB ROS                            Anesthesia Physical Anesthesia Plan  ASA: IV  Anesthesia Plan: MAC   Post-op Pain Management:    Induction:   PONV Risk Score and Plan: Treatment may vary due to age or medical condition and Propofol infusion  Airway Management Planned: Natural Airway and Nasal Cannula  Additional Equipment:   Intra-op Plan:   Post-operative Plan:   Informed Consent: I have reviewed the patients History and Physical, chart, labs and discussed the procedure including the risks, benefits and alternatives for the proposed anesthesia with the patient or authorized representative who has indicated his/her understanding and acceptance.       Plan Discussed with: CRNA  Anesthesia Plan Comments:         Anesthesia Quick Evaluation

## 2020-10-20 NOTE — Progress Notes (Signed)
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                                                        +---------+---------------+---------+-----------+----------+--------------+     Summary: BILATERAL: - No evidence of deep vein thrombosis seen in the lower extremities, bilaterally. -   *See table(s) above for measurements and observations. Electronically signed by Monica Martinez MD on 10/19/2020 at 4:23:09 PM.    Final     PHYSICAL EXAM  Temp:  [97.9 F (36.6 C)-98.9 F (37.2 C)] 98.2 F (36.8 C) (04/04 1613) Pulse Rate:  [82-95] 92 (04/04 1700) Resp:  [17-20] 18 (04/04 1613) BP: (122-140)/(65-78) 140/78 (04/04 1700) SpO2:  [95 %-100 %] 97 % (04/04 1613)  General - Well nourished, well developed, in no apparent distress.  Ophthalmologic - fundi not visualized due to noncooperation.  Cardiovascular - Regular rhythm and rate.  Mental Status -  Level of arousal and orientation to time, place, and person were intact. Language including expression, naming, repetition, comprehension was assessed and found intact.  Cranial Nerves II - XII - II - Visual field intact OU. III, IV, VI - Extraocular  movements intact. V - Facial sensation intact bilaterally. VII - Facial movement intact bilaterally. VIII - Hearing & vestibular intact bilaterally. X - Palate elevates symmetrically. XI - Chin turning & shoulder shrug intact bilaterally. XII - Tongue protrusion intact.  Motor Strength - The patient's strength was normal in all extremities and pronator drift was absent.  Bulk was normal and fasciculations were absent.   Motor Tone - Muscle tone was assessed at the neck and appendages and was normal.  Reflexes - The patient's reflexes were symmetrical in all extremities and he had no pathological reflexes.  Sensory - Light touch, temperature/pinprick were assessed and were symmetrical.    Coordination - The patient had normal movements in the left hand and foot with no ataxia or  dysmetria.  However mild right finger-to-nose dysmetria and heel-to-shin ataxia.  Tremor was absent.  Gait and Station - deferred.   ASSESSMENT/PLAN [name] is a 60 y.o. male with history of diabetes, hypertension, hyperlipidemia, obesity, colon cancer, CAD stent MI status post stenting 10/2019 admitted for imbalance and falling. No tPA given due to outside window.    Stroke: Left thalamic and posterior CR, bilateral occipital punctate, right cerebellum small infarcts, embolic pattern, secondary to unclear source  Resultant right sided impaired coordination  CT no acute abnormality  CTA head and neck left P2 severe stenosis, bilateral ICA bulb and siphon atherosclerosis  MRI left thalamic and left posterior CR infarcts, punctate bilateral occipital and right cerebellar infarcts.  2D Echo EF 55 to 60%  LE venous Doppler no DVT  TEE and loop recorder for cardioembolic work-up in am  LDL 118  HgbA1c 8.7  Hypercoagulable work-up pending  SCDs for VTE prophylaxis  aspirin 81 mg daily and Brilinta (ticagrelor) 90 mg bid prior to admission, now on aspirin 81 mg daily and Brilinta (ticagrelor)  90 mg bid.  Continue on discharge.  Patient counseled to be compliant with his antithrombotic medications  Ongoing aggressive stroke risk factor management  Therapy recommendations: CIR  Disposition: Pending  Diabetes  HgbA1c 8.7 goal < 7.0  Uncontrolled  Currently on Lantus  CBG monitoring  SSI  DM education and close PCP follow up  Hypertension . Stable  Long term BP goal normotensive  Hyperlipidemia  Home meds: None, Lipitor and the Crestor causing muscle pain  LDL 118, goal < 70  Patient agreed to try lowering intensity statin - now on pravastatin 20  Continue statin at discharge  Other Stroke Risk Factors  CAD/MI 10/2019, on aspirin Brilinta PTA  Obesity  Other Active Problems  Colon cancer  Hospital day # 1   Rosalin Hawking, MD PhD Stroke Neurology 10/20/2020 5:33 PM    To contact Stroke Continuity provider, please refer to [email or web address] After hours, contact General Neurology

## 2020-10-20 NOTE — Progress Notes (Signed)
PROGRESS NOTE   [name]  GHW:[id number] DOB: March 06, 1961 DOA: 10/18/2020 PCP: Mosie Lukes, MD  Brief Narrative:  60 year old BMI 42 white male stent 2021 HTN HLD OSA?  HFpEF + systolic EF 78-93% psoriatic arthritis/methotrexate-colon cancer status post resection 2011 Patient not compliant on HTN meds/psoriatic meds Admitted with acute ischemic CVA Stroke team consulted  Hospital-Problem based course  L posterior thalamic punctate occipital cerebellar infarcts Complete work-up TEE/loop recorder 4/5-appreciate cardiology eval ASA/Brilinta per neurology-increase Crestor to 20 mg on discharge Follow hypercoagulable panel ordered 4/4 Allergy to [employer]. ACE reductase inhibitors Pravachol low-dose-refer to potentially lipidology? Will trial Crestor in the outpatient setting and if not tolerating will need outpatient referral through PCP HFrEF 35-40% Resume losartan lower dose 12.5-a.m. blood pressure 130/80 in several weeks Hold Toprol-XL 25 for now resume?  A.m. Euvolemic at this time no decompensated physiology Psoriatic arthritis follows with [facility] rheumatology Outpatient reinitiation methotrexate, ixekizumab, folic acid, prednisone Resume diclofenac as needed joint pain DM TY 2 A1c 8.7-Home dose lispro 15 3 times daily, Lantus 60 daily Continuing Lantus 40, sliding scale at this time Outpatient reeval tight control Anxiety Valium 5 mg as needed    DVT prophylaxis: Brilinta Code Status: Full Family Communication: Exfianc at bedside updated in detail Disposition:  Status is: Inpatient  Remains inpatient appropriate because:Persistent severe electrolyte disturbances, Ongoing diagnostic testing needed not appropriate for outpatient work up and Unsafe d/c plan   Dispo: The patient is from: Home              Anticipated d/c is to: CIR              Patient currently is not medically stable to d/c.   Difficult to place patient No       Consultants:    CIR  Neurology  Cardiology  Procedures: Multiple  Antimicrobials: None   Subjective:  Awake alert conversant pleasant no distress Balance issues still to some degree especially right finger dysmetria  Objective: Vitals:   10/20/20 0347 10/20/20 0753 10/20/20 1223 10/20/20 1613  BP: 122/72 136/71 135/77 135/74  Pulse: 82 87 92 90  Resp: 20 20 18 18   Temp: 98.3 F (36.8 C) 98.1 F (36.7 C) 97.9 F (36.6 C) 98.2 F (36.8 C)  TempSrc: Oral Oral Oral Oral  SpO2: 99% 98% 100% 97%    Intake/Output Summary (Last 24 hours) at 10/20/2020 1640 Last data filed at 10/20/2020 1300 Gross per 24 hour  Intake 660 ml  Output 300 ml  Net 360 ml   There were no vitals filed for this visit.  Examination:  EOMI NCAT no focal deficit pupils equally reactive Thick neck Mallampati 4 Shoulder shrug intact S1-S2 no murmur telemetry = NSR low 90s Abdomen soft no rebound obese nontender nondistended power 5/5 Right side finger-nose-finger dysmetria noted no dysdiadochokinesia Deferred Babinski sensory  Data Reviewed: personally reviewed  Data LDL 118, TG 245, VLDL 51 CRP less than 0.5 B12 252 Echo = EF 55-60% + grade II DD pseudonormalization CBG trend 151-219  Therapy eval = CIR, RW +5 inch wheels 3 and 1  Radiology Studies: CT ANGIO HEAD W OR WO CONTRAST  Result Date: 10/19/2020 CLINICAL DATA:  Initial evaluation for neuro deficit, stroke suspected. EXAM: CT ANGIOGRAPHY HEAD AND NECK TECHNIQUE: Multidetector CT imaging of the head and neck was performed using the standard protocol during bolus administration of intravenous contrast. Multiplanar CT image reconstructions and MIPs were obtained to evaluate the vascular anatomy. Carotid stenosis measurements (when applicable)  are obtained utilizing NASCET criteria, using the distal internal carotid diameter as the denominator. CONTRAST:  59mL OMNIPAQUE IOHEXOL 350 MG/ML SOLN COMPARISON:  Prior MRI from 10/18/2020. FINDINGS: CT HEAD  FINDINGS Brain: Continued interval evolution of acute infarct involving the posterior left thalamus/periventricular white matter, stable in size and distribution. Additional scattered subcentimeter cortical and subcortical posterior circulation infarcts not well seen by CT. No acute intracranial hemorrhage. No new large vessel territory infarct. No mass lesion or midline shift. No hydrocephalus or extra-axial fluid collection. Vascular: No hyperdense vessel. Scattered vascular calcifications noted within the carotid siphons. Skull: Scalp soft tissues and calvarium within normal limits. Sinuses: Paranasal sinuses are clear.  No mastoid effusion. Orbits: Globes and orbital soft tissues within normal limits. Review of the MIP images confirms the above findings CTA NECK FINDINGS Aortic arch: Visualized aortic arch normal in caliber with normal branch pattern. No hemodynamically significant stenosis about the origin of the great vessels. Visualized subclavian arteries widely patent. Right carotid system: Right CCA patent from its origin to the bifurcation without stenosis. Mild calcified plaque about the right bifurcation without significant stenosis. Right ICA patent distally without stenosis, dissection or occlusion. Left carotid system: Left CCA patent from its origin to the bifurcation without stenosis. Concentric soft plaque about the left carotid bulb/proximal left ICA with associated stenosis of up to approximately 50% by NASCET criteria. Left ICA patent distally without stenosis, dissection or occlusion. Vertebral arteries: Both vertebral arteries arise from the subclavian arteries. Vertebral arteries widely patent without stenosis, dissection or occlusion. Skeleton: No acute osseous abnormality. No discrete or worrisome osseous lesions. Other neck: No other acute soft tissue abnormality within the neck. No mass or adenopathy. Upper chest: Visualized upper chest demonstrates no acute finding. Review of the MIP  images confirms the above findings CTA HEAD FINDINGS Anterior circulation: Petrous segments widely patent. Scattered atheromatous change within the carotid siphons with no more than mild to moderate narrowing on the right. No significant stenosis about the left carotid siphon. A1 segments widely patent. Normal anterior communicating artery complex. Anterior cerebral arteries patent to their distal aspects without stenosis. No M1 stenosis or occlusion. Distal MCA branches well perfused and symmetric. Posterior circulation: Both V4 segments widely patent to the vertebrobasilar junction. Neither PICA origin well visualized. Basilar widely patent to its distal aspect. Superior cerebellar arteries patent bilaterally. Both PCAs primarily supplied via the basilar. Right PCA widely patent to its distal aspect. Short-segment moderate to severe proximal left P2 stenosis noted (series 14, image 21). Left PCA otherwise patent to its distal aspect. Venous sinuses: Not well assessed due to timing of the contrast bolus. Anatomic variants: None significant.  No aneurysm. Review of the MIP images confirms the above findings IMPRESSION: CT HEAD IMPRESSION: 1. Continued interval evolution of previously identified multifocal ischemic infarcts, stable and better evaluated on recent brain MRI. No associated hemorrhage or significant regional mass effect. 2. No other new acute intracranial abnormality. CTA HEAD AND NECK IMPRESSION: 1. Negative CTA for large vessel occlusion. 2. Short-segment moderate to severe left P2 stenosis. 3. 50% atheromatous stenosis about the left carotid bulb/proximal left ICA. 4. Additional mild for age atheromatous change elsewhere about the major arterial vasculature of the head and neck. No other hemodynamically significant or correctable stenosis. Electronically Signed   By: Jeannine Boga M.D.   On: 10/19/2020 03:45   CT ANGIO NECK W OR WO CONTRAST  Result Date: 10/19/2020 CLINICAL DATA:  Initial  evaluation for neuro deficit, stroke suspected. EXAM: CT ANGIOGRAPHY  HEAD AND NECK TECHNIQUE: Multidetector CT imaging of the head and neck was performed using the standard protocol during bolus administration of intravenous contrast. Multiplanar CT image reconstructions and MIPs were obtained to evaluate the vascular anatomy. Carotid stenosis measurements (when applicable) are obtained utilizing NASCET criteria, using the distal internal carotid diameter as the denominator. CONTRAST:  54mL OMNIPAQUE IOHEXOL 350 MG/ML SOLN COMPARISON:  Prior MRI from 10/18/2020. FINDINGS: CT HEAD FINDINGS Brain: Continued interval evolution of acute infarct involving the posterior left thalamus/periventricular white matter, stable in size and distribution. Additional scattered subcentimeter cortical and subcortical posterior circulation infarcts not well seen by CT. No acute intracranial hemorrhage. No new large vessel territory infarct. No mass lesion or midline shift. No hydrocephalus or extra-axial fluid collection. Vascular: No hyperdense vessel. Scattered vascular calcifications noted within the carotid siphons. Skull: Scalp soft tissues and calvarium within normal limits. Sinuses: Paranasal sinuses are clear.  No mastoid effusion. Orbits: Globes and orbital soft tissues within normal limits. Review of the MIP images confirms the above findings CTA NECK FINDINGS Aortic arch: Visualized aortic arch normal in caliber with normal branch pattern. No hemodynamically significant stenosis about the origin of the great vessels. Visualized subclavian arteries widely patent. Right carotid system: Right CCA patent from its origin to the bifurcation without stenosis. Mild calcified plaque about the right bifurcation without significant stenosis. Right ICA patent distally without stenosis, dissection or occlusion. Left carotid system: Left CCA patent from its origin to the bifurcation without stenosis. Concentric soft plaque about the left  carotid bulb/proximal left ICA with associated stenosis of up to approximately 50% by NASCET criteria. Left ICA patent distally without stenosis, dissection or occlusion. Vertebral arteries: Both vertebral arteries arise from the subclavian arteries. Vertebral arteries widely patent without stenosis, dissection or occlusion. Skeleton: No acute osseous abnormality. No discrete or worrisome osseous lesions. Other neck: No other acute soft tissue abnormality within the neck. No mass or adenopathy. Upper chest: Visualized upper chest demonstrates no acute finding. Review of the MIP images confirms the above findings CTA HEAD FINDINGS Anterior circulation: Petrous segments widely patent. Scattered atheromatous change within the carotid siphons with no more than mild to moderate narrowing on the right. No significant stenosis about the left carotid siphon. A1 segments widely patent. Normal anterior communicating artery complex. Anterior cerebral arteries patent to their distal aspects without stenosis. No M1 stenosis or occlusion. Distal MCA branches well perfused and symmetric. Posterior circulation: Both V4 segments widely patent to the vertebrobasilar junction. Neither PICA origin well visualized. Basilar widely patent to its distal aspect. Superior cerebellar arteries patent bilaterally. Both PCAs primarily supplied via the basilar. Right PCA widely patent to its distal aspect. Short-segment moderate to severe proximal left P2 stenosis noted (series 14, image 21). Left PCA otherwise patent to its distal aspect. Venous sinuses: Not well assessed due to timing of the contrast bolus. Anatomic variants: None significant.  No aneurysm. Review of the MIP images confirms the above findings IMPRESSION: CT HEAD IMPRESSION: 1. Continued interval evolution of previously identified multifocal ischemic infarcts, stable and better evaluated on recent brain MRI. No associated hemorrhage or significant regional mass effect. 2. No  other new acute intracranial abnormality. CTA HEAD AND NECK IMPRESSION: 1. Negative CTA for large vessel occlusion. 2. Short-segment moderate to severe left P2 stenosis. 3. 50% atheromatous stenosis about the left carotid bulb/proximal left ICA. 4. Additional mild for age atheromatous change elsewhere about the major arterial vasculature of the head and neck. No other hemodynamically significant or correctable  stenosis. Electronically Signed   By: Jeannine Boga M.D.   On: 10/19/2020 03:45   MR BRAIN WO CONTRAST  Result Date: 10/18/2020 CLINICAL DATA:  Initial evaluation for acute stroke. EXAM: MRI HEAD WITHOUT CONTRAST TECHNIQUE: Multiplanar, multiecho pulse sequences of the brain and surrounding structures were obtained without intravenous contrast. COMPARISON:  Prior CT from earlier the same day. FINDINGS: Brain: Cerebral volume within normal limits for age. Minimal T2/FLAIR hyperintensity seen involving the periventricular and deep white matter of both cerebral hemispheres most like related chronic microvascular ischemic disease, mild for age. 3.4 cm focus of restricted diffusion involving the posterior left thalamus/periventricular white matter is seen, consistent with an acute ischemic infarct (series 2, image 90). Multiple additional scattered subcentimeter cortical and subcortical ischemic infarcts seen involving the bilateral parieto-occipital regions (series 2, images 95, 84, 81). Few additional scattered subcentimeter ischemic infarcts seen involving the right cerebellum (series 2, images 75, 74, 73). No associated hemorrhage or mass effect. Findings are likely embolic in nature. Gray-white matter differentiation otherwise maintained. No other areas of chronic cortical infarction. No other foci of susceptibility artifact to suggest acute or chronic intracranial hemorrhage. No mass lesion, midline shift or mass effect. No hydrocephalus or extra-axial fluid collection. Pituitary gland suprasellar  region within normal limits. Midline structures intact. Vascular: Major intracranial vascular flow voids are maintained. Skull and upper cervical spine: Craniocervical junction within normal limits. Bone marrow signal intensity normal. No focal marrow replacing lesion. No scalp soft tissue abnormality. Sinuses/Orbits: Globes and orbital soft tissues within normal limits. Mild scattered mucosal thickening noted within the ethmoidal air cells and maxillary sinuses. Paranasal sinuses are otherwise clear. No significant mastoid effusion. Inner ear structures grossly normal. Other: None. IMPRESSION: 1. 3.4 cm acute ischemic nonhemorrhagic infarct involving the posterior left thalamus/periventricular white matter. 2. Additional scattered subcentimeter cortical and subcortical ischemic infarcts involving the bilateral parieto-occipital regions and right cerebellum. Findings are likely embolic in nature. 3. Underlying mild chronic microvascular ischemic disease. Electronically Signed   By: Jeannine Boga M.D.   On: 10/18/2020 20:55   DG Chest Port 1 View  Result Date: 10/19/2020 CLINICAL DATA:  60 year old male with left thalamic and other small scattered cerebral infarcts. EXAM: PORTABLE CHEST 1 VIEW COMPARISON:  Chest radiographs 11/05/2019 and earlier. CTA neck 0310 hours today. FINDINGS: Portable AP upright view at 1027 hours. Lung volumes and mediastinal contours remain normal. Visualized tracheal air column is within normal limits. Allowing for portable technique the lungs are clear. No pneumothorax or pleural effusion. Paucity of bowel gas in the upper abdomen. No acute osseous abnormality identified. IMPRESSION: Negative portable chest. Electronically Signed   By: Genevie Ann M.D.   On: 10/19/2020 10:41   ECHOCARDIOGRAM COMPLETE  Result Date: 10/19/2020    ECHOCARDIOGRAM REPORT   Patient Name:   KEIVON GARDEN Date of Exam: 10/19/2020 Medical Rec #:  [id number]      Height:       69.0 in Accession #:     [id number]     Weight:       250.5 lb Date of Birth:  15-Nov-1960      BSA:          2.273 m Patient Age:    6 years       BP:           120/66 mmHg Patient Gender: M              HR:  95 bpm. Exam Location:  Inpatient Procedure: 2D Echo, Cardiac Doppler, Color Doppler and Intracardiac            Opacification Agent Indications:    Stroke I63.9  History:        Patient has prior history of Echocardiogram examinations, most                 recent 10/28/2019. Risk Factors:Hypertension, Dyslipidemia and                 Diabetes. Cancer. GERD.  Sonographer:    Jonelle Sidle Dance Referring Phys: [id number] [place]  1. Left ventricular ejection fraction, by estimation, is 55 to 60%. The left ventricle has normal function. The left ventricle demonstrates regional wall motion abnormalities (see scoring diagram/findings for description). There is mild left ventricular  hypertrophy. Left ventricular diastolic parameters are consistent with Grade II diastolic dysfunction (pseudonormalization).  2. Right ventricular systolic function is normal. The right ventricular size is normal.  3. Left atrial size was moderately dilated.  4. The mitral valve is normal in structure. No evidence of mitral valve regurgitation. No evidence of mitral stenosis.  5. The aortic valve is normal in structure. Aortic valve regurgitation is not visualized. No aortic stenosis is present.  6. The inferior vena cava is normal in size with greater than 50% respiratory variability, suggesting right atrial pressure of 3 mmHg. Conclusion(s)/Recommendation(s): No intracardiac source of embolism detected on this transthoracic study. A transesophageal echocardiogram is recommended to exclude cardiac source of embolism if clinically indicated. FINDINGS  Left Ventricle: Left ventricular ejection fraction, by estimation, is 55 to 60%. The left ventricle has normal function. The left ventricle demonstrates regional wall motion abnormalities.  Definity contrast agent was given IV to delineate the left ventricular endocardial borders. The left ventricular internal cavity size was normal in size. There is mild left ventricular hypertrophy. Left ventricular diastolic parameters are consistent with Grade II diastolic dysfunction (pseudonormalization).  LV Wall Scoring: The apical septal segment and apex are akinetic. Right Ventricle: The right ventricular size is normal. No increase in right ventricular wall thickness. Right ventricular systolic function is normal. Left Atrium: Left atrial size was moderately dilated. Right Atrium: Right atrial size was normal in size. Pericardium: There is no evidence of pericardial effusion. Mitral Valve: The mitral valve is normal in structure. Mild mitral annular calcification. No evidence of mitral valve regurgitation. No evidence of mitral valve stenosis. Tricuspid Valve: The tricuspid valve is normal in structure. Tricuspid valve regurgitation is not demonstrated. No evidence of tricuspid stenosis. Aortic Valve: The aortic valve is normal in structure. Aortic valve regurgitation is not visualized. No aortic stenosis is present. Pulmonic Valve: The pulmonic valve was normal in structure. Pulmonic valve regurgitation is not visualized. No evidence of pulmonic stenosis. Aorta: The aortic root is normal in size and structure. Venous: The inferior vena cava is normal in size with greater than 50% respiratory variability, suggesting right atrial pressure of 3 mmHg. IAS/Shunts: No atrial level shunt detected by color flow Doppler.  LEFT VENTRICLE PLAX 2D LVIDd:         4.60 cm  Diastology LVIDs:         3.30 cm  LV e' medial:    8.92 cm/s LV PW:         1.30 cm  LV E/e' medial:  7.8 LV IVS:        1.10 cm  LV e' lateral:   9.90 cm/s LVOT diam:  1.90 cm  LV E/e' lateral: 7.0 LV SV:         52 LV SV Index:   23 LVOT Area:     2.84 cm  RIGHT VENTRICLE             IVC RV Basal diam:  3.10 cm     IVC diam: 1.80 cm RV Mid diam:     1.70 cm RV S prime:     13.40 cm/s TAPSE (M-mode): 1.9 cm LEFT ATRIUM              Index       RIGHT ATRIUM           Index LA diam:        4.70 cm  2.07 cm/m  RA Area:     15.00 cm LA Vol (A2C):   111.0 ml 48.82 ml/m RA Volume:   31.70 ml  13.94 ml/m LA Vol (A4C):   82.4 ml  36.24 ml/m LA Biplane Vol: 97.3 ml  42.80 ml/m  AORTIC VALVE LVOT Vmax:   94.60 cm/s LVOT Vmean:  62.900 cm/s LVOT VTI:    0.183 m  AORTA Ao Root diam: 3.70 cm Ao Asc diam:  3.10 cm MITRAL VALVE MV Area (PHT): 3.91 cm    SHUNTS MV Decel Time: 194 msec    Systemic VTI:  0.18 m MV E velocity: 69.40 cm/s  Systemic Diam: 1.90 cm MV A velocity: 55.70 cm/s MV E/A ratio:  1.25 Candee Furbish MD Electronically signed by Candee Furbish MD Signature Date/Time: 10/19/2020/4:45:27 PM    Final    VAS [place] LOWER EXTREMITY VENOUS (DVT)  Result Date: 10/19/2020  Lower Venous DVT Study Indications: Stroke.  Comparison Study: No prior study on file Performing Technologist: Sharion Dove RVS  Examination Guidelines: A complete evaluation includes B-mode imaging, spectral Doppler, color Doppler, and power Doppler as needed of all accessible portions of each vessel. Bilateral testing is considered an integral part of a complete examination. Limited examinations for reoccurring indications may be performed as noted. The reflux portion of the exam is performed with the patient in reverse Trendelenburg.  +---------+---------------+---------+-----------+----------+--------------+ RIGHT    CompressibilityPhasicitySpontaneityPropertiesThrombus Aging +---------+---------------+---------+-----------+----------+--------------+ CFV      Full           Yes      Yes                                 +---------+---------------+---------+-----------+----------+--------------+ SFJ      Full                                                        +---------+---------------+---------+-----------+----------+--------------+ FV Prox  Full                                                         +---------+---------------+---------+-----------+----------+--------------+ FV Mid   Full                                                        +---------+---------------+---------+-----------+----------+--------------+  FV DistalFull                                                        +---------+---------------+---------+-----------+----------+--------------+ PFV      Full                                                        +---------+---------------+---------+-----------+----------+--------------+ POP      Full           Yes      Yes                                 +---------+---------------+---------+-----------+----------+--------------+ PTV      Full                                                        +---------+---------------+---------+-----------+----------+--------------+ PERO     Full                                                        +---------+---------------+---------+-----------+----------+--------------+   +---------+---------------+---------+-----------+----------+--------------+ LEFT     CompressibilityPhasicitySpontaneityPropertiesThrombus Aging +---------+---------------+---------+-----------+----------+--------------+ CFV      Full           Yes      Yes                                 +---------+---------------+---------+-----------+----------+--------------+ SFJ      Full                                                        +---------+---------------+---------+-----------+----------+--------------+ FV Prox  Full                                                        +---------+---------------+---------+-----------+----------+--------------+ FV Mid   Full                                                        +---------+---------------+---------+-----------+----------+--------------+ FV DistalFull                                                         +---------+---------------+---------+-----------+----------+--------------+  PFV      Full                                                        +---------+---------------+---------+-----------+----------+--------------+ POP      Full           Yes      Yes                                 +---------+---------------+---------+-----------+----------+--------------+ PTV      Full                                                        +---------+---------------+---------+-----------+----------+--------------+ PERO     Full                                                        +---------+---------------+---------+-----------+----------+--------------+     Summary: BILATERAL: - No evidence of deep vein thrombosis seen in the lower extremities, bilaterally. -   *See table(s) above for measurements and observations. Electronically signed by Monica Martinez MD on 10/19/2020 at 4:23:09 PM.    Final      Scheduled Meds: .  stroke: mapping our early stages of recovery book   Does not apply Once  . aspirin EC  81 mg Oral Daily  . folic acid  1 mg Oral Daily  . insulin aspart  0-9 Units Subcutaneous Q4H  . insulin glargine  40 Units Subcutaneous Daily  . pravastatin  20 mg Oral q1800  . ticagrelor  90 mg Oral BID   Continuous Infusions:    LOS: 1 day   Time spent: [place], MD [facility] To contact the attending provider between 7A-7P or the covering provider during after hours 7P-7A, please log into the web site [email or web address] and access using universal [place] password for that web site. If you do not have the password, please call the hospital operator.  10/20/2020, 4:40 PM  2

## 2020-10-20 NOTE — Progress Notes (Signed)
Inpatient Rehabilitation Admissions Coordinator  I met with patient at bedside with his friend, Toni. We discussed goals an expectations of a possible CIR admit. He is in agreement to admit. I will begin insurance authorization with [employer]. Hopeful for admit after medical workup complete if approved by [employer].    , RN, MSN Rehab Admissions Coordinator [phone] 10/20/2020 2:44 PM  

## 2020-10-20 NOTE — Progress Notes (Signed)
   10/20/20 1516  Clinical Encounter Type  Visited With Patient and family together  Visit Type Initial  Referral From Nurse  Consult/Referral To Chaplain   Chaplain responded to consult request for AD. Pt and Pt's friend were present. Chaplain provided AD education and explained the notarization process. There were no questions at the time but chaplain remains available.  This note was prepared by Chaplain Resident, Dante Gang, MDiv. Chaplain remains available as needed through the on-call pager: [phone].

## 2020-10-20 NOTE — Hospital Course (Addendum)
     Data LDL 118, TG 245, VLDL 51 CRP less than 0.5 B12 252

## 2020-10-21 ENCOUNTER — Encounter (HOSPITAL_COMMUNITY): Payer: Self-pay | Admitting: Physical Medicine & Rehabilitation

## 2020-10-21 ENCOUNTER — Encounter (HOSPITAL_COMMUNITY): Payer: Self-pay | Admitting: Certified Registered Nurse Anesthetist

## 2020-10-21 ENCOUNTER — Inpatient Hospital Stay (HOSPITAL_COMMUNITY): Payer: Commercial Managed Care - PPO | Admitting: Anesthesiology

## 2020-10-21 ENCOUNTER — Encounter (HOSPITAL_COMMUNITY)
Admission: EM | Disposition: A | Discharge status: Rehabilitation Facility | Payer: Self-pay | Source: Home / Self Care | Attending: Family Medicine

## 2020-10-21 ENCOUNTER — Other Ambulatory Visit: Payer: Self-pay

## 2020-10-21 ENCOUNTER — Inpatient Hospital Stay (HOSPITAL_COMMUNITY): Payer: Commercial Managed Care - PPO

## 2020-10-21 ENCOUNTER — Inpatient Hospital Stay (HOSPITAL_COMMUNITY)
Admission: RE | Admit: 2020-10-21 | Discharge: 2020-10-28 | DRG: 057 | Disposition: A | Discharge status: Home / Self Care | Payer: Commercial Managed Care - PPO | Source: Intra-hospital | Attending: Physical Medicine & Rehabilitation | Admitting: Physical Medicine & Rehabilitation

## 2020-10-21 DIAGNOSIS — Z6836 Body mass index (BMI) 36.0-36.9, adult: Secondary | ICD-10-CM

## 2020-10-21 DIAGNOSIS — Z833 Family history of diabetes mellitus: Secondary | ICD-10-CM

## 2020-10-21 DIAGNOSIS — Z9049 Acquired absence of other specified parts of digestive tract: Secondary | ICD-10-CM | POA: Diagnosis not present

## 2020-10-21 DIAGNOSIS — I10 Essential (primary) hypertension: Secondary | ICD-10-CM

## 2020-10-21 DIAGNOSIS — L4050 Arthropathic psoriasis, unspecified: Secondary | ICD-10-CM | POA: Diagnosis present

## 2020-10-21 DIAGNOSIS — Z20822 Contact with and (suspected) exposure to covid-19: Secondary | ICD-10-CM | POA: Diagnosis present

## 2020-10-21 DIAGNOSIS — Z716 Tobacco abuse counseling: Secondary | ICD-10-CM | POA: Diagnosis not present

## 2020-10-21 DIAGNOSIS — K760 Fatty (change of) liver, not elsewhere classified: Secondary | ICD-10-CM | POA: Diagnosis present

## 2020-10-21 DIAGNOSIS — Z720 Tobacco use: Secondary | ICD-10-CM

## 2020-10-21 DIAGNOSIS — I5022 Chronic systolic (congestive) heart failure: Secondary | ICD-10-CM | POA: Diagnosis present

## 2020-10-21 DIAGNOSIS — Z8673 Personal history of transient ischemic attack (TIA), and cerebral infarction without residual deficits: Secondary | ICD-10-CM | POA: Diagnosis present

## 2020-10-21 DIAGNOSIS — I639 Cerebral infarction, unspecified: Secondary | ICD-10-CM

## 2020-10-21 DIAGNOSIS — I110 Hypertensive heart disease with heart failure: Secondary | ICD-10-CM | POA: Diagnosis present

## 2020-10-21 DIAGNOSIS — Z7982 Long term (current) use of aspirin: Secondary | ICD-10-CM

## 2020-10-21 DIAGNOSIS — I6381 Other cerebral infarction due to occlusion or stenosis of small artery: Secondary | ICD-10-CM

## 2020-10-21 DIAGNOSIS — Z7902 Long term (current) use of antithrombotics/antiplatelets: Secondary | ICD-10-CM

## 2020-10-21 DIAGNOSIS — Z85038 Personal history of other malignant neoplasm of large intestine: Secondary | ICD-10-CM | POA: Diagnosis not present

## 2020-10-21 DIAGNOSIS — I6349 Cerebral infarction due to embolism of other cerebral artery: Secondary | ICD-10-CM

## 2020-10-21 DIAGNOSIS — R2689 Other abnormalities of gait and mobility: Secondary | ICD-10-CM | POA: Diagnosis present

## 2020-10-21 DIAGNOSIS — E1142 Type 2 diabetes mellitus with diabetic polyneuropathy: Secondary | ICD-10-CM | POA: Diagnosis present

## 2020-10-21 DIAGNOSIS — F418 Other specified anxiety disorders: Secondary | ICD-10-CM | POA: Diagnosis present

## 2020-10-21 DIAGNOSIS — Z8249 Family history of ischemic heart disease and other diseases of the circulatory system: Secondary | ICD-10-CM

## 2020-10-21 DIAGNOSIS — E782 Mixed hyperlipidemia: Secondary | ICD-10-CM | POA: Diagnosis present

## 2020-10-21 DIAGNOSIS — K219 Gastro-esophageal reflux disease without esophagitis: Secondary | ICD-10-CM | POA: Diagnosis present

## 2020-10-21 DIAGNOSIS — E876 Hypokalemia: Secondary | ICD-10-CM | POA: Diagnosis not present

## 2020-10-21 DIAGNOSIS — I69398 Other sequelae of cerebral infarction: Principal | ICD-10-CM

## 2020-10-21 DIAGNOSIS — I252 Old myocardial infarction: Secondary | ICD-10-CM | POA: Diagnosis not present

## 2020-10-21 DIAGNOSIS — R269 Unspecified abnormalities of gait and mobility: Secondary | ICD-10-CM | POA: Diagnosis present

## 2020-10-21 DIAGNOSIS — E1169 Type 2 diabetes mellitus with other specified complication: Secondary | ICD-10-CM

## 2020-10-21 DIAGNOSIS — Z79899 Other long term (current) drug therapy: Secondary | ICD-10-CM

## 2020-10-21 DIAGNOSIS — Z713 Dietary counseling and surveillance: Secondary | ICD-10-CM

## 2020-10-21 DIAGNOSIS — I2510 Atherosclerotic heart disease of native coronary artery without angina pectoris: Secondary | ICD-10-CM | POA: Diagnosis present

## 2020-10-21 DIAGNOSIS — D509 Iron deficiency anemia, unspecified: Secondary | ICD-10-CM | POA: Diagnosis present

## 2020-10-21 DIAGNOSIS — Z888 Allergy status to other drugs, medicaments and biological substances status: Secondary | ICD-10-CM

## 2020-10-21 DIAGNOSIS — Z794 Long term (current) use of insulin: Secondary | ICD-10-CM

## 2020-10-21 DIAGNOSIS — E669 Obesity, unspecified: Secondary | ICD-10-CM | POA: Diagnosis present

## 2020-10-21 DIAGNOSIS — G4700 Insomnia, unspecified: Secondary | ICD-10-CM | POA: Diagnosis present

## 2020-10-21 HISTORY — PX: TEE WITHOUT CARDIOVERSION: SHX5443

## 2020-10-21 HISTORY — PX: BUBBLE STUDY: SHX6837

## 2020-10-21 HISTORY — PX: LOOP RECORDER INSERTION: EP1214

## 2020-10-21 LAB — LUPUS ANTICOAGULANT PANEL
DRVVT: 42.9 s (ref 0.0–47.0)
PTT Lupus Anticoagulant: 35.5 s (ref 0.0–51.9)

## 2020-10-21 LAB — BETA-2-GLYCOPROTEIN I ABS, IGG/M/A
Beta-2 Glyco I IgG: <9 GPI IgG units (ref 0–20)
Beta-2-Glycoprotein I IgA: <9 GPI IgA units (ref 0–25)
Beta-2-Glycoprotein I IgM: <9 GPI IgM units (ref 0–32)

## 2020-10-21 LAB — GLUCOSE, CAPILLARY
Glucose-Capillary: 126 mg/dL — ABNORMAL HIGH (ref 70–99)
Glucose-Capillary: 141 mg/dL — ABNORMAL HIGH (ref 70–99)
Glucose-Capillary: 142 mg/dL — ABNORMAL HIGH (ref 70–99)
Glucose-Capillary: 208 mg/dL — ABNORMAL HIGH (ref 70–99)
Glucose-Capillary: 239 mg/dL — ABNORMAL HIGH (ref 70–99)

## 2020-10-21 LAB — ANTINUCLEAR ANTIBODIES, IFA: ANA Ab, IFA: NEGATIVE

## 2020-10-21 LAB — CARDIOLIPIN ANTIBODIES, IGG, IGM, IGA
Anticardiolipin IgA: <9 APL U/mL (ref 0–11)
Anticardiolipin IgG: <9 GPL U/mL (ref 0–14)
Anticardiolipin IgM: <9 MPL U/mL (ref 0–12)

## 2020-10-21 LAB — HOMOCYSTEINE: Homocysteine: 9.3 umol/L (ref 0.0–14.5)

## 2020-10-21 SURGERY — LOOP RECORDER INSERTION

## 2020-10-21 SURGERY — ECHOCARDIOGRAM, TRANSESOPHAGEAL
Anesthesia: Monitor Anesthesia Care

## 2020-10-21 MED ORDER — BLOOD PRESSURE CONTROL BOOK
Freq: Once | Status: DC
  Filled 2020-10-21: qty 1

## 2020-10-21 MED ORDER — SODIUM CHLORIDE 0.9 % IV SOLN: INTRAVENOUS | Status: DC

## 2020-10-21 MED ORDER — LIVING WELL WITH DIABETES BOOK
Freq: Once | Status: DC
  Filled 2020-10-21: qty 1

## 2020-10-21 MED ORDER — FOLIC ACID 1 MG PO TABS
1.0000 mg | ORAL_TABLET | Freq: Every day | ORAL | Status: DC
  Administered 2020-10-22 – 2020-10-27 (×6): 1 mg via ORAL
  Filled 2020-10-21 (×7): qty 1

## 2020-10-21 MED ORDER — TICAGRELOR 90 MG PO TABS
90.0000 mg | ORAL_TABLET | Freq: Two times a day (BID) | ORAL | Status: DC
  Administered 2020-10-21 – 2020-10-27 (×13): 90 mg via ORAL
  Filled 2020-10-21 (×14): qty 1

## 2020-10-21 MED ORDER — LOSARTAN POTASSIUM 25 MG PO TABS
12.5000 mg | ORAL_TABLET | Freq: Every day | ORAL | Status: DC
  Administered 2020-10-22 – 2020-10-27 (×6): 12.5 mg via ORAL
  Filled 2020-10-21 (×7): qty 1

## 2020-10-21 MED ORDER — PROPOFOL 500 MG/50ML IV EMUL
INTRAVENOUS | Status: DC | PRN
  Administered 2020-10-21: 75 ug/kg/min via INTRAVENOUS

## 2020-10-21 MED ORDER — PRAVASTATIN SODIUM 20 MG PO TABS: 20.0000 mg | ORAL_TABLET | Freq: Every day | ORAL | 0 refills | Status: DC

## 2020-10-21 MED ORDER — ASPIRIN EC 81 MG PO TBEC
81.0000 mg | DELAYED_RELEASE_TABLET | Freq: Every day | ORAL | Status: DC
  Administered 2020-10-22 – 2020-10-27 (×6): 81 mg via ORAL
  Filled 2020-10-21 (×7): qty 1

## 2020-10-21 MED ORDER — ACETAMINOPHEN 650 MG RE SUPP: 650.0000 mg | RECTAL | Status: DC | PRN

## 2020-10-21 MED ORDER — LIDOCAINE-EPINEPHRINE 1 %-1:100000 IJ SOLN
INTRAMUSCULAR | Status: DC | PRN
  Administered 2020-10-21: 20 mL

## 2020-10-21 MED ORDER — ACETAMINOPHEN 160 MG/5ML PO SOLN: 650.0000 mg | ORAL | Status: DC | PRN

## 2020-10-21 MED ORDER — EXERCISE FOR HEART AND HEALTH BOOK
Freq: Once | Status: DC
  Filled 2020-10-21: qty 1

## 2020-10-21 MED ORDER — LIDOCAINE-EPINEPHRINE 1 %-1:100000 IJ SOLN
INTRAMUSCULAR | Status: AC
  Filled 2020-10-21: qty 1

## 2020-10-21 MED ORDER — INSULIN GLARGINE 100 UNIT/ML ~~LOC~~ SOLN
40.0000 [IU] | Freq: Every day | SUBCUTANEOUS | Status: DC
  Administered 2020-10-22 – 2020-10-25 (×4): 40 [IU] via SUBCUTANEOUS
  Filled 2020-10-21 (×5): qty 0.4

## 2020-10-21 MED ORDER — PROPOFOL 10 MG/ML IV BOLUS
INTRAVENOUS | Status: DC | PRN
  Administered 2020-10-21 (×3): 10 mg via INTRAVENOUS
  Administered 2020-10-21: 30 mg via INTRAVENOUS

## 2020-10-21 MED ORDER — ACETAMINOPHEN 325 MG PO TABS
650.0000 mg | ORAL_TABLET | ORAL | Status: DC | PRN
  Administered 2020-10-24 (×2): 650 mg via ORAL
  Filled 2020-10-21 (×2): qty 2

## 2020-10-21 MED ORDER — INSULIN ASPART 100 UNIT/ML ~~LOC~~ SOLN
0.0000 [IU] | SUBCUTANEOUS | Status: DC
  Administered 2020-10-21: 3 [IU] via SUBCUTANEOUS
  Administered 2020-10-22: 2 [IU] via SUBCUTANEOUS
  Administered 2020-10-22: 1 [IU] via SUBCUTANEOUS
  Administered 2020-10-22: 2 [IU] via SUBCUTANEOUS
  Administered 2020-10-22: 1 [IU] via SUBCUTANEOUS
  Administered 2020-10-22: 2 [IU] via SUBCUTANEOUS

## 2020-10-21 MED ORDER — LANTUS SOLOSTAR 100 UNIT/ML ~~LOC~~ SOPN: 40.0000 [IU] | PEN_INJECTOR | Freq: Every day | SUBCUTANEOUS | 3 refills | Status: DC

## 2020-10-21 MED ORDER — LIDOCAINE 2% (20 MG/ML) 5 ML SYRINGE
INTRAMUSCULAR | Status: DC | PRN
  Administered 2020-10-21: 60 mg via INTRAVENOUS

## 2020-10-21 MED ORDER — DIAZEPAM 5 MG PO TABS
5.0000 mg | ORAL_TABLET | Freq: Every day | ORAL | Status: DC | PRN
  Administered 2020-10-24: 5 mg via ORAL
  Filled 2020-10-21 (×3): qty 1

## 2020-10-21 MED ORDER — PRAVASTATIN SODIUM 10 MG PO TABS
20.0000 mg | ORAL_TABLET | Freq: Every day | ORAL | Status: DC
  Administered 2020-10-22 – 2020-10-23 (×2): 20 mg via ORAL
  Filled 2020-10-21 (×2): qty 2

## 2020-10-21 SURGICAL SUPPLY — 2 items
MONITOR REVEAL LINQ II (Prosthesis & Implant Heart) ×1 IMPLANT
PACK LOOP INSERTION (CUSTOM PROCEDURE TRAY) ×2 IMPLANT

## 2020-10-21 NOTE — Progress Notes (Incomplete)
Pt safely discharged to CIR. Report given to RN. Discharge packet provided. VS wnL for patient and as per flow. IVs in place per admitting RN request. Discharge instructions provided and all questions and concerns addressed. Pt verbalized understanding. Loop recorder site C/D/I.

## 2020-10-21 NOTE — Progress Notes (Signed)
Inpatient Rehabilitation Admissions Coordinator  I have insurance approval to admit him to CIR today. I met with patient at bedside, spoke with Dr. Verlon Au and will make the arrangements to admit today.  Danne Baxter, RN, MSN Rehab Admissions Coordinator [phone] 10/21/2020 4:19 PM

## 2020-10-21 NOTE — Progress Notes (Signed)
Inpatient Rehabilitation Admissions Coordinator  I await insurance approval to admit patient to CIR.  Danne Baxter, RN, MSN Rehab Admissions Coordinator [phone] 10/21/2020 12:01 PM

## 2020-10-21 NOTE — Progress Notes (Signed)
PT Cancellation Note  Patient Details Name: [name] MRN: [id number] DOB: 06-06-1961   Cancelled Treatment:    Reason Eval/Treat Not Completed: Patient at procedure or test/unavailable   Off the floor at TEE;   Will follow up later today as time allows;  Otherwise, will follow up for PT tomorrow;   Thank you,  Roney Marion, PT  Acute Rehabilitation Services Pager [phone] Office [phone]     Colletta [place] 10/21/2020, 8:19 AM

## 2020-10-21 NOTE — Progress Notes (Signed)
Inpatient Rehabilitation Medication Review by a Pharmacist  A complete drug regimen review was completed for this patient to identify any potential clinically significant medication issues.  Clinically significant medication issues were identified:  yes   Type of Medication Issue Identified Description of Issue Urgent (address now) Non-Urgent (address on AM team rounds) Plan Plan Accepted by Provider? (Yes / No / Pending AM Rounds)                                Additional Drug Therapy Needed  Dc summary said to continue diclofenac, ixekizumab, methotrexate, metoprolol, prednisone, ambien but not on transfer Non-urgent Message PA in AM   Other         Name of provider notified for urgent issues identified: Cathlyn Parsons, PA-C  Provider Method of Notification: secure chat   For non-urgent medication issues to be resolved on team rounds tomorrow morning a [facility] [place] was sent to:    Pharmacist comments:   Time spent performing this drug regimen review (minutes):  10

## 2020-10-21 NOTE — Anesthesia Postprocedure Evaluation (Signed)
Anesthesia Post Note  Patient: [name]  Procedure(s) Performed: TRANSESOPHAGEAL ECHOCARDIOGRAM (TEE) (N/A ) BUBBLE STUDY     Patient location during evaluation: PACU Anesthesia Type: MAC Level of consciousness: awake and alert and oriented Pain management: pain level controlled Vital Signs Assessment: post-procedure vital signs reviewed and stable Respiratory status: spontaneous breathing, nonlabored ventilation and respiratory function stable Cardiovascular status: blood pressure returned to baseline Postop Assessment: no apparent nausea or vomiting Anesthetic complications: no   No complications documented.  Last Vitals:  Vitals:   10/21/20 0900 10/21/20 0920  BP: 140/81 130/83  Pulse: 81 78  Resp: 16 15  Temp:  36.6 C  SpO2: 100% 100%    Last Pain:  Vitals:   10/21/20 0920  TempSrc: Oral  PainSc: 0-No pain                 Brennan Bailey

## 2020-10-21 NOTE — Progress Notes (Signed)
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 perfused and symmetric. Posterior circulation: Both V4 segments widely patent to the vertebrobasilar junction. Neither PICA origin well visualized. Basilar widely patent to its distal aspect. Superior cerebellar arteries patent bilaterally. Both PCAs primarily supplied via the basilar. Right PCA widely patent to its distal aspect. Short-segment moderate to severe proximal left P2 stenosis noted (series 14, image 21). Left PCA otherwise patent to its distal aspect. Venous sinuses: Not well assessed due to timing of the contrast bolus. Anatomic variants: None significant.  No aneurysm. Review of the MIP images confirms the above findings IMPRESSION: CT HEAD IMPRESSION: 1. Continued interval evolution of previously identified multifocal ischemic infarcts, stable and better evaluated on recent brain MRI. No associated hemorrhage or significant regional mass effect. 2. No other new acute intracranial abnormality. CTA HEAD AND NECK IMPRESSION: 1. Negative CTA for large vessel occlusion. 2. Short-segment  moderate to severe left P2 stenosis. 3. 50% atheromatous stenosis about the left carotid bulb/proximal left ICA. 4. Additional mild for age atheromatous change elsewhere about the major arterial vasculature of the head and neck. No other hemodynamically significant or correctable stenosis. Electronically Signed   By: Jeannine Boga M.D.   On: 10/19/2020 03:45    MR BRAIN WO CONTRAST   Result Date: 10/18/2020 CLINICAL DATA:  Initial evaluation for acute stroke. EXAM: MRI HEAD WITHOUT CONTRAST TECHNIQUE: Multiplanar, multiecho pulse sequences of the brain and surrounding structures were obtained without intravenous contrast. COMPARISON:  Prior CT from earlier the same day. FINDINGS: Brain: Cerebral volume within normal limits for age. Minimal T2/FLAIR hyperintensity seen involving the periventricular and deep white matter of both cerebral hemispheres most like related chronic microvascular ischemic disease, mild for age. 3.4 cm focus of restricted diffusion involving the posterior left thalamus/periventricular white matter is seen, consistent with an acute ischemic infarct (series 2, image 90). Multiple additional scattered subcentimeter cortical and subcortical ischemic infarcts seen involving the bilateral parieto-occipital regions (series 2, images 95, 84, 81). Few additional scattered subcentimeter ischemic infarcts seen involving the right cerebellum (series 2, images 75, 74, 73). No associated hemorrhage or mass effect. Findings are likely embolic in nature. Gray-white matter differentiation otherwise maintained. No other areas of chronic cortical infarction. No other foci of susceptibility artifact to suggest acute or chronic intracranial hemorrhage. No mass lesion, midline shift or mass effect. No hydrocephalus or extra-axial fluid collection. Pituitary gland suprasellar region within normal limits. Midline structures intact. Vascular: Major intracranial vascular flow voids are maintained. Skull and  upper cervical spine: Craniocervical junction within normal limits. Bone marrow signal intensity normal. No focal marrow replacing lesion. No scalp soft tissue abnormality. Sinuses/Orbits: Globes and orbital soft tissues within normal limits. Mild scattered mucosal thickening noted within the ethmoidal air cells and maxillary sinuses. Paranasal sinuses are otherwise clear. No significant mastoid effusion. Inner ear structures grossly normal. Other: None. IMPRESSION: 1. 3.4 cm acute ischemic nonhemorrhagic infarct involving the posterior left thalamus/periventricular white matter. 2. Additional scattered subcentimeter cortical and subcortical ischemic infarcts involving the bilateral parieto-occipital regions and right cerebellum. Findings are likely embolic in nature. 3. Underlying mild chronic microvascular ischemic disease. Electronically Signed   By: Jeannine Boga M.D.   On: 10/18/2020 20:55    DG Chest Port 1 View   Result Date: 10/19/2020 CLINICAL DATA:  60 year old  male with left thalamic and other small scattered cerebral infarcts. EXAM: PORTABLE CHEST 1 VIEW COMPARISON:  Chest radiographs 11/05/2019 and earlier. CTA neck 0310 hours today. FINDINGS: Portable AP upright view at 1027 hours. Lung volumes and mediastinal contours remain normal. Visualized tracheal air column is within normal limits. Allowing for portable technique the lungs are clear. No pneumothorax or pleural effusion. Paucity of bowel gas in the upper abdomen. No acute osseous abnormality identified. IMPRESSION: Negative portable chest. Electronically Signed   By: Genevie Ann M.D.   On: 10/19/2020 10:41    VAS [place] LOWER EXTREMITY VENOUS (DVT)   Result Date: 10/19/2020  Lower Venous DVT Study Indications: Stroke.  Comparison Study: No prior study on file Performing Technologist: Sharion Dove RVS  Examination Guidelines: A complete evaluation includes B-mode imaging, spectral Doppler, color Doppler, and power Doppler as needed of all  accessible portions of each vessel. Bilateral testing is considered an integral part of a complete examination. Limited examinations for reoccurring indications may be performed as noted. The reflux portion of the exam is performed with the patient in reverse Trendelenburg.  +---------+---------------+---------+-----------+----------+--------------+ RIGHT    CompressibilityPhasicitySpontaneityPropertiesThrombus Aging +---------+---------------+---------+-----------+----------+--------------+ CFV      Full           Yes      Yes                                 +---------+---------------+---------+-----------+----------+--------------+ SFJ      Full                                                        +---------+---------------+---------+-----------+----------+--------------+ FV Prox  Full                                                        +---------+---------------+---------+-----------+----------+--------------+ FV Mid   Full                                                        +---------+---------------+---------+-----------+----------+--------------+ FV DistalFull                                                        +---------+---------------+---------+-----------+----------+--------------+ PFV      Full                                                        +---------+---------------+---------+-----------+----------+--------------+ POP      Full           Yes      Yes                                 +---------+---------------+---------+-----------+----------+--------------+  PTV      Full                                                        +---------+---------------+---------+-----------+----------+--------------+ PERO     Full                                                        +---------+---------------+---------+-----------+----------+--------------+   +---------+---------------+---------+-----------+----------+--------------+  LEFT     CompressibilityPhasicitySpontaneityPropertiesThrombus Aging +---------+---------------+---------+-----------+----------+--------------+ CFV      Full           Yes      Yes                                 +---------+---------------+---------+-----------+----------+--------------+ SFJ      Full                                                        +---------+---------------+---------+-----------+----------+--------------+ FV Prox  Full                                                        +---------+---------------+---------+-----------+----------+--------------+ FV Mid   Full                                                        +---------+---------------+---------+-----------+----------+--------------+ FV DistalFull                                                        +---------+---------------+---------+-----------+----------+--------------+ PFV      Full                                                        +---------+---------------+---------+-----------+----------+--------------+ POP      Full           Yes      Yes                                 +---------+---------------+---------+-----------+----------+--------------+ PTV      Full                                                        +---------+---------------+---------+-----------+----------+--------------+  PERO     Full                                                        +---------+---------------+---------+-----------+----------+--------------+     Summary: BILATERAL: - No evidence of deep vein thrombosis seen in the lower extremities, bilaterally. -   *See table(s) above for measurements and observations.    Preliminary          Assessment/Plan: Diagnosis: Right cerebellar and left thalamic infarct with reduced balance and mobility as well as ADLs 1. Does the need for close, 24 hr/day medical supervision in concert with the patient's rehab needs make it  unreasonable for this patient to be served in a less intensive setting? Yes 2. Co-Morbidities requiring supervision/potential complications: Coronary artery disease, diabetes 3. Due to bladder management, bowel management, safety, skin/wound care, disease management, medication administration, pain management and patient education, does the patient require 24 hr/day rehab nursing? Yes 4. Does the patient require coordinated care of a physician, rehab nurse, therapy disciplines of PT, OT to address physical and functional deficits in the context of the above medical diagnosis(es)? Yes Addressing deficits in the following areas: balance, endurance, locomotion, strength, transferring, bowel/bladder control, bathing, dressing, toileting and psychosocial support 5. Can the patient actively participate in an intensive therapy program of at least 3 hrs of therapy per day at least 5 days per week? Yes 6. The potential for patient to make measurable gains while on inpatient rehab is excellent 7. Anticipated functional outcomes upon discharge from inpatient rehab are modified independent  with PT, modified independent with OT, n/a with SLP. 8. Estimated rehab length of stay to reach the above functional goals is: 7 days 9. Anticipated discharge destination: Home 10. Overall Rehab/Functional Prognosis: excellent   RECOMMENDATIONS: This patient's condition is appropriate for continued rehabilitative care in the following setting: CIR Patient has agreed to participate in recommended program. Yes Note that insurance prior authorization may be required for reimbursement for recommended care.   Comment: Reportedly lives alone was working prior to admission     Elizabeth Sauer 10/19/2020          Revision History                        Routing History                   Note Details  Author Charlett Blake, MD File Time 10/20/2020  1:39 PM  Author Type Physician Status Signed   Last Editor Charlett Blake, MD Service Physical [facility] [place] # [phone] Admit Date 10/21/2020

## 2020-10-21 NOTE — Progress Notes (Signed)
Patient arrived to the unit via wheelchair with RN Lennette Bihari. Patient oriented to room and call light system. Admission completed, non slip socks applied to feet, bed locked and in lowest position. Patient educated about unit, diet, therapy, rules of unit, and significant other also.

## 2020-10-21 NOTE — H&P (Signed)
Physical Medicine and Rehabilitation Admission H&P        Chief Complaint  Patient presents with  . Fall  . Altered Mental Status  : HPI: [name] is a 60 year old right-handed male with history of CAD/STEMI maintained on aspirin, tobacco abuse, obesity with BMI 36.32, diabetes mellitus, chronic systolic congestive heart failure, anemia and colon cancer.  Per chart review lives alone.  Independent prior to admission.  Presented 10/18/2020 with balance difficulties x2 days and noted fall without loss of consciousness.  He denied any weakness or double vision.  Patient initial MRI at [facility] demonstrated multifocal embolic appearing strokes in the posterior circulation including the right cerebellum and left thalamus, periventricular white matter.  Patient did not receive TPA.  CT angiogram of head and neck negative CTA for large vessel occlusion.  50% atheromatous stenosis about the left carotid bulb/proximal left ICA.  Bilateral lower extremity Dopplers negative.  Echocardiogram with ejection fraction of 55 to 08% grade 2 diastolic dysfunction.  Admission chemistries unremarkable except glucose 184, urinalysis negative, hemoglobin A1c 8.7, urine drug screen positive benzos, troponin negative.  Presently maintained on Brilinta for CVA prophylaxis as well as low-dose aspirin.  Patient was scheduled for TEE showed no cardiac source of embolus without thrombus negative for PFO and loop recorder was placed 10/21/2020.  Therapy evaluations completed due to patient decreased functional mobility was admitted for a comprehensive rehab program.   Review of Systems  Constitutional: Negative for chills and fever.  HENT: Negative for hearing loss.   Eyes: Negative for blurred vision and double vision.  Respiratory: Negative for cough and shortness of breath.   Cardiovascular: Positive for palpitations.  Gastrointestinal: Positive for constipation. Negative for heartburn, nausea and  vomiting.  Genitourinary: Negative for dysuria, flank pain and hematuria.  Musculoskeletal: Positive for joint pain and myalgias.  Skin: Negative for rash.  Neurological: Positive for dizziness and weakness.  Psychiatric/Behavioral: Positive for depression. The patient has insomnia.        Anxiety  All other systems reviewed and are negative.       Past Medical History:  Diagnosis Date  . Anemia 10/03/2009    Qualifier: Diagnosis of  By: [name]   . Anxiety associated with depression 10/11/2013  . Colon cancer ([place])      right colon cancer- adenocarcinoma CEA level isnrmal at 1.8  . Diabetes mellitus    . Diabetes mellitus type 2 in obese [facility]) 03/20/2010    Qualifier: Diagnosis of  By: [name]    . Erectile dysfunction 06/22/2016  . Essential hypertension 06/05/2014  . FATTY LIVER DISEASE 11/04/2009    Qualifier: Diagnosis of  By: [name] MD, Docia Chuck   Qualifier: Diagnosis of  By: [name] MD, Docia Chuck  Last Assessment & Plan:  conirmed by CT scan of abdomen in April 2016 encouraged to minimize simple carbs and fatty foods.  Marland Kitchen GERD 11/04/2009    Qualifier: Diagnosis of  By: [name] MD, Docia Chuck   . Great toe pain, right 04/18/2017  . Hepatic artery stenosis ([place])    . History of colon cancer 01/02/2010    Qualifier: Diagnosis of  By: [name] Dr [name]  Last Assessment & Plan:  Follows closely with gastroenterology, Dr [name]  . Hyperlipidemia    . Hyperlipidemia, mixed 09/07/2010    Qualifier: Diagnosis of  By: [name]   . Hypertension    . Hypertriglyceridemia  12/14/2010  . Hypotestosteronism 06/22/2016  . INSOMNIA, CHRONIC 03/20/2010    Qualifier: Diagnosis of  By: [name] Ambien 10 mg daily does not keep asleep AdvilPM is over sedating and has trouble waking up   . Iron deficiency anemia 2011  . Low testosterone 06/22/2016  . Malignant neoplasm of colon ([place]) 01/02/2010    Qualifier: Diagnosis of  By: [name] Dr [name]   . Muscle spasm of  back 06/05/2014  . Nipple pain 09/30/2016  . Obesity    . Palpitations 06/05/2014  . Preventative health care 10/08/2012  . Psoriasis    . Psoriatic arthritis ([place]) 08/25/2017  . Right knee pain 11/17/2011         Past Surgical History:  Procedure Laterality Date  . CARPAL TUNNEL RELEASE        RIGHT  . CHOLECYSTECTOMY   1994  . COLONOSCOPY      . CORONARY/GRAFT ACUTE MI REVASCULARIZATION N/A 10/27/2019    Procedure: Coronary/Graft Acute MI Revascularization;  Surgeon: [name], MD;  Location: [place] [facility];  Service: Cardiovascular;  Laterality: N/A;  . HEMICOLECTOMY        12/17/2009 right  . LEFT HEART CATH AND CORONARY ANGIOGRAPHY N/A 10/27/2019    Procedure: LEFT HEART CATH AND CORONARY ANGIOGRAPHY;  Surgeon: [name], MD;  Location: [place] [facility];  Service: Cardiovascular;  Laterality: N/A;  . POLYPECTOMY             Family History  Problem Relation Age of Onset  . Stomach cancer Mother          diedinher 94's  . Diabetes type II Brother          boderline  . Diabetes Brother          type II  . Alcohol abuse Brother    . Other Neg Hx          cad,prostate ca, colon ca  . Coronary artery disease Neg Hx    . Cancer Neg Hx          colon, prostate  . Colon cancer Neg Hx    . Esophageal cancer Neg Hx    . Rectal cancer Neg Hx      Social History:  reports that he quit smoking about a year ago. His smoking use included cigarettes. He has never used smokeless tobacco. He reports that he does not drink alcohol and does not use drugs. Allergies:       Allergies  Allergen Reactions  . Statins        Causes muscle weakness     Medications Prior to Admission  Medication Sig Dispense Refill  . aspirin EC 81 MG tablet Take 1 tablet (81 mg total) by mouth daily. 90 tablet 3  . clobetasol cream (TEMOVATE) 1.74 % Apply 1 application topically at bedtime as needed (psoriasis).      . diazepam (VALIUM) 5 MG tablet Take 1 tablet (5 mg total) by mouth  daily as needed for anxiety. 30 tablet 2  . diclofenac (VOLTAREN) 75 MG EC tablet TAKE 1 TABLET (75 MG TOTAL) BY MOUTH 2 (TWO) TIMES DAILY AS NEEDED. (Patient taking differently: Take 75 mg by mouth 2 (two) times daily as needed for moderate pain.) 60 tablet 1  . folic acid (FOLVITE) 1 MG tablet Take 1 mg by mouth daily.       . insulin glargine (LANTUS SOLOSTAR) 100 UNIT/ML Solostar Pen Inject 60  Units into the skin daily. 15 mL 3  . insulin lispro (HUMALOG KWIKPEN) 100 UNIT/ML KwikPen Inject 15 Units into the skin 3 (three) times daily. 15 mL 3  . Ixekizumab 80 MG/ML SOSY Inject 80 mg into the skin every 28 (twenty-eight) days.      Marland Kitchen losartan (COZAAR) 25 MG tablet Take 1 tablet (25 mg total) by mouth daily. 90 tablet 3  . methotrexate (RHEUMATREX) 2.5 MG tablet Take 2.5 mg by mouth as needed (psoriasis).      . predniSONE (DELTASONE) 5 MG tablet Take 1 tablet by mouth daily as needed (psoriasis).      . Blood Glucose Monitoring Suppl (CONTOUR NEXT MONITOR) w/Device KIT Use to check blood sugar tid.  Dx code: E11.9 1 kit 0  . BRILINTA 90 MG TABS tablet TAKE 1 TABLET(90 MG) BY MOUTH TWICE DAILY (Patient not taking: Reported on 10/19/2020) 180 tablet 3  . glucose blood (CONTOUR NEXT TEST) test strip Use to check blood sugar tid.  Dx code: E11.9 300 each 1  . HYDROcodone-acetaminophen (NORCO) 5-325 MG tablet Take 1 tablet by mouth every 6 (six) hours as needed for moderate pain or severe pain. (Patient not taking: No sig reported) 30 tablet 0  . Insulin Pen Needle (B-D ULTRAFINE III SHORT PEN) 31G X 8 MM MISC Test as directed three times daily.  DX E11.9 300 each 3  . metoprolol succinate (TOPROL XL) 25 MG 24 hr tablet Take 1 tablet (25 mg total) by mouth daily. 90 tablet 3  . Microlet Lancets MISC Use to check blood sugar tid.  Dx code: E11.9 300 each 1  . rosuvastatin (CRESTOR) 10 MG tablet Take 1 tablet (10 mg total) by mouth daily. (Patient not taking: No sig reported) 90 tablet 3  .  sulfamethoxazole-trimethoprim (BACTRIM DS) 800-160 MG tablet Take 1 tablet by mouth 2 (two) times daily. (Patient not taking: No sig reported) 20 tablet 0  . zolpidem (AMBIEN) 10 MG tablet TAKE 1 TABLET(10 MG) BY MOUTH AT BEDTIME AS NEEDED (Patient not taking: No sig reported) 15 tablet 5      Drug Regimen Review Drug regimen was reviewed and remains appropriate with no significant issues identified   Home: Home Living Family/patient expects to be discharged to:: Private residence Living Arrangements: Alone Available Help at Discharge: Friend(s),Available PRN/intermittently Landscape architect, close friends; can be checked on daily) Type of Home: House Home Access: Stairs to enter Technical brewer of Steps: 1 Bathroom Shower/Tub: Multimedia programmer: Standard Home Equipment: Media planner - 2 wheels,Cane - quad   Functional History: Prior Function Level of Independence: Independent Comments: I with ADLs/IADLs; driving; cooking/cleaning; working   Functional Status:  Mobility: Bed Mobility Overal bed mobility: Needs Assistance Bed Mobility: Supine to Sit Supine to sit: Supervision Sit to supine: Supervision,HOB elevated General bed mobility comments: Increased time to transition to EOB. HOB flat and no use of rails to simulate home environment. Attempting to utilize momentum to get trunk elevated. Transfers Overall transfer level: Needs assistance Equipment used: None,Rolling walker (2 wheeled) Transfers: Sit to/from Stand Sit to Stand: Supervision,Min guard,From elevated surface Stand pivot transfers: Min guard,Min assist General transfer comment: Attempting to stand without UE use to "show" therapist he could do it. Very unsteady and without a smooth transition to standing. Light min assist required. Ambulation/Gait Ambulation/Gait assistance: Min assist,Min guard Gait Distance (Feet): 200 Feet Assistive device: None,Rolling walker (2 wheeled) Gait  Pattern/deviations: Wide base of support,Decreased step length - right,Decreased step length - left,Decreased  stride length General Gait Details: Wide BOS with ambulation without an AD. With RW, pt able to improve overall gait pattern but with occasional unsteadiness and need for assist, more so with higher level balance activity. Gait velocity: Decreased Gait velocity interpretation: <1.8 ft/sec, indicate of risk for recurrent falls Stairs: Yes Stairs assistance: Min guard,Mod assist Stair Management: Two rails,No rails,Forwards Number of Stairs: 5 General stair comments: Attempted stairs without rails, again to "show" therapist he was able to do it, and required mod assist for balance support and safety. With 2 rails, min guard assist and increased time required.   ADL: ADL Overall ADL's : Needs assistance/impaired Grooming: Oral care,Standing,Supervision/safety Grooming Details (indicate cue type and reason): Handwashing standing at sink level with Min guard. No AD. Upper Body Dressing : Supervision/safety,Set up,Sitting Upper Body Dressing Details (indicate cue type and reason): to don back side cover Lower Body Dressing: Supervision/safety,Sitting/lateral leans Lower Body Dressing Details (indicate cue type and reason): simulated from recliner with pt adjusting socks Toilet Transfer: Minimal assistance,RW,Ambulation Toilet Transfer Details (indicate cue type and reason): simulated via functional mobiltiy with and without AD with pts balance improving with use of AD Functional mobility during ADLs: Minimal assistance,Rolling walker General ADL Comments: pt continues to present with balance deficits but very motivated to work with therapies and return to [facility]   Cognition: Cognition Overall Cognitive Status: Impaired/Different from baseline Orientation Level: Oriented X4 Attention: Selective Selective Attention: Appears intact Memory: Impaired Memory Impairment: Storage  deficit Awareness:  (unable to assess) Cognition Arousal/Alertness: Awake/alert Behavior During Therapy: Restless Overall Cognitive Status: Impaired/Different from baseline Area of Impairment: Memory,Safety/judgement Memory: Decreased short-term memory Safety/Judgement: Decreased awareness of safety,Decreased awareness of deficits Problem Solving: Slow processing General Comments: Very inquisitive, however not remembering answers to his many questions.   Physical Exam: Blood pressure 127/82, pulse 85, temperature 98 F (36.7 C), temperature source Oral, resp. rate 18, height $RemoveBe'5\' 8"'nxLBgtwMH$  (1.727 m), weight 108.4 kg, SpO2 100 %. Physical Exam Constitutional:      Appearance: He is obese.  HENT:     Head: Normocephalic.     Nose: Nose normal.     Mouth/Throat:     Mouth: Mucous membranes are moist.     Pharynx: Oropharynx is clear.  Eyes:     Extraocular Movements: Extraocular movements intact.     Conjunctiva/sclera: Conjunctivae normal.     Pupils: Pupils are equal, round, and reactive to light.  Cardiovascular:     Rate and Rhythm: Normal rate and regular rhythm.     Heart sounds: No murmur heard. No gallop.   Pulmonary:     Effort: Pulmonary effort is normal. No respiratory distress.     Breath sounds: No wheezing.  Abdominal:     General: There is no distension.     Palpations: Abdomen is soft.     Tenderness: There is no abdominal tenderness.  Musculoskeletal:        General: No swelling or tenderness.     Cervical back: Normal range of motion.  Skin:    General: Skin is warm.  Neurological:     Mental Status: He is alert.     Comments: Alert and oriented x 3. Normal insight and awareness. Intact Memory. Normal language and speech. Cranial nerve exam unremarkable although pt did complain of mild diplopia. Mild right sided limb ataxia and dysmetria. RUE 4+/5. LUE 5/5. RLE 4/5. LLE 4+/5. No focal sensory findings  Psychiatric:        Behavior: Behavior normal.  Thought Content: Thought content normal.        Lab Results Last 48 Hours        Results for orders placed or performed during the hospital encounter of 10/18/20 (from the past 48 hour(s))  Urine rapid drug screen (hosp performed)     Status: Abnormal    Collection Time: 10/19/20  8:00 AM  Result Value Ref Range    Opiates NONE DETECTED NONE DETECTED    Cocaine NONE DETECTED NONE DETECTED    Benzodiazepines POSITIVE (A) NONE DETECTED    Amphetamines NONE DETECTED NONE DETECTED    Tetrahydrocannabinol NONE DETECTED NONE DETECTED    Barbiturates NONE DETECTED NONE DETECTED      Comment: (NOTE) DRUG SCREEN FOR MEDICAL PURPOSES ONLY.  IF CONFIRMATION IS NEEDED FOR ANY PURPOSE, NOTIFY LAB WITHIN 5 DAYS.   LOWEST DETECTABLE LIMITS FOR URINE DRUG SCREEN Drug Class                     Cutoff (ng/mL) Amphetamine and metabolites    1000 Barbiturate and metabolites    200 Benzodiazepine                 200 Tricyclics and metabolites     300 Opiates and metabolites        300 Cocaine and metabolites        300 THC                            50 Performed at [facility], [address], [place], [place] [address]    Glucose, capillary     Status: Abnormal    Collection Time: 10/19/20  8:32 AM  Result Value Ref Range    Glucose-Capillary 145 (H) 70 - 99 mg/dL      Comment: Glucose reference range applies only to samples taken after fasting for at least 8 hours.  Glucose, capillary     Status: Abnormal    Collection Time: 10/19/20 12:14 PM  Result Value Ref Range    Glucose-Capillary 223 (H) 70 - 99 mg/dL      Comment: Glucose reference range applies only to samples taken after fasting for at least 8 hours.  Resp Panel by RT-PCR (Flu A&B, Covid) Nasopharyngeal Swab     Status: None    Collection Time: 10/19/20  1:35 PM    Specimen: Nasopharyngeal Swab; Nasopharyngeal(NP) swabs in vial transport medium  Result Value Ref Range    SARS Coronavirus 2 by RT PCR NEGATIVE NEGATIVE       Comment: (NOTE) SARS-CoV-2 target nucleic acids are NOT DETECTED.   The SARS-CoV-2 RNA is generally detectable in upper respiratory specimens during the acute phase of infection. The lowest concentration of SARS-CoV-2 viral copies this assay can detect is 138 copies/mL. A negative result does not preclude SARS-Cov-2 infection and should not be used as the sole basis for treatment or other patient management decisions. A negative result may occur with  improper specimen collection/handling, submission of specimen other than nasopharyngeal swab, presence of viral mutation(s) within the areas targeted by this assay, and inadequate number of viral copies(<138 copies/mL). A negative result must be combined with clinical observations, patient history, and epidemiological information. The expected result is Negative.   Fact Sheet for Patients:  BloggerCourse.com   Fact Sheet for Healthcare Providers:  SeriousBroker.it   This test is no t yet approved or cleared by the [place] and  has been authorized for detection and/or diagnosis of SARS-CoV-2 by FDA under an Emergency Use Authorization (EUA). This EUA will remain  in effect (meaning this test can be used) for the duration of the COVID-19 declaration under Section 564(b)(1) of the Act, 21 U.S.C.section 360bbb-3(b)(1), unless the authorization is terminated  or revoked sooner.           Influenza A by PCR NEGATIVE NEGATIVE    Influenza B by PCR NEGATIVE NEGATIVE      Comment: (NOTE) The Xpert Xpress SARS-CoV-2/FLU/RSV plus assay is intended as an aid in the diagnosis of influenza from Nasopharyngeal swab specimens and should not be used as a sole basis for treatment. Nasal washings and aspirates are unacceptable for Xpert Xpress SARS-CoV-2/FLU/RSV testing.   Fact Sheet for Patients: EntrepreneurPulse.com.au   Fact Sheet for Healthcare  Providers: IncredibleEmployment.be   This test is not yet approved or cleared by the [place] FDA and has been authorized for detection and/or diagnosis of SARS-CoV-2 by FDA under an Emergency Use Authorization (EUA). This EUA will remain in effect (meaning this test can be used) for the duration of the COVID-19 declaration under Section 564(b)(1) of the Act, 21 U.S.C. section 360bbb-3(b)(1), unless the authorization is terminated or revoked.   Performed at [facility], [place] [address], [place], [place] [address]    Glucose, capillary     Status: Abnormal    Collection Time: 10/19/20  5:34 PM  Result Value Ref Range    Glucose-Capillary 169 (H) 70 - 99 mg/dL      Comment: Glucose reference range applies only to samples taken after fasting for at least 8 hours.  Glucose, capillary     Status: Abnormal    Collection Time: 10/19/20  8:01 PM  Result Value Ref Range    Glucose-Capillary 195 (H) 70 - 99 mg/dL      Comment: Glucose reference range applies only to samples taken after fasting for at least 8 hours.  Glucose, capillary     Status: Abnormal    Collection Time: 10/19/20 11:03 PM  Result Value Ref Range    Glucose-Capillary 219 (H) 70 - 99 mg/dL      Comment: Glucose reference range applies only to samples taken after fasting for at least 8 hours.  Glucose, capillary     Status: Abnormal    Collection Time: 10/20/20  3:07 AM  Result Value Ref Range    Glucose-Capillary 151 (H) 70 - 99 mg/dL      Comment: Glucose reference range applies only to samples taken after fasting for at least 8 hours.  Glucose, capillary     Status: Abnormal    Collection Time: 10/20/20  4:57 AM  Result Value Ref Range    Glucose-Capillary 183 (H) 70 - 99 mg/dL      Comment: Glucose reference range applies only to samples taken after fasting for at least 8 hours.  TSH     Status: None    Collection Time: 10/20/20  5:37 AM  Result Value Ref Range    TSH 2.197 0.350 -  4.500 uIU/mL      Comment: Performed by a 3rd Generation assay with a functional sensitivity of <=0.01 uIU/mL. Performed at [facility], [place] [address] [place], [place] [address]    Vitamin B12     Status: None    Collection Time: 10/20/20  5:37 AM  Result Value Ref Range    Vitamin B-12 252 180 - 914 pg/mL  Comment: (NOTE) This assay is not validated for testing neonatal or myeloproliferative syndrome specimens for Vitamin B12 levels. Performed at [facility], [place] [address], [place], [place] [address]    RPR     Status: None    Collection Time: 10/20/20  5:37 AM  Result Value Ref Range    RPR Ser Ql NON REACTIVE NON REACTIVE      Comment: Performed at [facility], [place] [address] [place], [place] [address]  Sedimentation rate     Status: Abnormal    Collection Time: 10/20/20  5:37 AM  Result Value Ref Range    Sed Rate 20 (H) 0 - 16 mm/hr      Comment: Performed at [place] [address] [place], [place] [address]  C-reactive protein     Status: None    Collection Time: 10/20/20  5:37 AM  Result Value Ref Range    CRP <0.5 <1.0 mg/dL      Comment: Performed at [facility], [place] [address], [place], [place] [address]  Glucose, capillary     Status: Abnormal    Collection Time: 10/20/20  7:50 AM  Result Value Ref Range    Glucose-Capillary 156 (H) 70 - 99 mg/dL      Comment: Glucose reference range applies only to samples taken after fasting for at least 8 hours.  Glucose, capillary     Status: Abnormal    Collection Time: 10/20/20 12:17 PM  Result Value Ref Range    Glucose-Capillary 254 (H) 70 - 99 mg/dL      Comment: Glucose reference range applies only to samples taken after fasting for at least 8 hours.  Glucose, capillary     Status: Abnormal    Collection Time: 10/20/20  4:11 PM  Result Value Ref Range    Glucose-Capillary 173 (H) 70 - 99 mg/dL      Comment: Glucose reference range applies only to samples taken after  fasting for at least 8 hours.  Glucose, capillary     Status: Abnormal    Collection Time: 10/20/20  8:44 PM  Result Value Ref Range    Glucose-Capillary 206 (H) 70 - 99 mg/dL      Comment: Glucose reference range applies only to samples taken after fasting for at least 8 hours.  Glucose, capillary     Status: Abnormal    Collection Time: 10/21/20 12:10 AM  Result Value Ref Range    Glucose-Capillary 142 (H) 70 - 99 mg/dL      Comment: Glucose reference range applies only to samples taken after fasting for at least 8 hours.  Glucose, capillary     Status: Abnormal    Collection Time: 10/21/20  4:20 AM  Result Value Ref Range    Glucose-Capillary 141 (H) 70 - 99 mg/dL      Comment: Glucose reference range applies only to samples taken after fasting for at least 8 hours.       Imaging Results (Last 48 hours)  DG Chest Port 1 View   Result Date: 10/19/2020 CLINICAL DATA:  60 year old male with left thalamic and other small scattered cerebral infarcts. EXAM: PORTABLE CHEST 1 VIEW COMPARISON:  Chest radiographs 11/05/2019 and earlier. CTA neck 0310 hours today. FINDINGS: Portable AP upright view at 1027 hours. Lung volumes and mediastinal contours remain normal. Visualized tracheal air column is within normal limits. Allowing for portable technique the lungs are clear. No pneumothorax or pleural effusion. Paucity of  bowel gas in the upper abdomen. No acute osseous abnormality identified. IMPRESSION: Negative portable chest. Electronically Signed   By: Genevie Ann M.D.   On: 10/19/2020 10:41    ECHOCARDIOGRAM COMPLETE   Result Date: 10/19/2020    ECHOCARDIOGRAM REPORT   Patient Name:   WILBERTO CONSOLE Date of Exam: 10/19/2020 Medical Rec #:  [id number]      Height:       69.0 in Accession #:    [id number]     Weight:       250.5 lb Date of Birth:  1960-09-05      BSA:          2.273 m Patient Age:    68 years       BP:           120/66 mmHg Patient Gender: M              HR:           95 bpm. Exam  Location:  Inpatient Procedure: 2D Echo, Cardiac Doppler, Color Doppler and Intracardiac            Opacification Agent Indications:    Stroke I63.9  History:        Patient has prior history of Echocardiogram examinations, most                 recent 10/28/2019. Risk Factors:Hypertension, Dyslipidemia and                 Diabetes. Cancer. GERD.  Sonographer:    Jonelle Sidle Dance Referring Phys: [id number]  1. Left ventricular ejection fraction, by estimation, is 55 to 60%. The left ventricle has normal function. The left ventricle demonstrates regional wall motion abnormalities (see scoring diagram/findings for description). There is mild left ventricular  hypertrophy. Left ventricular diastolic parameters are consistent with Grade II diastolic dysfunction (pseudonormalization).  2. Right ventricular systolic function is normal. The right ventricular size is normal.  3. Left atrial size was moderately dilated.  4. The mitral valve is normal in structure. No evidence of mitral valve regurgitation. No evidence of mitral stenosis.  5. The aortic valve is normal in structure. Aortic valve regurgitation is not visualized. No aortic stenosis is present.  6. The inferior vena cava is normal in size with greater than 50% respiratory variability, suggesting right atrial pressure of 3 mmHg. Conclusion(s)/Recommendation(s): No intracardiac source of embolism detected on this transthoracic study. A transesophageal echocardiogram is recommended to exclude cardiac source of embolism if clinically indicated. FINDINGS  Left Ventricle: Left ventricular ejection fraction, by estimation, is 55 to 60%. The left ventricle has normal function. The left ventricle demonstrates regional wall motion abnormalities. Definity contrast agent was given IV to delineate the left ventricular endocardial borders. The left ventricular internal cavity size was normal in size. There is mild left ventricular hypertrophy. Left  ventricular diastolic parameters are consistent with Grade II diastolic dysfunction (pseudonormalization).  LV Wall Scoring: The apical septal segment and apex are akinetic. Right Ventricle: The right ventricular size is normal. No increase in right ventricular wall thickness. Right ventricular systolic function is normal. Left Atrium: Left atrial size was moderately dilated. Right Atrium: Right atrial size was normal in size. Pericardium: There is no evidence of pericardial effusion. Mitral Valve: The mitral valve is normal in structure. Mild mitral annular calcification. No evidence of mitral valve regurgitation. No evidence of mitral valve stenosis. Tricuspid Valve: The tricuspid valve is normal in structure. Tricuspid valve regurgitation  is not demonstrated. No evidence of tricuspid stenosis. Aortic Valve: The aortic valve is normal in structure. Aortic valve regurgitation is not visualized. No aortic stenosis is present. Pulmonic Valve: The pulmonic valve was normal in structure. Pulmonic valve regurgitation is not visualized. No evidence of pulmonic stenosis. Aorta: The aortic root is normal in size and structure. Venous: The inferior vena cava is normal in size with greater than 50% respiratory variability, suggesting right atrial pressure of 3 mmHg. IAS/Shunts: No atrial level shunt detected by color flow Doppler.  LEFT VENTRICLE PLAX 2D LVIDd:         4.60 cm  Diastology LVIDs:         3.30 cm  LV e' medial:    8.92 cm/s LV PW:         1.30 cm  LV E/e' medial:  7.8 LV IVS:        1.10 cm  LV e' lateral:   9.90 cm/s LVOT diam:     1.90 cm  LV E/e' lateral: 7.0 LV SV:         52 LV SV Index:   23 LVOT Area:     2.84 cm  RIGHT VENTRICLE             IVC RV Basal diam:  3.10 cm     IVC diam: 1.80 cm RV Mid diam:    1.70 cm RV S prime:     13.40 cm/s TAPSE (M-mode): 1.9 cm LEFT ATRIUM              Index       RIGHT ATRIUM           Index LA diam:        4.70 cm  2.07 cm/m  RA Area:     15.00 cm LA Vol (A2C):    111.0 ml 48.82 ml/m RA Volume:   31.70 ml  13.94 ml/m LA Vol (A4C):   82.4 ml  36.24 ml/m LA Biplane Vol: 97.3 ml  42.80 ml/m  AORTIC VALVE LVOT Vmax:   94.60 cm/s LVOT Vmean:  62.900 cm/s LVOT VTI:    0.183 m  AORTA Ao Root diam: 3.70 cm Ao Asc diam:  3.10 cm MITRAL VALVE MV Area (PHT): 3.91 cm    SHUNTS MV Decel Time: 194 msec    Systemic VTI:  0.18 m MV E velocity: 69.40 cm/s  Systemic Diam: 1.90 cm MV A velocity: 55.70 cm/s MV E/A ratio:  1.25 Candee Furbish MD Electronically signed by Candee Furbish MD Signature Date/Time: 10/19/2020/4:45:27 PM    Final     VAS [place] LOWER EXTREMITY VENOUS (DVT)   Result Date: 10/19/2020  Lower Venous DVT Study Indications: Stroke.  Comparison Study: No prior study on file Performing Technologist: Sharion Dove RVS  Examination Guidelines: A complete evaluation includes B-mode imaging, spectral Doppler, color Doppler, and power Doppler as needed of all accessible portions of each vessel. Bilateral testing is considered an integral part of a complete examination. Limited examinations for reoccurring indications may be performed as noted. The reflux portion of the exam is performed with the patient in reverse Trendelenburg.  +---------+---------------+---------+-----------+----------+--------------+ RIGHT    CompressibilityPhasicitySpontaneityPropertiesThrombus Aging +---------+---------------+---------+-----------+----------+--------------+ CFV      Full           Yes      Yes                                 +---------+---------------+---------+-----------+----------+--------------+  SFJ      Full                                                        +---------+---------------+---------+-----------+----------+--------------+ FV Prox  Full                                                        +---------+---------------+---------+-----------+----------+--------------+ FV Mid   Full                                                         +---------+---------------+---------+-----------+----------+--------------+ FV DistalFull                                                        +---------+---------------+---------+-----------+----------+--------------+ PFV      Full                                                        +---------+---------------+---------+-----------+----------+--------------+ POP      Full           Yes      Yes                                 +---------+---------------+---------+-----------+----------+--------------+ PTV      Full                                                        +---------+---------------+---------+-----------+----------+--------------+ PERO     Full                                                        +---------+---------------+---------+-----------+----------+--------------+   +---------+---------------+---------+-----------+----------+--------------+ LEFT     CompressibilityPhasicitySpontaneityPropertiesThrombus Aging +---------+---------------+---------+-----------+----------+--------------+ CFV      Full           Yes      Yes                                 +---------+---------------+---------+-----------+----------+--------------+ SFJ      Full                                                        +---------+---------------+---------+-----------+----------+--------------+   FV Prox  Full                                                        +---------+---------------+---------+-----------+----------+--------------+ FV Mid   Full                                                        +---------+---------------+---------+-----------+----------+--------------+ FV DistalFull                                                        +---------+---------------+---------+-----------+----------+--------------+ PFV      Full                                                         +---------+---------------+---------+-----------+----------+--------------+ POP      Full           Yes      Yes                                 +---------+---------------+---------+-----------+----------+--------------+ PTV      Full                                                        +---------+---------------+---------+-----------+----------+--------------+ PERO     Full                                                        +---------+---------------+---------+-----------+----------+--------------+     Summary: BILATERAL: - No evidence of deep vein thrombosis seen in the lower extremities, bilaterally. -   *See table(s) above for measurements and observations. Electronically signed by Monica Martinez MD on 10/19/2020 at 4:23:09 PM.    Final              Medical Problem List and Plan: 1.  Abnormal gait pattern with gait balance difficulty secondary to left thalamic and posterior CR, bilateral occipital punctate, right cerebellum small infarcts, embolic pattern, cryptogenic origin             -patient may shower             -ELOS/Goals: 7-10 days, Mod I goals with PT, OT 2.  Antithrombotics: -DVT/anticoagulation: SCDs             -antiplatelet therapy: Aspirin 81 mg daily and Brilinta 90 mg twice daily 3. Pain Management: Tylenol as needed 4. Mood: Valium 5 mg daily as needed anxiety             -  antipsychotic agents: N/A 5. Neuropsych: This patient is capable of making decisions on his own behalf. 6. Skin/Wound Care: Routine skin checks 7. Fluids/Electrolytes/Nutrition: Routine in and outs with follow-up chemistries             -pt requested dietary consult for healthier eating habits 8.  Hypertension.  Cozaar 12.5 mg daily.  Monitor with increased mobility 9.  Diabetes mellitus with peripheral neuropathy.  Hemoglobin A1c 8.7.  Lantus insulin 40 units daily.               -Check blood sugars before meals and at bedtime.               -Diabetic teaching 10.   Hyperlipidemia.  Pravachol 11.  History of CAD with STEMI, continue aspirin 12.  Obesity.  BMI 36.32.  Dietary follow-up 13.  Chronic systolic congestive heart failure.  Monitor for signs of fluid overload.             -check daily weights.  14.  Tobacco abuse.  Counseling 15.  History of colon cancer.  Follow-up outpatient follow-up Dr. [name]        I have personally performed a face to face diagnostic evaluation of this patient and formulated the key components of the plan.  Additionally, I have personally reviewed laboratory data, imaging studies, as well as relevant notes and concur with the physician assistant's documentation above.  The patient's status has not changed from the original H&P.  Any changes in documentation from the acute care chart have been noted above.  Meredith Staggers, MD, FAAPMR    Lavon Paganini Benjamin, PA-C 10/21/2020

## 2020-10-21 NOTE — Consult Note (Addendum)
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 Accession #:    [id number]     Weight:       250.5 lb Date of Birth:  1961-05-17      BSA:          2.273 m Patient Age:    31 years       BP:           120/66 mmHg Patient Gender: M              HR:           95 bpm. Exam Location:  Inpatient Procedure: 2D Echo, Cardiac Doppler, Color Doppler and Intracardiac            Opacification Agent Indications:    Stroke I63.9  History:        Patient has prior history of Echocardiogram examinations, most                 recent 10/28/2019. Risk Factors:Hypertension, Dyslipidemia and                 Diabetes. Cancer. GERD.  Sonographer:    Jonelle Sidle Dance Referring Phys: [id number] [place]  1. Left ventricular ejection fraction, by estimation, is 55 to 60%. The left ventricle has normal function.  The left ventricle demonstrates regional wall motion abnormalities (see scoring diagram/findings for description). There is mild left ventricular  hypertrophy. Left ventricular diastolic parameters are consistent with Grade II diastolic dysfunction (pseudonormalization).  2. Right ventricular systolic function is normal. The right ventricular size is normal.  3. Left atrial size was moderately dilated.  4. The mitral valve is normal in  structure. No evidence of mitral valve regurgitation. No evidence of mitral stenosis.  5. The aortic valve is normal in structure. Aortic valve regurgitation is not visualized. No aortic stenosis is present.  6. The inferior vena cava is normal in size with greater than 50% respiratory variability, suggesting right atrial pressure of 3 mmHg. Conclusion(s)/Recommendation(s): No intracardiac source of embolism detected on this transthoracic study. A transesophageal echocardiogram is recommended to exclude cardiac source of embolism if clinically indicated. FINDINGS  Left Ventricle: Left ventricular ejection fraction, by estimation, is 55 to 60%. The left ventricle has normal function. The left ventricle demonstrates regional wall motion abnormalities. Definity contrast agent was given IV to delineate the left ventricular endocardial borders. The left ventricular internal cavity size was normal in size. There is mild left ventricular hypertrophy. Left ventricular diastolic parameters are consistent with Grade II diastolic dysfunction (pseudonormalization).  LV Wall Scoring: The apical septal segment and apex are akinetic. Right Ventricle: The right ventricular size is normal. No increase in right ventricular wall thickness. Right ventricular systolic function is normal. Left Atrium: Left atrial size was moderately dilated. Right Atrium: Right atrial size was normal in size. Pericardium: There is no evidence of pericardial effusion. Mitral Valve: The mitral valve is normal in structure. Mild mitral annular calcification. No evidence of mitral valve regurgitation. No evidence of mitral valve stenosis. Tricuspid Valve: The tricuspid valve is normal in structure. Tricuspid valve regurgitation is not demonstrated. No evidence of tricuspid stenosis. Aortic Valve: The aortic valve is normal in structure. Aortic valve regurgitation is not visualized. No aortic stenosis is present. Pulmonic Valve: The pulmonic valve was normal  in structure. Pulmonic valve regurgitation is not visualized. No evidence of pulmonic stenosis. Aorta: The aortic root is normal in size and structure. Venous: The inferior vena cava is normal in size with greater than 50% respiratory variability, suggesting right atrial pressure of 3 mmHg. IAS/Shunts: No atrial level shunt detected by color flow Doppler.  LEFT VENTRICLE PLAX 2D LVIDd:         4.60 cm  Diastology LVIDs:         3.30 cm  LV e' medial:    8.92 cm/s LV PW:         1.30 cm  LV E/e' medial:  7.8 LV IVS:        1.10 cm  LV e' lateral:   9.90 cm/s LVOT diam:     1.90 cm  LV E/e' lateral: 7.0 LV SV:         52 LV SV Index:   23 LVOT Area:     2.84 cm  RIGHT VENTRICLE             IVC RV Basal diam:  3.10 cm     IVC diam: 1.80 cm RV Mid diam:    1.70 cm RV S prime:     13.40 cm/s TAPSE (M-mode): 1.9 cm LEFT ATRIUM              Index       RIGHT ATRIUM           Index LA diam:  4.70 cm  2.07 cm/m  RA Area:     15.00 cm LA Vol (A2C):   111.0 ml 48.82 ml/m RA Volume:   31.70 ml  13.94 ml/m LA Vol (A4C):   82.4 ml  36.24 ml/m LA Biplane Vol: 97.3 ml  42.80 ml/m  AORTIC VALVE LVOT Vmax:   94.60 cm/s LVOT Vmean:  62.900 cm/s LVOT VTI:    0.183 m  AORTA Ao Root diam: 3.70 cm Ao Asc diam:  3.10 cm MITRAL VALVE MV Area (PHT): 3.91 cm    SHUNTS MV Decel Time: 194 msec    Systemic VTI:  0.18 m MV E velocity: 69.40 cm/s  Systemic Diam: 1.90 cm MV A velocity: 55.70 cm/s MV E/A ratio:  1.25 Candee Furbish MD Electronically signed by Candee Furbish MD Signature Date/Time: 10/19/2020/4:45:27 PM    Final    VAS [place] LOWER EXTREMITY VENOUS (DVT)  Result Date: 10/19/2020  Lower Venous DVT Study Indications: Stroke.  Comparison Study: No prior study on file Performing Technologist: Sharion Dove RVS  Examination Guidelines: A complete evaluation includes B-mode imaging, spectral Doppler, color Doppler, and power Doppler as needed of all accessible portions of each vessel. Bilateral testing is considered an integral part  of a complete examination. Limited examinations for reoccurring indications may be performed as noted. The reflux portion of the exam is performed with the patient in reverse Trendelenburg.  +---------+---------------+---------+-----------+----------+--------------+ RIGHT    CompressibilityPhasicitySpontaneityPropertiesThrombus Aging +---------+---------------+---------+-----------+----------+--------------+ CFV      Full           Yes      Yes                                 +---------+---------------+---------+-----------+----------+--------------+ SFJ      Full                                                        +---------+---------------+---------+-----------+----------+--------------+ FV Prox  Full                                                        +---------+---------------+---------+-----------+----------+--------------+ FV Mid   Full                                                        +---------+---------------+---------+-----------+----------+--------------+ FV DistalFull                                                        +---------+---------------+---------+-----------+----------+--------------+ PFV      Full                                                        +---------+---------------+---------+-----------+----------+--------------+  POP      Full           Yes      Yes                                 +---------+---------------+---------+-----------+----------+--------------+ PTV      Full                                                        +---------+---------------+---------+-----------+----------+--------------+ PERO     Full                                                        +---------+---------------+---------+-----------+----------+--------------+   +---------+---------------+---------+-----------+----------+--------------+ LEFT     CompressibilityPhasicitySpontaneityPropertiesThrombus Aging  +---------+---------------+---------+-----------+----------+--------------+ CFV      Full           Yes      Yes                                 +---------+---------------+---------+-----------+----------+--------------+ SFJ      Full                                                        +---------+---------------+---------+-----------+----------+--------------+ FV Prox  Full                                                        +---------+---------------+---------+-----------+----------+--------------+ FV Mid   Full                                                        +---------+---------------+---------+-----------+----------+--------------+ FV DistalFull                                                        +---------+---------------+---------+-----------+----------+--------------+ PFV      Full                                                        +---------+---------------+---------+-----------+----------+--------------+ POP      Full           Yes      Yes                                 +---------+---------------+---------+-----------+----------+--------------+  PTV      Full                                                        +---------+---------------+---------+-----------+----------+--------------+ PERO     Full                                                        +---------+---------------+---------+-----------+----------+--------------+     Summary: BILATERAL: - No evidence of deep vein thrombosis seen in the lower extremities, bilaterally. -   *See table(s) above for measurements and observations. Electronically signed by Monica Martinez MD on 10/19/2020 at 4:23:09 PM.    Final     12-lead ECG on arrival EKG showed NSR at 92 bpm  (personally reviewed) All prior EKG's in EPIC reviewed with no documented atrial fibrillation  Telemetry NSR 70-80s (personally reviewed)  Assessment and Plan:  1. Cryptogenic stroke The patient  presents with cryptogenic stroke.  The patient does have a TEE planned for this AM.  I spoke at length with the patient about monitoring for afib with an implantable loop recorder.  Risks, benefits, and alteratives to implantable loop recorder were discussed with the patient today.   At this time, the patient is very clear in their decision to proceed with implantable loop recorder.   2. CAD s/p DES 10/2019 Denies ischemic symptoms EF has improved from last year with revascularization.  Wound care was reviewed with the patient (keep incision clean and dry for 3 days).  Wound check scheduled and entered in AVS. Please call with questions.   Shirley Friar, PA-C 10/21/2020 7:57 AM   I have seen, examined the patient, and reviewed the above assessment and plan.  Changes to above are made where necessary.  On exam, RRR.  I agree with Dr Erlinda Hong that ILR is indicated to evaluate for afib as the cause of stroke.  Risks and benefits to ILR implantation were discussed with the patient who wishes to proceed.  Co Sign: [name], MD 10/21/2020 12:19 PM

## 2020-10-21 NOTE — Transfer of Care (Signed)
Immediate Anesthesia Transfer of Care Note  Patient: [name]  Procedure(s) Performed: TRANSESOPHAGEAL ECHOCARDIOGRAM (TEE) (N/A ) BUBBLE STUDY  Patient Location: Endoscopy Unit  Anesthesia Type:MAC  Level of Consciousness: awake, alert  and oriented  Airway & Oxygen Therapy: Patient Spontanous Breathing and Patient connected to nasal cannula oxygen  Post-op Assessment: Report given to RN and Post -op Vital signs reviewed and stable  Post vital signs: Reviewed and stable  Last Vitals:  Vitals Value Taken Time  BP 127/67 10/21/20 0838  Temp    Pulse 84 10/21/20 0839  Resp 11 10/21/20 0839  SpO2 100 % 10/21/20 0839  Vitals shown include unvalidated device data.  Last Pain:  Vitals:   10/21/20 0731  TempSrc: Temporal  PainSc: 0-No pain      Patients Stated Pain Goal: 0 (53/61/44 [id number])  Complications: No complications documented.

## 2020-10-21 NOTE — PMR Pre-admission (Signed)
PMR Admission Coordinator Pre-Admission Assessment  Patient: [name] is an 60 y.o., male MRN: [id number] DOB: 1961/04/26 Height: 5\' 8"  (172.7 cm) Weight: 113.4 kg              Insurance Information HMO:     PPO:      PCP:      IPA:      80/20:      OTHER:  PRIMARY: UHC/UMR      Policy#: [id number]      Subscriber: pt CM Name:       Phone#: [phone]     Fax#: [phone] Pre-Cert#: [id number]      Employer:  Benefits:  Phone #: [phone]     Name: 4/4 Eff. Date: 03/19/2020     Deduct: $2800      Out of Pocket Max: $5600  CIR: 80%      SNF: 80% Outpatient: 80%     Co-Pay: 20% per medical neccesity Home Health: 80%      Co-Pay: per medical neccesity for visits DME: 80%     Co-Pay: 20% Providers: in network  SECONDARY: none  Financial Counselor:       Phone#:   [employer] Engineer, [employer]" for patients in Inpatient Rehabilitation Facilities with attached "Privacy Act [place] Records" was provided and verbally reviewed with: N/A  Emergency Contact Information Contact Information   None on File    Current Medical History  Patient Admitting Diagnosis: CVA  History of Present Illness: 60 year old right-handed male with history of CAD/STEMI maintained on aspirin, tobacco abuse, obesity with BMI 36.32, diabetes mellitus, chronic systolic congestive heart failure, anemia and colon cancer.  Per chart review lives alone.  Independent prior to admission.  Presented 10/18/2020 with balance difficulties x2 days and noted fall without loss of consciousness.  He denied any weakness or double vision.  Patient initial [facility] at [facility] demonstrated multifocal embolic appearing strokes in the posterior circulation including the right cerebellum and left thalamus, periventricular white matter.  Patient did not receive TPA.  CT angiogram of head and neck negative CTA for large vessel occlusion.  50% atheromatous stenosis about the left carotid  bulb/proximal left ICA.  Bilateral lower extremity Dopplers negative.  Echocardiogram with ejection fraction of 55 to 38% grade 2 diastolic dysfunction.  Admission chemistries unremarkable except glucose 184, urinalysis negative, hemoglobin A1c 8.7, urine drug screen positive benzos, troponin negative.  Presently maintained on Brilinta for CVA prophylaxis as well as low-dose aspirin.  Patient was scheduled for TEE showed no cardiac source of embolus without thrombus negative for PFO and loop recorder was placed 10/21/2020.   Complete NIHSS TOTAL: 1 Glasgow Coma Scale Score: 15  Past Medical History  Past Medical History:  Diagnosis Date  . Anemia 10/03/2009   Qualifier: Diagnosis of  By: [name]   . Anxiety associated with depression 10/11/2013  . Colon cancer ([place])    right colon cancer- adenocarcinoma CEA level isnrmal at 1.8  . Diabetes mellitus   . Diabetes mellitus type 2 in obese [facility]) 03/20/2010   Qualifier: Diagnosis of  By: [name]    . Erectile dysfunction 06/22/2016  . Essential hypertension 06/05/2014  . FATTY LIVER DISEASE 11/04/2009   Qualifier: Diagnosis of  By: [name] MD, Docia Chuck   Qualifier: Diagnosis of  By: [name] MD, Docia Chuck  Last Assessment & Plan:  conirmed by CT scan of abdomen in April 2016 encouraged to minimize simple carbs and fatty  foods.  Marland Kitchen GERD 11/04/2009   Qualifier: Diagnosis of  By: [name] MD, Docia Chuck   . Great toe pain, right 04/18/2017  . Hepatic artery stenosis ([place])   . History of colon cancer 01/02/2010   Qualifier: Diagnosis of  By: [name] Dr [name]  Last Assessment & Plan:  Follows closely with gastroenterology, Dr [name]  . Hyperlipidemia   . Hyperlipidemia, mixed 09/07/2010   Qualifier: Diagnosis of  By: [name]   . Hypertension   . Hypertriglyceridemia 12/14/2010  . Hypotestosteronism 06/22/2016  . INSOMNIA, CHRONIC 03/20/2010   Qualifier: Diagnosis of  By: [name] Ambien 10 mg daily does not keep asleep AdvilPM is  over sedating and has trouble waking up   . Iron deficiency anemia 2011  . Low testosterone 06/22/2016  . Malignant neoplasm of colon ([place]) 01/02/2010   Qualifier: Diagnosis of  By: [name] Dr [name]   . Muscle spasm of back 06/05/2014  . Nipple pain 09/30/2016  . Obesity   . Palpitations 06/05/2014  . Preventative health care 10/08/2012  . Psoriasis   . Psoriatic arthritis ([place]) 08/25/2017  . Right knee pain 11/17/2011    Family History  family history includes Alcohol abuse in his brother; Diabetes in his brother; Diabetes type II in his brother; Stomach cancer in his mother.  Prior Rehab/Hospitalizations:  Has the patient had prior rehab or hospitalizations prior to admission? Yes  Has the patient had major surgery during 100 days prior to admission? Yes  Current Medications   Current Facility-Administered Medications:  .   stroke: mapping our early stages of recovery book, , Does not apply, Once, Hilty, Nadean Corwin, MD .  acetaminophen (TYLENOL) tablet 650 mg, 650 mg, Oral, Q4H PRN **OR** acetaminophen (TYLENOL) 160 MG/5ML solution 650 mg, 650 mg, Per Tube, Q4H PRN **OR** acetaminophen (TYLENOL) suppository 650 mg, 650 mg, Rectal, Q4H PRN, Hilty, Nadean Corwin, MD .  aspirin EC tablet 81 mg, 81 mg, Oral, Daily, [name], MD, 81 mg at 10/21/20 0934 .  diazepam (VALIUM) tablet 5 mg, 5 mg, Oral, Daily PRN, [name], MD, 5 mg at 10/19/20 1142 .  folic acid (FOLVITE) tablet 1 mg, 1 mg, Oral, Daily, Hilty, Nadean Corwin, MD, 1 mg at 10/21/20 0934 .  insulin aspart (novoLOG) injection 0-9 Units, 0-9 Units, Subcutaneous, Q4H, Hilty, Nadean Corwin, MD, 1 Units at 10/21/20 1130 .  insulin glargine (LANTUS) injection 40 Units, 40 Units, Subcutaneous, Daily, [name], MD, 40 Units at 10/21/20 [phone] .  losartan (COZAAR) tablet 12.5 mg, 12.5 mg, Oral, Daily, Hilty, Nadean Corwin, MD, 12.5 mg at 10/21/20 0933 .  pravastatin (PRAVACHOL) tablet 20 mg, 20 mg, Oral, q1800, [name],  MD, 20 mg at 10/20/20 1729 .  ticagrelor (BRILINTA) tablet 90 mg, 90 mg, Oral, BID, Hilty, Nadean Corwin, MD, 90 mg at 10/21/20 0935  Patients Current Diet:  Diet Order            Diet heart healthy/carb modified Room service appropriate? Yes; Fluid consistency: Thin  Diet effective now           Diet - low sodium heart healthy                 Precautions / Restrictions Precautions Precautions: Fall Restrictions Weight Bearing Restrictions: No   Has the patient had 2 or more falls or a fall with injury in the past year?Yes  Prior Activity Level Community (  5-7x/wk): Independent, working and driving  Prior Functional Level Prior Function Level of Independence: Independent Comments: I with ADLs/IADLs; driving; cooking/cleaning; working  Self Care: Did the patient need help bathing, dressing, using the toilet or eating?  Independent  Indoor Mobility: Did the patient need assistance with walking from room to room (with or without device)? Independent  Stairs: Did the patient need assistance with internal or external stairs (with or without device)? Independent  Functional Cognition: Did the patient need help planning regular tasks such as shopping or remembering to take medications? Independent  Home Assistive Devices / Equipment Home Equipment: Shower seat,Walker - 2 wheels,Cane - quad  Prior Device Use: Indicate devices/aids used by the patient prior to current illness, exacerbation or injury? None of the above  Current Functional Level Cognition  Overall Cognitive Status: Impaired/Different from baseline Orientation Level: Oriented X4 Safety/Judgement: Decreased awareness of safety,Decreased awareness of deficits General Comments: Very inquisitive, however not remembering answers to his many questions. Attention: Selective Selective Attention: Appears intact Memory: Impaired Memory Impairment: Storage deficit Awareness:  (unable to assess)    Extremity  Assessment (includes Sensation/Coordination)  Upper Extremity Assessment: Defer to OT evaluation (Notably decr armswing throughout gait) RUE Deficits / Details: MMT grossly 4-/5. RUE Sensation: WNL RUE Coordination: decreased fine motor  Lower Extremity Assessment: Defer to PT evaluation RLE Deficits / Details: Notably dycoordinated heel to shin; weakness noted in hip flexors, and R knee flexors RLE Coordination: decreased gross motor    ADLs  Overall ADL's : Needs assistance/impaired Grooming: Wash/dry hands,Wash/dry face,Oral care,Standing Grooming Details (indicate cue type and reason): Handwashing standing at sink level with Min guard. No AD. Upper Body Dressing : Supervision/safety,Set up,Sitting Upper Body Dressing Details (indicate cue type and reason): to don back side cover Lower Body Dressing: Supervision/safety,Set up,Sit to/from stand Lower Body Dressing Details (indicate cue type and reason): simulated from recliner with pt adjusting socks Toilet Transfer: Min guard,Ambulation,Regular Toilet,Grab bars Toilet Transfer Details (indicate cue type and reason): simulated via functional mobiltiy with and without AD with pts balance improving with use of AD Functional mobility during ADLs: Min guard General ADL Comments: Pt. is able to use R UE but does have slowed coordination .    Mobility  Overal bed mobility: Needs Assistance Bed Mobility: Supine to Sit Supine to sit: Supervision Sit to supine: Supervision,HOB elevated General bed mobility comments: Increased time to transition to EOB. HOB flat and no use of rails to simulate home environment. Attempting to utilize momentum to get trunk elevated.    Transfers  Overall transfer level: Needs assistance Equipment used: None,Rolling walker (2 wheeled) Transfers: Sit to/from Stand Sit to Stand: Min guard Stand pivot transfers: Min guard General transfer comment: Attempting to stand without UE use to "show" therapist he could  do it. Very unsteady and without a smooth transition to standing. Light min assist required.    Ambulation / Gait / Stairs / Wheelchair Mobility  Ambulation/Gait Ambulation/Gait assistance: Min assist,Min guard Gait Distance (Feet): 200 Feet Assistive device: None,Rolling walker (2 wheeled) Gait Pattern/deviations: Wide base of support,Decreased step length - right,Decreased step length - left,Decreased stride length General Gait Details: Wide BOS with ambulation without an AD. With RW, pt able to improve overall gait pattern but with occasional unsteadiness and need for assist, more so with higher level balance activity. Gait velocity: Decreased Gait velocity interpretation: <1.8 ft/sec, indicate of risk for recurrent falls Stairs: Yes Stairs assistance: Min guard,Mod assist Stair Management: Two rails,No rails,Forwards Number of Stairs: 5  General stair comments: Attempted stairs without rails, again to "show" therapist he was able to do it, and required mod assist for balance support and safety. With 2 rails, min guard assist and increased time required.    Posture / Balance Dynamic Sitting Balance Sitting balance - Comments: sitting EOB for LB ADLs with no LOB Balance Overall balance assessment: Needs assistance Sitting-balance support: No upper extremity supported,Feet supported Sitting balance-Leahy Scale: Good Sitting balance - Comments: sitting EOB for LB ADLs with no LOB Standing balance support: No upper extremity supported,During functional activity Standing balance-Leahy Scale: Fair Standing balance comment: Dynamically Standardized Balance Assessment Standardized Balance Assessment : Dynamic Gait Index Dynamic Gait Index Level Surface: Mild Impairment Change in Gait Speed: Mild Impairment Gait with Horizontal Head Turns: Moderate Impairment Gait with Vertical Head Turns: Mild Impairment Gait and Pivot Turn: Moderate Impairment Step Over Obstacle: Severe Impairment Step  Around Obstacles: Mild Impairment Steps: Moderate Impairment Total Score: 11    Special needs/care consideration Loop placed 4/5   Previous Home Environment  Living Arrangements: Alone  Lives With: Alone Available Help at Discharge: Friend(s),Available PRN/intermittently Landscape architect, close friends; can be checked on daily) Type of Home: House Home Layout: One level Home Access: Stairs to enter Technical brewer of Steps: 1 Bathroom Shower/Tub: Multimedia programmer: Standard Bathroom Accessibility: Yes How Accessible: Accessible via walker [place]: No  Discharge Living Setting Plans for Discharge Living Setting: Patient's home,Alone Type of Home at Discharge: House Discharge Home Layout: One level Discharge Home Access: Stairs to enter Entrance Stairs-Rails: None Entrance Stairs-Number of Steps: 1 Discharge Bathroom Shower/Tub: Walk-in shower Discharge Bathroom Toilet: Standard Discharge Bathroom Accessibility: Yes How Accessible: Accessible via walker Does the patient have any problems obtaining your medications?: No  Social/Family/Support Systems Contact Information: [name], friend Anticipated Caregiver: [name] Anticipated Caregiver's Contact Information: see above Ability/Limitations of Caregiver: [name] works days Caregiver Availability: Intermittent Discharge Plan Discussed with Primary Caregiver: Yes Is Caregiver In Agreement with Plan?: Yes Does Caregiver/Family have Issues with Lodging/Transportation while Pt is in Rehab?: No  Goals Patient/Family Goal for Rehab: Mod I with PT and OT Expected length of stay: ELOS 7 days Pt/Family Agrees to Admission and willing to participate: Yes Program Orientation Provided & Reviewed with Pt/Caregiver Including Roles  & Responsibilities: Yes  Decrease burden of Care through IP rehab admission: n/a  Possible need for SNF placement upon discharge:not anticipated  Patient Condition: This patient's  condition remains as documented in the consult dated 10/20/2020 , in which the Rehabilitation Physician determined and documented that the patient's condition is appropriate for intensive rehabilitative care in an inpatient rehabilitation facility. Will admit to inpatient rehab today.  Preadmission Screen Completed By:  Cleatrice Burke, RN, 10/21/2020 4:25 PM ______________________________________________________________________   Discussed status with Dr. Naaman Plummer on 10/21/2020 at  1632 and received approval for admission today.  Admission Coordinator:  Cleatrice Burke, time 2482 Date 10/21/2020

## 2020-10-21 NOTE — Anesthesia Procedure Notes (Signed)
Procedure Name: MAC Date/Time: 10/21/2020 8:15 AM Performed by: Candis Shine, CRNA Pre-anesthesia Checklist: Patient identified, Emergency Drugs available, Suction available, Patient being monitored and Timeout performed Patient Re-evaluated:Patient Re-evaluated prior to induction Oxygen Delivery Method: Nasal cannula Dental Injury: Teeth and Oropharynx as per pre-operative assessment

## 2020-10-21 NOTE — Interval H&P Note (Signed)
History and Physical Interval Note:  10/21/2020 12:21 PM  [name]  has presented today for surgery, with the diagnosis of stroke.  The various methods of treatment have been discussed with the patient and family. After consideration of risks, benefits and other options for treatment, the patient has consented to  Procedure(s): LOOP RECORDER INSERTION (N/A) as a surgical intervention.  The patient's history has been reviewed, patient examined, no change in status, stable for surgery.  I have reviewed the patient's chart and labs.  Questions were answered to the patient's satisfaction.     [name]

## 2020-10-21 NOTE — Discharge Summary (Signed)
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 prior study on file Performing Technologist: Sharion Dove RVS  Examination Guidelines: A complete evaluation includes B-mode imaging, spectral Doppler, color Doppler, and power Doppler as needed of all accessible portions of each vessel. Bilateral testing is considered an integral part of a complete examination. Limited examinations for reoccurring indications may be performed as noted. The reflux portion of the exam is performed with the patient in reverse Trendelenburg.  +---------+---------------+---------+-----------+----------+--------------+ RIGHT    CompressibilityPhasicitySpontaneityPropertiesThrombus Aging +---------+---------------+---------+-----------+----------+--------------+ CFV      Full           Yes      Yes                                 +---------+---------------+---------+-----------+----------+--------------+ SFJ      Full                                                        +---------+---------------+---------+-----------+----------+--------------+ FV Prox  Full                                                         +---------+---------------+---------+-----------+----------+--------------+  FV Mid   Full                                                        +---------+---------------+---------+-----------+----------+--------------+ FV DistalFull                                                        +---------+---------------+---------+-----------+----------+--------------+ PFV      Full                                                        +---------+---------------+---------+-----------+----------+--------------+ POP      Full           Yes      Yes                                 +---------+---------------+---------+-----------+----------+--------------+ PTV      Full                                                        +---------+---------------+---------+-----------+----------+--------------+ PERO     Full                                                        +---------+---------------+---------+-----------+----------+--------------+   +---------+---------------+---------+-----------+----------+--------------+ LEFT     CompressibilityPhasicitySpontaneityPropertiesThrombus Aging +---------+---------------+---------+-----------+----------+--------------+ CFV      Full           Yes      Yes                                 +---------+---------------+---------+-----------+----------+--------------+ SFJ      Full                                                        +---------+---------------+---------+-----------+----------+--------------+ FV Prox  Full                                                        +---------+---------------+---------+-----------+----------+--------------+ FV Mid   Full                                                        +---------+---------------+---------+-----------+----------+--------------+  FV DistalFull                                                         +---------+---------------+---------+-----------+----------+--------------+ PFV      Full                                                        +---------+---------------+---------+-----------+----------+--------------+ POP      Full           Yes      Yes                                 +---------+---------------+---------+-----------+----------+--------------+ PTV      Full                                                        +---------+---------------+---------+-----------+----------+--------------+ PERO     Full                                                        +---------+---------------+---------+-----------+----------+--------------+     Summary: BILATERAL: - No evidence of deep vein thrombosis seen in the lower extremities, bilaterally. -   *See table(s) above for measurements and observations. Electronically signed by Monica Martinez MD on 10/19/2020 at 4:23:09 PM.    Final     Microbiology: Recent Results (from the past 240 hour(s))  SARS CORONAVIRUS 2 (TAT 6-24 HRS) Nasopharyngeal Nasopharyngeal Swab     Status: None   Collection Time: 10/18/20 10:10 PM   Specimen: Nasopharyngeal Swab  Result Value Ref Range Status   SARS Coronavirus 2 NEGATIVE NEGATIVE Final    Comment: (NOTE) SARS-CoV-2 target nucleic acids are NOT DETECTED.  The SARS-CoV-2 RNA is generally detectable in upper and lower respiratory specimens during the acute phase of infection. Negative results do not preclude SARS-CoV-2 infection, do not rule out co-infections with other pathogens, and should not be used as the sole basis for treatment or other patient management decisions. Negative results must be combined with clinical observations, patient history, and epidemiological information. The expected result is Negative.  Fact Sheet for Patients: SugarRoll.be  Fact Sheet for Healthcare Providers: https://www.woods-mathews.com/  This  test is not yet approved or cleared by the [place] FDA and  has been authorized for detection and/or diagnosis of SARS-CoV-2 by FDA under an Emergency Use Authorization (EUA). This EUA will remain  in effect (meaning this test can be used) for the duration of the COVID-19 declaration under Se ction 564(b)(1) of the Act, 21 U.S.C. section 360bbb-3(b)(1), unless the authorization is terminated or revoked sooner.  Performed at [facility], [place] [address], [place], [place] [address]   Resp Panel by RT-PCR (Flu A&B, Covid) Nasopharyngeal Swab     Status: None  Collection Time: 10/19/20  1:35 PM   Specimen: Nasopharyngeal Swab; Nasopharyngeal(NP) swabs in vial transport medium  Result Value Ref Range Status   SARS Coronavirus 2 by RT PCR NEGATIVE NEGATIVE Final    Comment: (NOTE) SARS-CoV-2 target nucleic acids are NOT DETECTED.  The SARS-CoV-2 RNA is generally detectable in upper respiratory specimens during the acute phase of infection. The lowest concentration of SARS-CoV-2 viral copies this assay can detect is 138 copies/mL. A negative result does not preclude SARS-Cov-2 infection and should not be used as the sole basis for treatment or other patient management decisions. A negative result may occur with  improper specimen collection/handling, submission of specimen other than nasopharyngeal swab, presence of viral mutation(s) within the areas targeted by this assay, and inadequate number of viral copies(<138 copies/mL). A negative result must be combined with clinical observations, patient history, and epidemiological information. The expected result is Negative.  Fact Sheet for Patients:  EntrepreneurPulse.com.au  Fact Sheet for Healthcare Providers:  IncredibleEmployment.be  This test is no t yet approved or cleared by the [place] FDA and  has been authorized for detection and/or diagnosis of SARS-CoV-2 by FDA under an  Emergency Use Authorization (EUA). This EUA will remain  in effect (meaning this test can be used) for the duration of the COVID-19 declaration under Section 564(b)(1) of the Act, 21 U.S.C.section 360bbb-3(b)(1), unless the authorization is terminated  or revoked sooner.       Influenza A by PCR NEGATIVE NEGATIVE Final   Influenza B by PCR NEGATIVE NEGATIVE Final    Comment: (NOTE) The Xpert Xpress SARS-CoV-2/FLU/RSV plus assay is intended as an aid in the diagnosis of influenza from Nasopharyngeal swab specimens and should not be used as a sole basis for treatment. Nasal washings and aspirates are unacceptable for Xpert Xpress SARS-CoV-2/FLU/RSV testing.  Fact Sheet for Patients: EntrepreneurPulse.com.au  Fact Sheet for Healthcare Providers: IncredibleEmployment.be  This test is not yet approved or cleared by the [place] FDA and has been authorized for detection and/or diagnosis of SARS-CoV-2 by FDA under an Emergency Use Authorization (EUA). This EUA will remain in effect (meaning this test can be used) for the duration of the COVID-19 declaration under Section 564(b)(1) of the Act, 21 U.S.C. section 360bbb-3(b)(1), unless the authorization is terminated or revoked.  Performed at [facility], [place] [address] [place], [place] [address]      Labs: Basic Metabolic Panel: Recent Labs  Lab 10/18/20 1603  NA 135  K 3.5  CL 104  CO2 21*  GLUCOSE 184*  BUN 12  CREATININE 0.82  CALCIUM 8.6*   Liver Function Tests: Recent Labs  Lab 10/18/20 1603  AST 32  ALT 40  ALKPHOS 38  BILITOT 2.0*  PROT 7.1  ALBUMIN 3.8   No results for input(s): LIPASE, AMYLASE in the last 168 hours. No results for input(s): AMMONIA in the last 168 hours. CBC: Recent Labs  Lab 10/18/20 1603  WBC 9.4  NEUTROABS 6.0  HGB 13.5  HCT 38.7*  MCV 95.1  PLT 283   Cardiac Enzymes: No results for input(s): CKTOTAL, CKMB, CKMBINDEX,  TROPONINI in the last 168 hours. BNP: BNP (last 3 results) No results for input(s): BNP in the last 8760 hours.  ProBNP (last 3 results) No results for input(s): PROBNP in the last 8760 hours.  CBG: Recent Labs  Lab 10/20/20 1217 10/20/20 1611 10/20/20 2044 10/21/20 0010 10/21/20 0420  GLUCAP 254* 173* 206* 142* 141*  Signed:  Nita Sells MD   [facility] 10/21/2020, 11:28 AM

## 2020-10-21 NOTE — Progress Notes (Signed)
PT Cancellation Note  Patient Details Name: [name] MRN: [id number] DOB: [birth year]   Cancelled Treatment:    Reason Eval/Treat Not Completed: Patient at procedure or test/unavailable   Second attempt for PT session; pt getting loop recorder;   Will follow up later today as time allows;  Otherwise, will follow up for PT tomorrow;   Thank you,  Roney Marion, PT  Acute Rehabilitation Services Pager [phone] Office [phone]     [place]ta [place] 10/21/2020, 12:50 PM

## 2020-10-21 NOTE — Progress Notes (Signed)
Frank Gong, RN  Rehab Admission Coordinator  Physical Medicine and Rehabilitation  PMR Pre-admission     Signed  Date of Service:  10/21/2020  4:25 PM      Related encounter: ED to Hosp-Admission (Discharged) from 10/18/2020 in [facility]       Signed          Show:Clear all [x] Manual[x] Template[x] Copied  Added by: [x] Frank Gong, RN   [] Hover for details  PMR Admission Coordinator Pre-Admission Assessment   Patient: [name] is an 60 y.o., male MRN: [id number] DOB: [birth year] Height: 5\' 8"  (172.7 cm) Weight: 113.4 kg                                                                                                                                                  Insurance Information HMO:     PPO:      PCP:      IPA:      80/20:      OTHER:  PRIMARY: UHC/UMR      Policy#: [id number]      Subscriber: pt CM Name:       Phone#: [phone]     Fax#: [phone] Pre-Cert#: [id number]      Employer:  Benefits:  Phone #: [phone]     Name: 4/4 Eff. Date: 03/19/2020     Deduct: $2800      Out of Pocket Max: $5600  CIR: 80%      SNF: 80% Outpatient: 80%     Co-Pay: 20% per medical neccesity Home Health: 80%      Co-Pay: per medical neccesity for visits DME: 80%     Co-Pay: 20% Providers: in network  SECONDARY: none   Financial Counselor:       Phone#:    [employer] Engineer, [employer]" for patients in Inpatient Rehabilitation Facilities with attached "Privacy Act [place] Records" was provided and verbally reviewed with: N/A   Emergency Contact Information Contact Information   None on File      Current Medical History  Patient Admitting Diagnosis: CVA   History of Present Illness: 60 year old right-handed male with history of CAD/STEMI maintained on aspirin, tobacco abuse, obesity with BMI 36.32, diabetes mellitus, chronic systolic congestive heart failure, anemia and colon cancer.  Per chart review  lives alone.  Independent prior to admission.  Presented 10/18/2020 with balance difficulties x2 days and noted fall without loss of consciousness.  He denied any weakness or double vision.  Patient initial MRI at [facility] demonstrated multifocal embolic appearing strokes in the posterior circulation including the right cerebellum and left thalamus, periventricular white matter.  Patient did not receive TPA.  CT angiogram of head and neck negative CTA for large vessel occlusion.  50% atheromatous stenosis about the left carotid bulb/proximal left ICA.  Bilateral lower extremity  Dopplers negative.  Echocardiogram with ejection fraction of 55 to 42% grade 2 diastolic dysfunction.  Admission chemistries unremarkable except glucose 184, urinalysis negative, hemoglobin A1c 8.7, urine drug screen positive benzos, troponin negative.  Presently maintained on Brilinta for CVA prophylaxis as well as low-dose aspirin.  Patient was scheduled for TEE showed no cardiac source of embolus without thrombus negative for PFO and loop recorder was placed 10/21/2020.    Complete NIHSS TOTAL: 1 Glasgow Coma Scale Score: 15   Past Medical History      Past Medical History:  Diagnosis Date  . Anemia 10/03/2009    Qualifier: Diagnosis of  By: [name]   . Anxiety associated with depression 10/11/2013  . Colon cancer ([place])      right colon cancer- adenocarcinoma CEA level isnrmal at 1.8  . Diabetes mellitus    . Diabetes mellitus type 2 in obese [facility]) 03/20/2010    Qualifier: Diagnosis of  By: [name]    . Erectile dysfunction 06/22/2016  . Essential hypertension 06/05/2014  . FATTY LIVER DISEASE 11/04/2009    Qualifier: Diagnosis of  By: [name] MD, Docia Chuck   Qualifier: Diagnosis of  By: [name] MD, Docia Chuck  Last Assessment & Plan:  conirmed by CT scan of abdomen in April 2016 encouraged to minimize simple carbs and fatty foods.  Marland Kitchen GERD 11/04/2009    Qualifier: Diagnosis of  By: [name] MD, Docia Chuck   .  Great toe pain, right 04/18/2017  . Hepatic artery stenosis ([place])    . History of colon cancer 01/02/2010    Qualifier: Diagnosis of  By: [name] Dr [name]  Last Assessment & Plan:  Follows closely with gastroenterology, Dr [name]  . Hyperlipidemia    . Hyperlipidemia, mixed 09/07/2010    Qualifier: Diagnosis of  By: [name]   . Hypertension    . Hypertriglyceridemia 12/14/2010  . Hypotestosteronism 06/22/2016  . INSOMNIA, CHRONIC 03/20/2010    Qualifier: Diagnosis of  By: [name] Ambien 10 mg daily does not keep asleep AdvilPM is over sedating and has trouble waking up   . Iron deficiency anemia 2011  . Low testosterone 06/22/2016  . Malignant neoplasm of colon ([place]) 01/02/2010    Qualifier: Diagnosis of  By: [name] Dr [name]   . Muscle spasm of back 06/05/2014  . Nipple pain 09/30/2016  . Obesity    . Palpitations 06/05/2014  . Preventative health care 10/08/2012  . Psoriasis    . Psoriatic arthritis ([place]) 08/25/2017  . Right knee pain 11/17/2011      Family History  family history includes Alcohol abuse in his brother; Diabetes in his brother; Diabetes type II in his brother; Stomach cancer in his mother.   Prior Rehab/Hospitalizations:  Has the patient had prior rehab or hospitalizations prior to admission? Yes   Has the patient had major surgery during 100 days prior to admission? Yes   Current Medications    Current Facility-Administered Medications:  .   stroke: mapping our early stages of recovery book, , Does not apply, Once, Hilty, Nadean Corwin, MD .  acetaminophen (TYLENOL) tablet 650 mg, 650 mg, Oral, Q4H PRN **OR** acetaminophen (TYLENOL) 160 MG/5ML solution 650 mg, 650 mg, Per Tube, Q4H PRN **OR** acetaminophen (TYLENOL) suppository 650 mg, 650 mg, Rectal, Q4H PRN, Hilty, Nadean Corwin, MD .  aspirin EC tablet 81 mg, 81 mg, Oral, Daily, Hilty, Nadean Corwin, MD, 81 mg  at 10/21/20 0934 .  diazepam (VALIUM) tablet 5 mg, 5 mg, Oral, Daily PRN, [name], MD, 5 mg at 10/19/20 1142 .  folic acid (FOLVITE) tablet 1 mg, 1 mg, Oral, Daily, Hilty, Nadean Corwin, MD, 1 mg at 10/21/20 0934 .  insulin aspart (novoLOG) injection 0-9 Units, 0-9 Units, Subcutaneous, Q4H, Hilty, Nadean Corwin, MD, 1 Units at 10/21/20 1130 .  insulin glargine (LANTUS) injection 40 Units, 40 Units, Subcutaneous, Daily, [name], MD, 40 Units at 10/21/20 [phone] .  losartan (COZAAR) tablet 12.5 mg, 12.5 mg, Oral, Daily, Hilty, Nadean Corwin, MD, 12.5 mg at 10/21/20 0933 .  pravastatin (PRAVACHOL) tablet 20 mg, 20 mg, Oral, q1800, [name], MD, 20 mg at 10/20/20 1729 .  ticagrelor (BRILINTA) tablet 90 mg, 90 mg, Oral, BID, Hilty, Nadean Corwin, MD, 90 mg at 10/21/20 0935   Patients Current Diet:     Diet Order                      Diet heart healthy/carb modified Room service appropriate? Yes; Fluid consistency: Thin  Diet effective now              Diet - low sodium heart healthy                      Precautions / Restrictions Precautions Precautions: Fall Restrictions Weight Bearing Restrictions: No    Has the patient had 2 or more falls or a fall with injury in the past year?Yes   Prior Activity Level Community (5-7x/wk): Independent, working and driving   Prior Functional Level Prior Function Level of Independence: Independent Comments: I with ADLs/IADLs; driving; cooking/cleaning; working   Self Care: Did the patient need help bathing, dressing, using the toilet or eating?  Independent   Indoor Mobility: Did the patient need assistance with walking from room to room (with or without device)? Independent   Stairs: Did the patient need assistance with internal or external stairs (with or without device)? Independent   Functional Cognition: Did the patient need help planning regular tasks such as shopping or remembering to take medications? Independent   Home Assistive Devices / Equipment Home Equipment: Shower seat,Walker - 2 wheels,Cane -  quad   Prior Device Use: Indicate devices/aids used by the patient prior to current illness, exacerbation or injury? None of the above   Current Functional Level Cognition   Overall Cognitive Status: Impaired/Different from baseline Orientation Level: Oriented X4 Safety/Judgement: Decreased awareness of safety,Decreased awareness of deficits General Comments: Very inquisitive, however not remembering answers to his many questions. Attention: Selective Selective Attention: Appears intact Memory: Impaired Memory Impairment: Storage deficit Awareness:  (unable to assess)    Extremity Assessment (includes Sensation/Coordination)   Upper Extremity Assessment: Defer to OT evaluation (Notably decr armswing throughout gait) RUE Deficits / Details: MMT grossly 4-/5. RUE Sensation: WNL RUE Coordination: decreased fine motor  Lower Extremity Assessment: Defer to PT evaluation RLE Deficits / Details: Notably dycoordinated heel to shin; weakness noted in hip flexors, and R knee flexors RLE Coordination: decreased gross motor     ADLs   Overall ADL's : Needs assistance/impaired Grooming: Wash/dry hands,Wash/dry face,Oral care,Standing Grooming Details (indicate cue type and reason): Handwashing standing at sink level with Min guard. No AD. Upper Body Dressing : Supervision/safety,Set up,Sitting Upper Body Dressing Details (indicate cue type and reason): to don back side cover Lower Body Dressing: Supervision/safety,Set up,Sit to/from stand Lower Body Dressing Details (indicate cue  type and reason): simulated from recliner with pt adjusting socks Toilet Transfer: Min guard,Ambulation,Regular Toilet,Grab bars Toilet Transfer Details (indicate cue type and reason): simulated via functional mobiltiy with and without AD with pts balance improving with use of AD Functional mobility during ADLs: Min guard General ADL Comments: Pt. is able to use R UE but does have slowed coordination .      Mobility   Overal bed mobility: Needs Assistance Bed Mobility: Supine to Sit Supine to sit: Supervision Sit to supine: Supervision,HOB elevated General bed mobility comments: Increased time to transition to EOB. HOB flat and no use of rails to simulate home environment. Attempting to utilize momentum to get trunk elevated.     Transfers   Overall transfer level: Needs assistance Equipment used: None,Rolling walker (2 wheeled) Transfers: Sit to/from Stand Sit to Stand: Min guard Stand pivot transfers: Min guard General transfer comment: Attempting to stand without UE use to "show" therapist he could do it. Very unsteady and without a smooth transition to standing. Light min assist required.     Ambulation / Gait / Stairs / Wheelchair Mobility   Ambulation/Gait Ambulation/Gait assistance: Min assist,Min guard Gait Distance (Feet): 200 Feet Assistive device: None,Rolling walker (2 wheeled) Gait Pattern/deviations: Wide base of support,Decreased step length - right,Decreased step length - left,Decreased stride length General Gait Details: Wide BOS with ambulation without an AD. With RW, pt able to improve overall gait pattern but with occasional unsteadiness and need for assist, more so with higher level balance activity. Gait velocity: Decreased Gait velocity interpretation: <1.8 ft/sec, indicate of risk for recurrent falls Stairs: Yes Stairs assistance: Min guard,Mod assist Stair Management: Two rails,No rails,Forwards Number of Stairs: 5 General stair comments: Attempted stairs without rails, again to "show" therapist he was able to do it, and required mod assist for balance support and safety. With 2 rails, min guard assist and increased time required.     Posture / Balance Dynamic Sitting Balance Sitting balance - Comments: sitting EOB for LB ADLs with no LOB Balance Overall balance assessment: Needs assistance Sitting-balance support: No upper extremity supported,Feet  supported Sitting balance-Leahy Scale: Good Sitting balance - Comments: sitting EOB for LB ADLs with no LOB Standing balance support: No upper extremity supported,During functional activity Standing balance-Leahy Scale: Fair Standing balance comment: Dynamically Standardized Balance Assessment Standardized Balance Assessment : Dynamic Gait Index Dynamic Gait Index Level Surface: Mild Impairment Change in Gait Speed: Mild Impairment Gait with Horizontal Head Turns: Moderate Impairment Gait with Vertical Head Turns: Mild Impairment Gait and Pivot Turn: Moderate Impairment Step Over Obstacle: Severe Impairment Step Around Obstacles: Mild Impairment Steps: Moderate Impairment Total Score: 11     Special needs/care consideration Loop placed 4/5    Previous Home Environment  Living Arrangements: Alone  Lives With: Alone Available Help at Discharge: Friend(s),Available PRN/intermittently Landscape architect, close friends; can be checked on daily) Type of Home: House Home Layout: One level Home Access: Stairs to enter Technical brewer of Steps: 1 Bathroom Shower/Tub: Multimedia programmer: Standard Bathroom Accessibility: Yes How Accessible: Accessible via walker Ferguson: No   Discharge Living Setting Plans for Discharge Living Setting: Patient's home,Alone Type of Home at Discharge: House Discharge Home Layout: One level Discharge Home Access: Stairs to enter Entrance Stairs-Rails: None Entrance Stairs-Number of Steps: 1 Discharge Bathroom Shower/Tub: Walk-in shower Discharge Bathroom Toilet: Standard Discharge Bathroom Accessibility: Yes How Accessible: Accessible via walker Does the patient have any problems obtaining your medications?: No   Social/Family/Support Systems Contact Information:  [name], friend Anticipated Caregiver: [name] Anticipated Caregiver's Contact Information: see above Ability/Limitations of Caregiver: [name] works days Caregiver  Availability: Intermittent Discharge Plan Discussed with Primary Caregiver: Yes Is Caregiver In Agreement with Plan?: Yes Does Caregiver/Family have Issues with Lodging/Transportation while Pt is in Rehab?: No   Goals Patient/Family Goal for Rehab: Mod I with PT and OT Expected length of stay: ELOS 7 days Pt/Family Agrees to Admission and willing to participate: Yes Program Orientation Provided & Reviewed with Pt/Caregiver Including Roles  & Responsibilities: Yes   Decrease burden of Care through IP rehab admission: n/a   Possible need for SNF placement upon discharge:not anticipated   Patient Condition: This patient's condition remains as documented in the consult dated 10/20/2020 , in which the Rehabilitation Physician determined and documented that the patient's condition is appropriate for intensive rehabilitative care in an inpatient rehabilitation facility. Will admit to inpatient rehab today.   Preadmission Screen Completed By:  Cleatrice Burke, RN, 10/21/2020 4:25 PM ______________________________________________________________________   Discussed status with Dr. Naaman Plummer on 10/21/2020 at  1632 and received approval for admission today.   Admission Coordinator:  Cleatrice Burke, time 0174 Date 10/21/2020             Cosigned by: Meredith Staggers, MD at 10/21/2020  4:41 PM    Revision History                    Note Details  Author Frank Gong, RN File Time 10/21/2020  4:33 PM  Author Type Rehab Admission Coordinator Status Signed  Last Editor Frank Gong, RN Service Physical Medicine and [place] # [id number] Admit Date 10/21/2020

## 2020-10-21 NOTE — Progress Notes (Signed)
  Echocardiogram Echocardiogram Transesophageal has been performed.  [name] 10/21/2020, 8:45 AM

## 2020-10-21 NOTE — H&P (Signed)
Physical Medicine and Rehabilitation Admission H&P    Chief Complaint  Patient presents with  . Fall  . Altered Mental Status  : HPI: [name] is a 60 year old right-handed male with history of CAD/STEMI maintained on aspirin, tobacco abuse, obesity with BMI 36.32, diabetes mellitus, chronic systolic congestive heart failure, anemia and colon cancer.  Per chart review lives alone.  Independent prior to admission.  Presented 10/18/2020 with balance difficulties x2 days and noted fall without loss of consciousness.  He denied any weakness or double vision.  Patient initial MRI at [facility] demonstrated multifocal embolic appearing strokes in the posterior circulation including the right cerebellum and left thalamus, periventricular white matter.  Patient did not receive TPA.  CT angiogram of head and neck negative CTA for large vessel occlusion.  50% atheromatous stenosis about the left carotid bulb/proximal left ICA.  Bilateral lower extremity Dopplers negative.  Echocardiogram with ejection fraction of 55 to 69% grade 2 diastolic dysfunction.  Admission chemistries unremarkable except glucose 184, urinalysis negative, hemoglobin A1c 8.7, urine drug screen positive benzos, troponin negative.  Presently maintained on Brilinta for CVA prophylaxis as well as low-dose aspirin.  Patient was scheduled for TEE showed no cardiac source of embolus without thrombus negative for PFO and loop recorder was placed 10/21/2020.  Therapy evaluations completed due to patient decreased functional mobility was admitted for a comprehensive rehab program.  Review of Systems  Constitutional: Negative for chills and fever.  HENT: Negative for hearing loss.   Eyes: Negative for blurred vision and double vision.  Respiratory: Negative for cough and shortness of breath.   Cardiovascular: Positive for palpitations.  Gastrointestinal: Positive for constipation. Negative for heartburn, nausea and vomiting.   Genitourinary: Negative for dysuria, flank pain and hematuria.  Musculoskeletal: Positive for joint pain and myalgias.  Skin: Negative for rash.  Neurological: Positive for dizziness and weakness.  Psychiatric/Behavioral: Positive for depression. The patient has insomnia.        Anxiety  All other systems reviewed and are negative.  Past Medical History:  Diagnosis Date  . Anemia 10/03/2009   Qualifier: Diagnosis of  By: [name]   . Anxiety associated with depression 10/11/2013  . Colon cancer ([place])    right colon cancer- adenocarcinoma CEA level isnrmal at 1.8  . Diabetes mellitus   . Diabetes mellitus type 2 in obese [facility]) 03/20/2010   Qualifier: Diagnosis of  By: [name]    . Erectile dysfunction 06/22/2016  . Essential hypertension 06/05/2014  . FATTY LIVER DISEASE 11/04/2009   Qualifier: Diagnosis of  By: [name] MD, Docia Chuck   Qualifier: Diagnosis of  By: [name] MD, Docia Chuck  Last Assessment & Plan:  conirmed by CT scan of abdomen in April 2016 encouraged to minimize simple carbs and fatty foods.  Marland Kitchen GERD 11/04/2009   Qualifier: Diagnosis of  By: [name] MD, Docia Chuck   . Great toe pain, right 04/18/2017  . Hepatic artery stenosis ([place])   . History of colon cancer 01/02/2010   Qualifier: Diagnosis of  By: [name] Dr [name]  Last Assessment & Plan:  Follows closely with gastroenterology, Dr [name]  . Hyperlipidemia   . Hyperlipidemia, mixed 09/07/2010   Qualifier: Diagnosis of  By: [name]   . Hypertension   . Hypertriglyceridemia 12/14/2010  . Hypotestosteronism 06/22/2016  . INSOMNIA, CHRONIC 03/20/2010   Qualifier: Diagnosis of  By: [name] Ambien 10 mg  daily does not keep asleep AdvilPM is over sedating and has trouble waking up   . Iron deficiency anemia 2011  . Low testosterone 06/22/2016  . Malignant neoplasm of colon ([place]) 01/02/2010   Qualifier: Diagnosis of  By: [name] Dr [name]   . Muscle spasm of back 06/05/2014  . Nipple pain  09/30/2016  . Obesity   . Palpitations 06/05/2014  . Preventative health care 10/08/2012  . Psoriasis   . Psoriatic arthritis ([place]) 08/25/2017  . Right knee pain 11/17/2011   Past Surgical History:  Procedure Laterality Date  . CARPAL TUNNEL RELEASE     RIGHT  . CHOLECYSTECTOMY  1994  . COLONOSCOPY    . CORONARY/GRAFT ACUTE MI REVASCULARIZATION N/A 10/27/2019   Procedure: Coronary/Graft Acute MI Revascularization;  Surgeon: [name], MD;  Location: [place] [facility];  Service: Cardiovascular;  Laterality: N/A;  . HEMICOLECTOMY     12/17/2009 right  . LEFT HEART CATH AND CORONARY ANGIOGRAPHY N/A 10/27/2019   Procedure: LEFT HEART CATH AND CORONARY ANGIOGRAPHY;  Surgeon: [name], MD;  Location: [place] [facility];  Service: Cardiovascular;  Laterality: N/A;  . POLYPECTOMY     Family History  Problem Relation Age of Onset  . Stomach cancer Mother        diedinher 61's  . Diabetes type II Brother        boderline  . Diabetes Brother        type II  . Alcohol abuse Brother   . Other Neg Hx        cad,prostate ca, colon ca  . Coronary artery disease Neg Hx   . Cancer Neg Hx        colon, prostate  . Colon cancer Neg Hx   . Esophageal cancer Neg Hx   . Rectal cancer Neg Hx    Social History:  reports that he quit smoking about a year ago. His smoking use included cigarettes. He has never used smokeless tobacco. He reports that he does not drink alcohol and does not use drugs. Allergies:  Allergies  Allergen Reactions  . Statins     Causes muscle weakness    Medications Prior to Admission  Medication Sig Dispense Refill  . aspirin EC 81 MG tablet Take 1 tablet (81 mg total) by mouth daily. 90 tablet 3  . clobetasol cream (TEMOVATE) 7.56 % Apply 1 application topically at bedtime as needed (psoriasis).    . diazepam (VALIUM) 5 MG tablet Take 1 tablet (5 mg total) by mouth daily as needed for anxiety. 30 tablet 2  . diclofenac (VOLTAREN) 75 MG EC tablet TAKE 1  TABLET (75 MG TOTAL) BY MOUTH 2 (TWO) TIMES DAILY AS NEEDED. (Patient taking differently: Take 75 mg by mouth 2 (two) times daily as needed for moderate pain.) 60 tablet 1  . folic acid (FOLVITE) 1 MG tablet Take 1 mg by mouth daily.     . insulin glargine (LANTUS SOLOSTAR) 100 UNIT/ML Solostar Pen Inject 60 Units into the skin daily. 15 mL 3  . insulin lispro (HUMALOG KWIKPEN) 100 UNIT/ML KwikPen Inject 15 Units into the skin 3 (three) times daily. 15 mL 3  . Ixekizumab 80 MG/ML SOSY Inject 80 mg into the skin every 28 (twenty-eight) days.    Marland Kitchen losartan (COZAAR) 25 MG tablet Take 1 tablet (25 mg total) by mouth daily. 90 tablet 3  . methotrexate (RHEUMATREX) 2.5 MG tablet Take 2.5 mg by mouth as needed (psoriasis).    Marland Kitchen  predniSONE (DELTASONE) 5 MG tablet Take 1 tablet by mouth daily as needed (psoriasis).    . Blood Glucose Monitoring Suppl (CONTOUR NEXT MONITOR) w/Device KIT Use to check blood sugar tid.  Dx code: E11.9 1 kit 0  . BRILINTA 90 MG TABS tablet TAKE 1 TABLET(90 MG) BY MOUTH TWICE DAILY (Patient not taking: Reported on 10/19/2020) 180 tablet 3  . glucose blood (CONTOUR NEXT TEST) test strip Use to check blood sugar tid.  Dx code: E11.9 300 each 1  . HYDROcodone-acetaminophen (NORCO) 5-325 MG tablet Take 1 tablet by mouth every 6 (six) hours as needed for moderate pain or severe pain. (Patient not taking: No sig reported) 30 tablet 0  . Insulin Pen Needle (B-D ULTRAFINE III SHORT PEN) 31G X 8 MM MISC Test as directed three times daily.  DX E11.9 300 each 3  . metoprolol succinate (TOPROL XL) 25 MG 24 hr tablet Take 1 tablet (25 mg total) by mouth daily. 90 tablet 3  . Microlet Lancets MISC Use to check blood sugar tid.  Dx code: E11.9 300 each 1  . rosuvastatin (CRESTOR) 10 MG tablet Take 1 tablet (10 mg total) by mouth daily. (Patient not taking: No sig reported) 90 tablet 3  . sulfamethoxazole-trimethoprim (BACTRIM DS) 800-160 MG tablet Take 1 tablet by mouth 2 (two) times daily.  (Patient not taking: No sig reported) 20 tablet 0  . zolpidem (AMBIEN) 10 MG tablet TAKE 1 TABLET(10 MG) BY MOUTH AT BEDTIME AS NEEDED (Patient not taking: No sig reported) 15 tablet 5    Drug Regimen Review Drug regimen was reviewed and remains appropriate with no significant issues identified  Home: Home Living Family/patient expects to be discharged to:: Private residence Living Arrangements: Alone Available Help at Discharge: Friend(s),Available PRN/intermittently Transport planner, close friends; can be checked on daily) Type of Home: House Home Access: Stairs to enter Secretary/administrator of Steps: 1 Bathroom Shower/Tub: Health visitor: Standard Home Equipment: Engineer, production - 2 wheels,Cane - quad   Functional History: Prior Function Level of Independence: Independent Comments: I with ADLs/IADLs; driving; cooking/cleaning; working  Functional Status:  Mobility: Bed Mobility Overal bed mobility: Needs Assistance Bed Mobility: Supine to Sit Supine to sit: Supervision Sit to supine: Supervision,HOB elevated General bed mobility comments: Increased time to transition to EOB. HOB flat and no use of rails to simulate home environment. Attempting to utilize momentum to get trunk elevated. Transfers Overall transfer level: Needs assistance Equipment used: None,Rolling walker (2 wheeled) Transfers: Sit to/from Stand Sit to Stand: Supervision,Min guard,From elevated surface Stand pivot transfers: Min guard,Min assist General transfer comment: Attempting to stand without UE use to "show" therapist he could do it. Very unsteady and without a smooth transition to standing. Light min assist required. Ambulation/Gait Ambulation/Gait assistance: Min assist,Min guard Gait Distance (Feet): 200 Feet Assistive device: None,Rolling walker (2 wheeled) Gait Pattern/deviations: Wide base of support,Decreased step length - right,Decreased step length - left,Decreased stride  length General Gait Details: Wide BOS with ambulation without an AD. With RW, pt able to improve overall gait pattern but with occasional unsteadiness and need for assist, more so with higher level balance activity. Gait velocity: Decreased Gait velocity interpretation: <1.8 ft/sec, indicate of risk for recurrent falls Stairs: Yes Stairs assistance: Min guard,Mod assist Stair Management: Two rails,No rails,Forwards Number of Stairs: 5 General stair comments: Attempted stairs without rails, again to "show" therapist he was able to do it, and required mod assist for balance support and safety. With 2 rails, min  guard assist and increased time required.    ADL: ADL Overall ADL's : Needs assistance/impaired Grooming: Oral care,Standing,Supervision/safety Grooming Details (indicate cue type and reason): Handwashing standing at sink level with Min guard. No AD. Upper Body Dressing : Supervision/safety,Set up,Sitting Upper Body Dressing Details (indicate cue type and reason): to don back side cover Lower Body Dressing: Supervision/safety,Sitting/lateral leans Lower Body Dressing Details (indicate cue type and reason): simulated from recliner with pt adjusting socks Toilet Transfer: Minimal assistance,RW,Ambulation Toilet Transfer Details (indicate cue type and reason): simulated via functional mobiltiy with and without AD with pts balance improving with use of AD Functional mobility during ADLs: Minimal assistance,Rolling walker General ADL Comments: pt continues to present with balance deficits but very motivated to work with therapies and return to [facility]  Cognition: Cognition Overall Cognitive Status: Impaired/Different from baseline Orientation Level: Oriented X4 Attention: Selective Selective Attention: Appears intact Memory: Impaired Memory Impairment: Storage deficit Awareness:  (unable to assess) Cognition Arousal/Alertness: Awake/alert Behavior During Therapy: Restless Overall  Cognitive Status: Impaired/Different from baseline Area of Impairment: Memory,Safety/judgement Memory: Decreased short-term memory Safety/Judgement: Decreased awareness of safety,Decreased awareness of deficits Problem Solving: Slow processing General Comments: Very inquisitive, however not remembering answers to his many questions.  Physical Exam: Blood pressure 127/82, pulse 85, temperature 98 F (36.7 C), temperature source Oral, resp. rate 18, height $RemoveBe'5\' 8"'jqddLEonY$  (1.727 m), weight 108.4 kg, SpO2 100 %. Physical Exam Constitutional:      Appearance: He is obese.  HENT:     Head: Normocephalic.     Nose: Nose normal.     Mouth/Throat:     Mouth: Mucous membranes are moist.     Pharynx: Oropharynx is clear.  Eyes:     Extraocular Movements: Extraocular movements intact.     Conjunctiva/sclera: Conjunctivae normal.     Pupils: Pupils are equal, round, and reactive to light.  Cardiovascular:     Rate and Rhythm: Normal rate and regular rhythm.     Heart sounds: No murmur heard. No gallop.   Pulmonary:     Effort: Pulmonary effort is normal. No respiratory distress.     Breath sounds: No wheezing.  Abdominal:     General: There is no distension.     Palpations: Abdomen is soft.     Tenderness: There is no abdominal tenderness.  Musculoskeletal:        General: No swelling or tenderness.     Cervical back: Normal range of motion.  Skin:    General: Skin is warm.  Neurological:     Mental Status: He is alert.     Comments: Alert and oriented x 3. Normal insight and awareness. Intact Memory. Normal language and speech. Cranial nerve exam unremarkable although pt did complain of mild diplopia. Mild right sided limb ataxia and dysmetria. RUE 4+/5. LUE 5/5. RLE 4/5. LLE 4+/5. No focal sensory findings  Psychiatric:        Behavior: Behavior normal.        Thought Content: Thought content normal.     Results for orders placed or performed during the hospital encounter of 10/18/20  (from the past 48 hour(s))  Urine rapid drug screen (hosp performed)     Status: Abnormal   Collection Time: 10/19/20  8:00 AM  Result Value Ref Range   Opiates NONE DETECTED NONE DETECTED   Cocaine NONE DETECTED NONE DETECTED   Benzodiazepines POSITIVE (A) NONE DETECTED   Amphetamines NONE DETECTED NONE DETECTED   Tetrahydrocannabinol NONE DETECTED NONE DETECTED   Barbiturates  NONE DETECTED NONE DETECTED    Comment: (NOTE) DRUG SCREEN FOR MEDICAL PURPOSES ONLY.  IF CONFIRMATION IS NEEDED FOR ANY PURPOSE, NOTIFY LAB WITHIN 5 DAYS.  LOWEST DETECTABLE LIMITS FOR URINE DRUG SCREEN Drug Class                     Cutoff (ng/mL) Amphetamine and metabolites    1000 Barbiturate and metabolites    200 Benzodiazepine                 737 Tricyclics and metabolites     300 Opiates and metabolites        300 Cocaine and metabolites        300 THC                            50 Performed at [facility], [place] [address] [place], [place] [address]   Glucose, capillary     Status: Abnormal   Collection Time: 10/19/20  8:32 AM  Result Value Ref Range   Glucose-Capillary 145 (H) 70 - 99 mg/dL    Comment: Glucose reference range applies only to samples taken after fasting for at least 8 hours.  Glucose, capillary     Status: Abnormal   Collection Time: 10/19/20 12:14 PM  Result Value Ref Range   Glucose-Capillary 223 (H) 70 - 99 mg/dL    Comment: Glucose reference range applies only to samples taken after fasting for at least 8 hours.  Resp Panel by RT-PCR (Flu A&B, Covid) Nasopharyngeal Swab     Status: None   Collection Time: 10/19/20  1:35 PM   Specimen: Nasopharyngeal Swab; Nasopharyngeal(NP) swabs in vial transport medium  Result Value Ref Range   SARS Coronavirus 2 by RT PCR NEGATIVE NEGATIVE    Comment: (NOTE) SARS-CoV-2 target nucleic acids are NOT DETECTED.  The SARS-CoV-2 RNA is generally detectable in upper respiratory specimens during the acute phase of infection. The  lowest concentration of SARS-CoV-2 viral copies this assay can detect is 138 copies/mL. A negative result does not preclude SARS-Cov-2 infection and should not be used as the sole basis for treatment or other patient management decisions. A negative result may occur with  improper specimen collection/handling, submission of specimen other than nasopharyngeal swab, presence of viral mutation(s) within the areas targeted by this assay, and inadequate number of viral copies(<138 copies/mL). A negative result must be combined with clinical observations, patient history, and epidemiological information. The expected result is Negative.  Fact Sheet for Patients:  EntrepreneurPulse.com.au  Fact Sheet for Healthcare Providers:  IncredibleEmployment.be  This test is no t yet approved or cleared by the [place] FDA and  has been authorized for detection and/or diagnosis of SARS-CoV-2 by FDA under an Emergency Use Authorization (EUA). This EUA will remain  in effect (meaning this test can be used) for the duration of the COVID-19 declaration under Section 564(b)(1) of the Act, 21 U.S.C.section 360bbb-3(b)(1), unless the authorization is terminated  or revoked sooner.       Influenza A by PCR NEGATIVE NEGATIVE   Influenza B by PCR NEGATIVE NEGATIVE    Comment: (NOTE) The Xpert Xpress SARS-CoV-2/FLU/RSV plus assay is intended as an aid in the diagnosis of influenza from Nasopharyngeal swab specimens and should not be used as a sole basis for treatment. Nasal washings and aspirates are unacceptable for Xpert Xpress SARS-CoV-2/FLU/RSV testing.  Fact Sheet for Patients: EntrepreneurPulse.com.au  Fact Sheet for  Healthcare Providers: IncredibleEmployment.be  This test is not yet approved or cleared by the [place] and has been authorized for detection and/or diagnosis of SARS-CoV-2 by FDA under an Emergency  Use Authorization (EUA). This EUA will remain in effect (meaning this test can be used) for the duration of the COVID-19 declaration under Section 564(b)(1) of the Act, 21 U.S.C. section 360bbb-3(b)(1), unless the authorization is terminated or revoked.  Performed at [facility], [place] [address] [place], [place] [address]   Glucose, capillary     Status: Abnormal   Collection Time: 10/19/20  5:34 PM  Result Value Ref Range   Glucose-Capillary 169 (H) 70 - 99 mg/dL    Comment: Glucose reference range applies only to samples taken after fasting for at least 8 hours.  Glucose, capillary     Status: Abnormal   Collection Time: 10/19/20  8:01 PM  Result Value Ref Range   Glucose-Capillary 195 (H) 70 - 99 mg/dL    Comment: Glucose reference range applies only to samples taken after fasting for at least 8 hours.  Glucose, capillary     Status: Abnormal   Collection Time: 10/19/20 11:03 PM  Result Value Ref Range   Glucose-Capillary 219 (H) 70 - 99 mg/dL    Comment: Glucose reference range applies only to samples taken after fasting for at least 8 hours.  Glucose, capillary     Status: Abnormal   Collection Time: 10/20/20  3:07 AM  Result Value Ref Range   Glucose-Capillary 151 (H) 70 - 99 mg/dL    Comment: Glucose reference range applies only to samples taken after fasting for at least 8 hours.  Glucose, capillary     Status: Abnormal   Collection Time: 10/20/20  4:57 AM  Result Value Ref Range   Glucose-Capillary 183 (H) 70 - 99 mg/dL    Comment: Glucose reference range applies only to samples taken after fasting for at least 8 hours.  TSH     Status: None   Collection Time: 10/20/20  5:37 AM  Result Value Ref Range   TSH 2.197 0.350 - 4.500 uIU/mL    Comment: Performed by a 3rd Generation assay with a functional sensitivity of <=0.01 uIU/mL. Performed at [facility], [place] [address] [place], [place] [address]   Vitamin B12     Status: None   Collection Time:  10/20/20  5:37 AM  Result Value Ref Range   Vitamin B-12 252 180 - 914 pg/mL    Comment: (NOTE) This assay is not validated for testing neonatal or myeloproliferative syndrome specimens for Vitamin B12 levels. Performed at [facility], [place] [address] [place], [place] [address]   RPR     Status: None   Collection Time: 10/20/20  5:37 AM  Result Value Ref Range   RPR Ser Ql NON REACTIVE NON REACTIVE    Comment: Performed at [facility], [place] [address] [place], [place] [address]  Sedimentation rate     Status: Abnormal   Collection Time: 10/20/20  5:37 AM  Result Value Ref Range   Sed Rate 20 (H) 0 - 16 mm/hr    Comment: Performed at [place] [address] [place], [place] [address]  C-reactive protein     Status: None   Collection Time: 10/20/20  5:37 AM  Result Value Ref Range   CRP <0.5 <1.0 mg/dL    Comment: Performed at [facility], [place]  [address], [place], [place] [address]  Glucose, capillary     Status: Abnormal   Collection Time: 10/20/20  7:50 AM  Result Value Ref Range   Glucose-Capillary 156 (H) 70 - 99 mg/dL    Comment: Glucose reference range applies only to samples taken after fasting for at least 8 hours.  Glucose, capillary     Status: Abnormal   Collection Time: 10/20/20 12:17 PM  Result Value Ref Range   Glucose-Capillary 254 (H) 70 - 99 mg/dL    Comment: Glucose reference range applies only to samples taken after fasting for at least 8 hours.  Glucose, capillary     Status: Abnormal   Collection Time: 10/20/20  4:11 PM  Result Value Ref Range   Glucose-Capillary 173 (H) 70 - 99 mg/dL    Comment: Glucose reference range applies only to samples taken after fasting for at least 8 hours.  Glucose, capillary     Status: Abnormal   Collection Time: 10/20/20  8:44 PM  Result Value Ref Range   Glucose-Capillary 206 (H) 70 - 99 mg/dL    Comment: Glucose reference range applies only to samples taken after fasting for at least 8  hours.  Glucose, capillary     Status: Abnormal   Collection Time: 10/21/20 12:10 AM  Result Value Ref Range   Glucose-Capillary 142 (H) 70 - 99 mg/dL    Comment: Glucose reference range applies only to samples taken after fasting for at least 8 hours.  Glucose, capillary     Status: Abnormal   Collection Time: 10/21/20  4:20 AM  Result Value Ref Range   Glucose-Capillary 141 (H) 70 - 99 mg/dL    Comment: Glucose reference range applies only to samples taken after fasting for at least 8 hours.   DG Chest Port 1 View  Result Date: 10/19/2020 CLINICAL DATA:  59 year old male with left thalamic and other small scattered cerebral infarcts. EXAM: PORTABLE CHEST 1 VIEW COMPARISON:  Chest radiographs 11/05/2019 and earlier. CTA neck 0310 hours today. FINDINGS: Portable AP upright view at 1027 hours. Lung volumes and mediastinal contours remain normal. Visualized tracheal air column is within normal limits. Allowing for portable technique the lungs are clear. No pneumothorax or pleural effusion. Paucity of bowel gas in the upper abdomen. No acute osseous abnormality identified. IMPRESSION: Negative portable chest. Electronically Signed   By: Genevie Ann M.D.   On: 10/19/2020 10:41   ECHOCARDIOGRAM COMPLETE  Result Date: 10/19/2020    ECHOCARDIOGRAM REPORT   Patient Name:   [name] Date of Exam: 10/19/2020 Medical Rec #:  [id number]      Height:       69.0 in Accession #:    [id number]     Weight:       250.5 lb Date of Birth:  Feb 28, 1961      BSA:          2.273 m Patient Age:    29 years       BP:           120/66 mmHg Patient Gender: M              HR:           95 bpm. Exam Location:  Inpatient Procedure: 2D Echo, Cardiac Doppler, Color Doppler and Intracardiac            Opacification Agent Indications:    Stroke I63.9  History:        Patient has prior history of Echocardiogram examinations,  most                 recent 10/28/2019. Risk Factors:Hypertension, Dyslipidemia and                 Diabetes.  Cancer. GERD.  Sonographer:    Jonelle Sidle Dance Referring Phys: [id number]  1. Left ventricular ejection fraction, by estimation, is 55 to 60%. The left ventricle has normal function. The left ventricle demonstrates regional wall motion abnormalities (see scoring diagram/findings for description). There is mild left ventricular  hypertrophy. Left ventricular diastolic parameters are consistent with Grade II diastolic dysfunction (pseudonormalization).  2. Right ventricular systolic function is normal. The right ventricular size is normal.  3. Left atrial size was moderately dilated.  4. The mitral valve is normal in structure. No evidence of mitral valve regurgitation. No evidence of mitral stenosis.  5. The aortic valve is normal in structure. Aortic valve regurgitation is not visualized. No aortic stenosis is present.  6. The inferior vena cava is normal in size with greater than 50% respiratory variability, suggesting right atrial pressure of 3 mmHg. Conclusion(s)/Recommendation(s): No intracardiac source of embolism detected on this transthoracic study. A transesophageal echocardiogram is recommended to exclude cardiac source of embolism if clinically indicated. FINDINGS  Left Ventricle: Left ventricular ejection fraction, by estimation, is 55 to 60%. The left ventricle has normal function. The left ventricle demonstrates regional wall motion abnormalities. Definity contrast agent was given IV to delineate the left ventricular endocardial borders. The left ventricular internal cavity size was normal in size. There is mild left ventricular hypertrophy. Left ventricular diastolic parameters are consistent with Grade II diastolic dysfunction (pseudonormalization).  LV Wall Scoring: The apical septal segment and apex are akinetic. Right Ventricle: The right ventricular size is normal. No increase in right ventricular wall thickness. Right ventricular systolic function is normal. Left Atrium:  Left atrial size was moderately dilated. Right Atrium: Right atrial size was normal in size. Pericardium: There is no evidence of pericardial effusion. Mitral Valve: The mitral valve is normal in structure. Mild mitral annular calcification. No evidence of mitral valve regurgitation. No evidence of mitral valve stenosis. Tricuspid Valve: The tricuspid valve is normal in structure. Tricuspid valve regurgitation is not demonstrated. No evidence of tricuspid stenosis. Aortic Valve: The aortic valve is normal in structure. Aortic valve regurgitation is not visualized. No aortic stenosis is present. Pulmonic Valve: The pulmonic valve was normal in structure. Pulmonic valve regurgitation is not visualized. No evidence of pulmonic stenosis. Aorta: The aortic root is normal in size and structure. Venous: The inferior vena cava is normal in size with greater than 50% respiratory variability, suggesting right atrial pressure of 3 mmHg. IAS/Shunts: No atrial level shunt detected by color flow Doppler.  LEFT VENTRICLE PLAX 2D LVIDd:         4.60 cm  Diastology LVIDs:         3.30 cm  LV e' medial:    8.92 cm/s LV PW:         1.30 cm  LV E/e' medial:  7.8 LV IVS:        1.10 cm  LV e' lateral:   9.90 cm/s LVOT diam:     1.90 cm  LV E/e' lateral: 7.0 LV SV:         52 LV SV Index:   23 LVOT Area:     2.84 cm  RIGHT VENTRICLE             IVC  RV Basal diam:  3.10 cm     IVC diam: 1.80 cm RV Mid diam:    1.70 cm RV S prime:     13.40 cm/s TAPSE (M-mode): 1.9 cm LEFT ATRIUM              Index       RIGHT ATRIUM           Index LA diam:        4.70 cm  2.07 cm/m  RA Area:     15.00 cm LA Vol (A2C):   111.0 ml 48.82 ml/m RA Volume:   31.70 ml  13.94 ml/m LA Vol (A4C):   82.4 ml  36.24 ml/m LA Biplane Vol: 97.3 ml  42.80 ml/m  AORTIC VALVE LVOT Vmax:   94.60 cm/s LVOT Vmean:  62.900 cm/s LVOT VTI:    0.183 m  AORTA Ao Root diam: 3.70 cm Ao Asc diam:  3.10 cm MITRAL VALVE MV Area (PHT): 3.91 cm    SHUNTS MV Decel Time: 194 msec     Systemic VTI:  0.18 m MV E velocity: 69.40 cm/s  Systemic Diam: 1.90 cm MV A velocity: 55.70 cm/s MV E/A ratio:  1.25 Candee Furbish MD Electronically signed by Candee Furbish MD Signature Date/Time: 10/19/2020/4:45:27 PM    Final    VAS [place] LOWER EXTREMITY VENOUS (DVT)  Result Date: 10/19/2020  Lower Venous DVT Study Indications: Stroke.  Comparison Study: No prior study on file Performing Technologist: Sharion Dove RVS  Examination Guidelines: A complete evaluation includes B-mode imaging, spectral Doppler, color Doppler, and power Doppler as needed of all accessible portions of each vessel. Bilateral testing is considered an integral part of a complete examination. Limited examinations for reoccurring indications may be performed as noted. The reflux portion of the exam is performed with the patient in reverse Trendelenburg.  +---------+---------------+---------+-----------+----------+--------------+ RIGHT    CompressibilityPhasicitySpontaneityPropertiesThrombus Aging +---------+---------------+---------+-----------+----------+--------------+ CFV      Full           Yes      Yes                                 +---------+---------------+---------+-----------+----------+--------------+ SFJ      Full                                                        +---------+---------------+---------+-----------+----------+--------------+ FV Prox  Full                                                        +---------+---------------+---------+-----------+----------+--------------+ FV Mid   Full                                                        +---------+---------------+---------+-----------+----------+--------------+ FV DistalFull                                                        +---------+---------------+---------+-----------+----------+--------------+  PFV      Full                                                         +---------+---------------+---------+-----------+----------+--------------+ POP      Full           Yes      Yes                                 +---------+---------------+---------+-----------+----------+--------------+ PTV      Full                                                        +---------+---------------+---------+-----------+----------+--------------+ PERO     Full                                                        +---------+---------------+---------+-----------+----------+--------------+   +---------+---------------+---------+-----------+----------+--------------+ LEFT     CompressibilityPhasicitySpontaneityPropertiesThrombus Aging +---------+---------------+---------+-----------+----------+--------------+ CFV      Full           Yes      Yes                                 +---------+---------------+---------+-----------+----------+--------------+ SFJ      Full                                                        +---------+---------------+---------+-----------+----------+--------------+ FV Prox  Full                                                        +---------+---------------+---------+-----------+----------+--------------+ FV Mid   Full                                                        +---------+---------------+---------+-----------+----------+--------------+ FV DistalFull                                                        +---------+---------------+---------+-----------+----------+--------------+ PFV      Full                                                        +---------+---------------+---------+-----------+----------+--------------+  POP      Full           Yes      Yes                                 +---------+---------------+---------+-----------+----------+--------------+ PTV      Full                                                         +---------+---------------+---------+-----------+----------+--------------+ PERO     Full                                                        +---------+---------------+---------+-----------+----------+--------------+     Summary: BILATERAL: - No evidence of deep vein thrombosis seen in the lower extremities, bilaterally. -   *See table(s) above for measurements and observations. Electronically signed by Monica Martinez MD on 10/19/2020 at 4:23:09 PM.    Final        Medical Problem List and Plan: 1.  Abnormal gait pattern with gait balance difficulty secondary to left thalamic and posterior CR, bilateral occipital punctate, right cerebellum small infarcts, embolic pattern, cryptogenic origin  -patient may shower  -ELOS/Goals: 7-10 days, Mod I goals with PT, OT 2.  Antithrombotics: -DVT/anticoagulation: SCDs  -antiplatelet therapy: Aspirin 81 mg daily and Brilinta 90 mg twice daily 3. Pain Management: Tylenol as needed 4. Mood: Valium 5 mg daily as needed anxiety  -antipsychotic agents: N/A 5. Neuropsych: This patient is capable of making decisions on his own behalf. 6. Skin/Wound Care: Routine skin checks 7. Fluids/Electrolytes/Nutrition: Routine in and outs with follow-up chemistries  -pt requested dietary consult for healthier eating habits 8.  Hypertension.  Cozaar 12.5 mg daily.  Monitor with increased mobility 9.  Diabetes mellitus with peripheral neuropathy.  Hemoglobin A1c 8.7.  Lantus insulin 40 units daily.    -Check blood sugars before meals and at bedtime.    -Diabetic teaching 10.  Hyperlipidemia.  Pravachol 11.  History of CAD with STEMI, continue aspirin 12.  Obesity.  BMI 36.32.  Dietary follow-up 13.  Chronic systolic congestive heart failure.  Monitor for signs of fluid overload.  -check daily weights.  14.  Tobacco abuse.  Counseling 15.  History of colon cancer.  Follow-up outpatient follow-up Dr. Derek Mound, PA-C 10/21/2020

## 2020-10-21 NOTE — Interval H&P Note (Signed)
History and Physical Interval Note:  10/21/2020 8:03 AM  [name]  has presented today for surgery, with the diagnosis of STROKE.  The various methods of treatment have been discussed with the patient and family. After consideration of risks, benefits and other options for treatment, the patient has consented to  Procedure(s): TRANSESOPHAGEAL ECHOCARDIOGRAM (TEE) (N/A) as a surgical intervention.  The patient's history has been reviewed, patient examined, no change in status, stable for surgery.  I have reviewed the patient's chart and labs.  Questions were answered to the patient's satisfaction.     [name]

## 2020-10-21 NOTE — Progress Notes (Signed)
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 right ventricular size is normal.  3. Left atrial size was moderately dilated. No left atrial/left atrial appendage thrombus was detected.  4. The mitral valve is grossly normal. Trivial mitral valve regurgitation.  5. The aortic valve is tricuspid. Aortic valve regurgitation is not visualized. Conclusion(s)/Recommendation(s): No LA/LAA thrombus identified. Negative bubble study for interatrial shunt. No intracardiac source of embolism detected on this on this transesophageal echocardiogram. FINDINGS  Left Ventricle: Left ventricular ejection fraction, by estimation, is 55 to 60%. The left ventricle has normal function. The left ventricular internal cavity size was normal in size. There is mild left ventricular hypertrophy. Right Ventricle: The right ventricular size is normal. No increase in right ventricular wall thickness. Right ventricular systolic function is normal. Left Atrium: Left atrial size was moderately dilated. No left atrial/left atrial appendage thrombus was detected. Right Atrium: Right atrial size was normal in size. Pericardium: There  is no evidence of pericardial effusion. Mitral Valve: The mitral valve is grossly normal. Trivial mitral valve regurgitation. Tricuspid Valve: The tricuspid  valve is grossly normal. Tricuspid valve regurgitation is trivial. Aortic Valve: The aortic valve is tricuspid. Aortic valve regurgitation is not visualized. Pulmonic Valve: The pulmonic valve was normal in structure. Pulmonic valve regurgitation is not visualized. Aorta: The aortic root and ascending aorta are structurally normal, with no evidence of dilitation. IAS/Shunts: No atrial level shunt detected by color flow Doppler. Agitated saline contrast was given intravenously to evaluate for intracardiac shunting. Lyman Bishop MD Electronically signed by Lyman Bishop MD Signature Date/Time: 10/21/2020/2:25:10 PM    Final    VAS [place] LOWER EXTREMITY VENOUS (DVT)  Result Date: 10/19/2020  Lower Venous DVT Study Indications: Stroke.  Comparison Study: No prior study on file Performing Technologist: Sharion Dove RVS  Examination Guidelines: A complete evaluation includes B-mode imaging, spectral Doppler, color Doppler, and power Doppler as needed of all accessible portions of each vessel. Bilateral testing is considered an integral part of a complete examination. Limited examinations for reoccurring indications may be performed as noted. The reflux portion of the exam is performed with the patient in reverse Trendelenburg.  +---------+---------------+---------+-----------+----------+--------------+ RIGHT    CompressibilityPhasicitySpontaneityPropertiesThrombus Aging +---------+---------------+---------+-----------+----------+--------------+ CFV      Full           Yes      Yes                                 +---------+---------------+---------+-----------+----------+--------------+ SFJ      Full                                                        +---------+---------------+---------+-----------+----------+--------------+ FV Prox  Full                                                        +---------+---------------+---------+-----------+----------+--------------+ FV Mid   Full                                                         +---------+---------------+---------+-----------+----------+--------------+ FV DistalFull                                                        +---------+---------------+---------+-----------+----------+--------------+ PFV      Full                                                        +---------+---------------+---------+-----------+----------+--------------+ POP      Full           Yes  Yes                                 +---------+---------------+---------+-----------+----------+--------------+ PTV      Full                                                        +---------+---------------+---------+-----------+----------+--------------+ PERO     Full                                                        +---------+---------------+---------+-----------+----------+--------------+   +---------+---------------+---------+-----------+----------+--------------+ LEFT     CompressibilityPhasicitySpontaneityPropertiesThrombus Aging +---------+---------------+---------+-----------+----------+--------------+ CFV      Full           Yes      Yes                                 +---------+---------------+---------+-----------+----------+--------------+ SFJ      Full                                                        +---------+---------------+---------+-----------+----------+--------------+ FV Prox  Full                                                        +---------+---------------+---------+-----------+----------+--------------+ FV Mid   Full                                                        +---------+---------------+---------+-----------+----------+--------------+ FV DistalFull                                                        +---------+---------------+---------+-----------+----------+--------------+ PFV      Full                                                         +---------+---------------+---------+-----------+----------+--------------+ POP      Full           Yes      Yes                                 +---------+---------------+---------+-----------+----------+--------------+ PTV      Full                                                        +---------+---------------+---------+-----------+----------+--------------+  PERO     Full                                                        +---------+---------------+---------+-----------+----------+--------------+     Summary: BILATERAL: - No evidence of deep vein thrombosis seen in the lower extremities, bilaterally. -   *See table(s) above for measurements and observations. Electronically signed by Monica Martinez MD on 10/19/2020 at 4:23:09 PM.    Final     PHYSICAL EXAM  Temp:  [97.7 F (36.5 C)-98.9 F (37.2 C)] 98.7 F (37.1 C) (04/05 1129) Pulse Rate:  [76-95] 95 (04/05 1533) Resp:  [13-20] 20 (04/05 1129) BP: (115-147)/(63-83) 147/73 (04/05 1533) SpO2:  [98 %-100 %] 100 % (04/05 0920) Weight:  [113.4 kg] 113.4 kg (04/05 0731)  General - Well nourished, well developed, in no apparent distress.  Ophthalmologic - fundi not visualized due to noncooperation.  Cardiovascular - Regular rhythm and rate.  Mental Status -  Level of arousal and orientation to time, place, and person were intact. Language including expression, naming, repetition, comprehension was assessed and found intact.  Cranial Nerves II - XII - II - Visual field intact OU. III, IV, VI - Extraocular movements intact. V - Facial sensation intact bilaterally. VII - Facial movement intact bilaterally. VIII - Hearing & vestibular intact bilaterally. X - Palate elevates symmetrically. XI - Chin turning & shoulder shrug intact bilaterally. XII - Tongue protrusion intact.  Motor Strength - The patient's strength was normal in all extremities and pronator drift was absent.  Bulk was normal and  fasciculations were absent.   Motor Tone - Muscle tone was assessed at the neck and appendages and was normal.  Reflexes - The patient's reflexes were symmetrical in all extremities and he had no pathological reflexes.  Sensory - Light touch, temperature/pinprick were assessed and were symmetrical.    Coordination - The patient had normal movements in the left hand and foot with no ataxia or dysmetria.  However mild right finger-to-nose and heel-to-shin dysmetria.  Tremor was absent.  Gait and Station - deferred.   ASSESSMENT/PLAN Mr. Milas Schappell is a 60 y.o. male with history of diabetes, hypertension, hyperlipidemia, obesity, colon cancer, CAD stent MI status post stenting 10/2019 admitted for imbalance and falling. No tPA given due to outside window.    Stroke: Left thalamic and posterior CR, bilateral occipital punctate, right cerebellum small infarcts, embolic pattern, secondary to unclear source  Resultant right sided impaired coordination  CT no acute abnormality  CTA head and neck left P2 severe stenosis, bilateral ICA bulb and siphon atherosclerosis  MRI left thalamic and left posterior CR infarcts, punctate bilateral occipital and right cerebellar infarcts.  2D Echo EF 55 to 60%  LE venous Doppler no DVT  TEE unremarkable, no PFO, EF 55-60%  Loop recorder placed  LDL 118  HgbA1c 8.7  Hypercoagulable work-up negative so far, some still pending  SCDs for VTE prophylaxis  aspirin 81 mg daily and Brilinta (ticagrelor) 90 mg bid prior to admission, now on aspirin 81 mg daily and Brilinta (ticagrelor) 90 mg bid.  Continue on discharge.  Patient counseled to be compliant with his antithrombotic medications  Ongoing aggressive stroke risk factor management  Therapy recommendations: CIR  Disposition: Pending  Diabetes  HgbA1c 8.7 goal < 7.0  Uncontrolled  Currently on  Lantus  CBG monitoring  SSI  DM education and close PCP follow  up  Hypertension . Stable  Long term BP goal normotensive  Hyperlipidemia  Home meds: None, Lipitor and the Crestor causing muscle pain  LDL 118, goal < 70  Patient agreed to try lowering intensity statin - now on pravastatin 20  Continue statin at discharge  Other Stroke Risk Factors  CAD/MI 10/2019, on aspirin Brilinta PTA  Obesity  Other Active Problems  Colon cancer  Hospital day # 2  Neurology will sign off. Please call with questions. Pt will follow up with stroke clinic NP at [facility] in about 4 weeks. Thanks for the consult.   Rosalin Hawking, MD PhD Stroke Neurology 10/21/2020 5:08 PM    To contact Stroke Continuity provider, please refer to [email or web address] After hours, contact General Neurology

## 2020-10-21 NOTE — H&P (View-Only) (Signed)
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      Height:       69.0 in Accession #:    [id number]     Weight:       250.5 lb Date of Birth:  06-29-1961      BSA:          2.273 m Patient Age:    84 years       BP:           120/66 mmHg Patient Gender: M              HR:           95 bpm. Exam Location:  Inpatient Procedure: 2D Echo, Cardiac Doppler, Color Doppler and Intracardiac            Opacification Agent Indications:    Stroke I63.9  History:        Patient has prior history of Echocardiogram examinations, most                 recent 10/28/2019. Risk Factors:Hypertension, Dyslipidemia and                 Diabetes. Cancer. GERD.  Sonographer:    Jonelle Sidle Dance Referring Phys: [id number]  1. Left ventricular ejection fraction, by estimation, is 55 to 60%. The left ventricle has normal function.  The left ventricle demonstrates regional wall motion abnormalities (see scoring diagram/findings for description). There is mild left ventricular  hypertrophy. Left ventricular diastolic parameters are consistent with Grade II diastolic dysfunction (pseudonormalization).  2. Right ventricular systolic function is normal. The right ventricular size is normal.  3. Left atrial size was moderately dilated.  4. The mitral valve is normal in  structure. No evidence of mitral valve regurgitation. No evidence of mitral stenosis.  5. The aortic valve is normal in structure. Aortic valve regurgitation is not visualized. No aortic stenosis is present.  6. The inferior vena cava is normal in size with greater than 50% respiratory variability, suggesting right atrial pressure of 3 mmHg. Conclusion(s)/Recommendation(s): No intracardiac source of embolism detected on this transthoracic study. A transesophageal echocardiogram is recommended to exclude cardiac source of embolism if clinically indicated. FINDINGS  Left Ventricle: Left ventricular ejection fraction, by estimation, is 55 to 60%. The left ventricle has normal function. The left ventricle demonstrates regional wall motion abnormalities. Definity contrast agent was given IV to delineate the left ventricular endocardial borders. The left ventricular internal cavity size was normal in size. There is mild left ventricular hypertrophy. Left ventricular diastolic parameters are consistent with Grade II diastolic dysfunction (pseudonormalization).  LV Wall Scoring: The apical septal segment and apex are akinetic. Right Ventricle: The right ventricular size is normal. No increase in right ventricular wall thickness. Right ventricular systolic function is normal. Left Atrium: Left atrial size was moderately dilated. Right Atrium: Right atrial size was normal in size. Pericardium: There is no evidence of pericardial effusion. Mitral Valve: The mitral valve is normal in structure. Mild mitral annular calcification. No evidence of mitral valve regurgitation. No evidence of mitral valve stenosis. Tricuspid Valve: The tricuspid valve is normal in structure. Tricuspid valve regurgitation is not demonstrated. No evidence of tricuspid stenosis. Aortic Valve: The aortic valve is normal in structure. Aortic valve regurgitation is not visualized. No aortic stenosis is present. Pulmonic Valve: The pulmonic valve was normal  in structure. Pulmonic valve regurgitation is not visualized. No evidence of pulmonic stenosis. Aorta: The aortic root is normal in size and structure. Venous: The inferior vena cava is normal in size with greater than 50% respiratory variability, suggesting right atrial pressure of 3 mmHg. IAS/Shunts: No atrial level shunt detected by color flow Doppler.  LEFT VENTRICLE PLAX 2D LVIDd:         4.60 cm  Diastology LVIDs:         3.30 cm  LV e' medial:    8.92 cm/s LV PW:         1.30 cm  LV E/e' medial:  7.8 LV IVS:        1.10 cm  LV e' lateral:   9.90 cm/s LVOT diam:     1.90 cm  LV E/e' lateral: 7.0 LV SV:         52 LV SV Index:   23 LVOT Area:     2.84 cm  RIGHT VENTRICLE             IVC RV Basal diam:  3.10 cm     IVC diam: 1.80 cm RV Mid diam:    1.70 cm RV S prime:     13.40 cm/s TAPSE (M-mode): 1.9 cm LEFT ATRIUM              Index       RIGHT ATRIUM           Index LA diam:  4.70 cm  2.07 cm/m  RA Area:     15.00 cm LA Vol (A2C):   111.0 ml 48.82 ml/m RA Volume:   31.70 ml  13.94 ml/m LA Vol (A4C):   82.4 ml  36.24 ml/m LA Biplane Vol: 97.3 ml  42.80 ml/m  AORTIC VALVE LVOT Vmax:   94.60 cm/s LVOT Vmean:  62.900 cm/s LVOT VTI:    0.183 m  AORTA Ao Root diam: 3.70 cm Ao Asc diam:  3.10 cm MITRAL VALVE MV Area (PHT): 3.91 cm    SHUNTS MV Decel Time: 194 msec    Systemic VTI:  0.18 m MV E velocity: 69.40 cm/s  Systemic Diam: 1.90 cm MV A velocity: 55.70 cm/s MV E/A ratio:  1.25 Candee Furbish MD Electronically signed by Candee Furbish MD Signature Date/Time: 10/19/2020/4:45:27 PM    Final    VAS [place] LOWER EXTREMITY VENOUS (DVT)  Result Date: 10/19/2020  Lower Venous DVT Study Indications: Stroke.  Comparison Study: No prior study on file Performing Technologist: Sharion Dove RVS  Examination Guidelines: A complete evaluation includes B-mode imaging, spectral Doppler, color Doppler, and power Doppler as needed of all accessible portions of each vessel. Bilateral testing is considered an integral part  of a complete examination. Limited examinations for reoccurring indications may be performed as noted. The reflux portion of the exam is performed with the patient in reverse Trendelenburg.  +---------+---------------+---------+-----------+----------+--------------+ RIGHT    CompressibilityPhasicitySpontaneityPropertiesThrombus Aging +---------+---------------+---------+-----------+----------+--------------+ CFV      Full           Yes      Yes                                 +---------+---------------+---------+-----------+----------+--------------+ SFJ      Full                                                        +---------+---------------+---------+-----------+----------+--------------+ FV Prox  Full                                                        +---------+---------------+---------+-----------+----------+--------------+ FV Mid   Full                                                        +---------+---------------+---------+-----------+----------+--------------+ FV DistalFull                                                        +---------+---------------+---------+-----------+----------+--------------+ PFV      Full                                                        +---------+---------------+---------+-----------+----------+--------------+  POP      Full           Yes      Yes                                 +---------+---------------+---------+-----------+----------+--------------+ PTV      Full                                                        +---------+---------------+---------+-----------+----------+--------------+ PERO     Full                                                        +---------+---------------+---------+-----------+----------+--------------+   +---------+---------------+---------+-----------+----------+--------------+ LEFT     CompressibilityPhasicitySpontaneityPropertiesThrombus Aging  +---------+---------------+---------+-----------+----------+--------------+ CFV      Full           Yes      Yes                                 +---------+---------------+---------+-----------+----------+--------------+ SFJ      Full                                                        +---------+---------------+---------+-----------+----------+--------------+ FV Prox  Full                                                        +---------+---------------+---------+-----------+----------+--------------+ FV Mid   Full                                                        +---------+---------------+---------+-----------+----------+--------------+ FV DistalFull                                                        +---------+---------------+---------+-----------+----------+--------------+ PFV      Full                                                        +---------+---------------+---------+-----------+----------+--------------+ POP      Full           Yes      Yes                                 +---------+---------------+---------+-----------+----------+--------------+  PTV      Full                                                        +---------+---------------+---------+-----------+----------+--------------+ PERO     Full                                                        +---------+---------------+---------+-----------+----------+--------------+     Summary: BILATERAL: - No evidence of deep vein thrombosis seen in the lower extremities, bilaterally. -   *See table(s) above for measurements and observations. Electronically signed by Monica Martinez MD on 10/19/2020 at 4:23:09 PM.    Final     12-lead ECG on arrival EKG showed NSR at 92 bpm  (personally reviewed) All prior EKG's in EPIC reviewed with no documented atrial fibrillation  Telemetry NSR 70-80s (personally reviewed)  Assessment and Plan:  1. Cryptogenic stroke The patient  presents with cryptogenic stroke.  The patient does have a TEE planned for this AM.  I spoke at length with the patient about monitoring for afib with an implantable loop recorder.  Risks, benefits, and alteratives to implantable loop recorder were discussed with the patient today.   At this time, the patient is very clear in their decision to proceed with implantable loop recorder.   2. CAD s/p DES 10/2019 Denies ischemic symptoms EF has improved from last year with revascularization.  Wound care was reviewed with the patient (keep incision clean and dry for 3 days).  Wound check scheduled and entered in AVS. Please call with questions.   Shirley Friar, PA-C 10/21/2020 7:57 AM   I have seen, examined the patient, and reviewed the above assessment and plan.  Changes to above are made where necessary.  On exam, RRR.  I agree with Dr Erlinda Hong that ILR is indicated to evaluate for afib as the cause of stroke.  Risks and benefits to ILR implantation were discussed with the patient who wishes to proceed.  Co Sign: [name], MD 10/21/2020 12:19 PM

## 2020-10-21 NOTE — CV Procedure (Signed)
TRANSESOPHAGEAL ECHOCARDIOGRAM (TEE) NOTE  INDICATIONS: cryptogenic stroke  PROCEDURE:   Informed consent was obtained prior to the procedure. The risks, benefits and alternatives for the procedure were discussed and the patient comprehended these risks.  Risks include, but are not limited to, cough, sore throat, vomiting, nausea, somnolence, esophageal and stomach trauma or perforation, bleeding, low blood pressure, aspiration, pneumonia, infection, trauma to the teeth and death.    After a procedural time-out, the patient was given propofol per anesthesia for sedation.  The patient's heart rate, blood pressure, and oxygen saturation are monitored continuously during the procedure. The transesophageal probe was inserted in the esophagus and stomach without difficulty and multiple views were obtained.  The patient was kept under observation until the patient left the procedure room.  I was present face-to-face 100% of this time. The patient left the procedure room in stable condition.   Agitated microbubble saline contrast was not administered.  COMPLICATIONS:    There were no immediate complications.  Findings:  1. LEFT VENTRICLE: The left ventricular wall thickness is mildly increased.  The left ventricular cavity is normal in size. Wall motion is normal.  LVEF is 55-60%.  2. RIGHT VENTRICLE:  The right ventricle is normal in structure and function without any thrombus or masses.    3. LEFT ATRIUM:  The left atrium is moderately dilated in size without any thrombus or masses.  There is not spontaneous echo contrast ("smoke") in the left atrium consistent with a low flow state.  4. LEFT ATRIAL APPENDAGE:  The left atrial appendage is free of any thrombus or masses. The appendage has single lobes. Pulse doppler indicates moderate flow in the appendage.  5. ATRIAL SEPTUM:  The atrial septum appears intact and is free of thrombus and/or masses.  There is no evidence for interatrial  shunting by color doppler and saline microbubble.  6. RIGHT ATRIUM:  The right atrium is normal in size and function without any thrombus or masses.  7. MITRAL VALVE:  The mitral valve is normal in structure and function with trivial regurgitation.  There were no vegetations or stenosis.  8. AORTIC VALVE:  The aortic valve is trileaflet, normal in structure and function with no regurgitation.  There were no vegetations or stenosis  9. TRICUSPID VALVE:  The tricuspid valve is normal in structure and function with trivial regurgitation.  There were no vegetations or stenosis  10.  PULMONIC VALVE:  The pulmonic valve is normal in structure and function with no regurgitation.  There were no vegetations or stenosis.   11. AORTIC ARCH, ASCENDING AND DESCENDING AORTA:  There was no Ron Parker et. Al, 1992) atherosclerosis of the ascending aorta, aortic arch, or proximal descending aorta.  12. PULMONARY VEINS: Anomalous pulmonary venous return was not noted.  13. PERICARDIUM: The pericardium appeared normal and non-thickened.  There is no pericardial effusion.  IMPRESSION:   1. No cardiac source of embolus. 2. No LAA thrombus 3. Negative for PFO 4. Trivial MR 5. Mild LVH 6. Moderate LAE 7. LVEF 55-60%  RECOMMENDATIONS:    1.  Proceed with ILR for work-up of cryptogenic stroke.  Time Spent Directly with the Patient:  30 minutes   [name], MD, [facility], [place] Director of the [facility] Lipid [facility]  Card[facility] Diplomate of the American Board of Clinical Lipidology Attending Cardiologist  Direct Dial: [phone]  Fax: [phone]  Website:  [email or web address]  [name] 10/21/2020,  9:45 AM

## 2020-10-21 NOTE — Progress Notes (Signed)
Occupational Therapy Treatment Patient Details Name: [name] MRN: [id number] DOB: [birth year] Today's Date: 10/21/2020    History of present illness 60 y.o. male presenting with fall and possible syncope, balance disturbance, and nausea. 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 PMHx significant for CAD, STEMI s/p CAGB 2021, tobacco abuse, obesity, Hx of colon cancer, DMII, HTN, HLD, and systolic CHF.   OT comments  Pt. Was seen to work on increasing ability to care for himself. Pt. Has decreased safety awareness and awareness of deficits. Pt. Has decreased fmc and requires extra time to preform adl tasks. Pt. Has slowed gaits and decreased balance. Pt. Has decreased memory and needed cues to orient to date. Pt. Does want to go to cir for rehabilitation. Acute OT to follow.   Follow Up Recommendations  CIR    Equipment Recommendations       Recommendations for Other Services      Precautions / Restrictions Precautions Precautions: Fall       Mobility Bed Mobility Overal bed mobility: Needs Assistance Bed Mobility: Supine to Sit     Supine to sit: Supervision          Transfers       Sit to Stand: Min guard Stand pivot transfers: Min guard            Balance     Sitting balance-Leahy Scale: Good       Standing balance-Leahy Scale: Fair                             ADL either performed or assessed with clinical judgement   ADL Overall ADL's : Needs assistance/impaired     Grooming: Wash/dry hands;Wash/dry face;Oral care;Standing           Upper Body Dressing : Supervision/safety;Set up;Sitting   Lower Body Dressing: Supervision/safety;Set up;Sit to/from stand   Toilet Transfer: Min guard;Ambulation;Regular Toilet;Grab bars           Functional mobility during ADLs: Min guard General ADL Comments: Pt. is able to use R UE but does have slowed coordination .     Vision    Vision Assessment?: No apparent visual deficits   Perception     Praxis      Cognition Arousal/Alertness: Awake/alert Behavior During Therapy: Restless Overall Cognitive Status: Impaired/Different from baseline Area of Impairment: Memory;Safety/judgement                     Memory: Decreased short-term memory   Safety/Judgement: Decreased awareness of safety;Decreased awareness of deficits   Problem Solving: Slow processing          Exercises     Shoulder Instructions       General Comments      Pertinent Vitals/ Pain          Home Living                                          Prior Functioning/Environment              Frequency  Min 3X/week        Progress Toward Goals  OT Goals(current goals can now be found in the care plan section)     Acute Rehab OT Goals Patient Stated Goal: to return to [facility]; return to work  OT Goal Formulation: With patient Time For Goal Achievement: 11/02/20  Plan Discharge plan remains appropriate;Frequency remains appropriate    Co-evaluation                 AM-PAC OT "6 Clicks" Daily Activity     Outcome Measure   Help from another person eating meals?: None Help from another person taking care of personal grooming?: A Little Help from another person toileting, which includes using toliet, bedpan, or urinal?: A Little Help from another person bathing (including washing, rinsing, drying)?: A Little Help from another person to put on and taking off regular upper body clothing?: A Little Help from another person to put on and taking off regular lower body clothing?: A Little 6 Click Score: 19    End of Session    OT Visit Diagnosis: Unsteadiness on feet (R26.81);Muscle weakness (generalized) (M62.81);Hemiplegia and hemiparesis Hemiplegia - Right/Left: Right Hemiplegia - dominant/non-dominant: Non-Dominant Hemiplegia - caused by: Cerebral infarction   Activity Tolerance  Patient tolerated treatment well   Patient Left in bed;with call bell/phone within reach;with bed alarm set   Nurse Communication  (ok therapy)        Time:  -     Charges:    Reece Packer OT/L    [place] 10/21/2020, 10:38 AM

## 2020-10-22 ENCOUNTER — Encounter (HOSPITAL_COMMUNITY): Payer: Self-pay | Admitting: Internal Medicine

## 2020-10-22 DIAGNOSIS — I639 Cerebral infarction, unspecified: Secondary | ICD-10-CM | POA: Diagnosis not present

## 2020-10-22 LAB — CBC WITH DIFFERENTIAL/PLATELET
Abs Immature Granulocytes: 0.03 10*3/uL (ref 0.00–0.07)
Basophils Absolute: 0.1 10*3/uL (ref 0.0–0.1)
Basophils Relative: 1 %
Eosinophils Absolute: 0.4 10*3/uL (ref 0.0–0.5)
Eosinophils Relative: 5 %
HCT: 37.1 % — ABNORMAL LOW (ref 39.0–52.0)
Hemoglobin: 12.9 g/dL — ABNORMAL LOW (ref 13.0–17.0)
Immature Granulocytes: 0 %
Lymphocytes Relative: 29 %
Lymphs Abs: 2.1 10*3/uL (ref 0.7–4.0)
MCH: 33.2 pg (ref 26.0–34.0)
MCHC: 34.8 g/dL (ref 30.0–36.0)
MCV: 95.6 fL (ref 80.0–100.0)
Monocytes Absolute: 0.8 10*3/uL (ref 0.1–1.0)
Monocytes Relative: 11 %
Neutro Abs: 3.8 10*3/uL (ref 1.7–7.7)
Neutrophils Relative %: 54 %
Platelets: 253 10*3/uL (ref 150–400)
RBC: 3.88 MIL/uL — ABNORMAL LOW (ref 4.22–5.81)
RDW: 13.1 % (ref 11.5–15.5)
WBC: 7.2 10*3/uL (ref 4.0–10.5)
nRBC: 0 % (ref 0.0–0.2)

## 2020-10-22 LAB — COMPREHENSIVE METABOLIC PANEL
ALT: 30 U/L (ref 0–44)
AST: 21 U/L (ref 15–41)
Albumin: 3 g/dL — ABNORMAL LOW (ref 3.5–5.0)
Alkaline Phosphatase: 38 U/L (ref 38–126)
Anion gap: 5 (ref 5–15)
BUN: 9 mg/dL (ref 6–20)
CO2: 23 mmol/L (ref 22–32)
Calcium: 8.4 mg/dL — ABNORMAL LOW (ref 8.9–10.3)
Chloride: 111 mmol/L (ref 98–111)
Creatinine, Ser: 0.86 mg/dL (ref 0.61–1.24)
GFR, Estimated: >60 mL/min (ref >60)
Glucose, Bld: 137 mg/dL — ABNORMAL HIGH (ref 70–99)
Potassium: 3.4 mmol/L — ABNORMAL LOW (ref 3.5–5.1)
Sodium: 139 mmol/L (ref 135–145)
Total Bilirubin: 1.2 mg/dL (ref 0.3–1.2)
Total Protein: 5.9 g/dL — ABNORMAL LOW (ref 6.5–8.1)

## 2020-10-22 LAB — GLUCOSE, CAPILLARY
Glucose-Capillary: 146 mg/dL — ABNORMAL HIGH (ref 70–99)
Glucose-Capillary: 148 mg/dL — ABNORMAL HIGH (ref 70–99)
Glucose-Capillary: 159 mg/dL — ABNORMAL HIGH (ref 70–99)
Glucose-Capillary: 165 mg/dL — ABNORMAL HIGH (ref 70–99)
Glucose-Capillary: 191 mg/dL — ABNORMAL HIGH (ref 70–99)

## 2020-10-22 MED ORDER — INSULIN ASPART 100 UNIT/ML ~~LOC~~ SOLN
0.0000 [IU] | Freq: Three times a day (TID) | SUBCUTANEOUS | Status: DC
  Administered 2020-10-22: 2 [IU] via SUBCUTANEOUS
  Administered 2020-10-23: 1 [IU] via SUBCUTANEOUS
  Administered 2020-10-23 (×3): 2 [IU] via SUBCUTANEOUS
  Administered 2020-10-24: 1 [IU] via SUBCUTANEOUS
  Administered 2020-10-24 (×2): 2 [IU] via SUBCUTANEOUS
  Administered 2020-10-24: 1 [IU] via SUBCUTANEOUS
  Administered 2020-10-25 (×3): 2 [IU] via SUBCUTANEOUS
  Administered 2020-10-25: 1 [IU] via SUBCUTANEOUS
  Administered 2020-10-26 (×2): 2 [IU] via SUBCUTANEOUS
  Administered 2020-10-26: 1 [IU] via SUBCUTANEOUS
  Administered 2020-10-26: 2 [IU] via SUBCUTANEOUS
  Administered 2020-10-27: 1 [IU] via SUBCUTANEOUS
  Administered 2020-10-27: 3 [IU] via SUBCUTANEOUS
  Administered 2020-10-27 – 2020-10-28 (×2): 1 [IU] via SUBCUTANEOUS

## 2020-10-22 MED ORDER — POTASSIUM CHLORIDE CRYS ER 10 MEQ PO TBCR
10.0000 meq | EXTENDED_RELEASE_TABLET | Freq: Every day | ORAL | Status: DC
  Administered 2020-10-22 – 2020-10-27 (×6): 10 meq via ORAL
  Filled 2020-10-22 (×8): qty 1

## 2020-10-22 MED ORDER — TRAZODONE HCL 50 MG PO TABS
50.0000 mg | ORAL_TABLET | Freq: Every evening | ORAL | Status: DC | PRN
  Administered 2020-10-22: 50 mg via ORAL
  Filled 2020-10-22: qty 1

## 2020-10-22 MED ORDER — INSULIN ASPART 100 UNIT/ML ~~LOC~~ SOLN
0.0000 [IU] | Freq: Three times a day (TID) | SUBCUTANEOUS | Status: DC
Start: 2020-10-23 — End: 2020-10-22

## 2020-10-22 MED ORDER — CLOBETASOL PROPIONATE 0.05 % EX CREA
TOPICAL_CREAM | Freq: Every day | CUTANEOUS | Status: DC | PRN
  Filled 2020-10-22: qty 15

## 2020-10-22 MED ORDER — METOPROLOL SUCCINATE ER 25 MG PO TB24
25.0000 mg | ORAL_TABLET | Freq: Every day | ORAL | Status: DC
  Administered 2020-10-22 – 2020-10-27 (×6): 25 mg via ORAL
  Filled 2020-10-22 (×7): qty 1

## 2020-10-22 MED ORDER — METHOTREXATE 2.5 MG PO TABS: 2.5000 mg | ORAL_TABLET | ORAL | Status: DC | PRN

## 2020-10-22 NOTE — Progress Notes (Signed)
Spoke with [name] to clarify home Methotrexate dosage and regimen. He would prefer NOT to resume it at this time and wait until discharge. Chatted PA.  Virgilene Stryker S. Alford Highland, PharmD, BCPS Clinical Staff Pharmacist [email or web address]

## 2020-10-22 NOTE — Plan of Care (Signed)
Nutrition Education Note  RD consulted for nutrition education regarding a Heart Healthy/Carb Modified diet.  Spoke with pt at bedside. Pt in good spirits. He does not know exactly how long he has had diabetes or how long he has had issues with his cholesterol.  Pt reports that PTA he did a lot of traveling with work. Because of this, pt would eat meals on the road from fast food restaurants (example: 2 cheeseburgers from [employer]). Pt reports that he did not do a lot of cooking. Pt reports that at one point in 2021, he was able to get his hemoglobin A1C down to 7.5. Pt reports that he was out of work at this time and did have more time to cook at home.  Lipid Panel     Component Value Date/Time   CHOL 196 10/19/2020 0116   CHOL 949 (H) 10/27/2014 1322   TRIG 254 (H) 10/19/2020 0116   TRIG 1,098 (H) 10/30/2014 0605   HDL 27 (L) 10/19/2020 0116   HDL 20 (L) 10/27/2014 1322   CHOLHDL 7.3 10/19/2020 0116   VLDL 51 (H) 10/19/2020 0116   VLDL SEE COMMENT 10/27/2014 1322   LDLCALC 118 (H) 10/19/2020 0116   LDLCALC SEE COMMENT 10/27/2014 1322    RD provided "Heart Healthy, Consistent Carbohydrate Nutrition Therapy" handout from the Academy of Nutrition and Dietetics. Reviewed patient's dietary recall. Provided examples on ways to decrease sodium and fat intake in diet. Discouraged intake of processed foods and use of salt shaker. Encouraged fresh fruits and vegetables as well as whole grain sources of carbohydrates to maximize fiber intake.  Lab Results  Component Value Date   HGBA1C 8.7 (H) 10/19/2020    Discussed different food groups and their effects on blood sugar, emphasizing carbohydrate-containing foods. Provided list of carbohydrates and recommended serving sizes of common foods.  Discussed importance of controlled and consistent carbohydrate intake throughout the day. Provided examples of ways to balance meals/snacks and encouraged intake of high-fiber, whole grain complex  carbohydrates. Teach back method used.  Expect good compliance.  Current diet order is Heart Healthy/Carb Modified, patient is consuming approximately 100% of meals at this time. Labs and medications reviewed. No further nutrition interventions warranted at this time. RD contact information provided. If additional nutrition issues arise, please re-consult RD.   Gustavus Bryant, MS, RD, LDN Inpatient Clinical Dietitian Please see [facility] for contact information.

## 2020-10-22 NOTE — Plan of Care (Signed)
Problem: RH Balance Goal: LTG: Patient will maintain dynamic sitting balance (OT) Description: LTG:  Patient will maintain dynamic sitting balance with assistance during activities of daily living (OT) Flowsheets (Taken 10/22/2020 1216) LTG: Pt will maintain dynamic sitting balance during ADLs with: Independent Goal: LTG Patient will maintain dynamic standing with ADLs (OT) Description: LTG:  Patient will maintain dynamic standing balance with assist during activities of daily living (OT)  Flowsheets (Taken 10/22/2020 1216) LTG: Pt will maintain dynamic standing balance during ADLs with: Independent with assistive device   Problem: Sit to Stand Goal: LTG:  Patient will perform sit to stand in prep for activites of daily living with assistance level (OT) Description: LTG:  Patient will perform sit to stand in prep for activites of daily living with assistance level (OT) Flowsheets (Taken 10/22/2020 1216) LTG: PT will perform sit to stand in prep for activites of daily living with assistance level: Independent with assistive device   Problem: RH Grooming Goal: LTG Patient will perform grooming w/assist,cues/equip (OT) Description: LTG: Patient will perform grooming with assist, with/without cues using equipment (OT) Flowsheets (Taken 10/22/2020 1216) LTG: Pt will perform grooming with assistance level of: Independent with assistive device    Problem: RH Bathing Goal: LTG Patient will bathe all body parts with assist levels (OT) Description: LTG: Patient will bathe all body parts with assist levels (OT) Flowsheets (Taken 10/22/2020 1216) LTG: Pt will perform bathing with assistance level/cueing: Independent with assistive device    Problem: RH Dressing Goal: LTG Patient will perform upper body dressing (OT) Description: LTG Patient will perform upper body dressing with assist, with/without cues (OT). Flowsheets (Taken 10/22/2020 1216) LTG: Pt will perform upper body dressing with assistance level  of: Independent Goal: LTG Patient will perform lower body dressing w/assist (OT) Description: LTG: Patient will perform lower body dressing with assist, with/without cues in positioning using equipment (OT) Flowsheets (Taken 10/22/2020 1216) LTG: Pt will perform lower body dressing with assistance level of: Independent with assistive device   Problem: RH Toileting Goal: LTG Patient will perform toileting task (3/3 steps) with assistance level (OT) Description: LTG: Patient will perform toileting task (3/3 steps) with assistance level (OT)  Flowsheets (Taken 10/22/2020 1216) LTG: Pt will perform toileting task (3/3 steps) with assistance level: Independent with assistive device   Problem: RH Functional Use of Upper Extremity Goal: LTG Patient will use RT/LT upper extremity as a (OT) Description: LTG: Patient will use right/left upper extremity as a stabilizer/gross assist/diminished/nondominant/dominant level with assist, with/without cues during functional activity (OT) Flowsheets (Taken 10/22/2020 1216) LTG: Use of upper extremity in functional activities: RUE as nondominant level LTG: Pt will use upper extremity in functional activity with assistance level of: Independent   Problem: RH Simple Meal Prep Goal: LTG Patient will perform simple meal prep w/assist (OT) Description: LTG: Patient will perform simple meal prep with assistance, with/without cues (OT). Flowsheets (Taken 10/22/2020 1216) LTG: Pt will perform simple meal prep with assistance level of: Independent with assistive device   Problem: RH Light Housekeeping Goal: LTG Patient will perform light housekeeping w/assist (OT) Description: LTG: Patient will perform light housekeeping with assistance, with/without cues (OT). Flowsheets (Taken 10/22/2020 1216) LTG: Pt will perform light housekeeping with assistance level of: Independent with assistive device   Problem: RH Toilet Transfers Goal: LTG Patient will perform toilet transfers  w/assist (OT) Description: LTG: Patient will perform toilet transfers with assist, with/without cues using equipment (OT) Flowsheets (Taken 10/22/2020 1216) LTG: Pt will perform toilet transfers with assistance level  of: Independent with assistive device   Problem: RH Tub/Shower Transfers Goal: LTG Patient will perform tub/shower transfers w/assist (OT) Description: LTG: Patient will perform tub/shower transfers with assist, with/without cues using equipment (OT) Flowsheets (Taken 10/22/2020 1216) LTG: Pt will perform tub/shower stall transfers with assistance level of: Independent with assistive device   Problem: RH Furniture Transfers Goal: LTG Patient will perform furniture transfers w/assist (OT/PT) Description: LTG: Patient will perform furniture transfers  with assistance (OT/PT). Flowsheets (Taken 10/22/2020 1216) LTG: Pt will perform furniture transfers with assist:: Independent with assistive device

## 2020-10-22 NOTE — Plan of Care (Signed)
  Problem: RH Balance Goal: LTG Patient will maintain dynamic standing balance (PT) Description: LTG:  Patient will maintain dynamic standing balance with assistance during mobility activities (PT) Flowsheets (Taken 10/22/2020 1503) LTG: Pt will maintain dynamic standing balance during mobility activities with:: Independent with assistive device    Problem: Sit to Stand Goal: LTG:  Patient will perform sit to stand with assistance level (PT) Description: LTG:  Patient will perform sit to stand with assistance level (PT) Flowsheets (Taken 10/22/2020 1503) LTG: PT will perform sit to stand in preparation for functional mobility with assistance level: Independent with assistive device   Problem: RH Bed Mobility Goal: LTG Patient will perform bed mobility with assist (PT) Description: LTG: Patient will perform bed mobility with assistance, with/without cues (PT). Flowsheets (Taken 10/22/2020 1503) LTG: Pt will perform bed mobility with assistance level of: Independent with assistive device    Problem: RH Bed to Chair Transfers Goal: LTG Patient will perform bed/chair transfers w/assist (PT) Description: LTG: Patient will perform bed to chair transfers with assistance (PT). Flowsheets (Taken 10/22/2020 1503) LTG: Pt will perform Bed to Chair Transfers with assistance level: Independent with assistive device    Problem: RH Car Transfers Goal: LTG Patient will perform car transfers with assist (PT) Description: LTG: Patient will perform car transfers with assistance (PT). Flowsheets (Taken 10/22/2020 1503) LTG: Pt will perform car transfers with assist:: Supervision/Verbal cueing   Problem: RH Furniture Transfers Goal: LTG Patient will perform furniture transfers w/assist (OT/PT) Description: LTG: Patient will perform furniture transfers  with assistance (OT/PT). Flowsheets (Taken 10/22/2020 1503) LTG: Pt will perform furniture transfers with assist:: Independent with assistive device    Problem: RH  Ambulation Goal: LTG Patient will ambulate in controlled environment (PT) Description: LTG: Patient will ambulate in a controlled environment, # of feet with assistance (PT). Flowsheets (Taken 10/22/2020 1503) LTG: Pt will ambulate in controlled environ  assist needed:: Independent with assistive device LTG: Ambulation distance in controlled environment: 237ft with LRAD Goal: LTG Patient will ambulate in home environment (PT) Description: LTG: Patient will ambulate in home environment, # of feet with assistance (PT). Flowsheets (Taken 10/22/2020 1503) LTG: Pt will ambulate in home environ  assist needed:: Independent with assistive device LTG: Ambulation distance in home environment: 76ft with LRAD   Problem: RH Stairs Goal: LTG Patient will ambulate up and down stairs w/assist (PT) Description: LTG: Patient will ambulate up and down # of stairs with assistance (PT) Flowsheets (Taken 10/22/2020 1503) LTG: Pt will ambulate up/down stairs assist needed:: Independent with assistive device LTG: Pt will  ambulate up and down number of stairs: 1 step into house with LRAD

## 2020-10-22 NOTE — Progress Notes (Signed)
Occupational Therapy Session Note  Patient Details  Name: [name] MRN: [id number] Date of Birth: 1960-07-25  Today's Date: 10/22/2020 OT Individual Time: 1025-8527 OT Individual Time Calculation (min): 60 min    Short Term Goals: Week 1:  OT Short Term Goal 1 (Week 1): STG = LTG 2/2 ELOS  Skilled Therapeutic Interventions/Progress Updates:  Pt greeted at time of session sitting up in recliner agreeable to OT session after venting frustrations, upset that therapy had to be rescheduled. After talking for a while got to the root of the issue that the pt does not like change and is anxious about therapy, didn't know what to expect. Explained CIR structure for 3 hours/day, activities vary depending on the day, and role/purpose of OT/PT. Sit <> stands throughout session Supervision and walked room <> gym with CGA/close supervision with RW. Once in gym, performed several dynamic standing activities including: rebounder tosses 1 round standard toss in standing no AD, toss + chest press w/ weighted green ball. Standing on airex for 1-2 minute intervals with lateral weight shifting and improving balance and ankle control as pt feels unsteady. Step ups on stairs with BUE support on railings more for RLE NMR and coordination training noted decreased foot/toe clearance at first and improving throughout, 2x15. Once back in room, walked CGA no AD chair <> bathroom 1 LOB noted with Min/Mod to correct and toileting CGA. Provided cushion for recliner as well for comfort. Alarm on call bell in reach.   Therapy Documentation Precautions:  Precautions Precautions: Fall Precaution Comments: loop recorder Restrictions Weight Bearing Restrictions: No     Therapy/Group: Individual Therapy  Viona Gilmore 10/22/2020, 3:45 PM

## 2020-10-22 NOTE — Progress Notes (Signed)
PROGRESS NOTE   Subjective/Complaints:  Some soreness at loop recorder site IV also aggravting him  Poor sleep, had issues at home and took prn valium   ROS- neg CP, SOB, N/V/D  Objective:   EP PPM/ICD IMPLANT  Result Date: 10/21/2020 SURGEON:  [name], MD   PREPROCEDURE DIAGNOSIS:  Cryptogenic Stroke   POSTPROCEDURE DIAGNOSIS:  Cryptogenic Stroke    PROCEDURES:  1. Implantable loop recorder implantation   INTRODUCTION:  [name] is Frank 60 y.o. male with Frank history of unexplained stroke who presents today for implantable loop implantation.  The patient has had Frank cryptogenic stroke.  Despite an extensive workup by neurology, no reversible causes have been identified.  he has worn telemetry during which he did not have arrhythmias.  There is significant concern for possible atrial fibrillation as the cause for the patients stroke.  The patient therefore presents today for implantable loop implantation.   DESCRIPTION OF PROCEDURE:  Informed written consent was obtained.  The patient required no sedation for the procedure today.  The patients left chest was prepped and draped. Mapping over the patient's chest was performed to identify the appropriate ILR site.  This area was found to be the left  parasternal region over the 3rd-4th intercostal space.  The skin overlying this region was infiltrated with lidocaine for local analgesia.  Frank 0.5-cm incision was made at the implant site.  Frank subcutaneous ILR pocket was fashioned using Frank combination of sharp and blunt dissection.  Frank Medtronic Reveal Linq model [id number] implantable loop recorder was then placed into the pocket R waves were very prominent and measured > 0.2 mV. EBL<1 ml.  Steri- Strips and Frank sterile dressing were then applied.  There were no early apparent complications.   CONCLUSIONS:  1. Successful implantation of Frank Medtronic Reveal LINQ implantable loop recorder for cryptogenic stroke   2. No early apparent complications. [name] MD, [facility] 10/21/2020 1:08 PM   ECHO TEE  Result Date: 10/21/2020    TRANSESOPHOGEAL ECHO REPORT   Patient Name:   [name] Date of Exam: 10/21/2020 Medical Rec #:  [id number]      Height:       68.0 in Accession #:    [id number]     Weight:       250.0 lb Date of Birth:  01-08-1961      BSA:          2.247 m Patient Age:    32 years       BP:           145/81 mmHg Patient Gender: M              HR:           104 bpm. Exam Location:  Inpatient Procedure: 3D Echo, Transesophageal Echo, Cardiac Doppler and Color Doppler Indications:     Stroke  History:         Patient has prior history of Echocardiogram examinations, most                  recent 10/19/2020. Previous Myocardial Infarction, Abnormal ECG,  Signs/Symptoms:Chest Pain; Risk Factors:Hypertension,                  Dyslipidemia and Diabetes. Cancer.  Sonographer:     Roseanna Rainbow RDCS Referring Phys:  Miner Diagnosing Phys: Lyman Bishop MD PROCEDURE: After discussion of the risks and benefits of Frank TEE, an informed consent was obtained from the patient. The transesophogeal probe was passed without difficulty through the esophogus of the patient. Imaged were obtained with the patient in Frank supine position. Sedation performed by different physician. The patient was monitored while under deep sedation. Anesthestetic sedation was provided intravenously by Anesthesiology: 29m of Propofol. The patient's vital signs; including heart rate, blood pressure, and oxygen saturation; remained stable throughout the procedure. The patient developed no complications during the procedure. IMPRESSIONS  1. Left ventricular ejection fraction, by estimation, is 55 to 60%. The left ventricle has normal function. There is mild left ventricular hypertrophy.  2. Right ventricular systolic function is normal. The right ventricular size is normal.  3. Left atrial size was moderately dilated. No left  atrial/left atrial appendage thrombus was detected.  4. The mitral valve is grossly normal. Trivial mitral valve regurgitation.  5. The aortic valve is tricuspid. Aortic valve regurgitation is not visualized. Conclusion(s)/Recommendation(s): No LA/LAA thrombus identified. Negative bubble study for interatrial shunt. No intracardiac source of embolism detected on this on this transesophageal echocardiogram. FINDINGS  Left Ventricle: Left ventricular ejection fraction, by estimation, is 55 to 60%. The left ventricle has normal function. The left ventricular internal cavity size was normal in size. There is mild left ventricular hypertrophy. Right Ventricle: The right ventricular size is normal. No increase in right ventricular wall thickness. Right ventricular systolic function is normal. Left Atrium: Left atrial size was moderately dilated. No left atrial/left atrial appendage thrombus was detected. Right Atrium: Right atrial size was normal in size. Pericardium: There is no evidence of pericardial effusion. Mitral Valve: The mitral valve is grossly normal. Trivial mitral valve regurgitation. Tricuspid Valve: The tricuspid valve is grossly normal. Tricuspid valve regurgitation is trivial. Aortic Valve: The aortic valve is tricuspid. Aortic valve regurgitation is not visualized. Pulmonic Valve: The pulmonic valve was normal in structure. Pulmonic valve regurgitation is not visualized. Aorta: The aortic root and ascending aorta are structurally normal, with no evidence of dilitation. IAS/Shunts: No atrial level shunt detected by color flow Doppler. Agitated saline contrast was given intravenously to evaluate for intracardiac shunting. KLyman BishopMD Electronically signed by KLyman BishopMD Signature Date/Time: 10/21/2020/2:25:10 PM    Final    Recent Labs    10/22/20 0543  WBC 7.2  HGB 12.9*  HCT 37.1*  PLT 253   Recent Labs    10/22/20 0543  NA 139  K 3.4*  CL 111  CO2 23  GLUCOSE 137*  BUN 9   CREATININE 0.86  CALCIUM 8.4*    Intake/Output Summary (Last 24 hours) at 10/22/2020 0823 Last data filed at 10/21/2020 1947 Gross per 24 hour  Intake 440 ml  Output --  Net 440 ml        Physical Exam: Vital Signs Blood pressure 117/60, pulse 79, temperature 98.2 F (36.8 C), resp. rate 18, height _0  (1.727 m), weight 113.4 kg, SpO2 97 %.   General: No acute distress Mood and affect are appropriate Heart: Regular rate and rhythm no rubs murmurs or extra sounds Lungs: Clear to auscultation, breathing unlabored, no rales or wheezes Abdomen: Positive bowel sounds, soft nontender to palpation, nondistended  Extremities: No clubbing, cyanosis, or edema Skin: No evidence of breakdown, no evidence of rash Neurologic: Cranial nerves II through XII intact, motor strength is 5/5 in bilateral deltoid, bicep, tricep, grip, hip flexor, knee extensors, ankle dorsiflexor and plantar flexor Sensory exam normal sensation to light touch and proprioception in bilateral upper and lower extremities Cerebellar exam normal finger to nose to finger as well as heel to shin in bilateral upper and lower extremities Finger to thumb intact BUE  Musculoskeletal: Full range of motion in all 4 extremities. No joint swelling, dactylitis   Assessment/Plan: 1. Functional deficits which require 3+ hours per day of interdisciplinary therapy in Frank comprehensive inpatient rehab setting.  Physiatrist is providing close team supervision and 24 hour management of active medical problems listed below.  Physiatrist and rehab team continue to assess barriers to discharge/monitor patient progress toward functional and medical goals  Care Tool:  Bathing              Bathing assist       Upper Body Dressing/Undressing Upper body dressing   What is the patient wearing?: Pull over shirt    Upper body assist Assist Level: Supervision/Verbal cueing    Lower Body Dressing/Undressing Lower body dressing       What is the patient wearing?: Pants     Lower body assist Assist for lower body dressing: Supervision/Verbal cueing     Toileting Toileting    Toileting assist Assist for toileting: Contact Guard/Touching assist     Transfers Chair/bed transfer  Transfers assist           Locomotion Ambulation   Ambulation assist              Walk 10 feet activity   Assist           Walk 50 feet activity   Assist           Walk 150 feet activity   Assist           Walk 10 feet on uneven surface  activity   Assist           Wheelchair     Assist               Wheelchair 50 feet with 2 turns activity    Assist            Wheelchair 150 feet activity     Assist          Blood pressure 117/60, pulse 79, temperature 98.2 F (36.8 C), resp. rate 18, height _0  (1.727 m), weight 113.4 kg, SpO2 97 %.  Medical Problem List and Plan: 1.  Abnormal gait pattern with gait balance difficulty secondary to left thalamic and posterior CR, bilateral occipital punctate, right cerebellum small infarcts, embolic pattern, cryptogenic origin             -patient may shower             -ELOS/Goals: 7-10 days, Mod I goals with PT, OT Team conference today please see physician documentation under team conference tab, met with team  to discuss problems,progress, and goals. Formulized individual treatment plan based on medical history, underlying problem and comorbidities.  2.  Antithrombotics: -DVT/anticoagulation: SCDs             -antiplatelet therapy: Aspirin 81 mg daily and Brilinta 90 mg twice daily 3. Pain Management: Tylenol as needed 4. Mood: Valium 5 mg daily as needed anxiety             -  antipsychotic agents: N/Frank 5. Neuropsych: This patient is capable of making decisions on his own behalf. 6. Skin/Wound Care: Routine skin checks 7. Fluids/Electrolytes/Nutrition: Routine in and outs with follow-up chemistries             -pt  requested dietary consult for healthier eating habits 8.  Hypertension.  Cozaar 12.5 mg daily.  Monitor with increased mobility Vitals:   10/21/20 1932 10/22/20 0428  BP: (!) 142/79 117/60  Pulse: 91 79  Resp: 18 18  Temp: 98 F (36.7 C) 98.2 F (36.8 C)  SpO2: 99% 97%   9.  Diabetes mellitus with peripheral neuropathy.  Hemoglobin A1c 8.7.  Lantus insulin 40 units daily.       CBG (last 3)  Recent Labs    10/21/20 2024 10/22/20 0007 10/22/20 0425  GLUCAP 239* 148* 146*  goal is 100-180  10.  Hyperlipidemia.  Pravachol 11.  History of CAD with STEMI, continue aspirin 12.  Obesity.  BMI 36.32.  Dietary follow-up 13.  Chronic systolic congestive heart failure.  Monitor for signs of fluid overload.             -check daily weights.  14.  Tobacco abuse.  Counseling 15.  History of colon cancer.  Follow-up outpatient follow-up Dr. [name]    LOS: 1 days [name] 10/22/2020, 8:23 AM

## 2020-10-22 NOTE — Progress Notes (Signed)
Pt rested "ok". Pt c/o difficulty sleeping, requesting sleep medication. Pt also requested no CBG checks through out the night. Pt denies pain, and no signs of distress. Pt resting in bed, call light in reach

## 2020-10-22 NOTE — Evaluation (Signed)
Physical Therapy Assessment and Plan  Patient Details  Name: [name] MRN: [id number] Date of Birth: 03-Oct-1960  PT Diagnosis: Abnormal posture, Abnormality of gait, Coordination disorder and Hemiplegia non-dominant Rehab Potential: Excellent ELOS: 6-8 days   Today's Date: 10/22/2020 PT Individual Time: 0850-1001 PT Individual Time Calculation (min): 71 min    Hospital Problem: Principal Problem:   Left thalamic infarction [facility])   Past Medical History:  Past Medical History:  Diagnosis Date  . Anemia 10/03/2009   Qualifier: Diagnosis of  By: [name]   . Anxiety associated with depression 10/11/2013  . Colon cancer ([place])    right colon cancer- adenocarcinoma CEA level isnrmal at 1.8  . Diabetes mellitus   . Diabetes mellitus type 2 in obese [facility]) 03/20/2010   Qualifier: Diagnosis of  By: [name]    . Erectile dysfunction 06/22/2016  . Essential hypertension 06/05/2014  . FATTY LIVER DISEASE 11/04/2009   Qualifier: Diagnosis of  By: [name] MD, Docia Chuck   Qualifier: Diagnosis of  By: [name] MD, Docia Chuck  Last Assessment & Plan:  conirmed by CT scan of abdomen in April 2016 encouraged to minimize simple carbs and fatty foods.  Marland Kitchen GERD 11/04/2009   Qualifier: Diagnosis of  By: [name] MD, Docia Chuck   . Great toe pain, right 04/18/2017  . Hepatic artery stenosis ([place])   . History of colon cancer 01/02/2010   Qualifier: Diagnosis of  By: [name] Dr [name]  Last Assessment & Plan:  Follows closely with gastroenterology, Dr [name]  . Hyperlipidemia   . Hyperlipidemia, mixed 09/07/2010   Qualifier: Diagnosis of  By: [name]   . Hypertension   . Hypertriglyceridemia 12/14/2010  . Hypotestosteronism 06/22/2016  . INSOMNIA, CHRONIC 03/20/2010   Qualifier: Diagnosis of  By: [name] Ambien 10 mg daily does not keep asleep AdvilPM is over sedating and has trouble waking up   . Iron deficiency anemia 2011  . Low testosterone 06/22/2016  . Malignant neoplasm of  colon ([place]) 01/02/2010   Qualifier: Diagnosis of  By: [name] Dr [name]   . Muscle spasm of back 06/05/2014  . Nipple pain 09/30/2016  . Obesity   . Palpitations 06/05/2014  . Preventative health care 10/08/2012  . Psoriasis   . Psoriatic arthritis ([place]) 08/25/2017  . Right knee pain 11/17/2011   Past Surgical History:  Past Surgical History:  Procedure Laterality Date  . CARPAL TUNNEL RELEASE     RIGHT  . CHOLECYSTECTOMY  1994  . COLONOSCOPY    . CORONARY/GRAFT ACUTE MI REVASCULARIZATION N/A 10/27/2019   Procedure: Coronary/Graft Acute MI Revascularization;  Surgeon: [name], MD;  Location: [place] [facility];  Service: Cardiovascular;  Laterality: N/A;  . HEMICOLECTOMY     12/17/2009 right  . LEFT HEART CATH AND CORONARY ANGIOGRAPHY N/A 10/27/2019   Procedure: LEFT HEART CATH AND CORONARY ANGIOGRAPHY;  Surgeon: [name], MD;  Location: [place] CV LAB;  Service: Cardiovascular;  Laterality: N/A;  . LOOP RECORDER INSERTION N/A 10/21/2020   Procedure: LOOP RECORDER INSERTION;  Surgeon: [name], MD;  Location: [place] CV LAB;  Service: Cardiovascular;  Laterality: N/A;  . POLYPECTOMY      Assessment & Plan Clinical Impression: Patient is a 60 year old right-handed male with history of CAD/STEMI maintained on aspirin, tobacco abuse, obesity with BMI 36.32, diabetes mellitus, chronic systolic congestive heart failure, anemia and colon cancer. Per chart review  lives alone. Independent prior to admission. Presented 10/18/2020 with balance difficulties x2 days and noted fall without loss of consciousness. He denied any weakness or double vision. Patient initial MRI at [facility] demonstrated multifocal embolic appearing strokes in the posterior circulation including the right cerebellum and left thalamus, periventricular white matter. Patient did not receive TPA. CT angiogram of head and neck negative CTA for large vessel occlusion. 50%  atheromatous stenosis about the left carotid bulb/proximal left ICA. Bilateral lower extremity Dopplers negative. Echocardiogram with ejection fraction of 55 to 01% grade 2 diastolic dysfunction. Admission chemistries unremarkable except glucose 184, urinalysis negative, hemoglobin A1c 8.7, urine drug screen positive benzos, troponin negative. Presently maintained on Brilinta for CVA prophylaxis as well as low-dose aspirin. Patient was scheduled for TEEshowed no cardiac source of embolus without thrombus negative for PFOand loop recorder was placed4/11/2020. Patient transferred to CIR on 10/21/2020 .   Patient currently requires min with mobility secondary to muscle weakness and muscle joint tightness, decreased cardiorespiratoy endurance, decreased coordination and decreased standing balance, decreased postural control, hemiplegia and decreased balance strategies.  Prior to hospitalization, patient was independent  with mobility and lived with Alone in a House home.  Home access is 1Stairs to enter.  Patient will benefit from skilled PT intervention to maximize safe functional mobility, minimize fall risk and decrease caregiver burden for planned discharge home with intermittent assist.  Anticipate patient will benefit from follow up [facility] at discharge.  PT - End of Session Activity Tolerance: Tolerates 30+ min activity with multiple rests Endurance Deficit: Yes PT Assessment Rehab Potential (ACUTE/IP ONLY): Excellent PT Barriers to Discharge: Lack of/limited family support;Decreased caregiver support;Home environment access/layout;Medication compliance PT Patient demonstrates impairments in the following area(s): Balance;Edema;Endurance;Motor;Safety;Sensory PT Transfers Functional Problem(s): Bed Mobility;Bed to Chair;Car;Furniture;Floor PT Locomotion Functional Problem(s): Ambulation;Wheelchair Mobility;Stairs PT Plan PT Intensity: Minimum of 1-2 x/day ,45 to 90 minutes PT Frequency: 5 out of 7  days PT Duration Estimated Length of Stay: 6-8 days PT Treatment/Interventions: Ambulation/gait training;Balance/vestibular training;Discharge planning;Community reintegration;Cognitive remediation/compensation;Disease management/prevention;DME/adaptive equipment instruction;Functional electrical stimulation;Functional mobility training;Neuromuscular re-education;Pain management;Patient/family education;Psychosocial support;Skin care/wound management;Splinting/orthotics;Stair training;Therapeutic Activities;Therapeutic Exercise;UE/LE Strength taining/ROM;UE/LE Coordination activities;Wheelchair propulsion/positioning;Visual/perceptual remediation/compensation PT Transfers Anticipated Outcome(s): Mod I with LRAD PT Locomotion Anticipated Outcome(s): Modified Indep, ambulatory, with LRAD PT Recommendation Recommendations for Other Services: Therapeutic Recreation consult Therapeutic Recreation Interventions: Outing/community reintergration;Stress management Follow Up Recommendations: Home health PT Patient destination: Home Equipment Recommended: To be determined   PT Evaluation Precautions/Restrictions   General   Vital Signs  Pain   Home Living/Prior Functioning Home Living Available Help at Discharge: Friend(s);Available PRN/intermittently Type of Home: House Home Access: Stairs to enter [employer] of Steps: 1 Entrance Stairs-Rails: None Home Layout: One level Bathroom Shower/Tub: Art gallery manager: Yes  Lives With: Alone Prior Function Level of Independence: Independent with basic ADLs  Able to Take Stairs?: Yes Driving: Yes Vocation: Full time employment Vocation Requirements: Works in Probation officer Comments: I with ADLs/IADLs; driving; cooking/cleaning; working Vision/Perception  Vision - Leisure centre manager Range of Motion: Within Functional Limits Alignment/Gaze Preference: Within Defined Limits Tracking/Visual Pursuits: Able to track  stimulus in all quads without difficulty Perception Perception: Within Functional Limits Praxis Praxis: Intact  Cognition Orientation Level: Oriented X4 Sensation Sensation Light Touch: Appears Intact Proprioception: Appears Intact Coordination Gross Motor Movements are Fluid and Coordinated: No Fine Motor Movements are Fluid and Coordinated: Yes Coordination and Movement Description: mild dysmetria RUE and RLE Finger Nose Finger Test: mild decreased speed on the R side, Heel Shin  Test: mild decreased speed on the R vs L. Motor  Motor Motor: Hemiplegia Motor - Skilled Clinical Observations: mild R sided hemiplegia   Trunk/Postural Assessment  Cervical Assessment Cervical Assessment: Exceptions to [facility] (forward head) Thoracic Assessment Thoracic Assessment: Exceptions to [facility] (rounded shoulders. limited rotation) Lumbar Assessment Lumbar Assessment: Exceptions to [facility] (decreaed lordosis) Postural Control Postural Control: Deficits on evaluation (mild delayed reactions on the R in standing. mild increased tone on the R with exersion)  Balance Balance Balance Assessed: Yes Standardized Balance Assessment Standardized Balance Assessment: Berg Balance Test Berg Balance Test Sit to Stand: Able to stand  independently using hands Standing Unsupported: Able to stand safely 2 minutes Sitting with Back Unsupported but Feet Supported on Floor or Stool: Able to sit safely and securely 2 minutes Stand to Sit: Controls descent by using hands Transfers: Able to transfer safely, definite need of hands Standing Unsupported with Eyes Closed: Able to stand 10 seconds with supervision Standing Ubsupported with Feet Together: Able to place feet together independently and stand for 1 minute with supervision From Standing, Reach Forward with Outstretched Arm: Can reach forward >12 cm safely (5") From Standing Position, Pick up Object from Floor: Able to pick up shoe, needs supervision From  Standing Position, Turn to Look Behind Over each Shoulder: Turn sideways only but maintains balance Turn 360 Degrees: Able to turn 360 degrees safely but slowly Standing Unsupported, Alternately Place Feet on Step/Stool: Able to complete >2 steps/needs minimal assist Standing Unsupported, One Foot in Front: Able to plae foot ahead of the other independently and hold 30 seconds Standing on One Leg: Tries to lift leg/unable to hold 3 seconds but remains standing independently Total Score: 38 Dynamic Gait Index Level Surface: Mild Impairment Change in Gait Speed: Mild Impairment Gait with Horizontal Head Turns: Mild Impairment Gait with Vertical Head Turns: Moderate Impairment Gait and Pivot Turn: Moderate Impairment Step Over Obstacle: Mild Impairment Step Around Obstacles: Mild Impairment Steps: Moderate Impairment Total Score: 13 Static Sitting Balance Static Sitting - Balance Support: No upper extremity supported Static Sitting - Level of Assistance: 7: Independent Dynamic Sitting Balance Dynamic Sitting - Balance Support: No upper extremity supported Dynamic Sitting - Level of Assistance: 5: Stand by assistance Static Standing Balance Static Standing - Balance Support: No upper extremity supported Static Standing - Level of Assistance: 5: Stand by assistance Dynamic Standing Balance Dynamic Standing - Balance Support: No upper extremity supported;During functional activity Dynamic Standing - Level of Assistance: 4: Min assist;5: Stand by assistance Extremity Assessment      RLE Assessment RLE Assessment: Within Functional Limits General Strength Comments: grossly decreased coordination and activaiton LLE Assessment LLE Assessment: Within Functional Limits  Care Tool Care Tool Bed Mobility Roll left and right activity   Roll left and right assist level: Supervision/Verbal cueing    Sit to lying activity   Sit to lying assist level: Supervision/Verbal cueing    Lying to  sitting edge of bed activity   Lying to sitting edge of bed assist level: Supervision/Verbal cueing     Care Tool Transfers Sit to stand transfer   Sit to stand assist level: Minimal Assistance - Patient > 75%    Chair/bed transfer   Chair/bed transfer assist level: Minimal Assistance - Patient > 75%     Physiological scientist transfer assist level: Minimal Assistance - Patient > 75%      Care Tool Locomotion Ambulation   Assist  level: Minimal Assistance - Patient > 75% Assistive device: No Device Max distance: 150  Walk 10 feet activity   Assist level: Minimal Assistance - Patient > 75% Assistive device: No Device   Walk 50 feet with 2 turns activity   Assist level: Minimal Assistance - Patient > 75% Assistive device: No Device  Walk 150 feet activity   Assist level: Minimal Assistance - Patient > 75% Assistive device: No Device  Walk 10 feet on uneven surfaces activity   Assist level: Minimal Assistance - Patient > 75%    Stairs   Assist level: Minimal Assistance - Patient > 75% Stairs assistive device: 2 hand rails Max number of stairs: 12  Walk up/down 1 step activity   Walk up/down 1 step (curb) assist level: Minimal Assistance - Patient > 75% Walk up/down 1 step or curb assistive device: 2 hand rails    Walk up/down 4 steps activity Walk up/down 4 steps assist level: Minimal Assistance - Patient > 75% Walk up/down 4 steps assistive device: 2 hand rails  Walk up/down 12 steps activity   Walk up/down 12 steps assist level: Minimal Assistance - Patient > 75% Walk up/down 12 steps assistive device: 2 hand rails  Pick up small objects from floor   Pick up small object from the floor assist level: Minimal Assistance - Patient > 75%    Wheelchair Will patient use wheelchair at discharge?: No   Wheelchair activity did not occur: N/A      Wheel 50 feet with 2 turns activity Wheelchair 50 feet with 2 turns activity did not occur: N/A    Wheel  150 feet activity Wheelchair 150 feet activity did not occur: N/A      Refer to Care Plan for Long Term Goals  SHORT TERM GOAL WEEK 1 PT Short Term Goal 1 (Week 1): STG=:LTG due to ELOS  Recommendations for other services: Therapeutic Recreation  Kitchen group and Stress management  Skilled Therapeutic Intervention  Pt received supine in bed and agreeable to PT. Supine>sit transfer with supervisoin assist for safety. PT instructed patient in PT Evaluation and initiated treatment intervention; see above for results. PT educated patient in [place], rehab potential, rehab goals, and discharge recommendations along with recommendation for follow-up rehabilitation services.  Gait training with and without AD with supervision assist with AD And min-CGA.  Patient demonstrates increased fall risk as noted by score of   38/56 on Berg Balance Scale.  (<36= high risk for falls, close to 100%; 37-45 significant >80%; 46-51 moderate >50%; 52-55 lower >25%)  PT instructed pt in DGI. See below for results. Demonstrates increased fall risk with score of 13/24. (<19 indicates increased fall risk)  Car transfer and gait training up and down ramp with min assist and no UE. Cues for safety and sequencing. Pt returned to room and performed ambulatory transfer to bed with supervision assist and RW. Sit>supine completed with supervision assist for safety, and left supine in bed with call bell in reach and all needs met.     Mobility Bed Mobility Bed Mobility: Rolling Left;Supine to Sit Rolling Left: Supervision/Verbal cueing Supine to Sit: Supervision/Verbal cueing Transfers Transfers: Sit to Stand;Stand to Sit;Stand Pivot Transfers Sit to Stand: Contact Guard/Touching assist Stand to Sit: Contact Guard/Touching assist Stand Pivot Transfers: Contact Guard/Touching assist Transfer (Assistive device): None Locomotion  Gait Ambulation: Yes Gait Assistance: Supervision/Verbal cueing Gait Distance (Feet): 200  Feet Assistive device: Rolling walker Gait Gait: Yes Gait Pattern: Ataxic;Decreased stance time - right Stairs /  Additional Locomotion Stairs: Yes Stairs Assistance: Minimal Assistance - Patient > 75% Stair Management Technique: Two rails Number of Stairs: 12 Height of Stairs: 6 Wheelchair Mobility Wheelchair Mobility: No   Discharge Criteria: Patient will be discharged from PT if patient refuses treatment 3 consecutive times without medical reason, if treatment goals not met, if there is a change in medical status, if patient makes no progress towards goals or if patient is discharged from hospital.  The above assessment, treatment plan, treatment alternatives and goals were discussed and mutually agreed upon: by patient and by family  Lorie Phenix 10/22/2020, 10:16 AM

## 2020-10-22 NOTE — Patient Care Conference (Signed)
Inpatient RehabilitationTeam Conference and Plan of Care Update Date: 10/22/2020   Time: 10:11 AM    Patient Name: [name]      Medical Record Number: [id number]  Date of Birth: 05/07/1961 Sex: Male         Room/Bed: 4M12C/4M12C-01 Payor Info: Payor: Theme park manager / Plan: UMR/UHC PPO / Product Type: *No Product type* /    Admit Date/Time:  10/21/2020  5:19 PM  Primary Diagnosis:  Left thalamic infarction [facility])  Hospital Problems: Principal Problem:   Left thalamic infarction [facility])    Expected Discharge Date: Expected Discharge Date:  (inital evals pending; ELOS 7 days)  Team Members Present: Physician leading conference: Dr. Alysia Penna Care Coodinator Present: Dorien Chihuahua, RN, BSN, CRRN;Christina Shedd, [place] Nurse Present: Dorien Chihuahua, RN PT Present: Barrie Folk, PT OT Present: Other (comment) Providence Lanius, OT) [place] Coordinator present : Gunnar Fusi, SLP     Current Status/Progress Goal Weekly Team Focus  Bowel/Bladder   pt is continent x2. LBM 10/21/20  Pt will remain continent  q2h toileting   Swallow/Nutrition/ Hydration             ADL's   eval pending  eval pending  eval pending   Mobility             Communication             Safety/Cognition/ Behavioral Observations            Pain   no complaints of pain at this time  Pt will be free of pain.  Assess pain qshift/prn   Skin   no obvious signs of breakdown or infection. Loop recorder placed4/5; tegaderm in palce.  Pt will remain free of infection and breakdown  assess skin qshift     Discharge Planning:  evals pending   Team Discussion: Left thalamic infarct with sensory deficits to right side Mild dysmetria but cognitively intact. MD monitoring BP. Change CBGs to AC + HS. Patient having difficulty sleeping.  Patient on target to meet rehab goals: yes, mild tone right side. Mod I goals set for discharge.  *See Care Plan and progress notes for long and short-term goals.    Revisions to Treatment Plan:   Teaching Needs: Transfers, medications, secondary stroke risks management, etc.   Current Barriers to Discharge: Lack of/limited family support  Possible Resolutions to Barriers: Practice on home-like settings     Medical Summary Current Status: BP labile poor sleep, lives alone  Barriers to Discharge: Medical stability   Possible Resolutions to [employer] Focus: initial evals, needs to be at Mod I level   Continued Need for Acute Rehabilitation Level of Care: The patient requires daily medical management by a physician with specialized training in physical medicine and rehabilitation for the following reasons: Direction of a multidisciplinary physical rehabilitation program to maximize functional independence : Yes Medical management of patient stability for increased activity during participation in an intensive rehabilitation regime.: Yes Analysis of laboratory values and/or radiology reports with any subsequent need for medication adjustment and/or medical intervention. : Yes   I attest that I was present, lead the team conference, and concur with the assessment and plan of the team.   Dorien Chihuahua B 10/22/2020, 5:31 PM

## 2020-10-22 NOTE — Progress Notes (Signed)
[facility]patient [facility] Individual Statement of Services  Patient Name:  [name]  Date:  10/22/2020  Welcome to the [facility].  Our goal is to provide you with an individualized program based on your diagnosis and situation, designed to meet your specific needs.  With this comprehensive rehabilitation program, you will be expected to participate in at least 3 hours of rehabilitation therapies Monday-Friday, with modified therapy programming on the weekends.  Your rehabilitation program will include the following services:  Physical Therapy (PT), Occupational Therapy (OT), Speech Therapy (ST), 24 hour per day rehabilitation nursing, Therapeutic Recreaction (TR), Neuropsychology, Care Coordinator, Rehabilitation Medicine, Nutrition Services, Pharmacy Services and Other  Weekly team conferences will be held on Wedndesdays to discuss your progress.  Your Inpatient Rehabilitation Care Coordinator will talk with you frequently to get your input and to update you on team discussions.  Team conferences with you and your family in attendance may also be held.  Expected length of stay: 7 Days  Overall anticipated outcome: MOD I  Depending on your progress and recovery, your program may change. Your Inpatient Rehabilitation Care Coordinator will coordinate services and will keep you informed of any changes. Your Inpatient Rehabilitation Care Coordinator's name and contact numbers are listed  below.  The following services may also be recommended but are not provided by the [facility]:    [place] will be made to provide these services after discharge if needed.  Arrangements include referral to agencies that provide these services.  Your insurance has been verified to be:  Medicaid Family Planning Your primary doctor is:  Penni Homans, MD  Pertinent information  will be shared with your doctor and your insurance company.  Inpatient Rehabilitation Care Coordinator:  Erlene Quan, [place] or [phone]  Information discussed with and copy given to patient by: Dyanne Iha, 10/22/2020, 11:32 AM

## 2020-10-22 NOTE — Progress Notes (Signed)
Inpatient Rehabilitation  Patient information reviewed and entered into eRehab system by Amelita Risinger M. Brenton Joines, M.A., CCC/SLP, PPS Coordinator.  Information including medical coding, functional ability and quality indicators will be reviewed and updated through discharge.    

## 2020-10-22 NOTE — Progress Notes (Addendum)
Inpatient Rehabilitation Care Coordinator Assessment and Plan Patient Details  Name: [name] MRN: [id number] Date of Birth: 1960-08-18  Today's Date: 10/22/2020  Hospital Problems: Principal Problem:   Left thalamic infarction [facility])  Past Medical History:  Past Medical History:  Diagnosis Date  . Anemia 10/03/2009   Qualifier: Diagnosis of  By: [name]   . Anxiety associated with depression 10/11/2013  . Colon cancer ([place])    right colon cancer- adenocarcinoma CEA level isnrmal at 1.8  . Diabetes mellitus   . Diabetes mellitus type 2 in obese [facility]) 03/20/2010   Qualifier: Diagnosis of  By: [name]    . Erectile dysfunction 06/22/2016  . Essential hypertension 06/05/2014  . FATTY LIVER DISEASE 11/04/2009   Qualifier: Diagnosis of  By: [name] MD, Docia Chuck   Qualifier: Diagnosis of  By: [name] MD, Docia Chuck  Last Assessment & Plan:  conirmed by CT scan of abdomen in April 2016 encouraged to minimize simple carbs and fatty foods.  Marland Kitchen GERD 11/04/2009   Qualifier: Diagnosis of  By: [name] MD, Docia Chuck   . Great toe pain, right 04/18/2017  . Hepatic artery stenosis ([place])   . History of colon cancer 01/02/2010   Qualifier: Diagnosis of  By: [name] Dr [name]  Last Assessment & Plan:  Follows closely with gastroenterology, Dr [name]  . Hyperlipidemia   . Hyperlipidemia, mixed 09/07/2010   Qualifier: Diagnosis of  By: [name]   . Hypertension   . Hypertriglyceridemia 12/14/2010  . Hypotestosteronism 06/22/2016  . INSOMNIA, CHRONIC 03/20/2010   Qualifier: Diagnosis of  By: [name] Ambien 10 mg daily does not keep asleep AdvilPM is over sedating and has trouble waking up   . Iron deficiency anemia 2011  . Low testosterone 06/22/2016  . Malignant neoplasm of colon ([place]) 01/02/2010   Qualifier: Diagnosis of  By: [name] Dr [name]   . Muscle spasm of back 06/05/2014  . Nipple pain 09/30/2016  . Obesity   . Palpitations 06/05/2014  . Preventative  health care 10/08/2012  . Psoriasis   . Psoriatic arthritis ([place]) 08/25/2017  . Right knee pain 11/17/2011   Past Surgical History:  Past Surgical History:  Procedure Laterality Date  . BUBBLE STUDY  10/21/2020   Procedure: BUBBLE STUDY;  Surgeon: [name], MD;  Location: [facility] ENDOSCOPY;  Service: Cardiovascular;;  . CARPAL TUNNEL RELEASE     RIGHT  . CHOLECYSTECTOMY  1994  . COLONOSCOPY    . CORONARY/GRAFT ACUTE MI REVASCULARIZATION N/A 10/27/2019   Procedure: Coronary/Graft Acute MI Revascularization;  Surgeon: [name], MD;  Location: [place] CV LAB;  Service: Cardiovascular;  Laterality: N/A;  . HEMICOLECTOMY     12/17/2009 right  . LEFT HEART CATH AND CORONARY ANGIOGRAPHY N/A 10/27/2019   Procedure: LEFT HEART CATH AND CORONARY ANGIOGRAPHY;  Surgeon: [name], MD;  Location: [place] CV LAB;  Service: Cardiovascular;  Laterality: N/A;  . LOOP RECORDER INSERTION N/A 10/21/2020   Procedure: LOOP RECORDER INSERTION;  Surgeon: [name], MD;  Location: [place] CV LAB;  Service: Cardiovascular;  Laterality: N/A;  . POLYPECTOMY    . TEE WITHOUT CARDIOVERSION N/A 10/21/2020   Procedure: TRANSESOPHAGEAL ECHOCARDIOGRAM (TEE);  Surgeon: [name], MD;  Location: [facility] ENDOSCOPY;  Service: Cardiovascular;  Laterality: N/A;   Social History:  reports that he quit smoking about a year ago. His smoking use included cigarettes. He has  never used smokeless tobacco. He reports that he does not drink alcohol and does not use drugs.  Family / Support Systems Marital Status: Single Anticipated Caregiver: [name] Ability/Limitations of Caregiver: Works during day Careers adviser: Evenings only  Social History Preferred language: English Religion: Catholic Read: Yes Write: Yes Employment Status: Employed Public relations account executive Issues: n/a Guardian/Conservator: n/a   Abuse/Neglect Abuse/Neglect Assessment Can Be Completed: Yes Physical Abuse:  Denies Verbal Abuse: Denies Sexual Abuse: Denies Exploitation of patient/patient's resources: Denies Self-Neglect: Denies  Emotional Status Pt's affect, behavior and adjustment status: coping Recent Psychosocial Issues: anxiety and depression Psychiatric History: n/a Substance Abuse History: positive drug screen  Patient / Family Perceptions, Expectations & Goals Pt/Family understanding of illness & functional limitations: n/a Premorbid pt/family roles/activities: Pt previously independent (working and driving) Anticipated changes in roles/activities/participation: Pt will be independent at home Pt/family expectations/goals: Tool: None Premorbid Home Care/DME Agencies: Other (Comment) Lobbyist, Conservation officer, nature, Wellsite geologist) Transportation available at discharge: family able to [employer] referrals recommended: Neuropsychology (axiety associated with depression,chronic insomia, substance)  Discharge Planning Living Arrangements: Alone Support Systems: Friends/neighbors Type of Residence: Private residence (1 level home, 1 step to enter) Administrator, sports: Multimedia programmer (specify) Heritage manager (Medicaid Family Planning?)) Financial Resources: Employment Living Expenses: Education officer, community Management: Patient Does the patient have any problems obtaining your medications?: No Home Management: Independent Patient/Family Preliminary Plans: Pt wil d//c independent Care Coordinator Barriers to Discharge: Lack of/limited family support,Decreased caregiver support Care Coordinator Anticipated Follow Up Needs: HH/OP Expected length of stay: 7 Days  Clinical Impression sw met with pt introduced self, explained role and process. Pt d/c home independent. PT requesting IV to be removed in arm.  No additional questions or concerns.  Dyanne Iha 10/22/2020, 1:22 PM

## 2020-10-22 NOTE — Evaluation (Signed)
Occupational Therapy Assessment and Plan  Patient Details  Name: Danile Trier MRN: [id number] Date of Birth: Dec 06, 1960  OT Diagnosis: abnormal posture, hemiplegia affecting non-dominant side, muscle weakness (generalized) and decreased dynamic standing balance, activity tolerance, mild R FMC deficits Rehab Potential: Rehab Potential (ACUTE ONLY): Good ELOS: 7 to 9 days   Today's Date: 10/22/2020 OT Individual Time: 3710-6269 OT Individual Time Calculation (min): 63 min     Hospital Problem: Principal Problem:   Left thalamic infarction [facility])   Past Medical History:  Past Medical History:  Diagnosis Date  . Anemia 10/03/2009   Qualifier: Diagnosis of  By: [name]   . Anxiety associated with depression 10/11/2013  . Colon cancer ([place])    right colon cancer- adenocarcinoma CEA level isnrmal at 1.8  . Diabetes mellitus   . Diabetes mellitus type 2 in obese [facility]) 03/20/2010   Qualifier: Diagnosis of  By: [name]    . Erectile dysfunction 06/22/2016  . Essential hypertension 06/05/2014  . FATTY LIVER DISEASE 11/04/2009   Qualifier: Diagnosis of  By: [name] MD, Docia Chuck   Qualifier: Diagnosis of  By: [name] MD, Docia Chuck  Last Assessment & Plan:  conirmed by CT scan of abdomen in April 2016 encouraged to minimize simple carbs and fatty foods.  Marland Kitchen GERD 11/04/2009   Qualifier: Diagnosis of  By: [name] MD, Docia Chuck   . Great toe pain, right 04/18/2017  . Hepatic artery stenosis ([place])   . History of colon cancer 01/02/2010   Qualifier: Diagnosis of  By: [name] Dr [name]  Last Assessment & Plan:  Follows closely with gastroenterology, Dr [name]  . Hyperlipidemia   . Hyperlipidemia, mixed 09/07/2010   Qualifier: Diagnosis of  By: [name]   . Hypertension   . Hypertriglyceridemia 12/14/2010  . Hypotestosteronism 06/22/2016  . INSOMNIA, CHRONIC 03/20/2010   Qualifier: Diagnosis of  By: [name] Ambien 10 mg daily does not keep asleep AdvilPM is over sedating and  has trouble waking up   . Iron deficiency anemia 2011  . Low testosterone 06/22/2016  . Malignant neoplasm of colon ([place]) 01/02/2010   Qualifier: Diagnosis of  By: [name] Dr [name]   . Muscle spasm of back 06/05/2014  . Nipple pain 09/30/2016  . Obesity   . Palpitations 06/05/2014  . Preventative health care 10/08/2012  . Psoriasis   . Psoriatic arthritis ([place]) 08/25/2017  . Right knee pain 11/17/2011   Past Surgical History:  Past Surgical History:  Procedure Laterality Date  . CARPAL TUNNEL RELEASE     RIGHT  . CHOLECYSTECTOMY  1994  . COLONOSCOPY    . CORONARY/GRAFT ACUTE MI REVASCULARIZATION N/A 10/27/2019   Procedure: Coronary/Graft Acute MI Revascularization;  Surgeon: [name], MD;  Location: [place] [facility];  Service: Cardiovascular;  Laterality: N/A;  . HEMICOLECTOMY     12/17/2009 right  . LEFT HEART CATH AND CORONARY ANGIOGRAPHY N/A 10/27/2019   Procedure: LEFT HEART CATH AND CORONARY ANGIOGRAPHY;  Surgeon: [name], MD;  Location: [place] [facility];  Service: Cardiovascular;  Laterality: N/A;  . LOOP RECORDER INSERTION N/A 10/21/2020   Procedure: LOOP RECORDER INSERTION;  Surgeon: [name], MD;  Location: [place] [facility];  Service: Cardiovascular;  Laterality: N/A;  . POLYPECTOMY      Assessment & Plan Clinical Impression: Patient is a 60 y.o. year old male with history of CAD/STEMI maintained on aspirin, tobacco  abuse, obesity with BMI 36.32, diabetes mellitus, chronic systolic congestive heart failure, anemia and colon cancer. Per chart review lives alone. Independent prior to admission. Presented 10/18/2020 with balance difficulties x2 days and noted fall without loss of consciousness. He denied any weakness or double vision. Patient initial MRI at [facility] demonstrated multifocal embolic appearing strokes in the posterior circulation including the right cerebellum and left thalamus, periventricular white matter.  Patient did not receive TPA. CT angiogram of head and neck negative CTA for large vessel occlusion. 50% atheromatous stenosis about the left carotid bulb/proximal left ICA. Bilateral lower extremity Dopplers negative. Echocardiogram with ejection fraction of 55 to 30% grade 2 diastolic dysfunction. Admission chemistries unremarkable except glucose 184, urinalysis negative, hemoglobin A1c 8.7, urine drug screen positive benzos, troponin negative. Presently maintained on Brilinta for CVA prophylaxis as well as low-dose aspirin. Patient was scheduled for TEEshowed no cardiac source of embolus without thrombus negative for PFOand loop recorder was placed4/11/2020. Therapy evaluations completed due to patient decreased functional mobility was admitted for a comprehensive rehab program.  Patient transferred to CIR on 10/21/2020 .    Patient currently requires CGA with basic self-care skills and IADL secondary to muscle weakness, decreased cardiorespiratoy endurance, decreased coordination and decreased standing balance.  Prior to hospitalization, patient could complete BADL/IADL with independent .  Patient will benefit from skilled intervention to decrease level of assist with basic self-care skills and increase level of independence with iADL prior to discharge home independently.  Anticipate patient will require no S and follow up outpatient.  OT - End of Session Activity Tolerance: Tolerates 10 - 20 min activity with multiple rests Endurance Deficit: Yes Endurance Deficit Description: req increased time and seated rest break in between ADL OT Assessment Rehab Potential (ACUTE ONLY): Good OT Patient demonstrates impairments in the following area(s): Balance;Endurance;Motor OT Basic ADL's Functional Problem(s): Grooming;Bathing;Dressing;Toileting OT Advanced ADL's Functional Problem(s): Simple Meal Preparation OT Transfers Functional Problem(s): Toilet;Tub/Shower OT Additional Impairment(s):  Fuctional Use of Upper Extremity OT Plan OT Intensity: Minimum of 1-2 x/day, 45 to 90 minutes OT Frequency: 5 out of 7 days OT Duration/Estimated Length of Stay: 7 to 9 days OT Treatment/Interventions: Balance/vestibular training;Disease mangement/prevention;Neuromuscular re-education;Self Care/advanced ADL retraining;Therapeutic Exercise;UE/LE Strength taining/ROM;Skin care/wound managment;Pain management;DME/adaptive equipment instruction;Community reintegration;Patient/family education;UE/LE Coordination activities;Therapeutic Activities;Psychosocial support;Functional mobility training;Discharge planning OT Self Feeding Anticipated Outcome(s): ind OT Basic Self-Care Anticipated Outcome(s): mod I OT Toileting Anticipated Outcome(s): mod I OT Bathroom Transfers Anticipated Outcome(s): mod I OT Recommendation Recommendations for Other Services: Therapeutic Recreation consult;Neuropsych consult Therapeutic Recreation Interventions: Pet therapy;Kitchen group;Stress management;Outing/community reintergration Patient destination: Home Follow Up Recommendations: Outpatient OT Equipment Recommended: To be determined   OT Evaluation Precautions/Restrictions  Precautions Precautions: Fall Precaution Comments: loop recorder Restrictions Weight Bearing Restrictions: No General Chart Reviewed: Yes Family/Caregiver Present: No Vital Signs   Pain Pain Assessment Pain Scale: 0-10 Pain Score: 0-No pain Home Living/Prior Functioning Home Living Family/patient expects to be discharged to:: Private residence Living Arrangements: Alone Available Help at Discharge: Friend(s),Available PRN/intermittently Type of Home: House Home Access: Stairs to enter Technical brewer of Steps: 1 Entrance Stairs-Rails: None Home Layout: One level Bathroom Shower/Tub: Multimedia programmer: Standard Bathroom Accessibility: Yes Additional Comments: has GB in shower and shower chair  Lives  With: Alone IADL History Homemaking Responsibilities: Yes Meal Prep Responsibility: Primary Laundry Responsibility: Primary Cleaning Responsibility: Primary Bill Paying/Finance Responsibility: Primary Shopping Responsibility: Primary Child Care Responsibility: No Current License: Yes Mode of Transportation: Car Occupation: Full time employment Type of  Occupation: Actor Leisure and Hobbies: motorcycles , taking care of his cat Prior Function Level of Independence: Independent with basic ADLs,Independent with gait,Independent with transfers,Independent with homemaking with ambulation  Able to Take Stairs?: Yes Driving: Yes Vocation: Full time employment Vocation Requirements: Works in Probation officer Comments: I with ADLs/IADLs; driving; cooking/cleaning; working Vision Baseline Vision/History: Wears glasses Wears Glasses: At all times Patient Visual Report: Other (comment) (reports mild blurring of vision, is able to read time from clock on wall) Vision Assessment?: No apparent visual deficits Eye Alignment: Within Functional Limits Ocular Range of Motion: Within Functional Limits Alignment/Gaze Preference: Within Defined Limits Tracking/Visual Pursuits: Able to track stimulus in all quads without difficulty Perception  Perception: Within Functional Limits Praxis Praxis: Intact Cognition Overall Cognitive Status: Within Functional Limits for tasks assessed Arousal/Alertness: Awake/alert Orientation Level: Person;Place;Situation Person: Oriented Place: Oriented Situation: Oriented Year: 2022 Month: April Day of Week: Correct Memory: Appears intact Immediate Memory Recall: Bed;Blue;Sock Memory Recall Sock: Without Cue Memory Recall Blue: Without Cue Memory Recall Bed: Without Cue Attention: Sustained Sustained Attention: Appears intact Problem Solving: Appears intact Safety/Judgment: Appears intact Comments: Uses phone calendar to recall day of week;  anxious affect, but overall demonstrates good insight into deficits and waiting for therapist instruction before completing transfers. Sensation Sensation Light Touch: Appears Intact Hot/Cold: Not tested Proprioception: Appears Intact Stereognosis: Appears Intact Coordination Gross Motor Movements are Fluid and Coordinated: No Fine Motor Movements are Fluid and Coordinated: Yes (grossly WFL for ADL) Coordination and Movement Description: mild dysmetria RUE and RLE Finger Nose Finger Test: mild decreased speed on the R side, overshoots target on L side with both R/L hand Heel Shin Test: mild decreased speed on the R vs L. 9 Hole Peg Test: R: 1 min 22 seconds, L: 40 seconds Motor  Motor Motor: Hemiplegia Motor - Skilled Clinical Observations: mild R sided hemiplegia  Trunk/Postural Assessment  Cervical Assessment Cervical Assessment: Exceptions to [facility] (forward head) Thoracic Assessment Thoracic Assessment: Exceptions to [facility] (rounded shoulders) Lumbar Assessment Lumbar Assessment: Exceptions to [facility] (decreased lordosis) Postural Control Postural Control: Within Functional Limits (grossly WFL for ADL)  Balance Balance Balance Assessed: Yes Standardized Balance Assessment Standardized Balance Assessment: Berg Balance Test Berg Balance Test Sit to Stand: Able to stand  independently using hands Standing Unsupported: Able to stand safely 2 minutes Sitting with Back Unsupported but Feet Supported on Floor or Stool: Able to sit safely and securely 2 minutes Stand to Sit: Controls descent by using hands Transfers: Able to transfer safely, definite need of hands Standing Unsupported with Eyes Closed: Able to stand 10 seconds with supervision Standing Ubsupported with Feet Together: Able to place feet together independently and stand for 1 minute with supervision From Standing, Reach Forward with Outstretched Arm: Can reach forward >12 cm safely (5") From Standing Position, Pick up Object  from Floor: Able to pick up shoe, needs supervision From Standing Position, Turn to Look Behind Over each Shoulder: Turn sideways only but maintains balance Turn 360 Degrees: Able to turn 360 degrees safely but slowly Standing Unsupported, Alternately Place Feet on Step/Stool: Able to complete >2 steps/needs minimal assist Standing Unsupported, One Foot in Front: Able to plae foot ahead of the other independently and hold 30 seconds Standing on One Leg: Tries to lift leg/unable to hold 3 seconds but remains standing independently Total Score: 38 Dynamic Gait Index Level Surface: Mild Impairment Change in Gait Speed: Mild Impairment Gait with Horizontal Head Turns: Mild Impairment Gait with Vertical Head Turns: Moderate  Impairment Gait and Pivot Turn: Moderate Impairment Step Over Obstacle: Mild Impairment Step Around Obstacles: Mild Impairment Steps: Moderate Impairment Total Score: 13 Static Sitting Balance Static Sitting - Balance Support: No upper extremity supported Static Sitting - Level of Assistance: 7: Independent Dynamic Sitting Balance Dynamic Sitting - Balance Support: No upper extremity supported Dynamic Sitting - Level of Assistance: 5: Stand by assistance Dynamic Sitting - Balance Activities: Reaching across midline;Reaching for objects Sitting balance - Comments: sitting EOB for LB ADLs with no LOB Static Standing Balance Static Standing - Balance Support: No upper extremity supported Static Standing - Level of Assistance: 5: Stand by assistance Dynamic Standing Balance Dynamic Standing - Balance Support: No upper extremity supported;During functional activity Dynamic Standing - Level of Assistance: 5: Stand by assistance Extremity/Trunk Assessment RUE Assessment RUE Assessment: Within Functional Limits Active Range of Motion (AROM) Comments: 3/4 to full shoulder flexion General Strength Comments: 5/5 in shoulder flexion, 58 lbs grip strength, mild dysmetria LUE  Assessment LUE Assessment: Within Functional Limits Active Range of Motion (AROM) Comments: 3/4 to full shoulder flexion General Strength Comments: 5/5 in shoulder flexion, 60 lbs grip strength  Care Tool Care Tool Self Care Eating   Eating Assist Level: Independent    Oral Care    Oral Care Assist Level: Contact Guard/Toucning assist (standing)    Bathing   Body parts bathed by patient: Right arm;Left arm;Chest;Abdomen;Buttocks;Left upper leg;Right upper leg;Right lower leg;Left lower leg;Front perineal area;Face     Assist Level: Contact Guard/Touching assist    Upper Body Dressing(including orthotics)   What is the patient wearing?: Pull over shirt   Assist Level: Supervision/Verbal cueing    Lower Body Dressing (excluding footwear)   What is the patient wearing?: Pants Assist for lower body dressing: Contact Guard/Touching assist    Putting on/Taking off footwear   What is the patient wearing?: Shoes Assist for footwear: Supervision/Verbal cueing       Care Tool Toileting Toileting activity   Assist for toileting: Contact Guard/Touching assist     Care Tool Bed Mobility Roll left and right activity   Roll left and right assist level: Supervision/Verbal cueing    Sit to lying activity   Sit to lying assist level: Supervision/Verbal cueing    Lying to sitting edge of bed activity   Lying to sitting edge of bed assist level: Supervision/Verbal cueing     Care Tool Transfers Sit to stand transfer   Sit to stand assist level: Supervision/Verbal cueing    Chair/bed transfer   Chair/bed transfer assist level: Supervision/Verbal cueing     Toilet transfer   Assist Level: Supervision/Verbal cueing     Care Tool Cognition Expression of Ideas and Wants Expression of Ideas and Wants: Without difficulty (complex and basic) - expresses complex messages without difficulty and with speech that is clear and easy to understand   Understanding Verbal and Non-Verbal  Content Understanding Verbal and Non-Verbal Content: Understands (complex and basic) - clear comprehension without cues or repetitions   Memory/Recall Ability *first 3 days only Memory/Recall Ability *first 3 days only: Current season;Location of own room;Staff names and faces;That he or she is in a hospital/hospital unit    Refer to Care Plan for [place] 1 OT Short Term Goal 1 (Week 1): STG = LTG 2/2 ELOS  Recommendations for other services: Neuropsych and Therapeutic Recreation  Pet therapy, Kitchen group, Stress management and Outing/community reintegration   Skilled Therapeutic Intervention ADL ADL Eating: Independent  Where Assessed-Eating: Chair;Edge of bed Grooming: Supervision/safety Where Assessed-Grooming: Edge of bed;Sitting at sink Upper Body Bathing: Supervision/safety Where Assessed-Upper Body Bathing: Shower Lower Body Bathing: Contact guard Where Assessed-Lower Body Bathing: Shower Upper Body Dressing: Supervision/safety Where Assessed-Upper Body Dressing: Edge of bed Lower Body Dressing: Contact guard Where Assessed-Lower Body Dressing: Edge of bed Toileting: Contact guard Where Assessed-Toileting: Glass blower/designer: Therapist, music Method: Counselling psychologist: Grab bars;Raised toilet seat;Bedside Microbiologist: Curator Method: Heritage manager: Grab bars;Shower seat with back Mobility  Bed Mobility Bed Mobility: Rolling Left;Supine to Sit Rolling Left: Supervision/Verbal cueing Supine to Sit: Supervision/Verbal cueing Transfers Sit to Stand: Contact Guard/Touching assist Stand to Sit: Contact Guard/Touching assist  Session Note: Pt received semi-reclined in bed, agreeable to OT eval. Denies pain. Reviewed role of CIR OT, evaluation process, ADL/func mobility retraining, goals for therapy, and safety plan. Evaluation  completed as documented above with session focus on simulated bathing, dressing, walk-in shower transfer, and kitchen management. Pt declines ADL as he reports already having gotten dressed and waiting for ex-fiance to bring new clothes, agreeable to simulation. Bed mobility with close S. Donned B shoes with increased time and close S sitting EOB. Reports hx of arthritis in B hands. STS and functional mobility throughout session with CGA with and without RW. Noted decreased R step/stride length and RUE swing. Pt reports feeling his biggest barrier is his coordination and balance, he is anxious to return to PLOF and highly motivated to participate in therapies. Completed walk-in shower transfer with CGA using wooden lip and shower chair, pt reports he uses a shower chair at baseline. Additionally, discussed kitchen management, pt was able to retrieve plate, food, and place in cabinet at knee height with CGA. Reports he uses paper plates/cups and microwaves food vs cooking primarily. Pt did req min A to power up into standing from low surface couch, otherwise req CGA from shower seat, mat, and bed. Additionally, assessed B grip strength and administered 9HPT as documented above. Returned to room same manner as above and pt left in recliner with chair alarm on, call bell in reach, all immediate needs met.   Discharge Criteria: Patient will be discharged from OT if patient refuses treatment 3 consecutive times without medical reason, if treatment goals not met, if there is a change in medical status, if patient makes no progress towards goals or if patient is discharged from hospital.  The above assessment, treatment plan, treatment alternatives and goals were discussed and mutually agreed upon: by patient   Volanda Napoleon MS, OTR/L  10/22/2020, 12:41 PM

## 2020-10-23 LAB — GLUCOSE, CAPILLARY
Glucose-Capillary: 161 mg/dL — ABNORMAL HIGH (ref 70–99)
Glucose-Capillary: 165 mg/dL — ABNORMAL HIGH (ref 70–99)
Glucose-Capillary: 143 mg/dL — ABNORMAL HIGH (ref 70–99)
Glucose-Capillary: 196 mg/dL — ABNORMAL HIGH (ref 70–99)

## 2020-10-23 MED ORDER — MELATONIN 5 MG PO TABS
5.0000 mg | ORAL_TABLET | Freq: Every day | ORAL | Status: DC
  Administered 2020-10-23 – 2020-10-27 (×5): 5 mg via ORAL
  Filled 2020-10-23 (×5): qty 1

## 2020-10-23 NOTE — Progress Notes (Signed)
Patient ID: [name], male   DOB: 11/28/1960, 60 y.o.   MRN: [id number] Met with the patient to review role of the nurse CM and address educational needs. Patient noted dietician had provided education on a [place] diet. Reviewed secondary stroke risks including HTN, HF, HLD, Weight, tobacco use. Patient with hx of MI and DM (A1c 8.7). Patient reported that he was aware of his risks and had been working on a home gym when he had a stroke. Difficult to cook healthy for one person and reported easier to grab food at a fast food restaurant and go; "what fast food restaurant has healthy choices?". Discussed dietary modifications and suggestions for food exchanges. Patient given handbooks for reference and review later on items reviewed. Continue to follow along to discharge to address educational needs and collaborate with the SW to facilitate preparation for discharge and smooth transition to home. [name]

## 2020-10-23 NOTE — Progress Notes (Signed)
Occupational Therapy Session Note  Patient Details  Name: [name] MRN: [id number] Date of Birth: Oct 04, 1960  Today's Date: 10/23/2020 OT Individual Time: 1303-1407 OT Individual Time Calculation (min): 64 min    Short Term Goals: Week 1:  OT Short Term Goal 1 (Week 1): STG = LTG 2/2 ELOS  Skilled Therapeutic Interventions/Progress Updates:    Pt received seated in recliner, denies pain, agreeable to therapy. STS and  amb to and from gym no AD with CGA, min VCs for reciprocal BUE swing. Extensive conversation with pt re purpose of OT/PT and CIR stay, pt noted with increased anxiety about expectations of stay and fear of falling. Pt feels therapy is not what he expected, but would often state contradictory statements (I.e., "I want to use the RW," but later "I don't want to overcompensate with the RW." To target dynamic standing balance, activity tolerance, and RUE/LE NMR, pt completed 6 min at level 8 resistance before req seated rest break. Edu on mod RPE and ideal fatigue level for exercise. Pt would benefit from further education on topic and fall recovery. Completed 1x20 modified sit ups with STS and overreach with 2 kg medicine ball. On balance pad, completed 1x20 torso twists, and finally placed cones behind him on R side. Completed all activities with overall CGA. Amb transfer back to recliner. Donned B shoes with set-up. Pt left in recliner with chair alarm engaged, call bell in reach, and all immediate needs met.   Therapy Documentation Precautions:  Precautions Precautions: Fall Precaution Comments: loop recorder Restrictions Weight Bearing Restrictions: No Pain: Pain Assessment Pain Scale: 0-10 Pain Score: 0-No pain ADL: See Care Tool for more details.   Therapy/Group: Individual Therapy  Volanda Napoleon MS, OTR/L  10/23/2020, 2:54 PM

## 2020-10-23 NOTE — Progress Notes (Signed)
Physical Therapy Session Note  Patient Details  Name: [name] MRN: [id number] Date of Birth: July 02, 1961  Today's Date: 10/23/2020 PT Individual Time: 0802-0840 PT Individual Time Calculation (min): 38 min   Short Term Goals: Week 1:  PT Short Term Goal 1 (Week 1): STG=:LTG due to ELOS  Skilled Therapeutic Interventions/Progress Updates:   Pt received sitting in recliner and agreeable to PT. Pt able to don shoe sitting in [facility] with set up assist from PT for safety.   Gait training with RW through hall with supervision assist x 126f, cues for improved safety and step length in turns. Gait training without AD in rehab gym x [id number] supervision assist for safety and min cues for improved reciprocal arm swing and step length. Gait training also performed with SPC in hall x [id number] supervision assist and min-mod cues for improved 3 point gait pattern to improve safety and normalize gait pattern  Dynamic gait training to weave through 10 cones with supervision assist with cues for awareness of obstacles and improved step length in turns. Weave through 10 cones to perform side step between cones at only 2 ft with CGA to improve step length and width. Forward/reverse gait in parallel bars [id number] with min assist to improve step length and posture.    Pt returned to room and performed ambulatory transfer to bed with [facility] supervision assist. Sit>supine completed without assist, and left supine in bed with call bell in reach and all needs met.       Therapy Documentation Precautions:  Precautions Precautions: Fall Precaution Comments: loop recorder Restrictions Weight Bearing Restrictions: No    Vital Signs: Therapy Vitals Temp: 97.7 F (36.5 C) Pulse Rate: 80 Resp: 20 BP: 135/81 Patient Position (if appropriate): Lying Oxygen Therapy SpO2: 100 % O2 Device: Room Air Pain:   denies   Therapy/Group: Individual Therapy  AustLorie Phenix/2022, 8:42 AM

## 2020-10-23 NOTE — IPOC Note (Signed)
Overall Plan of Care [facility]) Patient Details Name: [name] MRN: [id number] DOB: 1961-01-15  Admitting Diagnosis: Left thalamic infarction [facility])  Hospital Problems: Principal Problem:   Left thalamic infarction [facility])     Functional Problem List: Nursing Safety  PT Balance,Edema,Endurance,Motor,Safety,Sensory  OT Balance,Endurance,Motor  SLP    TR         Basic ADL's: OT Grooming,Bathing,Dressing,Toileting     Advanced  ADL's: OT Simple Meal Preparation     Transfers: PT Bed Mobility,Bed to [place]  OT Toilet,Tub/Shower     Locomotion: PT Ambulation,Wheelchair Mobility,Stairs     Additional Impairments: OT Fuctional Use of Upper Extremity  SLP        TR      Anticipated Outcomes Item Anticipated Outcome  Self Feeding ind  Swallowing      Basic self-care  mod I  Toileting  mod I   Bathroom Transfers mod I  Bowel/Bladder  n/a  Transfers  Mod I with LRAD  Locomotion  Modified Indep, ambulatory, with LRAD  Communication     Cognition     Pain  n/a  Safety/Judgment  Maintain safety with cues/reminders   Therapy Plan: PT Intensity: Minimum of 1-2 x/day ,45 to 90 minutes PT Frequency: 5 out of 7 days PT Duration Estimated Length of Stay: 6-8 days OT Intensity: Minimum of 1-2 x/day, 45 to 90 minutes OT Frequency: 5 out of 7 days OT Duration/Estimated Length of Stay: 7 to 9 days     Due to the current state of emergency, patients may not be receiving their 3-hours of Medicare-mandated therapy.   Team Interventions: Nursing Interventions Patient/Family Education  PT interventions Ambulation/gait training,Balance/vestibular training,Discharge planning,Community reintegration,Cognitive remediation/compensation,Disease management/prevention,DME/adaptive equipment instruction,Functional electrical stimulation,Functional mobility training,Neuromuscular re-education,Pain management,Patient/family education,Psychosocial support,Skin  care/wound management,Splinting/orthotics,Stair training,Therapeutic Activities,Therapeutic Exercise,UE/LE Strength taining/ROM,UE/LE Coordination activities,Wheelchair propulsion/positioning,Visual/perceptual remediation/compensation  OT Interventions Balance/vestibular training,Disease mangement/prevention,Neuromuscular re-education,Self Care/advanced ADL retraining,Therapeutic Exercise,UE/LE Strength taining/ROM,Skin care/wound Cytogeneticist instruction,Community reintegration,Patient/family education,UE/LE Coordination activities,Therapeutic Activities,Psychosocial support,Functional mobility training,Discharge planning  SLP Interventions    TR Interventions    SW/CM Interventions Discharge Planning,Psychosocial Support,Patient/Family Education,Disease Management/Prevention   Barriers to Discharge MD  Medical stability  Nursing Lack of/limited family support,Home environment access/layout    PT Lack of/limited family support,Decreased caregiver support,Home environment access/layout,Medication compliance    OT      SLP      SW Lack of/limited family support,Decreased caregiver support     Team Discharge Planning: Destination: PT-Home ,OT- Home , SLP-  Projected Follow-up: PT-Home health PT, OT-  Outpatient OT, SLP-  Projected Equipment Needs: PT-To be determined, OT- To be determined, SLP-  Equipment Details: PT- , OT-  Patient/family involved in discharge planning: PT- Patient,  OT-Patient, SLP-   MD ELOS: 7d Medical Rehab Prognosis:  Excellent Assessment:  60 year old right-handed male with history of CAD/STEMI maintained on aspirin, tobacco abuse, obesity with BMI 36.32, diabetes mellitus, chronic systolic congestive heart failure, anemia and colon cancer. Per chart review lives alone. Independent prior to admission. Presented 10/18/2020 with balance difficulties x2 days and noted fall without loss of consciousness. He denied any weakness or  double vision. Patient initial MRI at [facility] demonstrated multifocal embolic appearing strokes in the posterior circulation including the right cerebellum and left thalamus, periventricular white matter. Patient did not receive TPA. CT angiogram of head and neck negative CTA for large vessel occlusion. 50% atheromatous stenosis about the left carotid bulb/proximal left ICA. Bilateral lower extremity Dopplers negative. Echocardiogram with ejection fraction of 55 to 60% grade  2 diastolic dysfunction. Admission chemistries unremarkable except glucose 184, urinalysis negative, hemoglobin A1c 8.7, urine drug screen positive benzos, troponin negative. Presently maintained on Brilinta for CVA prophylaxis as well as low-dose aspirin. Patient was scheduled for TEEshowed no cardiac source of embolus without thrombus negative for PFOand loop recorder was placed4/11/2020. Therapy evaluations completed due to patient decreased functional mobility was admitted for a comprehensive rehab program.   Now requiring 24/7 Rehab RN,MD, as well as CIR level PT, OT and SLP.  Treatment team will focus on ADLs and mobility with goals set at Mod I See Team Conference Notes for weekly updates to the plan of care

## 2020-10-23 NOTE — Progress Notes (Signed)
Physical Therapy Session Note  Patient Details  Name: [name] MRN: [id number] Date of Birth: 1961/02/01  Today's Date: 10/23/2020 PT Individual Time: 3893-7342 PT Individual Time Calculation (min): 39 min   Short Term Goals: Week 1:  PT Short Term Goal 1 (Week 1): STG=:LTG due to ELOS  Skilled Therapeutic Interventions/Progress Updates:   Pt received sitting in WC and agreeable to PT. Gait training through hall of rehab unit 2 x 241f with supervision assist, no AD, and cues for improved step length and reciprocal arm swing. PT instructed pt in 6 minute walk test x [id number] supervision assist; no breaks needed. PT instructed pt in dynamic balance training to perform dynamic gait to step over 1inch obstacles on floor x 8 and over 4 inch obstacles x 6. Cues for leading to RLE and LLE 50% of trials with min cues for weight shifting and step height. CGA- min assist from PT due to mild R LOB x 3 throghout dynamic gait training. Patient returned to room and performed stand pivot to recliner with supervision assist with no AD. Pt left sitting in recliner with call bell in reach and all needs met.         Therapy Documentation Precautions:  Precautions Precautions: Fall Precaution Comments: loop recorder Restrictions Weight Bearing Restrictions: No   Vital Signs: Therapy Vitals Temp: 98.3 F (36.8 C) Pulse Rate: (!) 103 Resp: 16 BP: 126/83 Patient Position (if appropriate): Sitting Oxygen Therapy SpO2: 100 % O2 Device: Room Air Pain: Pain Assessment Pain Scale: 0-10 Pain Score: 0-No pain   Therapy/Group: Individual Therapy  AuLorie Phenix/01/2021, 4:21 PM

## 2020-10-23 NOTE — Progress Notes (Signed)
Physical Therapy Session Note  Patient Details  Name: [name] MRN: [id number] Date of Birth: 12/02/1960  Today's Date: 10/23/2020 PT Individual Time: 0901-1002 PT Individual Time Calculation (min): 61 min   Short Term Goals: Week 1:  PT Short Term Goal 1 (Week 1): STG=:LTG due to ELOS  Skilled Therapeutic Interventions/Progress Updates:  Patient supine in bed upon PT arrival. Patient alert but initially unhappy and irritated. When questioned, pt states he did not sleep well d/t noise level in hospital and unhappy with amount of time he spends in bed. Pt thought that he would be doing a lot more exercising while receiving therapy. Attempt to explain to pt that therapy is different for each individual and is specific to the impairments noted in patient. With pt's specific evaluation with PT yesterday, pt's impairments noted to be more related to balance and coordination rather than strength and endurance, so therapy sessions will be more geared toward improving pt's balance overall. Pt in agreement to "get PT session overwith". Throughout session, pt questioned this therapists' ability to adequately keep pt safe and prevent him from falling. Patient denied pain during session.  Therapeutic Activity: Bed Mobility: Patient performed supine <> sit with supervision. VC only required for improved positioning in bed upon return to supine. Transfers: Patient performed STS and SPVT transfers with supervision with no AD and no significant LOB. Upon initial stance from EOB, pt asked if he felt any dizziness, and pt responds annoyingly with "no". Pt informed that since this is PT's initial meeting with pt, there are certain questions that will be asked, and continued resistance against therapist will not be necessary.  Gait Training:  Patient ambulated >300 feet x2 using no AD with supervision and intermittent CGA for minor LOBs that pt corrects with stepping strategy. Ambulated with fair pace, good  stride, and upright posture. Provided verbal cues for maintaining level and forward facing gaze in order to maintain balance. Gait belt used for 2nd bot with pt carrying SPC in L hand for improved confidence but not using it to steady balance.  Neuromuscular Re-ed: NMR facilitated during session with focus on standing balance. Pt guided in static balance on Airex pad using 3# weighted ball for arm movements pressing A/P from chest and pressing overhead and pt able to withstand self perturbations. Progressed to passing weight from hand to hand overhead and returning arms outstretched side to side. No LOB. Prior to activity, pt pushes to have L hand support for increased safety as pt does not believe that PT will be able to keep pt from falling if he loses balance during activity. Support provided but instructed not to use unless absolutely necessary. Pt then progressed to star drill with focus on R toe touches to color dots on floor. Minor LOB noted with cross body touches to contralateral side. After activity, attempt to inform pt that he was not allowed to fall and maintained balance with therapist's assistance. Pt uninterested and with focus on other topic.   Pt guided in ladder drill with forward stepping with both BLE into each square leading with R, then L, then R and on. Interruptions to momentum prove difficult initially and pt requires assist with maintaining focus on next appropriate step progression throughout. Progressed to side stepping leading with RLE and requires vc throughout for increased step height and lateral length in order provide enough room for LLE to step into square. Decreased gluteus medius strength apparent with pt's compensatory turn of pelvis for more hip flexion  in RLE advancement instead of true lateral flexion. NMR performed for improvements in motor control and coordination, balance, sequencing, judgement, and self confidence/ efficacy in performing all aspects of mobility at  highest level of independence.   May require therex focus with minisquats/ squats with no UE support and true hip abduction for hip strengthening to improve balance.  Patient supine in bed at end of session with brakes locked, bed alarm set, and all needs within reach.   Therapy Documentation Precautions:  Precautions Precautions: Fall Precaution Comments: loop recorder Restrictions Weight Bearing Restrictions: No  Therapy/Group: Individual Therapy  Alger Simons PT, DPT 10/23/2020, 3:39 PM

## 2020-10-23 NOTE — Progress Notes (Signed)
PROGRESS NOTE   Subjective/Complaints:  Pt asking about LTD,pt has a policy Still some soreness from loop insertion  ROS- neg CP, SOB, N/V/D  Objective:   EP PPM/ICD IMPLANT  Result Date: 10/21/2020 SURGEON:  [name], Duke   PREPROCEDURE DIAGNOSIS:  Cryptogenic Stroke   POSTPROCEDURE DIAGNOSIS:  Cryptogenic Stroke    PROCEDURES:  1. Implantable loop recorder implantation   INTRODUCTION:  [name] is a 60 y.o. male with a history of unexplained stroke who presents today for implantable loop implantation.  The patient has had a cryptogenic stroke.  Despite an extensive workup by neurology, no reversible causes have been identified.  he has worn telemetry during which he did not have arrhythmias.  There is significant concern for possible atrial fibrillation as the cause for the patients stroke.  The patient therefore presents today for implantable loop implantation.   DESCRIPTION OF PROCEDURE:  Informed written consent was obtained.  The patient required no sedation for the procedure today.  The patients left chest was prepped and draped. Mapping over the patient's chest was performed to identify the appropriate ILR site.  This area was found to be the left  parasternal region over the 3rd-4th intercostal space.  The skin overlying this region was infiltrated with lidocaine for local analgesia.  A 0.5-cm incision was made at the implant site.  A subcutaneous ILR pocket was fashioned using a combination of sharp and blunt dissection.  A Medtronic Reveal Linq model [id number] implantable loop recorder was then placed into the pocket R waves were very prominent and measured > 0.2 mV. EBL<1 ml.  Steri- Strips and a sterile dressing were then applied.  There were no early apparent complications.   CONCLUSIONS:  1. Successful implantation of a Medtronic Reveal LINQ implantable loop recorder for cryptogenic stroke  2. No early apparent complications.  [name] Duke, [facility] 10/21/2020 1:08 PM   ECHO TEE  Result Date: 10/21/2020    TRANSESOPHOGEAL ECHO REPORT   Patient Name:   [name] Date of Exam: 10/21/2020 Medical Rec #:  [id number]      Height:       68.0 in Accession #:    [id number]     Weight:       250.0 lb Date of Birth:  04-14-1961      BSA:          2.247 m Patient Age:    55 years       BP:           145/81 mmHg Patient Gender: M              HR:           104 bpm. Exam Location:  Inpatient Procedure: 3D Echo, Transesophageal Echo, Cardiac Doppler and Color Doppler Indications:     Stroke  History:         Patient has prior history of Echocardiogram examinations, most                  recent 10/19/2020. Previous Myocardial Infarction, Abnormal ECG,                  Signs/Symptoms:Chest Pain;  Risk Factors:Hypertension,                  Dyslipidemia and Diabetes. Cancer.  Sonographer:     [name] Referring Phys:  [place] Diagnosing Phys: [name] PROCEDURE: After discussion of the risks and benefits of a TEE, an informed consent was obtained from the patient. The transesophogeal probe was passed without difficulty through the esophogus of the patient. Imaged were obtained with the patient in a supine position. Sedation performed by different physician. The patient was monitored while under deep sedation. Anesthestetic sedation was provided intravenously by Anesthesiology: 215mg  of Propofol. The patient's vital signs; including heart rate, blood pressure, and oxygen saturation; remained stable throughout the procedure. The patient developed no complications during the procedure. IMPRESSIONS  1. Left ventricular ejection fraction, by estimation, is 55 to 60%. The left ventricle has normal function. There is mild left ventricular hypertrophy.  2. Right ventricular systolic function is normal. The right ventricular size is normal.  3. Left atrial size was moderately dilated. No left atrial/left atrial appendage thrombus was  detected.  4. The mitral valve is grossly normal. Trivial mitral valve regurgitation.  5. The aortic valve is tricuspid. Aortic valve regurgitation is not visualized. Conclusion(s)/Recommendation(s): No LA/LAA thrombus identified. Negative bubble study for interatrial shunt. No intracardiac source of embolism detected on this on this transesophageal echocardiogram. FINDINGS  Left Ventricle: Left ventricular ejection fraction, by estimation, is 55 to 60%. The left ventricle has normal function. The left ventricular internal cavity size was normal in size. There is mild left ventricular hypertrophy. Right Ventricle: The right ventricular size is normal. No increase in right ventricular wall thickness. Right ventricular systolic function is normal. Left Atrium: Left atrial size was moderately dilated. No left atrial/left atrial appendage thrombus was detected. Right Atrium: Right atrial size was normal in size. Pericardium: There is no evidence of pericardial effusion. Mitral Valve: The mitral valve is grossly normal. Trivial mitral valve regurgitation. Tricuspid Valve: The tricuspid valve is grossly normal. Tricuspid valve regurgitation is trivial. Aortic Valve: The aortic valve is tricuspid. Aortic valve regurgitation is not visualized. Pulmonic Valve: The pulmonic valve was normal in structure. Pulmonic valve regurgitation is not visualized. Aorta: The aortic root and ascending aorta are structurally normal, with no evidence of dilitation. IAS/Shunts: No atrial level shunt detected by color flow Doppler. Agitated saline contrast was given intravenously to evaluate for intracardiac shunting. [name] Electronically signed by [name] Signature Date/Time: 10/21/2020/2:25:10 PM    Final    Recent Labs    10/22/20 0543  WBC 7.2  HGB 12.9*  HCT 37.1*  PLT 253   Recent Labs    10/22/20 0543  NA 139  K 3.4*  CL 111  CO2 23  GLUCOSE 137*  BUN 9  CREATININE 0.86  CALCIUM 8.4*     Intake/Output Summary (Last 24 hours) at 10/23/2020 0815 Last data filed at 10/22/2020 1700 Gross per 24 hour  Intake 537 ml  Output --  Net 537 ml        Physical Exam: Vital Signs Blood pressure 135/81, pulse 80, temperature 97.7 F (36.5 C), resp. rate 20, height 5\' 8"  (1.727 m), weight 113.4 kg, SpO2 100 %.   General: No acute distress Mood and affect are appropriate Heart: Regular rate and rhythm no rubs murmurs or extra sounds Lungs: Clear to auscultation, breathing unlabored, no rales or wheezes Abdomen: Positive bowel sounds, soft nontender to palpation, nondistended Extremities: No  clubbing, cyanosis, or edema Skin: No evidence of breakdown, no evidence of rash   Neurologic: Cranial nerves II through XII intact, motor strength is 5/5 in bilateral deltoid, bicep, tricep, grip, hip flexor, knee extensors, ankle dorsiflexor and plantar flexor Sensory exam normal sensation to light touch and proprioception in bilateral upper and lower extremities Cerebellar exam normal finger to nose to finger as well as heel to shin in bilateral upper and lower extremities Finger to thumb intact BUE  Musculoskeletal: Full range of motion in all 4 extremities. No joint swelling, dactylitis   Assessment/Plan: 1. Functional deficits which require 3+ hours per day of interdisciplinary therapy in a comprehensive inpatient rehab setting.  Physiatrist is providing close team supervision and 24 hour management of active medical problems listed below.  Physiatrist and rehab team continue to assess barriers to discharge/monitor patient progress toward functional and medical goals  Care Tool:  Bathing    Body parts bathed by patient: Right arm,Left arm,Chest,Abdomen,Buttocks,Left upper leg,Right upper leg,Right lower leg,Left lower leg,Front perineal area,Face         Bathing assist Assist Level: Contact Guard/Touching assist     Upper Body Dressing/Undressing Upper body dressing    What is the patient wearing?: Pull over shirt    Upper body assist Assist Level: Independent    Lower Body Dressing/Undressing Lower body dressing      What is the patient wearing?: Pants     Lower body assist Assist for lower body dressing: Independent     Toileting Toileting    Toileting assist Assist for toileting: Contact Guard/Touching assist     Transfers Chair/bed transfer  Transfers assist     Chair/bed transfer assist level: Supervision/Verbal cueing     Locomotion Ambulation   Ambulation assist      Assist level: Minimal Assistance - Patient > 75% Assistive device: No Device Max distance: 150   Walk 10 feet activity   Assist     Assist level: Minimal Assistance - Patient > 75% Assistive device: No Device   Walk 50 feet activity   Assist    Assist level: Minimal Assistance - Patient > 75% Assistive device: No Device    Walk 150 feet activity   Assist    Assist level: Minimal Assistance - Patient > 75% Assistive device: No Device    Walk 10 feet on uneven surface  activity   Assist     Assist level: Minimal Assistance - Patient > 75%     Wheelchair     Assist Will patient use wheelchair at discharge?: No   Wheelchair activity did not occur: N/A         Wheelchair 50 feet with 2 turns activity    Assist    Wheelchair 50 feet with 2 turns activity did not occur: N/A       Wheelchair 150 feet activity     Assist  Wheelchair 150 feet activity did not occur: N/A       Blood pressure 135/81, pulse 80, temperature 97.7 F (36.5 C), resp. rate 20, height 5\' 8"  (1.727 m), weight 113.4 kg, SpO2 100 %.  Medical Problem List and Plan: 1.  Abnormal gait pattern with gait balance difficulty secondary to left thalamic and posterior CR, bilateral occipital punctate, right cerebellum small infarcts, embolic pattern, cryptogenic origin             -patient may shower             -ELOS/Goals: 7-10 days, Mod  I  goals with PT, OT Pt qualified for FMLA and STD, too early to assess LTD  2.  Antithrombotics: -DVT/anticoagulation: SCDs             -antiplatelet therapy: Aspirin 81 mg daily and Brilinta 90 mg twice daily 3. Pain Management: Tylenol as needed 4. Mood: Valium 5 mg daily as needed anxiety             -antipsychotic agents: N/A 5. Neuropsych: This patient is capable of making decisions on his own behalf. 6. Skin/Wound Care: Routine skin checks 7. Fluids/Electrolytes/Nutrition: Routine in and outs with follow-up chemistries             -pt requested dietary consult for healthier eating habits 8.  Hypertension.  Cozaar 12.5 mg daily.  Monitor with increased mobility Vitals:   10/22/20 2048 10/23/20 0514  BP: (!) 145/93 135/81  Pulse: 84 80  Resp: 17 20  Temp: 98 F (36.7 C) 97.7 F (36.5 C)  SpO2: 100% 100%   9.  Diabetes mellitus with peripheral neuropathy.  Hemoglobin A1c 8.7.  Lantus insulin 40 units daily.       CBG (last 3)  Recent Labs    10/22/20 1140 10/22/20 2139 10/23/20 0616  GLUCAP 159* 191* 161*  goal is 100-180  10.  Hyperlipidemia.  Pravachol 11.  History of CAD with STEMI, continue aspirin 12.  Obesity.  BMI 36.32.  Dietary follow-up 13.  Chronic systolic congestive heart failure.  Monitor for signs of fluid overload.             -check daily weights.  14.  Tobacco abuse.  Counseling 15.  History of colon cancer.  Follow-up outpatient follow-up Dr. [name]    LOS: 2 days A FACE TO [place]ty[place] E Dewain Platz 10/23/2020, 8:15 AM

## 2020-10-24 LAB — GLUCOSE, CAPILLARY
Glucose-Capillary: 159 mg/dL — ABNORMAL HIGH (ref 70–99)
Glucose-Capillary: 124 mg/dL — ABNORMAL HIGH (ref 70–99)
Glucose-Capillary: 177 mg/dL — ABNORMAL HIGH (ref 70–99)

## 2020-10-24 NOTE — Progress Notes (Signed)
Physical Therapy Session Note  Patient Details  Name: [name] MRN: [id number] Date of Birth: 1960-10-09  Today's Date: 10/24/2020 PT Group Time: 0915-1015 PT Group Time Calculation (min): 60 min  Short Term Goals: Week 1:  PT Short Term Goal 1 (Week 1): STG=:LTG due to ELOS  Skilled Therapeutic Interventions/Progress Updates:    Pt received sitting in [facility] and agreeable to group PT treatment  Gait training through obstacle course. To weave through 8 cones and step over 6, 1 inch bars on floor x 2 . CGA for safety with cues for improved step length with navigating over bars on floor. Pt then performed ascent/descent of 4inch step x 6 with CGA for safety and cues for step to gait pattern.   Seated dynamic balance training to toss beach ball and performed trunk rotation as well as tap ball back with 3# bar weight. Performed x 5 minutes with distant supervision assist from RT for safety.   Nustep level 4, x 5 min with instruction to take breaks as needed with steps per min >40.   Kinetron 30cm/sec x 5 minutes. Cues for improved ROM and increased speed as tolerated as well as improved posture intermittently.   Prolonged therapeutic rest break between bouts due to fatigue and to sanitize equipment for pt safety. Patient returned to room and left sitting in [facility] with call bell in reach and all needs met.         Therapy Documentation Precautions:  Precautions Precautions: Fall Precaution Comments: loop recorder Restrictions Weight Bearing Restrictions: No Pain:   denies    Therapy/Group: Group Therapy  Lorie Phenix 10/24/2020, 12:53 PM

## 2020-10-24 NOTE — Progress Notes (Signed)
Patient c/o pain in both legs after ambulation to bathroom. Stated "the pain wont let me go back to sleep". PRN medications administered. Call bell within reach.

## 2020-10-24 NOTE — Progress Notes (Signed)
PROGRESS NOTE   Subjective/Complaints:  THigh pain pt has hx of muscle weakness with statin was placed on it by cardiology    ROS- neg CP, SOB, N/V/D  Objective:   No results found. Recent Labs    10/22/20 0543  WBC 7.2  HGB 12.9*  HCT 37.1*  PLT 253   Recent Labs    10/22/20 0543  NA 139  K 3.4*  CL 111  CO2 23  GLUCOSE 137*  BUN 9  CREATININE 0.86  CALCIUM 8.4*    Intake/Output Summary (Last 24 hours) at 10/24/2020 0748 Last data filed at 10/23/2020 1900 Gross per 24 hour  Intake 760 ml  Output --  Net 760 ml        Physical Exam: Vital Signs Blood pressure 120/73, pulse 84, temperature 98.2 F (36.8 C), resp. rate 16, height 5\' 8"  (1.727 m), weight 113.4 kg, SpO2 96 %.   General: No acute distress Mood and affect are appropriate Heart: Regular rate and rhythm no rubs murmurs or extra sounds Lungs: Clear to auscultation, breathing unlabored, no rales or wheezes Abdomen: Positive bowel sounds, soft nontender to palpation, nondistended Extremities: No clubbing, cyanosis, or edema Skin: No evidence of breakdown, no evidence of rash, Loop site CDI   Neurologic: Cranial nerves II through XII intact, motor strength is 5/5 in bilateral deltoid, bicep, tricep, grip, hip flexor, knee extensors, ankle dorsiflexor and plantar flexor Sensory exam normal sensation to light touch and proprioception in bilateral upper and lower extremities   Musculoskeletal: Full range of motion in all 4 extremities. No joint swelling, dactylitis No thigh pain to palp or with stretch  Assessment/Plan: 1. Functional deficits which require 3+ hours per day of interdisciplinary therapy in a comprehensive inpatient rehab setting.  Physiatrist is providing close team supervision and 24 hour management of active medical problems listed below.  Physiatrist and rehab team continue to assess barriers to discharge/monitor patient  progress toward functional and medical goals  Care Tool:  Bathing    Body parts bathed by patient: Right arm,Left arm,Chest,Abdomen,Buttocks,Left upper leg,Right upper leg,Right lower leg,Left lower leg,Front perineal area,Face         Bathing assist Assist Level: Contact Guard/Touching assist     Upper Body Dressing/Undressing Upper body dressing   What is the patient wearing?: Pull over shirt    Upper body assist Assist Level: Independent    Lower Body Dressing/Undressing Lower body dressing      What is the patient wearing?: Pants     Lower body assist Assist for lower body dressing: Independent     Toileting Toileting    Toileting assist Assist for toileting: Contact Guard/Touching assist     Transfers Chair/bed transfer  Transfers assist     Chair/bed transfer assist level: Supervision/Verbal cueing     Locomotion Ambulation   Ambulation assist      Assist level: Contact Guard/Touching assist Assistive device: No Device Max distance: 150   Walk 10 feet activity   Assist     Assist level: Minimal Assistance - Patient > 75% Assistive device: No Device   Walk 50 feet activity   Assist    Assist level: Minimal Assistance -  Patient > 75% Assistive device: No Device    Walk 150 feet activity   Assist    Assist level: Minimal Assistance - Patient > 75% Assistive device: No Device    Walk 10 feet on uneven surface  activity   Assist     Assist level: Minimal Assistance - Patient > 75%     Wheelchair     Assist Will patient use wheelchair at discharge?: No   Wheelchair activity did not occur: N/A         Wheelchair 50 feet with 2 turns activity    Assist    Wheelchair 50 feet with 2 turns activity did not occur: N/A       Wheelchair 150 feet activity     Assist  Wheelchair 150 feet activity did not occur: N/A       Blood pressure 120/73, pulse 84, temperature 98.2 F (36.8 C), resp. rate 16,  height 5\' 8"  (1.727 m), weight 113.4 kg, SpO2 96 %.  Medical Problem List and Plan: 1.  Abnormal gait pattern with gait balance difficulty secondary to left thalamic and posterior CR, bilateral occipital punctate, right cerebellum small infarcts, embolic pattern, cryptogenic origin             -patient may shower             -ELOS/Goals: 7-10 days, Mod I goals with PT, OT Pt qualified for FMLA and STD, too early to assess LTD  2.  Antithrombotics: -DVT/anticoagulation: SCDs             -antiplatelet therapy: Aspirin 81 mg daily and Brilinta 90 mg twice daily 3. Pain Management: Tylenol as needed 4. Mood: Valium 5 mg daily as needed anxiety             -antipsychotic agents: N/A 5. Neuropsych: This patient is capable of making decisions on his own behalf. 6. Skin/Wound Care: Routine skin checks 7. Fluids/Electrolytes/Nutrition: Routine in and outs with follow-up chemistries             -pt requested dietary consult for healthier eating habits 8.  Hypertension.  Cozaar 12.5 mg daily.  Monitor with increased mobility Vitals:   10/23/20 1937 10/24/20 0609  BP: 134/78 120/73  Pulse: 89 84  Resp: 18 16  Temp: 98.1 F (36.7 C) 98.2 F (36.8 C)  SpO2: 96% 96%   9.  Diabetes mellitus with peripheral neuropathy.  Hemoglobin A1c 8.7.  Lantus insulin 40 units daily.       CBG (last 3)  Recent Labs    10/23/20 1641 10/23/20 2036 10/24/20 0610  GLUCAP 143* 196* 159*  goal is 100-180  10.  Hyperlipidemia.  Pravachol- will stop due to muscle soreness 11.  History of CAD with STEMI, continue aspirin 12.  Obesity.  BMI 36.32.  Dietary follow-up 13.  Chronic systolic congestive heart failure.  Monitor for signs of fluid overload.             -check daily weights.  14.  Tobacco abuse.  Counseling 15.  History of colon cancer.  Follow-up outpatient follow-up Dr. [name]  16.  Hypo K+ mild KCL 45meq daily  LOS: 3 days A FACE TO FACE EVALUATION WAS PERFORMED  Charlett Blake 10/24/2020,  7:48 AM

## 2020-10-24 NOTE — Progress Notes (Signed)
Occupational Therapy Session Note  Patient Details  Name: [name] MRN: [id number] Date of Birth: 11-Sep-1960  Today's Date: 10/24/2020 OT Individual Time: 1448-1856 OT Individual Time Calculation (min): 59 min and 40 min  Short Term Goals: Week 1:  OT Short Term Goal 1 (Week 1): STG = LTG 2/2 ELOS  Skilled Therapeutic Interventions/Progress Updates:    Session [phone]): Pt received semi-reclined in bed, agreeable to therapy, but reports he is upset 2/2 sleeping poorly last night due to BLE pain. Denies pain this session. Agreeable to shower with encouragement. Completed walk-in shower transfer close S, doffed shirt/underwear/pants/socks with close S and min VCs to sit down and use grab bar for LBD. Req min A to pull shirt down in back when getting dressed, otherwise completed dressing with close S. Amb to and from shower with no AD and CGA. Pt overall with anxious affect this session and declines participating in balance class that is to follow session. Pt left semi-reclined in bed with bed alarm engaged, call bell in reach, and all immediate needs met.    Session 2 [phone]): Pt received semi-reclined in bed, denies pain, agreeable to therapy. Amb 500+ft throughout session with close S to CGA. Amb to and from bathroom and stood at toilet to urinate distant S, pt noted to furniture walk, but states it's "more psychological" than actual need to maintain balance. Edu on fall recovery and falls prevention education, pt completed 1 fall recovery transfer from floor with use of bed with good technique. Pt noted to make contradictory statements, initially stating he has fallen only ever once in his bedroom, then later stating he actually fell at work and later in the grocery store parking lot. Pt initially presenting in previous sessions with increased fear of falling, today stating he thinks he will never fall. Provided theraputty exercise handout and reviewed exercises for R FMC/NMR. Pt states  he is worried his arthritis will affect his participation, educated to stop exercises 2/2 onset of pain.   Pt left semi-reclined in bed with bed alarm engaged, call bell in reach, and all immediate needs met.   Therapy Documentation Precautions:  Precautions Precautions: Fall Precaution Comments: loop recorder Restrictions Weight Bearing Restrictions: No Pain: See session notes   ADL: See Care Tool for more details.  Therapy/Group: Individual Therapy  Volanda Napoleon MS, OTR/L  10/24/2020, 6:44 AM

## 2020-10-25 LAB — GLUCOSE, CAPILLARY
Glucose-Capillary: 140 mg/dL — ABNORMAL HIGH (ref 70–99)
Glucose-Capillary: 160 mg/dL — ABNORMAL HIGH (ref 70–99)
Glucose-Capillary: 168 mg/dL — ABNORMAL HIGH (ref 70–99)
Glucose-Capillary: 197 mg/dL — ABNORMAL HIGH (ref 70–99)

## 2020-10-25 NOTE — Progress Notes (Signed)
Physical Therapy Session Note  Patient Details  Name: [name] MRN: [id number] Date of Birth: 09/18/1960  Today's Date: 10/25/2020 PT Individual Time: 0729-0854 PT Individual Time Calculation (min): 85 min   Short Term Goals: Week 1:  PT Short Term Goal 1 (Week 1): STG=:LTG due to ELOS  Skilled Therapeutic Interventions/Progress Updates:    Pt up with NT at sink performing oral hygiene in standing with supervision. Discussed goals of session and overall therapy. Rapport built throughout session as pt reports fear of falling and encouragement and education provided throughout.  Functional gait in room with close supervision without device. Pt self selected use of RW to walk to therapy gym with supervision and cues for positioning of RW to not lean on it and maintain upright posture.   NMR to address higher level balance retraining throughout session including:  Dynamic gait through obstacle course including toe taps to 4" step and then step up/over, sidestepping laterally through cones to R and L repeated x 2 reps x 25' each trial with CGA overall and up to min assist for stair negotiation without UE support.   Biodex lmits of stability, random control, and catch game to challenge balance strategies and provide visual feedback for weightshifts. Compliant and non compliant surface levels 7-10 with mainly UE support bilaterally but did small bouts without UE suppot on limits of stability and static surface only.    Lite gait with harness over treadmill for increased repetition and increasing challenge without UE support. Pt requesting increasing speeds but ultimately PT educated on goal for improved gait quality over speed. Max speed up to 2.4 m/s with distances ranging from 85-288' with PT making adjustments to decrease reliance on UE support (pts initial goal of wanting to be in harness was to limit outside support from PT) and progressed to retro gait at much slower speeds with focus on  coordination. Pt able to self reflect and realize task was harder than he anticipated. Recommended to trial again in another session as treadmill does provide increased reps and different ability to challenge balance and speed of gait as well as could introduce incline.  Returned to room without AD with close supervision overall to occasional CGA in dynamic environment and set up with breakfast tray. Education in regards to stroke recovery provided during rest breaks.   Therapy Documentation Precautions:  Precautions Precautions: Fall Precaution Comments: loop recorder Restrictions Weight Bearing Restrictions: No Pain: Denies pain.   Therapy/Group: Individual Therapy  [name], PT, DPT, CBIS  10/25/2020, 8:59 AM

## 2020-10-25 NOTE — Progress Notes (Signed)
Physical Therapy Session Note  Patient Details  Name: [name] MRN: [id number] Date of Birth: 07-25-1960  Today's Date: 10/25/2020 PT Individual Time: 1133-1208; [id number] PT Individual Time Calculation (min): 35 min , 44 min  Short Term Goals: Week 1:  PT Short Term Goal 1 (Week 1): STG=:LTG due to ELOS  Skilled Therapeutic Interventions/Progress Updates:  tx 1:  Pt resting in bed.  He denied pain.  Supine> sit to R in flat bed, no rails, slowly iwht supervision.  Pt donned shoes in sitting with extra time, supervision.  Sit>< stand throughout session without use of UE. Gait training iwht RW on level tile x 150' with supervision.  neuromuscular re-education via forced use, multimodal cues for RLE stance stability during floor clock activity requriing R/L single limb stance, with bil UE support.  Mod assist at times needed for drifting R during R single limb stance. Sustained stretch bil heel cords and balance challenge, standing with forefeet on blue wedge, x 1 minute.  Pt able to perform alternating R/L UE elevations/punching and tolerate moderate posterior perturbations without LOB.  PT educated pt on status of his balance, fear of falling, and need to challenge his balance in order to retrain balance strategies.   Gait to return to room, RW, kicking small cone with either foot.  Pt had limited success with power to kick with R foot; good success kicking with L foot during R single limb stance.  No LOB, CGA.   At end of session, pt resting in recliner with needs at hand and seat pad alarm set.   tx 2:  Pt dozing in recliner.  He denied pain.  Sit>stand with supervision.  PT transported pt in wc for time mgt.    neuromuscular re-education via demo, forced use, multimodal cues for seated: on elevated mat, reciprocal scooting forward/backward without use of UEs or LEs for core activation; reaching laterally out of BOS with R/L hands to facilitate R trunk shortening/lengthening.   Use of Kinetron at 30 cm/sec in standing, with mirror feedback, for R/L reciprocal movements with bil UE support> 0UE support, focusing on R hip stability with single limb stance.   Gait pushing wc as AD to return to room, 220' with close supervision.   Pt requested standing to use toilet, but then refused when (this male) PT would not leave BR.  Gait to sit on bed, no AD, close supervision.  Supervision for sit> supine.  At end of session, pt resting with HOB elevated, and needs at hand.  Bed alarm set.  PT informed Marjorie Smolder, RN that pt refused to use toilet with male staff.     Therapy Documentation Precautions:  Precautions Precautions: Fall Precaution Comments: loop recorder Restrictions Weight Bearing Restrictions: No       Therapy/Group: Individual Therapy  Sky Borboa 10/25/2020, 12:21 PM

## 2020-10-25 NOTE — Progress Notes (Signed)
Occupational Therapy Session Note  Patient Details  Name: Brayson Livesey MRN: [id number] Date of Birth: February 01, 1961  Today's Date: 10/25/2020 OT Individual Time: 0930-1000 OT Individual Time Calculation (min): 30 min    Short Term Goals: Week 1:  OT Short Term Goal 1 (Week 1): STG = LTG 2/2 ELOS  Skilled Therapeutic Interventions/Progress Updates:    Pt received in recliner agreeable to therapy. He was eager to try ambulation with pushing the grocery cart to work on his gait speed.  He ambulated to gym without AD with CGA and then was able to walk at a brisk pace pushing the grocery cart over 500 feet. He returned to gym work on standing and overhead reaching with nuts and bolts.  No difficulty with R hand FMC.  Ambulated back to room with all needs met.  Opted to lay down. Alarm set.    Therapy Documentation Precautions:  Precautions Precautions: Fall Precaution Comments: loop recorder Restrictions Weight Bearing Restrictions: No       Pain: Pain Assessment Pain Score: 0-No pain    Therapy/Group: Individual Therapy  [place] 10/25/2020, 12:53 PM

## 2020-10-26 LAB — GLUCOSE, CAPILLARY
Glucose-Capillary: 144 mg/dL — ABNORMAL HIGH (ref 70–99)
Glucose-Capillary: 162 mg/dL — ABNORMAL HIGH (ref 70–99)
Glucose-Capillary: 154 mg/dL — ABNORMAL HIGH (ref 70–99)
Glucose-Capillary: 180 mg/dL — ABNORMAL HIGH (ref 70–99)

## 2020-10-26 MED ORDER — INSULIN GLARGINE 100 UNIT/ML ~~LOC~~ SOLN
43.0000 [IU] | Freq: Every day | SUBCUTANEOUS | Status: DC
  Administered 2020-10-26 – 2020-10-27 (×2): 43 [IU] via SUBCUTANEOUS
  Filled 2020-10-26 (×3): qty 0.43

## 2020-10-26 NOTE — Progress Notes (Signed)
PROGRESS NOTE   Subjective/Complaints:  Pt heard he is going home Wednesday , we discussed this is not an official date Discussed goal of safe d/c to home with home therapy and equipment coordinated   ROS- neg CP, SOB, N/V/D  Objective:   No results found. No results for input(s): WBC, HGB, HCT, PLT in the last 72 hours. No results for input(s): NA, K, CL, CO2, GLUCOSE, BUN, CREATININE, CALCIUM in the last 72 hours. No intake or output data in the 24 hours ending 10/26/20 0657      Physical Exam: Vital Signs Blood pressure 118/77, pulse 83, temperature 98.3 F (36.8 C), resp. rate 20, height 5\' 8"  (1.727 m), weight 113.4 kg, SpO2 99 %.   General: No acute distress Mood and affect are appropriate Heart: Regular rate and rhythm no rubs murmurs or extra sounds Lungs: Clear to auscultation, breathing unlabored, no rales or wheezes Abdomen: Positive bowel sounds, soft nontender to palpation, nondistended Extremities: No clubbing, cyanosis, or edema Skin: No evidence of breakdown, no evidence of rash, Loop site CDI   Neurologic: Cranial nerves II through XII intact, motor strength is 5/5 in bilateral deltoid, bicep, tricep, grip, hip flexor, knee extensors, ankle dorsiflexor and plantar flexor Sensory exam normal sensation to light touch and proprioception in bilateral upper and lower extremities   Musculoskeletal: Full range of motion in all 4 extremities. No joint swelling, dactylitis No thigh pain to palp or with stretch  Assessment/Plan: 1. Functional deficits which require 3+ hours per day of interdisciplinary therapy in a comprehensive inpatient rehab setting.  Physiatrist is providing close team supervision and 24 hour management of active medical problems listed below.  Physiatrist and rehab team continue to assess barriers to discharge/monitor patient progress toward functional and medical goals  Care  Tool:  Bathing    Body parts bathed by patient: Right arm,Left arm,Chest,Abdomen,Buttocks,Left upper leg,Right upper leg,Right lower leg,Left lower leg,Front perineal area,Face         Bathing assist Assist Level: Supervision/Verbal cueing     Upper Body Dressing/Undressing Upper body dressing   What is the patient wearing?: Pull over shirt    Upper body assist Assist Level: Minimal Assistance - Patient > 75%    Lower Body Dressing/Undressing Lower body dressing      What is the patient wearing?: Pants,Underwear/pull up     Lower body assist Assist for lower body dressing: Supervision/Verbal cueing     Toileting Toileting    Toileting assist Assist for toileting: Contact Guard/Touching assist     Transfers Chair/bed transfer  Transfers assist     Chair/bed transfer assist level: Supervision/Verbal cueing     Locomotion Ambulation   Ambulation assist      Assist level: Supervision/Verbal cueing Assistive device: Walker-rolling Max distance: 150   Walk 10 feet activity   Assist     Assist level: Supervision/Verbal cueing Assistive device: Walker-rolling   Walk 50 feet activity   Assist    Assist level: Supervision/Verbal cueing Assistive device: Walker-rolling    Walk 150 feet activity   Assist    Assist level: Supervision/Verbal cueing Assistive device: Walker-rolling    Walk 10 feet on uneven surface  activity   Assist     Assist level: Minimal Assistance - Patient > 75%     Wheelchair     Assist Will patient use wheelchair at discharge?: No   Wheelchair activity did not occur: N/A         Wheelchair 50 feet with 2 turns activity    Assist    Wheelchair 50 feet with 2 turns activity did not occur: N/A       Wheelchair 150 feet activity     Assist  Wheelchair 150 feet activity did not occur: N/A       Blood pressure 118/77, pulse 83, temperature 98.3 F (36.8 C), resp. rate 20, height 5\' 8"   (1.727 m), weight 113.4 kg, SpO2 99 %.  Medical Problem List and Plan: 1.  Abnormal gait pattern with gait balance difficulty secondary to left thalamic and posterior CR, bilateral occipital punctate, right cerebellum small infarcts, embolic pattern, cryptogenic origin             -patient may shower             -ELOS/Goals: 7-10 days, Mod I goals with PT, OT Pt qualified for FMLA and STD, too early to assess LTD  2.  Antithrombotics: -DVT/anticoagulation: SCDs             -antiplatelet therapy: Aspirin 81 mg daily and Brilinta 90 mg twice daily 3. Pain Management: Tylenol as needed 4. Mood: Valium 5 mg daily as needed anxiety             -antipsychotic agents: N/A 5. Neuropsych: This patient is capable of making decisions on his own behalf. 6. Skin/Wound Care: Routine skin checks 7. Fluids/Electrolytes/Nutrition: Routine in and outs with follow-up chemistries             -pt requested dietary consult for healthier eating habits 8.  Hypertension.  Cozaar 12.5 mg daily.  Monitor with increased mobility Vitals:   10/25/20 1922 10/26/20 0516  BP: 123/70 118/77  Pulse: 87 83  Resp: 18 20  Temp: 98.6 F (37 C) 98.3 F (36.8 C)  SpO2: 98% 99%   9.  Diabetes mellitus with peripheral neuropathy.  Hemoglobin A1c 8.7.  Lantus insulin 40 units daily.       CBG (last 3)  Recent Labs    10/25/20 1656 10/25/20 2110 10/26/20 0547  GLUCAP 168* 197* 144*  goal is 100-180, increase lantus to 43 U   10.  Hyperlipidemia.  Pravachol- will stop due to muscle soreness 11.  History of CAD with STEMI, continue aspirin 12.  Obesity.  BMI 36.32.  Dietary follow-up 13.  Chronic systolic congestive heart failure.  Monitor for signs of fluid overload.             -check daily weights.  14.  Tobacco abuse.  Counseling 15.  History of colon cancer.  Follow-up outpatient follow-up Dr. [name]  16.  Hypo K+ mild KCL 50meq daily  LOS: 5 days A FACE TO FACE EVALUATION WAS PERFORMED  [name] 10/26/2020, 6:57 AM

## 2020-10-27 LAB — GLUCOSE, CAPILLARY
Glucose-Capillary: 144 mg/dL — ABNORMAL HIGH (ref 70–99)
Glucose-Capillary: 131 mg/dL — ABNORMAL HIGH (ref 70–99)

## 2020-10-27 MED ORDER — ACETAMINOPHEN 325 MG PO TABS: 650.0000 mg | ORAL_TABLET | ORAL | Status: DC | PRN

## 2020-10-27 MED ORDER — METOPROLOL SUCCINATE ER 25 MG PO TB24: 25.0000 mg | ORAL_TABLET | Freq: Every day | ORAL | 3 refills | Status: DC

## 2020-10-27 MED ORDER — INSULIN GLARGINE 100 UNIT/ML SOLOSTAR PEN: 43.0000 [IU] | PEN_INJECTOR | Freq: Every day | SUBCUTANEOUS | 11 refills | Status: DC

## 2020-10-27 MED ORDER — LOSARTAN POTASSIUM 25 MG PO TABS: 12.5000 mg | ORAL_TABLET | Freq: Every day | ORAL | 0 refills | Status: DC

## 2020-10-27 MED ORDER — MELATONIN 5 MG PO TABS: 5.0000 mg | ORAL_TABLET | Freq: Every day | ORAL | 0 refills | Status: DC

## 2020-10-27 MED ORDER — DIAZEPAM 5 MG PO TABS: 5.0000 mg | ORAL_TABLET | Freq: Every day | ORAL | 0 refills | Status: DC | PRN

## 2020-10-27 MED ORDER — TICAGRELOR 90 MG PO TABS: ORAL_TABLET | ORAL | 3 refills | Status: DC

## 2020-10-27 MED ORDER — FOLIC ACID 1 MG PO TABS: 1.0000 mg | ORAL_TABLET | Freq: Every day | ORAL | 0 refills | Status: DC

## 2020-10-27 NOTE — Discharge Summary (Signed)
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 signed by Lyman Bishop MD Signature Date/Time: 10/21/2020/2:25:10 PM    Final    VAS [place] LOWER EXTREMITY VENOUS (DVT)  Result Date: 10/19/2020  Lower Venous DVT Study Indications: Stroke.  Comparison Study: No prior study on file Performing Technologist: Sharion Dove RVS  Examination Guidelines: A complete evaluation includes B-mode imaging, spectral Doppler, color Doppler, and power Doppler as needed of all accessible portions of each vessel. Bilateral testing is considered an integral part of a complete examination. Limited examinations for reoccurring indications may be performed as noted. The reflux portion of the exam is performed with the patient in reverse Trendelenburg.  +---------+---------------+---------+-----------+----------+--------------+ RIGHT    CompressibilityPhasicitySpontaneityPropertiesThrombus Aging +---------+---------------+---------+-----------+----------+--------------+ CFV      Full           Yes      Yes                                 +---------+---------------+---------+-----------+----------+--------------+ SFJ      Full                                                        +---------+---------------+---------+-----------+----------+--------------+ FV Prox  Full                                                         +---------+---------------+---------+-----------+----------+--------------+  FV Mid   Full                                                        +---------+---------------+---------+-----------+----------+--------------+ FV DistalFull                                                        +---------+---------------+---------+-----------+----------+--------------+ PFV      Full                                                        +---------+---------------+---------+-----------+----------+--------------+ POP      Full           Yes      Yes                                 +---------+---------------+---------+-----------+----------+--------------+ PTV      Full                                                        +---------+---------------+---------+-----------+----------+--------------+ PERO     Full                                                        +---------+---------------+---------+-----------+----------+--------------+   +---------+---------------+---------+-----------+----------+--------------+ LEFT     CompressibilityPhasicitySpontaneityPropertiesThrombus Aging +---------+---------------+---------+-----------+----------+--------------+ CFV      Full           Yes      Yes                                 +---------+---------------+---------+-----------+----------+--------------+ SFJ      Full                                                        +---------+---------------+---------+-----------+----------+--------------+ FV Prox  Full                                                        +---------+---------------+---------+-----------+----------+--------------+ FV Mid   Full                                                        +---------+---------------+---------+-----------+----------+--------------+  FV DistalFull                                                         +---------+---------------+---------+-----------+----------+--------------+ PFV      Full                                                        +---------+---------------+---------+-----------+----------+--------------+ POP      Full           Yes      Yes                                 +---------+---------------+---------+-----------+----------+--------------+ PTV      Full                                                        +---------+---------------+---------+-----------+----------+--------------+ PERO     Full                                                        +---------+---------------+---------+-----------+----------+--------------+     Summary: BILATERAL: - No evidence of deep vein thrombosis seen in the lower extremities, bilaterally. -   *See table(s) above for measurements and observations. Electronically signed by Monica Martinez MD on 10/19/2020 at 4:23:09 PM.    Final     Labs:  Basic Metabolic Panel: Recent Labs  Lab 10/22/20 0543  NA 139  K 3.4*  CL 111  CO2 23  GLUCOSE 137*  BUN 9  CREATININE 0.86  CALCIUM 8.4*    CBC: Recent Labs  Lab 10/22/20 0543  WBC 7.2  NEUTROABS 3.8  HGB 12.9*  HCT 37.1*  MCV 95.6  PLT 253    CBG: Recent Labs  Lab 10/26/20 1136 10/26/20 1609 10/26/20 2131 10/27/20 0612 10/27/20 1130  GLUCAP 162* 154* 180* 144* 131*   Family history.  Mother with stomach cancer Brother with diabetes.  Negative for CAD colon cancer or esophageal cancer  Brief HPI:   [name] is a 60 y.o. right-handed male with history of CAD/STEMI maintained on aspirin, tobacco abuse, obesity, diabetes mellitus, chronic systolic congestive heart failure, anemia and colon cancer.  Per chart review lives alone independent prior to admission.  Presented 10/18/2020 with balance difficulties x2 days and noted fall without loss of conscious.  He denied any weakness or double vision.  Patient initially MRI at [facility]  demonstrated multifocal embolic appearing strokes in the posterior circulation including the right cerebellum and left thalamus, periventricular white matter.  Patient did not receive TPA.  CT angiogram of head and neck negative CTA for large vessel occlusion.  50% athermatous stenosis about the left carotid bulb/proximal left ICA.  Bilateral lower extremity Dopplers negative.  Echocardiogram with ejection fraction of 55  to 50% grade 2 diastolic dysfunction.  Admission chemistries unremarkable aside glucose 184 urinalysis negative hemoglobin A1c 8.7 urine drug screen positive benzos, troponin negative.  Maintained on Brilinta for CVA as well as low-dose aspirin.  Patient was scheduled for TEE showed no cardiac source of embolus without thrombus negative for PFO Loop recorder was placed.  Therapy evaluations completed due to patient decreased functional mobility was admitted for a comprehensive rehab program.   Hospital Course: [name] was admitted to rehab 10/21/2020 for inpatient therapies to consist of PT, ST and OT at least three hours five days a week. Past admission physiatrist, therapy team and rehab RN have worked together to provide customized collaborative inpatient rehab.  In regards to patient's left thalamic posterior corona radiata bilateral occipital right cerebellum infarcts remained stable maintained on aspirin as well as Brilinta.  Patient would follow-up with neurology services.  He did have a loop recorder placed and would follow-up with cardiology services.  Blood pressure controlled on Cozaar as well as Toprol and will need outpatient follow-up.  Blood sugars monitored hemoglobin A1c 8.7 insulin therapy as directed with full diabetic teaching.  He had been on Pravachol for hyperlipidemia stopped due to muscle soreness.  History of CAD with STEMI continue low-dose aspirin.  No chest pain or shortness of breath.  Obesity with BMI 36.32 and dietary follow-up.  Chronic systolic congestive  heart failure monitor for any signs of fluid overload.  He did have a history of tobacco abuse received counsel regarding cessation of nicotine products.  History of colon cancer follow-up outpatient Dr. [name].   Blood pressures were monitored on TID basis and controlled  Diabetes has been monitored with ac/hs CBG checks and SSI was use prn for tighter BS control.    Rehab course: During patient's stay in rehab weekly team conferences were held to monitor patient's progress, set goals and discuss barriers to discharge. At admission, patient required minimal guard sit to stand minimal guard stand pivot transfers minimal guard ambulate 200 feet rolling walker  Physical exam.  Blood pressure 127/82 pulse 85 temperature 98 respirations 18 oxygen saturations 100% room air Constitutional.  No acute distress HEENT Head.  Normocephalic and atraumatic Eyes.  Pupils round and reactive to light no discharge that nystagmus Neck.  Supple nontender no JVD without thyromegaly Cardiac regular rate rhythm any extra sounds or murmur heard Abdomen.  Soft nontender positive bowel sounds not rebound Respiratory effort normal no respiratory distress without wheeze Skin.  Warm and dry Neurologic.  Alert oriented normal insight and awareness.  Cranial nerve exam unremarkable although he did have some mild diplopia.  Right upper extremity 4+/5 left lower extremity 5/5 right lower extremity 4/5 left lower extremity 4+/5  He/She  has had improvement in activity tolerance, balance, postural control as well as ability to compensate for deficits. He/She has had improvement in functional use RUE/LUE  and RLE/LLE as well as improvement in awareness.  Patient continued to make good overall gains.  Donned shoes in sitting with extra time supervision sit to stand supervision ambulates 150 feet level tile rolling walker supervision.  Gather his belongings for activities day living and homemaking.  He was advised no driving.  Full  family teaching completed plan discharged to home       Disposition: Discharged to home   Diet: Diabetic diet  Special Instructions: No driving smoking or alcohol  Medications at discharge 1.  Tylenol as needed 2.  Aspirin 81 mg p.o. daily 3.  Temovate  0.05% daily as needed itching 4.  Valium 5 mg daily as needed anxiety 5.  Folic acid 1 mg p.o. daily 6.  Lantus insulin 43 units daily 7.  Cozaar 12.5 mg p.o. daily 8.  Melatonin 5 mg p.o. daily 9.  Toprol-XL 25 mg p.o. daily 10.  Brilinta 90 mg p.o. twice daily 11.Ixekizumab 80 mg every 28 days   30-35 minutes were spent completing discharge summary and discharge planning  Discharge Instructions    Ambulatory referral to Neurology   Complete by: As directed    An appointment is requested in approximately 4 weeks left thalamic, posterior corona radiata bilateral occipital punctate infarctions   Ambulatory referral to Physical Medicine Rehab   Complete by: As directed    Moderate complexity follow-up 1 to 2 weeks left thalamic posterior corona radiata bilateral occipital parietal infarctions       Follow-up Information    Kirsteins, Luanna Salk, MD Follow up.   Specialty: Physical Medicine and Rehabilitation Why: Office to call for appointment Contact information: [place] [address] [phone]        [name], MD Follow up.   Specialty: Cardiology Why: Call for appointment Contact information: [address] [address] [phone]               Signed: Cathlyn Parsons 10/28/2020, 5:12 AM

## 2020-10-27 NOTE — Progress Notes (Signed)
PROGRESS NOTE   Subjective/Complaints:  Pt getting up on his own to go to [facility]. Doesn't want to have to wait on help. Upset that he can't yet.   ROS: Patient denies fever, rash, sore throat, blurred vision, nausea, vomiting, diarrhea, cough, shortness of breath or chest pain, joint or back pain, headache, or mood change.    Objective:   No results found. No results for input(s): WBC, HGB, HCT, PLT in the last 72 hours. No results for input(s): NA, K, CL, CO2, GLUCOSE, BUN, CREATININE, CALCIUM in the last 72 hours.  Intake/Output Summary (Last 24 hours) at 10/27/2020 0922 Last data filed at 10/27/2020 0800 Gross per 24 hour  Intake 620 ml  Output --  Net 620 ml        Physical Exam: Vital Signs Blood pressure 119/74, pulse 83, temperature 98.2 F (36.8 C), temperature source Oral, resp. rate 20, height 5\' 8"  (1.727 m), weight 113.4 kg, SpO2 98 %.   Constitutional: No distress . Vital signs reviewed. HEENT: EOMI, oral membranes moist Neck: supple Cardiovascular: RRR without murmur. No JVD    Respiratory/Chest: CTA Bilaterally without wheezes or rales. Normal effort    GI/Abdomen: BS +, non-tender, non-distended Ext: no clubbing, cyanosis, or edema Psych: pleasant and cooperative Skin: No evidence of breakdown, no evidence of rash, Loop site CDI Neurologic: Cranial nerves II through XII intact, motor strength is 5/5 in bilateral deltoid, bicep, tricep, grip, hip flexor, knee extensors, ankle dorsiflexor and plantar flexor Normal sensory exam.  Musculoskeletal: Full ROM, No pain with AROM or PROM in the neck, trunk, or extremities. Posture appropriate   Assessment/Plan: 1. Functional deficits which require 3+ hours per day of interdisciplinary therapy in a comprehensive inpatient rehab setting.  Physiatrist is providing close team supervision and 24 hour management of active medical problems listed below.  Physiatrist  and rehab team continue to assess barriers to discharge/monitor patient progress toward functional and medical goals  Care Tool:  Bathing    Body parts bathed by patient: Right arm,Left arm,Chest,Abdomen,Buttocks,Left upper leg,Right upper leg,Right lower leg,Left lower leg,Front perineal area,Face         Bathing assist Assist Level: Supervision/Verbal cueing     Upper Body Dressing/Undressing Upper body dressing   What is the patient wearing?: Pull over shirt    Upper body assist Assist Level: Minimal Assistance - Patient > 75%    Lower Body Dressing/Undressing Lower body dressing      What is the patient wearing?: Pants,Underwear/pull up     Lower body assist Assist for lower body dressing: Supervision/Verbal cueing     Toileting Toileting    Toileting assist Assist for toileting: Contact Guard/Touching assist     Transfers Chair/bed transfer  Transfers assist     Chair/bed transfer assist level: Supervision/Verbal cueing     Locomotion Ambulation   Ambulation assist      Assist level: Supervision/Verbal cueing Assistive device: Walker-rolling Max distance: 150   Walk 10 feet activity   Assist     Assist level: Supervision/Verbal cueing Assistive device: Walker-rolling   Walk 50 feet activity   Assist    Assist level: Supervision/Verbal cueing Assistive device: Walker-rolling  Walk 150 feet activity   Assist    Assist level: Supervision/Verbal cueing Assistive device: Walker-rolling    Walk 10 feet on uneven surface  activity   Assist     Assist level: Minimal Assistance - Patient > 75%     Wheelchair     Assist Will patient use wheelchair at discharge?: No   Wheelchair activity did not occur: N/A         Wheelchair 50 feet with 2 turns activity    Assist    Wheelchair 50 feet with 2 turns activity did not occur: N/A       Wheelchair 150 feet activity     Assist  Wheelchair 150 feet  activity did not occur: N/A       Blood pressure 119/74, pulse 83, temperature 98.2 F (36.8 C), temperature source Oral, resp. rate 20, height 5\' 8"  (1.727 m), weight 113.4 kg, SpO2 98 %.  Medical Problem List and Plan: 1.  Abnormal gait pattern with gait balance difficulty secondary to left thalamic and posterior CR, bilateral occipital punctate, right cerebellum small infarcts, embolic pattern, cryptogenic origin             -patient may shower             -ELOS/Goals: ?10/28/20, Mod I goals with PT, OT  Pt qualified for FMLA and STD, too early to assess LTD  -make Mod I in room? 2.  Antithrombotics: -DVT/anticoagulation: SCDs             -antiplatelet therapy: Aspirin 81 mg daily and Brilinta 90 mg twice daily 3. Pain Management: Tylenol as needed 4. Mood: Valium 5 mg daily as needed anxiety             -antipsychotic agents: N/A 5. Neuropsych: This patient is capable of making decisions on his own behalf. 6. Skin/Wound Care: Routine skin checks 7. Fluids/Electrolytes/Nutrition: Routine in and outs with follow-up chemistries             -pt requested dietary consult for healthier eating habits 8.  Hypertension.  Cozaar 12.5 mg daily.     4/11 bp's controlled Vitals:   10/26/20 2050 10/27/20 0433  BP: 122/69 119/74  Pulse: 87 83  Resp: 20 20  Temp: 98.7 F (37.1 C) 98.2 F (36.8 C)  SpO2: 97% 98%   9.  Diabetes mellitus with peripheral neuropathy.  Hemoglobin A1c 8.7.  Lantus insulin 40 units daily.       CBG (last 3)  Recent Labs    10/26/20 1609 10/26/20 2131 10/27/20 0612  GLUCAP 154* 180* 144*  goal is 100-180, increased lantus to 43 U --observe 4/11  10.  Hyperlipidemia.  Pravachol- will stop due to muscle soreness 11.  History of CAD with STEMI, continue aspirin 12.  Obesity.  BMI 36.32.  Dietary follow-up 13.  Chronic systolic congestive heart failure.  Monitor for signs of fluid overload.             -no weights checked since [facility] Weights    10/21/20 1822  Weight: 113.4 kg    14.  Tobacco abuse.  Counseling 15.  History of colon cancer.  Follow-up outpatient follow-up Dr. [name]  16.  Hypo K+ mild. continue KCL 53meq daily  LOS: 6 days A FACE TO FACE EVALUATION WAS PERFORMED  Meredith Staggers 10/27/2020, 9:22 AM

## 2020-10-27 NOTE — Plan of Care (Signed)
  Problem: RH Balance Goal: LTG: Patient will maintain dynamic sitting balance (OT) Description: LTG:  Patient will maintain dynamic sitting balance with assistance during activities of daily living (OT) Outcome: Completed/Met Goal: LTG Patient will maintain dynamic standing with ADLs (OT) Description: LTG:  Patient will maintain dynamic standing balance with assist during activities of daily living (OT)  Outcome: Completed/Met   Problem: Sit to Stand Goal: LTG:  Patient will perform sit to stand in prep for activites of daily living with assistance level (OT) Description: LTG:  Patient will perform sit to stand in prep for activites of daily living with assistance level (OT) Outcome: Completed/Met   Problem: RH Grooming Goal: LTG Patient will perform grooming w/assist,cues/equip (OT) Description: LTG: Patient will perform grooming with assist, with/without cues using equipment (OT) Outcome: Completed/Met   Problem: RH Bathing Goal: LTG Patient will bathe all body parts with assist levels (OT) Description: LTG: Patient will bathe all body parts with assist levels (OT) Outcome: Completed/Met   Problem: RH Dressing Goal: LTG Patient will perform upper body dressing (OT) Description: LTG Patient will perform upper body dressing with assist, with/without cues (OT). Outcome: Completed/Met Goal: LTG Patient will perform lower body dressing w/assist (OT) Description: LTG: Patient will perform lower body dressing with assist, with/without cues in positioning using equipment (OT) Outcome: Completed/Met   Problem: RH Toileting Goal: LTG Patient will perform toileting task (3/3 steps) with assistance level (OT) Description: LTG: Patient will perform toileting task (3/3 steps) with assistance level (OT)  Outcome: Completed/Met   Problem: RH Functional Use of Upper Extremity Goal: LTG Patient will use RT/LT upper extremity as a (OT) Description: LTG: Patient will use right/left upper  extremity as a stabilizer/gross assist/diminished/nondominant/dominant level with assist, with/without cues during functional activity (OT) Outcome: Completed/Met   Problem: RH Simple Meal Prep Goal: LTG Patient will perform simple meal prep w/assist (OT) Description: LTG: Patient will perform simple meal prep with assistance, with/without cues (OT). Outcome: Completed/Met   Problem: RH Light Housekeeping Goal: LTG Patient will perform light housekeeping w/assist (OT) Description: LTG: Patient will perform light housekeeping with assistance, with/without cues (OT). Outcome: Completed/Met   Problem: RH Toilet Transfers Goal: LTG Patient will perform toilet transfers w/assist (OT) Description: LTG: Patient will perform toilet transfers with assist, with/without cues using equipment (OT) Outcome: Completed/Met   Problem: RH Tub/Shower Transfers Goal: LTG Patient will perform tub/shower transfers w/assist (OT) Description: LTG: Patient will perform tub/shower transfers with assist, with/without cues using equipment (OT) Outcome: Completed/Met   Problem: RH Furniture Transfers Goal: LTG Patient will perform furniture transfers w/assist (OT/PT) Description: LTG: Patient will perform furniture transfers  with assistance (OT/PT). Outcome: Completed/Met   Problem: RH Furniture Transfers Goal: LTG Patient will perform furniture transfers w/assist (OT/PT) Description: LTG: Patient will perform furniture transfers  with assistance (OT/PT). Outcome: Completed/Met

## 2020-10-27 NOTE — Progress Notes (Signed)
Occupational Therapy Discharge Summary  Patient Details  Name: [name] MRN: [id number] Date of Birth: Feb 05, 1961   Patient has met 58 of 15 long term goals due to improved activity tolerance, improved balance, postural control, ability to compensate for deficits, functional use of  RIGHT upper extremity and improved coordination.  Patient to discharge at overall Modified Independent level with use of SPC for mobility/functional transfers and use of shower seat and grab bars. Pt reports already having necessary equipment at home. Patient's care partner NA as pt lives alone to provide the necessary physical assistance at discharge.    Reasons goals not met: NA  Recommendation:  No follow-up recommended   Equipment: No equipment provided  Reasons for discharge: treatment goals met and discharge from hospital  Patient/family agrees with progress made and goals achieved: Yes  OT Discharge Precautions/Restrictions  Precautions Precautions: Fall Precaution Comments: loop recorder Restrictions Weight Bearing Restrictions: No ADL ADL Eating: Independent Where Assessed-Eating: Chair,Edge of bed Grooming: Modified independent Where Assessed-Grooming: Sitting at sink,Edge of bed,Standing at sink Upper Body Bathing: Modified independent Where Assessed-Upper Body Bathing: Shower (seated) Lower Body Bathing: Modified independent Where Assessed-Lower Body Bathing: Shower (seated) Upper Body Dressing: Independent Where Assessed-Upper Body Dressing: Edge of bed Lower Body Dressing: Modified independent Where Assessed-Lower Body Dressing: Edge of bed,Chair Toileting: Modified independent (grab bar) Where Assessed-Toileting: Glass blower/designer: Diplomatic Services operational officer Method: Counselling psychologist: Energy manager: Modified independent Clinical cytogeneticist Method: Optometrist: Civil engineer, contracting with back,Grab Solicitor: Modified independent Social research officer, government Method: Heritage manager: Grab bars,Shower seat with back Vision Baseline Vision/History: Wears glasses Wears Glasses: At all times Patient Visual Report: No change from baseline Vision Assessment?: No apparent visual deficits Perception  Perception: Within Functional Limits Praxis Praxis: Intact Cognition Overall Cognitive Status: Within Functional Limits for tasks assessed Arousal/Alertness: Awake/alert Orientation Level: Oriented X4 Attention: Sustained Sustained Attention: Appears intact Memory: Appears intact Problem Solving: Appears intact Safety/Judgment: Appears intact Sensation Sensation Light Touch: Appears Intact Hot/Cold: Not tested Proprioception: Appears Intact Stereognosis: Appears Intact Coordination Gross Motor Movements are Fluid and Coordinated: Yes Fine Motor Movements are Fluid and Coordinated: Yes Coordination and Movement Description: mild R hemiplegia RLE>RUE Motor  Motor Motor: Hemiplegia Motor - Discharge Observations: mild R hemiplegia RLE>RUE Mobility  Bed Mobility Bed Mobility: Sit to Supine;Supine to Sit Supine to Sit: Independent Sit to Supine: Independent Transfers Sit to Stand: Independent with assistive device Stand to Sit: Independent with assistive device  Trunk/Postural Assessment  Cervical Assessment Cervical Assessment: Exceptions to [facility] (forward head) Thoracic Assessment Thoracic Assessment: Exceptions to [facility] (rounded shoulders) Lumbar Assessment Lumbar Assessment: Exceptions to [facility] (posterior pelvic tilt) Postural Control Postural Control: Within Functional Limits  Balance Balance Balance Assessed: Yes Static Sitting Balance Static Sitting - Balance Support: No upper extremity supported Static Sitting - Level of Assistance: 7: Independent Dynamic Sitting Balance Dynamic Sitting - Balance Support: No upper extremity supported Dynamic  Sitting - Level of Assistance: 7: Independent Static Standing Balance Static Standing - Balance Support: Right upper extremity supported Static Standing - Level of Assistance: 6: Modified independent (Device/Increase time) Dynamic Standing Balance Dynamic Standing - Balance Support: Right upper extremity supported Dynamic Standing - Level of Assistance: 6: Modified independent (Device/Increase time) Extremity/Trunk Assessment RUE Assessment RUE Assessment: Within Functional Limits Active Range of Motion (AROM) Comments: 3/4 to full shoulder flexion General Strength Comments: 5/5 in shoulder flexion, 58 lbs grip strength, mild dysmetria - WFL for ADL LUE  Assessment LUE Assessment: Within Functional Limits Active Range of Motion (AROM) Comments: 3/4 to full shoulder flexion General Strength Comments: 5/5 in shoulder flexion, 60 lbs grip strength   Volanda Napoleon MS, OTR/L  10/27/2020, 8:56 PM

## 2020-10-27 NOTE — Progress Notes (Signed)
Occupational Therapy Session Note  Patient Details  Name: [name] MRN: [id number] Date of Birth: 1961/04/30  Today's Date: 10/27/2020 OT Individual Time: 0930-1000 OT Individual Time Calculation (min): 30 min    Short Term Goals: Week 1:  OT Short Term Goal 1 (Week 1): STG = LTG 2/2 ELOS  Skilled Therapeutic Interventions/Progress Updates:    Pt sitting in recliner with no c/o pain. Pt declined shower d/t time constraints of 30 min session. Pt completed 500+ ft of functional mobility progressing from supervision to mod I. Pt cued on more natural arm swing. He completed therapeutic activity focused on challenging dynamic standing balance, overall functional activity tolerance, and global strengthening. Pt then completed standing level hip adduction with level 1 resistive band. Pt required supervision during higher level activities. Discussed safety in the home and need to carry a cell phone for safety in the event of a fall. Discussed making pt mod I in the room with PT. Pt opted to return to supine at close of session.   Therapy Documentation Precautions:  Precautions Precautions: Fall Precaution Comments: loop recorder Restrictions Weight Bearing Restrictions: No  Therapy/Group: Individual Therapy  Curtis Sites 10/27/2020, 6:18 AM

## 2020-10-27 NOTE — Progress Notes (Signed)
Physical Therapy Discharge Summary  Patient Details  Name: [name] MRN: [id number] Date of Birth: [birth year]  Today's Date: 10/27/2020 PT Individual Time: 0093-8182 PT Individual Time Calculation (min): 58 min    Patient has met 9 of 9 long term goals due to improved activity tolerance, improved balance, increased strength and improved coordination.  Patient to discharge at an ambulatory level Modified Independent.   Patient's care partner is independent and able  to provide the necessary physical assistance at discharge as needed.  Reasons goals not met: n/a  Recommendation:  Patient will benefit from ongoing skilled PT services in home health setting to continue to advance safe functional mobility, address ongoing impairments in standing balance, coordination, strength, safety awareness, and to continue to minimize fall risk.  Equipment: [address] [facility]  Reasons for discharge: treatment goals met and discharge from hospital  Patient/family agrees with progress made and goals achieved: Yes  PT Discharge Precautions/Restrictions  Precautions Precautions: Fall Precaution Comments: loop recorder Restrictions Weight Bearing Restrictions: No Vision Baseline Vision/History: Wears glasses Wears Glasses: At all times Patient Visual Report: No change from baseline Vision Assessment?: No apparent visual deficits Perception  Perception: Within Functional Limits Praxis Praxis: Intact Cognition Overall Cognitive Status: Within Functional Limits for tasks assessed Arousal/Alertness: Awake/alert Orientation Level: Oriented X4 Attention: Sustained Sustained Attention: Appears intact Memory: Appears intact Problem Solving: Appears intact Safety/Judgment: Appears intact Sensation Sensation Light Touch: Appears Intact Coordination Gross Motor Movements are Fluid and Coordinated: Yes Fine Motor Movements are Fluid and Coordinated: Yes Coordination and Movement Description: improved mild  R hemiplegia RLE>RUE Heel Shin Test: continued mild decreased speed on the R vs L. Motor  Motor Motor: Hemiplegia Motor - Discharge Observations: mild R hemiplegia RLE>RUE Mobility  Bed Mobility Bed Mobility: Sit to Supine;Supine to Sit Supine to Sit: Independent Sit to Supine: Independent Transfers Sit to Stand: Independent with assistive device Stand to Sit: Independent with assistive device  Stand Pivot Transfers: Independent with assistive device Locomotion  Ambulation: yes Gait Assistance: Independent with assistive device Gait Assistance (Feet): >300 feet Assistaive Device: SPC Gait Gait: Yes Gait Pattern: step through Gait Velocity: decreased for age High level ambulation: lateral stepping with supervision/ mod I with SPC Stairs Stairs: Yes Stair management Technique: One rail (prn) Number of Stairs: 12 Height of Stairs (inches): 6 Ramp: Mod I with SPC Curb: Supervision/ Mod I with SPC Trunk/Postural Assessment  Cervical Assessment Cervical Assessment: Exceptions to [facility] (forward head) Thoracic Assessment Thoracic Assessment: Exceptions to [facility] (rounded shoulders) Lumbar Assessment Lumbar Assessment: Exceptions to [facility] (posterior pelvic tilt) Postural Control Postural Control: Within Functional Limits  Balance Balance Balance Assessed: Yes Standardized Balance Assessment Standardized Balance Assessment: Berg Balance Test Berg Balance Test Sit to Stand: Able to stand without using hands and stabilize independently Standing Unsupported: Able to stand safely 2 minutes Sitting with Back Unsupported but Feet Supported on Floor or Stool: Able to sit safely and securely 2 minutes Stand to Sit: Sits safely with minimal use of hands Transfers: Able to transfer safely, minor need of hands Standing Unsupported with Eyes Closed: Able to stand 10 seconds safely Standing Ubsupported with Feet Together: Able to place feet together independently and stand for 1 minute with  supervision From Standing, Reach Forward with Outstretched Arm: Can reach forward >12 cm safely (5") From Standing Position, Pick up Object from Floor: Able to pick up shoe, needs supervision From Standing Position, Turn to Look Behind Over each Shoulder: Looks behind one side only/other side shows less weight shift Turn  360 Degrees: Able to turn 360 degrees safely but slowly Standing Unsupported, Alternately Place Feet on Step/Stool: Able to stand independently and complete 8 steps >20 seconds Standing Unsupported, One Foot in Front: Able to take small step independently and hold 30 seconds Standing on One Leg: Tries to lift leg/unable to hold 3 seconds but remains standing independently Total Score: 44 Dynamic Gait Index Level Surface: Normal Change in Gait Speed: Normal Gait with Horizontal Head Turns: Mild Impairment Gait with Vertical Head Turns: Mild Impairment Gait and Pivot Turn: Mild Impairment Step Over Obstacle: Mild Impairment Step Around Obstacles: Normal Steps: Mild Impairment Total Score: 19 Static Sitting Balance Static Sitting - Balance Support: No upper extremity supported Static Sitting - Level of Assistance: 7: Independent Dynamic Sitting Balance Dynamic Sitting - Balance Support: No upper extremity supported Dynamic Sitting - Level of Assistance: 7: Independent Static Standing Balance Static Standing - Balance Support: Right upper extremity supported Static Standing - Level of Assistance: 6: Modified independent (Device/Increase time) Dynamic Standing - Balance Support: Right upper extremity supported;During functional activity Dynamic Standing - Level of Assistance: 6: Modified independent (Device/Increase time) Extremity Assessment  RLE Assessment RLE Assessment: Within Functional Limits General Strength Comments: mild decrease in coordination and activaiton LLE Assessment LLE Assessment: Within Functional Limits        Alger Simons 10/27/2020, 7:09  PM

## 2020-10-27 NOTE — Progress Notes (Signed)
Physical Therapy Session Note  Patient Details  Name: [name] MRN: [id number] Date of Birth: Dec 21, 1960  Today's Date: 10/28/2020 PT Individual Time: 0807-0904  PT Individual Time Calculation (min): 57 min   Short Term Goals: Week 1:  PT Short Term Goal 1 (Week 1): STG=:LTG due to ELOS  Skilled Therapeutic Interventions/Progress Updates:  Patient supine in bed upon PT arrival. Patient alert and agreeable to PT session. Requests to toilet and is able to demo with Mod I. Patient denied pain during session.  Therapeutic Activity: Transfers: Patient performed STS throughout session to/ from various seat heights and surfaces with supervision and improving to independent with no AD and no UE support. No vc for technique. SPVT transfers performed throughout session and improving from supervision to Mod I.   Gait Training:  Patient ambulated >300 feet x3 with no AD with supervision. Guided through DGI with significant improvement seen with increase in score to 19/ 22 from 13/ 22. PT continues to demo difficulty with horizontal head turns and maintaining perfectly straight path but deviations have improved. Pt continues to desire to hold [facility] in L hand in the event that he thinks he needs it for improved balance.   Pt is able to negotiate steps requiring consistent BUE support to ascend/ descend 4 steps. On second bout, he is able to perform with supervision and intermittent BUE support for safety.   Neuromuscular Re-ed: NMR facilitated during session with focus on standing dynamic balance. Pt guided in aspects of Berg balance with pt requiring encouragement to try more dynamic aspects without UE support. . NMR performed for improvements in motor control and coordination, balance, sequencing, judgement, and self confidence/ efficacy in performing all aspects of mobility at highest level of independence.   Therapeutic Exercise: Patient guided in seated stretches for low back using small  theraball on mat table. Demonstrated how to perform at home in order assist with LBP mgmt.   Patient seated in recliner at end of session with brakes locked, seat alarm set, and all needs within reach.  Discussion with MD on duty for expected d/c date as last known discussed date is Tuesday 4/12. MD in agreement and pt notified by MD and social worker. Disc with OT, MD re: Mod I status in room, and pt upgraded to Mod I in room with use of SPC for all OOB mobility. Pt in agreement.    Therapy Documentation Precautions:  Precautions Precautions: Fall Precaution Comments: loop recorder Restrictions Weight Bearing Restrictions: No  Therapy/Group: Individual Therapy  Alger Simons 10/27/2020, 8:31 AM

## 2020-10-27 NOTE — Progress Notes (Signed)
Patient ID: [name], male   DOB: 11-09-1960, 60 y.o.   MRN: [id number]   Pt set to d/c tomorrow 10/28/2020 per MD note. Single point cane ordered through [employer]. Pt reports he will not have a ride to OP therapy. Nor does he want anyone coming inside his home for [facility]. SW will send pt [facility] referral out and allow pt to decline inside the home. Pt reports not having a ride for d/c tomorrow, sw will arrange transportation through [facility] for pt on tomorrow 4/12.   [place], [place]

## 2020-10-28 LAB — GLUCOSE, CAPILLARY
Glucose-Capillary: 208 mg/dL — ABNORMAL HIGH (ref 70–99)
Glucose-Capillary: 146 mg/dL — ABNORMAL HIGH (ref 70–99)

## 2020-10-28 MED ORDER — MELATONIN 5 MG PO TABS: 5.0000 mg | ORAL_TABLET | Freq: Every day | ORAL | 0 refills | Status: DC

## 2020-10-28 MED ORDER — FOLIC ACID 1 MG PO TABS: 1.0000 mg | ORAL_TABLET | Freq: Every day | ORAL | 0 refills | Status: DC

## 2020-10-28 MED ORDER — LOSARTAN POTASSIUM 25 MG PO TABS: 12.5000 mg | ORAL_TABLET | Freq: Every day | ORAL | 0 refills | Status: DC

## 2020-10-28 MED ORDER — TICAGRELOR 90 MG PO TABS: ORAL_TABLET | ORAL | 0 refills | Status: DC

## 2020-10-28 MED ORDER — POTASSIUM CHLORIDE CRYS ER 10 MEQ PO TBCR: 10.0000 meq | EXTENDED_RELEASE_TABLET | Freq: Every day | ORAL | 0 refills | Status: DC

## 2020-10-28 MED ORDER — METOPROLOL SUCCINATE ER 25 MG PO TB24: 25.0000 mg | ORAL_TABLET | Freq: Every day | ORAL | 0 refills | Status: DC

## 2020-10-28 MED ORDER — ASPIRIN EC 81 MG PO TBEC: 81.0000 mg | DELAYED_RELEASE_TABLET | Freq: Every day | ORAL | 3 refills | Status: DC

## 2020-10-28 NOTE — Progress Notes (Signed)
Patient ID: [name], male   DOB: [birth year], 60 y.o.   MRN: [id number]   Pt single point cane set to be delivered to pt's home.   Uncertain, [place]

## 2020-10-28 NOTE — Plan of Care (Signed)
  Problem: RH Balance Goal: LTG Patient will maintain dynamic standing balance (PT) Description: LTG:  Patient will maintain dynamic standing balance with assistance during mobility activities (PT) Outcome: Completed/Met Flowsheets (Taken 10/27/2020 1734) LTG: Pt will maintain dynamic standing balance during mobility activities with:: Independent with assistive device    Problem: Sit to Stand Goal: LTG:  Patient will perform sit to stand with assistance level (PT) Description: LTG:  Patient will perform sit to stand with assistance level (PT) Outcome: Completed/Met Flowsheets (Taken 10/27/2020 1734) LTG: PT will perform sit to stand in preparation for functional mobility with assistance level: Independent   Problem: RH Bed Mobility Goal: LTG Patient will perform bed mobility with assist (PT) Description: LTG: Patient will perform bed mobility with assistance, with/without cues (PT). Outcome: Completed/Met Flowsheets (Taken 10/27/2020 1734) LTG: Pt will perform bed mobility with assistance level of: Independent   Problem: RH Bed to Chair Transfers Goal: LTG Patient will perform bed/chair transfers w/assist (PT) Description: LTG: Patient will perform bed to chair transfers with assistance (PT). Outcome: Completed/Met Flowsheets (Taken 10/27/2020 1734) LTG: Pt will perform Bed to Chair Transfers with assistance level: Independent   Problem: RH Car Transfers Goal: LTG Patient will perform car transfers with assist (PT) Description: LTG: Patient will perform car transfers with assistance (PT). Outcome: Completed/Met Flowsheets (Taken 10/27/2020 1734) LTG: Pt will perform car transfers with assist:: Set up assist    Problem: RH Ambulation Goal: LTG Patient will ambulate in controlled environment (PT) Description: LTG: Patient will ambulate in a controlled environment, # of feet with assistance (PT). Outcome: Completed/Met Flowsheets (Taken 10/27/2020 1734) LTG: Pt will ambulate in  controlled environ  assist needed:: Independent with assistive device LTG: Ambulation distance in controlled environment: >300 ft with SPC Goal: LTG Patient will ambulate in home environment (PT) Description: LTG: Patient will ambulate in home environment, # of feet with assistance (PT). Outcome: Completed/Met Flowsheets Taken 10/27/2020 1734 by Alger Simons, PT LTG: Pt will ambulate in home environ  assist needed:: Independent with assistive device Taken 10/22/2020 1503 by Lorie Phenix, PT LTG: Ambulation distance in home environment: 73ft with LRAD   Problem: RH Stairs Goal: LTG Patient will ambulate up and down stairs w/assist (PT) Description: LTG: Patient will ambulate up and down # of stairs with assistance (PT) Outcome: Completed/Met Flowsheets Taken 10/27/2020 1734 by Alger Simons, PT LTG: Pt will ambulate up/down stairs assist needed:: Independent with assistive device Taken 10/22/2020 1503 by Lorie Phenix, PT LTG: Pt will  ambulate up and down number of stairs: 1 step into house with LRAD Note: Completes eight 6" steps with intermittent use of HR at mod I level.

## 2020-10-28 NOTE — Progress Notes (Signed)
PROGRESS NOTE   Subjective/Complaints:  Per OT Mod I in room   ROS: Patient denies CP, SOB, N/V/D Objective:   No results found. No results for input(s): WBC, HGB, HCT, PLT in the last 72 hours. No results for input(s): NA, K, CL, CO2, GLUCOSE, BUN, CREATININE, CALCIUM in the last 72 hours.  Intake/Output Summary (Last 24 hours) at 10/28/2020 0748 Last data filed at 10/27/2020 1300 Gross per 24 hour  Intake 620 ml  Output --  Net 620 ml        Physical Exam: Vital Signs Blood pressure 116/72, pulse 80, temperature 97.6 F (36.4 C), resp. rate 17, height 5\' 8"  (1.727 m), weight 113.4 kg, SpO2 96 %.    General: No acute distress Mood and affect are appropriate Heart: Regular rate and rhythm no rubs murmurs or extra sounds Lungs: Clear to auscultation, breathing unlabored, no rales or wheezes Abdomen: Positive bowel sounds, soft nontender to palpation, nondistended Extremities: No clubbing, cyanosis, or edema Skin: No evidence of breakdown, no evidence of rash Neurologic: Cranial nerves II through XII intact, motor strength is 5/5 in bilateral deltoid, bicep, tricep, grip, hip flexor, knee extensors, ankle dorsiflexor and plantar flexor  Cerebellar exam normal finger to nose to finger as well as heel to shin in bilateral upper and lower extremities Musculoskeletal: Full range of motion in all 4 extremities. No joint swelling   Assessment/Plan: 1. Functional deficits due to CVA Stable for D/C today F/u PCP in 3-4 weeks F/u PM&R 2 weeks See D/C summary See D/C instructions Care Tool:  Bathing    Body parts bathed by patient: Right arm,Left arm,Chest,Abdomen,Buttocks,Left upper leg,Right upper leg,Right lower leg,Left lower leg,Front perineal area,Face         Bathing assist Assist Level: Supervision/Verbal cueing     Upper Body Dressing/Undressing Upper body dressing   What is the patient wearing?: Pull  over shirt    Upper body assist Assist Level: Minimal Assistance - Patient > 75%    Lower Body Dressing/Undressing Lower body dressing      What is the patient wearing?: Pants,Underwear/pull up     Lower body assist Assist for lower body dressing: Supervision/Verbal cueing     Toileting Toileting    Toileting assist Assist for toileting: Contact Guard/Touching assist     Transfers Chair/bed transfer  Transfers assist     Chair/bed transfer assist level: Independent with assistive device     Locomotion Ambulation   Ambulation assist      Assist level: Independent with assistive device Assistive device: Cane-straight Max distance: >300 ft   Walk 10 feet activity   Assist     Assist level: Independent Assistive device: No Device   Walk 50 feet activity   Assist    Assist level: Independent with assistive device Assistive device: Cane-straight    Walk 150 feet activity   Assist    Assist level: Independent with assistive device Assistive device: Cane-straight    Walk 10 feet on uneven surface  activity   Assist     Assist level: Supervision/Verbal cueing Assistive device: Cane-straight   Wheelchair     Assist Will patient use wheelchair at discharge?: No  Wheelchair activity did not occur: N/A         Wheelchair 50 feet with 2 turns activity    Assist    Wheelchair 50 feet with 2 turns activity did not occur: N/A       Wheelchair 150 feet activity     Assist  Wheelchair 150 feet activity did not occur: N/A       Blood pressure 116/72, pulse 80, temperature 97.6 F (36.4 C), resp. rate 17, height 5\' 8"  (1.727 m), weight 113.4 kg, SpO2 96 %.  Medical Problem List and Plan: 1.  Abnormal gait pattern with gait balance difficulty secondary to left thalamic and posterior CR, bilateral occipital punctate, right cerebellum small infarcts, embolic pattern, cryptogenic origin             -patient may shower              -ELOS/Goals: ?10/28/20, Mod I goals with PT, OT  Pt qualified for FMLA and STD, too early to assess LTD No driving until f/u appt   -make Mod I in room? 2.  Antithrombotics: -DVT/anticoagulation: SCDs             -antiplatelet therapy: Aspirin 81 mg daily and Brilinta 90 mg twice daily 3. Pain Management: Tylenol as needed 4. Mood: Valium 5 mg daily as needed anxiety             -antipsychotic agents: N/A 5. Neuropsych: This patient is capable of making decisions on his own behalf. 6. Skin/Wound Care: Routine skin checks 7. Fluids/Electrolytes/Nutrition: Routine in and outs with follow-up chemistries             -pt requested dietary consult for healthier eating habits 8.  Hypertension.  Cozaar 12.5 mg daily.     4/11 bp's controlled Vitals:   10/27/20 1927 10/28/20 0547  BP: 121/75 116/72  Pulse: 90 80  Resp: 17 17  Temp: 98.6 F (37 C) 97.6 F (36.4 C)  SpO2: 97% 96%   9.  Diabetes mellitus with peripheral neuropathy.  Hemoglobin A1c 8.7.  Lantus insulin 40 units daily.       CBG (last 3)  Recent Labs    10/27/20 0612 10/27/20 1130 10/28/20 0547  GLUCAP 144* 131* 146*  controlled 4/12  10.  Hyperlipidemia.  Pravachol- will stop due to muscle soreness 11.  History of CAD with STEMI, continue aspirin 12.  Obesity.  BMI 36.32.  Dietary follow-up 13.  Chronic systolic congestive heart failure.  Monitor for signs of fluid overload.             -no weights checked since admit [facility] Weights   10/21/20 1822  Weight: 113.4 kg    14.  Tobacco abuse.  Counseling 15.  History of colon cancer.  Follow-up outpatient follow-up Dr. [name]  16.  Hypo K+ mild. continue KCL 46meq daily  LOS: 7 days A FACE TO FACE EVALUATION WAS PERFORMED  Charlett Blake 10/28/2020, 7:48 AM

## 2020-10-28 NOTE — Progress Notes (Signed)
Patient ID: [name], male   DOB: July 26, 1960, 60 y.o.   MRN: [id number]  Pt referral faxed to [place], [place]

## 2020-10-28 NOTE — Discharge Instructions (Signed)
Inpatient Rehab Discharge Instructions  [name] Discharge date and time: No discharge date for patient encounter.   Activities/Precautions/ Functional Status: Activity: activity as tolerated Diet: diabetic diet Wound Care: Routine skin checks Functional status:  ___ No restrictions     ___ Walk up steps independently ___ 24/7 supervision/assistance   ___ Walk up steps with assistance ___ Intermittent supervision/assistance  ___ Bathe/dress independently ___ Walk with walker     _x__ Bathe/dress with assistance ___ Walk Independently    ___ Shower independently ___ Walk with assistance    ___ Shower with assistance ___ No alcohol     ___ Return to work/school ________  COMMUNITY REFERRALS UPON DISCHARGE:    Home Health:   PT     OT                        Agency: TBD (Upon insurance approval) Phone:    Medical Equipment/Items Ordered: Corporate investment banker                                                 Agency/Supplier: [employer]    Special Instructions: No driving smoking or alcohol STROKE/TIA DISCHARGE INSTRUCTIONS SMOKING Cigarette smoking nearly doubles your risk of having a stroke & is the single most alterable risk factor  If you smoke or have smoked in the last 12 months, you are advised to quit smoking for your health.  Most of the excess cardiovascular risk related to smoking disappears within a year of stopping.  Ask you doctor about anti-smoking medications  [place] Quit Line: 1-800-QUIT NOW  Free Smoking Cessation Classes [phone]  CHOLESTEROL Know your levels; limit fat & cholesterol in your diet  Lipid Panel     Component Value Date/Time   CHOL 196 10/19/2020 0116   CHOL 949 (H) 10/27/2014 1322   TRIG 254 (H) 10/19/2020 0116   TRIG 1,098 (H) 10/30/2014 0605   HDL 27 (L) 10/19/2020 0116   HDL 20 (L) 10/27/2014 1322   CHOLHDL 7.3 10/19/2020 0116   VLDL 51 (H) 10/19/2020 0116   VLDL SEE COMMENT 10/27/2014 1322   LDLCALC 118 (H) 10/19/2020  0116   LDLCALC SEE COMMENT 10/27/2014 1322      Many patients benefit from treatment even if their cholesterol is at goal.  Goal: Total Cholesterol (CHOL) less than 160  Goal:  Triglycerides (TRIG) less than 150  Goal:  HDL greater than 40  Goal:  LDL (LDLCALC) less than 100   BLOOD PRESSURE American Stroke Association blood pressure target is less that 120/80 mm/Hg  Your discharge blood pressure is:  BP: 117/60  Monitor your blood pressure  Limit your salt and alcohol intake  Many individuals will require more than one medication for high blood pressure  DIABETES (A1c is a blood sugar average for last 3 months) Goal HGBA1c is under 7% (HBGA1c is blood sugar average for last 3 months)  Diabetes:    Lab Results  Component Value Date   HGBA1C 8.7 (H) 10/19/2020     Your HGBA1c can be lowered with medications, healthy diet, and exercise.  Check your blood sugar as directed by your physician  Call your physician if you experience unexplained or low blood sugars.  PHYSICAL ACTIVITY/REHABILITATION Goal is 30 minutes at least 4 days per week  Activity: Increase activity  slowly, Therapies: Physical Therapy: Home Health Return to work:   Activity decreases your risk of heart attack and stroke and makes your heart stronger.  It helps control your weight and blood pressure; helps you relax and can improve your mood.  Participate in a regular exercise program.  Talk with your doctor about the best form of exercise for you (dancing, walking, swimming, cycling).  DIET/WEIGHT Goal is to maintain a healthy weight  Your discharge diet is:  Diet Order            Diet heart healthy/carb modified Room service appropriate? Yes; Fluid consistency: Thin  Diet effective now                 liquids Your height is:  Height: 5\' 8"  (172.7 cm) Your current weight is: Weight: 113.4 kg Your Body Mass Index (BMI) is:  BMI (Calculated): 38.02  Following the type of diet specifically designed  for you will help prevent another stroke.  Your goal weight range is:    Your goal Body Mass Index (BMI) is 19-24.  Healthy food habits can help reduce 3 risk factors for stroke:  High cholesterol, hypertension, and excess weight.  RESOURCES Stroke/Support Group:  Call [phone]   STROKE EDUCATION PROVIDED/REVIEWED AND GIVEN TO PATIENT Stroke warning signs and symptoms How to activate emergency medical system (call 911). Medications prescribed at discharge. Need for follow-up after discharge. Personal risk factors for stroke. Pneumonia vaccine given:  Flu vaccine given:  My questions have been answered, the writing is legible, and I understand these instructions.  I will adhere to these goals & educational materials that have been provided to me after my discharge from the hospital.      My questions have been answered and I understand these instructions. I will adhere to these goals and the provided educational materials after my discharge from the hospital.  Patient/Caregiver Signature _______________________________ Date __________  Clinician Signature _______________________________________ Date __________  Please bring this form and your medication list with you to all your follow-up doctor's appointments.

## 2020-10-28 NOTE — Progress Notes (Signed)
Inpatient Rehabilitation Care Coordinator Discharge Note  The overall goal for the admission was met for:   Discharge location: Yes, home  Length of Stay: Yes, 7 days   Discharge activity level: Yes, MOD I  Home/community participation: Yes  Services provided included: MD, RD, PT, OT, SLP, RN, CM, TR, Pharmacy, [place]: Private Insurance: [place] ([employer])  Choices offered to/list presented to:pt   Follow-up services arranged: Home Health: TBD   Comments (or additional information): PT OT  Rolling Walker (to be delivered to pt's home)  Patient/Family verbalized understanding of follow-up arrangements: Yes  Individual responsible for coordination of the follow-up plan: self, [phone]  Confirmed correct DME delivered: Dyanne Iha 10/28/2020    Dyanne Iha

## 2020-10-28 NOTE — Progress Notes (Signed)
Patient AOX4, respiration even and unlabored, V/S stables. Patient refused all medication. Patient provided discharge instruction. Patient transported by tech. Patient left with all belongings.

## 2020-10-30 ENCOUNTER — Telehealth: Payer: Self-pay | Admitting: *Deleted

## 2020-10-30 NOTE — Telephone Encounter (Signed)
Transitional Care call--MR Posey    1. Are you/is patient experiencing any problems since coming home? Are there any questions regarding any aspect of care? NO 2. Are there any questions regarding medications administration/dosing? Are meds being taken as prescribed? Patient should review meds with caller to confirm He reports that he has his medications.He was not amenable to answering more questions. He was very angry and agitated about having to talk with me and that he was being expected to come to a visit that he has no transportation.  When I asked about discussing this before he left the hospital he only said he told the doctor multiple times. I asked if he discussed with the social worked [place] and he said nobody asked him anything. 3. Have there been any falls? NO   4. Has Home Health been to the house and/or have they contacted you? If not, have you tried to contact them? Can we help you contact them? He said [place] talked to him before he left but has not had a call since. 5. Are bowels and bladder emptying properly? Are there any unexpected incontinence issues? If applicable, is patient following bowel/bladder programs?  See below 6. Any fevers, problems with breathing, unexpected pain? 7. Are there any skin problems or new areas of breakdown? 8. Has the patient/family member arranged specialty MD follow up (ie cardiology/neurology/renal/surgical/etc)?  Can we help arrange? 9. Does the patient need any other services or support that we can help arrange? 10. Are caregivers following through as expected in assisting the patient? 11. Has the patient quit smoking, drinking alcohol, or using drugs as recommended? Mr Lyon was very angry and was not open to more questioning about his condition. He said he was ok and that was all he would answer. He was staying alone relying on neighbors, said he had no family and no transportation. I made sure he had our number to call if he was not  going to come to the appointment with Dr Letta Pate and that his primary would need to assume rehab orders if he was not going to come. He is seeing his cardiologist tomorrow 10/31/20 and I told him we were in the same building on the first floor. He wanted to know why he couldn't see him tomorrow. I told him we were closed and he said "Of course you are", in an angry tone.  At this point I ended the call because he was not interested in speaking any longer. Appointment 11/11/20 Thursday 12:30 arrive by 12:00 to see Dr Letta Pate.  Alerted to packet mailed to his home with papers to fill out.  He was angry about that saying "how do you expect someone with brain damage to fill out papers. I explained if he arrives by 12 someone will help him with it. [place]

## 2020-10-31 ENCOUNTER — Ambulatory Visit (INDEPENDENT_AMBULATORY_CARE_PROVIDER_SITE_OTHER): Payer: Commercial Managed Care - PPO | Admitting: Student

## 2020-10-31 ENCOUNTER — Encounter: Payer: Self-pay | Admitting: Adult Health

## 2020-10-31 ENCOUNTER — Other Ambulatory Visit: Payer: Self-pay

## 2020-10-31 DIAGNOSIS — I639 Cerebral infarction, unspecified: Secondary | ICD-10-CM

## 2020-10-31 LAB — CUP PACEART INCLINIC DEVICE CHECK
Date Time Interrogation Session: 20220415111654
Implantable Pulse Generator Implant Date: 20220405

## 2020-10-31 NOTE — Progress Notes (Signed)
Patient ID: [name], male   DOB: [birth year], 60 y.o.   MRN: [id number] Pt accepted by [facility].  Orders sent  Darrol Angel [phone]

## 2020-10-31 NOTE — Progress Notes (Signed)
ILR wound check in clinic. Steri strips had previously fallen off per pt. Wound well healed. Home monitor transmitting nightly. No episodes. Questions answered. No episodes.

## 2020-11-03 NOTE — Telephone Encounter (Signed)
Review of epic - PMR Dr. Letta Pate agreed to complete

## 2020-11-10 ENCOUNTER — Ambulatory Visit (INDEPENDENT_AMBULATORY_CARE_PROVIDER_SITE_OTHER): Payer: Commercial Managed Care - PPO | Admitting: Adult Health

## 2020-11-10 ENCOUNTER — Encounter: Payer: Self-pay | Admitting: Adult Health

## 2020-11-10 ENCOUNTER — Other Ambulatory Visit: Payer: Self-pay

## 2020-11-10 VITALS — BP 125/72 | HR 93 | Ht 68.0 in | Wt 235.0 lb

## 2020-11-10 DIAGNOSIS — I639 Cerebral infarction, unspecified: Secondary | ICD-10-CM

## 2020-11-10 DIAGNOSIS — I69398 Other sequelae of cerebral infarction: Secondary | ICD-10-CM

## 2020-11-10 DIAGNOSIS — F064 Anxiety disorder due to known physiological condition: Secondary | ICD-10-CM | POA: Diagnosis not present

## 2020-11-10 DIAGNOSIS — R269 Unspecified abnormalities of gait and mobility: Secondary | ICD-10-CM | POA: Diagnosis not present

## 2020-11-10 MED ORDER — SERTRALINE HCL 25 MG PO TABS: 25.0000 mg | ORAL_TABLET | Freq: Every day | ORAL | 4 refills | Status: DC

## 2020-11-10 MED ORDER — EZETIMIBE 10 MG PO TABS: 10.0000 mg | ORAL_TABLET | Freq: Every day | ORAL | 5 refills | Status: DC

## 2020-11-10 NOTE — Patient Instructions (Addendum)
Continue aspirin 81 mg daily and Brilinta (ticagrelor) 90 mg bid  and start Zetia 90m daily  for secondary stroke prevention  Highly recommend participating in outpatient physical therapy   Loop recorder will continue to be monitored for possible atrial fibrillation  Recommend starting Zoloft 267mdaily for anxiety - please discuss further use and possible need of increase with Dr. BlCharlett Blake Continue to follow up with PCP regarding cholesterol, blood pressure and diabetes management  Maintain strict control of hypertension with blood pressure goal below 130/90, diabetes with hemoglobin A1c goal below 7% and cholesterol with LDL cholesterol (bad cholesterol) goal below 70 mg/dL.      Followup in the future with me in 3 months or call earlier if needed      [facility] I hope we have been able to provide you high quality care today.  You may receive a patient satisfaction survey over the next few weeks. We would appreciate your feedback and comments so that we may continue to improve ourselves and the health of our patients.   Exercise After Stroke Physical activity and exercise are important for stroke recovery. The best type of exercise for you will depend on how your stroke has affected you. Loss of physical ability after a stroke is called functional limitation. This can range from no functional limitation to severe functional limitation. Work with your stroke care providers to find a safe exercise program that fits your needs and ability. This will depend on your strengths, limitations, fitness, and interests. Questions to ask your health care provider Before you begin any exercise, ask your health care providers:  What exercises are safe for you.  How many times you should repeat each exercise.  How often you should do each exercise. What are the benefits of exercise after a stroke? Exercise after a stroke has many benefits.  It can:  Improve your heart and lung function.  Reduce fat in your blood (cholesterol).  Lower your blood pressure.  Improve your strength, endurance, balance, and coordination.  Give you confidence in your ability to be independent.  Reduce your risk for depression and anxiety.  Reduce the risk of another stroke.  Improve your quality of life. What exercises can improve my strength and balance? Strengthening exercises improve muscle strength and endurance. As your muscles become stronger, you may be able to move better and become more active and independent. You can use weights and elastic bands to do these exercises.  To strengthen your shoulder blade muscles: Lie on your back with your arms at your sides. Raise one arm toward the ceiling, keeping your elbow stiff. Try to raise your shoulder blade off the floor and hold that position for about 5 seconds.  To strengthen your shoulder and arm muscles: Lie on your back and hold one end of an elastic exercise band in each hand at your waist. The band will provide light resistance. Move one hand to your hip area, and raise your other arm sideways and upward until your hand is above your head. Keep your elbow as straight as you can.  To strengthen your upper arm muscles: Lie on your back with your arms at your sides. Place a rolled towel under one elbow. Lift up your hand toward your shoulder. Keep your elbow flat against the towel.  To improve hip balance and control: Lie flat on your back. Keep one leg flat on the floor, and bend the  other leg at the knee. Lift the foot of the bent leg off the ground and cross the foot over to rest on the outside of the straight leg. Cross the foot back and forth several times.  To improve hip strength and movement: Lie on your back with your knees bent and your feet flat on the floor. Lift your hips off the floor as high as you can. Hold the position for about 5 seconds. Lower your hips back to the  floor.  To improve hip and knee control: Lie on your back with your knees bent and your feet flat on the floor. Slide your leg out straight by sliding the tip of your heel along the floor. Keeping your heel on the floor, slide your leg back into the bent position.  To improve knee control for walking: Lie on your side with your knees slightly bent. Straighten the leg that is not on the floor. After the leg is straight, bend it at the knee toward your buttocks as far back as you can.  To improve balance while walking: Get down on the floor on your hands and knees. Distribute your weight evenly on both arms and legs. Rock back in a diagonal direction, shifting your weight back toward your right foot. Then rock forward toward your left hand. Switch sides and repeat.  To improve balance and knee control: Stand up straight and use the edge of a table or chair for support. Take the weight off one leg by bending the knee. With your weight on the other leg, bend and straighten the knee. Repeat several times, bending and straightening your knee with most of your weight on your leg.  To improve balance and hip strength: Stand facing a chair or table for support with your legs slightly apart. Shift your weight to one leg while you lift the other leg sideways off the floor, keeping your knee as straight as possible. Shift your weight to the other side and lift the other leg off the floor. How much aerobic exercise should I get? Aerobic exercise is activity that increases your heart rate and breathing rate, which in turn pumps more blood and oxygen throughout your body. Aerobic exercise can:  Increase your energy and stamina.  Strengthen your heart and lungs.  Lower your blood pressure. For most people, the recommendation is 30-45 minutes of aerobic activity on most days of the week. If you are recovering from a stroke:  Ask your stroke care provider how much aerobic activity is safe for you.  Start any  exercise slowly and gradually increase your activity over time. Examples of aerobic exercise  Walking indoors or outdoors.  Walking on a treadmill.  Biking.  Riding a stationary bike.  Swimming.  Exercising or walking in a pool.  Doing housework or yard work.  Dancing.  Taking a yoga or tai chi class.      Follow these instructions at home:  Practice your exercises with your stroke care provider before starting them.  When you start exercising, have someone with you who can help if you get tired, lose your balance, or have any trouble.  Wear loose, comfortable clothing and supportive shoes with non-skid soles.  Drink plenty of water while you exercise.  Stop exercising if you feel weak, have trouble breathing, or have pain. Check with your stroke care provider before starting to exercise again.  Do not exercise if you feel tired or unwell.  Do not exercise in very cold or  warm temperatures. Summary  Physical activity and exercise are important for stroke recovery.  Exercise after a stroke has many benefits that include reducing the risk of another stroke, improving strength and balance, and improving your quality of life.  Work with your stroke care provider to find a safe exercise program that will fit your needs and abilities. This information is not intended to replace advice given to you by your health care provider. Make sure you discuss any questions you have with your health care provider. Document Revised: 02/28/2018 Document Reviewed: 02/28/2018 Elsevier Patient Education  2021 [place].    Sertraline Tablets What is this medicine? SERTRALINE (SER tra leen) is used to treat depression. It may also be used to treat obsessive compulsive disorder, panic disorder, post-trauma stress disorder, premenstrual dysphoric disorder (PMDD) or social anxiety. This medicine may be used for other purposes; ask your health care provider or pharmacist if you have  questions. COMMON BRAND NAME(S): Zoloft What should I tell my health care provider before I take this medicine? They need to know if you have any of these conditions:  bleeding disorders  bipolar disorder or a family history of bipolar disorder  glaucoma  heart disease  high blood pressure  history of irregular heartbeat  history of low levels of calcium, magnesium, or potassium in the blood  if you often drink alcohol  liver disease  receiving electroconvulsive therapy  seizures  suicidal thoughts, plans, or attempt; a previous suicide attempt by you or a family member  take medicines that treat or prevent blood clots  thyroid disease  an unusual or allergic reaction to sertraline, other medicines, foods, dyes, or preservatives  pregnant or trying to get pregnant  breast-feeding How should I use this medicine? Take this medicine by mouth with a glass of water. Follow the directions on the prescription label. You can take it with or without food. Take your medicine at regular intervals. Do not take your medicine more often than directed. Do not stop taking this medicine suddenly except upon the advice of your doctor. Stopping this medicine too quickly may cause serious side effects or your condition may worsen. A special MedGuide will be given to you by the pharmacist with each prescription and refill. Be sure to read this information carefully each time. Talk to your pediatrician regarding the use of this medicine in children. While this drug may be prescribed for children as young as 7 years for selected conditions, precautions do apply. Overdosage: If you think you have taken too much of this medicine contact a poison control center or emergency room at once. NOTE: This medicine is only for you. Do not share this medicine with others. What if I miss a dose? If you miss a dose, take it as soon as you can. If it is almost time for your next dose, take only that dose. Do  not take double or extra doses. What may interact with this medicine? Do not take this medicine with any of the following medications:  cisapride  dronedarone  linezolid  MAOIs like Carbex, Eldepryl, Marplan, Nardil, and Parnate  methylene blue (injected into a vein)  pimozide  thioridazine This medicine may also interact with the following medications:  alcohol  amphetamines  aspirin and aspirin-like medicines  certain medicines for depression, anxiety, or psychotic disturbances  certain medicines for fungal infections like ketoconazole, fluconazole, posaconazole, and itraconazole  certain medicines for irregular heart beat like flecainide, quinidine, propafenone  certain medicines for migraine headaches  like almotriptan, eletriptan, frovatriptan, naratriptan, rizatriptan, sumatriptan, zolmitriptan  certain medicines for sleep  certain medicines for seizures like carbamazepine, valproic acid, phenytoin  certain medicines that treat or prevent blood clots like warfarin, enoxaparin, dalteparin  cimetidine  digoxin  diuretics  fentanyl  isoniazid  lithium  NSAIDs, medicines for pain and inflammation, like ibuprofen or naproxen  other medicines that prolong the QT interval (cause an abnormal heart rhythm) like dofetilide  rasagiline  safinamide  supplements like St. John's wort, kava kava, valerian  tolbutamide  tramadol  tryptophan This list may not describe all possible interactions. Give your health care provider a list of all the medicines, herbs, non-prescription drugs, or dietary supplements you use. Also tell them if you smoke, drink alcohol, or use illegal drugs. Some items may interact with your medicine. What should I watch for while using this medicine? Tell your doctor if your symptoms do not get better or if they get worse. Visit your doctor or health care professional for regular checks on your progress. Because it may take several weeks  to see the full effects of this medicine, it is important to continue your treatment as prescribed by your doctor. Patients and their families should watch out for new or worsening thoughts of suicide or depression. Also watch out for sudden changes in feelings such as feeling anxious, agitated, panicky, irritable, hostile, aggressive, impulsive, severely restless, overly excited and hyperactive, or not being able to sleep. If this happens, especially at the beginning of treatment or after a change in dose, call your health care professional. Dennis Bast may get drowsy or dizzy. Do not drive, use machinery, or do anything that needs mental alertness until you know how this medicine affects you. Do not stand or sit up quickly, especially if you are an older patient. This reduces the risk of dizzy or fainting spells. Alcohol may interfere with the effect of this medicine. Avoid alcoholic drinks. Your mouth may get dry. Chewing sugarless gum or sucking hard candy, and drinking plenty of water may help. Contact your doctor if the problem does not go away or is severe. What side effects may I notice from receiving this medicine? Side effects that you should report to your doctor or health care professional as soon as possible:  allergic reactions like skin rash, itching or hives, swelling of the face, lips, or tongue  anxious  black, tarry stools  changes in vision  confusion  elevated mood, decreased need for sleep, racing thoughts, impulsive behavior  eye pain  fast, irregular heartbeat  feeling faint or lightheaded, falls  feeling agitated, angry, or irritable  hallucination, loss of contact with reality  loss of balance or coordination  loss of memory  painful or prolonged erections  restlessness, pacing, inability to keep still  seizures  stiff muscles  suicidal thoughts or other mood changes  trouble sleeping  unusual bleeding or bruising  unusually weak or  tired  vomiting Side effects that usually do not require medical attention (report to your doctor or health care professional if they continue or are bothersome):  change in appetite or weight  change in sex drive or performance  diarrhea  increased sweating  indigestion, nausea  tremors This list may not describe all possible side effects. Call your doctor for medical advice about side effects. You may report side effects to FDA at 1-800-FDA-1088. Where should I keep my medicine? Keep out of the reach of children. Store at room temperature between 15 and 30 degrees C (  59 and 86 degrees F). Throw away any unused medicine after the expiration date. NOTE: This sheet is a summary. It may not cover all possible information. If you have questions about this medicine, talk to your doctor, pharmacist, or health care provider.  2021 Elsevier/Gold Standard (2020-05-15 09:18:23)

## 2020-11-10 NOTE — Progress Notes (Signed)
[facility] [address] [place] [address] [phone]       HOSPITAL FOLLOW UP NOTE  [name] Date of Birth:  27-Apr-1961 Medical Record Number:  [id number]   Reason for Referral:  hospital stroke follow up    SUBJECTIVE:   CHIEF COMPLAINT:  Chief Complaint  Patient presents with  . Follow-up    TR with friend [name]) Pt is ok per him. Balance disturbance.     HPI:   [name] is a 60 y.o. male with history of diabetes, hypertension, hyperlipidemia, obesity, colon cancer, CAD stent MI status post stenting 10/2019 on aspirin and Brilinta who was admitted on 11/05/2020 for imbalance and falling.  Personally reviewed hospitalization pertinent progress notes, lab work and imaging summary provided.  Stroke work-up revealed left thalamic and posterior CR, bilateral occipital punctate, and right cerebellum small infarcts, embolic pattern secondary to unclear source s/p ILR.  On aspirin and Brilinta PTA and recommended continuation at discharge.  Uncontrolled DM with A1c 8.7.  HTN stable.  LDL 118 with history of statin intolerance and initiated pravastatin 20 mg daily. No prior stroke history.  Evaluated by therapies and recommended discharge to CIR for ongoing therapy needs with residual decreased functional mobility with imbalance and mild right-sided weakness.  Stroke: Left thalamic and posterior CR, bilateral occipital punctate, right cerebellum small infarcts, embolic pattern, secondary to unclear source  Resultant right sided impaired coordination  CT no acute abnormality  CTA head and neck left P2 severe stenosis, bilateral ICA bulb and siphon atherosclerosis  MRI left thalamic and left posterior CR infarcts, punctate bilateral occipital and right cerebellar infarcts.  2D Echo EF 55 to 60%  LE venous Doppler no DVT  TEE unremarkable, no PFO, EF 55-60%  Loop recorder placed  LDL 118  HgbA1c 8.7  Hypercoagulable work-up  negative   SCDs for VTE prophylaxis  aspirin 81 mg daily and Brilinta (ticagrelor) 90 mg bid prior to admission, now on aspirin 81 mg daily and Brilinta (ticagrelor) 90 mg bid.  Continue on discharge.  Patient counseled to be compliant with his antithrombotic medications  Ongoing aggressive stroke risk factor management  Therapy recommendations: CIR  Disposition: d/c to CIR on 4/5    Today, 11/10/2020, [name] is being seen for hospital follow-up accompanied by his friend, [name]  Reports residual imbalance, right sided weakness, memory loss, and blurred vision  Per patient, was told he would likely not benefit from [facility] therapies as he was able to maintain ADLs independently -he does report doing daily exercises at home -currently using cane and denies any recent falls Reports increased anxiety with recent stroke with residual deficits and lack of ability to return to work- use of diazepam per PCP but not beneficial - requesting a different medication to further help with this Able to maintain ADLs and IADLs independently.  He has not returned back to work or driving.  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  He does not have coverage for short-term disability - looking at applying for long term disability.  Denies new stroke/TIA symptoms  Compliant on aspirin and Brilinta without associated side effects Unable to tolerate pravastatin due to lower extremity myalgias- not currently on any type of therapy  Blood pressure today 125/72 - does not routinely monitor at home  Loop recorder has not shown atrial fibrillation thus far   No further concerns at this time  ROS:   14 system review of systems performed and negative with exception of those listed in HPI  PMH:  Past Medical History:  Diagnosis Date  . Anemia 10/03/2009   Qualifier: Diagnosis of  By: [name]   . Anxiety associated  with depression 10/11/2013  . Colon cancer ([place])    right colon cancer- adenocarcinoma CEA level isnrmal at 1.8  . Diabetes mellitus   . Diabetes mellitus type 2 in obese [facility]) 03/20/2010   Qualifier: Diagnosis of  By: [name]    . Erectile dysfunction 06/22/2016  . Essential hypertension 06/05/2014  . FATTY LIVER DISEASE 11/04/2009   Qualifier: Diagnosis of  By: [name] MD, Docia Chuck   Qualifier: Diagnosis of  By: [name] MD, Docia Chuck  Last Assessment & Plan:  conirmed by CT scan of abdomen in April 2016 encouraged to minimize simple carbs and fatty foods.  Marland Kitchen GERD 11/04/2009   Qualifier: Diagnosis of  By: [name] MD, Docia Chuck   . Great toe pain, right 04/18/2017  . Hepatic artery stenosis ([facility])   . History of colon cancer 01/02/2010   Qualifier: Diagnosis of  By: [name] Dr [name]  Last Assessment & Plan:  Follows closely with gastroenterology, Dr [name]  . Hyperlipidemia   . Hyperlipidemia, mixed 09/07/2010   Qualifier: Diagnosis of  By: [name]   . Hypertension   . Hypertriglyceridemia 12/14/2010  . Hypotestosteronism 06/22/2016  . INSOMNIA, CHRONIC 03/20/2010   Qualifier: Diagnosis of  By: [name] Ambien 10 mg daily does not keep asleep AdvilPM is over sedating and has trouble waking up   . Iron deficiency anemia 2011  . Low testosterone 06/22/2016  . Malignant neoplasm of colon ([place]) 01/02/2010   Qualifier: Diagnosis of  By: [name] Dr [name]   . Muscle spasm of back 06/05/2014  . Nipple pain 09/30/2016  . Obesity   . Palpitations 06/05/2014  . Preventative health care 10/08/2012  . Psoriasis   . Psoriatic arthritis ([place]) 08/25/2017  . Right knee pain 11/17/2011    PSH:  Past Surgical History:  Procedure Laterality Date  . BUBBLE STUDY  10/21/2020   Procedure: BUBBLE STUDY;  Surgeon: [name], MD;  Location: [facility] ENDOSCOPY;  Service: Cardiovascular;;  . CARPAL TUNNEL RELEASE     RIGHT  . CHOLECYSTECTOMY  1994  . COLONOSCOPY    . CORONARY/GRAFT  ACUTE MI REVASCULARIZATION N/A 10/27/2019   Procedure: Coronary/Graft Acute MI Revascularization;  Surgeon: [name], MD;  Location: [place] CV LAB;  Service: Cardiovascular;  Laterality: N/A;  . HEMICOLECTOMY     12/17/2009 right  . LEFT HEART CATH AND CORONARY ANGIOGRAPHY N/A 10/27/2019   Procedure: LEFT HEART CATH AND CORONARY ANGIOGRAPHY;  Surgeon: [name], MD;  Location: [place] CV LAB;  Service: Cardiovascular;  Laterality: N/A;  . LOOP RECORDER INSERTION N/A 10/21/2020   Procedure: LOOP RECORDER INSERTION;  Surgeon: [name], MD;  Location: [place] CV LAB;  Service: Cardiovascular;  Laterality: N/A;  . POLYPECTOMY    . TEE WITHOUT CARDIOVERSION N/A 10/21/2020   Procedure: TRANSESOPHAGEAL ECHOCARDIOGRAM (TEE);  Surgeon: [name], MD;  Location: [facility] ENDOSCOPY;  Service: Cardiovascular;  Laterality: N/A;    Social History:  Social History   Socioeconomic History  . Marital status: Single    Spouse name: [name]  . Number of children: 0  . Years of education: Not on file  .  Highest education level: Not on file  Occupational History  . Occupation: Merchant navy officer    Comment: Recruitment consultant  Tobacco Use  . Smoking status: Former Smoker    Types: Cigarettes    Quit date: 10/27/2019    Years since quitting: 1.0  . Smokeless tobacco: Never Used  Vaping Use  . Vaping Use: Never used  Substance and Sexual Activity  . Alcohol use: No  . Drug use: No  . Sexual activity: Yes    Comment: lives with girlfriend, no dietary restrictions  Other Topics Concern  . Not on file  Social History Narrative   Occupation: Merchant navy officer (Recruitment consultant)   Single  (lives with [name])   no children    former smoker   Illicit Drug Use - no    Social Determinants of Radio broadcast assistant Strain: Not on file  Food Insecurity: Not on file  Transportation Needs: Not on file  Physical Activity: Not on file  Stress: Not on file  Social Connections: Not on  file  Intimate Partner Violence: Not on file    Family History:  Family History  Problem Relation Age of Onset  . Stomach cancer Mother        diedinher 21's  . Diabetes type II Brother        boderline  . Diabetes Brother        type II  . Alcohol abuse Brother   . Other Neg Hx        cad,prostate ca, colon ca  . Coronary artery disease Neg Hx   . Cancer Neg Hx        colon, prostate  . Colon cancer Neg Hx   . Esophageal cancer Neg Hx   . Rectal cancer Neg Hx     Medications:   Current Outpatient Medications on File Prior to Visit  Medication Sig Dispense Refill  . acetaminophen (TYLENOL) 325 MG tablet Take 2 tablets (650 mg total) by mouth every 4 (four) hours as needed for mild pain (or temp > 37.5 C (99.5 F)).    Marland Kitchen aspirin EC 81 MG tablet Take 1 tablet (81 mg total) by mouth daily. 90 tablet 3  . clobetasol cream (TEMOVATE) 6.81 % Apply 1 application topically at bedtime as needed (psoriasis).    . diazepam (VALIUM) 5 MG tablet Take 1 tablet (5 mg total) by mouth daily as needed for anxiety. 10 tablet 0  . folic acid (FOLVITE) 1 MG tablet Take 1 tablet (1 mg total) by mouth daily. 30 tablet 0  . glucose blood (CONTOUR NEXT TEST) test strip Use to check blood sugar tid.  Dx code: E11.9 300 each 1  . insulin glargine (LANTUS) 100 UNIT/ML Solostar Pen Inject 43 Units into the skin daily. 15 mL 11  . Ixekizumab 80 MG/ML SOSY Inject 80 mg into the skin every 28 (twenty-eight) days.    Marland Kitchen losartan (COZAAR) 25 MG tablet Take 0.5 tablets (12.5 mg total) by mouth daily. 30 tablet 0  . melatonin 5 MG TABS Take 1 tablet (5 mg total) by mouth at bedtime. 30 tablet 0  . methotrexate (RHEUMATREX) 2.5 MG tablet Take 2.5 mg by mouth once a week. Takes 6 tabs weekly on Sundays.    . metoprolol succinate (TOPROL XL) 25 MG 24 hr tablet Take 1 tablet (25 mg total) by mouth daily. 30 tablet 0  . potassium chloride (KLOR-CON) 10 MEQ tablet Take 1 tablet (10 mEq total) by mouth daily. 7  tablet 0   . predniSONE (DELTASONE) 5 MG tablet Take 1 tablet by mouth daily as needed (psoriasis).    . ticagrelor (BRILINTA) 90 MG TABS tablet TAKE 1 TABLET(90 MG) BY MOUTH TWICE DAILY 90 tablet 0   No current facility-administered medications on file prior to visit.    Allergies:   Allergies  Allergen Reactions  . Statins     Causes muscle weakness       OBJECTIVE:  Physical Exam  Vitals:   11/10/20 1401  BP: 125/72  Pulse: 93  Weight: 235 lb (106.6 kg)  Height: 5\' 8"  (1.727 m)   Body mass index is 35.73 kg/m. No exam data present  Post stroke PHQ 2/9 Depression screen PHQ 2/9 11/10/2020  Decreased Interest 0  Down, Depressed, Hopeless 1  PHQ - 2 Score 1  Altered sleeping -  Tired, decreased energy -  Change in appetite -  Feeling bad or failure about yourself  -  Trouble concentrating -  Moving slowly or fidgety/restless -  Suicidal thoughts -  PHQ-9 Score -  Difficult doing work/chores -  Some recent data might be hidden    Post stroke GAD 7 GAD 7 : Generalized Anxiety Score 11/10/2020  Nervous, Anxious, on Edge 3  Control/stop worrying 3  Worry too much - different things 3  Trouble relaxing 2  Restless 1  Easily annoyed or irritable 2  Afraid - awful might happen 2  Total GAD 7 Score 16    General: well developed, well nourished,  moderately anxious middle-age Caucasian male, seated, in no evident distress Head: head normocephalic and atraumatic.   Neck: supple with no carotid or supraclavicular bruits Cardiovascular: regular rate and rhythm, no murmurs Musculoskeletal: no deformity Skin:  no rash/petichiae Vascular:  Normal pulses all extremities   Neurologic Exam Mental Status: Awake and fully alert.   Fluent speech and language.  Oriented to place and time. Recent memory subjectively impaired and remote memory intact. Attention span, concentration and fund of knowledge appropriate during visit. Mood and affect mostly appropriate but would become  irritable quickly.  Cranial Nerves: Fundoscopic exam reveals sharp disc margins. Pupils equal, briskly reactive to light. Extraocular movements full without nystagmus. Visual fields full to confrontation. Hearing intact. Facial sensation intact. Face, tongue, palate moves normally and symmetrically.  Motor: Normal bulk and tone. Normal strength in all tested extremity muscles except possible slight right-sided weakness Sensory.: intact to touch , pinprick , position and vibratory sensation.  Coordination: Rapid alternating movements normal in all extremities. Finger-to-nose and heel-to-shin performed accurately on left side with mild incoordination on right side. Gait and Station: Arises from chair without difficulty. Stance is normal. Gait demonstrates normal stride length and mild imbalance especially with turns with use of cane. Tandem walk and heel toe performed with mild to moderate difficulty.  Romberg negative Reflexes: 1+ and symmetric. Toes downgoing.     NIHSS  0 Modified Rankin  2      ASSESSMENT: [name] is a 60 y.o. year old male presented with imbalance and falling on 10/18/2020 with stroke work-up revealing left thalamic and posterior CR, bilateral occipital punctate and right cerebellum small infarcts, embolic pattern secondary to unclear source s/p ILR placement. Vascular risk factors include HTN, HLD, DM, CAD/MI s/p stent 10/2019, and obesity.      PLAN:  1. Cryptogenic stroke:  a. Residual deficit: gait impairment with imbalance, short-term memory loss, subjective blurred vision and subjective (possible very slight) right-sided weakness.  Highly encouraged initiating  therapies outpatient but pt consistently questions need or benefit as he did not find inpatient rehab to be very helpful.  He is also concerned regarding transportation and finances for outpatient rehab.  They plan on further speaking tomorrow with Dr. Letta Pate as well as assistance for long-term  disability.  I do believe that he has been making progress compared to evaluations during hospital which was discussed as well as discussion regarding typical timeframe for recovery and importance of therapy b. Post stroke anxiety: Initiate sertraline 25 mg daily - has f/u scheduled with PCP in June - can further evaluate at that time for possible need of dosage increase or ongoing use c. Loop recorder has not shown atrial fibrillation thus far - will continue to be monitored by cardiology d. Continue aspirin 81 mg daily and Brilinta (ticagrelor) 90 mg bid  and start Zetia  for secondary stroke prevention.   e. Discussed secondary stroke prevention measures and importance of close PCP follow up for aggressive stroke risk factor management  2. HTN: BP goal <130/90.  Stable on current regimen per PCP 3. HLD: LDL goal <70. Recent LDL 118 -started pravastatin during recent stroke admission but unable to tolerate with history of statin intolerance previously with atorvastatin and Crestor and now pravastatin.  Recommend initiating Zetia 10 mg daily and recheck cholesterol levels at follow-up visit or with PCP. If remains uncontrolled, would recommend initiating PCSK9 inhibitor 4. DMII: A1c goal<7.0. Recent A1c 8.7.  Monitored by PCP    Follow up in 3 months or call earlier if needed   CC:  Day provider: Dr. Leonie Man PCP: Mosie Lukes, MD    I spent 45 minutes of face-to-face and non-face-to-face time with patient and friend, [name].  This included previsit chart review including recent hospitalization pertinent progress notes, lab work and imaging, lab review, study review, order entry, electronic health record documentation, patient education regarding recent stroke including etiology and monitoring of ILR, residual deficits, importance of managing stroke risk factors and answered all other questions to patient and friends satisfaction  Frann Rider, AGNP-BC  [facility] [address] [place], [place] [address]  Phone [phone] Fax [phone] Note: This document was prepared with digital dictation and possible smart phrase technology. Any transcriptional errors that result from this process are unintentional.

## 2020-11-10 NOTE — Progress Notes (Signed)
I agree with the above plan 

## 2020-11-11 ENCOUNTER — Other Ambulatory Visit: Payer: Self-pay

## 2020-11-11 ENCOUNTER — Encounter
Payer: Commercial Managed Care - PPO | Attending: Physical Medicine & Rehabilitation | Admitting: Physical Medicine & Rehabilitation

## 2020-11-11 ENCOUNTER — Encounter: Payer: Self-pay | Admitting: Physical Medicine & Rehabilitation

## 2020-11-11 VITALS — BP 121/76 | HR 89 | Temp 98.3°F | Ht 68.0 in | Wt 234.0 lb

## 2020-11-11 DIAGNOSIS — I69398 Other sequelae of cerebral infarction: Secondary | ICD-10-CM

## 2020-11-11 DIAGNOSIS — R269 Unspecified abnormalities of gait and mobility: Secondary | ICD-10-CM | POA: Diagnosis present

## 2020-11-11 NOTE — Patient Instructions (Signed)
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
## 2020-11-13 ENCOUNTER — Encounter: Payer: Self-pay | Admitting: Physical Medicine & Rehabilitation

## 2020-11-13 DIAGNOSIS — I69398 Other sequelae of cerebral infarction: Secondary | ICD-10-CM | POA: Insufficient documentation

## 2020-11-14 ENCOUNTER — Telehealth: Payer: Self-pay

## 2020-11-14 NOTE — Telephone Encounter (Signed)
[name] has requested a "New" return to work note. He would like the return date to be 12/08/2020 with no restrictions.   [name] also wanted you to know he will seeing a Chiropractor.   Call back phone [phone].  (Patient is aware you are not in the office until next Tuesday).  Thank you

## 2020-11-19 NOTE — Telephone Encounter (Signed)
Mr. Barreiro is aware of Dr. Ella Bodo reply. Patient wanted to know if he could have tjhe weight limited changed from 10lbs to 35lbs. Because his tool belt weighs more than  10lbs.   Patient has been advised to check with [facility] for weight changes. Or follow up with Dr. Ella Bodo in one month.

## 2020-11-20 ENCOUNTER — Encounter: Payer: Self-pay | Admitting: Physical Medicine & Rehabilitation

## 2020-11-20 ENCOUNTER — Encounter: Payer: Self-pay | Admitting: Adult Health

## 2020-11-21 NOTE — Telephone Encounter (Signed)
A new RTW note is at the front desk for pick-up. Patient informed.

## 2020-11-24 ENCOUNTER — Inpatient Hospital Stay: Payer: Self-pay | Admitting: Adult Health

## 2020-11-24 NOTE — Telephone Encounter (Signed)
Should be further discussing with Dr. Letta Pate regarding indication for weight restriction as he is wanting this adjusted.

## 2020-11-25 ENCOUNTER — Ambulatory Visit (INDEPENDENT_AMBULATORY_CARE_PROVIDER_SITE_OTHER): Payer: Commercial Managed Care - PPO

## 2020-11-25 DIAGNOSIS — I639 Cerebral infarction, unspecified: Secondary | ICD-10-CM

## 2020-11-25 LAB — CUP PACEART REMOTE DEVICE CHECK
Date Time Interrogation Session: 20220508163623
Implantable Pulse Generator Implant Date: 20220405

## 2020-11-28 ENCOUNTER — Encounter: Payer: Self-pay | Admitting: Physical Medicine & Rehabilitation

## 2020-12-17 NOTE — Progress Notes (Signed)
Carelink Summary Report / Loop Recorder 

## 2020-12-25 ENCOUNTER — Other Ambulatory Visit: Payer: Self-pay

## 2020-12-25 ENCOUNTER — Ambulatory Visit (INDEPENDENT_AMBULATORY_CARE_PROVIDER_SITE_OTHER): Payer: Commercial Managed Care - PPO | Admitting: Family Medicine

## 2020-12-25 DIAGNOSIS — Z8673 Personal history of transient ischemic attack (TIA), and cerebral infarction without residual deficits: Secondary | ICD-10-CM | POA: Diagnosis not present

## 2020-12-25 DIAGNOSIS — E782 Mixed hyperlipidemia: Secondary | ICD-10-CM

## 2020-12-25 DIAGNOSIS — Z789 Other specified health status: Secondary | ICD-10-CM

## 2020-12-25 DIAGNOSIS — E1169 Type 2 diabetes mellitus with other specified complication: Secondary | ICD-10-CM | POA: Diagnosis not present

## 2020-12-25 DIAGNOSIS — E669 Obesity, unspecified: Secondary | ICD-10-CM | POA: Diagnosis not present

## 2020-12-25 DIAGNOSIS — F418 Other specified anxiety disorders: Secondary | ICD-10-CM

## 2020-12-25 DIAGNOSIS — Z79899 Other long term (current) drug therapy: Secondary | ICD-10-CM

## 2020-12-25 LAB — COMPREHENSIVE METABOLIC PANEL
ALT: 25 U/L (ref 0–53)
AST: 17 U/L (ref 0–37)
Albumin: 4.2 g/dL (ref 3.5–5.2)
Alkaline Phosphatase: 48 U/L (ref 39–117)
BUN: 12 mg/dL (ref 6–23)
CO2: 22 mEq/L (ref 19–32)
Calcium: 9.5 mg/dL (ref 8.4–10.5)
Chloride: 106 mEq/L (ref 96–112)
Creatinine, Ser: 0.77 mg/dL (ref 0.40–1.50)
GFR: 98.94 mL/min (ref >60.00)
Glucose, Bld: 116 mg/dL — ABNORMAL HIGH (ref 70–99)
Potassium: 4 mEq/L (ref 3.5–5.1)
Sodium: 139 mEq/L (ref 135–145)
Total Bilirubin: 1.4 mg/dL — ABNORMAL HIGH (ref 0.2–1.2)
Total Protein: 7.1 g/dL (ref 6.0–8.3)

## 2020-12-25 LAB — VITAMIN D 25 HYDROXY (VIT D DEFICIENCY, FRACTURES): VITD: 25.42 ng/mL — ABNORMAL LOW (ref 30.00–100.00)

## 2020-12-25 LAB — CBC WITH DIFFERENTIAL/PLATELET
Basophils Absolute: 0.1 10*3/uL (ref 0.0–0.1)
Basophils Relative: 0.7 % (ref 0.0–3.0)
Eosinophils Absolute: 0.1 10*3/uL (ref 0.0–0.7)
Eosinophils Relative: 1.4 % (ref 0.0–5.0)
HCT: 42.8 % (ref 39.0–52.0)
Hemoglobin: 14.8 g/dL (ref 13.0–17.0)
Lymphocytes Relative: 18.4 % (ref 12.0–46.0)
Lymphs Abs: 1.7 10*3/uL (ref 0.7–4.0)
MCHC: 34.7 g/dL (ref 30.0–36.0)
MCV: 91.6 fl (ref 78.0–100.0)
Monocytes Absolute: 0.6 10*3/uL (ref 0.1–1.0)
Monocytes Relative: 7.2 % (ref 3.0–12.0)
Neutro Abs: 6.5 10*3/uL (ref 1.4–7.7)
Neutrophils Relative %: 72.3 % (ref 43.0–77.0)
Platelets: 277 10*3/uL (ref 150.0–400.0)
RBC: 4.67 Mil/uL (ref 4.22–5.81)
RDW: 12.3 % (ref 11.5–15.5)
WBC: 9 10*3/uL (ref 4.0–10.5)

## 2020-12-25 MED ORDER — SERTRALINE HCL 50 MG PO TABS: ORAL_TABLET | ORAL | 3 refills | Status: DC

## 2020-12-25 MED ORDER — CLONAZEPAM 1 MG PO TABS: 1.0000 mg | ORAL_TABLET | Freq: Two times a day (BID) | ORAL | 2 refills | Status: DC | PRN

## 2020-12-25 MED ORDER — MULTIVITAMIN ADULT (MINERALS) PO TABS: 1.0000 | ORAL_TABLET | Freq: Every day | ORAL | 3 refills | Status: DC

## 2020-12-25 NOTE — Assessment & Plan Note (Addendum)
He tries to stay busy and considering trying to go back to work. He is stressed and lives alone. He was given Sertraline at hospital but he did not take it long. He agrees to restart Sertraline 50 mg tab, 1/2 tab po daily x 7 days and then increase to a full tab daily

## 2020-12-25 NOTE — Patient Instructions (Signed)
Stroke Prevention Some medical conditions and behaviors are associated with a higher chance of having a stroke. You can help prevent a stroke by making nutrition, lifestyle, and other changes, including managing any medical conditions you may have. What nutrition changes can be made?  Eat healthy foods. You can do this by: Choosing foods high in fiber, such as fresh fruits and vegetables and whole grains. Eating at least 5 or more servings of fruits and vegetables a day. Try to fill half of your plate at each meal with fruits and vegetables. Choosing lean protein foods, such as lean cuts of meat, poultry without skin, fish, tofu, beans, and nuts. Eating low-fat dairy products. Avoiding foods that are high in salt (sodium). This can help lower blood pressure. Avoiding foods that have saturated fat, trans fat, and cholesterol. This can help prevent high cholesterol. Avoiding processed and premade foods. Follow your health care provider's specific guidelines for losing weight, controlling high blood pressure (hypertension), lowering high cholesterol, and managing diabetes. These may include: Reducing your daily calorie intake. Limiting your daily sodium intake to 1,500 milligrams (mg). Using only healthy fats for cooking, such as olive oil, canola oil, or sunflower oil. Counting your daily carbohydrate intake. What lifestyle changes can be made? Maintain a healthy weight. Talk to your health care provider about your ideal weight. Get at least 30 minutes of moderate physical activity at least 5 days a week. Moderate activity includes brisk walking, biking, and swimming. Do not use any products that contain nicotine or tobacco, such as cigarettes and e-cigarettes. If you need help quitting, ask your health care provider. It may also be helpful to avoid exposure to secondhand smoke. Limit alcohol intake to no more than 1 drink a day for nonpregnant women and 2 drinks a day for men. One drink equals 12  oz of beer, 5 oz of wine, or 1 oz of hard liquor. Stop any illegal drug use. Avoid taking birth control pills. Talk to your health care provider about the risks of taking birth control pills if: You are over 35 years old. You smoke. You get migraines. You have ever had a blood clot. What other changes can be made? Manage your cholesterol levels. Eating a healthy diet is important for preventing high cholesterol. If cholesterol cannot be managed through diet alone, you may also need to take medicines. Take any prescribed medicines to control your cholesterol as told by your health care provider. Manage your diabetes. Eating a healthy diet and exercising regularly are important parts of managing your blood sugar. If your blood sugar cannot be managed through diet and exercise, you may need to take medicines. Take any prescribed medicines to control your diabetes as told by your health care provider. Control your hypertension. To reduce your risk of stroke, try to keep your blood pressure below 130/80. Eating a healthy diet and exercising regularly are an important part of controlling your blood pressure. If your blood pressure cannot be managed through diet and exercise, you may need to take medicines. Take any prescribed medicines to control hypertension as told by your health care provider. Ask your health care provider if you should monitor your blood pressure at home. Have your blood pressure checked every year, even if your blood pressure is normal. Blood pressure increases with age and some medical conditions. Get evaluated for sleep disorders (sleep apnea). Talk to your health care provider about getting a sleep evaluation if you snore a lot or have excessive sleepiness. Take over-the-counter   and prescription medicines only as told by your health care provider. Aspirin or blood thinners (antiplatelets or anticoagulants) may be recommended to reduce your risk of forming blood clots that can  lead to stroke. Make sure that any other medical conditions you have, such as atrial fibrillation or atherosclerosis, are managed. What are the warning signs of a stroke? The warning signs of a stroke can be easily remembered as BEFAST. B is for balance. Signs include: Dizziness. Loss of balance or coordination. Sudden trouble walking. E is for eyes. Signs include: A sudden change in vision. Trouble seeing. F is for face. Signs include: Sudden weakness or numbness of the face. The face or eyelid drooping to one side. A is for arms. Signs include: Sudden weakness or numbness of the arm, usually on one side of the body. S is for speech. Signs include: Trouble speaking (aphasia). Trouble understanding. T is for time. These symptoms may represent a serious problem that is an emergency. Do not wait to see if the symptoms will go away. Get medical help right away. Call your local emergency services (911 in the [place] Do not drive yourself to the hospital. Other signs of stroke may include: A sudden, severe headache with no known cause. Nausea or vomiting. Seizure. Where to find more information For more information, visit: American Stroke Association: www.strokeassociation.org National Stroke Association: www.stroke.org Summary You can prevent a stroke by eating healthy, exercising, not smoking, limiting alcohol intake, and managing any medical conditions you may have. Do not use any products that contain nicotine or tobacco, such as cigarettes and e-cigarettes. If you need help quitting, ask your health care provider. It may also be helpful to avoid exposure to secondhand smoke. Remember BEFAST for warning signs of stroke. Get help right away if you or a loved one has any of these signs. This information is not intended to replace advice given to you by your health care provider. Make sure you discuss any questions you have with your health care provider. Document Revised: 06/17/2017  Document Reviewed: 08/10/2016 Elsevier Patient Education  2021 Elsevier Inc.  

## 2020-12-25 NOTE — Assessment & Plan Note (Signed)
hgba1c acceptable, minimize simple carbs. Increase exercise as tolerated. Continue current meds. Recent sugars 100 to 150. He is trying to minimize his carbohydrate intake. Eating more salads and less fast food.
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
## 2020-12-28 DIAGNOSIS — Z789 Other specified health status: Secondary | ICD-10-CM | POA: Insufficient documentation

## 2020-12-28 NOTE — Assessment & Plan Note (Signed)
Left thalamic region 2022, no residual symptoms after rehab. He is attempting to get cleared to return to work. Continue to control risk factors.

## 2020-12-28 NOTE — Assessment & Plan Note (Signed)
Patient has tried numerous statins and has been unable to tolerate them

## 2020-12-28 NOTE — Assessment & Plan Note (Signed)
Encourage heart healthy diet such as MIND or DASH diet, increase exercise, avoid trans fats, simple carbohydrates and processed foods, consider a krill or fish or flaxseed oil cap daily. Zetia is tolerated. Statins are not

## 2020-12-29 ENCOUNTER — Other Ambulatory Visit: Payer: Self-pay

## 2020-12-29 ENCOUNTER — Ambulatory Visit (INDEPENDENT_AMBULATORY_CARE_PROVIDER_SITE_OTHER): Payer: Commercial Managed Care - PPO

## 2020-12-29 DIAGNOSIS — I639 Cerebral infarction, unspecified: Secondary | ICD-10-CM | POA: Diagnosis not present

## 2020-12-29 LAB — DRUG MONITORING, PANEL 8 WITH CONFIRMATION, URINE
6 Acetylmorphine: NEGATIVE ng/mL (ref <10)
Alcohol Metabolites: NEGATIVE ng/mL
Alphahydroxyalprazolam: NEGATIVE ng/mL (ref <25)
Alphahydroxymidazolam: NEGATIVE ng/mL (ref <50)
Alphahydroxytriazolam: NEGATIVE ng/mL (ref <50)
Aminoclonazepam: NEGATIVE ng/mL (ref <25)
Amphetamines: NEGATIVE ng/mL (ref <500)
Benzodiazepines: POSITIVE ng/mL — AB (ref <100)
Buprenorphine, Urine: NEGATIVE ng/mL (ref <5)
Cocaine Metabolite: NEGATIVE ng/mL (ref <150)
Creatinine: 227.4 mg/dL
Hydroxyethylflurazepam: NEGATIVE ng/mL (ref <50)
Lorazepam: NEGATIVE ng/mL (ref <50)
MDMA: NEGATIVE ng/mL (ref <500)
Marijuana Metabolite: NEGATIVE ng/mL (ref <20)
Marijuana Metabolite: NEGATIVE ng/mL (ref <5)
Nordiazepam: 394 ng/mL — ABNORMAL HIGH (ref <50)
Opiates: NEGATIVE ng/mL (ref <100)
Oxazepam: 1907 ng/mL — ABNORMAL HIGH (ref <50)
Oxidant: NEGATIVE ug/mL
Oxycodone: NEGATIVE ng/mL (ref <100)
Temazepam: 1253 ng/mL — ABNORMAL HIGH (ref <50)
pH: 5.1 (ref 4.5–9.0)

## 2020-12-29 LAB — DM TEMPLATE

## 2020-12-29 MED ORDER — VITAMIN D (ERGOCALCIFEROL) 1.25 MG (50000 UNIT) PO CAPS: 50000.0000 [IU] | ORAL_CAPSULE | ORAL | 4 refills | Status: DC

## 2020-12-30 LAB — CUP PACEART REMOTE DEVICE CHECK
Date Time Interrogation Session: 20220610163803
Implantable Pulse Generator Implant Date: 20220405

## 2021-01-08 ENCOUNTER — Other Ambulatory Visit: Payer: Self-pay

## 2021-01-08 ENCOUNTER — Encounter
Payer: Commercial Managed Care - PPO | Attending: Physical Medicine & Rehabilitation | Admitting: Physical Medicine & Rehabilitation

## 2021-01-08 ENCOUNTER — Encounter: Payer: Self-pay | Admitting: Physical Medicine & Rehabilitation

## 2021-01-08 VITALS — BP 126/79 | HR 82 | Temp 97.8°F | Ht 68.0 in | Wt 220.0 lb

## 2021-01-08 DIAGNOSIS — I69398 Other sequelae of cerebral infarction: Secondary | ICD-10-CM | POA: Diagnosis present

## 2021-01-08 DIAGNOSIS — R269 Unspecified abnormalities of gait and mobility: Secondary | ICD-10-CM | POA: Diagnosis present

## 2021-01-08 NOTE — Progress Notes (Signed)
Subjective:    Patient ID: [name], male    DOB: 1961/06/17, 60 y.o.   MRN: [id number] 59 y.o. right-handed male with history of CAD/STEMI maintained on aspirin, tobacco abuse, obesity, diabetes mellitus, chronic systolic congestive heart failure, anemia and colon cancer.  Per chart review lives alone independent prior to admission.  Presented 10/18/2020 with balance difficulties x2 days and noted fall without loss of conscious.  He denied any weakness or double vision.  Patient initially MRI at [facility] demonstrated multifocal embolic appearing strokes in the posterior circulation including the right cerebellum and left thalamus, periventricular white matter.  Patient did not receive TPA.  CT angiogram of head and neck negative CTA for large vessel occlusion.  50% athermatous stenosis about the left carotid bulb/proximal left ICA.  Bilateral lower extremity Dopplers negative.  Echocardiogram with ejection fraction of 55 to 57% grade 2 diastolic dysfunction.  Admission chemistries unremarkable aside glucose 184 urinalysis negative hemoglobin A1c 8.7 urine drug screen positive benzos, troponin negative.  Maintained on Brilinta for CVA as well as low-dose aspirin.  Patient was scheduled for TEE showed no cardiac source of embolus without thrombus negative for PFO Loop recorder was placed.  Therapy evaluations completed due to patient decreased functional mobility was admitted for a comprehensive rehab program.   Admit date: 10/21/2020 Discharge date: 10/28/2020       HPI  Patient no longer getting PT or OT.  He is doing treadmill exercises at home.  The patient is also doing yard work such as Horticulturist, commercial.  He has had no falls or loss of balance.  He remains independent with all self-care and mobility.  He no longer uses an assistive device.  From a visual standpoint he is doing okay.  He did go to his eye doctor and got new bifocals made however he may need some further  adjustment. He remains continent of both bowel and bladder. He would like to return to work he feels like he can do all his job duties.  I reviewed his job duties with him.  He has been driving without difficulty.  He just bought a new car and he put 1000 miles on it without issues. Pain Inventory Average Pain 0 Pain Right Now 0 My pain is  no pain  LOCATION OF PAIN  no pain  BOWEL Number of stools per week: not sure but pt says its normal Oral laxative use No  Type of laxative n/a Enema or suppository use No  History of colostomy No  Incontinent No   BLADDER Normal In and out cath, frequency n/a Able to self cath No  Bladder incontinence No  Frequent urination No  Leakage with coughing No  Difficulty starting stream No  Incomplete bladder emptying No    Mobility walk without assistance ability to climb steps?  yes do you drive?  yes  Function not employed: date last employed march 2022  Neuro/Psych numbness anxiety  Prior Studies N/a  Physicians involved in your care N/a   Family History  Problem Relation Age of Onset   Stomach cancer Mother        diedinher 25's   Diabetes type II Brother        boderline   Diabetes Brother        type II   Alcohol abuse Brother    Other Neg Hx        cad,prostate ca, colon ca   Coronary artery disease Neg Hx  Cancer Neg Hx        colon, prostate   Colon cancer Neg Hx    Esophageal cancer Neg Hx    Rectal cancer Neg Hx    Social History   Socioeconomic History   Marital status: Single    Spouse name: [name]   Number of children: 0   Years of education: Not on file   Highest education level: Not on file  Occupational History   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  Tobacco Use   Smoking status: Former    Pack years: 0.00    Types: Cigarettes    Quit date: 10/27/2019    Years since quitting: 1.2   Smokeless tobacco: Never  Vaping Use   Vaping Use: Never used  Substance and Sexual  Activity   Alcohol use: No   Drug use: No   Sexual activity: Yes    Comment: lives with girlfriend, no dietary restrictions  Other Topics Concern   Not on file  Social History Narrative   Occupation: Merchant navy officer (Recruitment consultant)   Single  (lives with [name])   no children    former smoker   Illicit Drug Use - no    Social Determinants of Health   Financial Resource Strain: Not on file  Food Insecurity: Not on file  Transportation Needs: Not on file  Physical Activity: Not on file  Stress: Not on file  Social Connections: Not on file   Past Surgical History:  Procedure Laterality Date   BUBBLE STUDY  10/21/2020   Procedure: BUBBLE STUDY;  Surgeon: [name], MD;  Location: [facility];  Service: Cardiovascular;;   CARPAL TUNNEL RELEASE     RIGHT   CHOLECYSTECTOMY  1994   COLONOSCOPY     CORONARY/GRAFT ACUTE MI REVASCULARIZATION N/A 10/27/2019   Procedure: Coronary/Graft Acute MI Revascularization;  Surgeon: [name], MD;  Location: [place] [facility];  Service: Cardiovascular;  Laterality: N/A;   HEMICOLECTOMY     12/17/2009 right   LEFT HEART CATH AND CORONARY ANGIOGRAPHY N/A 10/27/2019   Procedure: LEFT HEART CATH AND CORONARY ANGIOGRAPHY;  Surgeon: [name], MD;  Location: [place] [facility];  Service: Cardiovascular;  Laterality: N/A;   LOOP RECORDER INSERTION N/A 10/21/2020   Procedure: LOOP RECORDER INSERTION;  Surgeon: [name], MD;  Location: [place] [facility];  Service: Cardiovascular;  Laterality: N/A;   POLYPECTOMY     TEE WITHOUT CARDIOVERSION N/A 10/21/2020   Procedure: TRANSESOPHAGEAL ECHOCARDIOGRAM (TEE);  Surgeon: [name], MD;  Location: [facility] ENDOSCOPY;  Service: Cardiovascular;  Laterality: N/A;   Past Medical History:  Diagnosis Date   Anemia 10/03/2009   Qualifier: Diagnosis of  By: [name]    Anxiety associated with depression 10/11/2013   Colon cancer ([employer])    right colon cancer- adenocarcinoma CEA level  isnrmal at 1.8   Diabetes mellitus    Diabetes mellitus type 2 in obese ([place]) 03/20/2010   Qualifier: Diagnosis of  By: [name]     Erectile dysfunction 06/22/2016   Essential hypertension 06/05/2014   FATTY LIVER DISEASE 11/04/2009   Qualifier: Diagnosis of  By: [name] MD, Docia Chuck   Qualifier: Diagnosis of  By: [name] MD, Docia Chuck  Last Assessment & Plan:  conirmed by CT scan of abdomen in April 2016 encouraged to minimize simple carbs and fatty foods.   GERD 11/04/2009   Qualifier: Diagnosis of  By: [name] MD, Jenny Reichmann  N    Great toe pain, right 04/18/2017   Hepatic artery stenosis ([place])    History of colon cancer 01/02/2010   Qualifier: Diagnosis of  By: [name] Dr [name]  Last Assessment & Plan:  Follows closely with gastroenterology, Dr [name]   Hyperlipidemia    Hyperlipidemia, mixed 09/07/2010   Qualifier: Diagnosis of  By: [name]    Hypertension    Hypertriglyceridemia 12/14/2010   Hypotestosteronism 06/22/2016   INSOMNIA, CHRONIC 03/20/2010   Qualifier: Diagnosis of  By: [name] Ambien 10 mg daily does not keep asleep AdvilPM is over sedating and has trouble waking up    Iron deficiency anemia 2011   Low testosterone 06/22/2016   Malignant neoplasm of colon ([place]) 01/02/2010   Qualifier: Diagnosis of  By: [name] Dr [name]    Muscle spasm of back 06/05/2014   Nipple pain 09/30/2016   Obesity    Palpitations 06/05/2014   Preventative health care 10/08/2012   Psoriasis    Psoriatic arthritis ([place]) 08/25/2017   Right knee pain 11/17/2011   BP 126/79 (BP Location: Right Arm, Patient Position: Sitting, Cuff Size: Large)   Pulse 82   Temp 97.8 F (36.6 C) (Oral)   Ht 5\' 8"  (1.727 m)   Wt 220 lb (99.8 kg)   SpO2 97%   BMI 33.45 kg/m   Opioid Risk Score:   Fall Risk Score:  `1  Depression screen PHQ 2/9  Depression screen [facility] 2/9 01/08/2021 12/25/2020 11/11/2020 11/10/2020 08/18/2020 01/31/2020 04/18/2017  Decreased Interest 1 0 2 0 0 0 0  Down,  Depressed, Hopeless 1 1 2 1  0 0 0  PHQ - 2 Score 2 1 4 1  0 0 0  Altered sleeping - - 3 - 3 2 0  Tired, decreased energy - - 2 - 0 0 0  Change in appetite - - 2 - 0 0 0  Feeling bad or failure about yourself  - - 0 - 0 0 0  Trouble concentrating - - 2 - 0 0 0  Moving slowly or fidgety/restless - - 2 - 0 0 0  Suicidal thoughts - - 0 - 0 0 0  PHQ-9 Score - - 15 - 3 2 0  Difficult doing work/chores - - - - - Not difficult at all Not difficult at all  Some recent data might be hidden      Review of Systems  Constitutional: Negative.   HENT: Negative.    Eyes: Negative.   Respiratory: Negative.    Cardiovascular: Negative.   Gastrointestinal: Negative.   Endocrine: Negative.   Genitourinary: Negative.   Musculoskeletal: Negative.   Skin: Negative.   Allergic/Immunologic: Negative.   Neurological:  Positive for numbness.       On left thumb only  Hematological: Negative.   Psychiatric/Behavioral: Negative.        Objective:   Physical Exam Vitals and nursing note reviewed.  Constitutional:      Appearance: He is obese.     Comments: Patient has lost several pounds since last visit, dieting and exercising more  HENT:     Head: Normocephalic and atraumatic.  Eyes:     General: Gaze aligned appropriately. No visual field deficit.    Extraocular Movements: Extraocular movements intact.     Right eye: No nystagmus.     Left eye: No nystagmus.     Conjunctiva/sclera: Conjunctivae normal.     Right eye: No hemorrhage.  Left eye: No hemorrhage.    Pupils: Pupils are equal, round, and reactive to light.     Comments: No visual field cuts  Neurological:     Mental Status: He is alert.     Cranial Nerves: No dysarthria.     Coordination: Romberg sign negative. Coordination normal.     Gait: Gait and tandem walk normal.     Comments: Neuro:  Eyes without evidence of nystagmus  Tone is normal without evidence of spasticity Cerebellar exam shows no evidence of ataxia on finger  nose finger or heel to shin testing No evidence of trunkal ataxia  Motor strength is 5/5 in bilateral deltoid, biceps, triceps, finger flexors and extensors, wrist flexors and extensors, hip flexors, knee flexors and extensors, ankle dorsiflexors, plantar flexors, invertors and evertors, toe flexors and extensors  Sensory exam is normal to pinprick, proprioception and light touch in the upper and lower limbs   Cranial nerves II- Visual fields are intact to confrontation testing, no blurring of vision III- no evidence of ptosis, upward, downward and medial gaze intact IV- no vertical diplopia or head tilt V- no facial numbness or masseter weakness VI- no pupil abduction weakness VII- no facial droop, good lid closure VII- normal auditory acuity IX- no pharygeal weakness, gag nl X- no pharyngeal weakness, no hoarseness XI- no trap or SCM weakness XII- no glossal weakness             Assessment & Plan:  1.  History of left thalamic and right cerebellar strokes no significant residual neurologic deficits.  Have recommended follow-up with neurology for secondary stroke prevention. The patient is independent, he may return to his previous job as of 620 10/2020 without restrictions, full-time Time from this visit was spent reviewing job requirements, also wrote a letter for him to take back to his employer. Physical medicine rehab follow-up as needed Follow-up with primary care for general medical

## 2021-01-08 NOTE — Patient Instructions (Signed)
Please contact me if other paperwork is needed.

## 2021-01-20 NOTE — Progress Notes (Signed)
Carelink Summary Report / Loop Recorder 

## 2021-01-22 ENCOUNTER — Ambulatory Visit (INDEPENDENT_AMBULATORY_CARE_PROVIDER_SITE_OTHER): Payer: Commercial Managed Care - PPO | Admitting: Adult Health

## 2021-01-22 ENCOUNTER — Other Ambulatory Visit: Payer: Self-pay

## 2021-01-22 ENCOUNTER — Encounter: Payer: Self-pay | Admitting: Adult Health

## 2021-01-22 VITALS — BP 109/64 | HR 80 | Ht 68.0 in | Wt 210.4 lb

## 2021-01-22 DIAGNOSIS — F0630 Mood disorder due to known physiological condition, unspecified: Secondary | ICD-10-CM

## 2021-01-22 DIAGNOSIS — R269 Unspecified abnormalities of gait and mobility: Secondary | ICD-10-CM

## 2021-01-22 DIAGNOSIS — I10 Essential (primary) hypertension: Secondary | ICD-10-CM

## 2021-01-22 DIAGNOSIS — I639 Cerebral infarction, unspecified: Secondary | ICD-10-CM

## 2021-01-22 DIAGNOSIS — I69398 Other sequelae of cerebral infarction: Secondary | ICD-10-CM

## 2021-01-22 DIAGNOSIS — E785 Hyperlipidemia, unspecified: Secondary | ICD-10-CM

## 2021-01-22 DIAGNOSIS — E1165 Type 2 diabetes mellitus with hyperglycemia: Secondary | ICD-10-CM

## 2021-01-22 NOTE — Patient Instructions (Signed)
Ensure you follow up with your PCP regarding anxiety/depression - ensure that you take your sertraline (Zoloft) on a daily basis as intermittent use can worsen your anxiety/depression  Would recommend therapy for residual imbalance and cognitive difficulties - please let me know if you would like to do this   Your loop recorder has not shown atrial fibrillation thus far - will continue to be monitored by cardiology   Continue aspirin 81 mg daily and Brilinta (ticagrelor) 90 mg bid  and Zetia  for secondary stroke prevention  Continue to follow up with PCP regarding cholesterol, blood pressure and diabetes management  Maintain strict control of hypertension with blood pressure goal below 130/90, diabetes with hemoglobin A1c goal below 7% and cholesterol with LDL cholesterol (bad cholesterol) goal below 70 mg/dL.       Followup in the future with me in 6 months or call earlier if needed       Thank you for coming to see [place] at [facility]. I hope we have been able to provide you high quality care today.  You may receive a patient satisfaction survey over the next few weeks. We would appreciate your feedback and comments so that we may continue to improve ourselves and the health of our patients.

## 2021-01-22 NOTE — Progress Notes (Signed)
[facility] [address] [place]. [place] [address] [phone]       STROKE FOLLOW UP NOTE  [name] Date of Birth:  1960/11/29 Medical Record Number:  [id number]   Reason for Referral: stroke follow up    SUBJECTIVE:   CHIEF COMPLAINT:  Chief Complaint  Patient presents with   Stroke    Rm 2, 3 month FU     HPI:   Today, 01/22/2021, [name] returns for stroke follow-up and to further discuss Social Security disability.  He attempted to return back to work on 6/24 but unfortunately was let go on 7/1 due to making mistakes per patient report.  He continued to struggle with imbalance and gait difficulties which he reports led to making mistakes.  He is currently in the process of applying for Social Security disability as he feels he will have great difficulty performing majority of job duties due to residual gait impairment with imbalance, fatigue with activity intolerance, short-term memory complaints, poststroke depression and anxiety as well as history of prior MI.  He reports today that he did not feel completely ready to return back to work and needed but that he had no choice due to financial reasons.  He has been able to maintain ADLs and IADLs at home with occasional difficulty.  Continues to struggle with depression and anxiety and feels as though this has been worsening of recently -PCP increase sertraline to 50mg  daily since prior visit but he will only take on occasion.  He ensures that he takes Brilinta, aspirin and "one of my BP meds" everyday as he will place these in a container but he does not always routinely take his other medications. Blood pressure today 109/64 -does not routinely monitor.  Loop recorder has not shown atrial fibrillation thus far.  No further concerns at this time.    History provided for reference purposes only Initial visit 11/10/2020 [name] is being seen for hospital follow-up accompanied by his friend,  [name]  Reports residual imbalance, right sided weakness, memory loss, and blurred vision  Per patient, was told he would likely not benefit from [facility] therapies as he was able to maintain ADLs independently -he does report doing daily exercises at home -currently using cane and denies any recent falls Reports increased anxiety with recent stroke with residual deficits and lack of ability to return to work- use of diazepam per PCP but not beneficial - requesting a different medication to further help with this Able to maintain ADLs and IADLs independently.  He has not returned back to work or driving.  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  He does not have coverage for short-term disability - looking at applying for long term disability.  Denies new stroke/TIA symptoms  Compliant on aspirin and Brilinta without associated side effects Unable to tolerate pravastatin due to lower extremity myalgias- not currently on any type of therapy  Blood pressure today 125/72 - does not routinely monitor at home  Loop recorder has not shown atrial fibrillation thus far   No further concerns at this time  Stroke admission 11/05/2020 [name] is a 60 y.o. male with history of diabetes, hypertension, hyperlipidemia, obesity, colon cancer, CAD stent MI status post stenting 10/2019 on aspirin and Brilinta who was admitted on 11/05/2020 for imbalance and falling.  Personally reviewed hospitalization pertinent progress notes, lab work and imaging summary provided.  Stroke  work-up revealed left thalamic and posterior CR, bilateral occipital punctate, and right cerebellum small infarcts, embolic pattern secondary to unclear source s/p ILR.  On aspirin and Brilinta PTA and recommended continuation at discharge.  Uncontrolled DM with A1c 8.7.  HTN stable.  LDL 118 with history of statin intolerance and initiated pravastatin 20 mg daily. No  prior stroke history.  Evaluated by therapies and recommended discharge to CIR for ongoing therapy needs with residual decreased functional mobility with imbalance and mild right-sided weakness.  Stroke: Left thalamic and posterior CR, bilateral occipital punctate, right cerebellum small infarcts, embolic pattern, secondary to unclear source Resultant right sided impaired coordination CT no acute abnormality CTA head and neck left P2 severe stenosis, bilateral ICA bulb and siphon atherosclerosis MRI left thalamic and left posterior CR infarcts, punctate bilateral occipital and right cerebellar infarcts. 2D Echo EF 55 to 60% LE venous Doppler no DVT TEE unremarkable, no PFO, EF 55-60% Loop recorder placed LDL 118 HgbA1c 8.7 Hypercoagulable work-up negative  SCDs for VTE prophylaxis aspirin 81 mg daily and Brilinta (ticagrelor) 90 mg bid prior to admission, now on aspirin 81 mg daily and Brilinta (ticagrelor) 90 mg bid.  Continue on discharge. Patient counseled to be compliant with his antithrombotic medications Ongoing aggressive stroke risk factor management Therapy recommendations: CIR Disposition: d/c to CIR on 4/5      ROS:   14 system review of systems performed and negative with exception of those listed in HPI  PMH:  Past Medical History:  Diagnosis Date   Anemia 10/03/2009   Qualifier: Diagnosis of  By: [name]    Anxiety associated with depression 10/11/2013   Colon cancer [facility])    right colon cancer- adenocarcinoma CEA level isnrmal at 1.8   Diabetes mellitus    Diabetes mellitus type 2 in obese ([place]) 03/20/2010   Qualifier: Diagnosis of  By: [name]     Erectile dysfunction 06/22/2016   Essential hypertension 06/05/2014   FATTY LIVER DISEASE 11/04/2009   Qualifier: Diagnosis of  By: [name] MD, Docia Chuck   Qualifier: Diagnosis of  By: [name] MD, Docia Chuck  Last Assessment & Plan:  conirmed by CT scan of abdomen in April 2016 encouraged to minimize simple carbs  and fatty foods.   GERD 11/04/2009   Qualifier: Diagnosis of  By: [name] MD, Docia Chuck    Great toe pain, right 04/18/2017   Hepatic artery stenosis [facility])    History of colon cancer 01/02/2010   Qualifier: Diagnosis of  By: [name] Dr [name]  Last Assessment & Plan:  Follows closely with gastroenterology, Dr [name]   Hyperlipidemia    Hyperlipidemia, mixed 09/07/2010   Qualifier: Diagnosis of  By: [name]    Hypertension    Hypertriglyceridemia 12/14/2010   Hypotestosteronism 06/22/2016   INSOMNIA, CHRONIC 03/20/2010   Qualifier: Diagnosis of  By: [name] Ambien 10 mg daily does not keep asleep [place] is over sedating and has trouble waking up    Iron deficiency anemia 2011   Low testosterone 06/22/2016   Malignant neoplasm of colon ([place]) 01/02/2010   Qualifier: Diagnosis of  By: [name] Dr [name]    Muscle spasm of back 06/05/2014   Nipple pain 09/30/2016   Obesity    Palpitations 06/05/2014   Preventative health care 10/08/2012   Psoriasis    Psoriatic arthritis ([place]) 08/25/2017   Right knee pain 11/17/2011    PSH:  Past Surgical History:  Procedure Laterality Date   BUBBLE STUDY  10/21/2020   Procedure: BUBBLE STUDY;  Surgeon: [name], MD;  Location: [facility];  Service: Cardiovascular;;   CARPAL TUNNEL RELEASE     RIGHT   CHOLECYSTECTOMY  1994   COLONOSCOPY     CORONARY/GRAFT ACUTE MI REVASCULARIZATION N/A 10/27/2019   Procedure: Coronary/Graft Acute MI Revascularization;  Surgeon: [name], MD;  Location: [facility] CV LAB;  Service: Cardiovascular;  Laterality: N/A;   HEMICOLECTOMY     12/17/2009 right   LEFT HEART CATH AND CORONARY ANGIOGRAPHY N/A 10/27/2019   Procedure: LEFT HEART CATH AND CORONARY ANGIOGRAPHY;  Surgeon: [name], MD;  Location: [place] CV LAB;  Service: Cardiovascular;  Laterality: N/A;   LOOP RECORDER INSERTION N/A 10/21/2020   Procedure: LOOP RECORDER INSERTION;  Surgeon: [name], MD;   Location: [place] CV LAB;  Service: Cardiovascular;  Laterality: N/A;   POLYPECTOMY     TEE WITHOUT CARDIOVERSION N/A 10/21/2020   Procedure: TRANSESOPHAGEAL ECHOCARDIOGRAM (TEE);  Surgeon: [name], MD;  Location: [facility] ENDOSCOPY;  Service: Cardiovascular;  Laterality: N/A;    Social History:  Social History   Socioeconomic History   Marital status: Single    Spouse name: [name]   Number of children: 0   Years of education: Not on file   Highest education level: Not on file  Occupational History   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  Tobacco Use   Smoking status: Former    Pack years: 0.00    Types: Cigarettes    Quit date: 10/27/2019    Years since quitting: 1.2   Smokeless tobacco: Never  Vaping Use   Vaping Use: Never used  Substance and Sexual Activity   Alcohol use: No   Drug use: No   Sexual activity: Yes    Comment: lives with girlfriend, no dietary restrictions  Other Topics Concern   Not on file  Social History Narrative   Occupation: Merchant navy officer (Recruitment consultant)   Single  (lives with [name])   no children    former smoker   Illicit Drug Use - no    Social Determinants of Radio broadcast assistant Strain: Not on file  Food Insecurity: Not on file  Transportation Needs: Not on file  Physical Activity: Not on file  Stress: Not on file  Social Connections: Not on file  Intimate Partner Violence: Not on file    Family History:  Family History  Problem Relation Age of Onset   Stomach cancer Mother        diedinher 60's   Diabetes type II Brother        boderline   Diabetes Brother        type II   Alcohol abuse Brother    Other Neg Hx        cad,prostate ca, colon ca   Coronary artery disease Neg Hx    Cancer Neg Hx        colon, prostate   Colon cancer Neg Hx    Esophageal cancer Neg Hx    Rectal cancer Neg Hx     Medications:   Current Outpatient Medications on File Prior to Visit  Medication Sig Dispense Refill    aspirin EC 81 MG tablet Take 1 tablet (81 mg total) by mouth daily. 90 tablet 3   clobetasol cream (TEMOVATE) 0.53 % Apply 1 application topically at bedtime as needed (psoriasis).  clonazePAM (KLONOPIN) 1 MG tablet Take 1 tablet (1 mg total) by mouth 2 (two) times daily as needed for anxiety. 30 tablet 2   ezetimibe (ZETIA) 10 MG tablet Take 1 tablet (10 mg total) by mouth daily. 30 tablet 5   folic acid (FOLVITE) 1 MG tablet Take 1 tablet (1 mg total) by mouth daily. 30 tablet 0   glucose blood (CONTOUR NEXT TEST) test strip Use to check blood sugar tid.  Dx code: E11.9 300 each 1   insulin glargine (LANTUS) 100 UNIT/ML Solostar Pen Inject 43 Units into the skin daily. 15 mL 11   Ixekizumab 80 MG/ML SOSY Inject 80 mg into the skin every 28 (twenty-eight) days.     losartan (COZAAR) 25 MG tablet Take 0.5 tablets (12.5 mg total) by mouth daily. 30 tablet 0   melatonin 5 MG TABS Take 1 tablet (5 mg total) by mouth at bedtime. 30 tablet 0   methotrexate (RHEUMATREX) 2.5 MG tablet Take 2.5 mg by mouth once a week. Takes 6 tabs weekly on Sundays.     metoprolol succinate (TOPROL XL) 25 MG 24 hr tablet Take 1 tablet (25 mg total) by mouth daily. 30 tablet 0   Multiple Vitamins-Minerals (MULTIVITAMIN ADULT, MINERALS,) TABS Take 1 tablet by mouth daily. 30 tablet 3   potassium chloride (KLOR-CON) 10 MEQ tablet Take 1 tablet (10 mEq total) by mouth daily. 7 tablet 0   predniSONE (DELTASONE) 5 MG tablet Take 1 tablet by mouth daily as needed (psoriasis).     sertraline (ZOLOFT) 50 MG tablet 1/2 tab po daily x 7 days and then increase to 1 tab daily 30 tablet 3   ticagrelor (BRILINTA) 90 MG TABS tablet TAKE 1 TABLET(90 MG) BY MOUTH TWICE DAILY 90 tablet 0   Vitamin D, Ergocalciferol, (DRISDOL) 1.25 MG (50000 UNIT) CAPS capsule Take 1 capsule (50,000 Units total) by mouth every 7 (seven) days. 4 capsule 4   zolpidem (AMBIEN) 10 MG tablet Take 10 mg by mouth at bedtime as needed.     HUMALOG KWIKPEN 100  UNIT/ML KwikPen SMARTSIG:15 Unit(s) SUB-Q 3 Times Daily     No current facility-administered medications on file prior to visit.    Allergies:   Allergies  Allergen Reactions   Statins     Causes muscle weakness       OBJECTIVE:  Physical Exam  Vitals:   01/22/21 0931  BP: 109/64  Pulse: 80  Weight: 210 lb 6.4 oz (95.4 kg)  Height: 5\' 8"  (1.727 m)    Body mass index is 31.99 kg/m. No results found.  General: well developed, well nourished, middle-age Caucasian male, seated, in no evident distress Head: head normocephalic and atraumatic.   Neck: supple with no carotid or supraclavicular bruits Cardiovascular: regular rate and rhythm, no murmurs Musculoskeletal: no deformity Skin:  no rash/petichiae Vascular:  Normal pulses all extremities   Neurologic Exam Mental Status: Awake and fully alert.   Fluent speech and language.  Oriented to place and time. Recent memory subjectively impaired and remote memory intact. Attention span, concentration and fund of knowledge appropriate during visit. Mood and affect mostly appropriate but would become irritable quickly.  Cranial Nerves: Pupils equal, briskly reactive to light. Extraocular movements full without nystagmus. Visual fields full to confrontation. Hearing intact. Facial sensation intact. Face, tongue, palate moves normally and symmetrically.  Motor: Normal bulk and tone. Normal strength in all tested extremity muscles Sensory.: intact to touch , pinprick , position and vibratory sensation.  Coordination:  Rapid alternating movements normal in all extremities. Finger-to-nose and heel-to-shin performed accurately on left side with mild incoordination on right side. Gait and Station: Arises from chair without difficulty. Stance is normal. Gait demonstrates normal stride length and mild to moderate imbalance especially with turns with use of cane.  Difficulty performing tandem walk and heel toe.  Romberg negative Reflexes: 1+  and symmetric. Toes downgoing.          ASSESSMENT: [name] is a 60 y.o. year old male presented with imbalance and falling on 10/18/2020 with stroke work-up revealing left thalamic and posterior CR, bilateral occipital punctate and right cerebellum small infarcts, embolic pattern secondary to unclear source s/p ILR placement. Vascular risk factors include HTN, HLD, DM, CAD/MI s/p stent 10/2019, and obesity.      PLAN:  Cryptogenic stroke:  Residual deficit: gait impairment with imbalance/unsteadiness, short-term memory difficulties and depression/anxiety.  Unfortunately, he attempted to return back to work but was let go 2 weeks later due to making errors.  He is currently pursuing Social Security disability -he would likely have difficulty performing majority of job types and requirements mainly due to his gait impairment with unsteadiness/imbalance.  I do believe that he would benefit from participating in therapies (which he previously declined) but now this will be difficult as he will be losing his insurance at the end of the month.  Discussed certain exercises at home as well as memory exercises.  Post stroke anxiety: Discussed use of sertraline 50 mg on a daily basis as intermittent use will not be of any benefit and could possibly worsen anxiety.  He would likely greatly benefit from seeing behavioral health but unfortunately, he will be losing his insurance at the end of the month.  Continue to follow with PCP for monitoring and management Loop recorder has not shown atrial fibrillation thus far - will continue to be monitored by cardiology Continue aspirin 81 mg daily and Brilinta (ticagrelor) 90 mg bid  and Zetia  for secondary stroke prevention.   Discussed secondary stroke prevention measures and importance of close PCP follow up for aggressive stroke risk factor management.  Also discussed importance of medication compliance with all prescribed medications (see HPI) HTN: BP  goal <130/90.  Stable on current regimen per PCP HLD: LDL goal <70. Prior LDL 118 -continue Zetia 10 mg daily - he plans to f/u with PCP before the end of the month to obtain repeat lab work.  History of intolerance to atorvastatin, Crestor and pravastatin  DMII: A1c goal<7.0.  Prior A1c 8.7.  Monitored by PCP    Follow up in 6 months or call earlier if needed   CC:  [facility] provider: Dr. Leonie Man PCP: Mosie Lukes, MD    I spent a prolonged >60 minutes of face-to-face and non-face-to-face time with patient.  This included previsit chart review, lab review, study review, electronic health record documentation, patient education and discussion regarding history of prior stroke including etiology and monitoring of ILR as well as secondary stroke prevention measures and importance of aggressive stroke risk factor management, importance of medication compliance, residual deficits and possible further recovery, pursuing Social Security disability, continued anxiety/depression and answered all other questions to patients satisfaction  Frann Rider, AGNP-BC  [facility] [address] [place], [place] [address]  Phone [phone] Fax [phone] Note: This document was prepared with digital dictation and possible smart phrase technology. Any transcriptional errors that result from this process are unintentional.

## 2021-01-26 ENCOUNTER — Ambulatory Visit: Payer: Commercial Managed Care - PPO | Admitting: Physician Assistant

## 2021-01-27 NOTE — Progress Notes (Signed)
I agree with the above plan 

## 2021-01-28 ENCOUNTER — Other Ambulatory Visit: Payer: Self-pay

## 2021-01-28 ENCOUNTER — Ambulatory Visit: Payer: Commercial Managed Care - PPO | Admitting: Pharmacy Technician

## 2021-01-28 DIAGNOSIS — Z79899 Other long term (current) drug therapy: Secondary | ICD-10-CM

## 2021-01-28 NOTE — Progress Notes (Signed)
Met with patient completed financial assistance application for [place] due to recent provider visit.  Patient to notify when receiving a bill.  Then financial information will be forwarded to appropriate department in [facility].    Completed [facility] application and contract.  Patient agreed to all terms of the [facility] contract.    Patient approved to receive medication assistance at [facility] until time for re-certification in 0488, and as long as eligibility criteria continues to be met.    Provided patient with Civil engineer, contracting based on his particular needs.    [facility]

## 2021-01-29 LAB — CUP PACEART REMOTE DEVICE CHECK
Date Time Interrogation Session: 20220713163320
Implantable Pulse Generator Implant Date: 20220405

## 2021-01-30 ENCOUNTER — Other Ambulatory Visit: Payer: Self-pay

## 2021-02-02 ENCOUNTER — Other Ambulatory Visit: Payer: Self-pay

## 2021-02-02 ENCOUNTER — Ambulatory Visit (INDEPENDENT_AMBULATORY_CARE_PROVIDER_SITE_OTHER): Payer: Commercial Managed Care - PPO

## 2021-02-02 DIAGNOSIS — I639 Cerebral infarction, unspecified: Secondary | ICD-10-CM

## 2021-02-09 ENCOUNTER — Other Ambulatory Visit: Payer: Self-pay

## 2021-02-09 ENCOUNTER — Encounter (HOSPITAL_BASED_OUTPATIENT_CLINIC_OR_DEPARTMENT_OTHER): Payer: Self-pay | Admitting: Family

## 2021-02-09 ENCOUNTER — Telehealth: Payer: Self-pay | Admitting: *Deleted

## 2021-02-09 ENCOUNTER — Encounter (HOSPITAL_BASED_OUTPATIENT_CLINIC_OR_DEPARTMENT_OTHER): Payer: Self-pay

## 2021-02-09 ENCOUNTER — Ambulatory Visit (INDEPENDENT_AMBULATORY_CARE_PROVIDER_SITE_OTHER): Payer: Self-pay | Admitting: Family

## 2021-02-09 VITALS — BP 104/70 | HR 78 | Ht 68.0 in | Wt 205.0 lb

## 2021-02-09 DIAGNOSIS — T466X5A Adverse effect of antihyperlipidemic and antiarteriosclerotic drugs, initial encounter: Secondary | ICD-10-CM

## 2021-02-09 DIAGNOSIS — M791 Myalgia, unspecified site: Secondary | ICD-10-CM

## 2021-02-09 DIAGNOSIS — I10 Essential (primary) hypertension: Secondary | ICD-10-CM

## 2021-02-09 DIAGNOSIS — E785 Hyperlipidemia, unspecified: Secondary | ICD-10-CM

## 2021-02-09 DIAGNOSIS — I255 Ischemic cardiomyopathy: Secondary | ICD-10-CM

## 2021-02-09 DIAGNOSIS — Z8673 Personal history of transient ischemic attack (TIA), and cerebral infarction without residual deficits: Secondary | ICD-10-CM

## 2021-02-09 MED ORDER — LOSARTAN POTASSIUM 25 MG PO TABS
25.0000 mg | ORAL_TABLET | Freq: Every day | ORAL | 3 refills | Status: DC
  Filled 2021-02-09: qty 90, 90d supply, fill #0

## 2021-02-09 MED ORDER — ASPIRIN 81 MG PO TBEC
81.0000 mg | DELAYED_RELEASE_TABLET | Freq: Every day | ORAL | 3 refills | Status: DC
  Filled 2021-02-09: qty 90, 90d supply, fill #0

## 2021-02-09 MED ORDER — VITAMIN D (ERGOCALCIFEROL) 1.25 MG (50000 UNIT) PO CAPS
50000.0000 [IU] | ORAL_CAPSULE | ORAL | 4 refills | Status: DC
  Filled 2021-02-09: qty 4, 28d supply, fill #0
  Filled 2021-03-13 – 2021-07-14 (×2): qty 4, 28d supply, fill #1

## 2021-02-09 MED ORDER — BASAGLAR KWIKPEN 100 UNIT/ML ~~LOC~~ SOPN
43.0000 [IU] | PEN_INJECTOR | Freq: Every day | SUBCUTANEOUS | 1 refills | Status: DC
  Filled 2021-02-09: qty 15, 35d supply, fill #0
  Filled 2021-03-13: qty 15, 35d supply, fill #1
  Filled 2021-04-09: qty 15, 35d supply, fill #2
  Filled 2021-05-13: qty 15, 35d supply, fill #3
  Filled 2021-06-16: qty 15, 35d supply, fill #4

## 2021-02-09 MED ORDER — EZETIMIBE 10 MG PO TABS
10.0000 mg | ORAL_TABLET | Freq: Every day | ORAL | 5 refills | Status: DC
  Filled 2021-02-09: qty 30, 30d supply, fill #0
  Filled 2021-03-13: qty 30, 30d supply, fill #1

## 2021-02-09 MED ORDER — BRILINTA 90 MG PO TABS
90.0000 mg | ORAL_TABLET | Freq: Two times a day (BID) | ORAL | 1 refills | Status: DC
  Filled 2021-02-09: qty 60, 30d supply, fill #0
  Filled 2021-03-13: qty 30, 15d supply, fill #1

## 2021-02-09 MED ORDER — INSULIN PEN NEEDLE 32G X 4 MM MISC
11 refills | Status: DC
  Filled 2021-02-09: qty 100, 25d supply, fill #0
  Filled 2021-03-13 (×2): qty 100, 25d supply, fill #1
  Filled 2021-06-16: qty 100, 25d supply, fill #2
  Filled 2021-07-14: qty 100, 25d supply, fill #3

## 2021-02-09 MED ORDER — METOPROLOL SUCCINATE ER 25 MG PO TB24
25.0000 mg | ORAL_TABLET | Freq: Every day | ORAL | 5 refills | Status: DC
  Filled 2021-02-09: qty 30, 30d supply, fill #0
  Filled 2021-03-13 – 2021-07-14 (×2): qty 30, 30d supply, fill #1

## 2021-02-09 MED ORDER — ROSUVASTATIN CALCIUM 10 MG PO TABS
10.0000 mg | ORAL_TABLET | Freq: Every day | ORAL | 3 refills | Status: DC
  Filled 2021-02-09: qty 90, 90d supply, fill #0

## 2021-02-09 MED ORDER — FOLIC ACID 1 MG PO TABS
1.0000 mg | ORAL_TABLET | Freq: Every day | ORAL | 2 refills | Status: DC
  Filled 2021-02-09: qty 90, 90d supply, fill #0

## 2021-02-09 MED ORDER — INSULIN LISPRO (1 UNIT DIAL) 100 UNIT/ML (KWIKPEN)
15.0000 [IU] | PEN_INJECTOR | Freq: Three times a day (TID) | SUBCUTANEOUS | 3 refills | Status: DC
Start: 2020-09-23 — End: 2021-06-16
  Filled 2021-02-09: qty 15, 34d supply, fill #0
  Filled 2021-03-13: qty 15, 34d supply, fill #1

## 2021-02-09 MED ORDER — SERTRALINE HCL 50 MG PO TABS
50.0000 mg | ORAL_TABLET | Freq: Every day | ORAL | 3 refills | Status: DC
  Filled 2021-02-09: qty 30, 30d supply, fill #0

## 2021-02-09 NOTE — Progress Notes (Signed)
Office Visit    Patient Name: [name] Date of Encounter: 02/09/2021  PCP:  Mosie Lukes, MD   [facility]  Cardiologist:  Kathlyn Sacramento, MD  Advanced Practice Provider:  No care team member to display Electrophysiologist:  [name], MD   Chief Complaint    [name] is a 60 y.o. male with a hx of CVA, CAD, DM2, HTN, HLD, ICM, tobacco use presents today for follow up after CVA   Past Medical History    Past Medical History:  Diagnosis Date   Anemia 10/03/2009   Qualifier: Diagnosis of  By: [name]    Anxiety associated with depression 10/11/2013   Colon cancer ([place])    right colon cancer- adenocarcinoma CEA level isnrmal at 1.8   Diabetes mellitus    Diabetes mellitus type 2 in obese ([place]) 03/20/2010   Qualifier: Diagnosis of  By: [name]     Erectile dysfunction 06/22/2016   Essential hypertension 06/05/2014   FATTY LIVER DISEASE 11/04/2009   Qualifier: Diagnosis of  By: [name] MD, Docia Chuck   Qualifier: Diagnosis of  By: [name] MD, Docia Chuck  Last Assessment & Plan:  conirmed by CT scan of abdomen in April 2016 encouraged to minimize simple carbs and fatty foods.   GERD 11/04/2009   Qualifier: Diagnosis of  By: [name] MD, Docia Chuck    Great toe pain, right 04/18/2017   Hepatic artery stenosis [facility])    History of colon cancer 01/02/2010   Qualifier: Diagnosis of  By: [name] Dr [name]  Last Assessment & Plan:  Follows closely with gastroenterology, Dr [name]   Hyperlipidemia    Hyperlipidemia, mixed 09/07/2010   Qualifier: Diagnosis of  By: [name]    Hypertension    Hypertriglyceridemia 12/14/2010   Hypotestosteronism 06/22/2016   INSOMNIA, CHRONIC 03/20/2010   Qualifier: Diagnosis of  By: [name] Ambien 10 mg daily does not keep asleep AdvilPM is over sedating and has trouble waking up    Iron deficiency anemia 2011   Low testosterone 06/22/2016   Malignant neoplasm of colon ([place]) 01/02/2010    Qualifier: Diagnosis of  By: [name] Dr [name]    Muscle spasm of back 06/05/2014   Nipple pain 09/30/2016   Obesity    Palpitations 06/05/2014   Preventative health care 10/08/2012   Psoriasis    Psoriatic arthritis ([place]) 08/25/2017   Right knee pain 11/17/2011   Past Surgical History:  Procedure Laterality Date   BUBBLE STUDY  10/21/2020   Procedure: BUBBLE STUDY;  Surgeon: [name], MD;  Location: [place];  Service: Cardiovascular;;   CARPAL TUNNEL RELEASE     RIGHT   CHOLECYSTECTOMY  1994   COLONOSCOPY     CORONARY/GRAFT ACUTE MI REVASCULARIZATION N/A 10/27/2019   Procedure: Coronary/Graft Acute MI Revascularization;  Surgeon: [name], MD;  Location: [place] [facility];  Service: Cardiovascular;  Laterality: N/A;   HEMICOLECTOMY     12/17/2009 right   LEFT HEART CATH AND CORONARY ANGIOGRAPHY N/A 10/27/2019   Procedure: LEFT HEART CATH AND CORONARY ANGIOGRAPHY;  Surgeon: [name], MD;  Location: [place] [facility];  Service: Cardiovascular;  Laterality: N/A;   LOOP RECORDER INSERTION N/A 10/21/2020   Procedure: LOOP RECORDER INSERTION;  Surgeon: [name], MD;  Location: [place] [facility];  Service: Cardiovascular;  Laterality: N/A;   POLYPECTOMY  TEE WITHOUT CARDIOVERSION N/A 10/21/2020   Procedure: TRANSESOPHAGEAL ECHOCARDIOGRAM (TEE);  Surgeon: [name], MD;  Location: [facility];  Service: Cardiovascular;  Laterality: N/A;    Allergies  Allergies  Allergen Reactions   Statins     Causes muscle weakness     History of Present Illness    [name] is a 60 y.o. male with a hx of CVA, CAD, DM2, HTN, HLD, ICM, tobacco use last seen 10/31/2020 by Oda Kilts, PA for wound check after loop recorder.  He presented April 2021 with anterior STEMI.  He underwent emergent cardiac catheterization showing occluded proximal LAD treated with PCI and DES by Dr. Sophronia Simas.  There is residual nonocclusive thrombus in first diagonal  treated with Aggrastat infusion for 18 hours.  Echocardiogram with LVEF 35 to 40%.  He did not tolerate prasugrel and was switched to clopidogrel.  Clinic visit July 2021 he was transition from carvedilol to Toprol due to persistent tachycardia and shortness of breath.  He had myalgias with atorvastatin which improved after switching to rosuvastatin.  He was transition from Plavix to Brilinta as he perceives the Plavix is aggravating his psoriasis arthritis.  He has had some difficulty with medication compliance.  He was admitted 10/18/20 with acute CVA in the setting of noncompliance with antihypertensive regimen.  Imaging showed left posterior thalamic punctate to it cerebellar infarcts.  He was discharged on aspirin and Brilinta per neurology.  He was started on pravastatin due to previous myalgias. He had loop recorder inserted 10/21/2020 by Dr. Rayann Heman.  Transesophageal echocardiogram 10/21/2020 with no cardiac source of embolus, no LAA thrombus, negative for PFO, trivial MR, mild LVH, moderate LAE, LVEF 55 to 60%.  He was discharged to inpatient rehab.  He presents today for follow-up with a friend. 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 He is working with a Chief Executive Officer to get disability. Still some difficulties with walking and memory from CVA. Blood pressure at home has been well controlled per his report but cannot recall readings. Tells me he can feel his heart pounding and is bothered by this. Not describes as a pain, but more of a palpitations. Tells me he this was "gone" when he got home from the hospital but has recurred. Not currently taking Metoprolol nor Losartan. The only thing he is taking right now is Brilinta and Aspirin. He had trouble with all of the statins he has tried including Atorvastatin, Rosuvastatin, Pravastatin.   EKGs/Labs/Other Studies Reviewed:   The following studies were reviewed today:  EKG:  No EKG today  Recent  Labs: 10/20/2020: TSH 2.197 12/25/2020: ALT 25; BUN 12; Creatinine, Ser 0.77; Hemoglobin 14.8; Platelets 277.0; Potassium 4.0; Sodium 139  Recent Lipid Panel    Component Value Date/Time   CHOL 196 10/19/2020 0116   CHOL 949 (H) 10/27/2014 1322   TRIG 254 (H) 10/19/2020 0116   TRIG 1,098 (H) 10/30/2014 0605   HDL 27 (L) 10/19/2020 0116   HDL 20 (L) 10/27/2014 1322   CHOLHDL 7.3 10/19/2020 0116   VLDL 51 (H) 10/19/2020 0116   VLDL SEE COMMENT 10/27/2014 1322   LDLCALC 118 (H) 10/19/2020 0116   LDLCALC SEE COMMENT 10/27/2014 1322   LDLDIRECT 64.0 08/18/2020 1029   Home Medications   Current Meds  Medication Sig   aspirin EC 81 MG tablet Take 1 tablet (81 mg total) by mouth daily.   clonazePAM (KLONOPIN) 1 MG tablet Take 1  tablet (1 mg total) by mouth 2 (two) times daily as needed for anxiety.   glucose blood (CONTOUR NEXT TEST) test strip Use to check blood sugar tid.  Dx code: E11.9   HUMALOG KWIKPEN 100 UNIT/ML KwikPen SMARTSIG:15 Unit(s) SUB-Q 3 Times Daily   insulin glargine (LANTUS) 100 UNIT/ML Solostar Pen Inject 43 Units into the skin daily.   Ixekizumab 80 MG/ML SOSY Inject 80 mg into the skin every 28 (twenty-eight) days.   melatonin 5 MG TABS Take 1 tablet (5 mg total) by mouth at bedtime.   methotrexate (RHEUMATREX) 2.5 MG tablet Take 2.5 mg by mouth once a week. Takes 6 tabs weekly on Sundays.   Multiple Vitamins-Minerals (MULTIVITAMIN ADULT, MINERALS,) TABS Take 1 tablet by mouth daily.   sertraline (ZOLOFT) 50 MG tablet 1/2 tab po daily x 7 days and then increase to 1 tab daily   ticagrelor (BRILINTA) 90 MG TABS tablet TAKE 1 TABLET(90 MG) BY MOUTH TWICE DAILY   Vitamin D, Ergocalciferol, (DRISDOL) 1.25 MG (50000 UNIT) CAPS capsule Take 1 capsule (50,000 Units total) by mouth every 7 (seven) days.   zolpidem (AMBIEN) 10 MG tablet Take 10 mg by mouth at bedtime as needed.     Review of Systems      All other systems reviewed and are otherwise negative except as noted  above.  Physical Exam    VS:  BP 104/70   Pulse 78   Ht '5\' 8"'$  (1.727 m)   Wt 205 lb (93 kg)   BMI 31.17 kg/m  , BMI Body mass index is 31.17 kg/m.  Wt Readings from Last 3 Encounters:  02/09/21 205 lb (93 kg)  01/22/21 210 lb 6.4 oz (95.4 kg)  01/08/21 220 lb (99.8 kg)     GEN: Well nourished, well developed, in no acute distress. HEENT: normal. Neck: Supple, no JVD, carotid bruits, or masses. Cardiac: RRR, no murmurs, rubs, or gallops. No clubbing, cyanosis, edema.  Radials/PT 2+ and equal bilaterally.  Respiratory:  Respirations regular and unlabored, clear to auscultation bilaterally. GI: Soft, nontender, nondistended. MS: No deformity or atrophy. Skin: Warm and dry, no rash. Neuro:  Strength and sensation are intact. Psych: Normal affect.  Assessment & Plan    CAD- Stable with no anginal symptoms. No indication for ischemic evaluation.  GDMT includes Aspirin, Brillinta, Zetia which was resumed today. He also notes palpitations, as such will reduce Toprol '25mg'$  QD.   Ischemic cardiomyopathy -most recent TEE 10/2020 with normalized LVEF. Low salt diet and fluid restriction encouraged. GDMT includes Metoprolol which was resumed today. No indication for loop diuretic at this time.   Hypertension - BP well controlled. Not presently taking Losartan, as such will discontinue. Encouraged home monitoring. If BP consistently >130/80 plan to resume Losartan.   Diarrhea - notes recurrent diarrhea unrelieved by immodium and pepto bismol. Recommend he follow up with primary care.   DM2 - Continue to follow with PCP.   History of CVA - Continue to follow with neurology. Continue Aspirin, Brilinta. Start Zetia, as below. ILR placed with no findings of arrhythmia - continue to follow with EP.  Hyperlipidemia / Myalgia - Intolerant to statin. Resume Zetia '10mg'$  daily. Anticipate he will need PCSK9i but will defer til follow up as he is understandably hesitant regarding multiple medication  changes.   Of note all refills of cardiac medications [facility] as patient is losing his insurance. He does have information to apply for [facility].  Disposition: Follow up  in September as scheduled  with Dr. Fletcher Anon.  Signed, Loel Dubonnet, NP 02/09/2021, 9:01 PM [place]

## 2021-02-09 NOTE — Telephone Encounter (Signed)
Patient is getting help with his medications at the free clinic and Lantus is not on the formulary per [facility].  They would like to see if we can send in [place]?  Bas[place] pended in the encounter and pharmacy has been selected ([facility]).

## 2021-02-09 NOTE — Patient Instructions (Addendum)
Medication Instructions:  Your physician has recommended you make the following change in your medication:   STOP Losartan *this is an old blood pressure medication that you do not need at this time*  RESUME Metoprolol succinate one '25mg'$  tablet daily  *this will help with palpitations*  *If you need a refill on your cardiac medications before your next appointment, please call your pharmacy*  Lab Work: None ordered today.   Testing/Procedures: None ordered today.   Your echocardiogram in the hospital showed your heart pumping function had returned to normal.   Follow-Up: At [facility], you and your health needs are our priority.  As part of our continuing mission to provide you with exceptional heart care, we have created designated Provider Care Teams.  These Care Teams include your primary Cardiologist (physician) and Advanced Practice Providers (APPs -  Physician Assistants and Nurse Practitioners) who all work together to provide you with the care you need, when you need it.  We recommend signing up for the patient portal called "MyChart".  Sign up information is provided on this After Visit Summary.  MyChart is used to connect with patients for Virtual Visits (Telemedicine).  Patients are able to view lab/test results, encounter notes, upcoming appointments, etc.  Non-urgent messages can be sent to your provider as well.   To learn more about what you can do with MyChart, go to [email or web address].    Your next appointment:   As scheduled with Dr. Fletcher Anon   Other Instructions  Contact our office if your blood pressure is consistently more than 130/80.   Heart Healthy Diet Recommendations: A low-salt diet is recommended. Meats should be grilled, baked, or boiled. Avoid fried foods. Focus on lean protein sources like fish or chicken with vegetables and fruits. The American Heart Association is a Microbiologist!    Exercise recommendations: The American Heart Association  recommends 150 minutes of moderate intensity exercise weekly. Try 30 minutes of moderate intensity exercise 4-5 times per week. This could include walking, jogging, or swimming.

## 2021-02-10 ENCOUNTER — Ambulatory Visit: Payer: Commercial Managed Care - PPO | Admitting: Adult Health

## 2021-02-10 ENCOUNTER — Encounter (HOSPITAL_BASED_OUTPATIENT_CLINIC_OR_DEPARTMENT_OTHER): Payer: Self-pay | Admitting: Family

## 2021-02-10 ENCOUNTER — Other Ambulatory Visit: Payer: Self-pay

## 2021-02-10 ENCOUNTER — Encounter (HOSPITAL_BASED_OUTPATIENT_CLINIC_OR_DEPARTMENT_OTHER): Payer: Self-pay

## 2021-02-12 ENCOUNTER — Other Ambulatory Visit: Payer: Self-pay

## 2021-02-25 NOTE — Progress Notes (Signed)
Carelink Summary Report / Loop Recorder 

## 2021-03-05 ENCOUNTER — Encounter: Payer: Self-pay | Admitting: Adult Health

## 2021-03-05 LAB — CUP PACEART REMOTE DEVICE CHECK
Date Time Interrogation Session: 20220815163808
Implantable Pulse Generator Implant Date: 20220405

## 2021-03-05 NOTE — Telephone Encounter (Signed)
We usually do not complete any type of Social Security disability forms - they will usually contact our office requesting office visit notes but otherwise I have never completed any type of those forms.

## 2021-03-09 ENCOUNTER — Other Ambulatory Visit: Payer: Self-pay

## 2021-03-09 ENCOUNTER — Ambulatory Visit (INDEPENDENT_AMBULATORY_CARE_PROVIDER_SITE_OTHER): Payer: Commercial Managed Care - PPO

## 2021-03-09 DIAGNOSIS — Z8673 Personal history of transient ischemic attack (TIA), and cerebral infarction without residual deficits: Secondary | ICD-10-CM

## 2021-03-09 DIAGNOSIS — I639 Cerebral infarction, unspecified: Secondary | ICD-10-CM

## 2021-03-12 ENCOUNTER — Encounter: Payer: Self-pay | Admitting: Family Medicine

## 2021-03-12 ENCOUNTER — Other Ambulatory Visit: Payer: Self-pay

## 2021-03-12 ENCOUNTER — Ambulatory Visit (INDEPENDENT_AMBULATORY_CARE_PROVIDER_SITE_OTHER): Payer: Self-pay | Admitting: Family Medicine

## 2021-03-12 ENCOUNTER — Other Ambulatory Visit (HOSPITAL_BASED_OUTPATIENT_CLINIC_OR_DEPARTMENT_OTHER): Payer: Self-pay

## 2021-03-12 VITALS — BP 124/66 | HR 81 | Temp 98.1°F | Resp 16 | Wt 205.8 lb

## 2021-03-12 DIAGNOSIS — E669 Obesity, unspecified: Secondary | ICD-10-CM

## 2021-03-12 DIAGNOSIS — I10 Essential (primary) hypertension: Secondary | ICD-10-CM

## 2021-03-12 DIAGNOSIS — E1169 Type 2 diabetes mellitus with other specified complication: Secondary | ICD-10-CM

## 2021-03-12 DIAGNOSIS — Z789 Other specified health status: Secondary | ICD-10-CM

## 2021-03-12 DIAGNOSIS — Z8673 Personal history of transient ischemic attack (TIA), and cerebral infarction without residual deficits: Secondary | ICD-10-CM

## 2021-03-12 DIAGNOSIS — E785 Hyperlipidemia, unspecified: Secondary | ICD-10-CM

## 2021-03-12 DIAGNOSIS — I252 Old myocardial infarction: Secondary | ICD-10-CM

## 2021-03-12 DIAGNOSIS — E782 Mixed hyperlipidemia: Secondary | ICD-10-CM

## 2021-03-12 MED ORDER — DIAZEPAM 5 MG PO TABS
5.0000 mg | ORAL_TABLET | Freq: Two times a day (BID) | ORAL | 1 refills | Status: DC
  Filled 2021-03-12: qty 60, 30d supply, fill #0
  Filled 2021-04-14: qty 60, 30d supply, fill #1

## 2021-03-12 MED ORDER — SERTRALINE HCL 100 MG PO TABS
100.0000 mg | ORAL_TABLET | Freq: Every day | ORAL | 3 refills | Status: DC
  Filled 2021-03-12: qty 30, 30d supply, fill #0

## 2021-03-12 MED ORDER — ZOLPIDEM TARTRATE 10 MG PO TABS
10.0000 mg | ORAL_TABLET | Freq: Every evening | ORAL | 1 refills | Status: DC | PRN
  Filled 2021-03-12: qty 30, 30d supply, fill #0
  Filled 2021-04-14: qty 30, 30d supply, fill #1

## 2021-03-12 MED ORDER — SERTRALINE HCL 100 MG PO TABS: 100.0000 mg | ORAL_TABLET | Freq: Every day | ORAL | 3 refills | Status: DC

## 2021-03-12 NOTE — Progress Notes (Signed)
Patient ID: [name], male    DOB: [birth year]  Age: 60 y.o. MRN: [id number]    Subjective:  Subjective  HPI [name] presents for office visit today for follow up on htn and hyperlipidemia. He is not doing well today but has no recent hospitalization or recent ER visits to report. He states that he is facing difficulties applying for social security disabilities. He reports that he has had a MIA in 2021 and a stroke in 2022. Denies CP/palp/SOB/HA/congestion/fevers or GU c/o. Taking meds as prescribed. He reports that alka seltzer cold has helped him get better sleep.  He has been experiencing diarrhea for 4-5 months now. He states that he experiences 2-6 episodes a month and denies any blood in stool. He endorses trying pepto bismol which he states did not help. He states that he has been feeling depressed and anxious.   Review of Systems  Constitutional:  Negative for chills, fatigue and fever.  HENT:  Negative for congestion, rhinorrhea, sinus pressure, sinus pain and sore throat.   Eyes:  Negative for pain.  Respiratory:  Negative for cough and shortness of breath.   Cardiovascular:  Negative for chest pain, palpitations and leg swelling.  Gastrointestinal:  Positive for diarrhea. Negative for abdominal pain, blood in stool, nausea and vomiting.  Genitourinary:  Negative for flank pain, frequency and penile pain.  Musculoskeletal:  Negative for back pain.  Neurological:  Negative for headaches.  Psychiatric/Behavioral:  Positive for decreased concentration and dysphoric mood. The patient is nervous/anxious.    History Past Medical History:  Diagnosis Date   Anemia 10/03/2009   Qualifier: Diagnosis of  By: [name]    Anxiety associated with depression 10/11/2013   Colon cancer [facility])    right colon cancer- adenocarcinoma CEA level isnrmal at 1.8   Diabetes mellitus    Diabetes mellitus type 2 in obese ([place]) 03/20/2010   Qualifier: Diagnosis of  By: [name]      Erectile dysfunction 06/22/2016   Essential hypertension 06/05/2014   FATTY LIVER DISEASE 11/04/2009   Qualifier: Diagnosis of  By: [name] MD, Docia Chuck   Qualifier: Diagnosis of  By: [name] MD, Docia Chuck  Last Assessment & Plan:  conirmed by CT scan of abdomen in April 2016 encouraged to minimize simple carbs and fatty foods.   GERD 11/04/2009   Qualifier: Diagnosis of  By: [name] MD, Docia Chuck    Great toe pain, right 04/18/2017   Hepatic artery stenosis [facility])    History of colon cancer 01/02/2010   Qualifier: Diagnosis of  By: [name] Dr [name]  Last Assessment & Plan:  Follows closely with gastroenterology, Dr [name]   Hyperlipidemia    Hyperlipidemia, mixed 09/07/2010   Qualifier: Diagnosis of  By: [name]    Hypertension    Hypertriglyceridemia 12/14/2010   Hypotestosteronism 06/22/2016   INSOMNIA, CHRONIC 03/20/2010   Qualifier: Diagnosis of  By: [name] Ambien 10 mg daily does not keep asleep Perrin is over sedating and has trouble waking up    Iron deficiency anemia 2011   Low testosterone 06/22/2016   Malignant neoplasm of colon ([place]) 01/02/2010   Qualifier: Diagnosis of  By: [name] Dr [name]    Muscle spasm of back 06/05/2014   Nipple pain 09/30/2016   Obesity    Palpitations 06/05/2014   Preventative health care 10/08/2012   Psoriasis    Psoriatic arthritis ([place]) 08/25/2017  Right knee pain 11/17/2011    He has a past surgical history that includes Cholecystectomy (1994); Hemicolectomy; Colonoscopy; Polypectomy; Carpal tunnel release; Coronary/Graft Acute MI Revascularization (N/A, 10/27/2019); LEFT HEART CATH AND CORONARY ANGIOGRAPHY (N/A, 10/27/2019); LOOP RECORDER INSERTION (N/A, 10/21/2020); TEE without cardioversion (N/A, 10/21/2020); and Bubble study (10/21/2020).   His family history includes Alcohol abuse in his brother; Diabetes in his brother; Diabetes type II in his brother; Stomach cancer in his mother.He reports that he quit smoking about 16 months  ago. His smoking use included cigarettes. He has never used smokeless tobacco. He reports that he does not drink alcohol and does not use drugs.  Current Outpatient Medications on File Prior to Visit  Medication Sig Dispense Refill   aspirin 81 MG EC tablet Take 1 tablet (81 mg total) by mouth once daily. 90 tablet 3   aspirin EC 81 MG tablet Take 1 tablet (81 mg total) by mouth daily. 90 tablet 3   ezetimibe (ZETIA) 10 MG tablet Take 1 tablet (10 mg total) by mouth once daily. 30 tablet 5   folic acid (FOLVITE) 1 MG tablet Take 1 tablet (1 mg total) by mouth once daily. 90 tablet 2   glucose blood (CONTOUR NEXT TEST) test strip Use to check blood sugar tid.  Dx code: E11.9 300 each 1   HUMALOG KWIKPEN 100 UNIT/ML KwikPen SMARTSIG:15 Unit(s) SUB-Q 3 Times Daily     Insulin Glargine (BASAGLAR KWIKPEN) 100 UNIT/ML Inject 43 Units into the skin once daily. 45 mL 1   insulin glargine (LANTUS) 100 UNIT/ML Solostar Pen Inject 43 Units into the skin daily. 15 mL 11   insulin lispro (HUMALOG KWIKPEN) 100 UNIT/ML KwikPen Inject 15 Units into the skin 3 (three) times daily. 15 mL 3   Insulin Pen Needle 32G X 4 MM MISC Use as directed with insulin 100 each 11   Ixekizumab 80 MG/ML SOSY Inject 80 mg into the skin every 28 (twenty-eight) days.     melatonin 5 MG TABS Take 1 tablet (5 mg total) by mouth at bedtime. 30 tablet 0   methotrexate (RHEUMATREX) 2.5 MG tablet Take 2.5 mg by mouth once a week. Takes 6 tabs weekly on Sundays.     metoprolol succinate (TOPROL XL) 25 MG 24 hr tablet Take 1 tablet (25 mg total) by mouth once daily. 30 tablet 5   Multiple Vitamins-Minerals (MULTIVITAMIN ADULT, MINERALS,) TABS Take 1 tablet by mouth daily. 30 tablet 3   ticagrelor (BRILINTA) 90 MG TABS tablet Take 1 tablet (90 mg total) by mouth 2 (two) times daily. 90 tablet 1   clobetasol cream (TEMOVATE) [id number] % Apply 1 application topically at bedtime as needed (psoriasis). (Patient not taking: No sig reported)     folic  acid (FOLVITE) 1 MG tablet Take 1 tablet (1 mg total) by mouth daily. (Patient not taking: No sig reported) 30 tablet 0   potassium chloride (KLOR-CON) 10 MEQ tablet Take 1 tablet (10 mEq total) by mouth daily. (Patient not taking: No sig reported) 7 tablet 0   predniSONE (DELTASONE) 5 MG tablet Take 1 tablet by mouth daily as needed (psoriasis). (Patient not taking: No sig reported)     Vitamin D, Ergocalciferol, (DRISDOL) 1.25 MG (50000 UNIT) CAPS capsule Take 1 capsule (50,000 Units total) by mouth every 7 (seven) days. (Patient not taking: Reported on 03/12/2021) 4 capsule 4   Vitamin D, Ergocalciferol, (DRISDOL) 1.25 MG (50000 UNIT) CAPS capsule Take 1 capsule (50,000 Units total) by mouth once every  7 days. (Patient not taking: Reported on 03/12/2021) 4 capsule 4   [DISCONTINUED] rosuvastatin (CRESTOR) 10 MG tablet Take 1 tablet (10 mg total) by mouth once daily. 90 tablet 3   No current facility-administered medications on file prior to visit.     Objective:  Objective  Physical Exam Constitutional:      General: He is not in acute distress.    Appearance: Normal appearance. He is not ill-appearing or toxic-appearing.  HENT:     Head: Normocephalic and atraumatic.     Right Ear: Tympanic membrane, ear canal and external ear normal.     Left Ear: Tympanic membrane, ear canal and external ear normal.     Nose: No congestion or rhinorrhea.  Eyes:     Extraocular Movements: Extraocular movements intact.     Pupils: Pupils are equal, round, and reactive to light.  Cardiovascular:     Rate and Rhythm: Normal rate and regular rhythm.     Pulses: Normal pulses.     Heart sounds: Normal heart sounds. No murmur heard. Pulmonary:     Effort: Pulmonary effort is normal. No respiratory distress.     Breath sounds: Normal breath sounds. No wheezing, rhonchi or rales.  Abdominal:     General: Bowel sounds are normal.     Palpations: Abdomen is soft. There is no mass.     Tenderness: There is  no abdominal tenderness. There is no guarding.     Hernia: No hernia is present.  Musculoskeletal:        General: Normal range of motion.     Cervical back: Normal range of motion and neck supple.  Skin:    General: Skin is warm and dry.  Neurological:     Mental Status: He is alert and oriented to person, place, and time.  Psychiatric:        Behavior: Behavior normal.   BP 124/66   Pulse 81   Temp 98.1 F (36.7 C)   Resp 16   Wt 205 lb 12.8 oz (93.4 kg)   SpO2 97%   BMI 31.29 kg/m  Wt Readings from Last 3 Encounters:  03/12/21 205 lb 12.8 oz (93.4 kg)  02/09/21 205 lb (93 kg)  01/22/21 210 lb 6.4 oz (95.4 kg)     Lab Results  Component Value Date   WBC 9.0 12/25/2020   HGB 14.8 12/25/2020   HCT 42.8 12/25/2020   PLT 277.0 12/25/2020   GLUCOSE 203 (H) 03/13/2021   CHOL 158 03/13/2021   TRIG (H) 03/13/2021    405.0 Triglyceride is over 400; calculations on Lipids are invalid.   HDL 30.10 (L) 03/13/2021   LDLDIRECT 76.0 03/13/2021   LDLCALC 118 (H) 10/19/2020   ALT 9 03/13/2021   AST 11 03/13/2021   NA 138 03/13/2021   K 4.0 03/13/2021   CL 104 03/13/2021   CREATININE 0.70 03/13/2021   BUN 11 03/13/2021   CO2 23 03/13/2021   TSH 2.197 10/20/2020   PSA 0.19 08/18/2020   INR 1.0 10/27/2019   HGBA1C 6.7 (H) 03/13/2021   MICROALBUR 0.50 01/08/2013    EP PPM/ICD IMPLANT  Result Date: 10/21/2020 SURGEON:  [name], MD   PREPROCEDURE DIAGNOSIS:  Cryptogenic Stroke   POSTPROCEDURE DIAGNOSIS:  Cryptogenic Stroke    PROCEDURES:  1. Implantable loop recorder implantation   INTRODUCTION:  Luz Eatherly is a 60 y.o. male with a history of unexplained stroke who presents today for implantable loop implantation.  The patient has had  a cryptogenic stroke.  Despite an extensive workup by neurology, no reversible causes have been identified.  he has worn telemetry during which he did not have arrhythmias.  There is significant concern for possible atrial fibrillation as  the cause for the patients stroke.  The patient therefore presents today for implantable loop implantation.   DESCRIPTION OF PROCEDURE:  Informed written consent was obtained.  The patient required no sedation for the procedure today.  The patients left chest was prepped and draped. Mapping over the patient's chest was performed to identify the appropriate ILR site.  This area was found to be the left  parasternal region over the 3rd-4th intercostal space.  The skin overlying this region was infiltrated with lidocaine for local analgesia.  A 0.5-cm incision was made at the implant site.  A subcutaneous ILR pocket was fashioned using a combination of sharp and blunt dissection.  A Medtronic Reveal Linq model [id number] implantable loop recorder was then placed into the pocket R waves were very prominent and measured > 0.2 mV. EBL<1 ml.  Steri- Strips and a sterile dressing were then applied.  There were no early apparent complications.   CONCLUSIONS:  1. Successful implantation of a Medtronic Reveal LINQ implantable loop recorder for cryptogenic stroke  2. No early apparent complications. [name] MD, [facility] 10/21/2020 1:08 PM   ECHO TEE  Result Date: 10/21/2020    TRANSESOPHOGEAL ECHO REPORT   Patient Name:   ZOREN BIRD Date of Exam: 10/21/2020 Medical Rec #:  [id number]      Height:       68.0 in Accession #:    [id number]     Weight:       250.0 lb Date of Birth:  29-Jul-1960      BSA:          2.247 m Patient Age:    29 years       BP:           145/81 mmHg Patient Gender: M              HR:           104 bpm. Exam Location:  Inpatient Procedure: 3D Echo, Transesophageal Echo, Cardiac Doppler and Color Doppler Indications:     Stroke  History:         Patient has prior history of Echocardiogram examinations, most                  recent 10/19/2020. Previous Myocardial Infarction, Abnormal ECG,                  Signs/Symptoms:Chest Pain; Risk Factors:Hypertension,                  Dyslipidemia and Diabetes. Cancer.   Sonographer:     Roseanna Rainbow RDCS Referring Phys:  [place] Diagnosing Phys: Lyman Bishop MD PROCEDURE: After discussion of the risks and benefits of a TEE, an informed consent was obtained from the patient. The transesophogeal probe was passed without difficulty through the esophogus of the patient. Imaged were obtained with the patient in a supine position. Sedation performed by different physician. The patient was monitored while under deep sedation. Anesthestetic sedation was provided intravenously by Anesthesiology: '215mg'$  of Propofol. The patient's vital signs; including heart rate, blood pressure, and oxygen saturation; remained stable throughout the procedure. The patient developed no complications during the procedure. IMPRESSIONS  1. Left ventricular ejection fraction, by estimation, is 55 to 60%. The  left ventricle has normal function. There is mild left ventricular hypertrophy.  2. Right ventricular systolic function is normal. The right ventricular size is normal.  3. Left atrial size was moderately dilated. No left atrial/left atrial appendage thrombus was detected.  4. The mitral valve is grossly normal. Trivial mitral valve regurgitation.  5. The aortic valve is tricuspid. Aortic valve regurgitation is not visualized. Conclusion(s)/Recommendation(s): No LA/LAA thrombus identified. Negative bubble study for interatrial shunt. No intracardiac source of embolism detected on this on this transesophageal echocardiogram. FINDINGS  Left Ventricle: Left ventricular ejection fraction, by estimation, is 55 to 60%. The left ventricle has normal function. The left ventricular internal cavity size was normal in size. There is mild left ventricular hypertrophy. Right Ventricle: The right ventricular size is normal. No increase in right ventricular wall thickness. Right ventricular systolic function is normal. Left Atrium: Left atrial size was moderately dilated. No left atrial/left atrial appendage  thrombus was detected. Right Atrium: Right atrial size was normal in size. Pericardium: There is no evidence of pericardial effusion. Mitral Valve: The mitral valve is grossly normal. Trivial mitral valve regurgitation. Tricuspid Valve: The tricuspid valve is grossly normal. Tricuspid valve regurgitation is trivial. Aortic Valve: The aortic valve is tricuspid. Aortic valve regurgitation is not visualized. Pulmonic Valve: The pulmonic valve was normal in structure. Pulmonic valve regurgitation is not visualized. Aorta: The aortic root and ascending aorta are structurally normal, with no evidence of dilitation. IAS/Shunts: No atrial level shunt detected by color flow Doppler. Agitated saline contrast was given intravenously to evaluate for intracardiac shunting. Lyman Bishop MD Electronically signed by Lyman Bishop MD Signature Date/Time: 10/21/2020/2:25:10 PM    Final      Assessment & Plan:  Plan    Meds ordered this encounter  Medications   diazepam (VALIUM) 5 MG tablet    Sig: Take 1 tablet (5 mg total) by mouth every 12 (twelve) hours.    Dispense:  60 tablet    Refill:  1    Not to exceed 4 additional fills before 06/26/2016   zolpidem (AMBIEN) 10 MG tablet    Sig: Take 1 tablet (10 mg total) by mouth at bedtime as needed.    Dispense:  30 tablet    Refill:  1   DISCONTD: sertraline (ZOLOFT) 100 MG tablet    Sig: Take 1 tablet (100 mg total) by mouth daily.    Dispense:  30 tablet    Refill:  3   sertraline (ZOLOFT) 100 MG tablet    Sig: Take 1 tablet (100 mg total) by mouth daily.    Dispense:  30 tablet    Refill:  3     Problem List Items Addressed This Visit     Diabetes mellitus type 2 in obese (HCC)    hgba1c acceptable, minimize simple carbs. Increase exercise as tolerated. Continue current meds      Relevant Orders   Hemoglobin A1c (Completed)   Comprehensive metabolic panel (Completed)   Hyperlipidemia, mixed - Primary    Tolerating Zetia but does not tolerate  statins. Encourage heart healthy diet such as MIND or DASH diet, increase exercise, avoid trans fats, simple carbohydrates and processed foods, consider a krill or fish or flaxseed oil cap daily. Encouraged to accept a referral to hyperlipidemia clinic at cardiology      Relevant Orders   Lipid panel (Completed)   Moderate essential hypertension    Well controlled, no changes to meds. Encouraged heart healthy diet such as the DASH  diet and exercise as tolerated.       Relevant Orders   Comprehensive metabolic panel (Completed)   History of ST elevation myocardial infarction (STEMI)    Asymptomatic at this time. Encouraged to continue controlling risk factors.       H/O: stroke    He is considering applying for SSI Disability. He is not currently working and with his stroke and MI returning to a high stress work environment would be difficult.       Statin intolerance    On Zetia but does not tolerate statins offered referral to cardiology for management       Other Visit Diagnoses     Hyperlipidemia LDL goal <70           Follow-up: Return in about 3 months (around 06/12/2021), or needs lab appt tomorrow or early next week.  I, Suezanne Jacquet, acting as a scribe for Penni Homans, MD, have documented all relevent documentation on behalf of Penni Homans, MD, as directed by Penni Homans, MD while in the presence of Penni Homans, MD.  I, Mosie Lukes, MD personally performed the services described in this documentation. All medical record entries made by the scribe were at my direction and in my presence. I have reviewed the chart and agree that the record reflects my personal performance and is accurate and complete

## 2021-03-12 NOTE — Patient Instructions (Addendum)
Paxlovid is the new COVID medication we can give you if you get COVID so make sure you test if you have symptoms because we have to treat by day 5 of symptoms for it to be effective. If you are positive let us know so we can treat. If a home test is negative and your symptoms are persistent get a PCR test. Can check testing locations at [email or web address] If you are positive we will make an appointment with [place] and we will send in Paxlovid if you would like it. Check with your pharmacy before we meet to confirm they have it in stock, if they do not then we can get the prescription at the [place] for Diabetes Mellitus, Adult Carbohydrate counting is a method of keeping track of how many carbohydrates you eat. Eating carbohydrates naturally increases the amount of sugar (glucose) in the blood. Counting how many carbohydrates you eat improves your bloodglucose control, which helps you manage your diabetes. It is important to know how many carbohydrates you can safely have in each meal. This is different for every person. A dietitian can help you make a meal plan and calculate how many carbohydrates you should have at each meal andsnack. What foods contain carbohydrates? Carbohydrates are found in the following foods: Grains, such as breads and cereals. Dried beans and soy products. Starchy vegetables, such as potatoes, peas, and corn. Fruit and fruit juices. Milk and yogurt. Sweets and snack foods, such as cake, cookies, candy, chips, and soft drinks. How do I count carbohydrates in foods? There are two ways to count carbohydrates in food. You can read food labels or learn standard serving sizes of foods. You can use either of the methods or acombination of both. Using the Nutrition Facts label The Nutrition Facts list is included on the labels of almost all packaged foods and beverages in the [place] It includes: The serving size. Information about nutrients in  each serving, including the grams (g) of carbohydrate per serving. To use the Nutrition Facts: Decide how many servings you will have. Multiply the number of servings by the number of carbohydrates per serving. The resulting number is the total amount of carbohydrates that you will be having. Learning the standard serving sizes of foods When you eat carbohydrate foods that are not packaged or do not include Nutrition Facts on the label, you need to measure the servings in order to count the amount of carbohydrates. Measure the foods that you will eat with a food scale or measuring cup, if needed. Decide how many standard-size servings you will eat. Multiply the number of servings by 15. For foods that contain carbohydrates, one serving equals 15 g of carbohydrates. For example, if you eat 2 cups or 10 oz (300 g) of strawberries, you will have eaten 2 servings and 30 g of carbohydrates (2 servings x 15 g = 30 g). For foods that have more than one food mixed, such as soups and casseroles, you must count the carbohydrates in each food that is included. The following list contains standard serving sizes of common carbohydrate-rich foods. Each of these servings has about 15 g of carbohydrates: 1 slice of bread. 1 six-inch (15 cm) tortilla. ? cup or 2 oz (53 g) cooked rice or pasta.  cup or 3 oz (85 g) cooked or canned, drained and rinsed beans or lentils.  cup or 3 oz (85 g) starchy vegetable, such as peas, corn, or squash.  cup or 4  oz (120 g) hot cereal.  cup or 3 oz (85 g) boiled or mashed potatoes, or  or 3 oz (85 g) of a large baked potato.  cup or 4 fl oz (118 mL) fruit juice. 1 cup or 8 fl oz (237 mL) milk. 1 small or 4 oz (106 g) apple.  or 2 oz (63 g) of a medium banana. 1 cup or 5 oz (150 g) strawberries. 3 cups or 1 oz (24 g) popped popcorn. What is an example of carbohydrate counting? To calculate the number of carbohydrates in this sample meal, follow the [place]sshown  below. Sample meal 3 oz (85 g) chicken breast. ? cup or 4 oz (106 g) brown rice.  cup or 3 oz (85 g) corn. 1 cup or 8 fl oz (237 mL) milk. 1 cup or 5 oz (150 g) strawberries with sugar-free whipped topping. Carbohydrate calculation Identify the foods that contain carbohydrates: Rice. Corn. Milk. Strawberries. Calculate how many servings you have of each food: 2 servings rice. 1 serving corn. 1 serving milk. 1 serving strawberries. Multiply each number of servings by 15 g: 2 servings rice x 15 g = 30 g. 1 serving corn x 15 g = 15 g. 1 serving milk x 15 g = 15 g. 1 serving strawberries x 15 g = 15 g. Add together all of the amounts to find the total grams of carbohydrates eaten: 30 g + 15 g + 15 g + 15 g = 75 g of carbohydrates total. What are tips for following this plan? Shopping Develop a meal plan and then make a shopping list. Buy fresh and frozen vegetables, fresh and frozen fruit, dairy, eggs, beans, lentils, and whole grains. Look at food labels. Choose foods that have more fiber and less sugar. Avoid processed foods and foods with added sugars. Meal planning Aim to have the same amount of carbohydrates at each meal and for each snack time. Plan to have regular, balanced meals and snacks. Where to find more information American Diabetes Association: www.diabetes.org Centers for Disease Control and Prevention: http://www.wolf.info/ Summary Carbohydrate counting is a method of keeping track of how many carbohydrates you eat. Eating carbohydrates naturally increases the amount of sugar (glucose) in the blood. Counting how many carbohydrates you eat improves your blood glucose control, which helps you manage your diabetes. A dietitian can help you make a meal plan and calculate how many carbohydrates you should have at each meal and snack. This information is not intended to replace advice given to you by your health care provider. Make sure you discuss any questions you have with  your healthcare provider. Document Revised: 07/05/2019 Document Reviewed: 07/06/2019 Elsevier Patient Education  2021 [employer].

## 2021-03-13 ENCOUNTER — Other Ambulatory Visit: Payer: Self-pay

## 2021-03-13 ENCOUNTER — Other Ambulatory Visit (INDEPENDENT_AMBULATORY_CARE_PROVIDER_SITE_OTHER): Payer: Self-pay

## 2021-03-13 ENCOUNTER — Other Ambulatory Visit: Payer: Self-pay | Admitting: Physical Medicine and Rehabilitation

## 2021-03-13 DIAGNOSIS — I10 Essential (primary) hypertension: Secondary | ICD-10-CM

## 2021-03-13 DIAGNOSIS — E669 Obesity, unspecified: Secondary | ICD-10-CM

## 2021-03-13 DIAGNOSIS — E782 Mixed hyperlipidemia: Secondary | ICD-10-CM

## 2021-03-13 DIAGNOSIS — E1169 Type 2 diabetes mellitus with other specified complication: Secondary | ICD-10-CM

## 2021-03-13 LAB — COMPREHENSIVE METABOLIC PANEL
ALT: 9 U/L (ref 0–53)
AST: 11 U/L (ref 0–37)
Albumin: 3.7 g/dL (ref 3.5–5.2)
Alkaline Phosphatase: 55 U/L (ref 39–117)
BUN: 11 mg/dL (ref 6–23)
CO2: 23 mEq/L (ref 19–32)
Calcium: 8.7 mg/dL (ref 8.4–10.5)
Chloride: 104 mEq/L (ref 96–112)
Creatinine, Ser: 0.7 mg/dL (ref 0.40–1.50)
GFR: 101.68 mL/min (ref >60.00)
Glucose, Bld: 203 mg/dL — ABNORMAL HIGH (ref 70–99)
Potassium: 4 mEq/L (ref 3.5–5.1)
Sodium: 138 mEq/L (ref 135–145)
Total Bilirubin: 0.8 mg/dL (ref 0.2–1.2)
Total Protein: 6.5 g/dL (ref 6.0–8.3)

## 2021-03-13 LAB — LIPID PANEL
Cholesterol: 158 mg/dL (ref 0–200)
HDL: 30.1 mg/dL — ABNORMAL LOW (ref >39.00)
Total CHOL/HDL Ratio: 5
Triglycerides: 405 mg/dL — ABNORMAL HIGH (ref 0.0–149.0)

## 2021-03-13 LAB — HEMOGLOBIN A1C: Hgb A1c MFr Bld: 6.7 % — ABNORMAL HIGH (ref 4.6–6.5)

## 2021-03-13 LAB — LDL CHOLESTEROL, DIRECT: Direct LDL: 76 mg/dL

## 2021-03-13 MED ORDER — SERTRALINE HCL 100 MG PO TABS
100.0000 mg | ORAL_TABLET | Freq: Every day | ORAL | 3 refills | Status: DC
  Filled 2021-03-13: qty 30, 30d supply, fill #0
  Filled 2021-04-14: qty 30, 30d supply, fill #1
  Filled 2021-05-13: qty 30, 30d supply, fill #2
  Filled 2021-06-16: qty 30, 30d supply, fill #3

## 2021-03-15 NOTE — Assessment & Plan Note (Signed)
He is considering applying for SSI Disability. He is not currently working and with his stroke and MI returning to a high stress work environment would be difficult.

## 2021-03-15 NOTE — Assessment & Plan Note (Signed)
Well controlled, no changes to meds. Encouraged heart healthy diet such as the DASH diet and exercise as tolerated.  °

## 2021-03-15 NOTE — Assessment & Plan Note (Signed)
On Zetia but does not tolerate statins offered referral to cardiology for management

## 2021-03-15 NOTE — Assessment & Plan Note (Signed)
Asymptomatic at this time. Encouraged to continue controlling risk factors.

## 2021-03-15 NOTE — Assessment & Plan Note (Signed)
Tolerating Zetia but does not tolerate statins. Encourage heart healthy diet such as MIND or DASH diet, increase exercise, avoid trans fats, simple carbohydrates and processed foods, consider a krill or fish or flaxseed oil cap daily. Encouraged to accept a referral to hyperlipidemia clinic at cardiology

## 2021-03-15 NOTE — Assessment & Plan Note (Signed)
hgba1c acceptable, minimize simple carbs. Increase exercise as tolerated. Continue current meds 

## 2021-03-16 ENCOUNTER — Other Ambulatory Visit: Payer: Self-pay | Admitting: Family Medicine

## 2021-03-16 ENCOUNTER — Other Ambulatory Visit: Payer: Self-pay

## 2021-03-16 NOTE — Telephone Encounter (Signed)
He is on Brilinta for CAD/MI by cardiology - please send this request to his cardiology team. Thank you

## 2021-03-17 ENCOUNTER — Other Ambulatory Visit: Payer: Self-pay

## 2021-03-17 ENCOUNTER — Telehealth: Payer: Self-pay | Admitting: Pharmacist

## 2021-03-17 ENCOUNTER — Other Ambulatory Visit (HOSPITAL_BASED_OUTPATIENT_CLINIC_OR_DEPARTMENT_OTHER): Payer: Self-pay | Admitting: Family

## 2021-03-17 MED ORDER — TICAGRELOR 60 MG PO TABS
60.0000 mg | ORAL_TABLET | Freq: Two times a day (BID) | ORAL | 1 refills | Status: DC
  Filled 2021-03-17: qty 180, 90d supply, fill #0
  Filled ????-??-??: fill #0

## 2021-03-17 NOTE — Telephone Encounter (Signed)
Reduced Brilinta dose to '60mg'$  BID as he is 1 year from coronary intervention.

## 2021-03-17 NOTE — Telephone Encounter (Signed)
03/17/2021 2:26:05 PM - Brilinta forms to pt & dr  -- [name] - Tuesday, March 17, 2021 2:24 PM --Received pharmacy printout from [place] for Brilinta '60mg'$  Take 1 tablet ('60mg'$ ) by mouth 2 (two) times daily. Water engineer, mailing patient his portion to sign & return, also sending provider-Caitlin Gilford Rile, NP @ [facility] her portion to sign & return.

## 2021-03-18 ENCOUNTER — Other Ambulatory Visit (HOSPITAL_BASED_OUTPATIENT_CLINIC_OR_DEPARTMENT_OTHER): Payer: Self-pay | Admitting: Family

## 2021-03-18 ENCOUNTER — Other Ambulatory Visit: Payer: Self-pay

## 2021-03-18 MED ORDER — CLOPIDOGREL BISULFATE 75 MG PO TABS
75.0000 mg | ORAL_TABLET | Freq: Every day | ORAL | 2 refills | Status: DC
  Filled 2021-03-18 – 2021-03-20 (×3): qty 30, 30d supply, fill #0

## 2021-03-18 NOTE — Progress Notes (Signed)
Patient transitioning from Brilinta '90mg'$  BID to '60mg'$  BID. Per his pharmacy they do not have Brilinta '60mg'$  in stock and will take 4-6 weeks to ship from manufacturing. As such, will send short term Rx for Plavix '75mg'$  QD until Brilinta '60mg'$  tablets arrive.   Loel Dubonnet, NP

## 2021-03-20 ENCOUNTER — Other Ambulatory Visit: Payer: Self-pay

## 2021-03-24 ENCOUNTER — Ambulatory Visit: Payer: Self-pay | Admitting: Cardiovascular Disease

## 2021-03-25 NOTE — Progress Notes (Signed)
Carelink Summary Report / Loop Recorder 

## 2021-03-27 ENCOUNTER — Other Ambulatory Visit: Payer: Self-pay

## 2021-03-27 ENCOUNTER — Telehealth: Payer: Self-pay | Admitting: Pharmacist

## 2021-03-27 NOTE — Telephone Encounter (Signed)
03/27/2021 1:59:49 PM - Kary Kos pending  -- Elmer Picker - Friday, March 27, 2021 1:58 PM --Patient in office today to pick up meds, he signed his portion of [place] application for Brilinta--holding for provider to return their portion, sent interoffice to provider Laurann Montana, NP on 03/17/21.

## 2021-04-01 ENCOUNTER — Other Ambulatory Visit: Payer: Self-pay

## 2021-04-01 ENCOUNTER — Telehealth: Payer: Self-pay | Admitting: Pharmacist

## 2021-04-01 NOTE — Telephone Encounter (Signed)
04/01/2021 8:35:10 AM - Brilinta faxed to [place] - Wednesday, April 01, 2021 8:34 AM --Faxed [place] Brilinta '60mg'$  Take 1 tablet two (2) times a day, with letter on patient income.

## 2021-04-08 LAB — CUP PACEART REMOTE DEVICE CHECK
Date Time Interrogation Session: 20220917163613
Implantable Pulse Generator Implant Date: 20220405

## 2021-04-09 ENCOUNTER — Other Ambulatory Visit: Payer: Self-pay

## 2021-04-10 ENCOUNTER — Ambulatory Visit (INDEPENDENT_AMBULATORY_CARE_PROVIDER_SITE_OTHER): Payer: Self-pay

## 2021-04-10 DIAGNOSIS — I639 Cerebral infarction, unspecified: Secondary | ICD-10-CM

## 2021-04-14 ENCOUNTER — Other Ambulatory Visit: Payer: Self-pay

## 2021-04-14 ENCOUNTER — Other Ambulatory Visit (HOSPITAL_BASED_OUTPATIENT_CLINIC_OR_DEPARTMENT_OTHER): Payer: Self-pay

## 2021-04-15 NOTE — Progress Notes (Signed)
Carelink Summary Report / Loop Recorder 

## 2021-04-20 ENCOUNTER — Telehealth (HOSPITAL_BASED_OUTPATIENT_CLINIC_OR_DEPARTMENT_OTHER): Payer: Self-pay | Admitting: Family

## 2021-04-20 NOTE — Telephone Encounter (Signed)
Left detailed message--ok per DPR. Asked pt to call back with any questions/concerns.

## 2021-04-20 NOTE — Telephone Encounter (Signed)
Pt returned call. States he is very confused. He stated that he does not know why he would need to be approved for Brilinta when he has already been getting it at no cost from [facility]. I asked him if he remembered filling out paperwork for Brilinta patient assistance and he said yes. I explained that this approval is from filling out that paperwork through the company [employer]. I also explained that the process for assistance was through [facility] and he could call them to find out why he needed the assistance.   He went on to state that he does not have insurance because he lost his job. He stated that he filled out [place] financial assistance for hospital bills. He stated he is frustrated and confused because he was getting his Brilinta already and then got a message saying it is approved. I told pt to try calling the pharmacy and asking them about the Brilinta and I offered to call for him. He stated he did not want me to call because he thought that would change something and he would have to start paying for the [place].   I again attempted to explain what the patient assistance was, but he did not understand what I was saying. He just wanted to know why he needed the assistance at all.   Pt did not want me to talk to the pharmacy concerning this at all in case something got messed up. I am routing this documentation as an FYI. Pt stated he would talk to [facility] the next time he goes to pick up medications.

## 2021-04-20 NOTE — Telephone Encounter (Signed)
Received fax from [employer] that [name] has been approved for patient assistance for Brilinta. Enrollment start date 04/17/21. Enrollment end date 04/17/2022. [employer] Patient ID [id number]  Per fax will take 7-10 business days to ship. Patient can check status of shipment or request refill via [email or web address] or [phone].   Will route to outpatient pharmacy team at [facility] so they are aware.  Loel Dubonnet, NP

## 2021-04-23 ENCOUNTER — Other Ambulatory Visit: Payer: Self-pay

## 2021-05-11 LAB — CUP PACEART REMOTE DEVICE CHECK
Date Time Interrogation Session: 20221020163655
Implantable Pulse Generator Implant Date: 20220405

## 2021-05-13 ENCOUNTER — Ambulatory Visit (INDEPENDENT_AMBULATORY_CARE_PROVIDER_SITE_OTHER): Payer: Self-pay

## 2021-05-13 ENCOUNTER — Other Ambulatory Visit: Payer: Self-pay

## 2021-05-13 DIAGNOSIS — I639 Cerebral infarction, unspecified: Secondary | ICD-10-CM

## 2021-05-16 ENCOUNTER — Encounter: Payer: Self-pay | Admitting: Emergency Medicine

## 2021-05-16 ENCOUNTER — Ambulatory Visit
Admission: EM | Admit: 2021-05-16 | Discharge: 2021-05-16 | Disposition: A | Discharge status: Home / Self Care | Payer: Self-pay | Attending: Physician Assistant | Admitting: Physician Assistant

## 2021-05-16 ENCOUNTER — Other Ambulatory Visit: Payer: Self-pay

## 2021-05-16 ENCOUNTER — Ambulatory Visit (INDEPENDENT_AMBULATORY_CARE_PROVIDER_SITE_OTHER): Payer: Self-pay

## 2021-05-16 DIAGNOSIS — M549 Dorsalgia, unspecified: Secondary | ICD-10-CM | POA: Insufficient documentation

## 2021-05-16 DIAGNOSIS — R109 Unspecified abdominal pain: Secondary | ICD-10-CM

## 2021-05-16 LAB — URINALYSIS, COMPLETE (UACMP) WITH MICROSCOPIC
Bilirubin Urine: NEGATIVE
Glucose, UA: 100 mg/dL — AB
Hgb urine dipstick: NEGATIVE
Leukocytes,Ua: NEGATIVE
Nitrite: NEGATIVE
Protein, ur: NEGATIVE mg/dL
Specific Gravity, Urine: >1.03 — ABNORMAL HIGH (ref 1.005–1.030)
pH: 5.5 (ref 5.0–8.0)

## 2021-05-16 MED ORDER — BACLOFEN 10 MG PO TABS: 10.0000 mg | ORAL_TABLET | Freq: Three times a day (TID) | ORAL | 0 refills | Status: AC | PRN

## 2021-05-16 MED ORDER — BACLOFEN 10 MG PO TABS
10.0000 mg | ORAL_TABLET | Freq: Three times a day (TID) | ORAL | 0 refills | Status: DC | PRN
  Filled 2021-05-16: qty 30, 10d supply, fill #0

## 2021-05-16 MED ORDER — HYDROCODONE-ACETAMINOPHEN 5-325 MG PO TABS: 2.0000 | ORAL_TABLET | Freq: Four times a day (QID) | ORAL | 0 refills | Status: AC | PRN

## 2021-05-16 NOTE — Discharge Instructions (Signed)
-  Your x-ray does not show any evidence of kidney stones.  The urine has no microscopic blood so kidney stones are less likely but if you do have worsening back pain or noticed blood in the urine or have a fever or urinary symptoms you should go to the emergency department to have a CT scan performed. -Your back pain seems more likely musculoskeletal still have sent a muscle relaxer as well as a pain medication if you absolutely need it.  It has Tylenol in it so do not take this and Tylenol.  Also as we discussed with your history of heart attack and stroke you should be avoiding all NSAIDs.  You are on Plavix which thins your blood and NSAIDs can also thin your blood.  Avoid ibuprofen and Aleve. -Keep appointment with your PCP in December but see if they can fit you in sooner if they have a cancellation.
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
## 2021-05-16 NOTE — ED Triage Notes (Addendum)
Pt c/o right sided flank pain. Started about 2 weeks ago. Denies urinary symptoms. Denies h/o kidney stones. He states the pain is worse with movement. Pt would like rx printed today.

## 2021-05-18 ENCOUNTER — Other Ambulatory Visit: Payer: Self-pay

## 2021-05-18 NOTE — Progress Notes (Signed)
Carelink Summary Report / Loop Recorder 

## 2021-05-19 ENCOUNTER — Other Ambulatory Visit: Payer: Self-pay

## 2021-05-19 ENCOUNTER — Other Ambulatory Visit: Payer: Self-pay | Admitting: Physician Assistant

## 2021-05-21 ENCOUNTER — Other Ambulatory Visit: Payer: Self-pay

## 2021-05-21 MED ORDER — BACLOFEN 10 MG PO TABS: 10.0000 mg | ORAL_TABLET | Freq: Three times a day (TID) | ORAL | 0 refills | Status: DC | PRN

## 2021-05-21 MED ORDER — BACLOFEN 10 MG PO TABS
10.0000 mg | ORAL_TABLET | Freq: Three times a day (TID) | ORAL | 0 refills | Status: DC
  Filled 2021-05-21: qty 15, 5d supply, fill #0

## 2021-05-21 NOTE — Telephone Encounter (Signed)
Patient had issue filling rx. Sent to medication mgmt

## 2021-05-27 ENCOUNTER — Other Ambulatory Visit: Payer: Self-pay

## 2021-05-27 ENCOUNTER — Ambulatory Visit: Payer: Medicaid Other

## 2021-05-27 DIAGNOSIS — Z79899 Other long term (current) drug therapy: Secondary | ICD-10-CM

## 2021-05-27 NOTE — Progress Notes (Signed)
Medication Management Clinic Visit Note  Patient: [name] MRN: [id number] Date of Birth: 12/16/1960 PCP: Mosie Lukes, MD   [name] 60 y.o. male presents for a annual MTM visit today. Patient identified with two patient identifiers (name and DOB).  There were no vitals taken for this visit.  Patient Information   Past Medical History:  Diagnosis Date   Anemia 10/03/2009   Qualifier: Diagnosis of  By: [name]    Anxiety associated with depression 10/11/2013   Colon cancer [facility])    right colon cancer- adenocarcinoma CEA level isnrmal at 1.8   Diabetes mellitus    Diabetes mellitus type 2 in obese ([place]) 03/20/2010   Qualifier: Diagnosis of  By: [name]     Erectile dysfunction 06/22/2016   Essential hypertension 06/05/2014   FATTY LIVER DISEASE 11/04/2009   Qualifier: Diagnosis of  By: [name] MD, Docia Chuck   Qualifier: Diagnosis of  By: [name] MD, Docia Chuck  Last Assessment & Plan:  conirmed by CT scan of abdomen in April 2016 encouraged to minimize simple carbs and fatty foods.   GERD 11/04/2009   Qualifier: Diagnosis of  By: [name] MD, Docia Chuck    Great toe pain, right 04/18/2017   Hepatic artery stenosis [facility])    History of colon cancer 01/02/2010   Qualifier: Diagnosis of  By: [name] Dr [name]  Last Assessment & Plan:  Follows closely with gastroenterology, Dr [name]   Hyperlipidemia    Hyperlipidemia, mixed 09/07/2010   Qualifier: Diagnosis of  By: [name]    Hypertension    Hypertriglyceridemia 12/14/2010   Hypotestosteronism 06/22/2016   INSOMNIA, CHRONIC 03/20/2010   Qualifier: Diagnosis of  By: [name] Ambien 10 mg daily does not keep asleep AdvilPM is over sedating and has trouble waking up    Iron deficiency anemia 2011   Low testosterone 06/22/2016   Malignant neoplasm of colon ([place]) 01/02/2010   Qualifier: Diagnosis of  By: [name] Dr [name]    Muscle spasm of back 06/05/2014   Nipple pain 09/30/2016   Obesity     Palpitations 06/05/2014   Preventative health care 10/08/2012   Psoriasis    Psoriatic arthritis ([place]) 08/25/2017   Right knee pain 11/17/2011      Past Surgical History:  Procedure Laterality Date   BUBBLE STUDY  10/21/2020   Procedure: BUBBLE STUDY;  Surgeon: [name], MD;  Location: [place];  Service: Cardiovascular;;   CARPAL TUNNEL RELEASE     RIGHT   CHOLECYSTECTOMY  1994   COLONOSCOPY     CORONARY/GRAFT ACUTE MI REVASCULARIZATION N/A 10/27/2019   Procedure: Coronary/Graft Acute MI Revascularization;  Surgeon: [name], MD;  Location: [place] CV LAB;  Service: Cardiovascular;  Laterality: N/A;   HEMICOLECTOMY     12/17/2009 right   LEFT HEART CATH AND CORONARY ANGIOGRAPHY N/A 10/27/2019   Procedure: LEFT HEART CATH AND CORONARY ANGIOGRAPHY;  Surgeon: [name], MD;  Location: [place] CV LAB;  Service: Cardiovascular;  Laterality: N/A;   LOOP RECORDER INSERTION N/A 10/21/2020   Procedure: LOOP RECORDER INSERTION;  Surgeon: [name], MD;  Location: [place] CV LAB;  Service: Cardiovascular;  Laterality: N/A;   POLYPECTOMY     TEE WITHOUT CARDIOVERSION N/A 10/21/2020   Procedure: TRANSESOPHAGEAL ECHOCARDIOGRAM (TEE);  Surgeon: [name], MD;  Location: [place];  Service: Cardiovascular;  Laterality: N/A;  Family History  Problem Relation Age of Onset   Stomach cancer Mother        diedinher 75's   Diabetes type II Brother        boderline   Diabetes Brother        type II   Alcohol abuse Brother    Other Neg Hx        cad,prostate ca, colon ca   Coronary artery disease Neg Hx    Cancer Neg Hx        colon, prostate   Colon cancer Neg Hx    Esophageal cancer Neg Hx    Rectal cancer Neg Hx     New Diagnoses (since last visit):   Family Support: Good  Lifestyle Diet: Breakfast: 2 eggs and toast, fiber cereal with 1% milk  Lunch: usually skips Dinner: baked chicken, healthy choice meals Drinks: diet soda,  water             Social History   Substance and Sexual Activity  Alcohol Use No      Social History   Tobacco Use  Smoking Status Former   Types: Cigarettes   Quit date: 10/27/2019   Years since quitting: 1.5  Smokeless Tobacco Never      Health Maintenance  Topic Date Due   COVID-19 Vaccine (1) Never done   FOOT EXAM  Never done   URINE MICROALBUMIN  01/08/2014   OPHTHALMOLOGY EXAM  12/13/2020   INFLUENZA VACCINE  02/16/2021   Pneumococcal Vaccine 16-39 Years old (2 - PPSV23 if available, else PCV20) 12/25/2021 (Originally 04/18/2018)   Zoster Vaccines- Shingrix (1 of 2) 03/27/2022 (Originally 11/03/1981)   HEMOGLOBIN A1C  09/13/2021   COLONOSCOPY (Pts 45-93yrs Insurance coverage will need to be confirmed)  01/14/2022   TETANUS/TDAP  04/30/2025   Hepatitis C Screening  Completed   HIV Screening  Completed   HPV VACCINES  Aged Out   Health Maintenance/Date Completed  Last ED visit: 05/16/2021 Last Visit to PCP: 03/12/2021 Next Visit to PCP: December 2022 Specialist Visit: 05/13/2021 Dental Exam: Does not recall last visit  Eye Exam: July 2022 Prostate Exam: 12/2018 Pelvic/PAP Exam: N/A Mammogram: N/A DEXA: N/A Colonoscopy: 12/2018 Flu Vaccine: not received  Pneumonia Vaccine: not received  COVID-19 Vaccine: not received  Shingrix Vaccine: not received     Outpatient Encounter Medications as of 05/27/2021  Medication Sig   aspirin 81 MG EC tablet Take 1 tablet (81 mg total) by mouth once daily.   baclofen (LIORESAL) 10 MG tablet Take 1 tablet (10 mg total) by mouth 3 (three) times daily for 5 days.   diazepam (VALIUM) 5 MG tablet Take 1 tablet (5 mg total) by mouth every 12 (twelve) hours.   ezetimibe (ZETIA) 10 MG tablet Take 1 tablet (10 mg total) by mouth once daily.   glucose blood (CONTOUR NEXT TEST) test strip Use to check blood sugar tid.  Dx code: E11.9   Insulin Glargine (BASAGLAR KWIKPEN) 100 UNIT/ML Inject 43 Units into the skin once daily.    Insulin Pen Needle 32G X 4 MM MISC Use as directed with insulin   melatonin 5 MG TABS Take 1 tablet (5 mg total) by mouth at bedtime.   metoprolol succinate (TOPROL XL) 25 MG 24 hr tablet Take 1 tablet (25 mg total) by mouth once daily.   ticagrelor (BRILINTA) 60 MG TABS tablet Take 1 tablet (60 mg total) by mouth 2 (two) times daily.   zolpidem (AMBIEN) 10 MG tablet Take  1 tablet (10 mg total) by mouth at bedtime as needed.   aspirin EC 81 MG tablet Take 1 tablet (81 mg total) by mouth daily.   clobetasol cream (TEMOVATE) 3.81 % Apply 1 application topically at bedtime as needed (psoriasis).   folic acid (FOLVITE) 1 MG tablet Take 1 tablet (1 mg total) by mouth daily.   folic acid (FOLVITE) 1 MG tablet Take 1 tablet (1 mg total) by mouth once daily. (Patient not taking: Reported on 05/27/2021)   insulin glargine (LANTUS) 100 UNIT/ML Solostar Pen Inject 43 Units into the skin daily. (Patient not taking: Reported on 05/27/2021)   insulin lispro (HUMALOG KWIKPEN) 100 UNIT/ML KwikPen Inject 15 Units into the skin 3 (three) times daily. (Patient not taking: Reported on 05/27/2021)   Multiple Vitamins-Minerals (MULTIVITAMIN ADULT, MINERALS,) TABS Take 1 tablet by mouth daily. (Patient not taking: Reported on 05/27/2021)   potassium chloride (KLOR-CON) 10 MEQ tablet Take 1 tablet (10 mEq total) by mouth daily. (Patient not taking: Reported on 05/27/2021)   sertraline (ZOLOFT) 100 MG tablet Take 1 tablet (100 mg total) by mouth once daily. (Patient not taking: Reported on 05/27/2021)   Vitamin D, Ergocalciferol, (DRISDOL) 1.25 MG (50000 UNIT) CAPS capsule Take 1 capsule (50,000 Units total) by mouth once every 7 days. (Patient not taking: No sig reported)   [DISCONTINUED] HUMALOG KWIKPEN 100 UNIT/ML KwikPen SMARTSIG:15 Unit(s) SUB-Q 3 Times Daily   [DISCONTINUED] Ixekizumab 80 MG/ML SOSY Inject 80 mg into the skin every 28 (twenty-eight) days. (Patient not taking: Reported on 05/27/2021)   [DISCONTINUED]  methotrexate (RHEUMATREX) 2.5 MG tablet Take 2.5 mg by mouth once a week. Takes 6 tabs weekly on Sundays.   [DISCONTINUED] predniSONE (DELTASONE) 5 MG tablet Take 1 tablet by mouth daily as needed (psoriasis). (Patient not taking: No sig reported)   [DISCONTINUED] rosuvastatin (CRESTOR) 10 MG tablet Take 1 tablet (10 mg total) by mouth once daily.   [DISCONTINUED] sertraline (ZOLOFT) 100 MG tablet Take 1 tablet (100 mg total) by mouth daily.   [DISCONTINUED] Vitamin D, Ergocalciferol, (DRISDOL) 1.25 MG (50000 UNIT) CAPS capsule Take 1 capsule (50,000 Units total) by mouth every 7 (seven) days. (Patient not taking: Reported on 03/12/2021)   No facility-administered encounter medications on file as of 05/27/2021.    Assessment and Plan: Mediation adherence: patient reports good adherence to medications with no missed doses. Patient is able to recall names, indications, doses, and frequency. Depression --Patient has sertraline and diazepam listed on MAR, per patient only takes diazepam when needed for anxiety and no longer takes sertraline since he feels much better from a mental health stand point.  DM --Currently on [employer] and Humalog (reports not using), but does not typically use 43 units daily since average glucose readings are lower due to recent dietary changes --Patient does check glucose at home, and reports numbers have improved since making dietary changes  --Instructed patient to bring concerns up with primary care provider, to adjust doses  HLD --Currently receiving treatment with ezetimibe for history of statin intolerance, and reports no issues --Patient stated he does not believe he is allergic to satins, since he took several days of atorvastatin and tolerated it --Instructed patient to bring concerns up with cardiologist in his December appointment  HTN --Metoprolol is only antihypertensive listed on the [facility], but the patient reported he has not taken medication for several  months --Patient does have blood pressure cuff at home, but does not monitor blood pressure frequently --Instructed patient to monitor blood  pressure more frequently, especially given recent history of stroke  Stroke  --Currently receiving aspirin and ticagrelor, and reports no issues --Has regular follow-up appointments scheduled with cardiologist    Patient was congratulated on recent dietary changes and lifestyle modifications. Encouraged patient to wean diet soda intake and doing home exercises as tolerated. Instructed patient to discuss with primary care provider about diabetes medications adjustments and with cardiologist about statin intolerance. Plan to return to clinic in 1 year for annual MTM.  RTC: 1 year   Narda Rutherford, PharmD Pharmacy Resident  05/27/2021 1:47 PM

## 2021-06-14 LAB — CUP PACEART REMOTE DEVICE CHECK
Date Time Interrogation Session: 20221122163356
Implantable Pulse Generator Implant Date: 20220405

## 2021-06-15 ENCOUNTER — Ambulatory Visit (INDEPENDENT_AMBULATORY_CARE_PROVIDER_SITE_OTHER): Payer: Self-pay

## 2021-06-15 DIAGNOSIS — Z8673 Personal history of transient ischemic attack (TIA), and cerebral infarction without residual deficits: Secondary | ICD-10-CM

## 2021-06-16 ENCOUNTER — Ambulatory Visit (INDEPENDENT_AMBULATORY_CARE_PROVIDER_SITE_OTHER): Payer: Self-pay | Admitting: Family

## 2021-06-16 ENCOUNTER — Other Ambulatory Visit: Payer: Self-pay

## 2021-06-16 VITALS — BP 131/76 | HR 96 | Temp 98.3°F | Resp 16 | Ht 68.0 in | Wt 226.2 lb

## 2021-06-16 DIAGNOSIS — E1169 Type 2 diabetes mellitus with other specified complication: Secondary | ICD-10-CM

## 2021-06-16 DIAGNOSIS — F418 Other specified anxiety disorders: Secondary | ICD-10-CM

## 2021-06-16 DIAGNOSIS — D649 Anemia, unspecified: Secondary | ICD-10-CM

## 2021-06-16 DIAGNOSIS — E559 Vitamin D deficiency, unspecified: Secondary | ICD-10-CM

## 2021-06-16 DIAGNOSIS — E782 Mixed hyperlipidemia: Secondary | ICD-10-CM

## 2021-06-16 DIAGNOSIS — E669 Obesity, unspecified: Secondary | ICD-10-CM

## 2021-06-16 DIAGNOSIS — L4050 Arthropathic psoriasis, unspecified: Secondary | ICD-10-CM

## 2021-06-16 MED ORDER — ZOLPIDEM TARTRATE 10 MG PO TABS: 10.0000 mg | ORAL_TABLET | Freq: Every evening | ORAL | 0 refills | Status: DC | PRN

## 2021-06-16 MED ORDER — INSULIN LISPRO (1 UNIT DIAL) 100 UNIT/ML (KWIKPEN): PEN_INJECTOR | SUBCUTANEOUS | 3 refills | Status: DC

## 2021-06-16 NOTE — Assessment & Plan Note (Signed)
Lab Results  Component Value Date   HGBA1C 6.7 (H) 03/13/2021   HGBA1C 8.7 (H) 10/19/2020   HGBA1C 9.6 (H) 08/18/2020   Lab Results  Component Value Date   MICROALBUR 0.50 01/08/2013   LDLCALC 118 (H) 10/19/2020   CREATININE 0.70 03/13/2021   Recommended that he dose humalog TID via sliding scale (see AVS for details) and call if he develops any further hypoglycemia.

## 2021-06-16 NOTE — Assessment & Plan Note (Addendum)
Reports mood is good.  Monitor off of zoloft.  Continue valium prn.

## 2021-06-16 NOTE — Progress Notes (Addendum)
Subjective:   By signing my name below, I, [name], attest that this documentation has been prepared under the direction and in the presence of Frank Alar, NP, 06/16/2021   Patient ID: [name], male    DOB: 1961-07-03, 60 y.o.   MRN: [id number]  Chief Complaint  Patient presents with   Arthritis    Follow up "Arthritis in hands"   Diabetes    HPI Patient is in today for an office visit. He was due to come in for follow up in December, but he is starting a new job and will not be able to get time off so he has decided to try to come in earlier.   Diabetes: he reports that he does take his take his [name] "depending on his blood sugar reading." He does note that he is not eating healthy which has had a poor effect on his blood sugar levels. Admits to only occasional use of humalog but has had some hypoglycemia after use of humalog. He states he never took the prescribed 15 units TID AC meals but has been "dosing it based on my sugar."    Depression and anxiety: He is currently not compliant with taking 100 mg zoloft. Thinks he stopped this a few months ago but is not sure. He is not currently using 5 mg valium but wants to leave it on his med list just in case.   He reports a slight improvement in his depression and anxiety since recovering from his stroke but does not mention that is has completely resolved.   Insomnia- uses ambien prn and is requesting a refill.  Stroke: He has had a stroke at the beginning of this year and is taking blood thinners regularly everyday. Arthritis: He is wanting to have prednisone 47m prn prescribed to him because it helps with the psoriatic arthritis in his hands. He notes that when he takes it he watches his blood sugar levels and doses his insulin accordingly. He is pushing to have hydrocodone prescribed for the pain in his hand. States that he was given hydrocodone by an urgent care and he would like to continue prn for  pain.  He was following with rheumatology but has not returned to them.  Per patient, they declined to refill his prednisone due to his diabetes.    Health Maintenance Due  Topic Date Due   COVID-19 Vaccine (1) Never done   FOOT EXAM  Never done   URINE MICROALBUMIN  01/08/2014   OPHTHALMOLOGY EXAM  12/13/2020   INFLUENZA VACCINE  02/16/2021    Past Medical History:  Diagnosis Date   Anemia 10/03/2009   Qualifier: Diagnosis of  By: [name]   Anxiety associated with depression 10/11/2013   Colon cancer ([place]    right colon cancer- adenocarcinoma CEA level isnrmal at 1.8   Diabetes mellitus    Diabetes mellitus type 2 in obese ([place] 03/20/2010   Qualifier: Diagnosis of  By: [name]    Erectile dysfunction 06/22/2016   Essential hypertension 06/05/2014   FATTY LIVER DISEASE 11/04/2009   Qualifier: Diagnosis of  By: [name], [name]  Qualifier: Diagnosis of  By: [name], [name] Last Assessment & Plan:  conirmed by CT scan of abdomen in April 2016 encouraged to minimize simple carbs and fatty foods.   GERD 11/04/2009   Qualifier: Diagnosis of  By: [name], [name]   Great toe pain,  right 04/18/2017   Hepatic artery stenosis ([name])    History of colon cancer 01/02/2010   Qualifier: Diagnosis of  By: [name] Dr [name]  Last Assessment & Plan:  Follows closely with gastroenterology, Dr [name]   Hyperlipidemia    Hyperlipidemia, mixed 09/07/2010   Qualifier: Diagnosis of  By: [name]    Hypertension    Hypertriglyceridemia 12/14/2010   Hypotestosteronism 06/22/2016   INSOMNIA, CHRONIC 03/20/2010   Qualifier: Diagnosis of  By: [name] Ambien 10 mg daily does not keep asleep AdvilPM is over sedating and has trouble waking up    Iron deficiency anemia 2011   Low testosterone 06/22/2016   Malignant neoplasm of colon ([place]) 01/02/2010   Qualifier: Diagnosis of  By: [name] Dr [name]    Muscle spasm of back 06/05/2014   Nipple  pain 09/30/2016   Obesity    Palpitations 06/05/2014   Preventative health care 10/08/2012   Psoriasis    Psoriatic arthritis ([place]) 08/25/2017   Right knee pain 11/17/2011   Stroke [facility])     Past Surgical History:  Procedure Laterality Date   BUBBLE STUDY  10/21/2020   Procedure: BUBBLE STUDY;  Surgeon: Frank Casino, MD;  Location: [facility] ENDOSCOPY;  Service: Cardiovascular;;   CARPAL TUNNEL RELEASE     RIGHT   CHOLECYSTECTOMY  1994   COLONOSCOPY     CORONARY/GRAFT ACUTE MI REVASCULARIZATION N/A 10/27/2019   Procedure: Coronary/Graft Acute MI Revascularization;  Surgeon: Frank Hampshire, MD;  Location: [name] CV LAB;  Service: Cardiovascular;  Laterality: N/A;   HEMICOLECTOMY     12/17/2009 right   LEFT HEART CATH AND CORONARY ANGIOGRAPHY N/A 10/27/2019   Procedure: LEFT HEART CATH AND CORONARY ANGIOGRAPHY;  Surgeon: Frank Hampshire, MD;  Location: [place] CV LAB;  Service: Cardiovascular;  Laterality: N/A;   LOOP RECORDER INSERTION N/A 10/21/2020   Procedure: LOOP RECORDER INSERTION;  Surgeon: Frank Grayer, MD;  Location: [place] CV LAB;  Service: Cardiovascular;  Laterality: N/A;   POLYPECTOMY     TEE WITHOUT CARDIOVERSION N/A 10/21/2020   Procedure: TRANSESOPHAGEAL ECHOCARDIOGRAM (TEE);  Surgeon: Frank Casino, MD;  Location: [name];  Service: Cardiovascular;  Laterality: N/A;    Family History  Problem Relation Age of Onset   Stomach cancer Mother        diedinher 4's   Diabetes type II Brother        boderline   Diabetes Brother        type II   Alcohol abuse Brother    Other Neg Hx        cad,prostate ca, colon ca   Coronary artery disease Neg Hx    Cancer Neg Hx        colon, prostate   Colon cancer Neg Hx    Esophageal cancer Neg Hx    Rectal cancer Neg Hx     Social History   Socioeconomic History   Marital status: Single    Spouse name: [name]   Number of children: 0   Years of education: Not on file   Highest education level: Not  on file  Occupational History   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  Tobacco Use   Smoking status: Former    Types: Cigarettes    Quit date: 10/27/2019    Years since quitting: 1.6   Smokeless tobacco: Never  Vaping Use   Vaping Use: Never used  Substance and Sexual Activity   Alcohol use: No   Drug use: No   Sexual activity: Yes    Comment: lives with girlfriend, no dietary restrictions  Other Topics Concern   Not on file  Social History Narrative   Occupation: Merchant navy officer (Recruitment consultant)   Single  (lives with [name])   no children    former smoker   Illicit Drug Use - no    Social Determinants of Radio broadcast assistant Strain: Not on file  Food Insecurity: Not on file  Transportation Needs: Not on file  Physical Activity: Not on file  Stress: Not on file  Social Connections: Not on file  Intimate Partner Violence: Not on file    Outpatient Medications Prior to Visit  Medication Sig Dispense Refill   aspirin 81 MG EC tablet Take 1 tablet (81 mg total) by mouth once daily. 90 tablet 3   aspirin EC 81 MG tablet Take 1 tablet (81 mg total) by mouth daily. 90 tablet 3   clobetasol cream (TEMOVATE) 0.53 % Apply 1 application topically at bedtime as needed (psoriasis).     diazepam (VALIUM) 5 MG tablet Take 1 tablet (5 mg total) by mouth every 12 (twelve) hours. 60 tablet 1   ezetimibe (ZETIA) 10 MG tablet Take 1 tablet (10 mg total) by mouth once daily. 30 tablet 5   glucose blood (CONTOUR NEXT TEST) test strip Use to check blood sugar tid.  Dx code: E11.9 300 each 1   Insulin Glargine (BASAGLAR KWIKPEN) 100 UNIT/ML Inject 43 Units into the skin once daily. 45 mL 1   Insulin Pen Needle 32G X 4 MM MISC Use as directed with insulin 100 each 11   melatonin 5 MG TABS Take 1 tablet (5 mg total) by mouth at bedtime. 30 tablet 0   metoprolol succinate (TOPROL XL) 25 MG 24 hr tablet Take 1 tablet (25 mg total) by mouth once daily. 30 tablet 5   Multiple  Vitamins-Minerals (MULTIVITAMIN ADULT, MINERALS,) TABS Take 1 tablet by mouth daily. 30 tablet 3   potassium chloride (KLOR-CON) 10 MEQ tablet Take 1 tablet (10 mEq total) by mouth daily. 7 tablet 0   ticagrelor (BRILINTA) 60 MG TABS tablet Take 1 tablet (60 mg total) by mouth 2 (two) times daily. 180 tablet 1   Vitamin D, Ergocalciferol, (DRISDOL) 1.25 MG (50000 UNIT) CAPS capsule Take 1 capsule (50,000 Units total) by mouth once every 7 days. 4 capsule 4   zolpidem (AMBIEN) 10 MG tablet Take 1 tablet (10 mg total) by mouth at bedtime as needed. 30 tablet 1   insulin glargine (LANTUS) 100 UNIT/ML Solostar Pen Inject 43 Units into the skin daily. 15 mL 11   insulin lispro (HUMALOG KWIKPEN) 100 UNIT/ML KwikPen Inject 15 Units into the skin 3 (three) times daily. 15 mL 3   sertraline (ZOLOFT) 100 MG tablet Take 1 tablet (100 mg total) by mouth once daily. 30 tablet 3   folic acid (FOLVITE) 1 MG tablet Take 1 tablet (1 mg total) by mouth daily. (Patient not taking: Reported on 06/16/2021) 30 tablet 0   folic acid (FOLVITE) 1 MG tablet Take 1 tablet (1 mg total) by mouth once daily. (Patient not taking: Reported on 05/27/2021) 90 tablet 2   No facility-administered medications prior to visit.    Allergies  Allergen Reactions   Plavix [Clopidogrel]     "Weird" sensation in legs    Review of Systems  Musculoskeletal:  Positive for joint  pain (arthritis).  Psychiatric/Behavioral:  Positive for depression. The patient is nervous/anxious.       Objective:    Physical Exam Constitutional:      General: He is not in acute distress.    Appearance: Normal appearance. He is not ill-appearing.  HENT:     Head: Normocephalic and atraumatic.     Right Ear: External ear normal.     Left Ear: External ear normal.  Eyes:     Extraocular Movements: Extraocular movements intact.     Pupils: Pupils are equal, round, and reactive to light.  Skin:    General: Skin is warm and dry.  Neurological:      Mental Status: He is alert and oriented to person, place, and time.  Psychiatric:        Attention and Perception: Attention normal.        Mood and Affect: Affect is angry.        Speech: Speech normal.        Behavior: Behavior is agitated (pt became angry/agitated during visit).        Judgment: Judgment normal.    BP 131/76 (BP Location: Right Arm, Patient Position: Sitting, Cuff Size: Normal)   Pulse 96   Temp 98.3 F (36.8 C) (Oral)   Resp 16   Ht '5\' 8"'  (1.727 m)   Wt 226 lb 3.2 oz (102.6 kg)   SpO2 98%   BMI 34.39 kg/m  Wt Readings from Last 3 Encounters:  06/16/21 226 lb 3.2 oz (102.6 kg)  05/16/21 205 lb 14.6 oz (93.4 kg)  03/12/21 205 lb 12.8 oz (93.4 kg)       Assessment & Plan:   Problem List Items Addressed This Visit       Unprioritized   Psoriatic arthritis ([place])    Advised pt that I was not comfortable prescribing hydrocodone for his psoriatic arthritis as it can be habit forming and is not safe to take with benzos and ambien.  Recommended a safer alternative for him would be extra strength tylenol prn.  I also advised him that it would be best for him to arrange follow up with his rheumatologist to discuss alternative treatment options for his psoriatic arthritis rather than prednisone.  I did not feel that it was appropriate for primary care to be managing with chronic prednisone in the setting of his DM2.  Of note- his last visit with rheumatology 3/22 noted that the pt was being prescribed methotrexate and Talz sq monthly.   Patient became very angry during visit that I would not prescribe hydrocodone or prednisone, stating "I paid out of pocket and did not get anything that I wanted." he asked repeatedly if I would prescribe him hydrocodone despite my response of no.  I reminded patient that we have reviewed and managed his blood pressure/cholesterol/anxiety and DM2, but he says he doesn't "need all that." He then declined to do any of the recommended lab work  following today's visit.        Hyperlipidemia, mixed    It sounds like he is not taking his statin. Plan to resume. Check lipid panel.       Relevant Orders   Lipid panel   Diabetes mellitus type 2 in obese [facility])    Lab Results  Component Value Date   HGBA1C 6.7 (H) 03/13/2021   HGBA1C 8.7 (H) 10/19/2020   HGBA1C 9.6 (H) 08/18/2020   Lab Results  Component Value Date   MICROALBUR 0.50 01/08/2013  [place] 118 (H) 10/19/2020   CREATININE 0.70 03/13/2021  Recommended that he dose humalog TID via sliding scale (see AVS for details) and call if he develops any further hypoglycemia.       Relevant Medications   insulin lispro (HUMALOG KWIKPEN) 100 UNIT/ML KwikPen   Other Relevant Orders   Comp Met (CMET)   Depression with anxiety    Reports mood is good.  Monitor off of zoloft.  Continue valium prn.       Anemia    Lab Results  Component Value Date   WBC 9.0 12/25/2020   HGB 14.8 12/25/2020   HCT 42.8 12/25/2020   MCV 91.6 12/25/2020   PLT 277.0 12/25/2020  Last cbc wnl.       Other Visit Diagnoses     Vitamin D deficiency    -  Primary   Relevant Orders   Vitamin D (25 hydroxy)      Meds ordered this encounter  Medications   insulin lispro (HUMALOG KWIKPEN) 100 UNIT/ML KwikPen    Sig: Take 3x daily before meals per sliding scale. sliding scale- check sugar and inject 3 times daily before meals as below:  <150-   Zero units 150-200 2 units 201-250 4 units 251-300 6 units 301-350 8 units 351-400 10 units >400             12 units and contact us.    Dispense:  15 mL    Refill:  3    Order Specific Question:   Supervising Provider    Answer:   Penni Homans A [[id number]]    I, Frank Alar, NP, personally preformed the services described in this documentation.  All medical record entries made by the scribe were at my direction and in my presence.  I have reviewed the chart and discharge instructions (if applicable) and agree that the record reflects my  personal performance and is accurate and complete. 06/16/2021  I,[name],acting as a Education administrator for Nance Pear, NP.,have documented all relevant documentation on the behalf of Nance Pear, NP,as directed by  Nance Pear, NP while in the presence of Nance Pear, NP.  Nance Pear, NP

## 2021-06-16 NOTE — Assessment & Plan Note (Addendum)
Advised pt that I was not comfortable prescribing hydrocodone for his psoriatic arthritis as it can be habit forming and is not safe to take with benzos and ambien.  Recommended a safer alternative for him would be extra strength tylenol prn.  I also advised him that it would be best for him to arrange follow up with his rheumatologist to discuss alternative treatment options for his psoriatic arthritis rather than prednisone.  I did not feel that it was appropriate for primary care to be managing with chronic prednisone in the setting of his DM2.  Of note- his last visit with rheumatology 3/22 noted that the pt was being prescribed methotrexate and Talz sq monthly.   Patient became very angry during visit that I would not prescribe hydrocodone or prednisone, stating "I paid out of pocket and did not get anything that I wanted." he asked repeatedly if I would prescribe him hydrocodone despite my response of no.  I reminded patient that we have reviewed and managed his blood pressure/cholesterol/anxiety and DM2, but he says he doesn't "need all that." He then declined to do any of the recommended lab work following today's visit.

## 2021-06-16 NOTE — Assessment & Plan Note (Signed)
It sounds like he is not taking his statin. Plan to resume. Check lipid panel.

## 2021-06-16 NOTE — Assessment & Plan Note (Signed)
Lab Results  Component Value Date   WBC 9.0 12/25/2020   HGB 14.8 12/25/2020   HCT 42.8 12/25/2020   MCV 91.6 12/25/2020   PLT 277.0 12/25/2020   Last cbc wnl.

## 2021-06-16 NOTE — Patient Instructions (Addendum)
sliding scale- check sugar and inject 3 times daily before meals as below:  <150-   Zero units 150-200 2 units 201-250 4 units 251-300 6 units 301-350 8 units 351-400 10 units >400             12 units and contact us.

## 2021-06-18 ENCOUNTER — Other Ambulatory Visit: Payer: Self-pay

## 2021-06-23 ENCOUNTER — Ambulatory Visit: Payer: Medicaid Other | Admitting: Cardiovascular Disease

## 2021-06-24 NOTE — Progress Notes (Signed)
Carelink Summary Report / Loop Recorder 

## 2021-06-25 ENCOUNTER — Other Ambulatory Visit: Payer: Self-pay

## 2021-06-26 ENCOUNTER — Other Ambulatory Visit: Payer: Self-pay

## 2021-06-26 MED ORDER — BRILINTA 60 MG PO TABS
60.0000 mg | ORAL_TABLET | Freq: Two times a day (BID) | ORAL | 3 refills | Status: DC
  Filled 2021-06-26: qty 180, 90d supply, fill #0

## 2021-06-29 ENCOUNTER — Other Ambulatory Visit: Payer: Self-pay

## 2021-06-30 ENCOUNTER — Other Ambulatory Visit: Payer: Self-pay

## 2021-06-30 ENCOUNTER — Ambulatory Visit: Payer: Medicaid Other | Admitting: Family Medicine

## 2021-07-01 ENCOUNTER — Other Ambulatory Visit: Payer: Self-pay

## 2021-07-14 ENCOUNTER — Telehealth: Payer: Self-pay | Admitting: *Deleted

## 2021-07-14 ENCOUNTER — Other Ambulatory Visit (HOSPITAL_BASED_OUTPATIENT_CLINIC_OR_DEPARTMENT_OTHER): Payer: Self-pay

## 2021-07-14 LAB — CUP PACEART REMOTE DEVICE CHECK
Date Time Interrogation Session: 20221225163559
Implantable Pulse Generator Implant Date: 20220405

## 2021-07-14 NOTE — Telephone Encounter (Addendum)
Patient came in and would not tell front desk what he wanted.    I went and spoke with patient and he stated his situation was complicated.  He started a new job on 06/22/21 and will be getting new insurance that starts 07/19/21.  He would like his rxs sent to [facility] [place]. [address] [place], [place] fax [phone].  He would like his meds sent in around 08/02/21.  He would like a cgm like the [place].  I advised him to call new insurance when he gets the information and see if it is covered and what kind they will cover.  He will send a mychart message on 08/02/21 to request the medications and will let us know if insurance will cover the cgm.  Also patient stated that he was having low sugars because of the Humalog.  He was not using his insulins medication correctly.  He stated that he was just doing what he wanted with it.  I printed off the humalog directions and went over in detail of how to use the medication and also that he needs to be using the long acting as well.  He stated that he has not been using the long acting and keep it on reserve.  Advised on how each medication works and that he needs to use both of them daily and the correct way.  Also spoke with patient about diabetic education and to call us if he has any problems with sugar levels immediately.

## 2021-07-21 ENCOUNTER — Ambulatory Visit (INDEPENDENT_AMBULATORY_CARE_PROVIDER_SITE_OTHER): Payer: Self-pay

## 2021-07-21 DIAGNOSIS — I255 Ischemic cardiomyopathy: Secondary | ICD-10-CM

## 2021-07-27 ENCOUNTER — Telehealth: Payer: Self-pay | Admitting: Pharmacy Technician

## 2021-07-27 NOTE — Telephone Encounter (Signed)
Patient stated that he has prescription drug coverage and no longer needs [facility].  Prescriptions transferred.  [facility]

## 2021-07-31 NOTE — Progress Notes (Signed)
Carelink Summary Report / Loop Recorder 

## 2021-08-06 ENCOUNTER — Ambulatory Visit: Payer: Commercial Managed Care - PPO | Admitting: Adult Health

## 2021-08-17 ENCOUNTER — Other Ambulatory Visit: Payer: Self-pay

## 2021-08-23 LAB — CUP PACEART REMOTE DEVICE CHECK
Date Time Interrogation Session: 20230204230212
Implantable Pulse Generator Implant Date: 20220405

## 2021-08-24 ENCOUNTER — Ambulatory Visit (INDEPENDENT_AMBULATORY_CARE_PROVIDER_SITE_OTHER): Payer: BC Managed Care – PPO

## 2021-08-24 DIAGNOSIS — I255 Ischemic cardiomyopathy: Secondary | ICD-10-CM | POA: Diagnosis not present

## 2021-08-27 NOTE — Progress Notes (Signed)
Carelink Summary Report / Loop Recorder 

## 2021-09-01 ENCOUNTER — Other Ambulatory Visit: Payer: Self-pay

## 2021-09-14 DIAGNOSIS — L4050 Arthropathic psoriasis, unspecified: Secondary | ICD-10-CM | POA: Diagnosis not present

## 2021-09-14 DIAGNOSIS — Z79899 Other long term (current) drug therapy: Secondary | ICD-10-CM | POA: Diagnosis not present

## 2021-09-14 DIAGNOSIS — L409 Psoriasis, unspecified: Secondary | ICD-10-CM | POA: Diagnosis not present

## 2021-09-28 ENCOUNTER — Encounter: Payer: Self-pay | Admitting: Family Medicine

## 2021-09-28 ENCOUNTER — Ambulatory Visit (INDEPENDENT_AMBULATORY_CARE_PROVIDER_SITE_OTHER): Payer: BC Managed Care – PPO

## 2021-09-28 DIAGNOSIS — I255 Ischemic cardiomyopathy: Secondary | ICD-10-CM

## 2021-09-29 ENCOUNTER — Other Ambulatory Visit: Payer: Self-pay

## 2021-09-29 LAB — CUP PACEART REMOTE DEVICE CHECK
Date Time Interrogation Session: 20230312230302
Implantable Pulse Generator Implant Date: 20220405

## 2021-09-29 MED ORDER — CONTOUR NEXT TEST VI STRP
ORAL_STRIP | 1 refills | Status: DC
  Filled 2021-09-29: qty 300, fill #0

## 2021-09-29 MED ORDER — INSULIN LISPRO (1 UNIT DIAL) 100 UNIT/ML (KWIKPEN)
PEN_INJECTOR | SUBCUTANEOUS | 3 refills | Status: DC
  Filled 2021-09-29: qty 15, fill #0

## 2021-09-29 MED ORDER — BASAGLAR KWIKPEN 100 UNIT/ML ~~LOC~~ SOPN
43.0000 [IU] | PEN_INJECTOR | Freq: Every day | SUBCUTANEOUS | 1 refills | Status: DC
  Filled 2021-09-29: qty 45, 104d supply, fill #0

## 2021-10-05 ENCOUNTER — Other Ambulatory Visit (HOSPITAL_BASED_OUTPATIENT_CLINIC_OR_DEPARTMENT_OTHER): Payer: Self-pay

## 2021-10-08 ENCOUNTER — Other Ambulatory Visit: Payer: Self-pay

## 2021-10-12 NOTE — Progress Notes (Signed)
Carelink Summary Report / Loop Recorder 

## 2021-10-25 ENCOUNTER — Emergency Department
Admission: EM | Admit: 2021-10-25 | Discharge: 2021-10-25 | Disposition: A | Discharge status: Home / Self Care | Payer: BC Managed Care – PPO | Attending: Emergency Medicine | Admitting: Emergency Medicine

## 2021-10-25 ENCOUNTER — Other Ambulatory Visit: Payer: Self-pay

## 2021-10-25 ENCOUNTER — Encounter: Payer: Self-pay | Admitting: Emergency Medicine

## 2021-10-25 ENCOUNTER — Emergency Department: Payer: BC Managed Care – PPO

## 2021-10-25 DIAGNOSIS — S39011A Strain of muscle, fascia and tendon of abdomen, initial encounter: Secondary | ICD-10-CM | POA: Diagnosis not present

## 2021-10-25 DIAGNOSIS — E119 Type 2 diabetes mellitus without complications: Secondary | ICD-10-CM | POA: Insufficient documentation

## 2021-10-25 DIAGNOSIS — S299XXA Unspecified injury of thorax, initial encounter: Secondary | ICD-10-CM | POA: Diagnosis not present

## 2021-10-25 DIAGNOSIS — S29019A Strain of muscle and tendon of unspecified wall of thorax, initial encounter: Secondary | ICD-10-CM

## 2021-10-25 DIAGNOSIS — I10 Essential (primary) hypertension: Secondary | ICD-10-CM | POA: Insufficient documentation

## 2021-10-25 DIAGNOSIS — X58XXXA Exposure to other specified factors, initial encounter: Secondary | ICD-10-CM | POA: Diagnosis not present

## 2021-10-25 DIAGNOSIS — N200 Calculus of kidney: Secondary | ICD-10-CM | POA: Diagnosis not present

## 2021-10-25 DIAGNOSIS — K439 Ventral hernia without obstruction or gangrene: Secondary | ICD-10-CM | POA: Diagnosis not present

## 2021-10-25 DIAGNOSIS — Z85038 Personal history of other malignant neoplasm of large intestine: Secondary | ICD-10-CM | POA: Insufficient documentation

## 2021-10-25 DIAGNOSIS — Z9049 Acquired absence of other specified parts of digestive tract: Secondary | ICD-10-CM | POA: Diagnosis not present

## 2021-10-25 DIAGNOSIS — R109 Unspecified abdominal pain: Secondary | ICD-10-CM | POA: Insufficient documentation

## 2021-10-25 DIAGNOSIS — S29012A Strain of muscle and tendon of back wall of thorax, initial encounter: Secondary | ICD-10-CM | POA: Diagnosis not present

## 2021-10-25 LAB — CBC WITH DIFFERENTIAL/PLATELET
Abs Immature Granulocytes: 0.07 10*3/uL (ref 0.00–0.07)
Basophils Absolute: 0.1 10*3/uL (ref 0.0–0.1)
Basophils Relative: 1 %
Eosinophils Absolute: 0.5 10*3/uL (ref 0.0–0.5)
Eosinophils Relative: 5 %
HCT: 40.7 % (ref 39.0–52.0)
Hemoglobin: 13.8 g/dL (ref 13.0–17.0)
Immature Granulocytes: 1 %
Lymphocytes Relative: 29 %
Lymphs Abs: 2.9 10*3/uL (ref 0.7–4.0)
MCH: 32.5 pg (ref 26.0–34.0)
MCHC: 33.9 g/dL (ref 30.0–36.0)
MCV: 95.8 fL (ref 80.0–100.0)
Monocytes Absolute: 1 10*3/uL (ref 0.1–1.0)
Monocytes Relative: 10 %
Neutro Abs: 5.3 10*3/uL (ref 1.7–7.7)
Neutrophils Relative %: 54 %
Platelets: 275 10*3/uL (ref 150–400)
RBC: 4.25 MIL/uL (ref 4.22–5.81)
RDW: 14.3 % (ref 11.5–15.5)
WBC: 9.9 10*3/uL (ref 4.0–10.5)
nRBC: 0 % (ref 0.0–0.2)

## 2021-10-25 LAB — LIPASE, BLOOD: Lipase: 29 U/L (ref 11–51)

## 2021-10-25 LAB — COMPREHENSIVE METABOLIC PANEL
ALT: 21 U/L (ref 0–44)
AST: 23 U/L (ref 15–41)
Albumin: 3.6 g/dL (ref 3.5–5.0)
Alkaline Phosphatase: 52 U/L (ref 38–126)
Anion gap: 7 (ref 5–15)
BUN: 13 mg/dL (ref 6–20)
CO2: 25 mmol/L (ref 22–32)
Calcium: 8.9 mg/dL (ref 8.9–10.3)
Chloride: 102 mmol/L (ref 98–111)
Creatinine, Ser: 0.85 mg/dL (ref 0.61–1.24)
GFR, Estimated: >60 mL/min (ref >60)
Glucose, Bld: 243 mg/dL — ABNORMAL HIGH (ref 70–99)
Potassium: 4.2 mmol/L (ref 3.5–5.1)
Sodium: 134 mmol/L — ABNORMAL LOW (ref 135–145)
Total Bilirubin: 1 mg/dL (ref 0.3–1.2)
Total Protein: 7.1 g/dL (ref 6.5–8.1)

## 2021-10-25 LAB — URINALYSIS, ROUTINE W REFLEX MICROSCOPIC
Bacteria, UA: NONE SEEN
Glucose, UA: >=500 mg/dL — AB
Hgb urine dipstick: NEGATIVE
Ketones, ur: NEGATIVE mg/dL
Leukocytes,Ua: NEGATIVE
Nitrite: NEGATIVE
Protein, ur: NEGATIVE mg/dL
Specific Gravity, Urine: 1.02 (ref 1.005–1.030)
Squamous Epithelial / HPF: NONE SEEN (ref 0–5)
pH: 6 (ref 5.0–8.0)

## 2021-10-25 MED ORDER — KETOROLAC TROMETHAMINE 30 MG/ML IJ SOLN
15.0000 mg | Freq: Once | INTRAMUSCULAR | Status: AC
  Administered 2021-10-25: 15 mg via INTRAVENOUS
  Filled 2021-10-25: qty 1

## 2021-10-25 MED ORDER — LIDOCAINE 5 % EX PTCH: 1.0000 | MEDICATED_PATCH | Freq: Two times a day (BID) | CUTANEOUS | 0 refills | Status: DC

## 2021-10-25 MED ORDER — CYCLOBENZAPRINE HCL 5 MG PO TABS: 5.0000 mg | ORAL_TABLET | Freq: Three times a day (TID) | ORAL | 0 refills | Status: DC | PRN

## 2021-10-25 MED ORDER — LIDOCAINE 5 % EX PTCH
1.0000 | MEDICATED_PATCH | CUTANEOUS | Status: DC
  Administered 2021-10-25: 1 via TRANSDERMAL
  Filled 2021-10-25: qty 1

## 2021-10-25 NOTE — ED Triage Notes (Signed)
Pt c/o R flank pain for "a while" seen for similar sxs in the past at [facility].  Pt reports driving himself to ED, states when he hit a bump the pain worsened. ? ?No prior hx of kidney stones. ?

## 2021-10-25 NOTE — ED Triage Notes (Signed)
Pt states he has had right lower back pain off and on for the past year. ?

## 2021-10-25 NOTE — ED Provider Notes (Signed)
? ?[facility] ?Provider Note ? ? ? Event Date/Time  ? First MD Initiated Contact with Patient 10/25/21 0435   ?  (approximate) ? ? ?History  ? ?Chief Complaint ?Flank Pain ? ? ?HPI ? ?Deadrian Toya is a 61 y.o. male with past medical history of hypertension, hyperlipidemia, diabetes, stroke, and colon cancer who presents to the ED complaining of flank pain.  Patient reports that he has been dealing with intermittent flareups of pain in his right flank for the past couple of months.  He has had another flareup over the past 2 days with sharp pain in his right flank that is exacerbated by certain positions.  He denies any falls or trauma to his back, but states he became more concerned this morning when pain became more constant.  He states he had some difficulty urinating with the onset of pain but denies any dysuria or hematuria.  He has not had any fevers, abdominal pain, nausea, vomiting, numbness or weakness in his extremities.  He has not taken anything for his symptoms prior to arrival, denies any history of kidney stones. ? ?  ? ? ?Physical Exam  ? ?Triage Vital Signs: ?ED Triage Vitals  ?Enc Vitals Group  ?   BP 10/25/21 0438 (!) 154/89  ?   Pulse Rate 10/25/21 0438 84  ?   Resp 10/25/21 0438 18  ?   Temp 10/25/21 0438 98.7 ?F (37.1 ?C)  ?   Temp Source 10/25/21 0438 Oral  ?   SpO2 10/25/21 0438 99 %  ?   Weight 10/25/21 0432 235 lb (106.6 kg)  ?   Height 10/25/21 0432 '5\' 7"'$  (1.702 m)  ?   Head Circumference --   ?   Peak Flow --   ?   Pain Score 10/25/21 0432 10  ?   Pain Loc --   ?   Pain Edu? --   ?   Excl. in [place]? --   ? ? ?Most recent vital signs: ?Vitals:  ? 10/25/21 0438  ?BP: (!) 154/89  ?Pulse: 84  ?Resp: 18  ?Temp: 98.7 ?F (37.1 ?C)  ?SpO2: 99%  ? ? ?Constitutional: Alert and oriented. ?Eyes: Conjunctivae are normal. ?Head: Atraumatic. ?Nose: No congestion/rhinnorhea. ?Mouth/Throat: Mucous membranes are moist.  ?Cardiovascular: Normal rate, regular rhythm. Grossly normal heart  sounds.  2+ radial pulses bilaterally. ?Respiratory: Normal respiratory effort.  No retractions. Lungs CTAB. ?Gastrointestinal: Soft and nontender.  Right CVA tenderness to palpation noted.  No distention. ?Musculoskeletal: No lower extremity tenderness nor edema.  ?Neurologic:  Normal speech and language. No gross focal neurologic deficits are appreciated. ? ? ? ?ED Results / Procedures / Treatments  ? ?Labs ?(all labs ordered are listed, but only abnormal results are displayed) ?Labs Reviewed  ?COMPREHENSIVE METABOLIC PANEL - Abnormal; Notable for the following components:  ?    Result Value  ? Sodium 134 (*)   ? Glucose, Bld 243 (*)   ? All other components within normal limits  ?URINALYSIS, ROUTINE W REFLEX MICROSCOPIC - Abnormal; Notable for the following components:  ? Color, Urine YELLOW (*)   ? APPearance CLEAR (*)   ? Glucose, UA >=500 (*)   ? Bilirubin Urine MODERATE (*)   ? All other components within normal limits  ?CBC WITH DIFFERENTIAL/PLATELET  ?LIPASE, BLOOD  ? ? ?RADIOLOGY ?CT of abdomen/pelvis reviewed by me with no obvious ureteral stones bilaterally and no hydronephrosis. ? ?PROCEDURES: ? ?Critical Care performed: No ? ?Procedures ? ? ?MEDICATIONS  ORDERED IN ED: ?Medications  ?ketorolac (TORADOL) 30 MG/ML injection 15 mg (has no administration in time range)  ?lidocaine (LIDODERM) 5 % 1 patch (has no administration in time range)  ? ? ? ?IMPRESSION / MDM / ASSESSMENT AND PLAN / ED COURSE  ?I reviewed the triage vital signs and the nursing notes. ?             ?               ? ?62 y.o. male with past medical history of hypertension, hyperlipidemia, diabetes, stroke, and colon cancer who presents to the ED with intermittent flareups of right flank pain over the past couple of months, acutely worse over the past 2 days. ? ?Differential diagnosis includes, but is not limited to, kidney stone, pyelonephritis, muscle strain, urinary obstruction. ? ?Patient nontoxic-appearing and in no acute distress,  vital signs are unremarkable.  Pain is reproducible with palpation of his costovertebral area on the right but he has no focal abdominal tenderness.  He reports prior x-ray imaging at urgent care that showed no signs of kidney stone but given ongoing symptoms we will further assess with CT scan today.  Labs and UA are pending at this time, patient declines any pain medication. ? ?CT scan is unremarkable and does not show kidney stone that could be causing patient's pain.  UA shows no signs of infection and labs are also reassuring.  CBC without anemia or leukocytosis, BMP without electrolyte abnormality or AKI.  Patient states that pain is now coming back and we will give dose of IV Toradol and place Lidoderm patch.  Given reassuring work-up, suspect symptoms are musculoskeletal in etiology and he is appropriate for discharge home with PCP follow-up.  He was counseled to return to the ED for new worsening symptoms, patient agrees with plan. ? ?  ? ? ?FINAL CLINICAL IMPRESSION(S) / ED DIAGNOSES  ? ?Final diagnoses:  ?Right flank pain  ?Thoracic myofascial strain, initial encounter  ? ? ? ?Rx / DC Orders  ? ?ED Discharge Orders   ? ?      Ordered  ?  lidocaine (LIDODERM) 5 %  Every 12 hours       ? 10/25/21 0617  ?  cyclobenzaprine (FLEXERIL) 5 MG tablet  3 times daily PRN       ? 10/25/21 0617  ? ?  ?  ? ?  ? ? ? ?Note:  This document was prepared using Dragon voice recognition software and may include unintentional dictation errors. ?  ?Blake Divine, MD ?10/25/21 [phone] ? ?

## 2021-11-02 ENCOUNTER — Ambulatory Visit (INDEPENDENT_AMBULATORY_CARE_PROVIDER_SITE_OTHER): Payer: Self-pay

## 2021-11-02 DIAGNOSIS — I255 Ischemic cardiomyopathy: Secondary | ICD-10-CM

## 2021-11-02 NOTE — Progress Notes (Addendum)
Patient not seen.

## 2021-11-03 ENCOUNTER — Encounter: Payer: Self-pay | Admitting: Family Medicine

## 2021-11-03 ENCOUNTER — Ambulatory Visit: Payer: BC Managed Care – PPO | Admitting: Family Medicine

## 2021-11-03 VITALS — BP 130/80 | HR 87 | Resp 20 | Ht 67.0 in | Wt 234.0 lb

## 2021-11-03 DIAGNOSIS — E559 Vitamin D deficiency, unspecified: Secondary | ICD-10-CM

## 2021-11-03 DIAGNOSIS — I10 Essential (primary) hypertension: Secondary | ICD-10-CM

## 2021-11-03 DIAGNOSIS — E782 Mixed hyperlipidemia: Secondary | ICD-10-CM

## 2021-11-03 DIAGNOSIS — Z79899 Other long term (current) drug therapy: Secondary | ICD-10-CM

## 2021-11-03 DIAGNOSIS — F418 Other specified anxiety disorders: Secondary | ICD-10-CM

## 2021-11-03 DIAGNOSIS — E669 Obesity, unspecified: Secondary | ICD-10-CM

## 2021-11-03 LAB — CUP PACEART REMOTE DEVICE CHECK
Date Time Interrogation Session: 20230414230558
Implantable Pulse Generator Implant Date: 20220405

## 2021-11-04 ENCOUNTER — Encounter: Payer: Self-pay | Admitting: Family Medicine

## 2021-11-04 MED ORDER — ZOLPIDEM TARTRATE 10 MG PO TABS: 10.0000 mg | ORAL_TABLET | Freq: Every evening | ORAL | 0 refills | Status: DC | PRN

## 2021-11-04 MED ORDER — DIAZEPAM 5 MG PO TABS: 5.0000 mg | ORAL_TABLET | Freq: Two times a day (BID) | ORAL | 0 refills | Status: DC

## 2021-11-19 NOTE — Progress Notes (Signed)
Carelink Summary Report / Loop Recorder 

## 2021-11-25 ENCOUNTER — Ambulatory Visit (INDEPENDENT_AMBULATORY_CARE_PROVIDER_SITE_OTHER): Payer: BC Managed Care – PPO | Admitting: Family Medicine

## 2021-11-25 ENCOUNTER — Encounter: Payer: Self-pay | Admitting: Family Medicine

## 2021-11-25 VITALS — BP 125/77 | HR 85 | Temp 97.5°F | Resp 18 | Ht 67.5 in | Wt 255.0 lb

## 2021-11-25 DIAGNOSIS — I152 Hypertension secondary to endocrine disorders: Secondary | ICD-10-CM | POA: Diagnosis not present

## 2021-11-25 DIAGNOSIS — Z79899 Other long term (current) drug therapy: Secondary | ICD-10-CM | POA: Diagnosis not present

## 2021-11-25 DIAGNOSIS — R109 Unspecified abdominal pain: Secondary | ICD-10-CM

## 2021-11-25 DIAGNOSIS — E1139 Type 2 diabetes mellitus with other diabetic ophthalmic complication: Secondary | ICD-10-CM

## 2021-11-25 DIAGNOSIS — Z7901 Long term (current) use of anticoagulants: Secondary | ICD-10-CM | POA: Insufficient documentation

## 2021-11-25 DIAGNOSIS — E1159 Type 2 diabetes mellitus with other circulatory complications: Secondary | ICD-10-CM | POA: Insufficient documentation

## 2021-11-25 DIAGNOSIS — L4050 Arthropathic psoriasis, unspecified: Secondary | ICD-10-CM

## 2021-11-25 DIAGNOSIS — E1169 Type 2 diabetes mellitus with other specified complication: Secondary | ICD-10-CM | POA: Diagnosis not present

## 2021-11-25 DIAGNOSIS — E785 Hyperlipidemia, unspecified: Secondary | ICD-10-CM | POA: Diagnosis not present

## 2021-11-25 DIAGNOSIS — E119 Type 2 diabetes mellitus without complications: Secondary | ICD-10-CM | POA: Insufficient documentation

## 2021-11-25 DIAGNOSIS — I252 Old myocardial infarction: Secondary | ICD-10-CM

## 2021-11-25 DIAGNOSIS — Z8673 Personal history of transient ischemic attack (TIA), and cerebral infarction without residual deficits: Secondary | ICD-10-CM | POA: Insufficient documentation

## 2021-11-25 DIAGNOSIS — Z794 Long term (current) use of insulin: Secondary | ICD-10-CM | POA: Diagnosis not present

## 2021-11-25 DIAGNOSIS — L409 Psoriasis, unspecified: Secondary | ICD-10-CM

## 2021-11-25 DIAGNOSIS — G8929 Other chronic pain: Secondary | ICD-10-CM | POA: Insufficient documentation

## 2021-11-25 MED ORDER — METHOCARBAMOL 500 MG PO TABS: 500.0000 mg | ORAL_TABLET | Freq: Four times a day (QID) | ORAL | 0 refills | Status: DC

## 2021-11-25 MED ORDER — MELOXICAM 15 MG PO TABS: 15.0000 mg | ORAL_TABLET | Freq: Every day | ORAL | 2 refills | Status: DC

## 2021-11-25 NOTE — Assessment & Plan Note (Signed)
Chronic, elevated ?LDL goal of <70 with hx of MI, CVA, DM ?Currently taking Zetia 10 mg QD ?We recommend diet low in saturated fat and regular exercise - 30 min at least 5 times per week ? ?

## 2021-11-25 NOTE — Assessment & Plan Note (Signed)
Check Aptt/INR, PT given chronic use of brilinta  ?

## 2021-11-25 NOTE — Assessment & Plan Note (Signed)
Chronic, unstable ?Recent ER visit with large amounts of glucose in urine ?Chases his BG with basaglar and lispro; varies his dose daily ?Report FBG of 167 ?Continue to recommend balanced, lower carb meals. Smaller meal size, adding snacks. Choosing water as drink of choice and increasing purposeful exercise. ? ?

## 2021-11-25 NOTE — Assessment & Plan Note (Signed)
Denies residuals ?

## 2021-11-25 NOTE — Assessment & Plan Note (Signed)
Chronic, stable ?Has been using PRN opioid to assist ?Patient made aware that no controlled medication would be provided at first appt ?

## 2021-11-25 NOTE — Assessment & Plan Note (Signed)
LDL goal of <70 ?Hx of MI, CVA, DM ?

## 2021-11-25 NOTE — Assessment & Plan Note (Signed)
Hx of opioids and benzos combined; will do urine drug screen today  ?

## 2021-11-25 NOTE — Assessment & Plan Note (Signed)
On brilinta 60 mg BID ?Followed by cards  ?

## 2021-11-25 NOTE — Assessment & Plan Note (Signed)
Chronic, exacerbation ?Denies use of topical steroid at this time ?Will refer to derm ?

## 2021-11-25 NOTE — Assessment & Plan Note (Signed)
Acute on chronic, denies pain today ?Request opioid; denies ?Will recommend Mobic with muscle relaxant  ?

## 2021-11-25 NOTE — Assessment & Plan Note (Signed)
Chronic, stable; goal <130/<80 ?Denies CP ?Denies SOB/ DOE ?Denies low blood pressure/hypotension ?Denies vision changes ?No LE Edema noted on exam ?Continue medication, MetopXR 25 mg QD ?Denies side effects ?Seek emergent care if you develop chest pain or chest pressure ? ?

## 2021-11-25 NOTE — Progress Notes (Signed)
? ? ? ?I,Roshena L Chambers,acting as a scribe for Gwyneth Sprout, FNP.,have documented all relevant documentation on the behalf of Gwyneth Sprout, FNP,as directed by  Gwyneth Sprout, FNP while in the presence of Gwyneth Sprout, FNP.  ?New patient visit ? ? ?Patient: [name]   DOB: 23-Jul-1960   62 y.o. Male  MRN: [id number] ?Visit Date: 11/25/2021 ? ?Today's healthcare provider: Gwyneth Sprout, FNP  ?Patient presents for new patient visit to establish care.  Introduced to Designer, jewellery role and practice setting.  All questions answered.  Discussed provider/patient relationship and expectations. ? ? ?Chief Complaint  ?Patient presents with  ? Establish Care  ? Back Pain  ? ?Subjective  ?  ?[name] is a 61 y.o. male who presents today as a new patient to establish care. ?Patient presents for new patient visit to establish care.  Introduced to Designer, jewellery role and practice setting.  All questions answered.  Discussed provider/patient relationship and expectations. ? ? ?HPI  ?Back pain: ?Patient complains of intermittent back pain for several months. Patient was seen in the ER for this problem on 10/25/2021. ? ?Past Medical History:  ?Diagnosis Date  ? Anemia 10/03/2009  ? Qualifier: Diagnosis of  By: [name]   ? Anxiety associated with depression 10/11/2013  ? Colon cancer ([place])   ? right colon cancer- adenocarcinoma CEA level isnrmal at 1.8  ? Diabetes mellitus   ? Diabetes mellitus type 2 in obese ([place]) 03/20/2010  ? Qualifier: Diagnosis of  By: [name]    ? Erectile dysfunction 06/22/2016  ? Essential hypertension 06/05/2014  ? FATTY LIVER DISEASE 11/04/2009  ? Qualifier: Diagnosis of  By: [name] MD, Docia Chuck   Qualifier: Diagnosis of  By: [name] MD, Docia Chuck  Last Assessment & Plan:  conirmed by CT scan of abdomen in April 2016 encouraged to minimize simple carbs and fatty foods.  ? GERD 11/04/2009  ? Qualifier: Diagnosis of  By: [name] MD, Docia Chuck   ? Great toe pain, right  04/18/2017  ? Hepatic artery stenosis (HCC)   ? History of colon cancer 01/02/2010  ? Qualifier: Diagnosis of  By: [name] Dr [name]  Last Assessment & Plan:  Follows closely with gastroenterology, Dr [name]  ? Hyperlipidemia   ? Hyperlipidemia, mixed 09/07/2010  ? Qualifier: Diagnosis of  By: [name]   ? Hypertension   ? Hypertriglyceridemia 12/14/2010  ? Hypotestosteronism 06/22/2016  ? INSOMNIA, CHRONIC 03/20/2010  ? Qualifier: Diagnosis of  By: [name] Ambien 10 mg daily does not keep asleep AdvilPM is over sedating and has trouble waking up   ? Iron deficiency anemia 2011  ? Low testosterone 06/22/2016  ? Malignant neoplasm of colon ([place]) 01/02/2010  ? Qualifier: Diagnosis of  By: [name] Dr [name]   ? Muscle spasm of back 06/05/2014  ? Nipple pain 09/30/2016  ? Obesity   ? Palpitations 06/05/2014  ? Preventative health care 10/08/2012  ? Psoriasis   ? Psoriatic arthritis ([place]) 08/25/2017  ? Right knee pain 11/17/2011  ? Stroke [facility])   ? ?Past Surgical History:  ?Procedure Laterality Date  ? BUBBLE STUDY  10/21/2020  ? Procedure: BUBBLE STUDY;  Surgeon: [name], MD;  Location: [place];  Service: Cardiovascular;;  ? CARPAL TUNNEL RELEASE    ? RIGHT  ? CHOLECYSTECTOMY  1994  ? COLONOSCOPY    ? CORONARY/GRAFT ACUTE MI  REVASCULARIZATION N/A 10/27/2019  ? Procedure: Coronary/Graft Acute MI Revascularization;  Surgeon: [name], MD;  Location: [place] [facility];  Service: Cardiovascular;  Laterality: N/A;  ? HEMICOLECTOMY    ? 12/17/2009 right  ? LEFT HEART CATH AND CORONARY ANGIOGRAPHY N/A 10/27/2019  ? Procedure: LEFT HEART CATH AND CORONARY ANGIOGRAPHY;  Surgeon: [name], MD;  Location: [place] [facility];  Service: Cardiovascular;  Laterality: N/A;  ? LOOP RECORDER INSERTION N/A 10/21/2020  ? Procedure: LOOP RECORDER INSERTION;  Surgeon: [name], MD;  Location: [place] [facility];  Service: Cardiovascular;  Laterality: N/A;  ? POLYPECTOMY     ? TEE WITHOUT CARDIOVERSION N/A 10/21/2020  ? Procedure: TRANSESOPHAGEAL ECHOCARDIOGRAM (TEE);  Surgeon: [name], MD;  Location: [place];  Service: Cardiovascular;  Laterality: N/A;  ? ?Family Status  ?Relation Name Status  ? Mother  Deceased at age 87's  ?     Stomach  ? Brother  Alive  ?     35  ? Brother  Deceased  ?     mva  ? Father 25 Deceased  ? Sister  Alive  ? MGM  Deceased  ? MGF  Deceased  ? PGM  Deceased  ? PGF  Deceased  ? Sister  Alive  ? Brother  Alive  ? Neg Hx  (Not Specified)  ? ?Family History  ?Problem Relation Age of Onset  ? Stomach cancer Mother   ?     diedinher 15's  ? Diabetes type II Brother   ?     boderline  ? Diabetes Brother   ?     type II  ? Alcohol abuse Brother   ? Other Neg Hx   ?     cad,prostate ca, colon ca  ? Coronary artery disease Neg Hx   ? Cancer Neg Hx   ?     colon, prostate  ? Colon cancer Neg Hx   ? Esophageal cancer Neg Hx   ? Rectal cancer Neg Hx   ? ?Social History  ? ?Socioeconomic History  ? Marital status: Single  ?  Spouse name: [name]  ? Number of children: 0  ? Years of education: Not on file  ? Highest education level: Not on file  ?Occupational History  ? Occupation: Merchant navy officer  ?  Comment: printers and fax  ?Tobacco Use  ? Smoking status: Former  ?  Types: Cigarettes  ?  Quit date: 10/27/2019  ?  Years since quitting: 2.0  ? Smokeless tobacco: Never  ?Vaping Use  ? Vaping Use: Never used  ?Substance and Sexual Activity  ? Alcohol use: No  ? Drug use: No  ? Sexual activity: Yes  ?  Comment: lives with girlfriend, no dietary restrictions  ?Other Topics Concern  ? Not on file  ?Social History Narrative  ? Occupation: Insurance claims handler)  ? Single  (lives with [name])  ? no children   ? former smoker  ? Illicit Drug Use - no   ? ?Social Determinants of Health  ? ?Financial Resource Strain: Not on file  ?Food Insecurity: Not on file  ?Transportation Needs: Not on file  ?Physical Activity: Not on file  ?Stress: Not on file  ?Social Connections:  Not on file  ? ?Outpatient Medications Prior to Visit  ?Medication Sig  ? aspirin EC 81 MG tablet Take 1 tablet (81 mg total) by mouth daily.  ? Blood Glucose Monitoring Suppl (CONTOUR NEXT EZ) w/Device KIT See  admin instructions.  ? diazepam (VALIUM) 5 MG tablet Take 1 tablet (5 mg total) by mouth every 12 (twelve) hours.  ? ezetimibe (ZETIA) 10 MG tablet Take 1 tablet (10 mg total) by mouth once daily.  ? glucose blood (CONTOUR NEXT TEST) test strip Use as directed to check blood sugar 3 times daily.  ? Insulin Glargine (BASAGLAR KWIKPEN) 100 UNIT/ML Inject 43 Units into the skin once daily.  ? insulin lispro (HUMALOG KWIKPEN) 100 UNIT/ML KwikPen Check blood sugar and inject subcutaneously 3 times daily before meals as directed per sliding scale below: ?<150-0 units ?150-200 2 units ?201-250 4 units ?251-300 6 units ?301-350 8 units ?351-400 10 units ?>400 12 units and contact us.  ? Insulin Pen Needle 32G X 4 MM MISC Use as directed with insulin  ? lidocaine (LIDODERM) 5 % Place 1 patch onto the skin every 12 (twelve) hours. Remove & Discard patch within 12 hours or as directed by MD  ? melatonin 5 MG TABS Take 1 tablet (5 mg total) by mouth at bedtime.  ? metoprolol succinate (TOPROL XL) 25 MG 24 hr tablet Take 1 tablet (25 mg total) by mouth once daily.  ? Multiple Vitamins-Minerals (MULTIVITAMIN ADULT, MINERALS,) TABS Take 1 tablet by mouth daily.  ? potassium chloride (KLOR-CON) 10 MEQ tablet Take 1 tablet (10 mEq total) by mouth daily.  ? ticagrelor (BRILINTA) 60 MG TABS tablet Take 1 tablet (60 mg total) by mouth 2 (two) times daily.  ? ticagrelor (BRILINTA) 60 MG TABS tablet Take 1 tablet (60 mg total) by mouth 2 (two) times daily.  ? Vitamin D, Ergocalciferol, (DRISDOL) 1.25 MG (50000 UNIT) CAPS capsule Take 1 capsule (50,000 Units total) by mouth once every 7 days.  ? zolpidem (AMBIEN) 10 MG tablet Take 1 tablet (10 mg total) by mouth at bedtime as needed.  ? [DISCONTINUED] clobetasol cream (TEMOVATE)  7.67 % Apply 1 application topically at bedtime as needed (psoriasis).  ? [DISCONTINUED] cyclobenzaprine (FLEXERIL) 5 MG tablet Take 1 tablet (5 mg total) by mouth 3 (three) times daily as needed.  ? ?No facilit

## 2021-11-26 LAB — COMPREHENSIVE METABOLIC PANEL
ALT: 12 IU/L (ref 0–44)
AST: 12 IU/L (ref 0–40)
Albumin/Globulin Ratio: 1.6 (ref 1.2–2.2)
Albumin: 4.2 g/dL (ref 3.8–4.9)
Alkaline Phosphatase: 54 IU/L (ref 44–121)
BUN/Creatinine Ratio: 15 (ref 9–20)
BUN: 13 mg/dL (ref 6–24)
Bilirubin Total: 1.3 mg/dL — ABNORMAL HIGH (ref 0.0–1.2)
CO2: 22 mmol/L (ref 20–29)
Calcium: 8.9 mg/dL (ref 8.7–10.2)
Chloride: 103 mmol/L (ref 96–106)
Creatinine, Ser: 0.87 mg/dL (ref 0.76–1.27)
Globulin, Total: 2.6 g/dL (ref 1.5–4.5)
Glucose: 149 mg/dL — ABNORMAL HIGH (ref 70–99)
Potassium: 4.3 mmol/L (ref 3.5–5.2)
Sodium: 139 mmol/L (ref 134–144)
Total Protein: 6.8 g/dL (ref 6.0–8.5)
eGFR: 99 mL/min/{1.73_m2} (ref >59)

## 2021-11-26 LAB — CBC WITH DIFFERENTIAL/PLATELET
Basophils Absolute: 0.1 10*3/uL (ref 0.0–0.2)
Basos: 1 %
EOS (ABSOLUTE): 0.3 10*3/uL (ref 0.0–0.4)
Eos: 3 %
Hematocrit: 41.3 % (ref 37.5–51.0)
Hemoglobin: 14.5 g/dL (ref 13.0–17.7)
Immature Grans (Abs): 0 10*3/uL (ref 0.0–0.1)
Immature Granulocytes: 0 %
Lymphocytes Absolute: 4 10*3/uL — ABNORMAL HIGH (ref 0.7–3.1)
Lymphs: 39 %
MCH: 32.4 pg (ref 26.6–33.0)
MCHC: 35.1 g/dL (ref 31.5–35.7)
MCV: 92 fL (ref 79–97)
Monocytes Absolute: 0.9 10*3/uL (ref 0.1–0.9)
Monocytes: 9 %
Neutrophils Absolute: 4.9 10*3/uL (ref 1.4–7.0)
Neutrophils: 48 %
Platelets: 317 10*3/uL (ref 150–450)
RBC: 4.47 x10E6/uL (ref 4.14–5.80)
RDW: 12.5 % (ref 11.6–15.4)
WBC: 10.2 10*3/uL (ref 3.4–10.8)

## 2021-11-26 LAB — PROTIME-INR
INR: 1 (ref 0.9–1.2)
Prothrombin Time: 11 s (ref 9.1–12.0)

## 2021-11-26 LAB — MICROALBUMIN / CREATININE URINE RATIO
Creatinine, Urine: 147.7 mg/dL
Microalb/Creat Ratio: <2 mg/g creat (ref 0–29)
Microalbumin, Urine: <3 ug/mL

## 2021-11-26 LAB — HEMOGLOBIN A1C
Est. average glucose Bld gHb Est-mCnc: 177 mg/dL
Hgb A1c MFr Bld: 7.8 % — ABNORMAL HIGH (ref 4.8–5.6)

## 2021-11-26 LAB — LIPID PANEL
Chol/HDL Ratio: 3.4 ratio (ref 0.0–5.0)
Cholesterol, Total: 164 mg/dL (ref 100–199)
HDL: 48 mg/dL (ref >39)
LDL Chol Calc (NIH): 90 mg/dL (ref 0–99)
Triglycerides: 149 mg/dL (ref 0–149)
VLDL Cholesterol Cal: 26 mg/dL (ref 5–40)

## 2021-11-27 LAB — PAIN MGT SCRN (14 DRUGS), UR
Amphetamine Scrn, Ur: NEGATIVE ng/mL
BARBITURATE SCREEN URINE: NEGATIVE ng/mL
BENZODIAZEPINE SCREEN, URINE: POSITIVE ng/mL — AB
Buprenorphine, Urine: NEGATIVE ng/mL
CANNABINOIDS UR QL SCN: NEGATIVE ng/mL
Cocaine (Metab) Scrn, Ur: NEGATIVE ng/mL
Creatinine(Crt), U: 148.4 mg/dL (ref 20.0–300.0)
Fentanyl, Urine: NEGATIVE pg/mL
Meperidine Screen, Urine: NEGATIVE ng/mL
Methadone Screen, Urine: NEGATIVE ng/mL
OXYCODONE+OXYMORPHONE UR QL SCN: NEGATIVE ng/mL
Opiate Scrn, Ur: NEGATIVE ng/mL
Ph of Urine: 5 (ref 4.5–8.9)
Phencyclidine Qn, Ur: NEGATIVE ng/mL
Propoxyphene Scrn, Ur: NEGATIVE ng/mL
Tramadol Screen, Urine: NEGATIVE ng/mL

## 2021-12-04 LAB — CUP PACEART REMOTE DEVICE CHECK
Date Time Interrogation Session: 20230517230930
Implantable Pulse Generator Implant Date: 20220405

## 2021-12-07 ENCOUNTER — Ambulatory Visit (INDEPENDENT_AMBULATORY_CARE_PROVIDER_SITE_OTHER): Payer: BC Managed Care – PPO

## 2021-12-07 DIAGNOSIS — I255 Ischemic cardiomyopathy: Secondary | ICD-10-CM

## 2021-12-21 DIAGNOSIS — Z79899 Other long term (current) drug therapy: Secondary | ICD-10-CM | POA: Diagnosis not present

## 2021-12-21 DIAGNOSIS — L4050 Arthropathic psoriasis, unspecified: Secondary | ICD-10-CM | POA: Diagnosis not present

## 2021-12-21 DIAGNOSIS — L409 Psoriasis, unspecified: Secondary | ICD-10-CM | POA: Diagnosis not present

## 2021-12-23 NOTE — Progress Notes (Signed)
Carelink Summary Report / Loop Recorder 

## 2021-12-23 NOTE — Progress Notes (Signed)
Established patient visit   Patient: [name]   DOB: 1960-10-28   61 y.o. Male  MRN: [id number] Visit Date: 12/28/2021  Today's healthcare provider: Gwyneth Sprout, FNP  Re Introduced to nurse practitioner role and practice setting.  All questions answered.  Discussed provider/patient relationship and expectations.  I,Tiffany J Bragg,acting as a scribe for Gwyneth Sprout, FNP.,have documented all relevant documentation on the behalf of Gwyneth Sprout, FNP,as directed by  Gwyneth Sprout, FNP while in the presence of Gwyneth Sprout, FNP.   Chief Complaint  Patient presents with   Diabetes   Hypertension   Hyperlipidemia   Subjective    HPI  Diabetes Mellitus Type II, follow-up  Lab Results  Component Value Date   HGBA1C 7.8 (H) 11/25/2021   HGBA1C 6.7 (H) 03/13/2021   HGBA1C 8.7 (H) 10/19/2020   Last seen for diabetes 1 months ago.  Management since then includes continue medications and diet recommendations He reports excellent compliance with treatment. He is not having side effects.   Home blood sugar records:  between 100-200  Episodes of hypoglycemia? No    Current insulin regiment: basaglar once daily, humalog 3X daily Most Recent Eye Exam: 06/2021  --------------------------------------------------------------------------------------------------- Hypertension, follow-up  BP Readings from Last 3 Encounters:  12/28/21 (!) 142/80  11/25/21 125/77  11/03/21 130/80   Wt Readings from Last 3 Encounters:  12/28/21 228 lb 8 oz (103.6 kg)  11/25/21 255 lb (115.7 kg)  11/03/21 234 lb (106.1 kg)     He was last seen for hypertension 1 months ago.  BP at that visit was 125/77. Management since that visit includes continue medication. He reports excellent compliance with treatment. He is not having side effects.  He is not exercising. He is not adherent to low salt diet.   Outside blood pressures are not checked.  He does not smoke.  Use of agents  associated with hypertension: none.   --------------------------------------------------------------------------------------------------- Lipid/Cholesterol, follow-up  Last Lipid Panel: Lab Results  Component Value Date   CHOL 164 11/25/2021   LDLCALC 90 11/25/2021   LDLDIRECT 76.0 03/13/2021   HDL 48 11/25/2021   TRIG 149 11/25/2021    He was last seen for this 1 months ago.  Management since that visit includes continue medications.  He reports excellent compliance with treatment. He is not having side effects.   Symptoms: No appetite changes No foot ulcerations  No chest pain No chest pressure/discomfort  No dyspnea Yes orthopnea  No fatigue No lower extremity edema  No palpitations No paroxysmal nocturnal dyspnea  No nausea No numbness or tingling of extremity  No polydipsia No polyuria  No speech difficulty No syncope   He is following a Low Sodium diet. Current exercise: none  Last metabolic panel Lab Results  Component Value Date   GLUCOSE 149 (H) 11/25/2021   NA 139 11/25/2021   K 4.3 11/25/2021   BUN 13 11/25/2021   CREATININE 0.87 11/25/2021   EGFR 99 11/25/2021   GFRNONAA >60 10/25/2021   CALCIUM 8.9 11/25/2021   AST 12 11/25/2021   ALT 12 11/25/2021   The ASCVD Risk score (Arnett DK, et al., 2019) failed to calculate for the following reasons:   The patient has a prior MI or stroke diagnosis  ---------------------------------------------------------------------------------------------------  -----------------------------------------------------------------------------------------   Medications: Outpatient Medications Prior to Visit  Medication Sig   aspirin EC 81 MG tablet Take 1 tablet (81 mg total) by mouth daily.   Blood Glucose  Monitoring Suppl (CONTOUR NEXT EZ) w/Device KIT See admin instructions.   diazepam (VALIUM) 5 MG tablet Take 1 tablet (5 mg total) by mouth every 12 (twelve) hours.   ezetimibe (ZETIA) 10 MG tablet Take 1 tablet (10  mg total) by mouth once daily.   glucose blood (CONTOUR NEXT TEST) test strip Use as directed to check blood sugar 3 times daily.   lidocaine (LIDODERM) 5 % Place 1 patch onto the skin every 12 (twelve) hours. Remove & Discard patch within 12 hours or as directed by MD   melatonin 5 MG TABS Take 1 tablet (5 mg total) by mouth at bedtime.   meloxicam (MOBIC) 15 MG tablet Take 1 tablet (15 mg total) by mouth daily.   methocarbamol (ROBAXIN) 500 MG tablet Take 1 tablet (500 mg total) by mouth 4 (four) times daily.   metoprolol succinate (TOPROL XL) 25 MG 24 hr tablet Take 1 tablet (25 mg total) by mouth once daily.   Multiple Vitamins-Minerals (MULTIVITAMIN ADULT, MINERALS,) TABS Take 1 tablet by mouth daily.   potassium chloride (KLOR-CON) 10 MEQ tablet Take 1 tablet (10 mEq total) by mouth daily.   ticagrelor (BRILINTA) 60 MG TABS tablet Take 1 tablet (60 mg total) by mouth 2 (two) times daily.   ticagrelor (BRILINTA) 60 MG TABS tablet Take 1 tablet (60 mg total) by mouth 2 (two) times daily.   Vitamin D, Ergocalciferol, (DRISDOL) 1.25 MG (50000 UNIT) CAPS capsule Take 1 capsule (50,000 Units total) by mouth once every 7 days.   zolpidem (AMBIEN) 10 MG tablet Take 1 tablet (10 mg total) by mouth at bedtime as needed.   [DISCONTINUED] Insulin Glargine (BASAGLAR KWIKPEN) 100 UNIT/ML Inject 43 Units into the skin once daily.   [DISCONTINUED] insulin lispro (HUMALOG KWIKPEN) 100 UNIT/ML KwikPen Check blood sugar and inject subcutaneously 3 times daily before meals as directed per sliding scale below: <150-0 units 150-200 2 units 201-250 4 units 251-300 6 units 301-350 8 units 351-400 10 units >400 12 units and contact us.   [DISCONTINUED] Insulin Pen Needle 32G X 4 MM MISC Use as directed with insulin   No facility-administered medications prior to visit.    Review of Systems  Last CBC Lab Results  Component Value Date   WBC 10.2 11/25/2021   HGB 14.5 11/25/2021   HCT 41.3 11/25/2021    MCV 92 11/25/2021   MCH 32.4 11/25/2021   RDW 12.5 11/25/2021   PLT 317 29/79/8921   Last metabolic panel Lab Results  Component Value Date   GLUCOSE 149 (H) 11/25/2021   NA 139 11/25/2021   K 4.3 11/25/2021   CL 103 11/25/2021   CO2 22 11/25/2021   BUN 13 11/25/2021   CREATININE 0.87 11/25/2021   EGFR 99 11/25/2021   CALCIUM 8.9 11/25/2021   PHOS 2.8 10/29/2014   PROT 6.8 11/25/2021   ALBUMIN 4.2 11/25/2021   LABGLOB 2.6 11/25/2021   AGRATIO 1.6 11/25/2021   BILITOT 1.3 (H) 11/25/2021   ALKPHOS 54 11/25/2021   AST 12 11/25/2021   ALT 12 11/25/2021   ANIONGAP 7 10/25/2021   Last lipids Lab Results  Component Value Date   CHOL 164 11/25/2021   HDL 48 11/25/2021   LDLCALC 90 11/25/2021   LDLDIRECT 76.0 03/13/2021   TRIG 149 11/25/2021   CHOLHDL 3.4 11/25/2021   Last hemoglobin A1c Lab Results  Component Value Date   HGBA1C 7.8 (H) 11/25/2021   Last thyroid functions Lab Results  Component Value Date  TSH 2.197 10/20/2020       Objective    BP (!) 142/80 (BP Location: Left Arm, Patient Position: Sitting, Cuff Size: Normal)   Pulse 88   Temp 97.8 F (36.6 C) (Oral)   Resp 16   Ht '5\' 8"'  (1.727 m)   Wt 228 lb 8 oz (103.6 kg)   SpO2 99%   BMI 34.74 kg/m  BP Readings from Last 3 Encounters:  12/28/21 (!) 142/80  11/25/21 125/77  11/03/21 130/80   Wt Readings from Last 3 Encounters:  12/28/21 228 lb 8 oz (103.6 kg)  11/25/21 255 lb (115.7 kg)  11/03/21 234 lb (106.1 kg)   SpO2 Readings from Last 3 Encounters:  12/28/21 99%  11/25/21 99%  11/03/21 96%      Physical Exam Vitals and nursing note reviewed.  Constitutional:      Appearance: Normal appearance. He is obese.  HENT:     Head: Normocephalic and atraumatic.  Eyes:     Pupils: Pupils are equal, round, and reactive to light.  Cardiovascular:     Rate and Rhythm: Normal rate and regular rhythm.     Pulses: Normal pulses.     Heart sounds: Normal heart sounds.  Pulmonary:      Effort: Pulmonary effort is normal.     Breath sounds: Normal breath sounds.  Musculoskeletal:        General: Normal range of motion.  Skin:    General: Skin is warm and dry.     Capillary Refill: Capillary refill takes less than 2 seconds.  Neurological:     General: No focal deficit present.     Mental Status: He is alert and oriented to person, place, and time. Mental status is at baseline.  Psychiatric:        Mood and Affect: Mood normal. Affect is blunt.        Behavior: Behavior normal.        Thought Content: Thought content normal.        Judgment: Judgment normal.      No results found for any visits on 12/28/21.  Assessment & Plan   Problem List Items Addressed This Visit       Cardiovascular and Mediastinum   Hypertension associated with diabetes ([place])    Chronic, elevated; recommend additional of Lisinopril with diuretic, HCTZ to assist meeting BP goal of <130/<80 Patient notes some indiscretions in his diet and does not wish to have a poor quality of living       Relevant Medications   Semaglutide, 1 MG/DOSE, 4 MG/3ML SOPN   Insulin Glargine (BASAGLAR KWIKPEN) 100 UNIT/ML   metFORMIN (GLUCOPHAGE-XR) 750 MG 24 hr tablet   lisinopril-hydrochlorothiazide (ZESTORETIC) 20-25 MG tablet   rosuvastatin (CRESTOR) 40 MG tablet   Continuous Blood Gluc Sensor (FREESTYLE LIBRE 3 SENSOR) MISC   Continuous Blood Gluc Receiver (FREESTYLE LIBRE 14 DAY READER) DEVI     Endocrine   Diabetes mellitus ([place]) - Primary    Chronic, remains elevated; with associated HYPERGLYCEMIA  Previously had variable quick and long acting insulin Was using 60U/basal basaglar; order was previously for 43u/night  However, was using minimum of 5U/meal, when he voiced he ate bad; was Rx's 2u: 50 mg/dL >150 However, he is unable to quantify this to number of times/use per week Would recommend titration of Metformin back to goal if pt does not wish to check BG 4x/day or more; we will check on CGM  coverage  Relevant Medications   Semaglutide, 1 MG/DOSE, 4 MG/3ML SOPN   Insulin Pen Needle 32G X 8 MM MISC   Insulin Glargine (BASAGLAR KWIKPEN) 100 UNIT/ML   metFORMIN (GLUCOPHAGE-XR) 750 MG 24 hr tablet   lisinopril-hydrochlorothiazide (ZESTORETIC) 20-25 MG tablet   rosuvastatin (CRESTOR) 40 MG tablet   Continuous Blood Gluc Sensor (FREESTYLE LIBRE 3 SENSOR) MISC   Continuous Blood Gluc Receiver (FREESTYLE LIBRE 14 DAY READER) DEVI   Hyperlipidemia associated with type 2 diabetes mellitus (HCC)    Chronic, remains elevated Review of labs from 11/2021 shows LDL of 90 goal of LDL of <70 with hx of MI, CVA, DM Currently taking Zetia 10 mg QD Can refer out to speciality clinic as well. Would recommend additional of statin.        Relevant Medications   Semaglutide, 1 MG/DOSE, 4 MG/3ML SOPN   Insulin Glargine (BASAGLAR KWIKPEN) 100 UNIT/ML   metFORMIN (GLUCOPHAGE-XR) 750 MG 24 hr tablet   lisinopril-hydrochlorothiazide (ZESTORETIC) 20-25 MG tablet   rosuvastatin (CRESTOR) 40 MG tablet   Continuous Blood Gluc Sensor (FREESTYLE LIBRE 3 SENSOR) MISC   Continuous Blood Gluc Receiver (FREESTYLE LIBRE 14 DAY READER) DEVI     Return in about 2 months (around 02/27/2022) for chonic disease management.      Vonna Kotyk, FNP, have reviewed all documentation for this visit. The documentation on 12/28/21 for the exam, diagnosis, procedures, and orders are all accurate and complete.    Gwyneth Sprout, [place] [phone] (phone) [phone] (fax)  [place]

## 2021-12-28 ENCOUNTER — Encounter: Payer: Self-pay | Admitting: Family Medicine

## 2021-12-28 ENCOUNTER — Ambulatory Visit (INDEPENDENT_AMBULATORY_CARE_PROVIDER_SITE_OTHER): Payer: BC Managed Care – PPO | Admitting: Family Medicine

## 2021-12-28 VITALS — BP 142/80 | HR 88 | Temp 97.8°F | Resp 16 | Ht 68.0 in | Wt 228.5 lb

## 2021-12-28 DIAGNOSIS — E1165 Type 2 diabetes mellitus with hyperglycemia: Secondary | ICD-10-CM

## 2021-12-28 DIAGNOSIS — E1169 Type 2 diabetes mellitus with other specified complication: Secondary | ICD-10-CM

## 2021-12-28 DIAGNOSIS — E1159 Type 2 diabetes mellitus with other circulatory complications: Secondary | ICD-10-CM

## 2021-12-28 DIAGNOSIS — E785 Hyperlipidemia, unspecified: Secondary | ICD-10-CM

## 2021-12-28 DIAGNOSIS — I152 Hypertension secondary to endocrine disorders: Secondary | ICD-10-CM

## 2021-12-28 MED ORDER — FREESTYLE LIBRE 3 SENSOR MISC: 1.0000 | 11 refills | Status: DC

## 2021-12-28 MED ORDER — ROSUVASTATIN CALCIUM 40 MG PO TABS: 40.0000 mg | ORAL_TABLET | Freq: Every evening | ORAL | 1 refills | Status: DC

## 2021-12-28 MED ORDER — METFORMIN HCL ER 750 MG PO TB24: 750.0000 mg | ORAL_TABLET | Freq: Every day | ORAL | 0 refills | Status: DC

## 2021-12-28 MED ORDER — FREESTYLE LIBRE 14 DAY SENSOR MISC: 1.0000 | 11 refills | Status: DC

## 2021-12-28 MED ORDER — SEMAGLUTIDE (1 MG/DOSE) 4 MG/3ML ~~LOC~~ SOPN: 1.0000 mg | PEN_INJECTOR | SUBCUTANEOUS | 0 refills | Status: DC

## 2021-12-28 MED ORDER — BASAGLAR KWIKPEN 100 UNIT/ML ~~LOC~~ SOPN: 60.0000 [IU] | PEN_INJECTOR | Freq: Every day | SUBCUTANEOUS | 0 refills | Status: DC

## 2021-12-28 MED ORDER — LISINOPRIL-HYDROCHLOROTHIAZIDE 20-25 MG PO TABS: 1.0000 | ORAL_TABLET | Freq: Every day | ORAL | 3 refills | Status: DC

## 2021-12-28 MED ORDER — INSULIN PEN NEEDLE 32G X 8 MM MISC: 1.0000 | Freq: Every day | 3 refills | Status: DC

## 2021-12-28 MED ORDER — FREESTYLE LIBRE 14 DAY READER DEVI: 1.0000 | 11 refills | Status: DC

## 2021-12-28 NOTE — Assessment & Plan Note (Signed)
Chronic, remains elevated Review of labs from 11/2021 shows LDL of 90 goal of LDL of <70 with hx of MI, CVA, DM Currently taking Zetia 10 mg QD Can refer out to speciality clinic as well. Would recommend additional of statin.

## 2021-12-28 NOTE — Assessment & Plan Note (Signed)
Chronic, elevated; recommend additional of Lisinopril with diuretic, HCTZ to assist meeting BP goal of <130/<80 Patient notes some indiscretions in his diet and does not wish to have a poor quality of living

## 2021-12-28 NOTE — Assessment & Plan Note (Addendum)
Chronic, remains elevated; with associated HYPERGLYCEMIA  Previously had variable quick and long acting insulin Was using 60U/basal basaglar; order was previously for 43u/night  However, was using minimum of 5U/meal, when he voiced he ate bad; was Rx's 2u: 50 mg/dL >150 However, he is unable to quantify this to number of times/use per week Would recommend titration of Metformin back to goal if pt does not wish to check BG 4x/day or more; we will check on CGM coverage

## 2022-01-11 ENCOUNTER — Ambulatory Visit (INDEPENDENT_AMBULATORY_CARE_PROVIDER_SITE_OTHER): Payer: BC Managed Care – PPO

## 2022-01-11 DIAGNOSIS — I255 Ischemic cardiomyopathy: Secondary | ICD-10-CM

## 2022-01-12 LAB — CUP PACEART REMOTE DEVICE CHECK
Date Time Interrogation Session: 20230619230337
Implantable Pulse Generator Implant Date: 20220405

## 2022-01-20 ENCOUNTER — Other Ambulatory Visit: Payer: Self-pay | Admitting: Family Medicine

## 2022-01-20 DIAGNOSIS — E1165 Type 2 diabetes mellitus with hyperglycemia: Secondary | ICD-10-CM

## 2022-01-20 NOTE — Telephone Encounter (Signed)
Requested Prescriptions  Pending Prescriptions Disp Refills  . OZEMPIC, 1 MG/DOSE, 4 MG/3ML SOPN [Pharmacy Med Name: OZEMPIC '1MG'$  PER DOSE (1X'4MG'$  PEN)] 3 mL 0    Sig: INJECT 1 MG UNDER THE SKIN ONCE A WEEK     Endocrinology:  Diabetes - GLP-1 Receptor Agonists - semaglutide Failed - 01/20/2022  8:17 AM      Failed - HBA1C in normal range and within 180 days    Hemoglobin A1C  Date Value Ref Range Status  10/27/2014 11.8 (H) % Final    Comment:    4.0-6.0 NOTE: New Reference Range  09/24/14    Hgb A1c MFr Bld  Date Value Ref Range Status  11/25/2021 7.8 (H) 4.8 - 5.6 % Final    Comment:             Prediabetes: 5.7 - 6.4          Diabetes: >6.4          Glycemic control for adults with diabetes: <7.0          Passed - Cr in normal range and within 360 days    Creatinine  Date Value Ref Range Status  10/30/2014 0.63 mg/dL Final    Comment:    0.61-1.24 NOTE: New Reference Range  09/24/14    Creat  Date Value Ref Range Status  10/11/2013 0.76 0.50 - 1.35 mg/dL Final   Creatinine, Ser  Date Value Ref Range Status  11/25/2021 0.87 0.76 - 1.27 mg/dL Final   Creatinine, Urine  Date Value Ref Range Status  06/19/2012 138.4 mg/dL Final         Passed - Valid encounter within last 6 months    Recent Outpatient Visits          3 weeks ago Type 2 diabetes mellitus with hyperglycemia, without long-term current use of insulin [facility])   [facility] Tally Joe T, FNP   1 month ago Right flank pain, chronic   [facility] Gwyneth Sprout, FNP      Future Appointments            In 1 month Gwyneth Sprout, [place], [place]   In 2 months Ralene Bathe, MD [place]

## 2022-02-04 ENCOUNTER — Encounter: Payer: Self-pay | Admitting: Internal Medicine

## 2022-02-05 NOTE — Progress Notes (Signed)
Carelink Summary Report / Loop Recorder 

## 2022-02-11 LAB — CUP PACEART REMOTE DEVICE CHECK
Date Time Interrogation Session: 20230722230402
Implantable Pulse Generator Implant Date: 20220405

## 2022-02-15 ENCOUNTER — Ambulatory Visit (INDEPENDENT_AMBULATORY_CARE_PROVIDER_SITE_OTHER): Payer: BC Managed Care – PPO

## 2022-02-15 DIAGNOSIS — I255 Ischemic cardiomyopathy: Secondary | ICD-10-CM | POA: Diagnosis not present

## 2022-03-02 ENCOUNTER — Ambulatory Visit: Payer: Self-pay | Admitting: Family Medicine

## 2022-03-04 NOTE — Progress Notes (Unsigned)
Established patient visit   Patient: [name]   DOB: October 05, 1960   61 y.o. Male  MRN: [id number] Visit Date: 03/10/2022  Today's healthcare provider: Gwyneth Sprout, FNP   No chief complaint on file.  Subjective    HPI  Diabetes Mellitus Type II, follow-up  Lab Results  Component Value Date   HGBA1C 7.8 (H) 11/25/2021   HGBA1C 6.7 (H) 03/13/2021   HGBA1C 8.7 (H) 10/19/2020   Last seen for diabetes 2 months ago.  Management since then includes continuing the same treatment. He reports {excellent/good/fair/poor:19665} compliance with treatment. He {is/is not:21021397} having side effects. {document side effects if present:1}  Home blood sugar records: {diabetes glucometry results:16657}  Episodes of hypoglycemia? {Yes/No:20286} {enter details if yes:1}   Current insulin regiment: {***Type 'None' if not taking insulin                                                otherwise enter complete                                                 details of insulin regiment:1} Most Recent Eye Exam: ***  --------------------------------------------------------------------------------------------------- Hypertension, follow-up  BP Readings from Last 3 Encounters:  12/28/21 (!) 142/80  11/25/21 125/77  11/03/21 130/80   Wt Readings from Last 3 Encounters:  12/28/21 228 lb 8 oz (103.6 kg)  11/25/21 255 lb (115.7 kg)  11/03/21 234 lb (106.1 kg)     He was last seen for hypertension 2 months ago.  BP at that visit was 142/80. Management since that visit includes added lisinipril. He reports {excellent/good/fair/poor:19665} compliance with treatment. He {is/is not:9024} having side effects. {document side effects if present:1} He {is/is not:9024} exercising. He {is/is not:9024} adherent to low salt diet.   Outside blood pressures are {enter patient reported home BP, or 'not being checked':1}.  He {does/does not:200015} smoke.  Use of agents associated with  hypertension: {bp agents assoc with hypertension:511::"none"}.   --------------------------------------------------------------------------------------------------- Lipid/Cholesterol, follow-up  Last Lipid Panel: Lab Results  Component Value Date   CHOL 164 11/25/2021   LDLCALC 90 11/25/2021   LDLDIRECT 76.0 03/13/2021   HDL 48 11/25/2021   TRIG 149 11/25/2021    He was last seen for this 2 months ago.  Management since that visit includes added a statin.  He reports {excellent/good/fair/poor:19665} compliance with treatment. He {is/is not:9024} having side effects. {document side effects if present:1}  Symptoms: {Yes/No:20286} appetite changes {Yes/No:20286} foot ulcerations  {Yes/No:20286} chest pain {Yes/No:20286} chest pressure/discomfort  {Yes/No:20286} dyspnea {Yes/No:20286} orthopnea  {Yes/No:20286} fatigue {Yes/No:20286} lower extremity edema  {Yes/No:20286} palpitations {Yes/No:20286} paroxysmal nocturnal dyspnea  {Yes/No:20286} nausea {Yes/No:20286} numbness or tingling of extremity  {Yes/No:20286} polydipsia {Yes/No:20286} polyuria  {Yes/No:20286} speech difficulty {Yes/No:20286} syncope   He is following a {diet:21022986} diet. Current exercise: {exercise JSEGB:15176}  Last metabolic panel Lab Results  Component Value Date   GLUCOSE 149 (H) 11/25/2021   NA 139 11/25/2021   K 4.3 11/25/2021   BUN 13 11/25/2021   CREATININE 0.87 11/25/2021   EGFR 99 11/25/2021   GFRNONAA >60 10/25/2021   CALCIUM 8.9 11/25/2021   AST 12 11/25/2021   ALT 12 11/25/2021   The ASCVD Risk score (Arnett  DK, et al., 2019) failed to calculate for the following reasons:   The patient has a prior MI or stroke diagnosis  ---------------------------------------------------------------------------------------------------   Medications: Outpatient Medications Prior to Visit  Medication Sig   aspirin EC 81 MG tablet Take 1 tablet (81 mg total) by mouth daily.   Blood Glucose  Monitoring Suppl (CONTOUR NEXT EZ) w/Device KIT See admin instructions.   Continuous Blood Gluc Receiver (FREESTYLE LIBRE 14 DAY READER) DEVI 1 each by Does not apply route every 14 (fourteen) days.   Continuous Blood Gluc Sensor (FREESTYLE LIBRE 3 SENSOR) MISC 1 each by Does not apply route every 14 (fourteen) days. Place 1 sensor on the skin every 14 days. Use to check glucose continuously   diazepam (VALIUM) 5 MG tablet Take 1 tablet (5 mg total) by mouth every 12 (twelve) hours.   ezetimibe (ZETIA) 10 MG tablet Take 1 tablet (10 mg total) by mouth once daily.   glucose blood (CONTOUR NEXT TEST) test strip Use as directed to check blood sugar 3 times daily.   Insulin Glargine (BASAGLAR KWIKPEN) 100 UNIT/ML Inject 60 Units into the skin at bedtime. Titrate to Fasting Blood Sugar of 100- 150 mg/dL with morning blood sugar check.   Insulin Pen Needle 32G X 8 MM MISC 1 each by Does not apply route at bedtime.   lidocaine (LIDODERM) 5 % Place 1 patch onto the skin every 12 (twelve) hours. Remove & Discard patch within 12 hours or as directed by MD   lisinopril-hydrochlorothiazide (ZESTORETIC) 20-25 MG tablet Take 1 tablet by mouth daily.   melatonin 5 MG TABS Take 1 tablet (5 mg total) by mouth at bedtime.   meloxicam (MOBIC) 15 MG tablet Take 1 tablet (15 mg total) by mouth daily.   metFORMIN (GLUCOPHAGE-XR) 750 MG 24 hr tablet Take 1 tablet (750 mg total) by mouth daily with breakfast.   methocarbamol (ROBAXIN) 500 MG tablet Take 1 tablet (500 mg total) by mouth 4 (four) times daily.   metoprolol succinate (TOPROL XL) 25 MG 24 hr tablet Take 1 tablet (25 mg total) by mouth once daily.   Multiple Vitamins-Minerals (MULTIVITAMIN ADULT, MINERALS,) TABS Take 1 tablet by mouth daily.   OZEMPIC, 1 MG/DOSE, 4 MG/3ML SOPN INJECT 1 MG UNDER THE SKIN ONCE A WEEK   potassium chloride (KLOR-CON) 10 MEQ tablet Take 1 tablet (10 mEq total) by mouth daily.   rosuvastatin (CRESTOR) 40 MG tablet Take 1 tablet (40  mg total) by mouth every evening.   ticagrelor (BRILINTA) 60 MG TABS tablet Take 1 tablet (60 mg total) by mouth 2 (two) times daily.   ticagrelor (BRILINTA) 60 MG TABS tablet Take 1 tablet (60 mg total) by mouth 2 (two) times daily.   Vitamin D, Ergocalciferol, (DRISDOL) 1.25 MG (50000 UNIT) CAPS capsule Take 1 capsule (50,000 Units total) by mouth once every 7 days.   zolpidem (AMBIEN) 10 MG tablet Take 1 tablet (10 mg total) by mouth at bedtime as needed.   No facility-administered medications prior to visit.    Review of Systems  {Labs  Heme  Chem  Endocrine  Serology  Results Review (optional):23779}   Objective    There were no vitals taken for this visit. {Show previous vital signs (optional):23777}  Physical Exam  ***  No results found for any visits on 03/10/22.  Assessment & Plan     ***  No follow-ups on file.      {provider attestation***:1}   Jacky Kindle, FNP  [place] [phone] (phone) [phone] (fax)  [place]

## 2022-03-06 ENCOUNTER — Other Ambulatory Visit: Payer: Self-pay | Admitting: Family Medicine

## 2022-03-06 DIAGNOSIS — E1165 Type 2 diabetes mellitus with hyperglycemia: Secondary | ICD-10-CM

## 2022-03-08 ENCOUNTER — Ambulatory Visit: Payer: BC Managed Care – PPO | Admitting: Family Medicine

## 2022-03-10 ENCOUNTER — Ambulatory Visit: Payer: BC Managed Care – PPO | Admitting: Family Medicine

## 2022-03-10 ENCOUNTER — Encounter: Payer: Self-pay | Admitting: Family Medicine

## 2022-03-10 ENCOUNTER — Ambulatory Visit (INDEPENDENT_AMBULATORY_CARE_PROVIDER_SITE_OTHER): Payer: BC Managed Care – PPO | Admitting: Family Medicine

## 2022-03-10 VITALS — BP 129/85 | HR 94 | Temp 97.7°F | Resp 16 | Ht 68.0 in | Wt 220.0 lb

## 2022-03-10 DIAGNOSIS — E1165 Type 2 diabetes mellitus with hyperglycemia: Secondary | ICD-10-CM

## 2022-03-10 DIAGNOSIS — C189 Malignant neoplasm of colon, unspecified: Secondary | ICD-10-CM

## 2022-03-10 DIAGNOSIS — E1159 Type 2 diabetes mellitus with other circulatory complications: Secondary | ICD-10-CM

## 2022-03-10 DIAGNOSIS — F5104 Psychophysiologic insomnia: Secondary | ICD-10-CM

## 2022-03-10 DIAGNOSIS — I152 Hypertension secondary to endocrine disorders: Secondary | ICD-10-CM

## 2022-03-10 DIAGNOSIS — G4700 Insomnia, unspecified: Secondary | ICD-10-CM | POA: Insufficient documentation

## 2022-03-10 DIAGNOSIS — H4922 Sixth [abducent] nerve palsy, left eye: Secondary | ICD-10-CM | POA: Diagnosis not present

## 2022-03-10 LAB — POCT GLYCOSYLATED HEMOGLOBIN (HGB A1C)
Est. average glucose Bld gHb Est-mCnc: 137
Hemoglobin A1C: 6.5 % — AB (ref 4.0–5.6)

## 2022-03-10 MED ORDER — OZEMPIC (1 MG/DOSE) 4 MG/3ML ~~LOC~~ SOPN: 1.0000 mg | PEN_INJECTOR | SUBCUTANEOUS | 1 refills | Status: DC

## 2022-03-10 MED ORDER — ZOLPIDEM TARTRATE 10 MG PO TABS: 10.0000 mg | ORAL_TABLET | Freq: Every evening | ORAL | 5 refills | Status: DC | PRN

## 2022-03-10 NOTE — Assessment & Plan Note (Signed)
Chronic, improved Continue ozempic 1 mg/week; refills sent Recommend A1c q 3 months Also on XR Metformin 750 QD On ACEi/on Statin; will call for eye receipts Reports improvement noted in hyperglycemia; however, continues to have occ candy d/t stress at work

## 2022-03-10 NOTE — Assessment & Plan Note (Signed)
Chronic, improved Goal <130/<80 Continue to recommend diet and exercise to assist Continue lisinopril-hctz

## 2022-03-10 NOTE — Assessment & Plan Note (Signed)
Acute on chronic, worse with stress at work Restart Ambien 10 mg qHS PRN

## 2022-03-10 NOTE — Assessment & Plan Note (Signed)
Due for repeat colon cancer screening; patient declines referral back to GI to get colonoscopy

## 2022-03-10 NOTE — Assessment & Plan Note (Signed)
Previously on Ambien IR 10 mg; last refilled in April per PDMP; request restart

## 2022-03-15 DIAGNOSIS — L4050 Arthropathic psoriasis, unspecified: Secondary | ICD-10-CM | POA: Diagnosis not present

## 2022-03-15 DIAGNOSIS — L409 Psoriasis, unspecified: Secondary | ICD-10-CM | POA: Diagnosis not present

## 2022-03-15 DIAGNOSIS — Z79899 Other long term (current) drug therapy: Secondary | ICD-10-CM | POA: Diagnosis not present

## 2022-03-17 ENCOUNTER — Emergency Department
Admission: EM | Admit: 2022-03-17 | Discharge: 2022-03-17 | Disposition: A | Discharge status: Home / Self Care | Payer: BC Managed Care – PPO | Attending: Emergency Medicine | Admitting: Emergency Medicine

## 2022-03-17 ENCOUNTER — Other Ambulatory Visit: Payer: Self-pay | Admitting: Ophthalmology

## 2022-03-17 ENCOUNTER — Other Ambulatory Visit: Payer: Self-pay

## 2022-03-17 ENCOUNTER — Emergency Department: Payer: BC Managed Care – PPO

## 2022-03-17 DIAGNOSIS — I10 Essential (primary) hypertension: Secondary | ICD-10-CM | POA: Diagnosis not present

## 2022-03-17 DIAGNOSIS — H4902 Third [oculomotor] nerve palsy, left eye: Secondary | ICD-10-CM | POA: Diagnosis not present

## 2022-03-17 DIAGNOSIS — H532 Diplopia: Secondary | ICD-10-CM | POA: Diagnosis not present

## 2022-03-17 DIAGNOSIS — H4922 Sixth [abducent] nerve palsy, left eye: Secondary | ICD-10-CM

## 2022-03-17 DIAGNOSIS — I639 Cerebral infarction, unspecified: Secondary | ICD-10-CM | POA: Diagnosis not present

## 2022-03-17 DIAGNOSIS — G988 Other disorders of nervous system: Secondary | ICD-10-CM | POA: Diagnosis not present

## 2022-03-17 DIAGNOSIS — E119 Type 2 diabetes mellitus without complications: Secondary | ICD-10-CM | POA: Insufficient documentation

## 2022-03-17 DIAGNOSIS — G510 Bell's palsy: Secondary | ICD-10-CM | POA: Diagnosis not present

## 2022-03-17 DIAGNOSIS — H4930 Total (external) ophthalmoplegia, unspecified eye: Secondary | ICD-10-CM | POA: Diagnosis not present

## 2022-03-17 LAB — COMPREHENSIVE METABOLIC PANEL
ALT: 15 U/L (ref 0–44)
AST: 19 U/L (ref 15–41)
Albumin: 4.2 g/dL (ref 3.5–5.0)
Alkaline Phosphatase: 44 U/L (ref 38–126)
Anion gap: 7 (ref 5–15)
BUN: 19 mg/dL (ref 6–20)
CO2: 25 mmol/L (ref 22–32)
Calcium: 9.5 mg/dL (ref 8.9–10.3)
Chloride: 105 mmol/L (ref 98–111)
Creatinine, Ser: 0.87 mg/dL (ref 0.61–1.24)
GFR, Estimated: >60 mL/min (ref >60)
Glucose, Bld: 175 mg/dL — ABNORMAL HIGH (ref 70–99)
Potassium: 3.6 mmol/L (ref 3.5–5.1)
Sodium: 137 mmol/L (ref 135–145)
Total Bilirubin: 2 mg/dL — ABNORMAL HIGH (ref 0.3–1.2)
Total Protein: 8 g/dL (ref 6.5–8.1)

## 2022-03-17 LAB — CBC WITH DIFFERENTIAL/PLATELET
Abs Immature Granulocytes: 0.02 10*3/uL (ref 0.00–0.07)
Basophils Absolute: 0.1 10*3/uL (ref 0.0–0.1)
Basophils Relative: 1 %
Eosinophils Absolute: 0.1 10*3/uL (ref 0.0–0.5)
Eosinophils Relative: 2 %
HCT: 42.1 % (ref 39.0–52.0)
Hemoglobin: 14.4 g/dL (ref 13.0–17.0)
Immature Granulocytes: 0 %
Lymphocytes Relative: 37 %
Lymphs Abs: 3 10*3/uL (ref 0.7–4.0)
MCH: 31.4 pg (ref 26.0–34.0)
MCHC: 34.2 g/dL (ref 30.0–36.0)
MCV: 91.7 fL (ref 80.0–100.0)
Monocytes Absolute: 0.8 10*3/uL (ref 0.1–1.0)
Monocytes Relative: 10 %
Neutro Abs: 4.2 10*3/uL (ref 1.7–7.7)
Neutrophils Relative %: 50 %
Platelets: 303 10*3/uL (ref 150–400)
RBC: 4.59 MIL/uL (ref 4.22–5.81)
RDW: 12.3 % (ref 11.5–15.5)
WBC: 8.2 10*3/uL (ref 4.0–10.5)
nRBC: 0 % (ref 0.0–0.2)

## 2022-03-17 LAB — TROPONIN I (HIGH SENSITIVITY)
Troponin I (High Sensitivity): 5 ng/L (ref <18)
Troponin I (High Sensitivity): 5 ng/L (ref <18)

## 2022-03-17 LAB — URINALYSIS, ROUTINE W REFLEX MICROSCOPIC
Bilirubin Urine: NEGATIVE
Glucose, UA: 150 mg/dL — AB
Hgb urine dipstick: NEGATIVE
Ketones, ur: NEGATIVE mg/dL
Leukocytes,Ua: NEGATIVE
Nitrite: NEGATIVE
Protein, ur: NEGATIVE mg/dL
Specific Gravity, Urine: 1.026 (ref 1.005–1.030)
pH: 5 (ref 5.0–8.0)

## 2022-03-17 NOTE — ED Notes (Signed)
Discharge instructions discussed with pt. Pt verbalized understanding with  no additional questions. Pt to go home with ex fiance.

## 2022-03-17 NOTE — ED Triage Notes (Signed)
Pt was seen at his eye doctor today for double vision that started yesterday. Pt was sent here by the eye DR.

## 2022-03-17 NOTE — ED Provider Triage Note (Signed)
Emergency Medicine Provider Triage Evaluation Note  [name] , a 61 y.o. male  was evaluated in triage.  Pt complains of double vision since yesterday. Sent from [facility] for concern of CVA with history of the same.  Physical Exam  There were no vitals taken for this visit. Gen:   Awake, no distress   Resp:  Normal effort  MSK:   Moves extremities without difficulty  Other:  No focal facial or extremity neuro deficits.  Medical Decision Making  Medically screening exam initiated at 4:03 PM.  Appropriate orders placed.  [name] was informed that the remainder of the evaluation will be completed by another provider, this initial triage assessment does not replace that evaluation, and the importance of remaining in the ED until their evaluation is complete.  CVA protocol ordered. Out of window to activate code stroke.   Victorino Dike, FNP 03/17/22 1939

## 2022-03-17 NOTE — ED Provider Notes (Signed)
[facility] Provider Note    Event Date/Time   First MD Initiated Contact with Patient 03/17/22 1839     (approximate)   History   Neurologic Problem   HPI {Remember to add pertinent medical, surgical, social, and/or OB history to HPI:1} [name] is a 61 y.o. male  ***       Physical Exam   Triage Vital Signs: ED Triage Vitals  Enc Vitals Group     BP 03/17/22 1607 136/82     Pulse Rate 03/17/22 1607 97     Resp 03/17/22 1607 17     Temp 03/17/22 1607 98 F (36.7 C)     Temp Source 03/17/22 1607 Oral     SpO2 03/17/22 1607 98 %     Weight 03/17/22 1608 219 lb 12.8 oz (99.7 kg)     Height 03/17/22 1608 '5\' 8"'$  (1.727 m)     Head Circumference --      Peak Flow --      Pain Score 03/17/22 1608 0     Pain Loc --      Pain Edu? --      Excl. in [place]? --     Most recent vital signs: Vitals:   03/17/22 1607 03/17/22 1906  BP: 136/82 130/78  Pulse: 97 90  Resp: 17 17  Temp: 98 F (36.7 C)   SpO2: 98% 98%    {Only need to document appropriate and relevant physical exam:1} General: Awake, no distress. *** CV:  Good peripheral perfusion. *** Resp:  Normal effort. *** Abd:  No distention. *** Other:  ***   ED Results / Procedures / Treatments   Labs (all labs ordered are listed, but only abnormal results are displayed) Labs Reviewed  COMPREHENSIVE METABOLIC PANEL - Abnormal; Notable for the following components:      Result Value   Glucose, Bld 175 (*)    Total Bilirubin 2.0 (*)    All other components within normal limits  URINALYSIS, ROUTINE W REFLEX MICROSCOPIC - Abnormal; Notable for the following components:   Color, Urine YELLOW (*)    APPearance CLEAR (*)    Glucose, UA 150 (*)    All other components within normal limits  CBC WITH DIFFERENTIAL/PLATELET  TROPONIN I (HIGH SENSITIVITY)  TROPONIN I (HIGH SENSITIVITY)     EKG  ***   RADIOLOGY *** {USE THE WORD "INTERPRETED"!! You MUST document your own  interpretation of imaging, as well as the fact that you reviewed the radiologist's report!:1}   PROCEDURES:  Critical Care performed: {CriticalCareYesNo:19197::"Yes, see critical care procedure note(s)","No"}  Procedures   MEDICATIONS ORDERED IN ED: Medications - No data to display   IMPRESSION / MDM / Cloud / ED COURSE  I reviewed the triage vital signs and the nursing notes.                              Differential diagnosis includes, but is not limited to, ***  Patient's presentation is most consistent with {EM COPA:27473}  {If the patient is on the monitor, remove the brackets and asterisks on the sentence below and remember to document it as a Procedure as well. Otherwise delete the sentence below:1} {**The patient is on the cardiac monitor to evaluate for evidence of arrhythmia and/or significant heart rate changes.**} {Remember to include, when applicable, any/all of the following data: independent review of imaging independent review of labs (comment specifically  on pertinent positives and negatives) review of specific prior hospitalizations, PCP/specialist notes, etc. discuss meds given and prescribed document any discussion with consultants (including hospitalists) any clinical decision tools you used and why (PECARN, NEXUS, etc.) did you consider admitting the patient? document social determinants of health affecting patient's care (homelessness, inability to follow up in a timely fashion, etc) document any pre-existing conditions increasing risk on current visit (e.g. diabetes and HTN increasing danger of high-risk chest pain/ACS) describes what meds you gave (especially parenteral) and why any other interventions?:1} Clinical Course as of 03/17/22 2109  Wed Mar 17, 2022  1842 New 6th nerve palsy and diplopia for 1 day per Dr. Edgar Frisk. Sent for MRI to evalute for stroke per Dr. George Ina, if MRI negative for acute suspect microvascular cause/palsey.   [MQ]    Clinical Course User Index [MQ] Delman Kitten, MD     FINAL CLINICAL IMPRESSION(S) / ED DIAGNOSES   Final diagnoses:  Lateral rectus palsy, left     Rx / DC Orders   ED Discharge Orders     None        Note:  This document was prepared using Dragon voice recognition software and may include unintentional dictation errors.

## 2022-03-17 NOTE — Discharge Instructions (Signed)
Your MRI did not show a new stroke.  It appears you have a nerve palsy that is causing difficulty with your left eye looking to the side.  Please do not drive.  Continue your current medications.  Follow-up closely with ophthalmology and your primary care doctor  Return to the ER right away if you have slurred speech, worsening symptoms, weakness, headache, visual changes, eye pain or other new concerns arise.

## 2022-03-18 ENCOUNTER — Ambulatory Visit: Admission: RE | Admit: 2022-03-18 | Payer: BC Managed Care – PPO | Source: Ambulatory Visit

## 2022-03-18 ENCOUNTER — Telehealth: Payer: Self-pay | Admitting: Family Medicine

## 2022-03-18 NOTE — Telephone Encounter (Signed)
Patient came into clinic to speak with [name]. She was between patients so I spoke with him and he stated he wanted to know why he had to do a f/u visit with PCP like the ED recommended. Was not happy about it and stated he did not want to want to waste his time coming in here if no one will help him. Also was asking for an excuse note for work via phone calls, but did not ask in person. Was seen at ED and [facility] in the past week but was not happy with any provider's recommendations.

## 2022-03-18 NOTE — Progress Notes (Signed)
Carelink Summary Report / Loop Recorder 

## 2022-03-18 NOTE — Telephone Encounter (Signed)
Pt is calling to ask can he be written out of work based on his appt at the ED. Pt has a hospital follow up appt. Pt will cancel the appt if he not able to be written out of work. Please advise [phone]

## 2022-03-19 ENCOUNTER — Ambulatory Visit (INDEPENDENT_AMBULATORY_CARE_PROVIDER_SITE_OTHER): Payer: BC Managed Care – PPO

## 2022-03-19 DIAGNOSIS — I255 Ischemic cardiomyopathy: Secondary | ICD-10-CM | POA: Diagnosis not present

## 2022-03-22 LAB — CUP PACEART REMOTE DEVICE CHECK
Date Time Interrogation Session: 20230830230637
Date Time Interrogation Session: 20230902000132
Implantable Pulse Generator Implant Date: 20220405
Implantable Pulse Generator Implant Date: 20220405

## 2022-03-24 ENCOUNTER — Inpatient Hospital Stay: Payer: BC Managed Care – PPO | Admitting: Family Medicine

## 2022-03-24 ENCOUNTER — Telehealth: Payer: Self-pay

## 2022-03-24 NOTE — Telephone Encounter (Signed)
Copied from [place] [phone]. Topic: General - Other >> Mar 24, 2022 11:10 AM Leilani Able wrote: Reason for CRM: PT has called in re to an appt with Daneil Dan in re to short term disability and states it is an emergency, not for the office but him as this needs to be done. Pt did not request a call back as states notes are no good at office. [phone]

## 2022-03-24 NOTE — Telephone Encounter (Signed)
Patient has an appt Sept 18th with [name]. He had one today that he canceled.

## 2022-03-25 ENCOUNTER — Ambulatory Visit: Payer: BC Managed Care – PPO | Admitting: Dermatology

## 2022-03-31 DIAGNOSIS — H4922 Sixth [abducent] nerve palsy, left eye: Secondary | ICD-10-CM | POA: Diagnosis not present

## 2022-03-31 DIAGNOSIS — E119 Type 2 diabetes mellitus without complications: Secondary | ICD-10-CM | POA: Diagnosis not present

## 2022-03-31 DIAGNOSIS — Z794 Long term (current) use of insulin: Secondary | ICD-10-CM | POA: Diagnosis not present

## 2022-04-02 NOTE — Progress Notes (Unsigned)
Established patient visit   Patient: [name]   DOB: [birth year]   61 y.o. Male  MRN: [id number] Visit Date: 04/05/2022  Today's healthcare provider: Gwyneth Sprout, FNP re-introduced to nurse practitioner role and practice setting.  All questions answered.  Discussed provider/patient relationship and expectations.   I,[name],acting as a scribe for Gwyneth Sprout, FNP.,have documented all relevant documentation on the behalf of Gwyneth Sprout, FNP,as directed by  Gwyneth Sprout, FNP while in the presence of Gwyneth Sprout, FNP.   Chief Complaint  Patient presents with   Form Completion   Visual Field Change    Patient complains of double vision starting 3 weeks ago. Wants short term disability forms filled out.    Subjective    HPI HPI     Visual Field Change    Additional comments: Patient complains of double vision starting 3 weeks ago. Wants short term disability forms filled out.       Last edited by Smitty Knudsen, CMA on 04/05/2022  8:09 AM.       Medications: Outpatient Medications Prior to Visit  Medication Sig   aspirin EC 81 MG tablet Take 1 tablet (81 mg total) by mouth daily.   Blood Glucose Monitoring Suppl (CONTOUR NEXT EZ) w/Device KIT See admin instructions.   Continuous Blood Gluc Receiver (FREESTYLE LIBRE 14 DAY READER) DEVI 1 each by Does not apply route every 14 (fourteen) days.   Continuous Blood Gluc Sensor (FREESTYLE LIBRE 3 SENSOR) MISC 1 each by Does not apply route every 14 (fourteen) days. Place 1 sensor on the skin every 14 days. Use to check glucose continuously   diazepam (VALIUM) 5 MG tablet Take 1 tablet (5 mg total) by mouth every 12 (twelve) hours.   ezetimibe (ZETIA) 10 MG tablet Take 1 tablet (10 mg total) by mouth once daily.   glucose blood (CONTOUR NEXT TEST) test strip Use as directed to check blood sugar 3 times daily.   Insulin Glargine (BASAGLAR KWIKPEN) 100 UNIT/ML Inject 60 Units into the skin at bedtime. Titrate  to Fasting Blood Sugar of 100- 150 mg/dL with morning blood sugar check.   Insulin Pen Needle 32G X 8 MM MISC 1 each by Does not apply route at bedtime.   lisinopril-hydrochlorothiazide (ZESTORETIC) 20-25 MG tablet Take 1 tablet by mouth daily.   melatonin 5 MG TABS Take 1 tablet (5 mg total) by mouth at bedtime.   metFORMIN (GLUCOPHAGE-XR) 750 MG 24 hr tablet Take 1 tablet (750 mg total) by mouth daily with breakfast.   methocarbamol (ROBAXIN) 500 MG tablet Take 1 tablet (500 mg total) by mouth 4 (four) times daily.   metoprolol succinate (TOPROL XL) 25 MG 24 hr tablet Take 1 tablet (25 mg total) by mouth once daily.   Multiple Vitamins-Minerals (MULTIVITAMIN ADULT, MINERALS,) TABS Take 1 tablet by mouth daily.   potassium chloride (KLOR-CON) 10 MEQ tablet Take 1 tablet (10 mEq total) by mouth daily.   rosuvastatin (CRESTOR) 40 MG tablet Take 1 tablet (40 mg total) by mouth every evening.   Semaglutide, 1 MG/DOSE, (OZEMPIC, 1 MG/DOSE,) 4 MG/3ML SOPN Inject 1 mg as directed once a week.   ticagrelor (BRILINTA) 60 MG TABS tablet Take 1 tablet (60 mg total) by mouth 2 (two) times daily.   ticagrelor (BRILINTA) 60 MG TABS tablet Take 1 tablet (60 mg total) by mouth 2 (two) times daily.   Vitamin D, Ergocalciferol, (DRISDOL) 1.25 MG (50000 UNIT)  CAPS capsule Take 1 capsule (50,000 Units total) by mouth once every 7 days.   zolpidem (AMBIEN) 10 MG tablet Take 1 tablet (10 mg total) by mouth at bedtime as needed.   lidocaine (LIDODERM) 5 % Place 1 patch onto the skin every 12 (twelve) hours. Remove & Discard patch within 12 hours or as directed by MD   meloxicam (MOBIC) 15 MG tablet Take 1 tablet (15 mg total) by mouth daily.   No facility-administered medications prior to visit.    Review of Systems    Objective    BP 98/65 (BP Location: Right Arm, Patient Position: Sitting, Cuff Size: Large)   Pulse 100   Temp 97.8 F (36.6 C) (Oral)   Resp 16   Ht '5\' 8"'  (1.727 m)   Wt 209 lb (94.8 kg)    SpO2 100%   BMI 31.78 kg/m   Physical Exam Vitals and nursing note reviewed.  Constitutional:      Appearance: Normal appearance. He is obese.  HENT:     Head: Normocephalic and atraumatic.  Eyes:     Pupils: Pupils are equal, round, and reactive to light.     Comments: R eye covered with a sock behind patient's eyeglasses d/t double vision   Cardiovascular:     Rate and Rhythm: Normal rate and regular rhythm.     Pulses: Normal pulses.     Heart sounds: Normal heart sounds.  Pulmonary:     Effort: Pulmonary effort is normal.     Breath sounds: Normal breath sounds.  Musculoskeletal:        General: Normal range of motion.     Cervical back: Normal range of motion.  Skin:    General: Skin is warm and dry.     Capillary Refill: Capillary refill takes less than 2 seconds.  Neurological:     General: No focal deficit present.     Mental Status: He is alert and oriented to person, place, and time. Mental status is at baseline.      No results found for any visits on 04/05/22.  Assessment & Plan     Problem List Items Addressed This Visit       Endocrine   Diabetes mellitus ([place])    Chronic, improved; continue to monitor home blood sugars Weight loss noted Denies lows Request for formal DM education      Relevant Orders   Referral to Nutrition and Diabetes Services   Hyperlipidemia associated with type 2 diabetes mellitus ([place])    Chronic, previous elevated Request for FLP today with other labs Hx of previous MI and CVA      Relevant Orders   Lipid panel     Nervous and Auditory   6th nerve palsy, left - Primary    Acute x 3 weeks; has sought consult from ED s/p MRI and 2 eye doctors Here today for disability paperwork for intermittent leave given unable to perform job requirements 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      Vonna Kotyk, FNP, have reviewed all documentation for this visit. The documentation on 04/05/22 for the exam, diagnosis, procedures, and orders are all accurate and complete.    Gwyneth Sprout, [place] [phone] (phone) [phone] (fax)  [place]

## 2022-04-05 ENCOUNTER — Ambulatory Visit (INDEPENDENT_AMBULATORY_CARE_PROVIDER_SITE_OTHER): Payer: BC Managed Care – PPO | Admitting: Family Medicine

## 2022-04-05 ENCOUNTER — Other Ambulatory Visit (HOSPITAL_BASED_OUTPATIENT_CLINIC_OR_DEPARTMENT_OTHER): Payer: Self-pay | Admitting: Family

## 2022-04-05 ENCOUNTER — Encounter: Payer: Self-pay | Admitting: Family Medicine

## 2022-04-05 VITALS — BP 98/65 | HR 100 | Temp 97.8°F | Resp 16 | Ht 68.0 in | Wt 209.0 lb

## 2022-04-05 DIAGNOSIS — D508 Other iron deficiency anemias: Secondary | ICD-10-CM

## 2022-04-05 DIAGNOSIS — E1165 Type 2 diabetes mellitus with hyperglycemia: Secondary | ICD-10-CM | POA: Diagnosis not present

## 2022-04-05 DIAGNOSIS — E785 Hyperlipidemia, unspecified: Secondary | ICD-10-CM

## 2022-04-05 DIAGNOSIS — E1169 Type 2 diabetes mellitus with other specified complication: Secondary | ICD-10-CM | POA: Diagnosis not present

## 2022-04-05 DIAGNOSIS — H4922 Sixth [abducent] nerve palsy, left eye: Secondary | ICD-10-CM | POA: Insufficient documentation

## 2022-04-05 NOTE — Assessment & Plan Note (Signed)
Chronic, previous elevated Request for FLP today with other labs Hx of previous MI and CVA

## 2022-04-05 NOTE — Assessment & Plan Note (Signed)
Concern for worsening anemia with associated palsy of nerve Request for vitamin levels and iron levels be checked per specialist visit

## 2022-04-05 NOTE — Assessment & Plan Note (Signed)
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

## 2022-04-05 NOTE — Telephone Encounter (Signed)
Pt of Dr. Fletcher Anon. Last seen by Laurann Montana, NP 01/2021. Please call to schedule overdue appointment for refills. Thank you!

## 2022-04-05 NOTE — Assessment & Plan Note (Signed)
Chronic, improved; continue to monitor home blood sugars Weight loss noted Denies lows Request for formal DM education

## 2022-04-07 LAB — CBC WITH DIFFERENTIAL/PLATELET
Basophils Absolute: 0.1 10*3/uL (ref 0.0–0.2)
Basos: 1 %
EOS (ABSOLUTE): 0.2 10*3/uL (ref 0.0–0.4)
Eos: 1 %
Hematocrit: 42.9 % (ref 37.5–51.0)
Hemoglobin: 14.9 g/dL (ref 13.0–17.7)
Immature Grans (Abs): 0 10*3/uL (ref 0.0–0.1)
Immature Granulocytes: 0 %
Lymphocytes Absolute: 2.8 10*3/uL (ref 0.7–3.1)
Lymphs: 22 %
MCH: 32.1 pg (ref 26.6–33.0)
MCHC: 34.7 g/dL (ref 31.5–35.7)
MCV: 93 fL (ref 79–97)
Monocytes Absolute: 0.9 10*3/uL (ref 0.1–0.9)
Monocytes: 7 %
Neutrophils Absolute: 8.4 10*3/uL — ABNORMAL HIGH (ref 1.4–7.0)
Neutrophils: 69 %
Platelets: 308 10*3/uL (ref 150–450)
RBC: 4.64 x10E6/uL (ref 4.14–5.80)
RDW: 12.5 % (ref 11.6–15.4)
WBC: 12.3 10*3/uL — ABNORMAL HIGH (ref 3.4–10.8)

## 2022-04-07 LAB — B12 AND FOLATE PANEL
Folate: 14.3 ng/mL (ref >3.0)
Vitamin B-12: 358 pg/mL (ref 232–1245)

## 2022-04-07 LAB — VITAMIN D 25 HYDROXY (VIT D DEFICIENCY, FRACTURES): Vit D, 25-Hydroxy: 42.1 ng/mL (ref 30.0–100.0)

## 2022-04-07 LAB — IRON,TIBC AND FERRITIN PANEL
Ferritin: 206 ng/mL (ref 30–400)
Iron Saturation: 21 % (ref 15–55)
Iron: 79 ug/dL (ref 38–169)
Total Iron Binding Capacity: 373 ug/dL (ref 250–450)
UIBC: 294 ug/dL (ref 111–343)

## 2022-04-07 LAB — LIPID PANEL
Chol/HDL Ratio: 1.9 ratio (ref 0.0–5.0)
Cholesterol, Total: 73 mg/dL — ABNORMAL LOW (ref 100–199)
HDL: 38 mg/dL — ABNORMAL LOW (ref >39)
LDL Chol Calc (NIH): 12 mg/dL (ref 0–99)
Triglycerides: 134 mg/dL (ref 0–149)
VLDL Cholesterol Cal: 23 mg/dL (ref 5–40)

## 2022-04-07 LAB — VITAMIN B6: Vitamin B6: 14 ug/L (ref 3.4–65.2)

## 2022-04-07 NOTE — Progress Notes (Signed)
Cholesterol is quite low; OK to switch to zetia/crestor every other day to assist with recovery.  All other labs are normal and stable.  Gwyneth Sprout, [address] [place], [place] [address] [phone] (phone) [phone] (fax) [place]

## 2022-04-08 NOTE — Progress Notes (Signed)
Carelink Summary Report / Loop Recorder 

## 2022-04-09 ENCOUNTER — Ambulatory Visit: Payer: BC Managed Care – PPO | Attending: Cardiovascular Disease | Admitting: Cardiovascular Disease

## 2022-04-09 ENCOUNTER — Encounter: Payer: Self-pay | Admitting: Cardiovascular Disease

## 2022-04-09 VITALS — BP 100/60 | HR 95 | Ht 68.0 in | Wt 207.5 lb

## 2022-04-09 DIAGNOSIS — I5022 Chronic systolic (congestive) heart failure: Secondary | ICD-10-CM | POA: Diagnosis not present

## 2022-04-09 DIAGNOSIS — E1169 Type 2 diabetes mellitus with other specified complication: Secondary | ICD-10-CM

## 2022-04-09 DIAGNOSIS — I10 Essential (primary) hypertension: Secondary | ICD-10-CM | POA: Diagnosis not present

## 2022-04-09 DIAGNOSIS — E785 Hyperlipidemia, unspecified: Secondary | ICD-10-CM | POA: Diagnosis not present

## 2022-04-09 DIAGNOSIS — I2510 Atherosclerotic heart disease of native coronary artery without angina pectoris: Secondary | ICD-10-CM

## 2022-04-09 MED ORDER — LISINOPRIL 20 MG PO TABS: 20.0000 mg | ORAL_TABLET | Freq: Every day | ORAL | 3 refills | Status: DC

## 2022-04-09 MED ORDER — METOPROLOL SUCCINATE ER 25 MG PO TB24: 25.0000 mg | ORAL_TABLET | Freq: Every day | ORAL | 3 refills | Status: DC

## 2022-04-09 MED ORDER — ROSUVASTATIN CALCIUM 20 MG PO TABS: 20.0000 mg | ORAL_TABLET | Freq: Every evening | ORAL | 3 refills | Status: DC

## 2022-04-09 NOTE — Progress Notes (Signed)
Cardiology Office Note   Date:  04/09/2022   ID:  [name], DOB September 07, 1960, MRN [id number]  PCP:  Gwyneth Sprout, FNP  Cardiologist:   Kathlyn Sacramento, MD   Chief Complaint  Patient presents with   Other    OD f/u. Meds reviewed verbally with pt.      History of Present Illness: [name] is a 61 y.o. male who presents for for follow-up visit regarding coronary artery disease and chronic systolic heart failure due to ischemic cardiomyopathy.  He has known history of diabetes mellitus, essential hypertension, CVA ,hyperlipidemia and tobacco use. He presented in April of 2021 with anterior ST elevation myocardial infarction.  I proceeded with emergent cardiac catheterization which showed an occluded proximal LAD.  This was treated successfully with PCI and drug-eluting stent placement.  There was residual nonocclusive thrombus in the first diagonal which was treated with Aggrastat infusion for 18 hours.  Echocardiogram showed an EF of 35 to 40%.  He did not tolerate prasugrel and was switched to clopidogrel with subsequent improvement in symptoms  He quit smoking since his cardiac event.  During last visit he was switched from carvedilol to Toprol due to persistent tachycardia and shortness of breath.  He had myalgia with atorvastatin but improved after switching to rosuvastatin.  He had compliance issues.  He was hospitalized in April 2022 with acute CVA in the setting of noncompliance with antihypertensive medications.  Imaging showed left posterior thalamic punctuate infarct.  He had a loop recorder inserted.  Transesophageal echocardiogram showed no cardiac source of embolism.  EF was 55 to 60%.  He has been doing reasonably well overall.  He reports episodes of palpitations at night when he tries to sleep.  He ran out of Toprol.  No true chest pain or shortness of breath.  Past Medical History:  Diagnosis Date   Anemia 10/03/2009   Qualifier: Diagnosis of  By: [name]    Anxiety associated with depression 10/11/2013   Colon cancer [facility])    right colon cancer- adenocarcinoma CEA level isnrmal at 1.8   Diabetes mellitus    Diabetes mellitus type 2 in obese ([place]) 03/20/2010   Qualifier: Diagnosis of  By: [name]     Erectile dysfunction 06/22/2016   Essential hypertension 06/05/2014   FATTY LIVER DISEASE 11/04/2009   Qualifier: Diagnosis of  By: [name] MD, Docia Chuck   Qualifier: Diagnosis of  By: [name] MD, Docia Chuck  Last Assessment & Plan:  conirmed by CT scan of abdomen in April 2016 encouraged to minimize simple carbs and fatty foods.   GERD 11/04/2009   Qualifier: Diagnosis of  By: [name] MD, Docia Chuck    Great toe pain, right 04/18/2017   Hepatic artery stenosis [facility])    History of colon cancer 01/02/2010   Qualifier: Diagnosis of  By: [name] Dr [name]  Last Assessment & Plan:  Follows closely with gastroenterology, Dr [name]   Hyperlipidemia    Hyperlipidemia, mixed 09/07/2010   Qualifier: Diagnosis of  By: [name]    Hypertension    Hypertriglyceridemia 12/14/2010   Hypotestosteronism 06/22/2016   INSOMNIA, CHRONIC 03/20/2010   Qualifier: Diagnosis of  By: [name] Ambien 10 mg daily does not keep asleep AdvilPM is over sedating and has trouble waking up    Iron deficiency anemia 2011   Low testosterone 06/22/2016   Malignant neoplasm of colon ([place]) 01/02/2010  Qualifier: Diagnosis of  By: [name] Dr [name]    Muscle spasm of back 06/05/2014   Nipple pain 09/30/2016   Obesity    Palpitations 06/05/2014   Preventative health care 10/08/2012   Psoriasis    Psoriatic arthritis ([place]) 08/25/2017   Right knee pain 11/17/2011   Stroke [facility])     Past Surgical History:  Procedure Laterality Date   BUBBLE STUDY  10/21/2020   Procedure: BUBBLE STUDY;  Surgeon: [name], MD;  Location: [place];  Service: Cardiovascular;;   CARPAL TUNNEL RELEASE     RIGHT   CHOLECYSTECTOMY  1994    COLONOSCOPY     CORONARY/GRAFT ACUTE MI REVASCULARIZATION N/A 10/27/2019   Procedure: Coronary/Graft Acute MI Revascularization;  Surgeon: [name], MD;  Location: [place] CV LAB;  Service: Cardiovascular;  Laterality: N/A;   HEMICOLECTOMY     12/17/2009 right   LEFT HEART CATH AND CORONARY ANGIOGRAPHY N/A 10/27/2019   Procedure: LEFT HEART CATH AND CORONARY ANGIOGRAPHY;  Surgeon: [name], MD;  Location: [place] CV LAB;  Service: Cardiovascular;  Laterality: N/A;   LOOP RECORDER INSERTION N/A 10/21/2020   Procedure: LOOP RECORDER INSERTION;  Surgeon: [name], MD;  Location: [place] CV LAB;  Service: Cardiovascular;  Laterality: N/A;   POLYPECTOMY     TEE WITHOUT CARDIOVERSION N/A 10/21/2020   Procedure: TRANSESOPHAGEAL ECHOCARDIOGRAM (TEE);  Surgeon: [name], MD;  Location: [facility] ENDOSCOPY;  Service: Cardiovascular;  Laterality: N/A;     Current Outpatient Medications  Medication Sig Dispense Refill   Blood Glucose Monitoring Suppl (CONTOUR NEXT EZ) w/Device KIT See admin instructions.     BRILINTA 60 MG TABS tablet TAKE 1 TABLET BY MOUTH TWICE DAILY 30 tablet 0   Continuous Blood Gluc Receiver (FREESTYLE LIBRE 14 DAY READER) DEVI 1 each by Does not apply route every 14 (fourteen) days. 2 each 11   Continuous Blood Gluc Sensor (FREESTYLE LIBRE 3 SENSOR) MISC 1 each by Does not apply route every 14 (fourteen) days. Place 1 sensor on the skin every 14 days. Use to check glucose continuously 2 each 11   diazepam (VALIUM) 5 MG tablet Take 1 tablet (5 mg total) by mouth every 12 (twelve) hours. 60 tablet 0   ezetimibe (ZETIA) 10 MG tablet Take 1 tablet (10 mg total) by mouth once daily. 30 tablet 5   glucose blood (CONTOUR NEXT TEST) test strip Use as directed to check blood sugar 3 times daily. 300 each 1   Insulin Glargine (BASAGLAR KWIKPEN) 100 UNIT/ML Inject 60 Units into the skin at bedtime. Titrate to Fasting Blood Sugar of 100- 150 mg/dL with morning  blood sugar check. (Patient taking differently: Inject 10-15 Units into the skin at bedtime. Titrate to Fasting Blood Sugar of 100- 150 mg/dL with morning blood sugar check.) 60 mL 0   Insulin Pen Needle 32G X 8 MM MISC 1 each by Does not apply route at bedtime. 100 each 3   lisinopril-hydrochlorothiazide (ZESTORETIC) 20-25 MG tablet Take 1 tablet by mouth daily. (Patient taking differently: Take 1 tablet by mouth every other day.) 90 tablet 3   melatonin 5 MG TABS Take 1 tablet (5 mg total) by mouth at bedtime. 30 tablet 0   metFORMIN (GLUCOPHAGE-XR) 750 MG 24 hr tablet Take 1 tablet (750 mg total) by mouth daily with breakfast. 180 tablet 0   rosuvastatin (CRESTOR) 40 MG tablet Take 1 tablet (40 mg total) by mouth every evening. 90 tablet  1   Semaglutide, 1 MG/DOSE, (OZEMPIC, 1 MG/DOSE,) 4 MG/3ML SOPN Inject 1 mg as directed once a week. 9 mL 1   zolpidem (AMBIEN) 10 MG tablet Take 1 tablet (10 mg total) by mouth at bedtime as needed. 30 tablet 5   aspirin EC 81 MG tablet Take 1 tablet (81 mg total) by mouth daily. (Patient not taking: Reported on 04/09/2022) 90 tablet 3   methocarbamol (ROBAXIN) 500 MG tablet Take 1 tablet (500 mg total) by mouth 4 (four) times daily. (Patient not taking: Reported on 04/09/2022) 120 tablet 0   metoprolol succinate (TOPROL XL) 25 MG 24 hr tablet Take 1 tablet (25 mg total) by mouth once daily. (Patient not taking: Reported on 04/09/2022) 30 tablet 5   Multiple Vitamins-Minerals (MULTIVITAMIN ADULT, MINERALS,) TABS Take 1 tablet by mouth daily. (Patient not taking: Reported on 04/09/2022) 30 tablet 3   potassium chloride (KLOR-CON) 10 MEQ tablet Take 1 tablet (10 mEq total) by mouth daily. (Patient not taking: Reported on 04/09/2022) 7 tablet 0   ticagrelor (BRILINTA) 60 MG TABS tablet Take 1 tablet (60 mg total) by mouth 2 (two) times daily. (Patient not taking: Reported on 04/09/2022) 180 tablet 1   Vitamin D, Ergocalciferol, (DRISDOL) 1.25 MG (50000 UNIT) CAPS capsule  Take 1 capsule (50,000 Units total) by mouth once every 7 days. (Patient not taking: Reported on 04/09/2022) 4 capsule 4   No current facility-administered medications for this visit.    Allergies:   Clopidogrel    Social History:  The patient  reports that he quit smoking about 2 years ago. His smoking use included cigarettes. He has never used smokeless tobacco. He reports that he does not drink alcohol and does not use drugs.   Family History:  The patient's family history includes Alcohol abuse in his brother; Diabetes in his brother; Diabetes type II in his brother; Stomach cancer in his mother.    ROS:  Please see the history of present illness.   Otherwise, review of systems are positive for none.   All other systems are reviewed and negative.    PHYSICAL EXAM: VS:  BP 100/60 (BP Location: Left Arm, Patient Position: Sitting, Cuff Size: Normal)   Pulse 95   Ht _0  (1.727 m)   Wt 207 lb 8 oz (94.1 kg)   SpO2 96%   BMI 31.55 kg/m  , BMI Body mass index is 31.55 kg/m. GEN: Well nourished, well developed, in no acute distress  HEENT: normal  Neck: no JVD, carotid bruits, or masses Cardiac: RRR; no murmurs, rubs, or gallops,no edema  Respiratory:  clear to auscultation bilaterally, normal work of breathing GI: soft, nontender, nondistended, + BS MS: no deformity or atrophy  Skin: warm and dry, no rash Neuro:  Strength and sensation are intact Psych: euthymic mood, full affect    EKG:  EKG is ordered today. The ekg ordered today demonstrates normal sinus rhythm with old anteroseptal infarct.  Low voltage.   Recent Labs: 03/17/2022: ALT 15; BUN 19; Creatinine, Ser 0.87; Potassium 3.6; Sodium 137 04/05/2022: Hemoglobin 14.9; Platelets 308    Lipid Panel    Component Value Date/Time   CHOL 73 (L) 04/05/2022 0848   CHOL 949 (H) 10/27/2014 1322   TRIG 134 04/05/2022 0848   TRIG 1,098 (H) 10/30/2014 0605   HDL 38 (L) 04/05/2022 0848   HDL 20 (L) 10/27/2014 1322    CHOLHDL 1.9 04/05/2022 0848   CHOLHDL 5 03/13/2021 1317   VLDL 51 (H)  10/19/2020 0116   VLDL SEE COMMENT 10/27/2014 1322   LDLCALC 12 04/05/2022 0848   LDLCALC SEE COMMENT 10/27/2014 1322   LDLDIRECT 76.0 03/13/2021 1317      Wt Readings from Last 3 Encounters:  04/09/22 207 lb 8 oz (94.1 kg)  04/05/22 209 lb (94.8 kg)  03/17/22 219 lb 12.8 oz (99.7 kg)           12/07/2017    8:16 AM  PAD Screen  Previous PAD dx? No  Previous surgical procedure? No  Pain with walking? No  Feet/toe relief with dangling? Yes  Painful, non-healing ulcers? No  Extremities discolored? No      ASSESSMENT AND PLAN:  1.  Coronary artery disease involving native coronary arteries without angina: He is doing well at the present time.  He reports vague symptoms of palpitations at night.  I resume Toprol 25 mg once daily.  2.  Chronic systolic heart failure due to ischemic cardiomyopathy: Ejection fraction was 35 to 40% but improved to normal on most recent echocardiogram.  I resume Toprol today.  Continue lisinopril.  He appears to be euvolemic.    3.  Essential hypertension: Blood pressure is on the low side.  Given the resumption of Toprol, I elected to stop hydrochlorothiazide and continue lisinopril by itself.  4.  Hyperlipidemia: Most recent lipid profile showed an LDL of 12.  He is no longer on ezetimibe.  He was told to take rosuvastatin 40 mg every other day but I prefer that he takes 20 mg daily.    Disposition:   FU with me in 6 months.  Signed,  Kathlyn Sacramento, MD  04/09/2022 10:42 AM    [place]

## 2022-04-09 NOTE — Patient Instructions (Signed)
Medication Instructions:  Your physician has recommended you make the following change in your medication:   1) STOP Lisinopril HCTZ  2) START Lisinopril 20 mg daily  3) RESUME Metoprolol 25 mg daily  4) REDUCE Rosuvastatin (Crestor) to 20 mg daily   *If you need a refill on your cardiac medications before your next appointment, please call your pharmacy*   Lab Work: None ordered If you have labs (blood work) drawn today and your tests are completely normal, you will receive your results only by: [place] (if you have MyChart) OR A paper copy in the mail If you have any lab test that is abnormal or we need to change your treatment, we will call you to review the results.   Testing/Procedures: None ordered   Follow-Up: At [facility], you and your health needs are our priority.  As part of our continuing mission to provide you with exceptional heart care, we have created designated Provider Care Teams.  These Care Teams include your primary Cardiologist (physician) and Advanced Practice Providers (APPs -  Physician Assistants and Nurse Practitioners) who all work together to provide you with the care you need, when you need it.  We recommend signing up for the patient portal called "MyChart".  Sign up information is provided on this After Visit Summary.  MyChart is used to connect with patients for Virtual Visits (Telemedicine).  Patients are able to view lab/test results, encounter notes, upcoming appointments, etc.  Non-urgent messages can be sent to your provider as well.   To learn more about what you can do with MyChart, go to [email or web address].    Your next appointment:   6 month(s)  The format for your next appointment:   In Person  Provider:   You may see Kathlyn Sacramento, MD or one of the following Advanced Practice Providers on your designated Care Team:   Murray Hodgkins, NP Christell Faith, PA-C Cadence Kathlen Mody, PA-C Gerrie Nordmann, NP    Other  Instructions N/A  Important Information About Sugar

## 2022-04-20 ENCOUNTER — Other Ambulatory Visit (HOSPITAL_BASED_OUTPATIENT_CLINIC_OR_DEPARTMENT_OTHER): Payer: Self-pay | Admitting: Cardiovascular Disease

## 2022-04-20 LAB — CUP PACEART REMOTE DEVICE CHECK
Date Time Interrogation Session: 20231002231155
Implantable Pulse Generator Implant Date: 20220405

## 2022-04-21 ENCOUNTER — Ambulatory Visit (INDEPENDENT_AMBULATORY_CARE_PROVIDER_SITE_OTHER): Payer: BC Managed Care – PPO

## 2022-04-21 DIAGNOSIS — I255 Ischemic cardiomyopathy: Secondary | ICD-10-CM | POA: Diagnosis not present

## 2022-04-27 DIAGNOSIS — H4921 Sixth [abducent] nerve palsy, right eye: Secondary | ICD-10-CM | POA: Diagnosis not present

## 2022-04-30 ENCOUNTER — Other Ambulatory Visit: Payer: Self-pay | Admitting: Family Medicine

## 2022-04-30 NOTE — Progress Notes (Signed)
Carelink Summary Report / Loop Recorder 

## 2022-05-14 NOTE — Progress Notes (Signed)
Established patient visit   Patient: [name]   DOB: [birth year]   61 y.o. Male  MRN: [id number] Visit Date: 05/21/2022  Today's healthcare provider: Gwyneth Sprout, FNP   I,Tiffany J Bragg,acting as a scribe for Gwyneth Sprout, FNP.,have documented all relevant documentation on the behalf of Gwyneth Sprout, FNP,as directed by  Gwyneth Sprout, FNP while in the presence of Gwyneth Sprout, FNP.   Chief Complaint  Patient presents with   Hypertension   Hyperlipidemia   Diabetes   Subjective    HPI  Diabetes Mellitus Type II, follow-up  Lab Results  Component Value Date   HGBA1C 6.5 (A) 03/10/2022   HGBA1C 7.8 (H) 11/25/2021   HGBA1C 6.7 (H) 03/13/2021   Last seen for diabetes 6 weeks ago.  Management since then includes continuing the same treatment. He reports excellent compliance with treatment. He is not having side effects.   Home blood sugar records: fasting range: around 120  Episodes of hypoglycemia? Yes feel sick   Current insulin regiment: basaglar 40 units at bedtime Most Recent Eye Exam: last month  --------------------------------------------------------------------------------------------------- Hypertension, follow-up  BP Readings from Last 3 Encounters:  05/21/22 119/73  04/09/22 100/60  04/05/22 98/65   Wt Readings from Last 3 Encounters:  05/21/22 207 lb (93.9 kg)  04/09/22 207 lb 8 oz (94.1 kg)  04/05/22 209 lb (94.8 kg)     He was last seen for hypertension 6 weeks ago.  BP at that visit was 98/65. Management since that visit includes continue medications. He reports excellent compliance with treatment. He is not having side effects.  He is not exercising. He is adherent to low salt diet.   Outside blood pressures are not checked.  He does not smoke.  Use of agents associated with hypertension: none.   --------------------------------------------------------------------------------------------------- Lipid/Cholesterol,  follow-up  Last Lipid Panel: Lab Results  Component Value Date   CHOL 73 (L) 04/05/2022   LDLCALC 12 04/05/2022   LDLDIRECT 76.0 03/13/2021   HDL 38 (L) 04/05/2022   TRIG 134 04/05/2022    He was last seen for this 6 weeks ago.  Management since that visit includes continue medications.  He reports excellent compliance with treatment. He is not having side effects.   Symptoms: No appetite changes No foot ulcerations  No chest pain No chest pressure/discomfort  No dyspnea No orthopnea  No fatigue No lower extremity edema  No palpitations No paroxysmal nocturnal dyspnea  No nausea No numbness or tingling of extremity  No polydipsia No polyuria  No speech difficulty No syncope   He is following a Low Sodium diet. Current exercise: none  Last metabolic panel Lab Results  Component Value Date   GLUCOSE 175 (H) 03/17/2022   NA 137 03/17/2022   K 3.6 03/17/2022   BUN 19 03/17/2022   CREATININE 0.87 03/17/2022   EGFR 99 11/25/2021   GFRNONAA >60 03/17/2022   CALCIUM 9.5 03/17/2022   AST 19 03/17/2022   ALT 15 03/17/2022   The ASCVD Risk score (Arnett DK, et al., 2019) failed to calculate for the following reasons:   The patient has a prior MI or stroke diagnosis  ---------------------------------------------------------------------------------------------------   Medications: Outpatient Medications Prior to Visit  Medication Sig   Blood Glucose Monitoring Suppl (CONTOUR NEXT EZ) w/Device KIT See admin instructions.   BRILINTA 60 MG TABS tablet TAKE 1 TABLET BY MOUTH TWICE DAILY   Continuous Blood Gluc Receiver (FREESTYLE LIBRE 14 DAY READER) DEVI 1 each  by Does not apply route every 14 (fourteen) days.   Continuous Blood Gluc Sensor (FREESTYLE LIBRE 3 SENSOR) MISC 1 each by Does not apply route every 14 (fourteen) days. Place 1 sensor on the skin every 14 days. Use to check glucose continuously   diazepam (VALIUM) 5 MG tablet Take 1 tablet (5 mg total) by mouth every  12 (twelve) hours.   glucose blood (CONTOUR NEXT TEST) test strip Use as directed to check blood sugar 3 times daily.   Insulin Glargine (BASAGLAR KWIKPEN) 100 UNIT/ML Inject 60 Units into the skin at bedtime. Titrate to Fasting Blood Sugar of 100- 150 mg/dL with morning blood sugar check. (Patient taking differently: Inject 10-15 Units into the skin at bedtime. Titrate to Fasting Blood Sugar of 100- 150 mg/dL with morning blood sugar check.)   Insulin Pen Needle 32G X 8 MM MISC 1 each by Does not apply route at bedtime.   lisinopril (ZESTRIL) 20 MG tablet Take 1 tablet (20 mg total) by mouth daily.   melatonin 5 MG TABS Take 1 tablet (5 mg total) by mouth at bedtime.   metFORMIN (GLUCOPHAGE-XR) 750 MG 24 hr tablet Take 1 tablet (750 mg total) by mouth daily with breakfast.   methocarbamol (ROBAXIN) 500 MG tablet Take 1 tablet (500 mg total) by mouth 4 (four) times daily.   metoprolol succinate (TOPROL XL) 25 MG 24 hr tablet Take 1 tablet (25 mg total) by mouth once daily.   Multiple Vitamins-Minerals (MULTIVITAMIN ADULT, MINERALS,) TABS Take 1 tablet by mouth daily.   potassium chloride (KLOR-CON) 10 MEQ tablet Take 1 tablet (10 mEq total) by mouth daily.   rosuvastatin (CRESTOR) 20 MG tablet Take 1 tablet (20 mg total) by mouth every evening.   Semaglutide, 1 MG/DOSE, (OZEMPIC, 1 MG/DOSE,) 4 MG/3ML SOPN Inject 1 mg as directed once a week.   ticagrelor (BRILINTA) 60 MG TABS tablet Take 1 tablet (60 mg total) by mouth 2 (two) times daily.   Vitamin D, Ergocalciferol, (DRISDOL) 1.25 MG (50000 UNIT) CAPS capsule Take 1 capsule (50,000 Units total) by mouth once every 7 days.   zolpidem (AMBIEN) 10 MG tablet Take 1 tablet (10 mg total) by mouth at bedtime as needed.   aspirin EC 81 MG tablet Take 1 tablet (81 mg total) by mouth daily. (Patient not taking: Reported on 05/21/2022)   No facility-administered medications prior to visit.   Review of Systems    Objective    BP 119/73 (BP Location:  Right Arm, Patient Position: Sitting, Cuff Size: Large)   Pulse 79   Temp 97.8 F (36.6 C) (Oral)   Resp 16   Ht _0  (1.727 m)   Wt 207 lb (93.9 kg)   SpO2 99%   BMI 31.47 kg/m   Physical Exam Vitals and nursing note reviewed.  Constitutional:      Appearance: Normal appearance. He is obese.  HENT:     Head: Normocephalic and atraumatic.  Eyes:     Pupils: Pupils are equal, round, and reactive to light.  Cardiovascular:     Rate and Rhythm: Normal rate and regular rhythm.     Pulses: Normal pulses.     Heart sounds: Normal heart sounds.  Pulmonary:     Effort: Pulmonary effort is normal.     Breath sounds: Normal breath sounds.  Abdominal:     Palpations: Abdomen is soft.  Musculoskeletal:        General: Normal range of motion.     Cervical  back: Normal range of motion.  Skin:    General: Skin is warm and dry.     Capillary Refill: Capillary refill takes less than 2 seconds.  Neurological:     General: No focal deficit present.     Mental Status: He is alert and oriented to person, place, and time. Mental status is at baseline.  Psychiatric:        Mood and Affect: Mood normal.        Behavior: Behavior normal.        Thought Content: Thought content normal.        Judgment: Judgment normal.    No results found for any visits on 05/21/22.  Assessment & Plan     Problem List Items Addressed This Visit       Cardiovascular and Mediastinum   Hypertension associated with diabetes ([place])    Chronic, stable At goal <130/<80 Continue lisinopril 20 mg  Continue toprol 25 mg         Digestive   Malignant neoplasm of colon ([place])    Due for 3 year follow up for colon cancer      Relevant Orders   Ambulatory referral to Gastroenterology     Endocrine   Diabetes mellitus type 2 in obese ([facility])    Chronic, stable Body mass index is 31.47 kg/m. Not due for A1c  Last A1c at goal <7% Continue to recommend balanced, lower carb meals. Smaller meal size, adding  snacks. Choosing water as drink of choice and increasing purposeful exercise.       Hyperlipidemia associated with type 2 diabetes mellitus ([facility])    Chronic, previously overcorrected Now on crestor 20 mg for LDL goal of 55        Nervous and Auditory   6th nerve palsy, left - Primary    Chronic, improved Has been cleared for work by eye specialist Will scan in the report Plans to call HR today to get return to work date         Other   INSOMNIA, CHRONIC    Chronic, stable Continue 10 mg ambien to assist Worse with acute stress with return to work      Return in about 5 months (around 10/20/2022) for chonic disease management.     Vonna Kotyk, FNP, have reviewed all documentation for this visit. The documentation on 05/21/22 for the exam, diagnosis, procedures, and orders are all accurate and complete.  Gwyneth Sprout, [place] [phone] (phone) [phone] (fax)  [place]

## 2022-05-20 DIAGNOSIS — H4921 Sixth [abducent] nerve palsy, right eye: Secondary | ICD-10-CM | POA: Diagnosis not present

## 2022-05-21 ENCOUNTER — Ambulatory Visit (INDEPENDENT_AMBULATORY_CARE_PROVIDER_SITE_OTHER): Payer: BC Managed Care – PPO | Admitting: Family Medicine

## 2022-05-21 ENCOUNTER — Encounter: Payer: Self-pay | Admitting: Family Medicine

## 2022-05-21 VITALS — BP 119/73 | HR 79 | Temp 97.8°F | Resp 16 | Ht 68.0 in | Wt 207.0 lb

## 2022-05-21 DIAGNOSIS — I152 Hypertension secondary to endocrine disorders: Secondary | ICD-10-CM

## 2022-05-21 DIAGNOSIS — H4922 Sixth [abducent] nerve palsy, left eye: Secondary | ICD-10-CM | POA: Diagnosis not present

## 2022-05-21 DIAGNOSIS — E1159 Type 2 diabetes mellitus with other circulatory complications: Secondary | ICD-10-CM

## 2022-05-21 DIAGNOSIS — E1169 Type 2 diabetes mellitus with other specified complication: Secondary | ICD-10-CM

## 2022-05-21 DIAGNOSIS — E119 Type 2 diabetes mellitus without complications: Secondary | ICD-10-CM

## 2022-05-21 DIAGNOSIS — E669 Obesity, unspecified: Secondary | ICD-10-CM

## 2022-05-21 DIAGNOSIS — G4700 Insomnia, unspecified: Secondary | ICD-10-CM | POA: Diagnosis not present

## 2022-05-21 DIAGNOSIS — E785 Hyperlipidemia, unspecified: Secondary | ICD-10-CM

## 2022-05-21 DIAGNOSIS — C189 Malignant neoplasm of colon, unspecified: Secondary | ICD-10-CM

## 2022-05-21 NOTE — Assessment & Plan Note (Signed)
Chronic, stable Continue 10 mg ambien to assist Worse with acute stress with return to work

## 2022-05-21 NOTE — Assessment & Plan Note (Signed)
Chronic, improved Has been cleared for work by eye specialist Will scan in the report Plans to call HR today to get return to work date

## 2022-05-21 NOTE — Assessment & Plan Note (Signed)
Chronic, stable Body mass index is 31.47 kg/m. Not due for A1c  Last A1c at goal <7% Continue to recommend balanced, lower carb meals. Smaller meal size, adding snacks. Choosing water as drink of choice and increasing purposeful exercise.

## 2022-05-21 NOTE — Assessment & Plan Note (Signed)
Chronic, previously overcorrected Now on crestor 20 mg for LDL goal of 55

## 2022-05-21 NOTE — Assessment & Plan Note (Addendum)
Chronic, stable At goal <130/<80 Continue lisinopril 20 mg  Continue toprol 25 mg

## 2022-05-21 NOTE — Assessment & Plan Note (Signed)
Due for 3 year follow up for colon cancer

## 2022-05-24 ENCOUNTER — Ambulatory Visit (INDEPENDENT_AMBULATORY_CARE_PROVIDER_SITE_OTHER): Payer: BC Managed Care – PPO

## 2022-05-24 DIAGNOSIS — I255 Ischemic cardiomyopathy: Secondary | ICD-10-CM

## 2022-05-24 DIAGNOSIS — I5022 Chronic systolic (congestive) heart failure: Secondary | ICD-10-CM

## 2022-05-25 LAB — CUP PACEART REMOTE DEVICE CHECK
Date Time Interrogation Session: 20231104230916
Implantable Pulse Generator Implant Date: 20220405

## 2022-05-27 ENCOUNTER — Ambulatory Visit: Payer: BC Managed Care – PPO | Admitting: Dermatology

## 2022-06-16 ENCOUNTER — Ambulatory Visit: Payer: BC Managed Care – PPO | Admitting: Family Medicine

## 2022-06-27 ENCOUNTER — Other Ambulatory Visit: Payer: Self-pay | Admitting: Family Medicine

## 2022-06-27 DIAGNOSIS — E1165 Type 2 diabetes mellitus with hyperglycemia: Secondary | ICD-10-CM

## 2022-06-27 DIAGNOSIS — E1169 Type 2 diabetes mellitus with other specified complication: Secondary | ICD-10-CM

## 2022-06-28 ENCOUNTER — Ambulatory Visit (INDEPENDENT_AMBULATORY_CARE_PROVIDER_SITE_OTHER): Payer: BC Managed Care – PPO

## 2022-06-28 ENCOUNTER — Other Ambulatory Visit: Payer: Self-pay

## 2022-06-28 DIAGNOSIS — Z8673 Personal history of transient ischemic attack (TIA), and cerebral infarction without residual deficits: Secondary | ICD-10-CM | POA: Diagnosis not present

## 2022-06-28 DIAGNOSIS — E1165 Type 2 diabetes mellitus with hyperglycemia: Secondary | ICD-10-CM

## 2022-06-28 LAB — CUP PACEART REMOTE DEVICE CHECK
Date Time Interrogation Session: 20231210230708
Implantable Pulse Generator Implant Date: 20220405

## 2022-06-28 MED ORDER — METFORMIN HCL ER 750 MG PO TB24: 750.0000 mg | ORAL_TABLET | Freq: Every day | ORAL | 0 refills | Status: DC

## 2022-06-28 NOTE — Progress Notes (Signed)
Carelink Summary Report / Loop Recorder 

## 2022-08-02 ENCOUNTER — Ambulatory Visit (INDEPENDENT_AMBULATORY_CARE_PROVIDER_SITE_OTHER): Payer: BC Managed Care – PPO

## 2022-08-02 DIAGNOSIS — Z8673 Personal history of transient ischemic attack (TIA), and cerebral infarction without residual deficits: Secondary | ICD-10-CM

## 2022-08-04 LAB — CUP PACEART REMOTE DEVICE CHECK
Date Time Interrogation Session: 20240112230801
Implantable Pulse Generator Implant Date: 20220405

## 2022-08-05 NOTE — Progress Notes (Signed)
[facility] Summary Report / Loop Recorder

## 2022-08-26 ENCOUNTER — Other Ambulatory Visit: Payer: Self-pay | Admitting: Family Medicine

## 2022-09-05 LAB — CUP PACEART REMOTE DEVICE CHECK
Date Time Interrogation Session: 20240214230732
Implantable Pulse Generator Implant Date: 20220405

## 2022-09-06 ENCOUNTER — Ambulatory Visit (INDEPENDENT_AMBULATORY_CARE_PROVIDER_SITE_OTHER): Payer: BC Managed Care – PPO

## 2022-09-06 DIAGNOSIS — Z8673 Personal history of transient ischemic attack (TIA), and cerebral infarction without residual deficits: Secondary | ICD-10-CM | POA: Diagnosis not present

## 2022-09-17 NOTE — Progress Notes (Signed)
Carelink Summary Report / Loop Recorder 

## 2022-10-08 ENCOUNTER — Ambulatory Visit: Payer: BC Managed Care – PPO | Admitting: Cardiovascular Disease

## 2022-10-09 LAB — CUP PACEART REMOTE DEVICE CHECK
Date Time Interrogation Session: 20240318230631
Implantable Pulse Generator Implant Date: 20220405

## 2022-10-11 ENCOUNTER — Ambulatory Visit (INDEPENDENT_AMBULATORY_CARE_PROVIDER_SITE_OTHER): Payer: BC Managed Care – PPO

## 2022-10-11 DIAGNOSIS — Z8673 Personal history of transient ischemic attack (TIA), and cerebral infarction without residual deficits: Secondary | ICD-10-CM | POA: Diagnosis not present

## 2022-10-20 NOTE — Progress Notes (Signed)
Carelink Summary Report / Loop Recorder 

## 2022-11-08 LAB — CUP PACEART REMOTE DEVICE CHECK
Date Time Interrogation Session: 20240420230233
Implantable Pulse Generator Implant Date: 20220405

## 2022-11-11 ENCOUNTER — Ambulatory Visit (INDEPENDENT_AMBULATORY_CARE_PROVIDER_SITE_OTHER): Payer: BC Managed Care – PPO

## 2022-11-11 DIAGNOSIS — I255 Ischemic cardiomyopathy: Secondary | ICD-10-CM

## 2022-11-11 NOTE — Progress Notes (Signed)
I,J'ya E Hunter,acting as a scribe for Jacky Kindle, FNP.,have documented all relevant documentation on the behalf of Jacky Kindle, FNP,as directed by  Jacky Kindle, FNP while in the presence of Jacky Kindle, FNP.  Established patient visit  Patient: [name]   DOB: [birth year]   62 y.o. Male  MRN: [id number] Visit Date: 11/12/2022  Today's healthcare provider: Jacky Kindle, FNP  Introduced to nurse practitioner role and practice setting.  All questions answered.  Discussed provider/patient relationship and expectations.  Subjective    HPI  Diabetes Mellitus Type II, Follow-up  Lab Results  Component Value Date   HGBA1C 6.5 11/12/2022   HGBA1C 6.5 (A) 03/10/2022   HGBA1C 7.8 (H) 11/25/2021   Wt Readings from Last 3 Encounters:  05/21/22 207 lb (93.9 kg)  04/09/22 207 lb 8 oz (94.1 kg)  04/05/22 209 lb (94.8 kg)   Last seen for diabetes 6 months ago.  Management since then includes lifestyle modifcations. He reports excellent compliance with treatment. He is not having side effects.  Symptoms: No fatigue No foot ulcerations  No appetite changes No nausea  No paresthesia of the feet  No polydipsia  No polyuria No visual disturbances   No vomiting     Home blood sugar records: yes; use of CGM  Episodes of hypoglycemia? Yes; noted BG in 40s with symptoms    Current insulin regiment: glargine- titration of 10-15 units qHS, also reports use of "fast acting" insulin; however, not on med rec list  Most Recent Eye Exam: unknown Current exercise: none Current diet habits: well balanced  Pertinent Labs: Lab Results  Component Value Date   CHOL 73 (L) 04/05/2022   HDL 38 (L) 04/05/2022   LDLCALC 12 04/05/2022   LDLDIRECT 76.0 03/13/2021   TRIG 134 04/05/2022   CHOLHDL 1.9 04/05/2022   Lab Results  Component Value Date   NA 137 03/17/2022   K 3.6 03/17/2022   CREATININE 0.87 03/17/2022   GFRNONAA >60 03/17/2022   LABMICR <3.0 11/25/2021   MICRALBCREAT <2  11/25/2021     ---------------------------------------------------------------------------------------------------    Hypertension, follow-up  BP Readings from Last 3 Encounters:  05/21/22 119/73  04/09/22 100/60  04/05/22 98/65   Wt Readings from Last 3 Encounters:  05/21/22 207 lb (93.9 kg)  04/09/22 207 lb 8 oz (94.1 kg)  04/05/22 209 lb (94.8 kg)     He was last seen for hypertension 6 months ago.  BP at that visit was 119/73. Management since that visit includes continue lisinopril 20 mg and toprol 25 mg .  He reports excellent compliance with treatment. He is not having side effects.  He is following a Regular diet. He is not exercising.  He does not smoke.  Outside blood pressures are not being measured Symptoms: No chest pain No chest pressure  No palpitations No syncope  No dyspnea No orthopnea  No paroxysmal nocturnal dyspnea No lower extremity edema   Pertinent labs Lab Results  Component Value Date   CHOL 73 (L) 04/05/2022   HDL 38 (L) 04/05/2022   LDLCALC 12 04/05/2022   LDLDIRECT 76.0 03/13/2021   TRIG 134 04/05/2022   CHOLHDL 1.9 04/05/2022   Lab Results  Component Value Date   NA 137 03/17/2022   K 3.6 03/17/2022   CREATININE 0.87 03/17/2022   GFRNONAA >60 03/17/2022   GLUCOSE 175 (H) 03/17/2022   TSH 2.197 10/20/2020     The ASCVD Risk score (Arnett DK, et  al., 2019) failed to calculate for the following reasons:   The patient has a prior MI or stroke diagnosis  ---------------------------------------------------------------------------------------------------  Medications: Outpatient Medications Prior to Visit  Medication Sig   aspirin EC 81 MG tablet Take 1 tablet (81 mg total) by mouth daily.   Blood Glucose Monitoring Suppl (CONTOUR NEXT EZ) w/Device KIT See admin instructions.   BRILINTA 60 MG TABS tablet TAKE 1 TABLET BY MOUTH TWICE DAILY   Continuous Blood Gluc Receiver (FREESTYLE LIBRE 14 DAY READER) DEVI 1 each by Does not apply  route every 14 (fourteen) days.   Continuous Blood Gluc Sensor (FREESTYLE LIBRE 3 SENSOR) MISC 1 each by Does not apply route every 14 (fourteen) days. Place 1 sensor on the skin every 14 days. Use to check glucose continuously   diazepam (VALIUM) 5 MG tablet Take 1 tablet (5 mg total) by mouth every 12 (twelve) hours.   glucose blood (CONTOUR NEXT TEST) test strip Use as directed to check blood sugar 3 times daily.   Insulin Glargine (BASAGLAR KWIKPEN) 100 UNIT/ML Inject 60 Units into the skin at bedtime. Titrate to Fasting Blood Sugar of 100- 150 mg/dL with morning blood sugar check. (Patient taking differently: Inject 10-15 Units into the skin at bedtime. Titrate to Fasting Blood Sugar of 100- 150 mg/dL with morning blood sugar check.)   Insulin Pen Needle 32G X 8 MM MISC 1 each by Does not apply route at bedtime.   lisinopril (ZESTRIL) 20 MG tablet Take 1 tablet (20 mg total) by mouth daily.   melatonin 5 MG TABS Take 1 tablet (5 mg total) by mouth at bedtime.   metFORMIN (GLUCOPHAGE-XR) 750 MG 24 hr tablet Take 1 tablet (750 mg total) by mouth daily with breakfast.   methocarbamol (ROBAXIN) 500 MG tablet Take 1 tablet (500 mg total) by mouth 4 (four) times daily.   metoprolol succinate (TOPROL XL) 25 MG 24 hr tablet Take 1 tablet (25 mg total) by mouth once daily.   Multiple Vitamins-Minerals (MULTIVITAMIN ADULT, MINERALS,) TABS Take 1 tablet by mouth daily.   potassium chloride (KLOR-CON) 10 MEQ tablet Take 1 tablet (10 mEq total) by mouth daily.   rosuvastatin (CRESTOR) 20 MG tablet Take 1 tablet (20 mg total) by mouth every evening.   ticagrelor (BRILINTA) 60 MG TABS tablet Take 1 tablet (60 mg total) by mouth 2 (two) times daily.   Vitamin D, Ergocalciferol, (DRISDOL) 1.25 MG (50000 UNIT) CAPS capsule Take 1 capsule (50,000 Units total) by mouth once every 7 days.   [DISCONTINUED] Semaglutide, 1 MG/DOSE, (OZEMPIC, 1 MG/DOSE,) 4 MG/3ML SOPN Inject 1 mg as directed once a week.    [DISCONTINUED] zolpidem (AMBIEN) 10 MG tablet Take 1 tablet (10 mg total) by mouth at bedtime as needed.   No facility-administered medications prior to visit.   Review of Systems    Objective    There were no vitals taken for this visit.  Physical Exam Vitals and nursing note reviewed.  Constitutional:      Appearance: Normal appearance. He is obese.  HENT:     Head: Normocephalic and atraumatic.  Eyes:     Pupils: Pupils are equal, round, and reactive to light.  Cardiovascular:     Rate and Rhythm: Normal rate and regular rhythm.     Pulses: Normal pulses.     Heart sounds: Normal heart sounds.  Pulmonary:     Effort: Pulmonary effort is normal.     Breath sounds: Normal breath sounds.  Abdominal:  General: Bowel sounds are normal.     Palpations: Abdomen is soft.  Musculoskeletal:        General: Swelling present. Normal range of motion.     Cervical back: Normal range of motion.     Right lower leg: Edema present.     Left lower leg: Edema present.     Comments: Trace/+1 edema to BLE   Skin:    General: Skin is warm and dry.     Capillary Refill: Capillary refill takes less than 2 seconds.  Neurological:     General: No focal deficit present.     Mental Status: He is alert and oriented to person, place, and time. Mental status is at baseline.  Psychiatric:        Mood and Affect: Mood normal.        Behavior: Behavior normal.        Thought Content: Thought content normal.        Judgment: Judgment normal.     Results for orders placed or performed in visit on 11/12/22  POCT HgB A1C  Result Value Ref Range   Hemoglobin A1C     HbA1c POC (<> result, manual entry)     HbA1c, POC (prediabetic range)     HbA1c, POC (controlled diabetic range) 6.5 0.0 - 7.0 %    Assessment & Plan     Problem List Items Addressed This Visit       Cardiovascular and Mediastinum   Atherosclerosis of native coronary artery of native heart with angina pectoris (HCC)     Chronic, stable Continue to recommend BP control as well as heart healthy diet and exercise to assist       Chronic systolic heart failure (HCC)    Chronic, stable Minimal weight change LE edema remains  Patient notes dietary indiscretions and plans to restart on ozempic        Hypertension associated with diabetes (HCC)    Chronic, stable Goal <119/<79 for best prevention of further ASCVD issues Continue  -asa 81 mg -lisinopril 20 mg -toprol 25 mg -kclor 10 mEq qd -crestor 20 mg QD -brilinta 60 mg BID        Endocrine   Diabetes mellitus (HCC) - Primary   Relevant Orders   POCT HgB A1C (Completed)   Hyperlipidemia associated with type 2 diabetes mellitus (HCC)    Chronic, previously borderline Pt declines blood work at this time LDL goal of <70 recommend diet low in saturated fat and regular exercise - 30 min at least 5 times per week Remains on crestor 20 mg         Musculoskeletal and Integument   Psoriatic arthritis (HCC)    Chronic, stable Continues to follow with rheum        Other   Colon cancer screening declined    Patient declines any further preventive screening for colon cancer; notes that he has too much to work and his job is causing too much stress for him to focus on doing a colon prep for a repeat imaging scan       RESOLVED: Difficulty falling asleep at night until early morning hours   History of colon cancer    Patient declines any further preventive screening for colon cancer; notes that he has too much to work and his job is causing too much stress for him to focus on doing a colon prep for a repeat imaging scan       Hyperlipidemia LDL goal <  70    Recommend crestor 20 mg to assist with meeting LDL goal of <70 given previous hx of CVA      Psychophysiological insomnia    Chronic, stable Request for refills of ambien 10 mg Recommend 6 month f/u       Relevant Medications   zolpidem (AMBIEN) 10 MG tablet   Return in about 3  months (around 02/11/2023) for annual examination. OK for 6 months if pt prefers.    Leilani Merl, FNP, have reviewed all documentation for this visit. The documentation on 11/12/22 for the exam, diagnosis, procedures, and orders are all accurate and complete.  Jacky Kindle, FNP  [facility] [phone] (phone) [phone] (fax)  [facility]

## 2022-11-12 ENCOUNTER — Other Ambulatory Visit: Payer: Self-pay | Admitting: Family Medicine

## 2022-11-12 ENCOUNTER — Ambulatory Visit (INDEPENDENT_AMBULATORY_CARE_PROVIDER_SITE_OTHER): Payer: BC Managed Care – PPO | Admitting: Family Medicine

## 2022-11-12 ENCOUNTER — Encounter: Payer: Self-pay | Admitting: Family Medicine

## 2022-11-12 ENCOUNTER — Other Ambulatory Visit: Payer: Self-pay

## 2022-11-12 VITALS — BP 139/79 | HR 67 | Temp 98.2°F | Ht 67.0 in | Wt 223.0 lb

## 2022-11-12 DIAGNOSIS — E1159 Type 2 diabetes mellitus with other circulatory complications: Secondary | ICD-10-CM

## 2022-11-12 DIAGNOSIS — E1169 Type 2 diabetes mellitus with other specified complication: Secondary | ICD-10-CM

## 2022-11-12 DIAGNOSIS — L4050 Arthropathic psoriasis, unspecified: Secondary | ICD-10-CM

## 2022-11-12 DIAGNOSIS — Z794 Long term (current) use of insulin: Secondary | ICD-10-CM | POA: Diagnosis not present

## 2022-11-12 DIAGNOSIS — I5022 Chronic systolic (congestive) heart failure: Secondary | ICD-10-CM | POA: Diagnosis not present

## 2022-11-12 DIAGNOSIS — E11649 Type 2 diabetes mellitus with hypoglycemia without coma: Secondary | ICD-10-CM

## 2022-11-12 DIAGNOSIS — F5104 Psychophysiologic insomnia: Secondary | ICD-10-CM | POA: Diagnosis not present

## 2022-11-12 DIAGNOSIS — Z85038 Personal history of other malignant neoplasm of large intestine: Secondary | ICD-10-CM | POA: Diagnosis not present

## 2022-11-12 DIAGNOSIS — I25119 Atherosclerotic heart disease of native coronary artery with unspecified angina pectoris: Secondary | ICD-10-CM | POA: Diagnosis not present

## 2022-11-12 DIAGNOSIS — G4700 Insomnia, unspecified: Secondary | ICD-10-CM

## 2022-11-12 DIAGNOSIS — E785 Hyperlipidemia, unspecified: Secondary | ICD-10-CM

## 2022-11-12 DIAGNOSIS — E1165 Type 2 diabetes mellitus with hyperglycemia: Secondary | ICD-10-CM

## 2022-11-12 DIAGNOSIS — Z5320 Procedure and treatment not carried out because of patient's decision for unspecified reasons: Secondary | ICD-10-CM | POA: Insufficient documentation

## 2022-11-12 DIAGNOSIS — I152 Hypertension secondary to endocrine disorders: Secondary | ICD-10-CM

## 2022-11-12 LAB — POCT GLYCOSYLATED HEMOGLOBIN (HGB A1C): HbA1c, POC (controlled diabetic range): 6.5 % (ref 0.0–7.0)

## 2022-11-12 MED ORDER — ZOLPIDEM TARTRATE 10 MG PO TABS: 10.0000 mg | ORAL_TABLET | Freq: Every evening | ORAL | 5 refills | Status: DC | PRN

## 2022-11-12 MED ORDER — SEMAGLUTIDE(0.25 OR 0.5MG/DOS) 2 MG/1.5ML ~~LOC~~ SOPN: 0.5000 mg | PEN_INJECTOR | SUBCUTANEOUS | 0 refills | Status: DC

## 2022-11-12 MED ORDER — TIRZEPATIDE 5 MG/0.5ML ~~LOC~~ SOAJ: 5.0000 mg | SUBCUTANEOUS | 0 refills | Status: DC

## 2022-11-12 NOTE — Patient Instructions (Signed)
The CDC recommends two doses of Shingrix (the new shingles vaccine) separated by 2 to 6 months for adults age 60 years and older. I recommend checking with your insurance plan regarding coverage for this vaccine.    

## 2022-11-12 NOTE — Assessment & Plan Note (Signed)
Chronic, stable Request for refills of ambien 10 mg Recommend 6 month f/u

## 2022-11-12 NOTE — Assessment & Plan Note (Signed)
Chronic, stable Continue to recommend BP control as well as heart healthy diet and exercise to assist

## 2022-11-12 NOTE — Assessment & Plan Note (Signed)
Chronic, previously borderline Pt declines blood work at this time LDL goal of <70 recommend diet low in saturated fat and regular exercise - 30 min at least 5 times per week Remains on crestor 20 mg

## 2022-11-12 NOTE — Assessment & Plan Note (Signed)
Chronic, stable Continues to follow with rheum

## 2022-11-12 NOTE — Assessment & Plan Note (Signed)
Chronic, stable Minimal weight change LE edema remains  Patient notes dietary indiscretions and plans to restart on ozempic

## 2022-11-12 NOTE — Assessment & Plan Note (Signed)
Patient declines any further preventive screening for colon cancer; notes that he has too much to work and his job is causing too much stress for him to focus on doing a colon prep for a repeat imaging scan

## 2022-11-12 NOTE — Assessment & Plan Note (Signed)
Recommend crestor 20 mg to assist with meeting LDL goal of <70 given previous hx of CVA

## 2022-11-12 NOTE — Assessment & Plan Note (Signed)
Patient declines any further preventive screening for colon cancer; notes that he has too much to work and his job is causing too much stress for him to focus on doing a colon prep for a repeat imaging scan  

## 2022-11-12 NOTE — Assessment & Plan Note (Signed)
Chronic, stable Goal <119/<79 for best prevention of further ASCVD issues Continue  -asa 81 mg -lisinopril 20 mg -toprol 25 mg -kclor 10 mEq qd -crestor 20 mg QD -brilinta 60 mg BID

## 2022-11-15 ENCOUNTER — Telehealth: Payer: Self-pay

## 2022-11-15 NOTE — Telephone Encounter (Signed)
Contacting pt to let him know that his PA for Ozempic has been approved.

## 2022-11-22 NOTE — Progress Notes (Signed)
Carelink Summary Report / Loop Recorder 

## 2022-12-07 NOTE — Progress Notes (Signed)
Carelink Summary Report / Loop Recorder 

## 2022-12-09 ENCOUNTER — Ambulatory Visit (INDEPENDENT_AMBULATORY_CARE_PROVIDER_SITE_OTHER): Payer: BC Managed Care – PPO

## 2022-12-09 DIAGNOSIS — I255 Ischemic cardiomyopathy: Secondary | ICD-10-CM

## 2022-12-10 LAB — CUP PACEART REMOTE DEVICE CHECK
Date Time Interrogation Session: 20240523230240
Implantable Pulse Generator Implant Date: 20220405

## 2022-12-16 DIAGNOSIS — I255 Ischemic cardiomyopathy: Secondary | ICD-10-CM | POA: Diagnosis not present

## 2022-12-28 ENCOUNTER — Other Ambulatory Visit: Payer: Self-pay | Admitting: Family Medicine

## 2022-12-28 DIAGNOSIS — E1165 Type 2 diabetes mellitus with hyperglycemia: Secondary | ICD-10-CM

## 2022-12-28 DIAGNOSIS — I152 Hypertension secondary to endocrine disorders: Secondary | ICD-10-CM

## 2022-12-28 DIAGNOSIS — E785 Hyperlipidemia, unspecified: Secondary | ICD-10-CM

## 2022-12-29 NOTE — Progress Notes (Signed)
Carelink Summary Report / Loop Recorder 

## 2023-01-11 ENCOUNTER — Ambulatory Visit (INDEPENDENT_AMBULATORY_CARE_PROVIDER_SITE_OTHER): Payer: BC Managed Care – PPO

## 2023-01-11 DIAGNOSIS — I255 Ischemic cardiomyopathy: Secondary | ICD-10-CM

## 2023-01-12 LAB — CUP PACEART REMOTE DEVICE CHECK
Date Time Interrogation Session: 20240625230611
Implantable Pulse Generator Implant Date: 20220405

## 2023-01-16 DIAGNOSIS — I255 Ischemic cardiomyopathy: Secondary | ICD-10-CM | POA: Diagnosis not present

## 2023-01-28 ENCOUNTER — Ambulatory Visit: Payer: BC Managed Care – PPO | Admitting: Cardiovascular Disease

## 2023-02-03 NOTE — Progress Notes (Signed)
Carelink Summary Report / Loop Recorder 

## 2023-02-14 ENCOUNTER — Ambulatory Visit (INDEPENDENT_AMBULATORY_CARE_PROVIDER_SITE_OTHER): Payer: BC Managed Care – PPO

## 2023-02-14 DIAGNOSIS — I255 Ischemic cardiomyopathy: Secondary | ICD-10-CM

## 2023-02-16 DIAGNOSIS — I255 Ischemic cardiomyopathy: Secondary | ICD-10-CM | POA: Diagnosis not present

## 2023-03-03 NOTE — Progress Notes (Signed)
Carelink Summary Report / Loop Recorder 

## 2023-03-11 DIAGNOSIS — Z79899 Other long term (current) drug therapy: Secondary | ICD-10-CM | POA: Diagnosis not present

## 2023-03-11 DIAGNOSIS — L4050 Arthropathic psoriasis, unspecified: Secondary | ICD-10-CM | POA: Diagnosis not present

## 2023-03-11 DIAGNOSIS — L409 Psoriasis, unspecified: Secondary | ICD-10-CM | POA: Diagnosis not present

## 2023-03-11 DIAGNOSIS — Z85038 Personal history of other malignant neoplasm of large intestine: Secondary | ICD-10-CM | POA: Diagnosis not present

## 2023-03-20 ENCOUNTER — Other Ambulatory Visit: Payer: Self-pay | Admitting: Family Medicine

## 2023-03-20 DIAGNOSIS — E1165 Type 2 diabetes mellitus with hyperglycemia: Secondary | ICD-10-CM

## 2023-03-22 ENCOUNTER — Ambulatory Visit (INDEPENDENT_AMBULATORY_CARE_PROVIDER_SITE_OTHER): Payer: BC Managed Care – PPO

## 2023-03-22 DIAGNOSIS — I639 Cerebral infarction, unspecified: Secondary | ICD-10-CM

## 2023-03-22 LAB — CUP PACEART REMOTE DEVICE CHECK
Date Time Interrogation Session: 20240830230418
Implantable Pulse Generator Implant Date: 20220405

## 2023-03-23 ENCOUNTER — Telehealth: Payer: Self-pay

## 2023-03-23 ENCOUNTER — Telehealth: Payer: Self-pay | Admitting: Family Medicine

## 2023-03-23 NOTE — Telephone Encounter (Signed)
Left vm advising pt to call back to schedule an appointment.  Okay for [facility] to schedule.

## 2023-03-23 NOTE — Telephone Encounter (Signed)
Copied from CRM [id number]. Topic: General - Other >> Mar 23, 2023  2:48 PM Everette C wrote: Reason for CRM: The patient has called to follow up on a previous instruction from their pharmacy directing them to contact their PCP directly and request a refill of fast acting insulin   The patient has stressed and emphasized that their insulin needs to be fast acting   Please contact the patient further when possible

## 2023-03-23 NOTE — Telephone Encounter (Signed)
Requested medication (s) are due for refill today: Yes  Requested medication (s) are on the active medication list: Yes  Last refill:  12/28/21  Future visit scheduled: Yes  Notes to clinic:  Unsure if patient is still under provider's care     Requested Prescriptions  Pending Prescriptions Disp Refills   Insulin Glargine (BASAGLAR KWIKPEN) 100 UNIT/ML [Pharmacy Med Name: BASAGLAR 100 U/ML KWIKPEN INJ 3ML] 60 mL 0    Sig: INJECT 60 UNITS UNDER THE SKIN AT BEDTIME. TITRATE TO FASTING BLOOD SUGAR OF 100 TO 150 MG/DL WITH MORNING BLOOD SUGAR CHECK     Endocrinology:  Diabetes - Insulins Passed - 03/20/2023  2:09 PM      Passed - HBA1C is between 0 and 7.9 and within 180 days    Hemoglobin A1C  Date Value Ref Range Status  10/27/2014 11.8 (H) % Final    Comment:    4.0-6.0 NOTE: New Reference Range  09/24/14    HbA1c, POC (controlled diabetic range)  Date Value Ref Range Status  11/12/2022 6.5 0.0 - 7.0 % Final         Passed - Valid encounter within last 6 months    Recent Outpatient Visits           4 months ago Type 2 diabetes mellitus with hypoglycemia without coma, with long-term current use of insulin (HCC)   [place] [facility] Merita Norton T, FNP   10 months ago 6th nerve palsy, left   [facility] Merita Norton T, FNP   11 months ago 6th nerve palsy, left   [facility] Merita Norton T, FNP   1 year ago Type 2 diabetes mellitus with hyperglycemia, without long-term current use of insulin [facility])   [facility] Merita Norton T, FNP   1 year ago Type 2 diabetes mellitus with hyperglycemia, without long-term current use of insulin [facility])   [place] [facility] Jacky Kindle, FNP       Future Appointments             In 1 month Jacky Kindle, FNP [facility], [facility]   In 2 months Iran Ouch, MD [facility]  [facility]             insulin lispro (HUMALOG) 100 UNIT/ML KwikPen [Pharmacy Med Name: INSULIN LISPRO 100U/ML KWIKPEN 3ML] 15 mL     Sig: INJECT UNDER THE SKIN THREE TIMES DAILY BEFORE MEALS PER SLIDING SCALE 150-200=2 UNITS, 201-250=4, 251-300=6,301-350=8,351-400=10,400=12     Endocrinology:  Diabetes - Insulins Passed - 03/20/2023  2:09 PM      Passed - HBA1C is between 0 and 7.9 and within 180 days    Hemoglobin A1C  Date Value Ref Range Status  10/27/2014 11.8 (H) % Final    Comment:    4.0-6.0 NOTE: New Reference Range  09/24/14    HbA1c, POC (controlled diabetic range)  Date Value Ref Range Status  11/12/2022 6.5 0.0 - 7.0 % Final         Passed - Valid encounter within last 6 months    Recent Outpatient Visits           4 months ago Type 2 diabetes mellitus with hypoglycemia without coma, with long-term current use of insulin [facility])   [place] [facility] Merita Norton T, FNP   10 months ago 6th nerve palsy, left   [facility] Merita Norton T, FNP   11 months ago 6th nerve palsy, left   [facility] Merita Norton T, FNP   1 year ago Type 2 diabetes mellitus with hyperglycemia, without long-term current use of insulin [facility])   [place] [facility] Merita Norton T, FNP   1 year ago Type 2 diabetes mellitus with hyperglycemia, without long-term current use of insulin [facility])   [facility] Jacky Kindle, FNP       Future Appointments             In 1 month Suzie Portela, Daryl Eastern, FNP [facility], [facility]   In 2 months Kirke Corin, Chelsea Aus, MD [facility]

## 2023-03-23 NOTE — Telephone Encounter (Signed)
Pt is calling in because he tried to get his Insulin refilled and the pharmacy told me he needed to contact the office. Pt received a voicemail from the office to schedule an appointment for his refill. Pt is refusing to schedule another appointment since he already has an appointment 05/20/23. Pt says he needs his insulin and would like it sent in. Please follow up with pt.

## 2023-03-24 ENCOUNTER — Other Ambulatory Visit: Payer: Self-pay | Admitting: Family Medicine

## 2023-03-24 DIAGNOSIS — E1165 Type 2 diabetes mellitus with hyperglycemia: Secondary | ICD-10-CM

## 2023-03-24 MED ORDER — INSULIN LISPRO (0.5 UNIT DIAL) 100 UNIT/ML (KWIKPEN JR): 5.0000 [IU] | PEN_INJECTOR | Freq: Three times a day (TID) | SUBCUTANEOUS | 0 refills | Status: DC

## 2023-03-24 MED ORDER — BASAGLAR KWIKPEN 100 UNIT/ML ~~LOC~~ SOPN: 15.0000 [IU] | PEN_INJECTOR | Freq: Every day | SUBCUTANEOUS | 0 refills | Status: DC

## 2023-03-24 NOTE — Telephone Encounter (Signed)
Duplicate encounter. See other messages

## 2023-03-24 NOTE — Telephone Encounter (Signed)
I left him a vm yesterday (03/23/2023) afternoon to call back to schedule an appt.  Okay for [facility] to schedule

## 2023-03-31 NOTE — Progress Notes (Signed)
Carelink Summary Report / Loop Recorder 

## 2023-04-22 ENCOUNTER — Ambulatory Visit: Payer: BC Managed Care – PPO | Admitting: Cardiovascular Disease

## 2023-04-25 ENCOUNTER — Ambulatory Visit (INDEPENDENT_AMBULATORY_CARE_PROVIDER_SITE_OTHER): Payer: BC Managed Care – PPO

## 2023-04-25 DIAGNOSIS — I639 Cerebral infarction, unspecified: Secondary | ICD-10-CM

## 2023-04-25 LAB — CUP PACEART REMOTE DEVICE CHECK
Date Time Interrogation Session: 20241006231239
Implantable Pulse Generator Implant Date: 20220405

## 2023-05-04 ENCOUNTER — Other Ambulatory Visit: Payer: Self-pay | Admitting: Family Medicine

## 2023-05-04 DIAGNOSIS — E1165 Type 2 diabetes mellitus with hyperglycemia: Secondary | ICD-10-CM

## 2023-05-05 NOTE — Telephone Encounter (Signed)
Refused Ozempic 1 mg because it was discontinued 11/12/2022.

## 2023-05-06 ENCOUNTER — Other Ambulatory Visit: Payer: Self-pay | Admitting: Family Medicine

## 2023-05-06 DIAGNOSIS — E1165 Type 2 diabetes mellitus with hyperglycemia: Secondary | ICD-10-CM

## 2023-05-06 MED ORDER — INSULIN LISPRO (0.5 UNIT DIAL) 100 UNIT/ML (KWIKPEN JR): 5.0000 [IU] | PEN_INJECTOR | Freq: Three times a day (TID) | SUBCUTANEOUS | 0 refills | Status: DC

## 2023-05-06 MED ORDER — SEMAGLUTIDE (2 MG/DOSE) 8 MG/3ML ~~LOC~~ SOPN: 2.0000 mg | PEN_INJECTOR | SUBCUTANEOUS | 11 refills | Status: DC

## 2023-05-06 MED ORDER — BASAGLAR KWIKPEN 100 UNIT/ML ~~LOC~~ SOPN
15.0000 [IU] | PEN_INJECTOR | Freq: Every day | SUBCUTANEOUS | 0 refills | Status: DC
Start: 2023-05-06 — End: 2023-05-20

## 2023-05-06 NOTE — Telephone Encounter (Addendum)
Can you look at this and see if you can refill Ozempic.   He is out of this med since 6 weeks.  He states that he has been taking Basaglar and Humalog since you prescribed it 03/24/2023.  He states that he has one left of each of those.  He states that if it has to wait until his appt on 05/20/2023 that is fine.  He didn't understand why it was denied.

## 2023-05-10 NOTE — Progress Notes (Signed)
Carelink Summary Report / Loop Recorder 

## 2023-05-13 ENCOUNTER — Encounter: Payer: BC Managed Care – PPO | Admitting: Family Medicine

## 2023-05-20 ENCOUNTER — Encounter: Payer: Self-pay | Admitting: Family Medicine

## 2023-05-20 ENCOUNTER — Ambulatory Visit: Payer: BC Managed Care – PPO | Admitting: Family Medicine

## 2023-05-20 VITALS — BP 111/57 | HR 93 | Wt 216.4 lb

## 2023-05-20 DIAGNOSIS — Z8639 Personal history of other endocrine, nutritional and metabolic disease: Secondary | ICD-10-CM | POA: Insufficient documentation

## 2023-05-20 DIAGNOSIS — E1169 Type 2 diabetes mellitus with other specified complication: Secondary | ICD-10-CM | POA: Diagnosis not present

## 2023-05-20 DIAGNOSIS — I5022 Chronic systolic (congestive) heart failure: Secondary | ICD-10-CM

## 2023-05-20 DIAGNOSIS — Z1211 Encounter for screening for malignant neoplasm of colon: Secondary | ICD-10-CM | POA: Insufficient documentation

## 2023-05-20 DIAGNOSIS — E785 Hyperlipidemia, unspecified: Secondary | ICD-10-CM

## 2023-05-20 DIAGNOSIS — I252 Old myocardial infarction: Secondary | ICD-10-CM

## 2023-05-20 DIAGNOSIS — E11649 Type 2 diabetes mellitus with hypoglycemia without coma: Secondary | ICD-10-CM | POA: Diagnosis not present

## 2023-05-20 DIAGNOSIS — I25118 Atherosclerotic heart disease of native coronary artery with other forms of angina pectoris: Secondary | ICD-10-CM

## 2023-05-20 DIAGNOSIS — F5104 Psychophysiologic insomnia: Secondary | ICD-10-CM

## 2023-05-20 DIAGNOSIS — L4050 Arthropathic psoriasis, unspecified: Secondary | ICD-10-CM | POA: Diagnosis not present

## 2023-05-20 DIAGNOSIS — F430 Acute stress reaction: Secondary | ICD-10-CM

## 2023-05-20 DIAGNOSIS — Z794 Long term (current) use of insulin: Secondary | ICD-10-CM | POA: Diagnosis not present

## 2023-05-20 DIAGNOSIS — Z8673 Personal history of transient ischemic attack (TIA), and cerebral infarction without residual deficits: Secondary | ICD-10-CM

## 2023-05-20 DIAGNOSIS — E1159 Type 2 diabetes mellitus with other circulatory complications: Secondary | ICD-10-CM | POA: Diagnosis not present

## 2023-05-20 DIAGNOSIS — Z85038 Personal history of other malignant neoplasm of large intestine: Secondary | ICD-10-CM

## 2023-05-20 DIAGNOSIS — I152 Hypertension secondary to endocrine disorders: Secondary | ICD-10-CM | POA: Diagnosis not present

## 2023-05-20 DIAGNOSIS — Z0000 Encounter for general adult medical examination without abnormal findings: Secondary | ICD-10-CM | POA: Diagnosis not present

## 2023-05-20 MED ORDER — SEMAGLUTIDE (2 MG/DOSE) 8 MG/3ML ~~LOC~~ SOPN: 2.0000 mg | PEN_INJECTOR | SUBCUTANEOUS | 11 refills | Status: DC

## 2023-05-20 MED ORDER — RSVPREF3 VAC RECOMB ADJUVANTED 120 MCG/0.5ML IM SUSR: 0.5000 mL | Freq: Once | INTRAMUSCULAR | 0 refills | Status: AC

## 2023-05-20 MED ORDER — INSULIN LISPRO (1 UNIT DIAL) 100 UNIT/ML (KWIKPEN): 10.0000 [IU] | PEN_INJECTOR | Freq: Three times a day (TID) | SUBCUTANEOUS | 5 refills | Status: DC

## 2023-05-20 MED ORDER — DIAZEPAM 2 MG PO TABS: 2.0000 mg | ORAL_TABLET | Freq: Every day | ORAL | 0 refills | Status: DC | PRN

## 2023-05-20 MED ORDER — BASAGLAR KWIKPEN 100 UNIT/ML ~~LOC~~ SOPN: 15.0000 [IU] | PEN_INJECTOR | Freq: Every day | SUBCUTANEOUS | 5 refills | Status: DC

## 2023-05-20 MED ORDER — METFORMIN HCL ER 750 MG PO TB24
750.0000 mg | ORAL_TABLET | Freq: Two times a day (BID) | ORAL | 3 refills | Status: AC
Start: 2023-05-20 — End: ?

## 2023-05-20 MED ORDER — LISINOPRIL 20 MG PO TABS: 20.0000 mg | ORAL_TABLET | Freq: Every day | ORAL | 3 refills | Status: DC

## 2023-05-20 MED ORDER — ZOLPIDEM TARTRATE 10 MG PO TABS: 10.0000 mg | ORAL_TABLET | Freq: Every evening | ORAL | 5 refills | Status: DC | PRN

## 2023-05-20 MED ORDER — QUETIAPINE FUMARATE 100 MG PO TABS: 100.0000 mg | ORAL_TABLET | Freq: Every day | ORAL | 5 refills | Status: DC

## 2023-05-20 MED ORDER — CONTOUR NEXT TEST VI STRP: ORAL_STRIP | 12 refills | Status: DC

## 2023-05-20 MED ORDER — ZOSTER VAC RECOMB ADJUVANTED 50 MCG/0.5ML IM SUSR
0.5000 mL | Freq: Once | INTRAMUSCULAR | 1 refills | Status: AC
Start: 2023-05-20 — End: 2023-05-20

## 2023-05-20 NOTE — Progress Notes (Signed)
Complete physical exam   Patient: [name]   DOB: [birth year]   62 y.o. Male  MRN: [id number] Visit Date: 05/20/2023  Today's healthcare provider: Jacky Kindle, FNP  Re Introduced to nurse practitioner role and practice setting.  All questions answered.  Discussed provider/patient relationship and expectations.  Chief Complaint  Patient presents with   Annual Exam   Subjective    [name] is a 62 y.o. male who presents today for a complete physical exam.   He does have additional problems to discuss today.   HPI   The patient, with a history of stroke, heart attack, and colon cancer, presents with increased stress levels due to work-related issues as well as CPE. The patient reports a stress level of "15 on a scale of 1 to 10" and is contemplating changing jobs due to the impact on his health. The patient also reports residual effects from the stroke and heart attack, including balance issues and difficulty standing for extended periods. The patient also reports a recent issue with sixth nerve palsy, which has since resolved. The patient admits to smoking when stressed but denies daily use. The patient also reports insomnia and requests a refill of Valium.  Past Medical History:  Diagnosis Date   Anemia 10/03/2009   Qualifier: Diagnosis of  By: Nena Jordan    Anxiety associated with depression 10/11/2013   Colon cancer [facility])    right colon cancer- adenocarcinoma CEA level isnrmal at 1.8   Diabetes mellitus    Diabetes mellitus type 2 in obese 03/20/2010   Qualifier: Diagnosis of  By: Nena Jordan     Erectile dysfunction 06/22/2016   Essential hypertension 06/05/2014   FATTY LIVER DISEASE 11/04/2009   Qualifier: Diagnosis of  By: Marina Goodell MD, Wilhemina Bonito   Qualifier: Diagnosis of  By: Marina Goodell MD, Wilhemina Bonito  Last Assessment & Plan:  conirmed by CT scan of abdomen in April 2016 encouraged to minimize simple carbs and fatty foods.   GERD 11/04/2009   Qualifier:  Diagnosis of  By: Marina Goodell MD, Wilhemina Bonito    Great toe pain, right 04/18/2017   Hepatic artery stenosis [facility])    History of colon cancer 01/02/2010   Qualifier: Diagnosis of  By: Nena Jordan Dr Marina Goodell  Last Assessment & Plan:  Follows closely with gastroenterology, Dr Marina Goodell   Hyperlipidemia    Hyperlipidemia, mixed 09/07/2010   Qualifier: Diagnosis of  By: Nena Jordan    Hypertension    Hypertriglyceridemia 12/14/2010   Hypotestosteronism 06/22/2016   INSOMNIA, CHRONIC 03/20/2010   Qualifier: Diagnosis of  By: Nena Jordan Ambien 10 mg daily does not keep asleep AdvilPM is over sedating and has trouble waking up    Iron deficiency anemia 2011   Low testosterone 06/22/2016   Malignant neoplasm of colon (HCC) 01/02/2010   Qualifier: Diagnosis of  By: Nena Jordan Dr Marina Goodell    Muscle spasm of back 06/05/2014   Nipple pain 09/30/2016   Obesity    Palpitations 06/05/2014   Preventative health care 10/08/2012   Psoriasis    Psoriatic arthritis (HCC) 08/25/2017   Right knee pain 11/17/2011   Stroke [facility])    Past Surgical History:  Procedure Laterality Date   BUBBLE STUDY  10/21/2020   Procedure: BUBBLE STUDY;  Surgeon: Chrystie Nose, MD;  Location: [facility] ENDOSCOPY;  Service: Cardiovascular;;   CARPAL TUNNEL RELEASE     RIGHT  CHOLECYSTECTOMY  1994   COLONOSCOPY     CORONARY/GRAFT ACUTE MI REVASCULARIZATION N/A 10/27/2019   Procedure: Coronary/Graft Acute MI Revascularization;  Surgeon: Iran Ouch, MD;  Location: [facility] INVASIVE CV LAB;  Service: Cardiovascular;  Laterality: N/A;   HEMICOLECTOMY     12/17/2009 right   LEFT HEART CATH AND CORONARY ANGIOGRAPHY N/A 10/27/2019   Procedure: LEFT HEART CATH AND CORONARY ANGIOGRAPHY;  Surgeon: Iran Ouch, MD;  Location: [facility] INVASIVE CV LAB;  Service: Cardiovascular;  Laterality: N/A;   LOOP RECORDER INSERTION N/A 10/21/2020   Procedure: LOOP RECORDER INSERTION;  Surgeon: Hillis Range, MD;  Location: [facility] INVASIVE CV LAB;   Service: Cardiovascular;  Laterality: N/A;   POLYPECTOMY     TEE WITHOUT CARDIOVERSION N/A 10/21/2020   Procedure: TRANSESOPHAGEAL ECHOCARDIOGRAM (TEE);  Surgeon: Chrystie Nose, MD;  Location: [facility] ENDOSCOPY;  Service: Cardiovascular;  Laterality: N/A;   Social History   Socioeconomic History   Marital status: Single    Spouse name: [name]   Number of children: 0   Years of education: Not on file   Highest education level: Associate degree: occupational, Scientist, product/process development, or vocational program  Occupational History   Occupation: Pensions consultant    Comment: printers and fax  Tobacco Use   Smoking status: Former    Current packs/day: 0.00    Types: Cigarettes    Quit date: 10/27/2019    Years since quitting: 3.5   Smokeless tobacco: Never  Vaping Use   Vaping status: Never Used  Substance and Sexual Activity   Alcohol use: No   Drug use: No   Sexual activity: Yes    Comment: lives with girlfriend, no dietary restrictions  Other Topics Concern   Not on file  Social History Narrative   Occupation: Pensions consultant (Conservator, museum/gallery)   Single  (lives with [place])   no children    former smoker   Illicit Drug Use - no    Social Determinants of Health   Financial Resource Strain: Low Risk  (05/18/2023)   Overall Financial Resource Strain (CARDIA)    Difficulty of Paying Living Expenses: Not hard at all  Food Insecurity: No Food Insecurity (05/18/2023)   Hunger Vital Sign    Worried About Running Out of Food in the Last Year: Never true    Ran Out of Food in the Last Year: Never true  Transportation Needs: No Transportation Needs (05/18/2023)   PRAPARE - Administrator, Civil Service (Medical): No    Lack of Transportation (Non-Medical): No  Physical Activity: Sufficiently Active (05/18/2023)   Exercise Vital Sign    Days of Exercise per Week: 5 days    Minutes of Exercise per Session: 30 min  Stress: No Stress Concern Present (05/18/2023)   [employer] of Occupational  Health - Occupational Stress Questionnaire    Feeling of Stress : Not at all  Social Connections: Socially Isolated (05/18/2023)   Social Connection and Isolation Panel [NHANES]    Frequency of Communication with Friends and Family: Once a week    Frequency of Social Gatherings with Friends and Family: Once a week    Attends Religious Services: Never    Database administrator or Organizations: No    Attends Engineer, structural: Not on file    Marital Status: Never married  Intimate Partner Violence: Not on file   Family Status  Relation Name Status   Mother  Deceased at age 53's       Stomach  Brother  Alive       67   Brother  Deceased       mva   Father 71 Deceased   Sister  Alive   MGM  Deceased   MGF  Deceased   PGM  Deceased   PGF  Deceased   Sister  Alive   Brother  Alive   Neg Hx  (Not Specified)  No partnership data on file   Family History  Problem Relation Age of Onset   Stomach cancer Mother        diedinher 29's   Diabetes type II Brother        boderline   Diabetes Brother        type II   Alcohol abuse Brother    Other Neg Hx        cad,prostate ca, colon ca   Coronary artery disease Neg Hx    Cancer Neg Hx        colon, prostate   Colon cancer Neg Hx    Esophageal cancer Neg Hx    Rectal cancer Neg Hx    Allergies  Allergen Reactions   Clopidogrel Anaphylaxis    "Weird" sensation in legs "Weird" sensation in legs    Patient Care Team: Jacky Kindle, FNP as PCP - General (Family Medicine) Iran Ouch, MD as PCP - Cardiology (Cardiology) Hillis Range, MD (Inactive) as PCP - Electrophysiology (Cardiology) myeyedr   Medications: Outpatient Medications Prior to Visit  Medication Sig   zolpidem (AMBIEN) 10 MG tablet Take 1 tablet (10 mg total) by mouth at bedtime as needed.   aspirin EC 81 MG tablet Take 1 tablet (81 mg total) by mouth daily.   Blood Glucose Monitoring Suppl (CONTOUR NEXT EZ) w/Device KIT See admin  instructions.   BRILINTA 60 MG TABS tablet TAKE 1 TABLET BY MOUTH TWICE DAILY   Continuous Blood Gluc Receiver (FREESTYLE LIBRE 14 DAY READER) DEVI 1 each by Does not apply route every 14 (fourteen) days.   Continuous Glucose Sensor (FREESTYLE LIBRE 2 SENSOR) MISC USE 1 SENSOR EVERY 14 DAYS   diazepam (VALIUM) 5 MG tablet Take 1 tablet (5 mg total) by mouth every 12 (twelve) hours.   glucose blood (CONTOUR NEXT TEST) test strip Use as directed to check blood sugar 3 times daily.   Insulin Glargine (BASAGLAR KWIKPEN) 100 UNIT/ML Inject 15 Units into the skin at bedtime. Titrate to Fasting Blood Sugar of 100- 150 mg/dL with morning blood sugar check.   Insulin lispro (HUMALOG JUNIOR KWIKPEN) 100 UNIT/ML Inject 5 Units into the skin 3 (three) times daily.   Insulin Pen Needle 32G X 8 MM MISC 1 each by Does not apply route at bedtime.   lisinopril (ZESTRIL) 20 MG tablet Take 1 tablet (20 mg total) by mouth daily.   melatonin 5 MG TABS Take 1 tablet (5 mg total) by mouth at bedtime.   metFORMIN (GLUCOPHAGE-XR) 750 MG 24 hr tablet Take 1 tablet (750 mg total) by mouth daily with breakfast.   methocarbamol (ROBAXIN) 500 MG tablet Take 1 tablet (500 mg total) by mouth 4 (four) times daily.   metoprolol succinate (TOPROL XL) 25 MG 24 hr tablet Take 1 tablet (25 mg total) by mouth once daily.   Multiple Vitamins-Minerals (MULTIVITAMIN ADULT, MINERALS,) TABS Take 1 tablet by mouth daily.   potassium chloride (KLOR-CON) 10 MEQ tablet Take 1 tablet (10 mEq total) by mouth daily.   rosuvastatin (CRESTOR) 20 MG tablet Take 1 tablet (  20 mg total) by mouth every evening.   Semaglutide, 2 MG/DOSE, 8 MG/3ML SOPN Inject 2 mg as directed once a week.   ticagrelor (BRILINTA) 60 MG TABS tablet Take 1 tablet (60 mg total) by mouth 2 (two) times daily.   Vitamin D, Ergocalciferol, (DRISDOL) 1.25 MG (50000 UNIT) CAPS capsule Take 1 capsule (50,000 Units total) by mouth once every 7 days.   No facility-administered  medications prior to visit.    Review of Systems Last CBC Lab Results  Component Value Date   WBC 12.3 (H) 04/05/2022   HGB 14.9 04/05/2022   HCT 42.9 04/05/2022   MCV 93 04/05/2022   MCH 32.1 04/05/2022   RDW 12.5 04/05/2022   PLT 308 04/05/2022   Last metabolic panel Lab Results  Component Value Date   GLUCOSE 175 (H) 03/17/2022   NA 137 03/17/2022   K 3.6 03/17/2022   CL 105 03/17/2022   CO2 25 03/17/2022   BUN 19 03/17/2022   CREATININE 0.87 03/17/2022   GFRNONAA >60 03/17/2022   CALCIUM 9.5 03/17/2022   PHOS 2.8 10/29/2014   PROT 8.0 03/17/2022   ALBUMIN 4.2 03/17/2022   LABGLOB 2.6 11/25/2021   AGRATIO 1.6 11/25/2021   BILITOT 2.0 (H) 03/17/2022   ALKPHOS 44 03/17/2022   AST 19 03/17/2022   ALT 15 03/17/2022   ANIONGAP 7 03/17/2022   Last lipids Lab Results  Component Value Date   CHOL 73 (L) 04/05/2022   HDL 38 (L) 04/05/2022   LDLCALC 12 04/05/2022   LDLDIRECT 76.0 03/13/2021   TRIG 134 04/05/2022   CHOLHDL 1.9 04/05/2022   Last hemoglobin A1c Lab Results  Component Value Date   HGBA1C 6.5 11/12/2022   Last thyroid functions Lab Results  Component Value Date   TSH 2.197 10/20/2020   Last vitamin D Lab Results  Component Value Date   VD25OH 42.1 04/05/2022    Objective    There were no vitals taken for this visit.  BP Readings from Last 3 Encounters:  05/20/23 (!) 111/57  11/12/22 139/79  05/21/22 119/73   Wt Readings from Last 3 Encounters:  05/20/23 216 lb 6.4 oz (98.2 kg)  11/12/22 223 lb (101.2 kg)  05/21/22 207 lb (93.9 kg)   Physical Exam Vitals and nursing note reviewed.  Constitutional:      General: He is awake. He is not in acute distress.    Appearance: Normal appearance. He is well-developed and well-groomed. He is obese. He is not ill-appearing, toxic-appearing or diaphoretic.  HENT:     Head: Normocephalic and atraumatic.     Jaw: There is normal jaw occlusion. No trismus, tenderness, swelling or pain on  movement.     Salivary Glands: Right salivary gland is not diffusely enlarged or tender. Left salivary gland is not diffusely enlarged or tender.     Right Ear: Hearing, tympanic membrane, ear canal and external ear normal. There is no impacted cerumen.     Left Ear: Hearing, tympanic membrane, ear canal and external ear normal. There is no impacted cerumen.     Nose: Nose normal. No congestion or rhinorrhea.     Right Turbinates: Not enlarged, swollen or pale.     Left Turbinates: Not enlarged, swollen or pale.     Right Sinus: No maxillary sinus tenderness or frontal sinus tenderness.     Left Sinus: No maxillary sinus tenderness or frontal sinus tenderness.     Mouth/Throat:     Lips: Pink.  Mouth: Mucous membranes are moist. No injury, lacerations, oral lesions or angioedema.     Pharynx: Oropharynx is clear. Uvula midline. No pharyngeal swelling, oropharyngeal exudate or posterior oropharyngeal erythema.     Tonsils: No tonsillar exudate or tonsillar abscesses.  Eyes:     General: Lids are normal. Vision grossly intact. Gaze aligned appropriately.        Right eye: No discharge.        Left eye: No discharge.     Extraocular Movements: Extraocular movements intact.     Conjunctiva/sclera: Conjunctivae normal.     Pupils: Pupils are equal, round, and reactive to light.  Neck:     Thyroid: No thyroid mass, thyromegaly or thyroid tenderness.     Vascular: No carotid bruit.     Trachea: Trachea normal. No tracheal tenderness.  Cardiovascular:     Rate and Rhythm: Normal rate and regular rhythm.     Pulses: Normal pulses.          Carotid pulses are 2+ on the right side and 2+ on the left side.      Radial pulses are 2+ on the right side and 2+ on the left side.       Femoral pulses are 2+ on the right side and 2+ on the left side.      Popliteal pulses are 2+ on the right side and 2+ on the left side.       Dorsalis pedis pulses are 2+ on the right side and 2+ on the left side.        Posterior tibial pulses are 2+ on the right side and 2+ on the left side.     Heart sounds: Normal heart sounds, S1 normal and S2 normal. No murmur heard.    No friction rub. No gallop.  Pulmonary:     Effort: Pulmonary effort is normal. No respiratory distress.     Breath sounds: Normal breath sounds and air entry. No stridor. No wheezing, rhonchi or rales.  Chest:     Chest wall: No tenderness.  Abdominal:     General: Abdomen is flat. Bowel sounds are normal. There is no distension.     Palpations: Abdomen is soft. There is no mass.     Tenderness: There is no abdominal tenderness. There is no guarding or rebound.     Hernia: No hernia is present.  Genitourinary:    Comments: Exam deferred; denies complaints Musculoskeletal:        General: No swelling, tenderness, deformity or signs of injury. Normal range of motion.     Cervical back: Normal range of motion and neck supple. No rigidity or tenderness.     Right lower leg: No edema.     Left lower leg: No edema.  Lymphadenopathy:     Cervical: No cervical adenopathy.     Right cervical: No superficial, deep or posterior cervical adenopathy.    Left cervical: No superficial, deep or posterior cervical adenopathy.  Skin:    General: Skin is warm and dry.     Capillary Refill: Capillary refill takes less than 2 seconds.     Coloration: Skin is not jaundiced or pale.     Findings: No bruising, erythema, lesion or rash.  Neurological:     General: No focal deficit present.     Mental Status: He is alert and oriented to person, place, and time. Mental status is at baseline.     GCS: GCS eye subscore is 4. GCS verbal  subscore is 5. GCS motor subscore is 6.     Sensory: Sensation is intact. No sensory deficit.     Motor: Motor function is intact. No weakness.     Coordination: Coordination is intact.     Gait: Gait is intact.  Psychiatric:        Attention and Perception: Attention and perception normal.        Mood and Affect:  Mood and affect normal.        Speech: Speech normal.        Behavior: Behavior normal. Behavior is cooperative.        Thought Content: Thought content normal.        Cognition and Memory: Cognition normal.        Judgment: Judgment normal.     Last depression screening scores    11/12/2022    9:32 AM 05/21/2022    9:39 AM 04/05/2022    8:14 AM  PHQ 2/9 Scores  PHQ - 2 Score 0 0 0  PHQ- 9 Score 0 1 0   Last fall risk screening    11/12/2022    9:32 AM  Fall Risk   Falls in the past year? 0  Number falls in past yr: 0  Injury with Fall? 0  Risk for fall due to : No Fall Risks   Last Audit-C alcohol use screening    05/18/2023    8:26 PM  Alcohol Use Disorder Test (AUDIT)  1. How often do you have a drink containing alcohol? 0  2. How many drinks containing alcohol do you have on a typical day when you are drinking? 0  3. How often do you have six or more drinks on one occasion? 0  AUDIT-C Score 0   A score of 3 or more in women, and 4 or more in men indicates increased risk for alcohol abuse, EXCEPT if all of the points are from question 1   No results found for any visits on 05/20/23.  Assessment & Plan    Routine Health Maintenance and Physical Exam  Exercise Activities and Dietary recommendations  Goals   None     Immunization History  Administered Date(s) Administered   Influenza Whole 03/20/2010   Influenza,inj,Quad PF,6+ Mos 05/01/2015, 06/22/2016, 04/18/2017, 05/04/2018   Pneumococcal Conjugate-13 04/18/2017   Tdap 05/01/2015    Health Maintenance  Topic Date Due   Zoster Vaccines- Shingrix (1 of 2) Never done   Colonoscopy  01/14/2022   Diabetic kidney evaluation - Urine ACR  11/26/2022   INFLUENZA VACCINE  02/17/2023   Diabetic kidney evaluation - eGFR measurement  03/18/2023   HEMOGLOBIN A1C  05/14/2023   OPHTHALMOLOGY EXAM  05/21/2023   FOOT EXAM  05/22/2023   DTaP/Tdap/Td (2 - Td or Tdap) 04/30/2025   Hepatitis C Screening  Completed    HIV Screening  Completed   HPV VACCINES  Aged Out   COVID-19 Vaccine  Discontinued    Discussed health benefits of physical activity, and encouraged him to engage in regular exercise appropriate for his age and condition.  Problem List Items Addressed This Visit       Cardiovascular and Mediastinum   Chronic systolic heart failure (HCC)   Coronary artery disease involving native coronary artery   Hypertension associated with diabetes (HCC)   Relevant Medications   insulin lispro (HUMALOG) 100 UNIT/ML KwikPen   Other Relevant Orders   CBC with Differential/Platelet   Comprehensive metabolic panel   TSH  Digestive   Esophageal reflux     Endocrine   Diabetes mellitus (HCC)   Relevant Medications   insulin lispro (HUMALOG) 100 UNIT/ML KwikPen   Other Relevant Orders   CBC with Differential/Platelet   Comprehensive metabolic panel   Lipid Panel With LDL/HDL Ratio   Hemoglobin A1c   Urine microalbumin-creatinine with uACR   Hyperlipidemia associated with type 2 diabetes mellitus (HCC)   Relevant Medications   insulin lispro (HUMALOG) 100 UNIT/ML KwikPen   Other Relevant Orders   Lipid Panel With LDL/HDL Ratio     Musculoskeletal and Integument   Psoriatic arthritis (HCC)   Relevant Medications   SIMPONI 50 MG/0.5ML SOAJ     Other   Annual physical exam - Primary   Relevant Orders   CBC with Differential/Platelet   Comprehensive metabolic panel   Lipid Panel With LDL/HDL Ratio   TSH   PSA   History of colon cancer   History of ST elevation myocardial infarction (STEMI)   History of stroke   History of vitamin D deficiency   Relevant Orders   Vitamin D (25 hydroxy)   Psychophysiological insomnia   Screen for colon cancer   Relevant Orders   Cologuard  Stress and Anxiety High stress levels reported due to work, with a history of stroke and heart attack. Patient requested Valium (Diazepam) for stress management. Discussed the potential placebo effect and the  risks/benefits of benzodiazepines. Previously on 5 mg BID; last fill 4/23 -Start low dose Valium as needed for stress. -Trial of Seroquel to help with stress and anxiety.  Insomnia Patient currently on max dose of Ambien (10mg ) and still experiencing difficulty sleeping. Discussed the risks/benefits of adding an additional medication. -Continue Ambien 10mg  at night. -trial of seroquel 100 mg to assist.   Post-Stroke and Post-Heart Attack Management Patient reports difficulty with balance and physical tasks since the stroke and heart attack. Discussed the importance of consistent medication use, stress management, and lifestyle modifications. -Continue current medications and monitor blood pressure and cholesterol levels. -Encourage stress management techniques and avoidance of smoking.  General Health Maintenance -Discussed the benefits of the Shingrix vaccine for shingles prevention and the RSV vaccine. Patient to consider these vaccines. -Order labs today to monitor current health status. -Schedule follow-up appointment in three months.    Leilani Merl, FNP, have reviewed all documentation for this visit. The documentation on 05/20/23 for the exam, diagnosis, procedures, and orders are all accurate and complete.  Jacky Kindle, FNP  [facility] [phone] (phone) [phone] (fax)  [facility]

## 2023-05-20 NOTE — Patient Instructions (Signed)
The CDC recommends two doses of Shingrix (the new shingles vaccine) separated by 2 to 6 months for adults age 60 years and older. I recommend checking with your insurance plan regarding coverage for this vaccine.    

## 2023-05-22 LAB — HEMOGLOBIN A1C
Est. average glucose Bld gHb Est-mCnc: 154 mg/dL
Hgb A1c MFr Bld: 7 % — ABNORMAL HIGH (ref 4.8–5.6)

## 2023-05-22 LAB — CBC WITH DIFFERENTIAL/PLATELET
Basophils Absolute: 0.1 10*3/uL (ref 0.0–0.2)
Basos: 1 %
EOS (ABSOLUTE): 0.2 10*3/uL (ref 0.0–0.4)
Eos: 2 %
Hematocrit: 46.7 % (ref 37.5–51.0)
Hemoglobin: 15.8 g/dL (ref 13.0–17.7)
Immature Grans (Abs): 0 10*3/uL (ref 0.0–0.1)
Immature Granulocytes: 0 %
Lymphocytes Absolute: 2.7 10*3/uL (ref 0.7–3.1)
Lymphs: 25 %
MCH: 31.9 pg (ref 26.6–33.0)
MCHC: 33.8 g/dL (ref 31.5–35.7)
MCV: 94 fL (ref 79–97)
Monocytes Absolute: 1.1 10*3/uL — ABNORMAL HIGH (ref 0.1–0.9)
Monocytes: 10 %
Neutrophils Absolute: 6.6 10*3/uL (ref 1.4–7.0)
Neutrophils: 62 %
Platelets: 313 10*3/uL (ref 150–450)
RBC: 4.96 x10E6/uL (ref 4.14–5.80)
RDW: 11.3 % — ABNORMAL LOW (ref 11.6–15.4)
WBC: 10.7 10*3/uL (ref 3.4–10.8)

## 2023-05-22 LAB — LIPID PANEL WITH LDL/HDL RATIO
Cholesterol, Total: 111 mg/dL (ref 100–199)
HDL: 39 mg/dL — ABNORMAL LOW (ref >39)
LDL Chol Calc (NIH): 49 mg/dL (ref 0–99)
LDL/HDL Ratio: 1.3 ratio (ref 0.0–3.6)
Triglycerides: 130 mg/dL (ref 0–149)
VLDL Cholesterol Cal: 23 mg/dL (ref 5–40)

## 2023-05-22 LAB — MICROALBUMIN / CREATININE URINE RATIO
Creatinine, Urine: 169.7 mg/dL
Microalb/Creat Ratio: 6 mg/g{creat} (ref 0–29)
Microalbumin, Urine: 9.5 ug/mL

## 2023-05-22 LAB — COMPREHENSIVE METABOLIC PANEL
ALT: 15 IU/L (ref 0–44)
AST: 19 IU/L (ref 0–40)
Albumin: 4.7 g/dL (ref 3.8–4.9)
Alkaline Phosphatase: 65 IU/L (ref 44–121)
BUN/Creatinine Ratio: 20 (ref 10–24)
BUN: 22 mg/dL (ref 8–27)
Bilirubin Total: 1.3 mg/dL — ABNORMAL HIGH (ref 0.0–1.2)
CO2: 16 mmol/L — ABNORMAL LOW (ref 20–29)
Calcium: 9.7 mg/dL (ref 8.6–10.2)
Chloride: 103 mmol/L (ref 96–106)
Creatinine, Ser: 1.09 mg/dL (ref 0.76–1.27)
Globulin, Total: 2.7 g/dL (ref 1.5–4.5)
Glucose: 126 mg/dL — ABNORMAL HIGH (ref 70–99)
Potassium: 4.4 mmol/L (ref 3.5–5.2)
Sodium: 135 mmol/L (ref 134–144)
Total Protein: 7.4 g/dL (ref 6.0–8.5)
eGFR: 78 mL/min/{1.73_m2} (ref >59)

## 2023-05-22 LAB — PSA: Prostate Specific Ag, Serum: 0.3 ng/mL (ref 0.0–4.0)

## 2023-05-22 LAB — VITAMIN D 25 HYDROXY (VIT D DEFICIENCY, FRACTURES): Vit D, 25-Hydroxy: 36.7 ng/mL (ref 30.0–100.0)

## 2023-05-22 LAB — TSH: TSH: 3.08 u[IU]/mL (ref 0.450–4.500)

## 2023-05-27 ENCOUNTER — Ambulatory Visit: Payer: BC Managed Care – PPO | Admitting: Cardiovascular Disease

## 2023-05-27 ENCOUNTER — Telehealth: Payer: Self-pay | Admitting: Pharmacist

## 2023-05-27 NOTE — Telephone Encounter (Signed)
[facility] pharmacy is requesting prescription refill glucose blood (CONTOUR NEXT TEST) test strip  Please advise

## 2023-05-27 NOTE — Telephone Encounter (Signed)
Prescription for this was sent in to [facility] 05/20/2023. Per pharmacist this cannot be process until we can give specific instructions how often patient needs to checked.  They cannot take 'Sig: Use as instructed "

## 2023-05-30 ENCOUNTER — Ambulatory Visit (INDEPENDENT_AMBULATORY_CARE_PROVIDER_SITE_OTHER): Payer: BC Managed Care – PPO

## 2023-05-30 DIAGNOSIS — I639 Cerebral infarction, unspecified: Secondary | ICD-10-CM | POA: Diagnosis not present

## 2023-05-31 LAB — CUP PACEART REMOTE DEVICE CHECK
Date Time Interrogation Session: 20241110230736
Implantable Pulse Generator Implant Date: 20220405

## 2023-06-01 ENCOUNTER — Other Ambulatory Visit: Payer: Self-pay | Admitting: Family Medicine

## 2023-06-01 DIAGNOSIS — Z794 Long term (current) use of insulin: Secondary | ICD-10-CM

## 2023-06-01 MED ORDER — CONTOUR NEXT TEST VI STRP: 1.0000 | ORAL_STRIP | Freq: Three times a day (TID) | 12 refills | Status: AC

## 2023-06-24 ENCOUNTER — Ambulatory Visit: Payer: BC Managed Care – PPO | Attending: Cardiovascular Disease | Admitting: Cardiovascular Disease

## 2023-06-24 ENCOUNTER — Encounter: Payer: Self-pay | Admitting: Cardiovascular Disease

## 2023-06-24 VITALS — BP 126/62 | HR 88 | Ht 68.0 in | Wt 225.4 lb

## 2023-06-24 DIAGNOSIS — E785 Hyperlipidemia, unspecified: Secondary | ICD-10-CM | POA: Diagnosis not present

## 2023-06-24 DIAGNOSIS — I5022 Chronic systolic (congestive) heart failure: Secondary | ICD-10-CM | POA: Diagnosis not present

## 2023-06-24 DIAGNOSIS — I10 Essential (primary) hypertension: Secondary | ICD-10-CM

## 2023-06-24 DIAGNOSIS — I25118 Atherosclerotic heart disease of native coronary artery with other forms of angina pectoris: Secondary | ICD-10-CM

## 2023-06-24 DIAGNOSIS — R072 Precordial pain: Secondary | ICD-10-CM | POA: Diagnosis not present

## 2023-06-24 NOTE — Progress Notes (Signed)
Cardiology Office Note   Date:  06/24/2023   ID:  [name], DOB 1960/11/02, MRN [id number]  PCP:  Jacky Kindle, FNP  Cardiologist:   Lorine Bears, MD   Chief Complaint  Patient presents with   6 month follow up     "Doing well." Medications reviewed by the patient verbally.       History of Present Illness: [name] is a 62 y.o. male who presents for for follow-up visit regarding coronary artery disease and chronic systolic heart failure due to ischemic cardiomyopathy.  He has known history of diabetes mellitus, essential hypertension, CVA ,hyperlipidemia and tobacco use. He presented in April of 2021 with anterior ST elevation myocardial infarction.  Emergent cardiac catheterization showed occluded proximal LAD which was treated successfully with PCI and drug-eluting stent placement.  Echocardiogram showed an EF of 35 to 40%.  He did not tolerate prasugrel and was switched to clopidogrel with subsequent improvement in symptoms  He quit smoking since his cardiac event.  During last visit he was switched from carvedilol to Toprol due to persistent tachycardia and shortness of breath.  He had myalgia with atorvastatin but improved after switching to rosuvastatin.  He had compliance issues.  He was hospitalized in April 2022 with acute CVA in the setting of noncompliance with antihypertensive medications.  Imaging showed left posterior thalamic punctuate infarct.  He had a loop recorder inserted.  Transesophageal echocardiogram showed no cardiac source of embolism.  EF was 55 to 60%.  He reports worsening exertional dyspnea.  In addition, he reports episodes of palpitations and chest discomfort especially at night.  The symptoms are similar to what he had before his myocardial infarction in 2021.  He has been under increased stress and he resumed smoking but very small amounts overall.  1 pack lasts him 3 weeks.  Past Medical History:  Diagnosis Date   Anemia 10/03/2009    Qualifier: Diagnosis of  By: Nena Jordan    Anxiety associated with depression 10/11/2013   Colon cancer [facility])    right colon cancer- adenocarcinoma CEA level isnrmal at 1.8   Diabetes mellitus    Diabetes mellitus type 2 in obese 03/20/2010   Qualifier: Diagnosis of  By: Nena Jordan     Erectile dysfunction 06/22/2016   Essential hypertension 06/05/2014   FATTY LIVER DISEASE 11/04/2009   Qualifier: Diagnosis of  By: Marina Goodell MD, Wilhemina Bonito   Qualifier: Diagnosis of  By: Marina Goodell MD, Wilhemina Bonito  Last Assessment & Plan:  conirmed by CT scan of abdomen in April 2016 encouraged to minimize simple carbs and fatty foods.   GERD 11/04/2009   Qualifier: Diagnosis of  By: Marina Goodell MD, Wilhemina Bonito    Great toe pain, right 04/18/2017   Hepatic artery stenosis [facility])    History of colon cancer 01/02/2010   Qualifier: Diagnosis of  By: Nena Jordan Dr Marina Goodell  Last Assessment & Plan:  Follows closely with gastroenterology, Dr Marina Goodell   Hyperlipidemia    Hyperlipidemia, mixed 09/07/2010   Qualifier: Diagnosis of  By: Nena Jordan    Hypertension    Hypertriglyceridemia 12/14/2010   Hypotestosteronism 06/22/2016   INSOMNIA, CHRONIC 03/20/2010   Qualifier: Diagnosis of  By: Nena Jordan Ambien 10 mg daily does not keep asleep AdvilPM is over sedating and has trouble waking up    Iron deficiency anemia 2011   Low testosterone 06/22/2016   Malignant neoplasm of colon (HCC) 01/02/2010  Qualifier: Diagnosis of  By: Nena Jordan Dr Marina Goodell    Muscle spasm of back 06/05/2014   Nipple pain 09/30/2016   Obesity    Palpitations 06/05/2014   Preventative health care 10/08/2012   Psoriasis    Psoriatic arthritis (HCC) 08/25/2017   Right knee pain 11/17/2011   Stroke [facility])     Past Surgical History:  Procedure Laterality Date   BUBBLE STUDY  10/21/2020   Procedure: BUBBLE STUDY;  Surgeon: Chrystie Nose, MD;  Location: [facility] ENDOSCOPY;  Service: Cardiovascular;;   CARPAL TUNNEL RELEASE      RIGHT   CHOLECYSTECTOMY  1994   COLONOSCOPY     CORONARY/GRAFT ACUTE MI REVASCULARIZATION N/A 10/27/2019   Procedure: Coronary/Graft Acute MI Revascularization;  Surgeon: Iran Ouch, MD;  Location: [facility] INVASIVE CV LAB;  Service: Cardiovascular;  Laterality: N/A;   HEMICOLECTOMY     12/17/2009 right   LEFT HEART CATH AND CORONARY ANGIOGRAPHY N/A 10/27/2019   Procedure: LEFT HEART CATH AND CORONARY ANGIOGRAPHY;  Surgeon: Iran Ouch, MD;  Location: [facility] INVASIVE CV LAB;  Service: Cardiovascular;  Laterality: N/A;   LOOP RECORDER INSERTION N/A 10/21/2020   Procedure: LOOP RECORDER INSERTION;  Surgeon: Hillis Range, MD;  Location: [facility] INVASIVE CV LAB;  Service: Cardiovascular;  Laterality: N/A;   POLYPECTOMY     TEE WITHOUT CARDIOVERSION N/A 10/21/2020   Procedure: TRANSESOPHAGEAL ECHOCARDIOGRAM (TEE);  Surgeon: Chrystie Nose, MD;  Location: [facility] ENDOSCOPY;  Service: Cardiovascular;  Laterality: N/A;     Current Outpatient Medications  Medication Sig Dispense Refill   diazepam (VALIUM) 2 MG tablet Take 1 tablet (2 mg total) by mouth daily as needed for anxiety. 30 tablet 0   glucose blood (CONTOUR NEXT TEST) test strip 1 each by Other route 4 (four) times daily - after meals and at bedtime. Use as instructed 100 each 12   Insulin lispro (HUMALOG JUNIOR KWIKPEN) 100 UNIT/ML Inject 5 Units into the skin 3 (three) times daily. 15 mL 0   insulin lispro (HUMALOG) 100 UNIT/ML KwikPen Inject 10 Units into the skin 3 (three) times daily. As directed 15 mL 5   lisinopril (ZESTRIL) 20 MG tablet Take 1 tablet (20 mg total) by mouth daily. 90 tablet 3   metFORMIN (GLUCOPHAGE-XR) 750 MG 24 hr tablet Take 1 tablet (750 mg total) by mouth 2 (two) times daily after a meal. 180 tablet 3   metoprolol succinate (TOPROL XL) 25 MG 24 hr tablet Take 1 tablet (25 mg total) by mouth once daily. 90 tablet 3   QUEtiapine (SEROQUEL) 100 MG tablet Take 1 tablet (100 mg total) by mouth at bedtime. 30 tablet 5    rosuvastatin (CRESTOR) 20 MG tablet Take 1 tablet (20 mg total) by mouth every evening. 90 tablet 3   Semaglutide, 2 MG/DOSE, 8 MG/3ML SOPN Inject 2 mg as directed once a week. 3 mL 11   SIMPONI 50 MG/0.5ML SOAJ Inject 0.5 mg into the skin as directed.     ticagrelor (BRILINTA) 60 MG TABS tablet Take 60 mg by mouth 2 (two) times daily.     zolpidem (AMBIEN) 10 MG tablet Take 1 tablet (10 mg total) by mouth at bedtime as needed. 30 tablet 5   Insulin Glargine (BASAGLAR KWIKPEN) 100 UNIT/ML Inject 15 Units into the skin at bedtime. Titrate to Fasting Blood Sugar of 100- 150 mg/dL with morning blood sugar check. 15 mL 5   No current facility-administered medications for this visit.    Allergies:  Clopidogrel    Social History:  The patient  reports that he has been smoking cigarettes. He has never used smokeless tobacco. He reports that he does not drink alcohol and does not use drugs.   Family History:  The patient's family history includes Alcohol abuse in his brother; Diabetes in his brother; Diabetes type II in his brother; Stomach cancer in his mother.    ROS:  Please see the history of present illness.   Otherwise, review of systems are positive for none.   All other systems are reviewed and negative.    PHYSICAL EXAM: VS:  BP 126/62 (BP Location: Left Arm, Patient Position: Sitting, Cuff Size: Normal)   Pulse 88   Ht 5\' 8"  (1.727 m)   Wt 225 lb 6 oz (102.2 kg)   SpO2 98%   BMI 34.27 kg/m  , BMI Body mass index is 34.27 kg/m. GEN: Well nourished, well developed, in no acute distress  HEENT: normal  Neck: no JVD, carotid bruits, or masses Cardiac: RRR; no murmurs, rubs, or gallops,no edema  Respiratory:  clear to auscultation bilaterally, normal work of breathing GI: soft, nontender, nondistended, + BS MS: no deformity or atrophy  Skin: warm and dry, no rash Neuro:  Strength and sensation are intact Psych: euthymic mood, full affect    EKG:  EKG is ordered today. The  ekg ordered today demonstrates: Normal sinus rhythm Low voltage QRS Septal infarct (cited on or before 19-Oct-2020) When compared with ECG of 17-Mar-2022 16:52, QRS axis Shifted left Criteria for Lateral infarct are no longer Present Nonspecific T wave abnormality no longer evident in Inferior leads    Recent Labs: 05/20/2023: ALT 15; BUN 22; Creatinine, Ser 1.09; Hemoglobin 15.8; Platelets 313; Potassium 4.4; Sodium 135; TSH 3.080    Lipid Panel    Component Value Date/Time   CHOL 111 05/20/2023 1040   CHOL 949 (H) 10/27/2014 1322   TRIG 130 05/20/2023 1040   TRIG 1,098 (H) 10/30/2014 0605   HDL 39 (L) 05/20/2023 1040   HDL 20 (L) 10/27/2014 1322   CHOLHDL 1.9 04/05/2022 0848   CHOLHDL 5 03/13/2021 1317   VLDL 51 (H) 10/19/2020 0116   VLDL SEE COMMENT 10/27/2014 1322   LDLCALC 49 05/20/2023 1040   LDLCALC SEE COMMENT 10/27/2014 1322   LDLDIRECT 76.0 03/13/2021 1317      Wt Readings from Last 3 Encounters:  06/24/23 225 lb 6 oz (102.2 kg)  05/20/23 216 lb 6.4 oz (98.2 kg)  11/12/22 223 lb (101.2 kg)           12/07/2017    8:16 AM  PAD Screen  Previous PAD dx? No  Previous surgical procedure? No  Pain with walking? No  Feet/toe relief with dangling? Yes  Painful, non-healing ulcers? No  Extremities discolored? No      ASSESSMENT AND PLAN:  1.  Coronary artery disease involving native coronary arteries with other forms of angina: He reports worsening exertional dyspnea and vague chest discomfort and nighttime palpitations that is very similar to the symptoms that preceded his myocardial infarction.  EKG with no ischemic changes.  I recommend evaluation with a treadmill Myoview.   He continues to be on long-term ticagrelor 60 mg twice daily.  2.  Chronic systolic heart failure due to ischemic cardiomyopathy: Ejection fraction was 35 to 40% but improved to normal on most recent echocardiogram.  Continue Toprol and lisinopril.  He appears to be euvolemic without  any diuretic.  3.  Essential hypertension:  Blood pressure is controlled.  4.  Hyperlipidemia: Continue treatment with rosuvastatin.  Recent lipid profile showed an LDL of 49.    Disposition:   FU with me in 6 months.  Signed,  Lorine Bears, MD  06/24/2023 11:27 AM    [facility]

## 2023-06-24 NOTE — Patient Instructions (Addendum)
Medication Instructions:  No changes *If you need a refill on your cardiac medications before your next appointment, please call your pharmacy*   Lab Work: None ordered If you have labs (blood work) drawn today and your tests are completely normal, you will receive your results only by: MyChart Message (if you have MyChart) OR A paper copy in the mail If you have any lab test that is abnormal or we need to change your treatment, we will call you to review the results.   Testing/Procedures: Your provider has ordered a  Exercise Myoview Stress test. This will take place at [facility]. Please report to the [facility] medical mall entrance. The volunteers at the first desk will direct you where to go.  [facility] MYOVIEW  Your provider has ordered a Stress Test with nuclear imaging. The purpose of this test is to evaluate the blood supply to your heart muscle. This procedure is referred to as a "Non-Invasive Stress Test." This is because other than having an IV started in your vein, nothing is inserted or "invades" your body. Cardiac stress tests are done to find areas of poor blood flow to the heart by determining the extent of coronary artery disease (CAD). Some patients exercise on a treadmill, which naturally increases the blood flow to your heart, while others who are unable to walk on a treadmill due to physical limitations will have a pharmacologic/chemical stress agent called Lexiscan . This medicine will mimic walking on a treadmill by temporarily increasing your coronary blood flow.   Please note: these test may take anywhere between 2-4 hours to complete  How to prepare for your Myoview test:  Nothing to eat for 6 hours prior to the test No caffeine for 24 hours prior to test No smoking 24 hours prior to test. Your medication may be taken with water.  If your doctor stopped a medication because of this test, do not take that medication.  -hold the Metoprolol the morning of the test Ladies, please do  not wear dresses.  Skirts or pants are appropriate. Please wear a short sleeve shirt. No perfume, cologne or lotion. Wear comfortable walking shoes. No heels!   PLEASE NOTIFY THE OFFICE AT LEAST 24 HOURS IN ADVANCE IF YOU ARE UNABLE TO KEEP YOUR APPOINTMENT.  [phone] AND  PLEASE NOTIFY NUCLEAR MEDICINE AT [facility] AT LEAST 24 HOURS IN ADVANCE IF YOU ARE UNABLE TO KEEP YOUR APPOINTMENT. [phone]    Follow-Up: At [facility], you and your health needs are our priority.  As part of our continuing mission to provide you with exceptional heart care, we have created designated Provider Care Teams.  These Care Teams include your primary Cardiologist (physician) and Advanced Practice Providers (APPs -  Physician Assistants and Nurse Practitioners) who all work together to provide you with the care you need, when you need it.  We recommend signing up for the patient portal called "MyChart".  Sign up information is provided on this After Visit Summary.  MyChart is used to connect with patients for Virtual Visits (Telemedicine).  Patients are able to view lab/test results, encounter notes, upcoming appointments, etc.  Non-urgent messages can be sent to your provider as well.   To learn more about what you can do with MyChart, go to [email or web address].    Your next appointment:   6 month(s)  Provider:   You may see Lorine Bears, MD or one of the following Advanced Practice Providers on your designated Care Team:   Nicolasa Ducking, NP  [name], PA-C Cadence Furth, PA-C Charlsie Quest, NP Carlos Levering, NP    Managing the Challenge of Quitting Smoking Quitting smoking is a physical and mental challenge. You may have cravings, withdrawal symptoms, and temptation to smoke. Before quitting, work with your health care provider to make a plan that can help you manage quitting. Making a plan before you quit may keep you from smoking when you have the urge to smoke while trying  to quit. How to manage lifestyle changes Managing stress Stress can make you want to smoke, and wanting to smoke may cause stress. It is important to find ways to manage your stress. You could try some of the following: Practice relaxation techniques. Breathe slowly and deeply, in through your nose and out through your mouth. Listen to music. Soak in a bath or take a shower. Imagine a peaceful place or vacation. Get some support. Talk with family or friends about your stress. Join a support group. Talk with a counselor or therapist. Get some physical activity. Go for a walk, run, or bike ride. Play a favorite sport. Practice yoga.  Medicines Talk with your health care provider about medicines that might help you deal with cravings and make quitting easier for you. Relationships Social situations can be difficult when you are quitting smoking. To manage this, you can: Avoid parties and other social situations where people might be smoking. Avoid alcohol. Leave right away if you have the urge to smoke. Explain to your family and friends that you are quitting smoking. Ask for support and let them know you might be a bit grumpy. Plan activities where smoking is not an option. General instructions Be aware that many people gain weight after they quit smoking. However, not everyone does. To keep from gaining weight, have a plan in place before you quit, and stick to the plan after you quit. Your plan should include: Eating healthy snacks. When you have a craving, it may help to: Eat popcorn, or try carrots, celery, or other cut vegetables. Chew sugar-free gum. Changing how you eat. Eat small portion sizes at meals. Eat 4-6 small meals throughout the day instead of 1-2 large meals a day. Be mindful when you eat. You should avoid watching television or doing other things that might distract you as you eat. Exercising regularly. Make time to exercise each day. If you do not have time for  a long workout, do short bouts of exercise for 5-10 minutes several times a day. Do some form of strengthening exercise, such as weight lifting. Do some exercise that gets your heart beating and causes you to breathe deeply, such as walking fast, running, swimming, or biking. This is very important. Drinking plenty of water or other low-calorie or no-calorie drinks. Drink enough fluid to keep your urine pale yellow.  How to recognize withdrawal symptoms Your body and mind may experience discomfort as you try to get used to not having nicotine in your system. These effects are called withdrawal symptoms. They may include: Feeling hungrier than normal. Having trouble concentrating. Feeling irritable or restless. Having trouble sleeping. Feeling depressed. Craving a cigarette. These symptoms may surprise you, but they are normal to have when quitting smoking. To manage withdrawal symptoms: Avoid places, people, and activities that trigger your cravings. Remember why you want to quit. Get plenty of sleep. Avoid coffee and other drinks that contain caffeine. These may worsen some of your symptoms. How to manage cravings Come up with a plan for how to  deal with your cravings. The plan should include the following: A definition of the specific situation you want to deal with. An activity or action you will take to replace smoking. A clear idea for how this action will help. The name of someone who could help you with this. Cravings usually last for 5-10 minutes. Consider taking the following actions to help you with your plan to deal with cravings: Keep your mouth busy. Chew sugar-free gum. Suck on hard candies or a straw. Brush your teeth. Keep your hands and body busy. Change to a different activity right away. Squeeze or play with a ball. Do an activity or a hobby, such as making bead jewelry, practicing needlepoint, or working with wood. Mix up your normal routine. Take a short  exercise break. Go for a quick walk, or run up and down stairs. Focus on doing something kind or helpful for someone else. Call a friend or family member to talk during a craving. Join a support group. Contact a quitline. Where to find support To get help or find a support group: Call the National Cancer Institute's Smoking Quitline: 1-800-QUIT-NOW 914-716-5182) Text QUIT to SmokefreeTXT: 027253 Where to find more information Visit these websites to find more information on quitting smoking: U.S. Department of Health and Human Services: www.smokefree.gov American Lung Association: www.freedomfromsmoking.org Centers for Disease Control and Prevention (CDC): FootballExhibition.com.br American Heart Association: www.heart.org Contact a health care provider if: You want to change your plan for quitting. The medicines you are taking are not helping. Your eating feels out of control or you cannot sleep. You feel depressed or become very anxious. Summary Quitting smoking is a physical and mental challenge. You will face cravings, withdrawal symptoms, and temptation to smoke again. Preparation can help you as you go through these challenges. Try different techniques to manage stress, handle social situations, and prevent weight gain. You can deal with cravings by keeping your mouth busy (such as by chewing gum), keeping your hands and body busy, calling family or friends, or contacting a quitline for people who want to quit smoking. You can deal with withdrawal symptoms by avoiding places where people smoke, getting plenty of rest, and avoiding drinks that contain caffeine. This information is not intended to replace advice given to you by your health care provider. Make sure you discuss any questions you have with your health care provider. Document Revised: 06/26/2021 Document Reviewed: 06/26/2021 Elsevier Patient Education  2024 [employer].

## 2023-06-24 NOTE — Progress Notes (Signed)
Carelink Summary Report / Loop Recorder 

## 2023-07-01 ENCOUNTER — Ambulatory Visit
Admission: RE | Admit: 2023-07-01 | Discharge: 2023-07-01 | Disposition: A | Discharge status: Home / Self Care | Payer: BC Managed Care – PPO | Source: Ambulatory Visit | Attending: Cardiovascular Disease | Admitting: Cardiovascular Disease

## 2023-07-01 DIAGNOSIS — R9439 Abnormal result of other cardiovascular function study: Secondary | ICD-10-CM | POA: Diagnosis not present

## 2023-07-01 DIAGNOSIS — I5020 Unspecified systolic (congestive) heart failure: Secondary | ICD-10-CM | POA: Diagnosis not present

## 2023-07-01 DIAGNOSIS — R072 Precordial pain: Secondary | ICD-10-CM | POA: Diagnosis not present

## 2023-07-01 DIAGNOSIS — I700 Atherosclerosis of aorta: Secondary | ICD-10-CM | POA: Diagnosis not present

## 2023-07-01 LAB — NM MYOCAR MULTI W/SPECT W/WALL MOTION / EF
Angina Index: 0
Duke Treadmill Score: 7
Estimated workload: 8.3
Exercise duration (min): 7 min
Exercise duration (sec): 10 s
LV dias vol: 94 mL (ref 62–150)
LV sys vol: 46 mL
MPHR: 160 {beats}/min
Nuc Stress EF: 51 %
Peak HR: 148 {beats}/min
Percent HR: 92 %
Rest HR: 84 {beats}/min
Rest Nuclear Isotope Dose: 10.6 mCi
SDS: 0
SRS: 24
SSS: 16
ST Depression (mm): 0 mm
Stress Nuclear Isotope Dose: 31.4 mCi
TID: 0.82

## 2023-07-01 MED ORDER — TECHNETIUM TC 99M TETROFOSMIN IV KIT
31.3700 | PACK | Freq: Once | INTRAVENOUS | Status: AC | PRN
  Administered 2023-07-01: 31.37 via INTRAVENOUS

## 2023-07-01 MED ORDER — TECHNETIUM TC 99M TETROFOSMIN IV KIT
10.6400 | PACK | Freq: Once | INTRAVENOUS | Status: AC | PRN
  Administered 2023-07-01: 10.64 via INTRAVENOUS

## 2023-07-04 ENCOUNTER — Ambulatory Visit (INDEPENDENT_AMBULATORY_CARE_PROVIDER_SITE_OTHER): Payer: Medicaid Other

## 2023-07-04 DIAGNOSIS — Z8673 Personal history of transient ischemic attack (TIA), and cerebral infarction without residual deficits: Secondary | ICD-10-CM

## 2023-07-04 LAB — CUP PACEART REMOTE DEVICE CHECK
Date Time Interrogation Session: 20241215230437
Implantable Pulse Generator Implant Date: 20220405

## 2023-07-15 ENCOUNTER — Other Ambulatory Visit: Payer: Self-pay | Admitting: *Deleted

## 2023-07-15 ENCOUNTER — Telehealth: Payer: Self-pay | Admitting: *Deleted

## 2023-07-15 ENCOUNTER — Telehealth: Payer: Self-pay | Admitting: Cardiovascular Disease

## 2023-07-15 DIAGNOSIS — I5022 Chronic systolic (congestive) heart failure: Secondary | ICD-10-CM

## 2023-07-15 NOTE — Telephone Encounter (Signed)
-----   Message from [place] sent at 07/08/2023 11:45 AM EST ----- Inform patient that stress test showed evidence of prior anterior infarct without clear ischemia.  This is consistent with his prior myocardial infarction.  His ejection fraction appeared to be reduced.  I recommend scheduling him for an echocardiogram to better evaluate his EF.  Schedule a follow-up visit after the echocardiogram.  If he continues to have cardiac symptoms, he might require cardiac catheterization.

## 2023-07-15 NOTE — Telephone Encounter (Signed)
-----   Message from Nurse Misty Stanley R sent at 07/15/2023 10:47 AM EST ----- Patient needs first available echo, please

## 2023-07-15 NOTE — Telephone Encounter (Signed)
LVM to schedule echo appt, please schedule

## 2023-07-15 NOTE — Telephone Encounter (Signed)
Patient made aware of results and the need for an echo. He, at first, stated that he wants to schedule it before the first. He has been advised that this may not be possible and then stated that he would not be able to anyway due to work hours.   The patient then went on to express his frustration that he didn't understand why the echo was not ordered in the first place and why did he need to follow up with Dr. Kirke Corin after the echo as he just saw him.   The patient denied chest pain and shortness of breath and stated that he did not have any cardiac symptoms. He has been advised that someone will call him to schedule the echo and follow up according to his schedule.

## 2023-08-04 ENCOUNTER — Ambulatory Visit: Payer: Managed Care, Other (non HMO) | Attending: Cardiovascular Disease

## 2023-08-04 DIAGNOSIS — I5022 Chronic systolic (congestive) heart failure: Secondary | ICD-10-CM

## 2023-08-04 LAB — ECHOCARDIOGRAM COMPLETE
AV Mean grad: 3 mm[Hg]
AV Peak grad: 6.4 mm[Hg]
Ao pk vel: 1.26 m/s
Area-P 1/2: 5.13 cm2
S' Lateral: 3.2 cm

## 2023-08-04 MED ORDER — PERFLUTREN LIPID MICROSPHERE
1.0000 mL | INTRAVENOUS | Status: AC | PRN
  Administered 2023-08-04: 3 mL via INTRAVENOUS

## 2023-08-08 ENCOUNTER — Encounter: Payer: Self-pay | Admitting: Cardiology

## 2023-08-08 ENCOUNTER — Ambulatory Visit (INDEPENDENT_AMBULATORY_CARE_PROVIDER_SITE_OTHER): Payer: Managed Care, Other (non HMO)

## 2023-08-08 DIAGNOSIS — Z8673 Personal history of transient ischemic attack (TIA), and cerebral infarction without residual deficits: Secondary | ICD-10-CM

## 2023-08-08 LAB — CUP PACEART REMOTE DEVICE CHECK
Date Time Interrogation Session: 20250119230347
Implantable Pulse Generator Implant Date: 20220405

## 2023-08-09 ENCOUNTER — Ambulatory Visit: Payer: Managed Care, Other (non HMO) | Attending: Physician Assistant | Admitting: Physician Assistant

## 2023-08-09 VITALS — BP 120/78 | HR 97 | Ht 69.0 in | Wt 220.4 lb

## 2023-08-09 DIAGNOSIS — Z8673 Personal history of transient ischemic attack (TIA), and cerebral infarction without residual deficits: Secondary | ICD-10-CM | POA: Diagnosis not present

## 2023-08-09 DIAGNOSIS — E785 Hyperlipidemia, unspecified: Secondary | ICD-10-CM

## 2023-08-09 DIAGNOSIS — I25110 Atherosclerotic heart disease of native coronary artery with unstable angina pectoris: Secondary | ICD-10-CM | POA: Diagnosis not present

## 2023-08-09 DIAGNOSIS — I10 Essential (primary) hypertension: Secondary | ICD-10-CM

## 2023-08-09 DIAGNOSIS — I5020 Unspecified systolic (congestive) heart failure: Secondary | ICD-10-CM

## 2023-08-09 NOTE — Progress Notes (Signed)
Carelink Summary Report / Loop Recorder 

## 2023-08-09 NOTE — Patient Instructions (Signed)
Medication Instructions:  Your Physician recommend you continue on your current medication as directed.    *If you need a refill on your cardiac medications before your next appointment, please call your pharmacy*   Lab Work: Your provider would like for you to have following labs drawn today CBC and BMET.   If you have labs (blood work) drawn today and your tests are completely normal, you will receive your results only by: MyChart Message (if you have MyChart) OR A paper copy in the mail If you have any lab test that is abnormal or we need to change your treatment, we will call you to review the results.   Testing/Procedures:  [facility] [facility] DEPT OF [facility] AT [place] [address] [place] [address] Dept: [phone] Loc: [phone]  [name]  08/09/2023  You are scheduled for a Cardiac Catheterization on Friday, January 24 with Dr. Lorine Bears.  1. Please arrive at the [facility] Entrance of [facility], [address], [place] [address] at 7:45 AM (This is 1 hour(s) prior to your procedure time).  Proceed to the Check-In Desk directly inside the entrance.  Procedure Parking: Use the entrance off of the [address] side of the hospital. Turn right upon entering and follow the driveway to parking that is directly in front of the Heart & Vascular Center. There is no valet parking available at this entrance, however there is an awning directly in front of the Heart & Vascular Center for drop off/ pick up for patients.  Special note: Every effort is made to have your procedure done on time. Please understand that emergencies sometimes delay scheduled procedures.  2. Diet: Do not eat solid foods after midnight.  The patient may have clear liquids until 5am upon the day of the procedure.  3. Labs: You will need to have blood drawn today.  4. Medication instructions in  preparation for your procedure:   Contrast Allergy: No  Take only 7 units of insulin the night before your procedure. Do not take any insulin on the day of the procedure.  Do not take Diabetes Med Glucophage (Metformin) on the day of the procedure and HOLD 48 HOURS AFTER THE PROCEDURE.  On the morning of your procedure, take a Aspirin 81 mg and Brilinta/Ticagrelor and any morning medicines NOT listed above.  You may use sips of water.  5. Plan to go home the same day, you will only stay overnight if medically necessary. 6. Bring a current list of your medications and current insurance cards. 7. You MUST have a responsible person to drive you home. 8. Someone MUST be with you the first 24 hours after you arrive home or your discharge will be delayed. 9. Please wear clothes that are easy to get on and off and wear slip-on shoes.  Thank you for allowing [place] to care for you!   -- [facility]    Follow-Up: At [facility], you and your health needs are our priority.  As part of our continuing mission to provide you with exceptional heart care, we have created designated Provider Care Teams.  These Care Teams include your primary Cardiologist (physician) and Advanced Practice Providers (APPs -  Physician Assistants and Nurse Practitioners) who all work together to provide you with the care you need, when you need it.  We recommend signing up for the patient portal called "MyChart".  Sign up information is provided on this After Visit Summary.  MyChart is used to connect with patients for Virtual Visits (Telemedicine).  Patients are able to view lab/test results, encounter notes, upcoming appointments, etc.  Non-urgent messages can be sent to your provider as well.   To learn more about what you can do with MyChart, go to [email or web address].    Your next appointment:   With Dr Kirke Corin 3-4 weeks after cath  Provider:   You may see Lorine Bears, MD

## 2023-08-09 NOTE — H&P (View-Only) (Signed)
Cardiology Office Note    Date:  08/09/2023   ID:  [name], DOB 1961/01/10, MRN [id number]  PCP:  Sallee Provencal, FNP  Cardiologist:  Lorine Bears, MD  Electrophysiologist:  Hillis Range, MD (Inactive)   Chief Complaint: Follow-up  History of Present Illness:   [name] is a 63 y.o. male with history of CAD with anterior STEMI status post PCI/DES to the LAD in 10/2019, HFimpEF secondary to ICM with recurrence of cardiomyopathy by echo in 07/2023, CVA, DM2, HTN, HLD, and tobacco use who presents for follow-up of echo and treadmill MPI.  He was admitted to the hospital in 10/2019 with an anterior ST elevation MI.  Emergent LHC showed an occluded proximal LAD that was treated successfully with PCI/DES.  Echo showed an EF of 35 to 40% with severe hypokinesis of the entire anteroseptal and anterior wall.  Previously discontinued tobacco use following his MI.  Note indicates intolerance to prasugrel with improvement in symptoms following transitioning to clopidogrel.  He has also been transition from carvedilol to Toprol due to persistent tachycardia and shortness of breath.  He has documented myalgias with atorvastatin, though has tolerated rosuvastatin.  He was hospitalized in 10/2020 with acute CVA in the setting of noncompliance with antihypertensive medications imaging showed a left posterior thalamic punctate infarct.  TEE showed no evidence of cardiac source of embolization with an EF of 55 to 60%.    He was last seen in 06/2023 and reported worsening exertional dyspnea as well as episodes of palpitations and chest discomfort, particularly at night.  Symptoms felt similar to what he experienced leading up to his MI in 2021.  He had been under increased stress and resumed smoking with 1 pack lasting him 3 weeks.  Treadmill MPI on 07/01/2023 showed the patient was able to exercise for 7 minutes and 10 seconds with 92% MPHR, achieving 8.3 METs.  Imaging demonstrated a large sized,  severe, fixed defect involving the mid anteroseptal, mid inferoseptal, apical anterior, apical septal, and apical segments consistent with infarct in the LAD territory.  No significant ischemia was identified.  LV systolic function appeared mildly reduced by visual estimation with anterior and septal hypokinesis.  Coronary artery calcification and prior LAD stenting noted, along with minimal aortic atherosclerosis.  Overall, this was an intermediate risk study.  Echo on 08/04/2023 showed an EF of 40-45% with severe hypokinesis of the mid to distal anterior and anteroseptal wall with thinning of the myocardium, grade 1 diastolic dysfunction, and normal RV systolic function and ventricular cavity size.  He comes in today and is doing well from a cardiac perspective.  He discontinued tobacco use on 07/16/2023.  He notes an improvement in his "heart pounding" sensation and chest discomfort that were similar to his angina leading up to his MI in 2021.  He reports he is "not disciplined enough" to take medications as directed.  In this setting, he does not want to pursue escalation of antianginal therapy and would like to undergo cardiac cath to evaluate his progressive cardiomyopathy and chest discomfort.  No dizziness, presyncope, or syncope.  Continues to be under increased stress.  With regards to his heart pounding sensation, review of loop recorder shows no arrhythmias.  Currently without symptoms of angina or cardiac decompensation.   Labs independently reviewed: 05/2023 - TSH normal, A1c 7.0, TC 111, TG 130, HDL 39, LDL 49, BUN 22, serum creatinine 1.09, potassium 4.4, albumin 4.7, AST/ALT normal, Hgb 15.8, PLT 313  Past Medical History:  Diagnosis Date   Anemia 10/03/2009   Qualifier: Diagnosis of  By: Nena Jordan    Anxiety associated with depression 10/11/2013   Colon cancer [facility])    right colon cancer- adenocarcinoma CEA level isnrmal at 1.8   Diabetes mellitus    Diabetes mellitus type 2  in obese 03/20/2010   Qualifier: Diagnosis of  By: Nena Jordan     Erectile dysfunction 06/22/2016   Essential hypertension 06/05/2014   FATTY LIVER DISEASE 11/04/2009   Qualifier: Diagnosis of  By: Marina Goodell MD, Wilhemina Bonito   Qualifier: Diagnosis of  By: Marina Goodell MD, Wilhemina Bonito  Last Assessment & Plan:  conirmed by CT scan of abdomen in April 2016 encouraged to minimize simple carbs and fatty foods.   GERD 11/04/2009   Qualifier: Diagnosis of  By: Marina Goodell MD, Wilhemina Bonito    Great toe pain, right 04/18/2017   Hepatic artery stenosis [facility])    History of colon cancer 01/02/2010   Qualifier: Diagnosis of  By: Nena Jordan Dr Marina Goodell  Last Assessment & Plan:  Follows closely with gastroenterology, Dr Marina Goodell   Hyperlipidemia    Hyperlipidemia, mixed 09/07/2010   Qualifier: Diagnosis of  By: Nena Jordan    Hypertension    Hypertriglyceridemia 12/14/2010   Hypotestosteronism 06/22/2016   INSOMNIA, CHRONIC 03/20/2010   Qualifier: Diagnosis of  By: Nena Jordan Ambien 10 mg daily does not keep asleep AdvilPM is over sedating and has trouble waking up    Iron deficiency anemia 2011   Low testosterone 06/22/2016   Malignant neoplasm of colon (HCC) 01/02/2010   Qualifier: Diagnosis of  By: Nena Jordan Dr Marina Goodell    Muscle spasm of back 06/05/2014   Nipple pain 09/30/2016   Obesity    Palpitations 06/05/2014   Preventative health care 10/08/2012   Psoriasis    Psoriatic arthritis (HCC) 08/25/2017   Right knee pain 11/17/2011   Stroke [facility])     Past Surgical History:  Procedure Laterality Date   BUBBLE STUDY  10/21/2020   Procedure: BUBBLE STUDY;  Surgeon: Chrystie Nose, MD;  Location: [facility] ENDOSCOPY;  Service: Cardiovascular;;   CARPAL TUNNEL RELEASE     RIGHT   CHOLECYSTECTOMY  1994   COLONOSCOPY     CORONARY/GRAFT ACUTE MI REVASCULARIZATION N/A 10/27/2019   Procedure: Coronary/Graft Acute MI Revascularization;  Surgeon: Iran Ouch, MD;  Location: [facility] INVASIVE CV LAB;  Service:  Cardiovascular;  Laterality: N/A;   HEMICOLECTOMY     12/17/2009 right   LEFT HEART CATH AND CORONARY ANGIOGRAPHY N/A 10/27/2019   Procedure: LEFT HEART CATH AND CORONARY ANGIOGRAPHY;  Surgeon: Iran Ouch, MD;  Location: [facility] INVASIVE CV LAB;  Service: Cardiovascular;  Laterality: N/A;   LOOP RECORDER INSERTION N/A 10/21/2020   Procedure: LOOP RECORDER INSERTION;  Surgeon: Hillis Range, MD;  Location: [facility] INVASIVE CV LAB;  Service: Cardiovascular;  Laterality: N/A;   POLYPECTOMY     TEE WITHOUT CARDIOVERSION N/A 10/21/2020   Procedure: TRANSESOPHAGEAL ECHOCARDIOGRAM (TEE);  Surgeon: Chrystie Nose, MD;  Location: [facility] ENDOSCOPY;  Service: Cardiovascular;  Laterality: N/A;    Current Medications: No outpatient medications have been marked as taking for the 08/09/23 encounter (Office Visit) with Sondra Barges, PA-C.    Allergies:   Clopidogrel   Social History   Socioeconomic History   Marital status: Single    Spouse name: [name]   Number of children: 0   Years of education: Not  on file   Highest education level: Associate degree: occupational, Scientist, product/process development, or vocational program  Occupational History   Occupation: Pensions consultant    Comment: printers and fax  Tobacco Use   Smoking status: Every Day    Current packs/day: 0.00    Types: Cigarettes    Last attempt to quit: 10/27/2019    Years since quitting: 3.7   Smokeless tobacco: Never   Tobacco comments:    Smokes 1 Pack per week; started back 3 months ago  Vaping Use   Vaping status: Never Used  Substance and Sexual Activity   Alcohol use: No   Drug use: No   Sexual activity: Yes    Comment: lives with girlfriend, no dietary restrictions  Other Topics Concern   Not on file  Social History Narrative   Occupation: Pensions consultant (Conservator, museum/gallery)   Single  (lives with Joslin)   no children    former smoker   Illicit Drug Use - no    Social Drivers of Corporate investment banker Strain: Low Risk  (05/18/2023)   Overall Financial  Resource Strain (CARDIA)    Difficulty of Paying Living Expenses: Not hard at all  Food Insecurity: No Food Insecurity (05/18/2023)   Hunger Vital Sign    Worried About Running Out of Food in the Last Year: Never true    Ran Out of Food in the Last Year: Never true  Transportation Needs: No Transportation Needs (05/18/2023)   PRAPARE - Administrator, Civil Service (Medical): No    Lack of Transportation (Non-Medical): No  Physical Activity: Sufficiently Active (05/18/2023)   Exercise Vital Sign    Days of Exercise per Week: 5 days    Minutes of Exercise per Session: 30 min  Stress: No Stress Concern Present (05/18/2023)   [employer] of Occupational Health - Occupational Stress Questionnaire    Feeling of Stress : Not at all  Social Connections: Socially Isolated (05/18/2023)   Social Connection and Isolation Panel [NHANES]    Frequency of Communication with Friends and Family: Once a week    Frequency of Social Gatherings with Friends and Family: Once a week    Attends Religious Services: Never    Database administrator or Organizations: No    Attends Engineer, structural: Not on file    Marital Status: Never married     Family History:  The patient's family history includes Alcohol abuse in his brother; Diabetes in his brother; Diabetes type II in his brother; Stomach cancer in his mother. There is no history of Other, Coronary artery disease, Cancer, Colon cancer, Esophageal cancer, or Rectal cancer.  ROS:   12-point review of systems is negative unless otherwise noted in the HPI.   EKGs/Labs/Other Studies Reviewed:    Studies reviewed were summarized above. The additional studies were reviewed today:  2D echo 08/04/2023: 1. Left ventricular ejection fraction, by estimation, is 40 to 45%. The  left ventricle has mildly decreased function. The left ventricle  demonstrates regional wall motion abnormalities (severe hypokinesis of the  mid to distal  anterior and anteroseptal  wall with thinning of the myocardium). Left ventricular diastolic  parameters are consistent with Grade I diastolic dysfunction (impaired  relaxation).   2. Right ventricular systolic function is normal. The right ventricular  size is normal.   3. The mitral valve is normal in structure. No evidence of mitral valve  regurgitation. No evidence of mitral stenosis.   4. The aortic valve  has an indeterminant number of cusps. Aortic valve  regurgitation is not visualized. No aortic stenosis is present.   5. The inferior vena cava is normal in size with greater than 50%  respiratory variability, suggesting right atrial pressure of 3 mmHg.  __________  Treadmill MPI 07/01/2023:   Abnormal exercise myocardial perfusion stress test.   There is a large in size, severe, fixed defect involving the mid anteroseptal, mid inferoseptal, apical anterior, apical septal, and apical segments consistent with infarct in the LAD territory.   No significant ischemia is identified.   Left ventricular systolic function appears mildly reduced by visual estimation with anterior and septal hypokinesis. Calculated EF 51% by Siemens and 30% by QGS calculations.   Coronary artery calcification and/or stent noted in the LAD.  Minimal aortic atherosclerosis is present.   This is an intermediate risk study. __________  TEE 10/21/2020: 1. Left ventricular ejection fraction, by estimation, is 55 to 60%. The  left ventricle has normal function. There is mild left ventricular  hypertrophy.   2. Right ventricular systolic function is normal. The right ventricular  size is normal.   3. Left atrial size was moderately dilated. No left atrial/left atrial  appendage thrombus was detected.   4. The mitral valve is grossly normal. Trivial mitral valve  regurgitation.   5. The aortic valve is tricuspid. Aortic valve regurgitation is not  visualized.   Conclusion(s)/Recommendation(s): No LA/LAA thrombus  identified. Negative  bubble study for interatrial shunt. No intracardiac source of embolism  detected on this on this transesophageal echocardiogram.  __________  2D echo 10/19/2020: 1. Left ventricular ejection fraction, by estimation, is 55 to 60%. The  left ventricle has normal function. The left ventricle demonstrates  regional wall motion abnormalities (see scoring diagram/findings for  description). There is mild left ventricular   hypertrophy. Left ventricular diastolic parameters are consistent with  Grade II diastolic dysfunction (pseudonormalization).   2. Right ventricular systolic function is normal. The right ventricular  size is normal.   3. Left atrial size was moderately dilated.   4. The mitral valve is normal in structure. No evidence of mitral valve  regurgitation. No evidence of mitral stenosis.   5. The aortic valve is normal in structure. Aortic valve regurgitation is  not visualized. No aortic stenosis is present.   6. The inferior vena cava is normal in size with greater than 50%  respiratory variability, suggesting right atrial pressure of 3 mmHg.  __________  2D echo 10/28/2019: 1. Left ventricular ejection fraction, by estimation, is 35 to 40%. The  left ventricle has moderately decreased function. The left ventricle  demonstrates regional wall motion abnormalities (see scoring  diagram/findings for description). Left ventricular   diastolic parameters are consistent with Grade I diastolic dysfunction  (impaired relaxation). There is severe hypokinesis of the left  ventricular, entire anteroseptal wall and anterior wall.   2. Right ventricular systolic function is normal. The right ventricular  size is normal.   3. The mitral valve is normal in structure. No evidence of mitral valve  regurgitation.   4. The aortic valve is normal in structure. Aortic valve regurgitation is  not visualized.   5. The inferior vena cava is dilated in size with >50%  respiratory  variability, suggesting right atrial pressure of 8 mmHg.  __________  LHC 10/27/2019: A drug-eluting stent was successfully placed using a STENT RESOLUTE ONYX 2.75X30. Prox LAD to Mid LAD lesion is 100% stenosed. Post intervention, there is a 0% residual stenosis.  1st Diag-1 lesion is 90% stenosed. 1st Diag-2 lesion is 80% stenosed.   1.  Codominant coronary arteries with thrombotic occlusion of the proximal LAD which is the culprit for anterior ST elevation myocardial infarction.  No other obstructive disease. 2.  Left ventricular angiography was not performed due to severely elevated left ventricular end-diastolic pressure at 34 mmHg 3.  Successful angioplasty and drug-eluting stent placement to the proximal LAD.   Recommendations: We will use Aggrastat infusion for 18 hours due to residual thrombus in first diagonal. Dual antiplatelet therapy for at least 1 year. Aggressive treatment of risk factors. The patient was given 1 dose of IV furosemide 40 mg once daily before he left the Cath Lab.  I do not expect him to require more diuresis but can be followed clinically. I ordered an echocardiogram.   EKG:  EKG is ordered today.  The EKG ordered today demonstrates NSR, 97 bpm, low voltage QRS, possible prior septal infarct, no acute ST-T changes, consistent with prior tracing  Recent Labs: 05/20/2023: ALT 15; BUN 22; Creatinine, Ser 1.09; Hemoglobin 15.8; Platelets 313; Potassium 4.4; Sodium 135; TSH 3.080  Recent Lipid Panel    Component Value Date/Time   CHOL 111 05/20/2023 1040   CHOL 949 (H) 10/27/2014 1322   TRIG 130 05/20/2023 1040   TRIG 1,098 (H) 10/30/2014 0605   HDL 39 (L) 05/20/2023 1040   HDL 20 (L) 10/27/2014 1322   CHOLHDL 1.9 04/05/2022 0848   CHOLHDL 5 03/13/2021 1317   VLDL 51 (H) 10/19/2020 0116   VLDL SEE COMMENT 10/27/2014 1322   LDLCALC 49 05/20/2023 1040   LDLCALC SEE COMMENT 10/27/2014 1322   LDLDIRECT 76.0 03/13/2021 1317    PHYSICAL  EXAM:    VS:  BP 120/78 (BP Location: Left Arm, Patient Position: Sitting)   Pulse 97   Ht 5\' 9"  (1.753 m)   Wt 220 lb 6.4 oz (100 kg)   SpO2 98%   BMI 32.55 kg/m   BMI: Body mass index is 32.55 kg/m.  Physical Exam Vitals reviewed.  Constitutional:      Appearance: He is well-developed.  HENT:     Head: Normocephalic and atraumatic.  Eyes:     General:        Right eye: No discharge.        Left eye: No discharge.  Neck:     Vascular: No JVD.  Cardiovascular:     Rate and Rhythm: Normal rate and regular rhythm.     Heart sounds: Normal heart sounds, S1 normal and S2 normal. Heart sounds not distant. No midsystolic click and no opening snap. No murmur heard.    No friction rub.  Pulmonary:     Effort: Pulmonary effort is normal. No respiratory distress.     Breath sounds: Normal breath sounds. No decreased breath sounds, wheezing or rales.  Chest:     Chest wall: No tenderness.  Abdominal:     General: There is no distension.  Musculoskeletal:     Cervical back: Normal range of motion.  Skin:    General: Skin is warm and dry.     Nails: There is no clubbing.  Neurological:     Mental Status: He is alert and oriented to person, place, and time.  Psychiatric:        Speech: Speech normal.        Behavior: Behavior normal.        Thought Content: Thought content normal.  Judgment: Judgment normal.     Wt Readings from Last 3 Encounters:  08/09/23 220 lb 6.4 oz (100 kg)  06/24/23 225 lb 6 oz (102.2 kg)  05/20/23 216 lb 6.4 oz (98.2 kg)     ASSESSMENT & PLAN:   CAD involving the native coronary arteries with unstable angina and progressive cardiomyopathy: Currently without symptoms of angina or cardiac decompensation.  Declines escalation of pharmacotherapy for symptoms and cardiomyopathy.  Would like to pursue with cardiac cath.  He will otherwise continue current pharmacotherapy including ticagrelor 60 mg twice daily, rosuvastatin, metoprolol, and  lisinopril.  HFrEF secondary to ICM: Noted to have a progressive cardiomyopathy on recent echo.  Pursue cardiac cath as outlined above.  Remains on Toprol-XL 5 mg and lisinopril 20 mg.  Declines escalation of GDMT.  HTN: Blood pressure is well-controlled in the office today.  Continue pharmacotherapy as outlined above.  HLD: LDL 49 in 05/2023.  Continue rosuvastatin 20 mg.  History of CVA: No new deficits.  Status post loop recorder with ongoing monitoring per EP.  Remains on rosuvastatin as above.   Informed Consent   Shared Decision Making/Informed Consent{  The risks [stroke (1 in 1000), death (1 in 1000), kidney failure [usually temporary] (1 in 500), bleeding (1 in 200), allergic reaction [possibly serious] (1 in 200)], benefits (diagnostic support and management of coronary artery disease) and alternatives of a cardiac catheterization were discussed in detail with Mr. Shaff and he is willing to proceed.        Disposition: F/u with Dr. Kirke Corin 2 weeks after [facility].   Medication Adjustments/Labs and Tests Ordered: Current medicines are reviewed at length with the patient today.  Concerns regarding medicines are outlined above. Medication changes, Labs and Tests ordered today are summarized above and listed in the Patient Instructions accessible in Encounters.   Signed, [name], PA-C 08/09/2023 4:59 PM     [facility] [place] [address] [place], [place] [address] [phone]

## 2023-08-09 NOTE — Progress Notes (Signed)
Cardiology Office Note    Date:  08/09/2023   ID:  Zackory Czarnowski, DOB [birth year], MRN [id number]  PCP:  Sallee Provencal, FNP  Cardiologist:  Lorine Bears, MD  Electrophysiologist:  Hillis Range, MD (Inactive)   Chief Complaint: Follow-up  History of Present Illness:   [name] is a 63 y.o. male with history of CAD with anterior STEMI status post PCI/DES to the LAD in 10/2019, HFimpEF secondary to ICM with recurrence of cardiomyopathy by echo in 07/2023, CVA, DM2, HTN, HLD, and tobacco use who presents for follow-up of echo and treadmill MPI.  He was admitted to the hospital in 10/2019 with an anterior ST elevation MI.  Emergent LHC showed an occluded proximal LAD that was treated successfully with PCI/DES.  Echo showed an EF of 35 to 40% with severe hypokinesis of the entire anteroseptal and anterior wall.  Previously discontinued tobacco use following his MI.  Note indicates intolerance to prasugrel with improvement in symptoms following transitioning to clopidogrel.  He has also been transition from carvedilol to Toprol due to persistent tachycardia and shortness of breath.  He has documented myalgias with atorvastatin, though has tolerated rosuvastatin.  He was hospitalized in 10/2020 with acute CVA in the setting of noncompliance with antihypertensive medications imaging showed a left posterior thalamic punctate infarct.  TEE showed no evidence of cardiac source of embolization with an EF of 55 to 60%.    He was last seen in 06/2023 and reported worsening exertional dyspnea as well as episodes of palpitations and chest discomfort, particularly at night.  Symptoms felt similar to what he experienced leading up to his MI in 2021.  He had been under increased stress and resumed smoking with 1 pack lasting him 3 weeks.  Treadmill MPI on 07/01/2023 showed the patient was able to exercise for 7 minutes and 10 seconds with 92% MPHR, achieving 8.3 METs.  Imaging demonstrated a large sized,  severe, fixed defect involving the mid anteroseptal, mid inferoseptal, apical anterior, apical septal, and apical segments consistent with infarct in the LAD territory.  No significant ischemia was identified.  LV systolic function appeared mildly reduced by visual estimation with anterior and septal hypokinesis.  Coronary artery calcification and prior LAD stenting noted, along with minimal aortic atherosclerosis.  Overall, this was an intermediate risk study.  Echo on 08/04/2023 showed an EF of 40-45% with severe hypokinesis of the mid to distal anterior and anteroseptal wall with thinning of the myocardium, grade 1 diastolic dysfunction, and normal RV systolic function and ventricular cavity size.  He comes in today and is doing well from a cardiac perspective.  He discontinued tobacco use on 07/16/2023.  He notes an improvement in his "heart pounding" sensation and chest discomfort that were similar to his angina leading up to his MI in 2021.  He reports he is "not disciplined enough" to take medications as directed.  In this setting, he does not want to pursue escalation of antianginal therapy and would like to undergo cardiac cath to evaluate his progressive cardiomyopathy and chest discomfort.  No dizziness, presyncope, or syncope.  Continues to be under increased stress.  With regards to his heart pounding sensation, review of loop recorder shows no arrhythmias.  Currently without symptoms of angina or cardiac decompensation.   Labs independently reviewed: 05/2023 - TSH normal, A1c 7.0, TC 111, TG 130, HDL 39, LDL 49, BUN 22, serum creatinine 1.09, potassium 4.4, albumin 4.7, AST/ALT normal, Hgb 15.8, PLT 313  Past Medical History:  Diagnosis Date   Anemia 10/03/2009   Qualifier: Diagnosis of  By: Nena Jordan    Anxiety associated with depression 10/11/2013   Colon cancer [facility])    right colon cancer- adenocarcinoma CEA level isnrmal at 1.8   Diabetes mellitus    Diabetes mellitus type 2  in obese 03/20/2010   Qualifier: Diagnosis of  By: Nena Jordan     Erectile dysfunction 06/22/2016   Essential hypertension 06/05/2014   FATTY LIVER DISEASE 11/04/2009   Qualifier: Diagnosis of  By: Marina Goodell MD, Wilhemina Bonito   Qualifier: Diagnosis of  By: Marina Goodell MD, Wilhemina Bonito  Last Assessment & Plan:  conirmed by CT scan of abdomen in April 2016 encouraged to minimize simple carbs and fatty foods.   GERD 11/04/2009   Qualifier: Diagnosis of  By: Marina Goodell MD, Wilhemina Bonito    Great toe pain, right 04/18/2017   Hepatic artery stenosis [facility])    History of colon cancer 01/02/2010   Qualifier: Diagnosis of  By: Nena Jordan Dr Marina Goodell  Last Assessment & Plan:  Follows closely with gastroenterology, Dr Marina Goodell   Hyperlipidemia    Hyperlipidemia, mixed 09/07/2010   Qualifier: Diagnosis of  By: Nena Jordan    Hypertension    Hypertriglyceridemia 12/14/2010   Hypotestosteronism 06/22/2016   INSOMNIA, CHRONIC 03/20/2010   Qualifier: Diagnosis of  By: Nena Jordan Ambien 10 mg daily does not keep asleep AdvilPM is over sedating and has trouble waking up    Iron deficiency anemia 2011   Low testosterone 06/22/2016   Malignant neoplasm of colon (HCC) 01/02/2010   Qualifier: Diagnosis of  By: Nena Jordan Dr Marina Goodell    Muscle spasm of back 06/05/2014   Nipple pain 09/30/2016   Obesity    Palpitations 06/05/2014   Preventative health care 10/08/2012   Psoriasis    Psoriatic arthritis (HCC) 08/25/2017   Right knee pain 11/17/2011   Stroke [facility])     Past Surgical History:  Procedure Laterality Date   BUBBLE STUDY  10/21/2020   Procedure: BUBBLE STUDY;  Surgeon: Chrystie Nose, MD;  Location: [facility] ENDOSCOPY;  Service: Cardiovascular;;   CARPAL TUNNEL RELEASE     RIGHT   CHOLECYSTECTOMY  1994   COLONOSCOPY     CORONARY/GRAFT ACUTE MI REVASCULARIZATION N/A 10/27/2019   Procedure: Coronary/Graft Acute MI Revascularization;  Surgeon: Iran Ouch, MD;  Location: [facility] INVASIVE CV LAB;  Service:  Cardiovascular;  Laterality: N/A;   HEMICOLECTOMY     12/17/2009 right   LEFT HEART CATH AND CORONARY ANGIOGRAPHY N/A 10/27/2019   Procedure: LEFT HEART CATH AND CORONARY ANGIOGRAPHY;  Surgeon: Iran Ouch, MD;  Location: [facility] INVASIVE CV LAB;  Service: Cardiovascular;  Laterality: N/A;   LOOP RECORDER INSERTION N/A 10/21/2020   Procedure: LOOP RECORDER INSERTION;  Surgeon: Hillis Range, MD;  Location: [facility] INVASIVE CV LAB;  Service: Cardiovascular;  Laterality: N/A;   POLYPECTOMY     TEE WITHOUT CARDIOVERSION N/A 10/21/2020   Procedure: TRANSESOPHAGEAL ECHOCARDIOGRAM (TEE);  Surgeon: Chrystie Nose, MD;  Location: [facility] ENDOSCOPY;  Service: Cardiovascular;  Laterality: N/A;    Current Medications: No outpatient medications have been marked as taking for the 08/09/23 encounter (Office Visit) with Sondra Barges, PA-C.    Allergies:   Clopidogrel   Social History   Socioeconomic History   Marital status: Single    Spouse name: [name]   Number of children: 0   Years of education: Not  on file   Highest education level: Associate degree: occupational, Scientist, product/process development, or vocational program  Occupational History   Occupation: Pensions consultant    Comment: printers and fax  Tobacco Use   Smoking status: Every Day    Current packs/day: 0.00    Types: Cigarettes    Last attempt to quit: 10/27/2019    Years since quitting: 3.7   Smokeless tobacco: Never   Tobacco comments:    Smokes 1 Pack per week; started back 3 months ago  Vaping Use   Vaping status: Never Used  Substance and Sexual Activity   Alcohol use: No   Drug use: No   Sexual activity: Yes    Comment: lives with girlfriend, no dietary restrictions  Other Topics Concern   Not on file  Social History Narrative   Occupation: Pensions consultant (Conservator, museum/gallery)   Single  (lives with [place])   no children    former smoker   Illicit Drug Use - no    Social Drivers of Corporate investment banker Strain: Low Risk  (05/18/2023)   Overall Financial  Resource Strain (CARDIA)    Difficulty of Paying Living Expenses: Not hard at all  Food Insecurity: No Food Insecurity (05/18/2023)   Hunger Vital Sign    Worried About Running Out of Food in the Last Year: Never true    Ran Out of Food in the Last Year: Never true  Transportation Needs: No Transportation Needs (05/18/2023)   PRAPARE - Administrator, Civil Service (Medical): No    Lack of Transportation (Non-Medical): No  Physical Activity: Sufficiently Active (05/18/2023)   Exercise Vital Sign    Days of Exercise per Week: 5 days    Minutes of Exercise per Session: 30 min  Stress: No Stress Concern Present (05/18/2023)   [employer] of Occupational Health - Occupational Stress Questionnaire    Feeling of Stress : Not at all  Social Connections: Socially Isolated (05/18/2023)   Social Connection and Isolation Panel [NHANES]    Frequency of Communication with Friends and Family: Once a week    Frequency of Social Gatherings with Friends and Family: Once a week    Attends Religious Services: Never    Database administrator or Organizations: No    Attends Engineer, structural: Not on file    Marital Status: Never married     Family History:  The patient's family history includes Alcohol abuse in his brother; Diabetes in his brother; Diabetes type II in his brother; Stomach cancer in his mother. There is no history of Other, Coronary artery disease, Cancer, Colon cancer, Esophageal cancer, or Rectal cancer.  ROS:   12-point review of systems is negative unless otherwise noted in the HPI.   EKGs/Labs/Other Studies Reviewed:    Studies reviewed were summarized above. The additional studies were reviewed today:  2D echo 08/04/2023: 1. Left ventricular ejection fraction, by estimation, is 40 to 45%. The  left ventricle has mildly decreased function. The left ventricle  demonstrates regional wall motion abnormalities (severe hypokinesis of the  mid to distal  anterior and anteroseptal  wall with thinning of the myocardium). Left ventricular diastolic  parameters are consistent with Grade I diastolic dysfunction (impaired  relaxation).   2. Right ventricular systolic function is normal. The right ventricular  size is normal.   3. The mitral valve is normal in structure. No evidence of mitral valve  regurgitation. No evidence of mitral stenosis.   4. The aortic valve  has an indeterminant number of cusps. Aortic valve  regurgitation is not visualized. No aortic stenosis is present.   5. The inferior vena cava is normal in size with greater than 50%  respiratory variability, suggesting right atrial pressure of 3 mmHg.  __________  Treadmill MPI 07/01/2023:   Abnormal exercise myocardial perfusion stress test.   There is a large in size, severe, fixed defect involving the mid anteroseptal, mid inferoseptal, apical anterior, apical septal, and apical segments consistent with infarct in the LAD territory.   No significant ischemia is identified.   Left ventricular systolic function appears mildly reduced by visual estimation with anterior and septal hypokinesis. Calculated EF 51% by Siemens and 30% by QGS calculations.   Coronary artery calcification and/or stent noted in the LAD.  Minimal aortic atherosclerosis is present.   This is an intermediate risk study. __________  TEE 10/21/2020: 1. Left ventricular ejection fraction, by estimation, is 55 to 60%. The  left ventricle has normal function. There is mild left ventricular  hypertrophy.   2. Right ventricular systolic function is normal. The right ventricular  size is normal.   3. Left atrial size was moderately dilated. No left atrial/left atrial  appendage thrombus was detected.   4. The mitral valve is grossly normal. Trivial mitral valve  regurgitation.   5. The aortic valve is tricuspid. Aortic valve regurgitation is not  visualized.   Conclusion(s)/Recommendation(s): No LA/LAA thrombus  identified. Negative  bubble study for interatrial shunt. No intracardiac source of embolism  detected on this on this transesophageal echocardiogram.  __________  2D echo 10/19/2020: 1. Left ventricular ejection fraction, by estimation, is 55 to 60%. The  left ventricle has normal function. The left ventricle demonstrates  regional wall motion abnormalities (see scoring diagram/findings for  description). There is mild left ventricular   hypertrophy. Left ventricular diastolic parameters are consistent with  Grade II diastolic dysfunction (pseudonormalization).   2. Right ventricular systolic function is normal. The right ventricular  size is normal.   3. Left atrial size was moderately dilated.   4. The mitral valve is normal in structure. No evidence of mitral valve  regurgitation. No evidence of mitral stenosis.   5. The aortic valve is normal in structure. Aortic valve regurgitation is  not visualized. No aortic stenosis is present.   6. The inferior vena cava is normal in size with greater than 50%  respiratory variability, suggesting right atrial pressure of 3 mmHg.  __________  2D echo 10/28/2019: 1. Left ventricular ejection fraction, by estimation, is 35 to 40%. The  left ventricle has moderately decreased function. The left ventricle  demonstrates regional wall motion abnormalities (see scoring  diagram/findings for description). Left ventricular   diastolic parameters are consistent with Grade I diastolic dysfunction  (impaired relaxation). There is severe hypokinesis of the left  ventricular, entire anteroseptal wall and anterior wall.   2. Right ventricular systolic function is normal. The right ventricular  size is normal.   3. The mitral valve is normal in structure. No evidence of mitral valve  regurgitation.   4. The aortic valve is normal in structure. Aortic valve regurgitation is  not visualized.   5. The inferior vena cava is dilated in size with >50%  respiratory  variability, suggesting right atrial pressure of 8 mmHg.  __________  LHC 10/27/2019: A drug-eluting stent was successfully placed using a STENT RESOLUTE ONYX 2.75X30. Prox LAD to Mid LAD lesion is 100% stenosed. Post intervention, there is a 0% residual stenosis.  1st Diag-1 lesion is 90% stenosed. 1st Diag-2 lesion is 80% stenosed.   1.  Codominant coronary arteries with thrombotic occlusion of the proximal LAD which is the culprit for anterior ST elevation myocardial infarction.  No other obstructive disease. 2.  Left ventricular angiography was not performed due to severely elevated left ventricular end-diastolic pressure at 34 mmHg 3.  Successful angioplasty and drug-eluting stent placement to the proximal LAD.   Recommendations: We will use Aggrastat infusion for 18 hours due to residual thrombus in first diagonal. Dual antiplatelet therapy for at least 1 year. Aggressive treatment of risk factors. The patient was given 1 dose of IV furosemide 40 mg once daily before he left the Cath Lab.  I do not expect him to require more diuresis but can be followed clinically. I ordered an echocardiogram.   EKG:  EKG is ordered today.  The EKG ordered today demonstrates NSR, 97 bpm, low voltage QRS, possible prior septal infarct, no acute ST-T changes, consistent with prior tracing  Recent Labs: 05/20/2023: ALT 15; BUN 22; Creatinine, Ser 1.09; Hemoglobin 15.8; Platelets 313; Potassium 4.4; Sodium 135; TSH 3.080  Recent Lipid Panel    Component Value Date/Time   CHOL 111 05/20/2023 1040   CHOL 949 (H) 10/27/2014 1322   TRIG 130 05/20/2023 1040   TRIG 1,098 (H) 10/30/2014 0605   HDL 39 (L) 05/20/2023 1040   HDL 20 (L) 10/27/2014 1322   CHOLHDL 1.9 04/05/2022 0848   CHOLHDL 5 03/13/2021 1317   VLDL 51 (H) 10/19/2020 0116   VLDL SEE COMMENT 10/27/2014 1322   LDLCALC 49 05/20/2023 1040   LDLCALC SEE COMMENT 10/27/2014 1322   LDLDIRECT 76.0 03/13/2021 1317    PHYSICAL  EXAM:    VS:  BP 120/78 (BP Location: Left Arm, Patient Position: Sitting)   Pulse 97   Ht 5\' 9"  (1.753 m)   Wt 220 lb 6.4 oz (100 kg)   SpO2 98%   BMI 32.55 kg/m   BMI: Body mass index is 32.55 kg/m.  Physical Exam Vitals reviewed.  Constitutional:      Appearance: He is well-developed.  HENT:     Head: Normocephalic and atraumatic.  Eyes:     General:        Right eye: No discharge.        Left eye: No discharge.  Neck:     Vascular: No JVD.  Cardiovascular:     Rate and Rhythm: Normal rate and regular rhythm.     Heart sounds: Normal heart sounds, S1 normal and S2 normal. Heart sounds not distant. No midsystolic click and no opening snap. No murmur heard.    No friction rub.  Pulmonary:     Effort: Pulmonary effort is normal. No respiratory distress.     Breath sounds: Normal breath sounds. No decreased breath sounds, wheezing or rales.  Chest:     Chest wall: No tenderness.  Abdominal:     General: There is no distension.  Musculoskeletal:     Cervical back: Normal range of motion.  Skin:    General: Skin is warm and dry.     Nails: There is no clubbing.  Neurological:     Mental Status: He is alert and oriented to person, place, and time.  Psychiatric:        Speech: Speech normal.        Behavior: Behavior normal.        Thought Content: Thought content normal.  Judgment: Judgment normal.     Wt Readings from Last 3 Encounters:  08/09/23 220 lb 6.4 oz (100 kg)  06/24/23 225 lb 6 oz (102.2 kg)  05/20/23 216 lb 6.4 oz (98.2 kg)     ASSESSMENT & PLAN:   CAD involving the native coronary arteries with unstable angina and progressive cardiomyopathy: Currently without symptoms of angina or cardiac decompensation.  Declines escalation of pharmacotherapy for symptoms and cardiomyopathy.  Would like to pursue with cardiac cath.  He will otherwise continue current pharmacotherapy including ticagrelor 60 mg twice daily, rosuvastatin, metoprolol, and  lisinopril.  HFrEF secondary to ICM: Noted to have a progressive cardiomyopathy on recent echo.  Pursue cardiac cath as outlined above.  Remains on Toprol-XL 5 mg and lisinopril 20 mg.  Declines escalation of GDMT.  HTN: Blood pressure is well-controlled in the office today.  Continue pharmacotherapy as outlined above.  HLD: LDL 49 in 05/2023.  Continue rosuvastatin 20 mg.  History of CVA: No new deficits.  Status post loop recorder with ongoing monitoring per EP.  Remains on rosuvastatin as above.   Informed Consent   Shared Decision Making/Informed Consent{  The risks [stroke (1 in 1000), death (1 in 1000), kidney failure [usually temporary] (1 in 500), bleeding (1 in 200), allergic reaction [possibly serious] (1 in 200)], benefits (diagnostic support and management of coronary artery disease) and alternatives of a cardiac catheterization were discussed in detail with Mr. Klepacki and he is willing to proceed.        Disposition: F/u with Dr. Kirke Corin 2 weeks after [facility].   Medication Adjustments/Labs and Tests Ordered: Current medicines are reviewed at length with the patient today.  Concerns regarding medicines are outlined above. Medication changes, Labs and Tests ordered today are summarized above and listed in the Patient Instructions accessible in Encounters.   Signed, [name], PA-C 08/09/2023 4:59 PM      [facility] [address] [place], [place] [address] [phone]

## 2023-08-10 LAB — BASIC METABOLIC PANEL
BUN/Creatinine Ratio: 16 (ref 10–24)
BUN: 14 mg/dL (ref 8–27)
CO2: 18 mmol/L — ABNORMAL LOW (ref 20–29)
Calcium: 9.1 mg/dL (ref 8.6–10.2)
Chloride: 107 mmol/L — ABNORMAL HIGH (ref 96–106)
Creatinine, Ser: 0.85 mg/dL (ref 0.76–1.27)
Glucose: 186 mg/dL — ABNORMAL HIGH (ref 70–99)
Potassium: 4 mmol/L (ref 3.5–5.2)
Sodium: 141 mmol/L (ref 134–144)
eGFR: 99 mL/min/{1.73_m2} (ref >59)

## 2023-08-10 LAB — CBC
Hematocrit: 40.2 % (ref 37.5–51.0)
Hemoglobin: 13.8 g/dL (ref 13.0–17.7)
MCH: 32.8 pg (ref 26.6–33.0)
MCHC: 34.3 g/dL (ref 31.5–35.7)
MCV: 96 fL (ref 79–97)
Platelets: 249 10*3/uL (ref 150–450)
RBC: 4.21 x10E6/uL (ref 4.14–5.80)
RDW: 11.7 % (ref 11.6–15.4)
WBC: 8.9 10*3/uL (ref 3.4–10.8)

## 2023-08-12 ENCOUNTER — Other Ambulatory Visit: Payer: Self-pay

## 2023-08-12 ENCOUNTER — Ambulatory Visit
Admission: RE | Admit: 2023-08-12 | Discharge: 2023-08-12 | Disposition: A | Discharge status: Home / Self Care | Payer: Managed Care, Other (non HMO) | Attending: Cardiovascular Disease | Admitting: Cardiovascular Disease

## 2023-08-12 ENCOUNTER — Encounter: Payer: Self-pay | Admitting: Cardiovascular Disease

## 2023-08-12 ENCOUNTER — Encounter
Admission: RE | Disposition: A | Discharge status: Home / Self Care | Payer: Managed Care, Other (non HMO) | Source: Home / Self Care | Attending: Cardiovascular Disease

## 2023-08-12 DIAGNOSIS — I25110 Atherosclerotic heart disease of native coronary artery with unstable angina pectoris: Secondary | ICD-10-CM

## 2023-08-12 DIAGNOSIS — I429 Cardiomyopathy, unspecified: Secondary | ICD-10-CM | POA: Insufficient documentation

## 2023-08-12 DIAGNOSIS — I252 Old myocardial infarction: Secondary | ICD-10-CM | POA: Insufficient documentation

## 2023-08-12 DIAGNOSIS — E785 Hyperlipidemia, unspecified: Secondary | ICD-10-CM | POA: Diagnosis not present

## 2023-08-12 DIAGNOSIS — I25118 Atherosclerotic heart disease of native coronary artery with other forms of angina pectoris: Secondary | ICD-10-CM | POA: Diagnosis present

## 2023-08-12 DIAGNOSIS — Z8673 Personal history of transient ischemic attack (TIA), and cerebral infarction without residual deficits: Secondary | ICD-10-CM | POA: Insufficient documentation

## 2023-08-12 DIAGNOSIS — E119 Type 2 diabetes mellitus without complications: Secondary | ICD-10-CM | POA: Diagnosis not present

## 2023-08-12 DIAGNOSIS — I110 Hypertensive heart disease with heart failure: Secondary | ICD-10-CM | POA: Diagnosis not present

## 2023-08-12 DIAGNOSIS — I255 Ischemic cardiomyopathy: Secondary | ICD-10-CM | POA: Insufficient documentation

## 2023-08-12 DIAGNOSIS — Z955 Presence of coronary angioplasty implant and graft: Secondary | ICD-10-CM | POA: Insufficient documentation

## 2023-08-12 DIAGNOSIS — I5022 Chronic systolic (congestive) heart failure: Secondary | ICD-10-CM | POA: Diagnosis not present

## 2023-08-12 DIAGNOSIS — Z79899 Other long term (current) drug therapy: Secondary | ICD-10-CM | POA: Insufficient documentation

## 2023-08-12 DIAGNOSIS — F17210 Nicotine dependence, cigarettes, uncomplicated: Secondary | ICD-10-CM | POA: Diagnosis not present

## 2023-08-12 DIAGNOSIS — I5020 Unspecified systolic (congestive) heart failure: Secondary | ICD-10-CM

## 2023-08-12 HISTORY — PX: RIGHT/LEFT HEART CATH AND CORONARY ANGIOGRAPHY: CATH118266

## 2023-08-12 LAB — POCT I-STAT 7, (LYTES, BLD GAS, ICA,H+H)
Acid-base deficit: 1 mmol/L (ref 0.0–2.0)
Acid-base deficit: 2 mmol/L (ref 0.0–2.0)
Bicarbonate: 23.4 mmol/L (ref 20.0–28.0)
Bicarbonate: 24.7 mmol/L (ref 20.0–28.0)
Calcium, Ion: 1.19 mmol/L (ref 1.15–1.40)
Calcium, Ion: 1.2 mmol/L (ref 1.15–1.40)
HCT: 35 % — ABNORMAL LOW (ref 39.0–52.0)
HCT: 35 % — ABNORMAL LOW (ref 39.0–52.0)
Hemoglobin: 11.9 g/dL — ABNORMAL LOW (ref 13.0–17.0)
Hemoglobin: 11.9 g/dL — ABNORMAL LOW (ref 13.0–17.0)
O2 Saturation: 70 %
O2 Saturation: 98 %
Potassium: 3.8 mmol/L (ref 3.5–5.1)
Potassium: 3.8 mmol/L (ref 3.5–5.1)
Sodium: 140 mmol/L (ref 135–145)
Sodium: 141 mmol/L (ref 135–145)
TCO2: 25 mmol/L (ref 22–32)
TCO2: 26 mmol/L (ref 22–32)
pCO2 arterial: 40.7 mm[Hg] (ref 32–48)
pCO2 arterial: 44.8 mm[Hg] (ref 32–48)
pH, Arterial: 7.349 — ABNORMAL LOW (ref 7.35–7.45)
pH, Arterial: 7.367 (ref 7.35–7.45)
pO2, Arterial: 104 mm[Hg] (ref 83–108)
pO2, Arterial: 38 mm[Hg] — CL (ref 83–108)

## 2023-08-12 LAB — GLUCOSE, CAPILLARY
Glucose-Capillary: 92 mg/dL (ref 70–99)
Glucose-Capillary: 101 mg/dL — ABNORMAL HIGH (ref 70–99)

## 2023-08-12 SURGERY — RIGHT/LEFT HEART CATH AND CORONARY ANGIOGRAPHY
Anesthesia: Moderate Sedation | Laterality: Bilateral

## 2023-08-12 MED ORDER — FENTANYL CITRATE (PF) 100 MCG/2ML IJ SOLN
INTRAMUSCULAR | Status: DC | PRN
  Administered 2023-08-12: 50 ug via INTRAVENOUS

## 2023-08-12 MED ORDER — SODIUM CHLORIDE 0.9% FLUSH: 3.0000 mL | INTRAVENOUS | Status: DC | PRN

## 2023-08-12 MED ORDER — ACETAMINOPHEN 325 MG PO TABS: 650.0000 mg | ORAL_TABLET | ORAL | Status: DC | PRN

## 2023-08-12 MED ORDER — HEPARIN (PORCINE) IN NACL 2000-0.9 UNIT/L-% IV SOLN
INTRAVENOUS | Status: DC | PRN
  Administered 2023-08-12: 1000 mL

## 2023-08-12 MED ORDER — SODIUM CHLORIDE 0.9% FLUSH: 3.0000 mL | Freq: Two times a day (BID) | INTRAVENOUS | Status: DC

## 2023-08-12 MED ORDER — HEPARIN SODIUM (PORCINE) 1000 UNIT/ML IJ SOLN
INTRAMUSCULAR | Status: DC | PRN
  Administered 2023-08-12: 5000 [IU] via INTRAVENOUS

## 2023-08-12 MED ORDER — HEPARIN SODIUM (PORCINE) 1000 UNIT/ML IJ SOLN
INTRAMUSCULAR | Status: AC
  Filled 2023-08-12: qty 10

## 2023-08-12 MED ORDER — ONDANSETRON HCL 4 MG/2ML IJ SOLN: 4.0000 mg | Freq: Four times a day (QID) | INTRAMUSCULAR | Status: DC | PRN

## 2023-08-12 MED ORDER — SODIUM CHLORIDE 0.9 % IV SOLN: INTRAVENOUS | Status: DC

## 2023-08-12 MED ORDER — LIDOCAINE HCL (PF) 1 % IJ SOLN
INTRAMUSCULAR | Status: DC | PRN
  Administered 2023-08-12: 2 mL

## 2023-08-12 MED ORDER — IOHEXOL 300 MG/ML  SOLN
INTRAMUSCULAR | Status: DC | PRN
  Administered 2023-08-12: 40 mL

## 2023-08-12 MED ORDER — MIDAZOLAM HCL 2 MG/2ML IJ SOLN
INTRAMUSCULAR | Status: DC | PRN
  Administered 2023-08-12: 1 mg via INTRAVENOUS

## 2023-08-12 MED ORDER — VERAPAMIL HCL 2.5 MG/ML IV SOLN
INTRAVENOUS | Status: AC
  Filled 2023-08-12: qty 2

## 2023-08-12 MED ORDER — SODIUM CHLORIDE 0.9 % IV SOLN: 250.0000 mL | INTRAVENOUS | Status: DC | PRN

## 2023-08-12 MED ORDER — ASPIRIN 81 MG PO CHEW: 81.0000 mg | CHEWABLE_TABLET | ORAL | Status: AC

## 2023-08-12 MED ORDER — FENTANYL CITRATE (PF) 100 MCG/2ML IJ SOLN
INTRAMUSCULAR | Status: AC
  Filled 2023-08-12: qty 2

## 2023-08-12 MED ORDER — ASPIRIN 81 MG PO CHEW
CHEWABLE_TABLET | ORAL | Status: AC
  Administered 2023-08-12: 81 mg via ORAL
  Filled 2023-08-12: qty 1

## 2023-08-12 MED ORDER — VERAPAMIL HCL 2.5 MG/ML IV SOLN
INTRAVENOUS | Status: DC | PRN
  Administered 2023-08-12: 2.5 mg via INTRA_ARTERIAL

## 2023-08-12 MED ORDER — MIDAZOLAM HCL 2 MG/2ML IJ SOLN
INTRAMUSCULAR | Status: AC
  Filled 2023-08-12: qty 2

## 2023-08-12 SURGICAL SUPPLY — 13 items
CATH BALLN WEDGE 5F 110CM (CATHETERS) IMPLANT
CATH INFINITI AMBI 5FR JK (CATHETERS) IMPLANT
DEVICE RAD TR BAND REGULAR (VASCULAR PRODUCTS) IMPLANT
DRAPE BRACHIAL (DRAPES) IMPLANT
GLIDESHEATH SLEND SS 6F .021 (SHEATH) IMPLANT
GUIDEWIRE EMER 3M J .025X150CM (WIRE) IMPLANT
GUIDEWIRE INQWIRE 1.5J.035X260 (WIRE) IMPLANT
INQWIRE 1.5J .035X260CM (WIRE) ×1 IMPLANT
PACK CARDIAC CATH (CUSTOM PROCEDURE TRAY) ×1 IMPLANT
PROTECTION STATION PRESSURIZED (MISCELLANEOUS) ×1 IMPLANT
SET ATX-X65L (MISCELLANEOUS) IMPLANT
SHEATH GLIDE SLENDER 4/5FR (SHEATH) IMPLANT
STATION PROTECTION PRESSURIZED (MISCELLANEOUS) IMPLANT

## 2023-08-12 NOTE — Progress Notes (Signed)
TR band off. Patient doing well. VSS, no bleeding.

## 2023-08-12 NOTE — Interval H&P Note (Signed)
History and Physical Interval Note:  08/12/2023 9:22 AM  [name]  has presented today for surgery, with the diagnosis of R and L Cath   Chronic systolic HF.  The various methods of treatment have been discussed with the patient and family. After consideration of risks, benefits and other options for treatment, the patient has consented to  Procedure(s): RIGHT/LEFT HEART CATH AND CORONARY ANGIOGRAPHY (Bilateral) as a surgical intervention.  The patient's history has been reviewed, patient examined, no change in status, stable for surgery.  I have reviewed the patient's chart and labs.  Questions were answered to the patient's satisfaction.     Lorine Bears

## 2023-08-19 ENCOUNTER — Institutional Professional Consult (permissible substitution) (INDEPENDENT_AMBULATORY_CARE_PROVIDER_SITE_OTHER): Payer: Managed Care, Other (non HMO) | Admitting: Thoracic Surgery (Cardiothoracic Vascular Surgery)

## 2023-08-19 ENCOUNTER — Ambulatory Visit: Payer: BC Managed Care – PPO | Admitting: Family Medicine

## 2023-08-19 VITALS — BP 114/70 | HR 94 | Resp 18 | Ht 68.0 in | Wt 217.0 lb

## 2023-08-19 DIAGNOSIS — I25118 Atherosclerotic heart disease of native coronary artery with other forms of angina pectoris: Secondary | ICD-10-CM

## 2023-08-19 NOTE — Progress Notes (Signed)
[address]       [place] [address]             [phone]        Anand Tejada [facility] Medical Record #[id number] Date of Birth: Mar 13, 1961  Referring: Iran Ouch, MD Primary Care: Sallee Provencal, FNP Primary Cardiologist:Muhammad Kirke Corin, MD  Chief Complaint:    Chief Complaint  Patient presents with   Coronary Artery Disease    Cath 1/24, ECHO 1/16    History of Present Illness:     [name] 63 y.o. male presents for surgical evaluation of single-vessel coronary artery disease.  He has a history of PCI with stent placement in the LAD, and recently underwent a heart cath which showed in-stent restenosis.  Is very difficult to determine this patient's symptoms as he says that he is occasionally fatigued, but also experiences locking sensation while trying to sleep in his chest.  He denies any chest pressure or shortness of breath.   Past Medical History:  Diagnosis Date   Anemia 10/03/2009   Qualifier: Diagnosis of  By: Nena Jordan    Anxiety associated with depression 10/11/2013   Colon cancer [facility])    right colon cancer- adenocarcinoma CEA level isnrmal at 1.8   Diabetes mellitus    Diabetes mellitus type 2 in obese 03/20/2010   Qualifier: Diagnosis of  By: Nena Jordan     Erectile dysfunction 06/22/2016   Essential hypertension 06/05/2014   FATTY LIVER DISEASE 11/04/2009   Qualifier: Diagnosis of  By: Marina Goodell MD, Wilhemina Bonito   Qualifier: Diagnosis of  By: Marina Goodell MD, Wilhemina Bonito  Last Assessment & Plan:  conirmed by CT scan of abdomen in April 2016 encouraged to minimize simple carbs and fatty foods.   GERD 11/04/2009   Qualifier: Diagnosis of  By: Marina Goodell MD, Wilhemina Bonito    Great toe pain, right 04/18/2017   Hepatic artery stenosis [facility])    History of colon cancer 01/02/2010   Qualifier: Diagnosis of  By: Nena Jordan Dr Marina Goodell  Last Assessment & Plan:  Follows closely with gastroenterology, Dr Marina Goodell   Hyperlipidemia     Hyperlipidemia, mixed 09/07/2010   Qualifier: Diagnosis of  By: Nena Jordan    Hypertension    Hypertriglyceridemia 12/14/2010   Hypotestosteronism 06/22/2016   INSOMNIA, CHRONIC 03/20/2010   Qualifier: Diagnosis of  By: Nena Jordan Ambien 10 mg daily does not keep asleep AdvilPM is over sedating and has trouble waking up    Iron deficiency anemia 2011   Low testosterone 06/22/2016   Malignant neoplasm of colon (HCC) 01/02/2010   Qualifier: Diagnosis of  By: Nena Jordan Dr Marina Goodell    Muscle spasm of back 06/05/2014   Nipple pain 09/30/2016   Obesity    Palpitations 06/05/2014   Preventative health care 10/08/2012   Psoriasis    Psoriatic arthritis (HCC) 08/25/2017   Right knee pain 11/17/2011   Stroke [facility])     Past Surgical History:  Procedure Laterality Date   BUBBLE STUDY  10/21/2020   Procedure: BUBBLE STUDY;  Surgeon: Chrystie Nose, MD;  Location: [facility] ENDOSCOPY;  Service: Cardiovascular;;   CARPAL TUNNEL RELEASE     RIGHT   CHOLECYSTECTOMY  1994   COLONOSCOPY     CORONARY/GRAFT ACUTE MI REVASCULARIZATION N/A 10/27/2019   Procedure: Coronary/Graft Acute MI Revascularization;  Surgeon: Iran Ouch, MD;  Location: [facility] INVASIVE  CV LAB;  Service: Cardiovascular;  Laterality: N/A;   HEMICOLECTOMY     12/17/2009 right   LEFT HEART CATH AND CORONARY ANGIOGRAPHY N/A 10/27/2019   Procedure: LEFT HEART CATH AND CORONARY ANGIOGRAPHY;  Surgeon: Iran Ouch, MD;  Location: [facility] INVASIVE CV LAB;  Service: Cardiovascular;  Laterality: N/A;   LOOP RECORDER INSERTION N/A 10/21/2020   Procedure: LOOP RECORDER INSERTION;  Surgeon: Hillis Range, MD;  Location: [facility] INVASIVE CV LAB;  Service: Cardiovascular;  Laterality: N/A;   POLYPECTOMY     RIGHT/LEFT HEART CATH AND CORONARY ANGIOGRAPHY Bilateral 08/12/2023   Procedure: RIGHT/LEFT HEART CATH AND CORONARY ANGIOGRAPHY;  Surgeon: Iran Ouch, MD;  Location: [facility] INVASIVE CV LAB;  Service: Cardiovascular;  Laterality:  Bilateral;   TEE WITHOUT CARDIOVERSION N/A 10/21/2020   Procedure: TRANSESOPHAGEAL ECHOCARDIOGRAM (TEE);  Surgeon: Chrystie Nose, MD;  Location: [facility] ENDOSCOPY;  Service: Cardiovascular;  Laterality: N/A;    Social History:  Social History   Tobacco Use  Smoking Status Every Day   Current packs/day: 0.00   Types: Cigarettes   Last attempt to quit: 10/27/2019   Years since quitting: 3.8  Smokeless Tobacco Never  Tobacco Comments   Smokes 1 Pack per week; started back 3 months ago    Social History   Substance and Sexual Activity  Alcohol Use No     Allergies  Allergen Reactions   Clopidogrel Anaphylaxis    "Weird" sensation in legs      Current Outpatient Medications  Medication Sig Dispense Refill   diazepam (VALIUM) 2 MG tablet Take 1 tablet (2 mg total) by mouth daily as needed for anxiety. 30 tablet 0   glucose blood (CONTOUR NEXT TEST) test strip 1 each by Other route 4 (four) times daily - after meals and at bedtime. Use as instructed 100 each 12   Insulin Glargine (BASAGLAR KWIKPEN) 100 UNIT/ML Inject 15 Units into the skin at bedtime. Titrate to Fasting Blood Sugar of 100- 150 mg/dL with morning blood sugar check. (Patient taking differently: Inject 15 Units into the skin at bedtime as needed (high blood glucose). Titrate to Fasting Blood Sugar of 100- 150 mg/dL with morning blood sugar check.) 15 mL 5   Insulin lispro (HUMALOG JUNIOR KWIKPEN) 100 UNIT/ML Inject 5 Units into the skin 3 (three) times daily. 15 mL 0   insulin lispro (HUMALOG) 100 UNIT/ML KwikPen Inject 10 Units into the skin 3 (three) times daily. As directed (Patient taking differently: Inject 0-10 Units into the skin 3 (three) times daily. Sliding scale Above 170 take 4 units) 15 mL 5   lisinopril-hydrochlorothiazide (ZESTORETIC) 20-25 MG tablet Take 1 tablet by mouth daily.     metFORMIN (GLUCOPHAGE-XR) 750 MG 24 hr tablet Take 1 tablet (750 mg total) by mouth 2 (two) times daily after a meal.  (Patient taking differently: Take 750 mg by mouth daily with breakfast.) 180 tablet 3   metoprolol succinate (TOPROL XL) 25 MG 24 hr tablet Take 1 tablet (25 mg total) by mouth once daily. (Patient taking differently: Take 25 mg by mouth 2 (two) times daily.) 90 tablet 3   QUEtiapine (SEROQUEL) 100 MG tablet Take 1 tablet (100 mg total) by mouth at bedtime. 30 tablet 5   rosuvastatin (CRESTOR) 20 MG tablet Take 1 tablet (20 mg total) by mouth every evening. 90 tablet 3   Semaglutide, 2 MG/DOSE, 8 MG/3ML SOPN Inject 2 mg as directed once a week. (Patient taking differently: Inject 1 mg as directed once a week.) 3  mL 11   SIMPONI 50 MG/0.5ML SOAJ Inject 0.5 mLs into the skin every 30 (thirty) days.     ticagrelor (BRILINTA) 60 MG TABS tablet Take 60 mg by mouth 2 (two) times daily.     zolpidem (AMBIEN) 10 MG tablet Take 1 tablet (10 mg total) by mouth at bedtime as needed. 30 tablet 5   No current facility-administered medications for this visit.    (Not in a hospital admission)   Family History  Problem Relation Age of Onset   Stomach cancer Mother        diedinher 57's   Diabetes type II Brother        boderline   Diabetes Brother        type II   Alcohol abuse Brother    Other Neg Hx        cad,prostate ca, colon ca   Coronary artery disease Neg Hx    Cancer Neg Hx        colon, prostate   Colon cancer Neg Hx    Esophageal cancer Neg Hx    Rectal cancer Neg Hx      Review of Systems:   Review of Systems  Constitutional:  Positive for malaise/fatigue.  Respiratory:  Positive for shortness of breath.   Cardiovascular:  Positive for palpitations.  Neurological: Negative.       Physical Exam: BP 114/70   Pulse 94   Resp 18   Ht 5\' 8"  (1.727 m)   Wt 217 lb (98.4 kg)   SpO2 97% Comment: RA  BMI 32.99 kg/m  Physical Exam HENT:     Head: Normocephalic and atraumatic.  Eyes:     Extraocular Movements: Extraocular movements intact.  Cardiovascular:     Rate and  Rhythm: Normal rate.  Abdominal:     General: Abdomen is flat. There is no distension.  Musculoskeletal:        General: Normal range of motion.     Cervical back: Normal range of motion.  Skin:    General: Skin is warm and dry.  Neurological:     General: No focal deficit present.     Mental Status: He is alert and oriented to person, place, and time.       Diagnostic Studies & Laboratory data:    Left Heart Catherization:  Intervention Echo: IMPRESSIONS     1. Left ventricular ejection fraction, by estimation, is 40 to 45%. The  left ventricle has mildly decreased function. The left ventricle  demonstrates regional wall motion abnormalities (severe hypokinesis of the  mid to distal anterior and anteroseptal  wall with thinning of the myocardium). Left ventricular diastolic  parameters are consistent with Grade I diastolic dysfunction (impaired  relaxation).   2. Right ventricular systolic function is normal. The right ventricular  size is normal.   3. The mitral valve is normal in structure. No evidence of mitral valve  regurgitation. No evidence of mitral stenosis.   4. The aortic valve has an indeterminant number of cusps. Aortic valve  regurgitation is not visualized. No aortic stenosis is present.   5. The inferior vena cava is normal in size with greater than 50%  respiratory variability, suggesting right atrial pressure of 3 mmHg.     EKG: Sinus  I have independently reviewed the above radiologic studies and discussed with the patient   Recent Lab Findings: Lab Results  Component Value Date   WBC 8.9 08/09/2023   HGB 11.9 (L) 08/12/2023  HCT 35.0 (L) 08/12/2023   PLT 249 08/09/2023   GLUCOSE 186 (H) 08/09/2023   CHOL 111 05/20/2023   TRIG 130 05/20/2023   HDL 39 (L) 05/20/2023   LDLDIRECT 76.0 03/13/2021   LDLCALC 49 05/20/2023   ALT 15 05/20/2023   AST 19 05/20/2023   NA 141 08/12/2023   K 3.8 08/12/2023   CL 107 (H) 08/09/2023   CREATININE  0.85 08/09/2023   BUN 14 08/09/2023   CO2 18 (L) 08/09/2023   TSH 3.080 05/20/2023   INR 1.0 11/25/2021   HGBA1C 7.0 (H) 05/20/2023      Assessment / Plan:   63 year old male with in-stent restenosis of his LAD along with diagonal disease.  I recommended that he undergo surgery, but we did not have a very good interaction.  He will obtain a second opinion elsewhere     I  spent 60 minutes counseling the patient face to face.   Corliss Skains 08/19/2023 9:47 AM

## 2023-08-26 ENCOUNTER — Ambulatory Visit: Payer: BC Managed Care – PPO | Admitting: Family Medicine

## 2023-08-29 ENCOUNTER — Telehealth: Payer: Self-pay | Admitting: Family Medicine

## 2023-08-29 NOTE — Telephone Encounter (Signed)
 Received a fax from covermymeds for ozempic  4mg /49ml  Key:  [id number]

## 2023-08-31 ENCOUNTER — Telehealth: Payer: Self-pay

## 2023-08-31 ENCOUNTER — Other Ambulatory Visit (HOSPITAL_COMMUNITY): Payer: Self-pay

## 2023-08-31 NOTE — Telephone Encounter (Signed)
Pharmacy Patient Advocate Encounter   Received notification from Pt Calls Messages that prior authorization for Ozempic (2 MG/DOSE) 8MG /3ML pen-injectors is required/requested.   Insurance verification completed.   The patient is insured through [employer] .   Per test claim: PA required; PA submitted to above mentioned insurance via CoverMyMeds Key/confirmation #/EOC [id number] Status is pending

## 2023-09-02 ENCOUNTER — Encounter: Payer: Self-pay | Admitting: Surgery

## 2023-09-02 ENCOUNTER — Other Ambulatory Visit: Payer: Self-pay | Admitting: *Deleted

## 2023-09-02 ENCOUNTER — Institutional Professional Consult (permissible substitution): Payer: Managed Care, Other (non HMO) | Admitting: Surgery

## 2023-09-02 VITALS — BP 144/79 | HR 95 | Resp 18 | Ht 68.0 in | Wt 221.0 lb

## 2023-09-02 DIAGNOSIS — I25118 Atherosclerotic heart disease of native coronary artery with other forms of angina pectoris: Secondary | ICD-10-CM | POA: Diagnosis not present

## 2023-09-02 NOTE — Progress Notes (Signed)
Cardiothoracic Surgery Consultation  PCP is Sallee Provencal, FNP Referring Provider is Iran Ouch, MD  Chief Complaint  Patient presents with   Coronary Artery Disease    Review workup    HPI:  The patient is a 63 year old gentleman with a history of type 2 diabetes, hyperlipidemia, hypertension, previous smoking until December 2024, stroke and coronary artery disease who suffered an anterior STEMI in April 2021 treated with PCI/DES to the LAD.  His LVEF at the time of his MI was 35 to 40%.  Follow-up echo in April 2022 showed normalization of his ejection fraction to 55 to 60%.  He had a nuclear stress test on 07/01/2023 that was abnormal showing a large severe fixed defect involving the mid anterior septal, mid inferoseptal, apical anterior, apical septal, and apical segments consistent with infarct in the LAD territory.  No significant ischemia was identified.  Left ventricular ejection fraction was 51% and was felt to be an intermediate risk study.  He subsequent underwent cardiac catheterization on 08/12/2023 showing significant in-stent restenosis in the LAD up to 80% with the LAD disease extending proximally and distally to the stent.  There is also a large first diagonal branch that was jailed by the stent and had 95% ostial stenosis.  The left circumflex and RCA had no significant disease.  Right heart filling pressures were normal.  Pulm artery pressure and cardiac output were normal.  The patient is here today with his ex fianc.  He reports exertional fatigue and tiredness and has to take breaks when he is exerting himself.  He denies any shortness of breath.  He has had no chest discomfort and no pain in his neck, jaw, or arms.  He denies dizziness and syncope.  Has had no peripheral edema.  He reports a knocking sensation from his heartbeat at night and sometimes when he is exerting himself.  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 Past  Medical History:  Diagnosis Date   Anemia 10/03/2009   Qualifier: Diagnosis of  By: Nena Jordan    Anxiety associated with depression 10/11/2013   Colon cancer [facility])    right colon cancer- adenocarcinoma CEA level isnrmal at 1.8   Diabetes mellitus    Diabetes mellitus type 2 in obese 03/20/2010   Qualifier: Diagnosis of  By: Nena Jordan     Erectile dysfunction 06/22/2016   Essential hypertension 06/05/2014   FATTY LIVER DISEASE 11/04/2009   Qualifier: Diagnosis of  By: Marina Goodell MD, Wilhemina Bonito   Qualifier: Diagnosis of  By: Marina Goodell MD, Wilhemina Bonito  Last Assessment & Plan:  conirmed by CT scan of abdomen in April 2016 encouraged to minimize simple carbs and fatty foods.   GERD 11/04/2009   Qualifier: Diagnosis of  By: Marina Goodell MD, Wilhemina Bonito    Great toe pain, right 04/18/2017   Hepatic artery stenosis [facility])    History of colon cancer 01/02/2010   Qualifier: Diagnosis of  By: Nena Jordan Dr Marina Goodell  Last Assessment & Plan:  Follows closely with gastroenterology, Dr Marina Goodell   Hyperlipidemia    Hyperlipidemia, mixed 09/07/2010   Qualifier: Diagnosis of  By: Nena Jordan    Hypertension    Hypertriglyceridemia 12/14/2010   Hypotestosteronism 06/22/2016   INSOMNIA, CHRONIC 03/20/2010   Qualifier: Diagnosis of  By: Nena Jordan Ambien 10 mg daily does not keep asleep AdvilPM is over sedating and has trouble waking up  Iron deficiency anemia 2011   Low testosterone 06/22/2016   Malignant neoplasm of colon (HCC) 01/02/2010   Qualifier: Diagnosis of  By: Nena Jordan Dr Marina Goodell    Muscle spasm of back 06/05/2014   Nipple pain 09/30/2016   Obesity    Palpitations 06/05/2014   Preventative health care 10/08/2012   Psoriasis    Psoriatic arthritis (HCC) 08/25/2017   Right knee pain 11/17/2011   Stroke [facility])     Past Surgical History:  Procedure Laterality Date   BUBBLE STUDY  10/21/2020   Procedure: BUBBLE STUDY;  Surgeon: Chrystie Nose, MD;  Location: [facility] ENDOSCOPY;   Service: Cardiovascular;;   CARPAL TUNNEL RELEASE     RIGHT   CHOLECYSTECTOMY  1994   COLONOSCOPY     CORONARY/GRAFT ACUTE MI REVASCULARIZATION N/A 10/27/2019   Procedure: Coronary/Graft Acute MI Revascularization;  Surgeon: Iran Ouch, MD;  Location: [facility] INVASIVE CV LAB;  Service: Cardiovascular;  Laterality: N/A;   HEMICOLECTOMY     12/17/2009 right   LEFT HEART CATH AND CORONARY ANGIOGRAPHY N/A 10/27/2019   Procedure: LEFT HEART CATH AND CORONARY ANGIOGRAPHY;  Surgeon: Iran Ouch, MD;  Location: [facility] INVASIVE CV LAB;  Service: Cardiovascular;  Laterality: N/A;   LOOP RECORDER INSERTION N/A 10/21/2020   Procedure: LOOP RECORDER INSERTION;  Surgeon: Hillis Range, MD;  Location: [facility] INVASIVE CV LAB;  Service: Cardiovascular;  Laterality: N/A;   POLYPECTOMY     RIGHT/LEFT HEART CATH AND CORONARY ANGIOGRAPHY Bilateral 08/12/2023   Procedure: RIGHT/LEFT HEART CATH AND CORONARY ANGIOGRAPHY;  Surgeon: Iran Ouch, MD;  Location: [facility] INVASIVE CV LAB;  Service: Cardiovascular;  Laterality: Bilateral;   TEE WITHOUT CARDIOVERSION N/A 10/21/2020   Procedure: TRANSESOPHAGEAL ECHOCARDIOGRAM (TEE);  Surgeon: Chrystie Nose, MD;  Location: [facility] ENDOSCOPY;  Service: Cardiovascular;  Laterality: N/A;    Family History  Problem Relation Age of Onset   Stomach cancer Mother        diedinher 47's   Diabetes type II Brother        boderline   Diabetes Brother        type II   Alcohol abuse Brother    Other Neg Hx        cad,prostate ca, colon ca   Coronary artery disease Neg Hx    Cancer Neg Hx        colon, prostate   Colon cancer Neg Hx    Esophageal cancer Neg Hx    Rectal cancer Neg Hx     Social History Social History   Tobacco Use   Smoking status: Every Day    Current packs/day: 0.00    Types: Cigarettes    Last attempt to quit: 10/27/2019    Years since quitting: 3.8   Smokeless tobacco: Never   Tobacco comments:    Smokes 1 Pack per week; started back 3 months ago   Vaping Use   Vaping status: Never Used  Substance Use Topics   Alcohol use: No   Drug use: No    Current Outpatient Medications  Medication Sig Dispense Refill   diazepam (VALIUM) 2 MG tablet Take 1 tablet (2 mg total) by mouth daily as needed for anxiety. 30 tablet 0   glucose blood (CONTOUR NEXT TEST) test strip 1 each by Other route 4 (four) times daily - after meals and at bedtime. Use as instructed 100 each 12   Insulin Glargine (BASAGLAR KWIKPEN) 100 UNIT/ML Inject 15 Units into the skin at bedtime. Titrate  to Fasting Blood Sugar of 100- 150 mg/dL with morning blood sugar check. (Patient taking differently: Inject 15 Units into the skin at bedtime as needed (high blood glucose). Titrate to Fasting Blood Sugar of 100- 150 mg/dL with morning blood sugar check.) 15 mL 5   Insulin lispro (HUMALOG JUNIOR KWIKPEN) 100 UNIT/ML Inject 5 Units into the skin 3 (three) times daily. 15 mL 0   insulin lispro (HUMALOG) 100 UNIT/ML KwikPen Inject 10 Units into the skin 3 (three) times daily. As directed (Patient taking differently: Inject 0-10 Units into the skin 3 (three) times daily. Sliding scale Above 170 take 4 units) 15 mL 5   lisinopril-hydrochlorothiazide (ZESTORETIC) 20-25 MG tablet Take 1 tablet by mouth daily.     metFORMIN (GLUCOPHAGE-XR) 750 MG 24 hr tablet Take 1 tablet (750 mg total) by mouth 2 (two) times daily after a meal. (Patient taking differently: Take 750 mg by mouth daily with breakfast.) 180 tablet 3   metoprolol succinate (TOPROL XL) 25 MG 24 hr tablet Take 1 tablet (25 mg total) by mouth once daily. (Patient taking differently: Take 25 mg by mouth 2 (two) times daily.) 90 tablet 3   QUEtiapine (SEROQUEL) 100 MG tablet Take 1 tablet (100 mg total) by mouth at bedtime. 30 tablet 5   rosuvastatin (CRESTOR) 20 MG tablet Take 1 tablet (20 mg total) by mouth every evening. 90 tablet 3   Semaglutide, 2 MG/DOSE, 8 MG/3ML SOPN Inject 2 mg as directed once a week. (Patient taking  differently: Inject 1 mg as directed once a week.) 3 mL 11   SIMPONI 50 MG/0.5ML SOAJ Inject 0.5 mLs into the skin every 30 (thirty) days.     ticagrelor (BRILINTA) 60 MG TABS tablet Take 60 mg by mouth 2 (two) times daily.     zolpidem (AMBIEN) 10 MG tablet Take 1 tablet (10 mg total) by mouth at bedtime as needed. 30 tablet 5   No current facility-administered medications for this visit.    Allergies  Allergen Reactions   Clopidogrel Anaphylaxis    "Weird" sensation in legs     Review of Systems  Constitutional:  Positive for activity change and fatigue.  HENT: Negative.    Eyes: Negative.   Respiratory:  Negative for chest tightness and shortness of breath.   Cardiovascular:  Positive for palpitations. Negative for chest pain and leg swelling.  Gastrointestinal: Negative.   Endocrine: Negative.   Genitourinary: Negative.   Musculoskeletal: Negative.   Allergic/Immunologic: Negative.   Neurological:  Negative for dizziness and syncope.  Hematological: Negative.   Psychiatric/Behavioral: Negative.      BP (!) 144/79 (BP Location: Left Arm)   Pulse 95   Resp 18   Ht 5\' 8"  (1.727 m)   Wt 221 lb (100.2 kg)   SpO2 96% Comment: RA  BMI 33.60 kg/m  Physical Exam Constitutional:      Appearance: Normal appearance. He is obese.  HENT:     Head: Normocephalic and atraumatic.  Eyes:     Extraocular Movements: Extraocular movements intact.     Conjunctiva/sclera: Conjunctivae normal.     Pupils: Pupils are equal, round, and reactive to light.  Neck:     Vascular: No carotid bruit.  Cardiovascular:     Rate and Rhythm: Normal rate and regular rhythm.     Pulses: Normal pulses.     Heart sounds: Normal heart sounds. No murmur heard. Pulmonary:     Effort: Pulmonary effort is normal.     Breath  sounds: Normal breath sounds.  Abdominal:     General: There is no distension.     Tenderness: There is no abdominal tenderness.  Musculoskeletal:        General: No swelling.      Cervical back: Normal range of motion.  Skin:    General: Skin is warm and dry.  Neurological:     General: No focal deficit present.     Mental Status: He is alert and oriented to person, place, and time.  Psychiatric:        Mood and Affect: Mood normal.        Behavior: Behavior normal.      Diagnostic Tests:  ECHOCARDIOGRAM REPORT       Patient Name:   [name] Date of Exam: 08/04/2023  Medical Rec #:  [id number]      Height:       68.0 in  Accession #:    [id number]     Weight:       225.4 lb  Date of Birth:  1961-05-31      BSA:          2.150 m  Patient Age:    62 years       BP:           111/57 mmHg  Patient Gender: M              HR:           100 bpm.  Exam Location:  [place]   Procedure: 2D Echo, Cardiac Doppler, Color Doppler and Intracardiac             Opacification Agent   Indications:    I50.33 Acute on chronic diastolic (congestive) heart  failure    History:       Patient has prior history of Echocardiogram examinations.  CHF,                 CAD and Previous Myocardial Infarction, Pacemaker, TIA and                  Stroke, Signs/Symptoms:Chest Pain; Risk  Factors:Hypertension and                  Dyslipidemia.    Sonographer:    Ilda Mori MHA, BS, RDCS  Referring Phys: [facility]     Sonographer Comments: Technically difficult study due to poor echo windows  and patient is obese. Image acquisition challenging due to patient body  habitus.  IMPRESSIONS     1. Left ventricular ejection fraction, by estimation, is 40 to 45%. The  left ventricle has mildly decreased function. The left ventricle  demonstrates regional wall motion abnormalities (severe hypokinesis of the  mid to distal anterior and anteroseptal  wall with thinning of the myocardium). Left ventricular diastolic  parameters are consistent with Grade I diastolic dysfunction (impaired  relaxation).   2. Right ventricular systolic function is normal. The  right ventricular  size is normal.   3. The mitral valve is normal in structure. No evidence of mitral valve  regurgitation. No evidence of mitral stenosis.   4. The aortic valve has an indeterminant number of cusps. Aortic valve  regurgitation is not visualized. No aortic stenosis is present.   5. The inferior vena cava is normal in size with greater than 50%  respiratory variability, suggesting right atrial pressure of 3 mmHg.   FINDINGS   Left Ventricle: Left ventricular ejection fraction, by  estimation, is 40  to 45%. The left ventricle has mildly decreased function. The left  ventricle demonstrates regional wall motion abnormalities. The left  ventricular internal cavity size was normal  in size. There is no left ventricular hypertrophy. Left ventricular  diastolic parameters are consistent with Grade I diastolic dysfunction  (impaired relaxation).   Right Ventricle: The right ventricular size is normal. No increase in  right ventricular wall thickness. Right ventricular systolic function is  normal.   Left Atrium: Left atrial size was normal in size.   Right Atrium: Right atrial size was normal in size.   Pericardium: There is no evidence of pericardial effusion.   Mitral Valve: The mitral valve is normal in structure. No evidence of  mitral valve regurgitation. No evidence of mitral valve stenosis.   Tricuspid Valve: The tricuspid valve is normal in structure. Tricuspid  valve regurgitation is not demonstrated. No evidence of tricuspid  stenosis.   Aortic Valve: The aortic valve has an indeterminant number of cusps.  Aortic valve regurgitation is not visualized. No aortic stenosis is  present. Aortic valve mean gradient measures 3.0 mmHg. Aortic valve peak  gradient measures 6.4 mmHg.   Pulmonic Valve: The pulmonic valve was normal in structure. Pulmonic valve  regurgitation is not visualized. No evidence of pulmonic stenosis.   Aorta: The aortic root is normal in  size and structure.   Venous: The inferior vena cava is normal in size with greater than 50%  respiratory variability, suggesting right atrial pressure of 3 mmHg.   IAS/Shunts: No atrial level shunt detected by color flow Doppler.     LEFT VENTRICLE  PLAX 2D  LVIDd:         4.80 cm Diastology  LVIDs:         3.20 cm LV e' medial:    7.83 cm/s  LV PW:         0.90 cm LV E/e' medial:  6.4  LV IVS:        0.90 cm LV e' lateral:   7.62 cm/s                         LV E/e' lateral: 6.6     LEFT ATRIUM           Index        RIGHT ATRIUM           Index  LA diam:      4.20 cm 1.95 cm/m   RA Area:     16.90 cm  LA Vol (A4C): 62.1 ml 28.88 ml/m  RA Volume:   38.10 ml  17.72 ml/m   AORTIC VALVE  AV Vmax:           126.00 cm/s  AV Vmean:          85.300 cm/s  AV VTI:            0.200 m  AV Peak Grad:      6.4 mmHg  AV Mean Grad:      3.0 mmHg  LVOT Vmax:         104.00 cm/s  LVOT Vmean:        70.700 cm/s  LVOT VTI:          0.168 m  LVOT/AV VTI ratio: 0.84    AORTA  Ao Root diam: 3.40 cm  Ao Asc diam:  3.50 cm   MITRAL VALVE  MV Area (PHT): 5.13 cm  SHUNTS  MV Decel Time: 148 msec    Systemic VTI: 0.17 m  MV E velocity: 50.30 cm/s  MV A velocity: 66.60 cm/s  MV E/A ratio:  0.76   Julien Nordmann MD  Electronically signed by Julien Nordmann MD  Signature Date/Time: 08/04/2023/5:51:32 PM        Final      Physicians  Panel Physicians Referring Physician Case Authorizing Physician  Iran Ouch, MD (Primary)     Procedures  RIGHT/LEFT HEART CATH AND CORONARY ANGIOGRAPHY   Conclusion      Mid RCA lesion is 40% stenosed.   Prox LAD lesion is 60% stenosed.   Prox LAD to Mid LAD lesion is 80% stenosed.   1st Diag-2 lesion is 40% stenosed.   Mid LAD lesion is 50% stenosed.   1st Diag-1 lesion is 95% stenosed.   1.  Significant in-stent restenosis in the LAD.  In addition, the LAD disease extends outside the stent proximally and distally.  In addition, the  large first diagonal which was jailed by the stent has 95% ostial stenosis. 2.  Left ventricular angiography was not performed.  EF was mildly reduced by echo. 3.  Right heart catheterization showed normal filling pressures, normal pulmonary pressure and normal cardiac output.   Recommendations: Due to diffuse in-stent restenosis, extension of the disease outside of the stented segment as well as the involvement of a large diagonal branch, I recommend evaluation for two-vessel CABG especially that he is diabetic. Brilinta can be stopped once surgery is scheduled.   Indications  Coronary artery disease involving native coronary artery of native heart with other form of angina pectoris (HCC) [I25.118 (ICD-10-CM)]  Chronic systolic congestive heart failure (HCC) [Z61.09 (ICD-10-CM)]   Procedural Details  Technical Details Procedural Details: The right antecubital area was prepped in a sterile fashion.  It was anesthetized with 1% lidocaine.  Ultrasound was used to identify the right antecubital vein which was patent.  Under direct ultrasound visualization, a 4 French slender sheath was placed.  Ultrasound guidance was also used for right radial artery access and placement of a 5 French slender sheath. 2.5 mg of verapamil was administered through the sheath, weight-based unfractionated heparin was administered intravenously. Right heart catheterization was performed using a 5 French Swan-Ganz catheter. Cardiac output was calculated by the Fick method. A Jackie catheter was used for selective coronary angiography and LV pressure. There were no immediate procedural complications. A TR band was used for radial hemostasis at the completion of the procedure.  The patient was transferred to the post catheterization recovery area for further monitoring.    Estimated blood loss <50 mL.   During this procedure medications were administered to achieve and maintain moderate conscious sedation while the  patient's heart rate, blood pressure, and oxygen saturation were continuously monitored and I was present face-to-face 100% of this time. Christianne Borrow Cardiovascular Specialist and Isac Sarna Lab Personnel are independent, trained observers who assisted in the monitoring of the patient's level of consciousness.   Medications (Filter: Administrations occurring from 0903 to 1013 on 08/12/23)  important  Continuous medications are totaled by the amount administered until 08/12/23 1013.   fentaNYL (SUBLIMAZE) injection (mcg)  Total dose: 50 mcg Date/Time Rate/Dose/Volume Action   08/12/23 0924 50 mcg Given   midazolam (VERSED) injection (mg)  Total dose: 1 mg Date/Time Rate/Dose/Volume Action   08/12/23 0924 1 mg Given   Heparin (Porcine) in NaCl 2000-0.9 UNIT/L-% SOLN (mL)  Total volume: 1,000 mL Date/Time  Rate/Dose/Volume Action   08/12/23 0924 1,000 mL Given   lidocaine (PF) (XYLOCAINE) 1 % injection (mL)  Total volume: 2 mL Date/Time Rate/Dose/Volume Action   08/12/23 0925 2 mL Given   verapamil (ISOPTIN) injection (mg)  Total dose: 2.5 mg Date/Time Rate/Dose/Volume Action   08/12/23 0932 2.5 mg Given   heparin sodium (porcine) injection (Units)  Total dose: 5,000 Units Date/Time Rate/Dose/Volume Action   08/12/23 0941 5,000 Units Given   iohexol (OMNIPAQUE) 300 MG/ML solution (mL)  Total volume: 40 mL Date/Time Rate/Dose/Volume Action   08/12/23 0954 40 mL Given    Sedation Time  Sedation Time Physician-1: 32 minutes 5 seconds Contrast     Administrations occurring from 0903 to 1013 on 08/12/23:  Medication Name Total Dose  iohexol (OMNIPAQUE) 300 MG/ML solution 40 mL   Radiation/Fluoro  Fluoro time: 3.4 (min) DAP: 28.2 (Gycm2) Cumulative Air Kerma: 421 (mGy) Complications  Complications documented before study signed (08/12/2023 11:58 AM)   No complications were associated with this study.  Documented by Isac Sarna - 08/12/2023  9:49 AM     Coronary  Findings  Diagnostic Dominance: Co-dominant Left Main  Vessel is angiographically normal.    Left Anterior Descending  Prox LAD lesion is 60% stenosed.  Prox LAD to Mid LAD lesion is 80% stenosed. The lesion is located at the major branch. The lesion is mildly calcified. The lesion was previously treated using a drug eluting stent over 2 years ago.  Mid LAD lesion is 50% stenosed.    First Diagonal Branch  Vessel is large in size.  1st Diag-1 lesion is 95% stenosed. The lesion is thrombotic.  1st Diag-2 lesion is 40% stenosed. The lesion is thrombotic.    Left Circumflex  The vessel exhibits minimal luminal irregularities.    Left Posterior Atrioventricular Artery  The vessel exhibits minimal luminal irregularities.    Right Coronary Artery  Mid RCA lesion is 40% stenosed.    Intervention   No interventions have been documented.   Coronary Diagrams  Diagnostic Dominance: Co-dominant  Intervention   Implants   No implant documentation for this case.   Syngo Images   Show images for CARDIAC CATHETERIZATION Images on Long Term Storage   Show images for Jaquise, Faux "Richie" Link to Procedure Log  Procedure Log    Hemo Data  Flowsheet Row Most Recent Value  Fick Cardiac Output 6.13 L/min  Fick Cardiac Output Index 2.94 (L/min)/BSA  RA A Wave 11 mmHg  RA V Wave 10 mmHg  RA Mean 9 mmHg  RV Systolic Pressure 27 mmHg  RV Diastolic Pressure 4 mmHg  RV EDP 9 mmHg  PA Systolic Pressure 24 mmHg  PA Diastolic Pressure 10 mmHg  PA Mean 17 mmHg  PW A Wave 16 mmHg  PW V Wave 15 mmHg  PW Mean 13 mmHg  AO Systolic Pressure 0 mmHg  AO Diastolic Pressure 0 mmHg  AO Mean 0 mmHg  LV Systolic Pressure 121 mmHg  LV Diastolic Pressure -4 mmHg  LV EDP 11 mmHg  Arterial Occlusion Pressure Extended Systolic Pressure 127 mmHg  Arterial Occlusion Pressure Extended Diastolic Pressure 70 mmHg  Arterial Occlusion Pressure Extended Mean Pressure 97 mmHg  Left Ventricular  Apex Extended Systolic Pressure 126 mmHg  LVp Diastolic Pressure -3 mmHg  Left Ventricular Apex Extended EDP Pressure 20 mmHg  QP/QS 1  TPVR Index 5.79 HRUI   Impression:  This 63 year old gentleman has severe LAD/diagonal disease due to in-stent restenosis in a heavily calcified vessel.  His distal LAD and diagonal branch appear graftable.  His LV systolic function did normalize 1 year after his infarct in 2021 and my hope would be that his current decreased function is reversible.  He has developed progressive exertional fatigue and tiredness which is likely ischemia mediated.  I think the best treatment is to proceed with coronary bypass graft surgery to the LAD and diagonal branch. I discussed the operative procedure with the patient and his ex-fiance including alternatives, benefits and risks; including but not limited to bleeding, blood transfusion, infection, stroke, myocardial infarction, graft failure, heart block requiring a permanent pacemaker, organ dysfunction, and death.  Carin Hock understands and agrees to proceed.     Plan:  He will be scheduled for coronary bypass graft surgery on Monday, September 19, 2023.  He will need to stop Brilinta 5 days preoperatively.  He will also need to stop his Ozempic 1 week preoperatively and metformin 2 days preoperatively.  I spent 45 minutes performing this consultation and > 50% of this time was spent face to face counseling and coordinating the care of this patient's severe single-vessel coronary disease.  Alleen Borne, MD [facility] [phone]

## 2023-09-05 ENCOUNTER — Encounter: Payer: Self-pay | Admitting: *Deleted

## 2023-09-05 ENCOUNTER — Other Ambulatory Visit: Payer: Self-pay | Admitting: *Deleted

## 2023-09-06 ENCOUNTER — Other Ambulatory Visit (HOSPITAL_COMMUNITY): Payer: Self-pay

## 2023-09-06 NOTE — Telephone Encounter (Signed)
Pharmacy Patient Advocate Encounter  Received notification from [employer] that Prior Authorization for Ozempic (2 MG/DOSE) 8MG /3ML pen-injectors has been APPROVED from 09/06/23 to 09/05/24. Ran test claim, Copay is $0. This test claim was processed through [facility]- copay amounts may vary at other pharmacies due to pharmacy/plan contracts, or as the patient moves through the different stages of their insurance plan.   PA #/Case ID/Reference #: [id number]  *spoke with [facility] to process

## 2023-09-09 ENCOUNTER — Ambulatory Visit: Payer: BC Managed Care – PPO | Admitting: Family Medicine

## 2023-09-12 ENCOUNTER — Telehealth: Payer: Self-pay | Admitting: Family Medicine

## 2023-09-12 ENCOUNTER — Ambulatory Visit (INDEPENDENT_AMBULATORY_CARE_PROVIDER_SITE_OTHER): Payer: BC Managed Care – PPO

## 2023-09-12 DIAGNOSIS — I639 Cerebral infarction, unspecified: Secondary | ICD-10-CM | POA: Diagnosis not present

## 2023-09-12 NOTE — Telephone Encounter (Signed)
 [facility] pharmacy is requesting prior authorization  Key: [employer] 2 Sensor

## 2023-09-13 ENCOUNTER — Encounter: Payer: Self-pay | Admitting: Cardiology

## 2023-09-13 ENCOUNTER — Ambulatory Visit: Payer: Managed Care, Other (non HMO) | Admitting: Cardiovascular Disease

## 2023-09-13 ENCOUNTER — Other Ambulatory Visit: Payer: Self-pay | Admitting: Family Medicine

## 2023-09-13 DIAGNOSIS — E1139 Type 2 diabetes mellitus with other diabetic ophthalmic complication: Secondary | ICD-10-CM

## 2023-09-13 LAB — CUP PACEART REMOTE DEVICE CHECK
Date Time Interrogation Session: 20250223230402
Implantable Pulse Generator Implant Date: 20220405

## 2023-09-13 MED ORDER — FREESTYLE LIBRE 3 PLUS SENSOR MISC: 6 refills | Status: DC

## 2023-09-13 NOTE — Telephone Encounter (Signed)
 Ordered Freestyle Libre 3 Plus for pt.

## 2023-09-14 NOTE — Pre-Procedure Instructions (Signed)
 Surgical Instructions   Your procedure is scheduled on Monday, March 3rd. Report to [facility] Main Entrance "A" at 05:30 A.M., then check in with the Admitting office. Any questions or running late day of surgery: call [phone]  Questions prior to your surgery date: call [phone], Monday-Friday, 8am-4pm. If you experience any cold or flu symptoms such as cough, fever, chills, shortness of breath, etc. between now and your scheduled surgery, please notify us at the above number.     Remember:  Do not eat or drink after midnight the night before your surgery     Take these medicines the morning of surgery with A SIP OF WATER  metoprolol succinate (TOPROL XL)    May take these medicines IF NEEDED: diazepam (VALIUM)    One week prior to surgery, STOP taking any Aspirin (unless otherwise instructed by your surgeon) Aleve, Naproxen, Ibuprofen, Motrin, Advil, Goody's, BC's, all herbal medications, fish oil, and non-prescription vitamins.   WHAT DO I DO ABOUT MY DIABETES MEDICATION?   **STOP METFORMIN 2 DAYS PRIOR TO SURGERY**  THE NIGHT BEFORE SURGERY, take 7.5 units (50%) of Insulin Glargine (BASAGLAR KWIKPEN).     STOP taking Semaglutide 7 days prior to surgery. Last dose 2/23.  **STOP BRILINTA 5 DAYS PRIOR TO SURGERY**  If your CBG is greater than 220 mg/dL, you may take  of your sliding scale (correction) dose of insulin lispro (HUMALOG).   HOW TO MANAGE YOUR DIABETES BEFORE AND AFTER SURGERY  Why is it important to control my blood sugar before and after surgery? Improving blood sugar levels before and after surgery helps healing and can limit problems. A way of improving blood sugar control is eating a healthy diet by:  Eating less sugar and carbohydrates  Increasing activity/exercise  Talking with your doctor about reaching your blood sugar goals High blood sugars (greater than 180 mg/dL) can raise your risk of infections and slow your recovery, so you will  need to focus on controlling your diabetes during the weeks before surgery. Make sure that the doctor who takes care of your diabetes knows about your planned surgery including the date and location.  How do I manage my blood sugar before surgery? Check your blood sugar at least 4 times a day, starting 2 days before surgery, to make sure that the level is not too high or low.  Check your blood sugar the morning of your surgery when you wake up and every 2 hours until you get to the Short Stay unit.  If your blood sugar is less than 70 mg/dL, you will need to treat for low blood sugar: Do not take insulin. Treat a low blood sugar (less than 70 mg/dL) with  cup of clear juice (cranberry or apple), 4 glucose tablets, OR glucose gel. Recheck blood sugar in 15 minutes after treatment (to make sure it is greater than 70 mg/dL). If your blood sugar is not greater than 70 mg/dL on recheck, call [phone] for further instructions. Report your blood sugar to the short stay nurse when you get to Short Stay.  If you are admitted to the hospital after surgery: Your blood sugar will be checked by the staff and you will probably be given insulin after surgery (instead of oral diabetes medicines) to make sure you have good blood sugar levels. The goal for blood sugar control after surgery is 80-180 mg/dL.                    Do  NOT Smoke (Tobacco/Vaping) for 24 hours prior to your procedure.  If you use a CPAP at night, you may bring your mask/headgear for your overnight stay.   You will be asked to remove any contacts, glasses, piercing's, hearing aid's, dentures/partials prior to surgery. Please bring cases for these items if needed.    Patients discharged the day of surgery will not be allowed to drive home, and someone needs to stay with them for 24 hours.  SURGICAL WAITING ROOM VISITATION Patients may have no more than 2 support people in the waiting area - these visitors may rotate.   Pre-op  nurse will coordinate an appropriate time for 1 ADULT support person, who may not rotate, to accompany patient in pre-op.  Children under the age of 53 must have an adult with them who is not the patient and must remain in the main waiting area with an adult.  If the patient needs to stay at the hospital during part of their recovery, the visitor guidelines for inpatient rooms apply.  Please refer to the [facility] website for the visitor guidelines for any additional information.   If you received a COVID test during your pre-op visit  it is requested that you wear a mask when out in public, stay away from anyone that may not be feeling well and notify your surgeon if you develop symptoms. If you have been in contact with anyone that has tested positive in the last 10 days please notify you surgeon.      Pre-operative CHG Bathing Instructions   You can play a key role in reducing the risk of infection after surgery. Your skin needs to be as free of germs as possible. You can reduce the number of germs on your skin by washing with CHG (chlorhexidine gluconate) soap before surgery. CHG is an antiseptic soap that kills germs and continues to kill germs even after washing.   DO NOT use if you have an allergy to chlorhexidine/CHG or antibacterial soaps. If your skin becomes reddened or irritated, stop using the CHG and notify one of our RNs at [phone].              TAKE A SHOWER THE NIGHT BEFORE SURGERY AND THE DAY OF SURGERY    Please keep in mind the following:  DO NOT shave, including legs and underarms, 48 hours prior to surgery.   You may shave your face before/day of surgery.  Place clean sheets on your bed the night before surgery Use a clean washcloth (not used since being washed) for each shower. DO NOT sleep with pet's night before surgery.  CHG Shower Instructions:  Wash your face and private area with normal soap. If you choose to wash your hair, wash first with your normal  shampoo.  After you use shampoo/soap, rinse your hair and body thoroughly to remove shampoo/soap residue.  Turn the water OFF and apply half the bottle of CHG soap to a CLEAN washcloth.  Apply CHG soap ONLY FROM YOUR NECK DOWN TO YOUR TOES (washing for 3-5 minutes)  DO NOT use CHG soap on face, private areas, open wounds, or sores.  Pay special attention to the area where your surgery is being performed.  If you are having back surgery, having someone wash your back for you may be helpful. Wait 2 minutes after CHG soap is applied, then you may rinse off the CHG soap.  Pat dry with a clean towel  Put on clean pajamas  Additional instructions for the day of surgery: DO NOT APPLY any lotions, deodorants, cologne, or perfumes.   Do not wear jewelry or makeup Do not wear nail polish, gel polish, artificial nails, or any other type of covering on natural nails (fingers and toes) Do not bring valuables to the hospital. [facility] is not responsible for valuables/personal belongings. Put on clean/comfortable clothes.  Please brush your teeth.  Ask your nurse before applying any prescription medications to the skin.

## 2023-09-15 ENCOUNTER — Ambulatory Visit (HOSPITAL_COMMUNITY)
Admission: RE | Admit: 2023-09-15 | Discharge: 2023-09-15 | Disposition: A | Discharge status: Home / Self Care | Payer: Managed Care, Other (non HMO) | Source: Ambulatory Visit | Attending: Surgery | Admitting: Surgery

## 2023-09-15 ENCOUNTER — Other Ambulatory Visit: Payer: Self-pay

## 2023-09-15 ENCOUNTER — Encounter (HOSPITAL_COMMUNITY)
Admission: RE | Admit: 2023-09-15 | Discharge: 2023-09-15 | Disposition: A | Discharge status: Home / Self Care | Payer: Managed Care, Other (non HMO) | Source: Ambulatory Visit | Attending: Surgery | Admitting: Surgery

## 2023-09-15 ENCOUNTER — Encounter (HOSPITAL_COMMUNITY): Payer: Self-pay

## 2023-09-15 VITALS — BP 121/71 | HR 85 | Temp 97.8°F | Ht 68.0 in | Wt 220.8 lb

## 2023-09-15 DIAGNOSIS — I25118 Atherosclerotic heart disease of native coronary artery with other forms of angina pectoris: Secondary | ICD-10-CM | POA: Diagnosis present

## 2023-09-15 DIAGNOSIS — Z01818 Encounter for other preprocedural examination: Secondary | ICD-10-CM | POA: Insufficient documentation

## 2023-09-15 HISTORY — DX: Atherosclerotic heart disease of native coronary artery without angina pectoris: I25.10

## 2023-09-15 LAB — URINALYSIS, ROUTINE W REFLEX MICROSCOPIC
Bacteria, UA: NONE SEEN
Bilirubin Urine: NEGATIVE
Glucose, UA: >=500 mg/dL — AB
Hgb urine dipstick: NEGATIVE
Ketones, ur: NEGATIVE mg/dL
Leukocytes,Ua: NEGATIVE
Nitrite: NEGATIVE
Protein, ur: NEGATIVE mg/dL
Specific Gravity, Urine: 1.017 (ref 1.005–1.030)
pH: 5 (ref 5.0–8.0)

## 2023-09-15 LAB — SURGICAL PCR SCREEN
MRSA, PCR: NEGATIVE
Staphylococcus aureus: NEGATIVE

## 2023-09-15 LAB — CBC
HCT: 42.4 % (ref 39.0–52.0)
Hemoglobin: 14.8 g/dL (ref 13.0–17.0)
MCH: 32.3 pg (ref 26.0–34.0)
MCHC: 34.9 g/dL (ref 30.0–36.0)
MCV: 92.6 fL (ref 80.0–100.0)
Platelets: 262 10*3/uL (ref 150–400)
RBC: 4.58 MIL/uL (ref 4.22–5.81)
RDW: 11.9 % (ref 11.5–15.5)
WBC: 10.2 10*3/uL (ref 4.0–10.5)
nRBC: 0 % (ref 0.0–0.2)

## 2023-09-15 LAB — COMPREHENSIVE METABOLIC PANEL
ALT: 18 U/L (ref 0–44)
AST: 20 U/L (ref 15–41)
Albumin: 3.8 g/dL (ref 3.5–5.0)
Alkaline Phosphatase: 51 U/L (ref 38–126)
Anion gap: 11 (ref 5–15)
BUN: 14 mg/dL (ref 6–20)
CO2: 22 mmol/L (ref 22–32)
Calcium: 9.2 mg/dL (ref 8.9–10.3)
Chloride: 103 mmol/L (ref 98–111)
Creatinine, Ser: 0.9 mg/dL (ref 0.61–1.24)
GFR, Estimated: >60 mL/min (ref >60)
Glucose, Bld: 127 mg/dL — ABNORMAL HIGH (ref 70–99)
Potassium: 4.1 mmol/L (ref 3.5–5.1)
Sodium: 136 mmol/L (ref 135–145)
Total Bilirubin: 1.2 mg/dL (ref 0.0–1.2)
Total Protein: 7.1 g/dL (ref 6.5–8.1)

## 2023-09-15 LAB — APTT: aPTT: 27 s (ref 24–36)

## 2023-09-15 LAB — PROTIME-INR
INR: 1 (ref 0.8–1.2)
Prothrombin Time: 13.6 s (ref 11.4–15.2)

## 2023-09-15 LAB — HEMOGLOBIN A1C
Hgb A1c MFr Bld: 6.1 % — ABNORMAL HIGH (ref 4.8–5.6)
Mean Plasma Glucose: 128.37 mg/dL

## 2023-09-15 LAB — GLUCOSE, CAPILLARY: Glucose-Capillary: 130 mg/dL — ABNORMAL HIGH (ref 70–99)

## 2023-09-15 NOTE — Progress Notes (Signed)
 PCP - Charlcie Cradle, FNP (pt states he switched PCPs due to the other PCP leaving on 1/1, and he has not yet met this new PCP) Cardiologist - Dr. Lorine Bears  PPM/ICD - denies   Chest x-ray - 09/15/23 EKG - 09/15/23 Stress Test - 07/01/23 ECHO - 08/04/23 Cardiac Cath - 08/12/23  Sleep Study - denies   Fasting Blood Sugar - pt states he does not know his typical fasting levels because he doesn't check his CBG at that time of the day Checks Blood Sugar once a day  Last dose of GLP1 agonist-  pt states he hasn't take Semaglutide "in about a month or two" GLP1 instructions: Pt states he has not had this medication refilled and will not be taking it prior to surgery  Blood Thinner Instructions: Hold Brilinta 5 days. Last dose 2/23 Aspirin Instructions: n/a  ERAS Protcol - no, NPO   COVID TEST- n/a   Anesthesia review: yes, cardiac hx  Patient denies shortness of breath, fever, cough and chest pain at PAT appointment   All instructions explained to the patient, with a verbal understanding of the material. Patient agrees to go over the instructions while at home for a better understanding. The opportunity to ask questions was provided.

## 2023-09-15 NOTE — Progress Notes (Signed)
 Pre-CABG testing has been completed. Preliminary results can be found in CV Proc through chart review.   09/15/23 1:35 PM Olen Cordial RVT

## 2023-09-15 NOTE — Progress Notes (Signed)
 [facility] Summary Report / Loop Recorder

## 2023-09-16 LAB — TYPE AND SCREEN
ABO/RH(D): O POS
Antibody Screen: NEGATIVE

## 2023-09-16 LAB — VAS US DOPPLER PRE CABG
Left ABI: 1.14
Right ABI: 1.09

## 2023-09-16 MED ORDER — HEPARIN 30,000 UNITS/1000 ML (OHS) CELLSAVER SOLUTION
Status: DC
  Filled 2023-09-16: qty 1000

## 2023-09-16 MED ORDER — MILRINONE LACTATE IN DEXTROSE 20-5 MG/100ML-% IV SOLN
0.3000 ug/kg/min | INTRAVENOUS | Status: DC
  Filled 2023-09-16: qty 100

## 2023-09-16 MED ORDER — TRANEXAMIC ACID (OHS) BOLUS VIA INFUSION
15.0000 mg/kg | INTRAVENOUS | Status: AC
  Administered 2023-09-19: 1503 mg via INTRAVENOUS
  Filled 2023-09-16: qty 1503

## 2023-09-16 MED ORDER — DEXMEDETOMIDINE HCL IN NACL 400 MCG/100ML IV SOLN
0.1000 ug/kg/h | INTRAVENOUS | Status: AC
  Administered 2023-09-19: .7 ug/kg/h via INTRAVENOUS
  Filled 2023-09-16: qty 100

## 2023-09-16 MED ORDER — NOREPINEPHRINE 4 MG/250ML-% IV SOLN
0.0000 ug/min | INTRAVENOUS | Status: DC
  Filled 2023-09-16: qty 250

## 2023-09-16 MED ORDER — MAGNESIUM SULFATE 50 % IJ SOLN
40.0000 meq | INTRAMUSCULAR | Status: DC
Start: 2023-09-19 — End: 2023-09-19
  Filled 2023-09-16: qty 9.85

## 2023-09-16 MED ORDER — PLASMA-LYTE A IV SOLN
INTRAVENOUS | Status: DC
  Filled 2023-09-16: qty 2.5

## 2023-09-16 MED ORDER — CEFAZOLIN SODIUM-DEXTROSE 2-4 GM/100ML-% IV SOLN
2.0000 g | INTRAVENOUS | Status: AC
  Administered 2023-09-19: 2 g via INTRAVENOUS
  Filled 2023-09-16: qty 100

## 2023-09-16 MED ORDER — EPINEPHRINE HCL 5 MG/250ML IV SOLN IN NS
0.0000 ug/min | INTRAVENOUS | Status: DC
  Filled 2023-09-16: qty 250

## 2023-09-16 MED ORDER — INSULIN REGULAR(HUMAN) IN NACL 100-0.9 UT/100ML-% IV SOLN
INTRAVENOUS | Status: AC
  Administered 2023-09-19: 1.3 [IU]/h via INTRAVENOUS
  Filled 2023-09-16: qty 100

## 2023-09-16 MED ORDER — TRANEXAMIC ACID (OHS) PUMP PRIME SOLUTION
2.0000 mg/kg | INTRAVENOUS | Status: DC
  Filled 2023-09-16: qty 2

## 2023-09-16 MED ORDER — POTASSIUM CHLORIDE 2 MEQ/ML IV SOLN
80.0000 meq | INTRAVENOUS | Status: DC
  Filled 2023-09-16: qty 40

## 2023-09-16 MED ORDER — PHENYLEPHRINE HCL-NACL 20-0.9 MG/250ML-% IV SOLN
30.0000 ug/min | INTRAVENOUS | Status: AC
  Administered 2023-09-19: 25 ug/min via INTRAVENOUS
  Filled 2023-09-16: qty 250

## 2023-09-16 MED ORDER — NITROGLYCERIN IN D5W 200-5 MCG/ML-% IV SOLN
2.0000 ug/min | INTRAVENOUS | Status: DC
  Filled 2023-09-16: qty 250

## 2023-09-16 MED ORDER — VANCOMYCIN HCL 1.5 G IV SOLR
1500.0000 mg | INTRAVENOUS | Status: AC
  Administered 2023-09-19: 1500 mg via INTRAVENOUS
  Filled 2023-09-16: qty 30

## 2023-09-16 MED ORDER — TRANEXAMIC ACID 1000 MG/10ML IV SOLN
1.5000 mg/kg/h | INTRAVENOUS | Status: DC
  Filled 2023-09-16: qty 25

## 2023-09-16 NOTE — H&P (Signed)
 [address]       [name] [address]             [phone]      Cardiothoracic Surgery Admission History and Physical   PCP is Sallee Provencal, FNP Referring Provider is Iran Ouch, MD       Chief Complaint  Patient presents with   Coronary Artery Disease           HPI:   The patient is a 63 year old gentleman with a history of type 2 diabetes, hyperlipidemia, hypertension, previous smoking until December 2024, stroke and coronary artery disease who suffered an anterior STEMI in April 2021 treated with PCI/DES to the LAD.  His LVEF at the time of his MI was 35 to 40%.  Follow-up echo in April 2022 showed normalization of his ejection fraction to 55 to 60%.  He had a nuclear stress test on 07/01/2023 that was abnormal showing a large severe fixed defect involving the mid anterior septal, mid inferoseptal, apical anterior, apical septal, and apical segments consistent with infarct in the LAD territory.  No significant ischemia was identified.  Left ventricular ejection fraction was 51% and was felt to be an intermediate risk study.  He subsequent underwent cardiac catheterization on 08/12/2023 showing significant in-stent restenosis in the LAD up to 80% with the LAD disease extending proximally and distally to the stent.  There is also a large first diagonal branch that was jailed by the stent and had 95% ostial stenosis.  The left circumflex and RCA had no significant disease.  Right heart filling pressures were normal.  Pulm artery pressure and cardiac output were normal.   He reports exertional fatigue and tiredness and has to take breaks when he is exerting himself.  He denies any shortness of breath.  He has had no chest discomfort and no pain in his neck, jaw, or arms.  He denies dizziness and syncope.  Has had no peripheral edema.  He reports a knocking sensation from his heartbeat at night and sometimes when he is exerting himself.   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     Past Medical History:  Diagnosis Date   Anemia 10/03/2009    Qualifier: Diagnosis of  By: Nena Jordan    Anxiety associated with depression 10/11/2013   Colon cancer [facility])      right colon cancer- adenocarcinoma CEA level isnrmal at 1.8   Diabetes mellitus     Diabetes mellitus type 2 in obese 03/20/2010    Qualifier: Diagnosis of  By: Nena Jordan     Erectile dysfunction 06/22/2016   Essential hypertension 06/05/2014   FATTY LIVER DISEASE 11/04/2009    Qualifier: Diagnosis of  By: Marina Goodell MD, Wilhemina Bonito   Qualifier: Diagnosis of  By: Marina Goodell MD, Wilhemina Bonito  Last Assessment & Plan:  conirmed by CT scan of abdomen in April 2016 encouraged to minimize simple carbs and fatty foods.   GERD 11/04/2009    Qualifier: Diagnosis of  By: Marina Goodell MD, Wilhemina Bonito    Great toe pain, right 04/18/2017   Hepatic artery stenosis [facility])     History of colon cancer 01/02/2010    Qualifier: Diagnosis of  By: Nena Jordan Dr Marina Goodell  Last Assessment & Plan:  Follows closely with gastroenterology, Dr Marina Goodell   Hyperlipidemia     Hyperlipidemia, mixed 09/07/2010    Qualifier: Diagnosis of  By: Nena Jordan    Hypertension     Hypertriglyceridemia 12/14/2010   Hypotestosteronism 06/22/2016   INSOMNIA, CHRONIC 03/20/2010    Qualifier: Diagnosis of  By: Nena Jordan Ambien 10 mg daily does not keep asleep AdvilPM is over sedating and has trouble waking up    Iron deficiency anemia 2011   Low testosterone 06/22/2016   Malignant neoplasm of colon (HCC) 01/02/2010    Qualifier: Diagnosis of  By: Nena Jordan Dr Marina Goodell    Muscle spasm of back 06/05/2014   Nipple pain 09/30/2016   Obesity     Palpitations 06/05/2014   Preventative health care 10/08/2012   Psoriasis     Psoriatic arthritis (HCC) 08/25/2017   Right knee pain 11/17/2011   Stroke [facility])                 Past Surgical History:  Procedure Laterality Date   BUBBLE STUDY    10/21/2020    Procedure: BUBBLE STUDY;  Surgeon: Chrystie Nose, MD;  Location: [facility] ENDOSCOPY;  Service: Cardiovascular;;   CARPAL TUNNEL RELEASE        RIGHT   CHOLECYSTECTOMY   1994   COLONOSCOPY       CORONARY/GRAFT ACUTE MI REVASCULARIZATION N/A 10/27/2019    Procedure: Coronary/Graft Acute MI Revascularization;  Surgeon: Iran Ouch, MD;  Location: [facility] INVASIVE CV LAB;  Service: Cardiovascular;  Laterality: N/A;   HEMICOLECTOMY        12/17/2009 right   LEFT HEART CATH AND CORONARY ANGIOGRAPHY N/A 10/27/2019    Procedure: LEFT HEART CATH AND CORONARY ANGIOGRAPHY;  Surgeon: Iran Ouch, MD;  Location: [facility] INVASIVE CV LAB;  Service: Cardiovascular;  Laterality: N/A;   LOOP RECORDER INSERTION N/A 10/21/2020    Procedure: LOOP RECORDER INSERTION;  Surgeon: Hillis Range, MD;  Location: [facility] INVASIVE CV LAB;  Service: Cardiovascular;  Laterality: N/A;   POLYPECTOMY       RIGHT/LEFT HEART CATH AND CORONARY ANGIOGRAPHY Bilateral 08/12/2023    Procedure: RIGHT/LEFT HEART CATH AND CORONARY ANGIOGRAPHY;  Surgeon: Iran Ouch, MD;  Location: [facility] INVASIVE CV LAB;  Service: Cardiovascular;  Laterality: Bilateral;   TEE WITHOUT CARDIOVERSION N/A 10/21/2020    Procedure: TRANSESOPHAGEAL ECHOCARDIOGRAM (TEE);  Surgeon: Chrystie Nose, MD;  Location: [facility] ENDOSCOPY;  Service: Cardiovascular;  Laterality: N/A;               Family History  Problem Relation Age of Onset   Stomach cancer Mother          diedinher 70's   Diabetes type II Brother          boderline   Diabetes Brother          type II   Alcohol abuse Brother     Other Neg Hx          cad,prostate ca, colon ca   Coronary artery disease Neg Hx     Cancer Neg Hx          colon, prostate   Colon cancer Neg Hx     Esophageal cancer Neg Hx     Rectal cancer Neg Hx            Social History Social History  Social History         Tobacco Use   Smoking status: Every Day      Current packs/day: 0.00      Types:  Cigarettes      Last attempt  to quit: 10/27/2019      Years since quitting: 3.8   Smokeless tobacco: Never   Tobacco comments:      Smokes 1 Pack per week; started back 3 months ago  Vaping Use   Vaping status: Never Used  Substance Use Topics   Alcohol use: No   Drug use: No              Current Outpatient Medications  Medication Sig Dispense Refill   diazepam (VALIUM) 2 MG tablet Take 1 tablet (2 mg total) by mouth daily as needed for anxiety. 30 tablet 0   glucose blood (CONTOUR NEXT TEST) test strip 1 each by Other route 4 (four) times daily - after meals and at bedtime. Use as instructed 100 each 12   Insulin Glargine (BASAGLAR KWIKPEN) 100 UNIT/ML Inject 15 Units into the skin at bedtime. Titrate to Fasting Blood Sugar of 100- 150 mg/dL with morning blood sugar check. (Patient taking differently: Inject 15 Units into the skin at bedtime as needed (high blood glucose). Titrate to Fasting Blood Sugar of 100- 150 mg/dL with morning blood sugar check.) 15 mL 5   Insulin lispro (HUMALOG JUNIOR KWIKPEN) 100 UNIT/ML Inject 5 Units into the skin 3 (three) times daily. 15 mL 0   insulin lispro (HUMALOG) 100 UNIT/ML KwikPen Inject 10 Units into the skin 3 (three) times daily. As directed (Patient taking differently: Inject 0-10 Units into the skin 3 (three) times daily. Sliding scale Above 170 take 4 units) 15 mL 5   lisinopril-hydrochlorothiazide (ZESTORETIC) 20-25 MG tablet Take 1 tablet by mouth daily.       metFORMIN (GLUCOPHAGE-XR) 750 MG 24 hr tablet Take 1 tablet (750 mg total) by mouth 2 (two) times daily after a meal. (Patient taking differently: Take 750 mg by mouth daily with breakfast.) 180 tablet 3   metoprolol succinate (TOPROL XL) 25 MG 24 hr tablet Take 1 tablet (25 mg total) by mouth once daily. (Patient taking differently: Take 25 mg by mouth 2 (two) times daily.) 90 tablet 3   QUEtiapine (SEROQUEL) 100 MG tablet Take 1 tablet (100 mg total) by mouth at bedtime. 30 tablet 5    rosuvastatin (CRESTOR) 20 MG tablet Take 1 tablet (20 mg total) by mouth every evening. 90 tablet 3   Semaglutide, 2 MG/DOSE, 8 MG/3ML SOPN Inject 2 mg as directed once a week. (Patient taking differently: Inject 1 mg as directed once a week.) 3 mL 11   SIMPONI 50 MG/0.5ML SOAJ Inject 0.5 mLs into the skin every 30 (thirty) days.       ticagrelor (BRILINTA) 60 MG TABS tablet Take 60 mg by mouth 2 (two) times daily.       zolpidem (AMBIEN) 10 MG tablet Take 1 tablet (10 mg total) by mouth at bedtime as needed. 30 tablet 5      No current facility-administered medications for this visit.        Allergies       Allergies  Allergen Reactions   Clopidogrel Anaphylaxis      "Weird" sensation in legs          Review of Systems  Constitutional:  Positive for activity change and fatigue.  HENT: Negative.    Eyes: Negative.   Respiratory:  Negative for chest tightness and shortness of breath.   Cardiovascular:  Positive for palpitations. Negative for chest pain and leg swelling.  Gastrointestinal: Negative.   Endocrine: Negative.   Genitourinary: Negative.   Musculoskeletal: Negative.  Allergic/Immunologic: Negative.   Neurological:  Negative for dizziness and syncope.  Hematological: Negative.   Psychiatric/Behavioral: Negative.        BP (!) 144/79 (BP Location: Left Arm)   Pulse 95   Resp 18   Ht 5\' 8"  (1.727 m)   Wt 221 lb (100.2 kg)   SpO2 96% Comment: RA  BMI 33.60 kg/m  Physical Exam Constitutional:      Appearance: Normal appearance. He is obese.  HENT:     Head: Normocephalic and atraumatic.  Eyes:     Extraocular Movements: Extraocular movements intact.     Conjunctiva/sclera: Conjunctivae normal.     Pupils: Pupils are equal, round, and reactive to light.  Neck:     Vascular: No carotid bruit.  Cardiovascular:     Rate and Rhythm: Normal rate and regular rhythm.     Pulses: Normal pulses.     Heart sounds: Normal heart sounds. No murmur heard. Pulmonary:      Effort: Pulmonary effort is normal.     Breath sounds: Normal breath sounds.  Abdominal:     General: There is no distension.     Tenderness: There is no abdominal tenderness.  Musculoskeletal:        General: No swelling.     Cervical back: Normal range of motion.  Skin:    General: Skin is warm and dry.  Neurological:     General: No focal deficit present.     Mental Status: He is alert and oriented to person, place, and time.  Psychiatric:        Mood and Affect: Mood normal.        Behavior: Behavior normal.          Diagnostic Tests:   ECHOCARDIOGRAM REPORT       Patient Name:   [name] Date of Exam: 08/04/2023  Medical Rec #:  [id number]      Height:       68.0 in  Accession #:    [id number]     Weight:       225.4 lb  Date of Birth:  [birth year]      BSA:          2.150 m  Patient Age:    62 years       BP:           111/57 mmHg  Patient Gender: M              HR:           100 bpm.  Exam Location:  [facility]   Procedure: 2D Echo, Cardiac Doppler, Color Doppler and Intracardiac             Opacification Agent   Indications:    I50.33 Acute on chronic diastolic (congestive) heart  failure    History:       Patient has prior history of Echocardiogram examinations.  CHF,                 CAD and Previous Myocardial Infarction, Pacemaker, TIA and                  Stroke, Signs/Symptoms:Chest Pain; Risk  Factors:Hypertension and                  Dyslipidemia.    Sonographer:    Ilda Mori MHA, BS, RDCS  Referring Phys: [facility] A ARIDA     Sonographer Comments: Technically difficult study due to poor  echo windows  and patient is obese. Image acquisition challenging due to patient body  habitus.  IMPRESSIONS     1. Left ventricular ejection fraction, by estimation, is 40 to 45%. The  left ventricle has mildly decreased function. The left ventricle  demonstrates regional wall motion abnormalities (severe hypokinesis of the  mid to distal  anterior and anteroseptal  wall with thinning of the myocardium). Left ventricular diastolic  parameters are consistent with Grade I diastolic dysfunction (impaired  relaxation).   2. Right ventricular systolic function is normal. The right ventricular  size is normal.   3. The mitral valve is normal in structure. No evidence of mitral valve  regurgitation. No evidence of mitral stenosis.   4. The aortic valve has an indeterminant number of cusps. Aortic valve  regurgitation is not visualized. No aortic stenosis is present.   5. The inferior vena cava is normal in size with greater than 50%  respiratory variability, suggesting right atrial pressure of 3 mmHg.   FINDINGS   Left Ventricle: Left ventricular ejection fraction, by estimation, is 40  to 45%. The left ventricle has mildly decreased function. The left  ventricle demonstrates regional wall motion abnormalities. The left  ventricular internal cavity size was normal  in size. There is no left ventricular hypertrophy. Left ventricular  diastolic parameters are consistent with Grade I diastolic dysfunction  (impaired relaxation).   Right Ventricle: The right ventricular size is normal. No increase in  right ventricular wall thickness. Right ventricular systolic function is  normal.   Left Atrium: Left atrial size was normal in size.   Right Atrium: Right atrial size was normal in size.   Pericardium: There is no evidence of pericardial effusion.   Mitral Valve: The mitral valve is normal in structure. No evidence of  mitral valve regurgitation. No evidence of mitral valve stenosis.   Tricuspid Valve: The tricuspid valve is normal in structure. Tricuspid  valve regurgitation is not demonstrated. No evidence of tricuspid  stenosis.   Aortic Valve: The aortic valve has an indeterminant number of cusps.  Aortic valve regurgitation is not visualized. No aortic stenosis is  present. Aortic valve mean gradient measures 3.0 mmHg.  Aortic valve peak  gradient measures 6.4 mmHg.   Pulmonic Valve: The pulmonic valve was normal in structure. Pulmonic valve  regurgitation is not visualized. No evidence of pulmonic stenosis.   Aorta: The aortic root is normal in size and structure.   Venous: The inferior vena cava is normal in size with greater than 50%  respiratory variability, suggesting right atrial pressure of 3 mmHg.   IAS/Shunts: No atrial level shunt detected by color flow Doppler.     LEFT VENTRICLE  PLAX 2D  LVIDd:         4.80 cm Diastology  LVIDs:         3.20 cm LV e' medial:    7.83 cm/s  LV PW:         0.90 cm LV E/e' medial:  6.4  LV IVS:        0.90 cm LV e' lateral:   7.62 cm/s                         LV E/e' lateral: 6.6     LEFT ATRIUM           Index        RIGHT ATRIUM  Index  LA diam:      4.20 cm 1.95 cm/m   RA Area:     16.90 cm  LA Vol (A4C): 62.1 ml 28.88 ml/m  RA Volume:   38.10 ml  17.72 ml/m   AORTIC VALVE  AV Vmax:           126.00 cm/s  AV Vmean:          85.300 cm/s  AV VTI:            0.200 m  AV Peak Grad:      6.4 mmHg  AV Mean Grad:      3.0 mmHg  LVOT Vmax:         104.00 cm/s  LVOT Vmean:        70.700 cm/s  LVOT VTI:          0.168 m  LVOT/AV VTI ratio: 0.84    AORTA  Ao Root diam: 3.40 cm  Ao Asc diam:  3.50 cm   MITRAL VALVE  MV Area (PHT): 5.13 cm    SHUNTS  MV Decel Time: 148 msec    Systemic VTI: 0.17 m  MV E velocity: 50.30 cm/s  MV A velocity: 66.60 cm/s  MV E/A ratio:  0.76   Julien Nordmann MD  Electronically signed by Julien Nordmann MD  Signature Date/Time: 08/04/2023/5:51:32 PM        Final        Physicians   Panel Physicians Referring Physician Case Authorizing Physician  Iran Ouch, MD (Primary)        Procedures   RIGHT/LEFT HEART CATH AND CORONARY ANGIOGRAPHY    Conclusion       Mid RCA lesion is 40% stenosed.   Prox LAD lesion is 60% stenosed.   Prox LAD to Mid LAD lesion is 80% stenosed.   1st Diag-2  lesion is 40% stenosed.   Mid LAD lesion is 50% stenosed.   1st Diag-1 lesion is 95% stenosed.   1.  Significant in-stent restenosis in the LAD.  In addition, the LAD disease extends outside the stent proximally and distally.  In addition, the large first diagonal which was jailed by the stent has 95% ostial stenosis. 2.  Left ventricular angiography was not performed.  EF was mildly reduced by echo. 3.  Right heart catheterization showed normal filling pressures, normal pulmonary pressure and normal cardiac output.   Recommendations: Due to diffuse in-stent restenosis, extension of the disease outside of the stented segment as well as the involvement of a large diagonal branch, I recommend evaluation for two-vessel CABG especially that he is diabetic. Brilinta can be stopped once surgery is scheduled.   Indications   Coronary artery disease involving native coronary artery of native heart with other form of angina pectoris (HCC) [I25.118 (ICD-10-CM)]  Chronic systolic congestive heart failure (HCC) [Z61.09 (ICD-10-CM)]    Procedural Details   Technical Details Procedural Details: The right antecubital area was prepped in a sterile fashion.  It was anesthetized with 1% lidocaine.  Ultrasound was used to identify the right antecubital vein which was patent.  Under direct ultrasound visualization, a 4 French slender sheath was placed.  Ultrasound guidance was also used for right radial artery access and placement of a 5 French slender sheath. 2.5 mg of verapamil was administered through the sheath, weight-based unfractionated heparin was administered intravenously. Right heart catheterization was performed using a 5 French Swan-Ganz catheter. Cardiac output was calculated by the Fick method. A Annice Pih  catheter was used for selective coronary angiography and LV pressure. There were no immediate procedural complications. A TR band was used for radial hemostasis at the completion of the procedure.  The  patient was transferred to the post catheterization recovery area for further monitoring.    Estimated blood loss <50 mL.   During this procedure medications were administered to achieve and maintain moderate conscious sedation while the patient's heart rate, blood pressure, and oxygen saturation were continuously monitored and I was present face-to-face 100% of this time. Christianne Borrow Cardiovascular Specialist and Isac Sarna Lab Personnel are independent, trained observers who assisted in the monitoring of the patient's level of consciousness.    Medications (Filter: Administrations occurring from 0903 to 1013 on 08/12/23)  important  Continuous medications are totaled by the amount administered until 08/12/23 1013.    fentaNYL (SUBLIMAZE) injection (mcg)  Total dose: 50 mcg Date/Time Rate/Dose/Volume Action    08/12/23 0924 50 mcg Given    midazolam (VERSED) injection (mg)  Total dose: 1 mg Date/Time Rate/Dose/Volume Action    08/12/23 0924 1 mg Given    Heparin (Porcine) in NaCl 2000-0.9 UNIT/L-% SOLN (mL)  Total volume: 1,000 mL Date/Time Rate/Dose/Volume Action    08/12/23 0924 1,000 mL Given    lidocaine (PF) (XYLOCAINE) 1 % injection (mL)  Total volume: 2 mL Date/Time Rate/Dose/Volume Action    08/12/23 0925 2 mL Given    verapamil (ISOPTIN) injection (mg)  Total dose: 2.5 mg Date/Time Rate/Dose/Volume Action    08/12/23 0932 2.5 mg Given    heparin sodium (porcine) injection (Units)  Total dose: 5,000 Units Date/Time Rate/Dose/Volume Action    08/12/23 0941 5,000 Units Given    iohexol (OMNIPAQUE) 300 MG/ML solution (mL)  Total volume: 40 mL Date/Time Rate/Dose/Volume Action    08/12/23 0954 40 mL Given      Sedation Time   Sedation Time Physician-1: 32 minutes 5 seconds Contrast        Administrations occurring from 0903 to 1013 on 08/12/23:  Medication Name Total Dose  iohexol (OMNIPAQUE) 300 MG/ML solution 40 mL    Radiation/Fluoro   Fluoro time:  3.4 (min) DAP: 28.2 (Gycm2) Cumulative Air Kerma: 421 (mGy) Complications   Complications documented before study signed (08/12/2023 11:58 AM)    No complications were associated with this study.  Documented by Isac Sarna - 08/12/2023  9:49 AM      Coronary Findings   Diagnostic Dominance: Co-dominant Left Main  Vessel is angiographically normal.    Left Anterior Descending  Prox LAD lesion is 60% stenosed.  Prox LAD to Mid LAD lesion is 80% stenosed. The lesion is located at the major branch. The lesion is mildly calcified. The lesion was previously treated using a drug eluting stent over 2 years ago.  Mid LAD lesion is 50% stenosed.    First Diagonal Branch  Vessel is large in size.  1st Diag-1 lesion is 95% stenosed. The lesion is thrombotic.  1st Diag-2 lesion is 40% stenosed. The lesion is thrombotic.    Left Circumflex  The vessel exhibits minimal luminal irregularities.    Left Posterior Atrioventricular Artery  The vessel exhibits minimal luminal irregularities.    Right Coronary Artery  Mid RCA lesion is 40% stenosed.    Intervention    No interventions have been documented.    Coronary Diagrams   Diagnostic Dominance: Co-dominant  Intervention    Implants    No implant documentation for this case.    Syngo Images  Show images for CARDIAC CATHETERIZATION Images on Long Term Storage    Show images for Avrey, Hyser "Richie" Link to Procedure Log   Procedure Log    Hemo Data   Flowsheet Row Most Recent Value  Fick Cardiac Output 6.13 L/min  Fick Cardiac Output Index 2.94 (L/min)/BSA  RA A Wave 11 mmHg  RA V Wave 10 mmHg  RA Mean 9 mmHg  RV Systolic Pressure 27 mmHg  RV Diastolic Pressure 4 mmHg  RV EDP 9 mmHg  PA Systolic Pressure 24 mmHg  PA Diastolic Pressure 10 mmHg  PA Mean 17 mmHg  PW A Wave 16 mmHg  PW V Wave 15 mmHg  PW Mean 13 mmHg  AO Systolic Pressure 0 mmHg  AO Diastolic Pressure 0 mmHg  AO Mean 0 mmHg  LV  Systolic Pressure 121 mmHg  LV Diastolic Pressure -4 mmHg  LV EDP 11 mmHg  Arterial Occlusion Pressure Extended Systolic Pressure 127 mmHg  Arterial Occlusion Pressure Extended Diastolic Pressure 70 mmHg  Arterial Occlusion Pressure Extended Mean Pressure 97 mmHg  Left Ventricular Apex Extended Systolic Pressure 126 mmHg  LVp Diastolic Pressure -3 mmHg  Left Ventricular Apex Extended EDP Pressure 20 mmHg  QP/QS 1  TPVR Index 5.79 HRUI    Impression:   This 63 year old gentleman has severe LAD/diagonal disease due to in-stent restenosis in a heavily calcified vessel.  His distal LAD and diagonal branch appear graftable.  His LV systolic function did normalize 1 year after his infarct in 2021 and my hope would be that his current decreased function is reversible.  He has developed progressive exertional fatigue and tiredness which is likely ischemia mediated.  I think the best treatment is to proceed with coronary bypass graft surgery to the LAD and diagonal branch. I discussed the operative procedure with the patient and his ex-fiance including alternatives, benefits and risks; including but not limited to bleeding, blood transfusion, infection, stroke, myocardial infarction, graft failure, heart block requiring a permanent pacemaker, organ dysfunction, and death.  Carin Hock understands and agrees to proceed.      Plan:   Coronary bypass graft surgery.     Alleen Borne, MD [facility] [phone]

## 2023-09-18 NOTE — Anesthesia Preprocedure Evaluation (Signed)
 Anesthesia Evaluation  Patient identified by MRN, date of birth, ID band Patient awake    Reviewed: Allergy & Precautions, NPO status , Patient's Chart, lab work & pertinent test results, reviewed documented beta blocker date and time   Airway Mallampati: III  TM Distance: >3 FB Neck ROM: Full    Dental  (+) Dental Advisory Given, Missing   Pulmonary Patient abstained from smoking., former smoker   Pulmonary exam normal breath sounds clear to auscultation       Cardiovascular hypertension, Pt. on medications and Pt. on home beta blockers + CAD and +CHF  Normal cardiovascular exam Rhythm:Regular Rate:Normal  Echo 08/04/23:  1. Left ventricular ejection fraction, by estimation, is 40 to 45%. The  left ventricle has mildly decreased function. The left ventricle  demonstrates regional wall motion abnormalities (severe hypokinesis of the  mid to distal anterior and anteroseptal  wall with thinning of the myocardium). Left ventricular diastolic  parameters are consistent with Grade I diastolic dysfunction (impaired  relaxation).   2. Right ventricular systolic function is normal. The right ventricular  size is normal.   3. The mitral valve is normal in structure. No evidence of mitral valve  regurgitation. No evidence of mitral stenosis.   4. The aortic valve has an indeterminant number of cusps. Aortic valve  regurgitation is not visualized. No aortic stenosis is present.   5. The inferior vena cava is normal in size with greater than 50%  respiratory variability, suggesting right atrial pressure of 3 mmHg.     Neuro/Psych  PSYCHIATRIC DISORDERS Anxiety Depression    CVA    GI/Hepatic Neg liver ROS,GERD  ,,H/o colon cancer    Endo/Other  diabetes, Type 2, Insulin Dependent  Obesity   Renal/GU negative Renal ROS     Musculoskeletal  (+) Arthritis ,    Abdominal   Peds  Hematology negative hematology ROS (+)    Anesthesia Other Findings   Reproductive/Obstetrics                              Anesthesia Physical Anesthesia Plan  ASA: 4  Anesthesia Plan: General   Post-op Pain Management:    Induction: Intravenous  PONV Risk Score and Plan: 2 and Midazolam and Treatment may vary due to age or medical condition  Airway Management Planned: Oral ETT  Additional Equipment: Arterial line, CVP, PA Cath, TEE and Ultrasound Guidance Line Placement  Intra-op Plan:   Post-operative Plan: Post-operative intubation/ventilation  Informed Consent: I have reviewed the patients History and Physical, chart, labs and discussed the procedure including the risks, benefits and alternatives for the proposed anesthesia with the patient or authorized representative who has indicated his/her understanding and acceptance.     Dental advisory given  Plan Discussed with: CRNA  Anesthesia Plan Comments:          Anesthesia Quick Evaluation

## 2023-09-19 ENCOUNTER — Other Ambulatory Visit: Payer: Self-pay

## 2023-09-19 ENCOUNTER — Inpatient Hospital Stay (HOSPITAL_COMMUNITY)
Admission: RE | Admit: 2023-09-19 | Discharge: 2023-09-23 | DRG: 236 | Disposition: A | Discharge status: Home / Self Care | Payer: Managed Care, Other (non HMO) | Attending: Surgery | Admitting: Surgery

## 2023-09-19 ENCOUNTER — Inpatient Hospital Stay (HOSPITAL_COMMUNITY)

## 2023-09-19 ENCOUNTER — Encounter (HOSPITAL_COMMUNITY): Payer: Self-pay | Admitting: Surgery

## 2023-09-19 ENCOUNTER — Inpatient Hospital Stay (HOSPITAL_COMMUNITY): Payer: Self-pay | Admitting: Certified Registered Nurse Anesthetist

## 2023-09-19 ENCOUNTER — Inpatient Hospital Stay (HOSPITAL_COMMUNITY)
Admission: RE | Disposition: A | Discharge status: Home / Self Care | Payer: Self-pay | Source: Home / Self Care | Attending: Surgery

## 2023-09-19 DIAGNOSIS — E782 Mixed hyperlipidemia: Secondary | ICD-10-CM | POA: Diagnosis present

## 2023-09-19 DIAGNOSIS — F419 Anxiety disorder, unspecified: Secondary | ICD-10-CM | POA: Diagnosis present

## 2023-09-19 DIAGNOSIS — Z6834 Body mass index (BMI) 34.0-34.9, adult: Secondary | ICD-10-CM | POA: Diagnosis not present

## 2023-09-19 DIAGNOSIS — Y831 Surgical operation with implant of artificial internal device as the cause of abnormal reaction of the patient, or of later complication, without mention of misadventure at the time of the procedure: Secondary | ICD-10-CM | POA: Diagnosis present

## 2023-09-19 DIAGNOSIS — Z7902 Long term (current) use of antithrombotics/antiplatelets: Secondary | ICD-10-CM

## 2023-09-19 DIAGNOSIS — Z833 Family history of diabetes mellitus: Secondary | ICD-10-CM

## 2023-09-19 DIAGNOSIS — Z85038 Personal history of other malignant neoplasm of large intestine: Secondary | ICD-10-CM

## 2023-09-19 DIAGNOSIS — K760 Fatty (change of) liver, not elsewhere classified: Secondary | ICD-10-CM | POA: Diagnosis present

## 2023-09-19 DIAGNOSIS — Z87891 Personal history of nicotine dependence: Secondary | ICD-10-CM | POA: Diagnosis not present

## 2023-09-19 DIAGNOSIS — I5022 Chronic systolic (congestive) heart failure: Secondary | ICD-10-CM | POA: Diagnosis present

## 2023-09-19 DIAGNOSIS — I110 Hypertensive heart disease with heart failure: Secondary | ICD-10-CM | POA: Diagnosis present

## 2023-09-19 DIAGNOSIS — Z7985 Long-term (current) use of injectable non-insulin antidiabetic drugs: Secondary | ICD-10-CM

## 2023-09-19 DIAGNOSIS — D62 Acute posthemorrhagic anemia: Secondary | ICD-10-CM | POA: Diagnosis not present

## 2023-09-19 DIAGNOSIS — I2510 Atherosclerotic heart disease of native coronary artery without angina pectoris: Secondary | ICD-10-CM | POA: Diagnosis present

## 2023-09-19 DIAGNOSIS — E1169 Type 2 diabetes mellitus with other specified complication: Secondary | ICD-10-CM | POA: Diagnosis present

## 2023-09-19 DIAGNOSIS — I509 Heart failure, unspecified: Secondary | ICD-10-CM | POA: Diagnosis not present

## 2023-09-19 DIAGNOSIS — L4050 Arthropathic psoriasis, unspecified: Secondary | ICD-10-CM | POA: Diagnosis present

## 2023-09-19 DIAGNOSIS — Z800 Family history of malignant neoplasm of digestive organs: Secondary | ICD-10-CM

## 2023-09-19 DIAGNOSIS — Z8042 Family history of malignant neoplasm of prostate: Secondary | ICD-10-CM

## 2023-09-19 DIAGNOSIS — Z888 Allergy status to other drugs, medicaments and biological substances status: Secondary | ICD-10-CM | POA: Diagnosis not present

## 2023-09-19 DIAGNOSIS — Z811 Family history of alcohol abuse and dependence: Secondary | ICD-10-CM

## 2023-09-19 DIAGNOSIS — T82855A Stenosis of coronary artery stent, initial encounter: Principal | ICD-10-CM | POA: Diagnosis present

## 2023-09-19 DIAGNOSIS — Z8673 Personal history of transient ischemic attack (TIA), and cerebral infarction without residual deficits: Secondary | ICD-10-CM | POA: Diagnosis not present

## 2023-09-19 DIAGNOSIS — Z7982 Long term (current) use of aspirin: Secondary | ICD-10-CM

## 2023-09-19 DIAGNOSIS — Z7984 Long term (current) use of oral hypoglycemic drugs: Secondary | ICD-10-CM

## 2023-09-19 DIAGNOSIS — E669 Obesity, unspecified: Secondary | ICD-10-CM | POA: Diagnosis present

## 2023-09-19 DIAGNOSIS — D696 Thrombocytopenia, unspecified: Secondary | ICD-10-CM | POA: Diagnosis not present

## 2023-09-19 DIAGNOSIS — Z8249 Family history of ischemic heart disease and other diseases of the circulatory system: Secondary | ICD-10-CM | POA: Diagnosis not present

## 2023-09-19 DIAGNOSIS — Z794 Long term (current) use of insulin: Secondary | ICD-10-CM | POA: Diagnosis not present

## 2023-09-19 DIAGNOSIS — I252 Old myocardial infarction: Secondary | ICD-10-CM | POA: Diagnosis not present

## 2023-09-19 DIAGNOSIS — K219 Gastro-esophageal reflux disease without esophagitis: Secondary | ICD-10-CM | POA: Diagnosis present

## 2023-09-19 DIAGNOSIS — Z79899 Other long term (current) drug therapy: Secondary | ICD-10-CM | POA: Diagnosis not present

## 2023-09-19 DIAGNOSIS — I25118 Atherosclerotic heart disease of native coronary artery with other forms of angina pectoris: Secondary | ICD-10-CM

## 2023-09-19 DIAGNOSIS — Z955 Presence of coronary angioplasty implant and graft: Secondary | ICD-10-CM

## 2023-09-19 DIAGNOSIS — Z951 Presence of aortocoronary bypass graft: Secondary | ICD-10-CM

## 2023-09-19 HISTORY — PX: CORONARY ARTERY BYPASS GRAFT: SHX141

## 2023-09-19 HISTORY — PX: TEE WITHOUT CARDIOVERSION: SHX5443

## 2023-09-19 LAB — POCT I-STAT 7, (LYTES, BLD GAS, ICA,H+H)
Acid-base deficit: 4 mmol/L — ABNORMAL HIGH (ref 0.0–2.0)
Acid-base deficit: 2 mmol/L (ref 0.0–2.0)
Acid-base deficit: 2 mmol/L (ref 0.0–2.0)
Acid-base deficit: 3 mmol/L — ABNORMAL HIGH (ref 0.0–2.0)
Acid-base deficit: 4 mmol/L — ABNORMAL HIGH (ref 0.0–2.0)
Acid-base deficit: 4 mmol/L — ABNORMAL HIGH (ref 0.0–2.0)
Acid-base deficit: 5 mmol/L — ABNORMAL HIGH (ref 0.0–2.0)
Acid-base deficit: 2 mmol/L (ref 0.0–2.0)
Bicarbonate: 21.3 mmol/L (ref 20.0–28.0)
Bicarbonate: 22.6 mmol/L (ref 20.0–28.0)
Bicarbonate: 21.6 mmol/L (ref 20.0–28.0)
Bicarbonate: 23.2 mmol/L (ref 20.0–28.0)
Bicarbonate: 21.7 mmol/L (ref 20.0–28.0)
Bicarbonate: 21.5 mmol/L (ref 20.0–28.0)
Bicarbonate: 22.1 mmol/L (ref 20.0–28.0)
Bicarbonate: 24.1 mmol/L (ref 20.0–28.0)
Calcium, Ion: 1.15 mmol/L (ref 1.15–1.40)
Calcium, Ion: 1 mmol/L — ABNORMAL LOW (ref 1.15–1.40)
Calcium, Ion: 1.08 mmol/L — ABNORMAL LOW (ref 1.15–1.40)
Calcium, Ion: 1.09 mmol/L — ABNORMAL LOW (ref 1.15–1.40)
Calcium, Ion: 1.14 mmol/L — ABNORMAL LOW (ref 1.15–1.40)
Calcium, Ion: 1.13 mmol/L — ABNORMAL LOW (ref 1.15–1.40)
Calcium, Ion: 1.14 mmol/L — ABNORMAL LOW (ref 1.15–1.40)
Calcium, Ion: 1.16 mmol/L (ref 1.15–1.40)
HCT: 36 % — ABNORMAL LOW (ref 39.0–52.0)
HCT: 27 % — ABNORMAL LOW (ref 39.0–52.0)
HCT: 26 % — ABNORMAL LOW (ref 39.0–52.0)
HCT: 27 % — ABNORMAL LOW (ref 39.0–52.0)
HCT: 30 % — ABNORMAL LOW (ref 39.0–52.0)
HCT: 31 % — ABNORMAL LOW (ref 39.0–52.0)
HCT: 34 % — ABNORMAL LOW (ref 39.0–52.0)
HCT: 31 % — ABNORMAL LOW (ref 39.0–52.0)
Hemoglobin: 12.2 g/dL — ABNORMAL LOW (ref 13.0–17.0)
Hemoglobin: 9.2 g/dL — ABNORMAL LOW (ref 13.0–17.0)
Hemoglobin: 8.8 g/dL — ABNORMAL LOW (ref 13.0–17.0)
Hemoglobin: 9.2 g/dL — ABNORMAL LOW (ref 13.0–17.0)
Hemoglobin: 10.2 g/dL — ABNORMAL LOW (ref 13.0–17.0)
Hemoglobin: 10.5 g/dL — ABNORMAL LOW (ref 13.0–17.0)
Hemoglobin: 11.6 g/dL — ABNORMAL LOW (ref 13.0–17.0)
Hemoglobin: 10.5 g/dL — ABNORMAL LOW (ref 13.0–17.0)
O2 Saturation: 100 %
O2 Saturation: 100 %
O2 Saturation: 100 %
O2 Saturation: 100 %
O2 Saturation: 94 %
O2 Saturation: 97 %
O2 Saturation: 97 %
O2 Saturation: 98 %
Patient temperature: 36.1
Patient temperature: 37.4
Patient temperature: 37.5
Patient temperature: 37.9
Potassium: 3.5 mmol/L (ref 3.5–5.1)
Potassium: 4.4 mmol/L (ref 3.5–5.1)
Potassium: 4.3 mmol/L (ref 3.5–5.1)
Potassium: 4.8 mmol/L (ref 3.5–5.1)
Potassium: 3.8 mmol/L (ref 3.5–5.1)
Potassium: 4.4 mmol/L (ref 3.5–5.1)
Potassium: 4.5 mmol/L (ref 3.5–5.1)
Potassium: 4.6 mmol/L (ref 3.5–5.1)
Sodium: 143 mmol/L (ref 135–145)
Sodium: 139 mmol/L (ref 135–145)
Sodium: 139 mmol/L (ref 135–145)
Sodium: 140 mmol/L (ref 135–145)
Sodium: 142 mmol/L (ref 135–145)
Sodium: 140 mmol/L (ref 135–145)
Sodium: 141 mmol/L (ref 135–145)
Sodium: 140 mmol/L (ref 135–145)
TCO2: 22 mmol/L (ref 22–32)
TCO2: 24 mmol/L (ref 22–32)
TCO2: 23 mmol/L (ref 22–32)
TCO2: 24 mmol/L (ref 22–32)
TCO2: 23 mmol/L (ref 22–32)
TCO2: 23 mmol/L (ref 22–32)
TCO2: 23 mmol/L (ref 22–32)
TCO2: 25 mmol/L (ref 22–32)
pCO2 arterial: 40.6 mmHg (ref 32–48)
pCO2 arterial: 37.4 mmHg (ref 32–48)
pCO2 arterial: 33.9 mmHg (ref 32–48)
pCO2 arterial: 39.6 mmHg (ref 32–48)
pCO2 arterial: 39.3 mmHg (ref 32–48)
pCO2 arterial: 44.2 mmHg (ref 32–48)
pCO2 arterial: 45 mmHg (ref 32–48)
pCO2 arterial: 45.9 mmHg (ref 32–48)
pH, Arterial: 7.328 — ABNORMAL LOW (ref 7.35–7.45)
pH, Arterial: 7.389 (ref 7.35–7.45)
pH, Arterial: 7.375 (ref 7.35–7.45)
pH, Arterial: 7.412 (ref 7.35–7.45)
pH, Arterial: 7.345 — ABNORMAL LOW (ref 7.35–7.45)
pH, Arterial: 7.298 — ABNORMAL LOW (ref 7.35–7.45)
pH, Arterial: 7.301 — ABNORMAL LOW (ref 7.35–7.45)
pH, Arterial: 7.332 — ABNORMAL LOW (ref 7.35–7.45)
pO2, Arterial: 443 mmHg — ABNORMAL HIGH (ref 83–108)
pO2, Arterial: 394 mmHg — ABNORMAL HIGH (ref 83–108)
pO2, Arterial: 258 mmHg — ABNORMAL HIGH (ref 83–108)
pO2, Arterial: 397 mmHg — ABNORMAL HIGH (ref 83–108)
pO2, Arterial: 71 mmHg — ABNORMAL LOW (ref 83–108)
pO2, Arterial: 107 mmHg (ref 83–108)
pO2, Arterial: 99 mmHg (ref 83–108)
pO2, Arterial: 128 mmHg — ABNORMAL HIGH (ref 83–108)

## 2023-09-19 LAB — POCT I-STAT, CHEM 8
BUN: 7 mg/dL (ref 6–20)
BUN: 6 mg/dL (ref 6–20)
BUN: 7 mg/dL (ref 6–20)
BUN: 6 mg/dL (ref 6–20)
Calcium, Ion: 1.19 mmol/L (ref 1.15–1.40)
Calcium, Ion: 1.14 mmol/L — ABNORMAL LOW (ref 1.15–1.40)
Calcium, Ion: 1.05 mmol/L — ABNORMAL LOW (ref 1.15–1.40)
Calcium, Ion: 1.09 mmol/L — ABNORMAL LOW (ref 1.15–1.40)
Chloride: 107 mmol/L (ref 98–111)
Chloride: 108 mmol/L (ref 98–111)
Chloride: 104 mmol/L (ref 98–111)
Chloride: 106 mmol/L (ref 98–111)
Creatinine, Ser: 0.7 mg/dL (ref 0.61–1.24)
Creatinine, Ser: 0.8 mg/dL (ref 0.61–1.24)
Creatinine, Ser: 0.7 mg/dL (ref 0.61–1.24)
Creatinine, Ser: 0.7 mg/dL (ref 0.61–1.24)
Glucose, Bld: 97 mg/dL (ref 70–99)
Glucose, Bld: 94 mg/dL (ref 70–99)
Glucose, Bld: 93 mg/dL (ref 70–99)
Glucose, Bld: 124 mg/dL — ABNORMAL HIGH (ref 70–99)
HCT: 38 % — ABNORMAL LOW (ref 39.0–52.0)
HCT: 33 % — ABNORMAL LOW (ref 39.0–52.0)
HCT: 26 % — ABNORMAL LOW (ref 39.0–52.0)
HCT: 25 % — ABNORMAL LOW (ref 39.0–52.0)
Hemoglobin: 12.9 g/dL — ABNORMAL LOW (ref 13.0–17.0)
Hemoglobin: 11.2 g/dL — ABNORMAL LOW (ref 13.0–17.0)
Hemoglobin: 8.8 g/dL — ABNORMAL LOW (ref 13.0–17.0)
Hemoglobin: 8.5 g/dL — ABNORMAL LOW (ref 13.0–17.0)
Potassium: 3.7 mmol/L (ref 3.5–5.1)
Potassium: 4 mmol/L (ref 3.5–5.1)
Potassium: 5 mmol/L (ref 3.5–5.1)
Potassium: 4.2 mmol/L (ref 3.5–5.1)
Sodium: 142 mmol/L (ref 135–145)
Sodium: 139 mmol/L (ref 135–145)
Sodium: 138 mmol/L (ref 135–145)
Sodium: 138 mmol/L (ref 135–145)
TCO2: 25 mmol/L (ref 22–32)
TCO2: 21 mmol/L — ABNORMAL LOW (ref 22–32)
TCO2: 24 mmol/L (ref 22–32)
TCO2: 26 mmol/L (ref 22–32)

## 2023-09-19 LAB — CBC
HCT: 30.8 % — ABNORMAL LOW (ref 39.0–52.0)
HCT: 33.5 % — ABNORMAL LOW (ref 39.0–52.0)
Hemoglobin: 10.6 g/dL — ABNORMAL LOW (ref 13.0–17.0)
Hemoglobin: 11.2 g/dL — ABNORMAL LOW (ref 13.0–17.0)
MCH: 32.5 pg (ref 26.0–34.0)
MCH: 31.8 pg (ref 26.0–34.0)
MCHC: 34.4 g/dL (ref 30.0–36.0)
MCHC: 33.4 g/dL (ref 30.0–36.0)
MCV: 94.5 fL (ref 80.0–100.0)
MCV: 95.2 fL (ref 80.0–100.0)
Platelets: 147 10*3/uL — ABNORMAL LOW (ref 150–400)
Platelets: 163 10*3/uL (ref 150–400)
RBC: 3.26 MIL/uL — ABNORMAL LOW (ref 4.22–5.81)
RBC: 3.52 MIL/uL — ABNORMAL LOW (ref 4.22–5.81)
RDW: 11.9 % (ref 11.5–15.5)
RDW: 12 % (ref 11.5–15.5)
WBC: 12.9 10*3/uL — ABNORMAL HIGH (ref 4.0–10.5)
WBC: 13 10*3/uL — ABNORMAL HIGH (ref 4.0–10.5)
nRBC: 0 % (ref 0.0–0.2)
nRBC: 0 % (ref 0.0–0.2)

## 2023-09-19 LAB — GLUCOSE, CAPILLARY
Glucose-Capillary: 94 mg/dL (ref 70–99)
Glucose-Capillary: 89 mg/dL (ref 70–99)
Glucose-Capillary: 122 mg/dL — ABNORMAL HIGH (ref 70–99)
Glucose-Capillary: 107 mg/dL — ABNORMAL HIGH (ref 70–99)
Glucose-Capillary: 116 mg/dL — ABNORMAL HIGH (ref 70–99)
Glucose-Capillary: 104 mg/dL — ABNORMAL HIGH (ref 70–99)
Glucose-Capillary: 132 mg/dL — ABNORMAL HIGH (ref 70–99)
Glucose-Capillary: 121 mg/dL — ABNORMAL HIGH (ref 70–99)
Glucose-Capillary: 120 mg/dL — ABNORMAL HIGH (ref 70–99)
Glucose-Capillary: 124 mg/dL — ABNORMAL HIGH (ref 70–99)
Glucose-Capillary: 117 mg/dL — ABNORMAL HIGH (ref 70–99)

## 2023-09-19 LAB — POCT I-STAT EG7
Acid-base deficit: 2 mmol/L (ref 0.0–2.0)
Bicarbonate: 22.6 mmol/L (ref 20.0–28.0)
Calcium, Ion: 1.06 mmol/L — ABNORMAL LOW (ref 1.15–1.40)
HCT: 26 % — ABNORMAL LOW (ref 39.0–52.0)
Hemoglobin: 8.8 g/dL — ABNORMAL LOW (ref 13.0–17.0)
O2 Saturation: 80 %
Potassium: 5.2 mmol/L — ABNORMAL HIGH (ref 3.5–5.1)
Sodium: 140 mmol/L (ref 135–145)
TCO2: 24 mmol/L (ref 22–32)
pCO2, Ven: 38.8 mmHg — ABNORMAL LOW (ref 44–60)
pH, Ven: 7.374 (ref 7.25–7.43)
pO2, Ven: 45 mmHg (ref 32–45)

## 2023-09-19 LAB — BASIC METABOLIC PANEL WITH GFR
Anion gap: 4 — ABNORMAL LOW (ref 5–15)
BUN: 6 mg/dL (ref 6–20)
CO2: 23 mmol/L (ref 22–32)
Calcium: 7.7 mg/dL — ABNORMAL LOW (ref 8.9–10.3)
Chloride: 110 mmol/L (ref 98–111)
Creatinine, Ser: 0.83 mg/dL (ref 0.61–1.24)
GFR, Estimated: >60 mL/min
Glucose, Bld: 121 mg/dL — ABNORMAL HIGH (ref 70–99)
Potassium: 4.1 mmol/L (ref 3.5–5.1)
Sodium: 137 mmol/L (ref 135–145)

## 2023-09-19 LAB — PROTIME-INR
INR: 1.5 — ABNORMAL HIGH (ref 0.8–1.2)
Prothrombin Time: 18.1 s — ABNORMAL HIGH (ref 11.4–15.2)

## 2023-09-19 LAB — HEMOGLOBIN AND HEMATOCRIT, BLOOD
HCT: 27.1 % — ABNORMAL LOW (ref 39.0–52.0)
Hemoglobin: 9.4 g/dL — ABNORMAL LOW (ref 13.0–17.0)

## 2023-09-19 LAB — PLATELET COUNT: Platelets: 164 10*3/uL (ref 150–400)

## 2023-09-19 LAB — MAGNESIUM: Magnesium: 2.7 mg/dL — ABNORMAL HIGH (ref 1.7–2.4)

## 2023-09-19 LAB — APTT: aPTT: 31 s (ref 24–36)

## 2023-09-19 SURGERY — CORONARY ARTERY BYPASS GRAFTING (CABG)
Anesthesia: General | Site: Chest

## 2023-09-19 MED ORDER — PLASMA-LYTE A IV SOLN
INTRAVENOUS | Status: DC | PRN
  Administered 2023-09-19: 500 mL via INTRAVASCULAR

## 2023-09-19 MED ORDER — SODIUM BICARBONATE 8.4 % IV SOLN
50.0000 meq | Freq: Once | INTRAVENOUS | Status: AC
  Administered 2023-09-19: 50 meq via INTRAVENOUS

## 2023-09-19 MED ORDER — CEFAZOLIN SODIUM-DEXTROSE 2-4 GM/100ML-% IV SOLN
2.0000 g | Freq: Three times a day (TID) | INTRAVENOUS | Status: AC
  Administered 2023-09-19 – 2023-09-21 (×6): 2 g via INTRAVENOUS
  Filled 2023-09-19 (×6): qty 100

## 2023-09-19 MED ORDER — SODIUM CHLORIDE 0.9% FLUSH
3.0000 mL | Freq: Two times a day (BID) | INTRAVENOUS | Status: DC
  Administered 2023-09-19 (×2): 10 mL via INTRAVENOUS

## 2023-09-19 MED ORDER — BISACODYL 10 MG RE SUPP: 10.0000 mg | Freq: Every day | RECTAL | Status: DC

## 2023-09-19 MED ORDER — HEPARIN SODIUM (PORCINE) 1000 UNIT/ML IJ SOLN
INTRAMUSCULAR | Status: AC
  Filled 2023-09-19: qty 10

## 2023-09-19 MED ORDER — SODIUM CHLORIDE 0.9% FLUSH
3.0000 mL | Freq: Two times a day (BID) | INTRAVENOUS | Status: DC
  Administered 2023-09-19 – 2023-09-20 (×3): 10 mL via INTRAVENOUS

## 2023-09-19 MED ORDER — SODIUM CHLORIDE 0.9% FLUSH: 3.0000 mL | INTRAVENOUS | Status: DC | PRN

## 2023-09-19 MED ORDER — DEXTROSE 50 % IV SOLN: 0.0000 mL | INTRAVENOUS | Status: DC | PRN

## 2023-09-19 MED ORDER — CHLORHEXIDINE GLUCONATE CLOTH 2 % EX PADS
6.0000 | MEDICATED_PAD | Freq: Every day | CUTANEOUS | Status: DC
  Administered 2023-09-20: 6 via TOPICAL

## 2023-09-19 MED ORDER — DIAZEPAM 2 MG PO TABS: 2.0000 mg | ORAL_TABLET | Freq: Every day | ORAL | Status: DC | PRN

## 2023-09-19 MED ORDER — LACTATED RINGERS IV SOLN: INTRAVENOUS | Status: DC | PRN

## 2023-09-19 MED ORDER — SODIUM CHLORIDE 0.9% FLUSH
3.0000 mL | Freq: Two times a day (BID) | INTRAVENOUS | Status: DC
  Administered 2023-09-19: 4 mL via INTRAVENOUS

## 2023-09-19 MED ORDER — THROMBIN 20000 UNITS EX SOLR
OROMUCOSAL | Status: DC | PRN
  Administered 2023-09-19 (×3): 4 mL via TOPICAL

## 2023-09-19 MED ORDER — PANTOPRAZOLE SODIUM 40 MG IV SOLR
40.0000 mg | Freq: Every day | INTRAVENOUS | Status: AC
  Administered 2023-09-19 – 2023-09-20 (×2): 40 mg via INTRAVENOUS
  Filled 2023-09-19 (×2): qty 10

## 2023-09-19 MED ORDER — PROPOFOL 10 MG/ML IV BOLUS
INTRAVENOUS | Status: DC | PRN
  Administered 2023-09-19: 70 mg via INTRAVENOUS
  Administered 2023-09-19: 50 mg via INTRAVENOUS
  Administered 2023-09-19: 20 mg via INTRAVENOUS

## 2023-09-19 MED ORDER — HEPARIN SODIUM (PORCINE) 1000 UNIT/ML IJ SOLN
INTRAMUSCULAR | Status: AC
  Filled 2023-09-19: qty 1

## 2023-09-19 MED ORDER — SODIUM CHLORIDE 0.9 % IV SOLN: INTRAVENOUS | Status: DC | PRN

## 2023-09-19 MED ORDER — PHENYLEPHRINE HCL-NACL 20-0.9 MG/250ML-% IV SOLN: 0.0000 ug/min | INTRAVENOUS | Status: DC

## 2023-09-19 MED ORDER — ZOLPIDEM TARTRATE 5 MG PO TABS: 10.0000 mg | ORAL_TABLET | Freq: Every evening | ORAL | Status: DC | PRN

## 2023-09-19 MED ORDER — ROCURONIUM BROMIDE 10 MG/ML (PF) SYRINGE
PREFILLED_SYRINGE | INTRAVENOUS | Status: DC | PRN
  Administered 2023-09-19: 50 mg via INTRAVENOUS
  Administered 2023-09-19: 40 mg via INTRAVENOUS
  Administered 2023-09-19: 100 mg via INTRAVENOUS
  Administered 2023-09-19: 50 mg via INTRAVENOUS

## 2023-09-19 MED ORDER — PROTAMINE SULFATE 10 MG/ML IV SOLN
INTRAVENOUS | Status: AC
  Filled 2023-09-19: qty 25

## 2023-09-19 MED ORDER — VANCOMYCIN HCL IN DEXTROSE 1-5 GM/200ML-% IV SOLN
1000.0000 mg | Freq: Once | INTRAVENOUS | Status: AC
  Administered 2023-09-19: 1000 mg via INTRAVENOUS
  Filled 2023-09-19: qty 200

## 2023-09-19 MED ORDER — MORPHINE SULFATE (PF) 2 MG/ML IV SOLN
1.0000 mg | INTRAVENOUS | Status: DC | PRN
  Administered 2023-09-19: 2 mg via INTRAVENOUS
  Administered 2023-09-20: 1 mg via INTRAVENOUS
  Administered 2023-09-20: 2 mg via INTRAVENOUS
  Filled 2023-09-19 (×3): qty 1

## 2023-09-19 MED ORDER — ASPIRIN 81 MG PO CHEW
324.0000 mg | CHEWABLE_TABLET | Freq: Every day | ORAL | Status: DC
  Administered 2023-09-20: 324 mg
  Filled 2023-09-19: qty 4

## 2023-09-19 MED ORDER — DEXMEDETOMIDINE HCL IN NACL 400 MCG/100ML IV SOLN
0.0000 ug/kg/h | INTRAVENOUS | Status: DC
  Administered 2023-09-19: 0.8 ug/kg/h via INTRAVENOUS
  Filled 2023-09-19: qty 100

## 2023-09-19 MED ORDER — HEPARIN SODIUM (PORCINE) 1000 UNIT/ML IJ SOLN
INTRAMUSCULAR | Status: DC | PRN
  Administered 2023-09-19: 36000 [IU] via INTRAVENOUS

## 2023-09-19 MED ORDER — NOREPINEPHRINE 4 MG/250ML-% IV SOLN
0.0000 ug/min | INTRAVENOUS | Status: DC
Start: 2023-09-19 — End: 2023-09-20

## 2023-09-19 MED ORDER — ROSUVASTATIN CALCIUM 20 MG PO TABS
20.0000 mg | ORAL_TABLET | Freq: Every evening | ORAL | Status: DC
  Administered 2023-09-19 – 2023-09-22 (×4): 20 mg via ORAL
  Filled 2023-09-19 (×4): qty 1

## 2023-09-19 MED ORDER — PROTAMINE SULFATE 10 MG/ML IV SOLN
INTRAVENOUS | Status: AC
  Filled 2023-09-19: qty 5

## 2023-09-19 MED ORDER — 0.9 % SODIUM CHLORIDE (POUR BTL) OPTIME
TOPICAL | Status: DC | PRN
  Administered 2023-09-19: 5000 mL

## 2023-09-19 MED ORDER — NITROGLYCERIN IN D5W 200-5 MCG/ML-% IV SOLN
0.0000 ug/min | INTRAVENOUS | Status: DC
  Administered 2023-09-19: 5 ug/min via INTRAVENOUS

## 2023-09-19 MED ORDER — TRAMADOL HCL 50 MG PO TABS
50.0000 mg | ORAL_TABLET | ORAL | Status: DC | PRN
  Administered 2023-09-19: 100 mg via ORAL
  Administered 2023-09-20 (×2): 50 mg via ORAL
  Administered 2023-09-21 (×3): 100 mg via ORAL
  Filled 2023-09-19: qty 2
  Filled 2023-09-19: qty 1
  Filled 2023-09-19: qty 2
  Filled 2023-09-19: qty 1
  Filled 2023-09-19 (×2): qty 2

## 2023-09-19 MED ORDER — ~~LOC~~ CARDIAC SURGERY, PATIENT & FAMILY EDUCATION
Freq: Once | Status: DC
  Filled 2023-09-19: qty 1

## 2023-09-19 MED ORDER — EPINEPHRINE HCL 5 MG/250ML IV SOLN IN NS: 0.0000 ug/min | INTRAVENOUS | Status: DC

## 2023-09-19 MED ORDER — THROMBIN (RECOMBINANT) 20000 UNITS EX SOLR
CUTANEOUS | Status: AC
  Filled 2023-09-19: qty 20000

## 2023-09-19 MED ORDER — INSULIN REGULAR(HUMAN) IN NACL 100-0.9 UT/100ML-% IV SOLN: INTRAVENOUS | Status: DC

## 2023-09-19 MED ORDER — QUETIAPINE FUMARATE 100 MG PO TABS
100.0000 mg | ORAL_TABLET | Freq: Every day | ORAL | Status: DC
  Administered 2023-09-20: 100 mg via ORAL
  Filled 2023-09-19: qty 1

## 2023-09-19 MED ORDER — FENTANYL CITRATE (PF) 250 MCG/5ML IJ SOLN
INTRAMUSCULAR | Status: AC
  Filled 2023-09-19: qty 5

## 2023-09-19 MED ORDER — HEMOSTATIC AGENTS (NO CHARGE) OPTIME
TOPICAL | Status: DC | PRN
  Administered 2023-09-19: 1 via TOPICAL

## 2023-09-19 MED ORDER — ACETAMINOPHEN 160 MG/5ML PO SOLN: 650.0000 mg | Freq: Once | ORAL | Status: DC

## 2023-09-19 MED ORDER — MAGNESIUM SULFATE 4 GM/100ML IV SOLN
4.0000 g | Freq: Once | INTRAVENOUS | Status: AC
  Administered 2023-09-19: 4 g via INTRAVENOUS
  Filled 2023-09-19: qty 100

## 2023-09-19 MED ORDER — ROCURONIUM BROMIDE 10 MG/ML (PF) SYRINGE
PREFILLED_SYRINGE | INTRAVENOUS | Status: AC
  Filled 2023-09-19: qty 30

## 2023-09-19 MED ORDER — ALBUMIN HUMAN 5 % IV SOLN: INTRAVENOUS | Status: DC | PRN

## 2023-09-19 MED ORDER — ACETAMINOPHEN 160 MG/5ML PO SOLN
650.0000 mg | Freq: Once | ORAL | Status: AC
  Administered 2023-09-19: 650 mg via ORAL

## 2023-09-19 MED ORDER — MIDAZOLAM HCL 2 MG/2ML IJ SOLN: 2.0000 mg | INTRAMUSCULAR | Status: DC | PRN

## 2023-09-19 MED ORDER — METOPROLOL TARTRATE 12.5 MG HALF TABLET
12.5000 mg | ORAL_TABLET | Freq: Two times a day (BID) | ORAL | Status: DC
  Administered 2023-09-19: 12.5 mg via ORAL
  Filled 2023-09-19: qty 1

## 2023-09-19 MED ORDER — BISACODYL 5 MG PO TBEC
10.0000 mg | DELAYED_RELEASE_TABLET | Freq: Every day | ORAL | Status: DC
  Administered 2023-09-20: 10 mg via ORAL
  Filled 2023-09-19: qty 2

## 2023-09-19 MED ORDER — PHENYLEPHRINE HCL (PRESSORS) 10 MG/ML IV SOLN
INTRAVENOUS | Status: AC
  Filled 2023-09-19: qty 1

## 2023-09-19 MED ORDER — LIDOCAINE HCL (CARDIAC) PF 100 MG/5ML IV SOSY
PREFILLED_SYRINGE | INTRAVENOUS | Status: DC | PRN
  Administered 2023-09-19: 80 mg via INTRATRACHEAL

## 2023-09-19 MED ORDER — MIDAZOLAM HCL (PF) 10 MG/2ML IJ SOLN
INTRAMUSCULAR | Status: AC
  Filled 2023-09-19: qty 2

## 2023-09-19 MED ORDER — METOCLOPRAMIDE HCL 5 MG/ML IJ SOLN
10.0000 mg | Freq: Four times a day (QID) | INTRAMUSCULAR | Status: AC
  Administered 2023-09-19 – 2023-09-20 (×6): 10 mg via INTRAVENOUS
  Filled 2023-09-19 (×6): qty 2

## 2023-09-19 MED ORDER — SODIUM CHLORIDE 0.9 % IV SOLN: 250.0000 mL | INTRAVENOUS | Status: DC

## 2023-09-19 MED ORDER — CHLORHEXIDINE GLUCONATE 0.12 % MT SOLN
15.0000 mL | Freq: Once | OROMUCOSAL | Status: AC
  Administered 2023-09-19: 15 mL via OROMUCOSAL
  Filled 2023-09-19: qty 15

## 2023-09-19 MED ORDER — FENTANYL CITRATE (PF) 250 MCG/5ML IJ SOLN
INTRAMUSCULAR | Status: DC | PRN
  Administered 2023-09-19: 50 ug via INTRAVENOUS
  Administered 2023-09-19 (×3): 150 ug via INTRAVENOUS
  Administered 2023-09-19: 100 ug via INTRAVENOUS
  Administered 2023-09-19 (×6): 50 ug via INTRAVENOUS
  Administered 2023-09-19: 100 ug via INTRAVENOUS
  Administered 2023-09-19: 50 ug via INTRAVENOUS
  Administered 2023-09-19: 200 ug via INTRAVENOUS
  Administered 2023-09-19 (×2): 50 ug via INTRAVENOUS
  Administered 2023-09-19: 100 ug via INTRAVENOUS

## 2023-09-19 MED ORDER — ALBUMIN HUMAN 5 % IV SOLN
250.0000 mL | INTRAVENOUS | Status: DC | PRN
  Administered 2023-09-19 (×2): 12.5 g via INTRAVENOUS

## 2023-09-19 MED ORDER — PROTAMINE SULFATE 10 MG/ML IV SOLN
INTRAVENOUS | Status: DC | PRN
  Administered 2023-09-19: 300 mg via INTRAVENOUS

## 2023-09-19 MED ORDER — METOPROLOL TARTRATE 25 MG/10 ML ORAL SUSPENSION
12.5000 mg | Freq: Two times a day (BID) | ORAL | Status: DC
Start: 2023-09-19 — End: 2023-09-20

## 2023-09-19 MED ORDER — ACETAMINOPHEN 500 MG PO TABS
1000.0000 mg | ORAL_TABLET | Freq: Four times a day (QID) | ORAL | Status: DC
  Administered 2023-09-19 – 2023-09-23 (×12): 1000 mg via ORAL
  Filled 2023-09-19 (×13): qty 2

## 2023-09-19 MED ORDER — METOPROLOL TARTRATE 12.5 MG HALF TABLET
12.5000 mg | ORAL_TABLET | Freq: Once | ORAL | Status: AC
  Administered 2023-09-19: 12.5 mg via ORAL
  Filled 2023-09-19: qty 1

## 2023-09-19 MED ORDER — OXYCODONE HCL 5 MG PO TABS
5.0000 mg | ORAL_TABLET | ORAL | Status: DC | PRN
  Administered 2023-09-20: 5 mg via ORAL
  Administered 2023-09-20 – 2023-09-22 (×2): 10 mg via ORAL
  Filled 2023-09-19 (×2): qty 2
  Filled 2023-09-19: qty 1

## 2023-09-19 MED ORDER — MIDAZOLAM HCL (PF) 5 MG/ML IJ SOLN
INTRAMUSCULAR | Status: DC | PRN
  Administered 2023-09-19: 1 mg via INTRAVENOUS
  Administered 2023-09-19 (×2): 2 mg via INTRAVENOUS
  Administered 2023-09-19 (×2): 1 mg via INTRAVENOUS

## 2023-09-19 MED ORDER — PANTOPRAZOLE SODIUM 40 MG PO TBEC
40.0000 mg | DELAYED_RELEASE_TABLET | Freq: Every day | ORAL | Status: DC
  Administered 2023-09-21 – 2023-09-23 (×3): 40 mg via ORAL
  Filled 2023-09-19 (×3): qty 1

## 2023-09-19 MED ORDER — ASPIRIN 81 MG PO CHEW
324.0000 mg | CHEWABLE_TABLET | Freq: Once | ORAL | Status: AC
  Administered 2023-09-19: 324 mg via ORAL
  Filled 2023-09-19: qty 4

## 2023-09-19 MED ORDER — PHENYLEPHRINE 80 MCG/ML (10ML) SYRINGE FOR IV PUSH (FOR BLOOD PRESSURE SUPPORT)
PREFILLED_SYRINGE | INTRAVENOUS | Status: AC
  Filled 2023-09-19: qty 10

## 2023-09-19 MED ORDER — LIDOCAINE 2% (20 MG/ML) 5 ML SYRINGE
INTRAMUSCULAR | Status: AC
  Filled 2023-09-19: qty 5

## 2023-09-19 MED ORDER — ASPIRIN 325 MG PO TBEC: 325.0000 mg | DELAYED_RELEASE_TABLET | Freq: Every day | ORAL | Status: DC

## 2023-09-19 MED ORDER — CHLORHEXIDINE GLUCONATE 0.12 % MT SOLN
15.0000 mL | OROMUCOSAL | Status: AC
  Administered 2023-09-19: 15 mL via OROMUCOSAL
  Filled 2023-09-19: qty 15

## 2023-09-19 MED ORDER — ORAL CARE MOUTH RINSE: 15.0000 mL | OROMUCOSAL | Status: DC | PRN

## 2023-09-19 MED ORDER — PROPOFOL 10 MG/ML IV BOLUS
INTRAVENOUS | Status: AC
  Filled 2023-09-19: qty 20

## 2023-09-19 MED ORDER — SODIUM CHLORIDE 0.9% FLUSH
3.0000 mL | Freq: Two times a day (BID) | INTRAVENOUS | Status: DC
  Administered 2023-09-19: 3 mL via INTRAVENOUS
  Administered 2023-09-19 – 2023-09-20 (×2): 10 mL via INTRAVENOUS

## 2023-09-19 MED ORDER — METOPROLOL TARTRATE 5 MG/5ML IV SOLN
2.5000 mg | INTRAVENOUS | Status: DC | PRN
  Administered 2023-09-20: 2.5 mg via INTRAVENOUS
  Filled 2023-09-19: qty 5

## 2023-09-19 MED ORDER — DOCUSATE SODIUM 100 MG PO CAPS
200.0000 mg | ORAL_CAPSULE | Freq: Every day | ORAL | Status: DC
  Administered 2023-09-20: 200 mg via ORAL
  Filled 2023-09-19: qty 2

## 2023-09-19 MED ORDER — SODIUM CHLORIDE 0.9% FLUSH
3.0000 mL | Freq: Two times a day (BID) | INTRAVENOUS | Status: DC
  Administered 2023-09-20 (×2): 3 mL via INTRAVENOUS

## 2023-09-19 MED ORDER — POTASSIUM CHLORIDE 10 MEQ/50ML IV SOLN
10.0000 meq | INTRAVENOUS | Status: AC
  Administered 2023-09-19 (×3): 10 meq via INTRAVENOUS

## 2023-09-19 MED ORDER — CHLORHEXIDINE GLUCONATE 4 % EX SOLN: 30.0000 mL | CUTANEOUS | Status: DC

## 2023-09-19 MED ORDER — ACETAMINOPHEN 160 MG/5ML PO SOLN: 1000.0000 mg | Freq: Four times a day (QID) | ORAL | Status: DC

## 2023-09-19 MED ORDER — ONDANSETRON HCL 4 MG/2ML IJ SOLN: 4.0000 mg | Freq: Four times a day (QID) | INTRAMUSCULAR | Status: DC | PRN

## 2023-09-19 SURGICAL SUPPLY — 77 items
BAG DECANTER FOR FLEXI CONT (MISCELLANEOUS) ×1 IMPLANT
BLADE CLIPPER SURG (BLADE) ×1 IMPLANT
BLADE STERNUM SYSTEM 6 (BLADE) ×1 IMPLANT
BLADE SURG 11 STRL SS (BLADE) IMPLANT
BNDG ELASTIC 4INX 5YD STR LF (GAUZE/BANDAGES/DRESSINGS) IMPLANT
BNDG ELASTIC 4X5.8 VLCR STR LF (GAUZE/BANDAGES/DRESSINGS) ×1 IMPLANT
BNDG ELASTIC 6INX 5YD STR LF (GAUZE/BANDAGES/DRESSINGS) ×1 IMPLANT
BNDG GAUZE DERMACEA FLUFF 4 (GAUZE/BANDAGES/DRESSINGS) ×1 IMPLANT
CANISTER SUCT 3000ML PPV (MISCELLANEOUS) ×1 IMPLANT
CANNULA ARTERIAL VENT 3/8 20FR (CANNULA) IMPLANT
CANNULA MC2 2 STG 36/46 CONN (CANNULA) IMPLANT
CATH ROBINSON RED A/P 18FR (CATHETERS) ×2 IMPLANT
CATH THORACIC 28FR (CATHETERS) ×1 IMPLANT
CATH THORACIC 36FR (CATHETERS) ×1 IMPLANT
CATH THORACIC 36FR RT ANG (CATHETERS) ×1 IMPLANT
CLIP TI WIDE RED SMALL 24 (CLIP) IMPLANT
CONTAINER PROTECT SURGISLUSH (MISCELLANEOUS) ×2 IMPLANT
DERMABOND ADVANCED .7 DNX12 (GAUZE/BANDAGES/DRESSINGS) IMPLANT
DRAPE SRG 135X102X78XABS (DRAPES) ×1 IMPLANT
DRAPE WARM FLUID 44X44 (DRAPES) ×1 IMPLANT
DRSG COVADERM 4X14 (GAUZE/BANDAGES/DRESSINGS) ×1 IMPLANT
ELECT BLADE 4.0 EZ CLEAN MEGAD (MISCELLANEOUS) ×1 IMPLANT
ELECT CAUTERY BLADE 6.4 (BLADE) ×1 IMPLANT
ELECT LEAD UNIPOLAR MYOCARDIAL IMPLANT
ELECT REM PT RETURN 9FT ADLT (ELECTROSURGICAL) ×2 IMPLANT
ELECTRODE BLDE 4.0 EZ CLN MEGD (MISCELLANEOUS) IMPLANT
ELECTRODE REM PT RTRN 9FT ADLT (ELECTROSURGICAL) ×2 IMPLANT
FELT TEFLON 1X6 (MISCELLANEOUS) ×1 IMPLANT
GAUZE SPONGE 4X4 12PLY STRL (GAUZE/BANDAGES/DRESSINGS) ×2 IMPLANT
GLOVE ECLIPSE 7.0 STRL STRAW (GLOVE) ×2 IMPLANT
GLOVE ORTHO TXT STRL SZ7.5 (GLOVE) IMPLANT
GOWN STRL REUS W/ TWL LRG LVL3 (GOWN DISPOSABLE) ×4 IMPLANT
GOWN STRL REUS W/ TWL XL LVL3 (GOWN DISPOSABLE) ×1 IMPLANT
HEMOSTAT POWDER SURGIFOAM 1G (HEMOSTASIS) ×3 IMPLANT
HEMOSTAT SURGICEL 2X14 (HEMOSTASIS) ×1 IMPLANT
INSERT FOGARTY XLG (MISCELLANEOUS) IMPLANT
KIT BASIN OR (CUSTOM PROCEDURE TRAY) ×1 IMPLANT
KIT SUCTION CATH 14FR (SUCTIONS) ×1 IMPLANT
KIT TURNOVER KIT B (KITS) ×1 IMPLANT
KIT VASOVIEW HEMOPRO 2 VH 4000 (KITS) ×1 IMPLANT
KNIFE MICRO-UNI 3.5 30 DEG (BLADE) ×1 IMPLANT
NS IRRIG 1000ML POUR BTL (IV SOLUTION) ×5 IMPLANT
PACK E OPEN HEART (SUTURE) ×1 IMPLANT
PACK OPEN HEART (CUSTOM PROCEDURE TRAY) ×1 IMPLANT
PAD ARMBOARD 7.5X6 YLW CONV (MISCELLANEOUS) ×2 IMPLANT
PAD ELECT DEFIB RADIOL ZOLL (MISCELLANEOUS) ×1 IMPLANT
PENCIL BUTTON HOLSTER BLD 10FT (ELECTRODE) ×1 IMPLANT
POSITIONER HEAD DONUT 9IN (MISCELLANEOUS) ×1 IMPLANT
PUNCH AORTIC ROTATE 4.5MM 8IN (MISCELLANEOUS) ×1 IMPLANT
SET MPS 3-ND DEL (MISCELLANEOUS) IMPLANT
SPONGE T-LAP 18X18 ~~LOC~~+RFID (SPONGE) IMPLANT
STOPCOCK 4 WAY LG BORE MALE ST (IV SETS) IMPLANT
SUPPORT HEART JANKE-BARRON (MISCELLANEOUS) ×1 IMPLANT
SUT BONE WAX W31G (SUTURE) ×1 IMPLANT
SUT MNCRL AB 4-0 PS2 18 (SUTURE) IMPLANT
SUT PROLENE 3 0 SH1 36 (SUTURE) ×1 IMPLANT
SUT PROLENE 4-0 RB1 .5 CRCL 36 (SUTURE) IMPLANT
SUT PROLENE 5 0 C 1 36 (SUTURE) IMPLANT
SUT PROLENE 6 0 C 1 30 (SUTURE) IMPLANT
SUT PROLENE 7 0 BV 1 (SUTURE) IMPLANT
SUT PROLENE 7 0 BV1 MDA (SUTURE) ×1 IMPLANT
SUT PROLENE 8 0 BV175 6 (SUTURE) IMPLANT
SUT SILK 1 MH (SUTURE) IMPLANT
SUT SILK 2 0 SH CR/8 (SUTURE) IMPLANT
SUT STEEL STERNAL CCS#1 18IN (SUTURE) IMPLANT
SUT STEEL SZ 6 DBL 3X14 BALL (SUTURE) IMPLANT
SUT VIC AB 1 CTX36XBRD ANBCTR (SUTURE) ×2 IMPLANT
SUT VIC AB 2-0 CT1 TAPERPNT 27 (SUTURE) IMPLANT
SYSTEM SAHARA CHEST DRAIN ATS (WOUND CARE) ×1 IMPLANT
TAPE CLOTH SOFT 2X10 (GAUZE/BANDAGES/DRESSINGS) IMPLANT
TAPE PAPER 2X10 WHT MICROPORE (GAUZE/BANDAGES/DRESSINGS) IMPLANT
TOWEL GREEN STERILE (TOWEL DISPOSABLE) ×1 IMPLANT
TOWEL GREEN STERILE FF (TOWEL DISPOSABLE) ×1 IMPLANT
TRAY FOLEY SLVR 16FR TEMP STAT (SET/KITS/TRAYS/PACK) ×1 IMPLANT
TUBING LAP HI FLOW INSUFFLATIO (TUBING) ×1 IMPLANT
UNDERPAD 30X36 HEAVY ABSORB (UNDERPADS AND DIAPERS) ×1 IMPLANT
WATER STERILE IRR 1000ML POUR (IV SOLUTION) ×2 IMPLANT

## 2023-09-19 NOTE — Transfer of Care (Signed)
 Immediate Anesthesia Transfer of Care Note  Patient: [name]  Procedure(s) Performed: CORONARY ARTERY BYPASS GRAFTING (CABG) TIMES TWO USING LEFT INTERNAL MAMMARY ARTERY AND ENDOSCOPICALLY HARVESTED RIGHT GREATER SAPHENOUS VEIN (Chest) TRANSESOPHAGEAL ECHOCARDIOGRAM (TEE) (Chest)  Patient Location: ICU  Anesthesia Type:General  Level of Consciousness: sedated and Patient remains intubated per anesthesia plan  Airway & Oxygen Therapy: Patient remains intubated per anesthesia plan and Patient placed on Ventilator (see vital sign flow sheet for setting)  Post-op Assessment: Report given to RN and Post -op Vital signs reviewed and stable  Post vital signs: Reviewed and stable  Last Vitals:  Vitals Value Taken Time  BP 106/55 09/19/23 1215  Temp 36.1 C 09/19/23 1217  Pulse 92 09/19/23 1217  Resp 16 09/19/23 1217  SpO2 98 % 09/19/23 1217  Vitals shown include unfiled device data.  Last Pain:  Vitals:   09/19/23 0639  TempSrc:   PainSc: 0-No pain         Complications: No notable events documented.

## 2023-09-19 NOTE — Procedures (Signed)
 Extubation Procedure Note  Patient Details:   Name: [name] DOB: 20-Dec-1960 MRN: [id number]   Airway Documentation:    Vent end date: 09/19/23 Vent end time: 1520   Evaluation  O2 sats: stable throughout Complications: No apparent complications Patient did tolerate procedure well. Bilateral Breath Sounds: Clear, Diminished   Yes,  Per rapid wean protocol, pt was extubated to [place]. Prior to extubation pt's NIF= -28 VC= 1.6L with a positive cuff leak. Pt tolerated well with SVS, no stridor noted and pt was able to state his name.  Megan Mans 09/19/2023, 3:21 PM

## 2023-09-19 NOTE — Anesthesia Procedure Notes (Signed)
 Central Venous Catheter Insertion Performed by: Collene Schlichter, MD, anesthesiologist Start/End3/09/2023 7:00 AM, 09/19/2023 7:10 AM Patient location: Pre-op. Preanesthetic checklist: patient identified, IV checked, site marked, risks and benefits discussed, surgical consent, monitors and equipment checked, pre-op evaluation, timeout performed and anesthesia consent Position: Trendelenburg Lidocaine 1% used for infiltration and patient sedated Hand hygiene performed , maximum sterile barriers used  and Seldinger technique used Catheter size: 9 Fr Central line was placed.MAC introducer Procedure performed using ultrasound guided technique. Ultrasound Notes:anatomy identified, needle tip was noted to be adjacent to the nerve/plexus identified, no ultrasound evidence of intravascular and/or intraneural injection and image(s) printed for medical record Attempts: 1 Following insertion, line sutured, dressing applied and Biopatch. Post procedure assessment: free fluid flow, blood return through all ports and no air  Patient tolerated the procedure well with no immediate complications.

## 2023-09-19 NOTE — Anesthesia Procedure Notes (Signed)
 Procedure Name: Intubation Date/Time: 09/19/2023 7:44 AM  Performed by: Samara Deist, CRNAPre-anesthesia Checklist: Patient identified, Emergency Drugs available, Suction available and Patient being monitored Patient Re-evaluated:Patient Re-evaluated prior to induction Oxygen Delivery Method: Circle System Utilized Preoxygenation: Pre-oxygenation with 100% oxygen Induction Type: IV induction Ventilation: Mask ventilation without difficulty and Oral airway inserted - appropriate to patient size Laryngoscope Size: Mac and 4 Grade View: Grade I Tube type: Oral Tube size: 8.0 mm Number of attempts: 1 Airway Equipment and Method: Stylet and Oral airway Placement Confirmation: ETT inserted through vocal cords under direct vision, positive ETCO2 and breath sounds checked- equal and bilateral Secured at: 23 cm Tube secured with: Tape Dental Injury: Teeth and Oropharynx as per pre-operative assessment

## 2023-09-19 NOTE — Hospital Course (Signed)
 HPI: This is a 63 year old gentleman with a history of type 2 diabetes, hyperlipidemia, hypertension, previous smoking until December 2024, stroke and coronary artery disease who suffered an anterior STEMI in April 2021 treated with PCI/DES to the LAD.  His LVEF at the time of his MI was 35 to 40%.  Follow-up echo in April 2022 showed normalization of his ejection fraction to 55 to 60%.  He had a nuclear stress test on 07/01/2023 that was abnormal showing a large severe fixed defect involving the mid anterior septal, mid inferoseptal, apical anterior, apical septal, and apical segments consistent with infarct in the LAD territory.  No significant ischemia was identified.  Left ventricular ejection fraction was 51% and was felt to be an intermediate risk study.  He subsequent underwent cardiac catheterization on 08/12/2023 showing significant in-stent restenosis in the LAD up to 80% with the LAD disease extending proximally and distally to the stent.  There is also a large first diagonal branch that was jailed by the stent and had 95% ostial stenosis.  The left circumflex and RCA had no significant disease.  Right heart filling pressures were normal.  Pulm artery pressure and cardiac output were normal.   He reports exertional fatigue and tiredness and has to take breaks when he is exerting himself.  He denies any shortness of breath.  He has had no chest discomfort and no pain in his neck, jaw, or arms.  He denies dizziness and syncope.  Has had no peripheral edema.  He reports a knocking sensation from his heartbeat at night and sometimes when he is exerting himself.  Dr. Laneta Simmers discussed the need for coronary artery bypass grafting surgery. Potential risks, benefits, and complications of the surgery were discussed with the patient and he agreed to proceed with surgery. Pre operative carotid duplex US showed no significant internal carotid artery stenosis bilaterally.  Hospital Course: Patient underwent a  CABG x 2. He was transported from the OR to [facility] ICU in stable condition. He was extubated the afternoon of surgery without complication. Drips were weaned as hemodynamics tolerated. Theone Murdoch catheter, arterial line, epicardial pacing wires and chest tubes were removed on POD1 without complication. He was started on Lopressor. CBGs were controlled on IV Insulin, he was transitioned to semglee and SSI.

## 2023-09-19 NOTE — Anesthesia Procedure Notes (Signed)
 Central Venous Catheter Insertion Performed by: Collene Schlichter, MD, anesthesiologist Start/End3/09/2023 7:10 AM, 09/19/2023 7:15 AM Patient location: Pre-op. Preanesthetic checklist: patient identified, IV checked, site marked, risks and benefits discussed, surgical consent, monitors and equipment checked, pre-op evaluation and timeout performed Position: Trendelenburg Hand hygiene performed  and maximum sterile barriers used  Total catheter length 100. PA cath was placed.Swan type:thermodilution PA Cath depth:51 Procedure performed without using ultrasound guided technique. Attempts: 1 Patient tolerated the procedure well with no immediate complications.

## 2023-09-19 NOTE — Op Note (Signed)
 CARDIOVASCULAR SURGERY OPERATIVE NOTE  09/19/2023  Surgeon:  Alleen Borne, MD  First Assistant: Doree Fudge,  PA-C:   An experienced assistant was required given the complexity of this surgery and the standard of surgical care. The assistant was needed for endoscopic vein harvest, exposure, dissection, suctioning, retraction of delicate tissues and sutures, instrument exchange and for overall help during this procedure.   Preoperative Diagnosis:  Severe single-vessel coronary artery disease   Postoperative Diagnosis:  Same   Procedure:  Median Sternotomy Extracorporeal circulation 3.   Coronary artery bypass grafting x 2  Left internal mammary artery graft to the LAD SVG to diagonal   4.   Endoscopic vein harvest from the right leg   Anesthesia:  General Endotracheal   Clinical History/Surgical Indication:  The patient is a 63 year old gentleman with a history of type 2 diabetes, hyperlipidemia, hypertension, previous smoking until December 2024, stroke and coronary artery disease who suffered an anterior STEMI in April 2021 treated with PCI/DES to the LAD. His LVEF at the time of his MI was 35 to 40%. Follow-up echo in April 2022 showed normalization of his ejection fraction to 55 to 60%. He had a nuclear stress test on 07/01/2023 that was abnormal showing a large severe fixed defect involving the mid anterior septal, mid inferoseptal, apical anterior, apical septal, and apical segments consistent with infarct in the LAD territory. No significant ischemia was identified. Left ventricular ejection fraction was 51% and was felt to be an intermediate risk study. He subsequent underwent cardiac catheterization on 08/12/2023 showing significant in-stent restenosis in the LAD up to 80% with the LAD disease extending proximally and distally to the stent. There is also a large first diagonal  branch that was jailed by the stent and had 95% ostial stenosis. The left circumflex and RCA had no significant disease. Right heart filling pressures were normal. Pulm artery pressure and cardiac output were normal. He has developed progressive exertional fatigue and tiredness which is likely ischemia mediated. I think the best treatment is to proceed with coronary bypass graft surgery to the LAD and diagonal branch. I discussed the operative procedure with the patient and his ex-fiance including alternatives, benefits and risks; including but not limited to bleeding, blood transfusion, infection, stroke, myocardial infarction, graft failure, heart block requiring a permanent pacemaker, organ dysfunction, and death. Carin Hock understands and agrees to proceed.   Preparation:  The patient was seen in the preoperative holding area and the correct patient, correct operation were confirmed with the patient after reviewing the medical record and catheterization. The consent was signed by me. Preoperative antibiotics were given. A pulmonary arterial line and radial arterial line were placed by the anesthesia team. The patient was taken back to the operating room and positioned supine on the operating room table. After being placed under general endotracheal anesthesia by the anesthesia team a foley catheter was placed. The neck, chest, abdomen, and both legs were prepped with betadine soap and solution and draped in the usual sterile manner. A surgical time-out was taken and the correct patient and operative procedure were confirmed with the nursing and anesthesia staff.   Cardiopulmonary Bypass:  A median sternotomy was performed. The pericardium was opened in the midline. Right ventricular function appeared normal. The ascending aorta was of normal size and had no palpable plaque. There were no contraindications to aortic cannulation or cross-clamping. The patient was fully systemically heparinized and  the ACT was maintained > 400 sec. The proximal aortic arch was  cannulated with a 20 F aortic cannula for arterial inflow. Venous cannulation was performed via the right atrial appendage using a two-staged venous cannula. An antegrade cardioplegia/vent cannula was inserted into the mid-ascending aorta. Aortic occlusion was performed with a single cross-clamp. Systemic cooling to 32 degrees Centigrade and topical cooling of the heart with iced saline were used. Hyperkalemic antegrade cold blood cardioplegia was used to induce diastolic arrest and was then given at about 20 minute intervals throughout the period of arrest to maintain myocardial temperature at or below 10 degrees centigrade. A temperature probe was inserted into the interventricular septum and an insulating pad was placed in the pericardium.   Left internal mammary artery harvest:  The left side of the sternum was retracted using the Rultract retractor. The left internal mammary artery was harvested as a pedicle graft. All side branches were clipped. It was a medium-sized vessel of good quality with excellent blood flow. It was ligated distally and divided. It was sprayed with topical papaverine solution to prevent vasospasm.   Endoscopic vein harvest: performed by Doree Fudge, PA-C.  The right greater saphenous vein was harvested endoscopically through a 2 cm incision medial to the right knee. It was harvested from the thigh. It was a medium-sized vein of good quality. The side branches were all ligated with 4-0 silk ties.    Coronary arteries:  The coronary arteries were examined.  LAD:  large vessel that was heavily diseased in proximal portion but the remainder of the vessel had minimal disease. The diagonal was a large vessel with no distal disease. LCX: no stenosis on cath RCA: no stenosis on cath   Grafts: performed by me and assisted by Doree Fudge, PA-C.  LIMA to the LAD: 2.5 mm. It was sewn end to side  using 8-0 prolene continuous suture. SVG to diagonal:  1.75 mm. It was sewn end to side using 7-0 prolene continuous suture.   The proximal vein graft anastomosis was performed to the mid-ascending aorta using continuous 6-0 prolene suture. A graft marker was placed around the proximal anastomosis.   Completion:  The patient was rewarmed to 37 degrees Centigrade. The clamp was removed from the LIMA pedicle and there was rapid warming of the septum and return of ventricular fibrillation. The crossclamp was removed with a time of 43 minutes. There was spontaneous return of sinus rhythm. The distal and proximal anastomoses were checked for hemostasis. The position of the grafts was satisfactory. Two temporary epicardial pacing wires were placed on the right atrium and two on the right ventricle. The patient was weaned from CPB without difficulty on no inotropes. CPB time was 60 minutes. Cardiac output was 5 LPM. TEE showed unchanged mild LV systolic dysfunction, trivial MR. Heparin was fully reversed with protamine and the aortic and venous cannulas removed. Hemostasis was achieved. Mediastinal and left pleural drainage tubes were placed. The sternum was closed with double #6 stainless steel wires. The fascia was closed with continuous # 1 vicryl suture. The subcutaneous tissue was closed with 2-0 vicryl continuous suture. The skin was closed with 3-0 vicryl subcuticular suture. All sponge, needle, and instrument counts were reported correct at the end of the case. Dry sterile dressings were placed over the incisions and around the chest tubes which were connected to pleurevac suction. The patient was then transported to the surgical intensive care unit in stable condition.

## 2023-09-19 NOTE — Discharge Instructions (Signed)
 Discharge Instructions:  1. You may shower, please wash incisions daily with soap and water and keep dry.  If you wish to cover wounds with dressing you may do so but please keep clean and change daily.  No tub baths or swimming until incisions have completely healed.  If your incisions become red or develop any drainage please call our office at [phone]  2. No Driving until cleared by Dr. Sharee Pimple office and you are no longer using narcotic pain medications  3. Monitor your weight daily.. Please use the same scale and weigh at same time... If you gain 5-10 lbs in 48 hours with associated lower extremity swelling, please contact our office at [phone]  4. Fever of 101.5 for at least 24 hours with no source, please contact our office at [phone]  5. Activity- up as tolerated, please walk at least 3 times per day.  Avoid strenuous activity, no lifting, pushing, or pulling with your arms over 8-10 lbs for a minimum of 6 weeks  6. If any questions or concerns arise, please do not hesitate to contact our office at [phone]

## 2023-09-19 NOTE — Interval H&P Note (Signed)
 History and Physical Interval Note:  09/19/2023 7:06 AM  [name]  has presented today for surgery, with the diagnosis of CAD.  The various methods of treatment have been discussed with the patient and family. After consideration of risks, benefits and other options for treatment, the patient has consented to  Procedure(s): CORONARY ARTERY BYPASS GRAFTING (CABG) (N/A) TRANSESOPHAGEAL ECHOCARDIOGRAM (TEE) (N/A) as a surgical intervention.  The patient's history has been reviewed, patient examined, no change in status, stable for surgery.  I have reviewed the patient's chart and labs.  Questions were answered to the patient's satisfaction.     Alleen Borne

## 2023-09-19 NOTE — Anesthesia Procedure Notes (Signed)
 Arterial Line Insertion Performed by: CRNA  Patient location: Pre-op. Preanesthetic checklist: patient identified, IV checked, site marked, risks and benefits discussed, surgical consent, monitors and equipment checked, pre-op evaluation, timeout performed and anesthesia consent Lidocaine 1% used for infiltration and patient sedated Left, radial was placed Catheter size: 20 G Hand hygiene performed , maximum sterile barriers used  and Seldinger technique used Allen's test indicative of satisfactory collateral circulation Attempts: 1 Procedure performed without using ultrasound guided technique. Following insertion, dressing applied and Biopatch. Post procedure assessment: normal  Patient tolerated the procedure well with no immediate complications.

## 2023-09-19 NOTE — Brief Op Note (Signed)
 09/19/2023  10:32 AM  PATIENT:  Carin Hock  63 y.o. male  PRE-OPERATIVE DIAGNOSIS:  CORONARY ARTERY DISEASE  POST-OPERATIVE DIAGNOSIS:  CORONARY ARTERY DISEASE  PROCEDURE:  TRANSESOPHAGEAL ECHOCARDIOGRAM (TEE), CORONARY ARTERY BYPASS GRAFTING (CABG) TIMES TWO (LIMA to LAD and SVG to DIAGONAL) USING LEFT INTERNAL MAMMARY ARTERY AND ENDOSCOPICALLY HARVESTED RIGHT THIGH GREATER SAPHENOUS VEIN   Vein harvest time: 15 min Vein prep time: 9 min  SURGEON:  Surgeons and Role:    Alleen Borne, MD - Primary  PHYSICIAN ASSISTANT: Doree Fudge PA-C  ASSISTANTS: Dorthey Sawyer RNFA   ANESTHESIA:   general  EBL:  Per anesthesia, perfusion record  DRAINS:  Chest tubes placed in the mediastinal and pleural spaces    COUNTS CORRECT:  YES  DICTATION: .Dragon Dictation  PLAN OF CARE: Admit to inpatient   PATIENT DISPOSITION:  ICU - intubated and hemodynamically stable.   Delay start of Pharmacological VTE agent (>24hrs) due to surgical blood loss or risk of bleeding: no  BASELINE WEIGHT: 100.2

## 2023-09-19 NOTE — Progress Notes (Signed)
 Patient ID: [name], male   DOB: 01/10/1961, 63 y.o.   MRN: [id number]   TCTS Evening Rounds:   Hemodynamically stable  CI = 3.0  Extubated and alert.  Urine output good  CT output low  CBC    Component Value Date/Time   WBC 12.9 (H) 09/19/2023 1220   RBC 3.26 (L) 09/19/2023 1220   HGB 10.5 (L) 09/19/2023 1624   HGB 13.8 08/09/2023 1603   HCT 31.0 (L) 09/19/2023 1624   HCT 40.2 08/09/2023 1603   PLT 147 (L) 09/19/2023 1220   PLT 249 08/09/2023 1603   MCV 94.5 09/19/2023 1220   MCV 96 08/09/2023 1603   MCV 88 10/29/2014 0440   MCH 32.5 09/19/2023 1220   MCHC 34.4 09/19/2023 1220   RDW 11.9 09/19/2023 1220   RDW 11.7 08/09/2023 1603   RDW 14.0 10/29/2014 0440   LYMPHSABS 2.7 05/20/2023 1040   LYMPHSABS 3.0 10/29/2014 0440   MONOABS 0.8 03/17/2022 1619   MONOABS 0.6 10/29/2014 0440   EOSABS 0.2 05/20/2023 1040   EOSABS 0.3 10/29/2014 0440   BASOSABS 0.1 05/20/2023 1040   BASOSABS 0.1 10/29/2014 0440     BMET    Component Value Date/Time   NA 140 09/19/2023 1624   NA 141 08/09/2023 1603   NA 135 10/30/2014 0605   K 4.6 09/19/2023 1624   K 3.2 (L) 10/30/2014 0605   CL 106 09/19/2023 1102   CL 106 10/30/2014 0605   CO2 22 09/15/2023 1500   CO2 25 10/30/2014 0605   GLUCOSE 124 (H) 09/19/2023 1102   GLUCOSE 133 (H) 10/30/2014 0605   BUN 6 09/19/2023 1102   BUN 14 08/09/2023 1603   BUN <5 10/30/2014 0605   CREATININE 0.70 09/19/2023 1102   CREATININE 0.63 10/30/2014 0605   CREATININE 0.76 10/11/2013 1521   CALCIUM 9.2 09/15/2023 1500   CALCIUM 8.2 (L) 10/30/2014 0605   EGFR 99 08/09/2023 1603   GFRNONAA >60 09/15/2023 1500   GFRNONAA >60 10/30/2014 0605     A/P:  Stable postop course. Continue current plans

## 2023-09-19 NOTE — Plan of Care (Signed)
  Problem: Clinical Measurements: Goal: Will remain free from infection Outcome: Progressing Goal: Diagnostic test results will improve Outcome: Progressing Goal: Respiratory complications will improve Outcome: Progressing Goal: Cardiovascular complication will be avoided Outcome: Progressing   Problem: Activity: Goal: Risk for activity intolerance will decrease Outcome: Progressing   Problem: Coping: Goal: Level of anxiety will decrease Outcome: Progressing   

## 2023-09-20 ENCOUNTER — Inpatient Hospital Stay (HOSPITAL_COMMUNITY)

## 2023-09-20 ENCOUNTER — Encounter (HOSPITAL_COMMUNITY): Payer: Self-pay | Admitting: Surgery

## 2023-09-20 LAB — BASIC METABOLIC PANEL
Anion gap: 7 (ref 5–15)
Anion gap: 11 (ref 5–15)
BUN: 6 mg/dL (ref 6–20)
BUN: 8 mg/dL (ref 6–20)
CO2: 22 mmol/L (ref 22–32)
CO2: 22 mmol/L (ref 22–32)
Calcium: 7.8 mg/dL — ABNORMAL LOW (ref 8.9–10.3)
Calcium: 7.8 mg/dL — ABNORMAL LOW (ref 8.9–10.3)
Chloride: 107 mmol/L (ref 98–111)
Chloride: 99 mmol/L (ref 98–111)
Creatinine, Ser: 0.77 mg/dL (ref 0.61–1.24)
Creatinine, Ser: 0.98 mg/dL (ref 0.61–1.24)
GFR, Estimated: >60 mL/min (ref >60)
GFR, Estimated: >60 mL/min (ref >60)
Glucose, Bld: 116 mg/dL — ABNORMAL HIGH (ref 70–99)
Glucose, Bld: 171 mg/dL — ABNORMAL HIGH (ref 70–99)
Potassium: 3.9 mmol/L (ref 3.5–5.1)
Potassium: 4 mmol/L (ref 3.5–5.1)
Sodium: 136 mmol/L (ref 135–145)
Sodium: 132 mmol/L — ABNORMAL LOW (ref 135–145)

## 2023-09-20 LAB — GLUCOSE, CAPILLARY
Glucose-Capillary: 107 mg/dL — ABNORMAL HIGH (ref 70–99)
Glucose-Capillary: 100 mg/dL — ABNORMAL HIGH (ref 70–99)
Glucose-Capillary: 113 mg/dL — ABNORMAL HIGH (ref 70–99)
Glucose-Capillary: 120 mg/dL — ABNORMAL HIGH (ref 70–99)
Glucose-Capillary: 144 mg/dL — ABNORMAL HIGH (ref 70–99)
Glucose-Capillary: 103 mg/dL — ABNORMAL HIGH (ref 70–99)
Glucose-Capillary: 131 mg/dL — ABNORMAL HIGH (ref 70–99)
Glucose-Capillary: 138 mg/dL — ABNORMAL HIGH (ref 70–99)
Glucose-Capillary: 126 mg/dL — ABNORMAL HIGH (ref 70–99)
Glucose-Capillary: 118 mg/dL — ABNORMAL HIGH (ref 70–99)
Glucose-Capillary: 185 mg/dL — ABNORMAL HIGH (ref 70–99)
Glucose-Capillary: 124 mg/dL — ABNORMAL HIGH (ref 70–99)

## 2023-09-20 LAB — CBC
HCT: 32.4 % — ABNORMAL LOW (ref 39.0–52.0)
HCT: 34.3 % — ABNORMAL LOW (ref 39.0–52.0)
Hemoglobin: 11.1 g/dL — ABNORMAL LOW (ref 13.0–17.0)
Hemoglobin: 11.4 g/dL — ABNORMAL LOW (ref 13.0–17.0)
MCH: 32.8 pg (ref 26.0–34.0)
MCH: 32.6 pg (ref 26.0–34.0)
MCHC: 34.3 g/dL (ref 30.0–36.0)
MCHC: 33.2 g/dL (ref 30.0–36.0)
MCV: 95.9 fL (ref 80.0–100.0)
MCV: 98 fL (ref 80.0–100.0)
Platelets: 141 10*3/uL — ABNORMAL LOW (ref 150–400)
Platelets: 139 10*3/uL — ABNORMAL LOW (ref 150–400)
RBC: 3.38 MIL/uL — ABNORMAL LOW (ref 4.22–5.81)
RBC: 3.5 MIL/uL — ABNORMAL LOW (ref 4.22–5.81)
RDW: 12.3 % (ref 11.5–15.5)
RDW: 12.5 % (ref 11.5–15.5)
WBC: 9.2 10*3/uL (ref 4.0–10.5)
WBC: 8.4 10*3/uL (ref 4.0–10.5)
nRBC: 0 % (ref 0.0–0.2)
nRBC: 0 % (ref 0.0–0.2)

## 2023-09-20 LAB — ECHO INTRAOPERATIVE TEE
Height: 68 in
Weight: 3534.41 [oz_av]

## 2023-09-20 LAB — MAGNESIUM
Magnesium: 2.2 mg/dL (ref 1.7–2.4)
Magnesium: 2.2 mg/dL (ref 1.7–2.4)

## 2023-09-20 MED ORDER — ASPIRIN 81 MG PO CHEW
324.0000 mg | CHEWABLE_TABLET | Freq: Every day | ORAL | Status: DC
Start: 2023-09-21 — End: 2023-09-21

## 2023-09-20 MED ORDER — FUROSEMIDE 10 MG/ML IJ SOLN
40.0000 mg | Freq: Two times a day (BID) | INTRAMUSCULAR | Status: AC
  Administered 2023-09-20 (×2): 40 mg via INTRAVENOUS
  Filled 2023-09-20 (×2): qty 4

## 2023-09-20 MED ORDER — KETOROLAC TROMETHAMINE 15 MG/ML IJ SOLN
15.0000 mg | Freq: Four times a day (QID) | INTRAMUSCULAR | Status: DC | PRN
  Administered 2023-09-20 – 2023-09-21 (×2): 15 mg via INTRAVENOUS
  Filled 2023-09-20 (×2): qty 1

## 2023-09-20 MED ORDER — INSULIN GLARGINE 100 UNIT/ML ~~LOC~~ SOLN
20.0000 [IU] | Freq: Every day | SUBCUTANEOUS | Status: DC
  Administered 2023-09-20 – 2023-09-23 (×4): 20 [IU] via SUBCUTANEOUS
  Filled 2023-09-20 (×4): qty 0.2

## 2023-09-20 MED ORDER — METOPROLOL TARTRATE 25 MG PO TABS
25.0000 mg | ORAL_TABLET | Freq: Two times a day (BID) | ORAL | Status: DC
  Administered 2023-09-20 – 2023-09-22 (×6): 25 mg via ORAL
  Filled 2023-09-20 (×6): qty 1

## 2023-09-20 MED ORDER — METOPROLOL TARTRATE 25 MG/10 ML ORAL SUSPENSION: 25.0000 mg | Freq: Two times a day (BID) | ORAL | Status: DC

## 2023-09-20 MED ORDER — INSULIN ASPART 100 UNIT/ML IJ SOLN
0.0000 [IU] | INTRAMUSCULAR | Status: DC
  Administered 2023-09-20: 2 [IU] via SUBCUTANEOUS
  Administered 2023-09-20: 4 [IU] via SUBCUTANEOUS

## 2023-09-20 MED ORDER — POTASSIUM CHLORIDE CRYS ER 20 MEQ PO TBCR
20.0000 meq | EXTENDED_RELEASE_TABLET | Freq: Two times a day (BID) | ORAL | Status: AC
  Administered 2023-09-20 (×2): 20 meq via ORAL
  Filled 2023-09-20 (×2): qty 1

## 2023-09-20 NOTE — TOC Initial Note (Signed)
 Transition of Care [facility]) - Initial/Assessment Note    Patient Details  Name: [name] MRN: [id number] Date of Birth: 04-05-1961  Transition of Care [facility]) CM/SW Contact:    Gala Lewandowsky, RN Phone Number: 09/20/2023, 11:56 AM  Clinical Narrative: Patient presented for chest pain-POD-1 CABG. PTA patient was from home alone. Patient states he will have support of neighbors if he needs meal prepared. He states he has an ex-fiance that may can assist as needed as well. Case Manager discussed possible home health services and the patient adamantly declines home health services. Patient states he has two kittens and is afraid to have them get out of the home. Patient states this was discussed in the office prior to surgery as well. PT/OT to work with the patient today. Patient has DME cane, bedside commode, and rolling walker in the home. Case Manager will continue to follow for additional transition of care needs as he progresses.                 Expected Discharge Plan: Home/Self Care Barriers to Discharge: Continued Medical Work up   Patient Goals and CMS Choice Patient states their goals for this hospitalization and ongoing recovery are:: Patient states he will be able to return home once stable. He declines all home health   Choice offered to / list presented to : NA      Expected Discharge Plan and Services In-house Referral: NA   Post Acute Care Choice: NA                     DME Agency: NA   Prior Living Arrangements/Services   Lives with:: Self Patient language and need for interpreter reviewed:: Yes Do you feel safe going back to the place where you live?: Yes      Need for Family Participation in Patient Care: Yes (Comment) Care giver support system in place?: No (comment) Current home services: DME (rolling walker, cane, bedside commode,) Criminal Activity/Legal Involvement Pertinent to Current Situation/Hospitalization: No - Comment as  needed  Permission Sought/Granted Permission sought to share information with : Family Supports, Case Manager   Emotional Assessment Appearance:: Appears stated age Attitude/Demeanor/Rapport: Engaged Affect (typically observed): Appropriate Orientation: : Oriented to Self, Oriented to Place, Oriented to  Time, Oriented to Situation Alcohol / Substance Use: Not Applicable Psych Involvement: No (comment)  Admission diagnosis:  Coronary artery disease [I25.10] Patient Active Problem List   Diagnosis Date Noted   Coronary artery disease 09/19/2023   Annual physical exam 05/20/2023   Screen for colon cancer 05/20/2023   History of vitamin D deficiency 05/20/2023   Acute reaction to situational stress 05/20/2023   Psychophysiological insomnia 03/10/2022   Hypertension associated with diabetes (HCC) 11/25/2021   Hyperlipidemia associated with type 2 diabetes mellitus (HCC) 11/25/2021   Diabetes mellitus (HCC) 11/25/2021   History of stroke 10/21/2020   Chronic systolic congestive heart failure (HCC) 11/30/2019   Coronary artery disease involving native coronary artery 11/30/2019   History of ST elevation myocardial infarction (STEMI) 10/27/2019   Psoriatic arthritis (HCC) 08/25/2017   History of colon cancer 01/02/2010   Esophageal reflux 11/04/2009   PCP:  Sallee Provencal, FNP Pharmacy:   [id number] [place], [place] - [address] AT [facility] OF [address] [place] [address] Phone: [phone] Fax: [phone]  Social Drivers of Health (SDOH) Social History: SDOH Screenings   Food Insecurity: No Food Insecurity (  05/18/2023)  Housing: Low Risk  (05/18/2023)  Transportation Needs: No Transportation Needs (05/18/2023)  Alcohol Screen: Low Risk  (05/18/2023)  Depression (PHQ2-9): Low Risk  (11/12/2022)  Financial Resource Strain: Low Risk  (05/18/2023)  Physical Activity: Sufficiently Active (05/18/2023)  Social  Connections: Socially Isolated (05/18/2023)  Stress: No Stress Concern Present (05/18/2023)  Tobacco Use: Medium Risk (09/19/2023)    Readmission Risk Interventions     No data to display

## 2023-09-20 NOTE — Progress Notes (Signed)
 1 Day Post-Op Procedure(s) (LRB): CORONARY ARTERY BYPASS GRAFTING (CABG) TIMES TWO USING LEFT INTERNAL MAMMARY ARTERY AND ENDOSCOPICALLY HARVESTED RIGHT GREATER SAPHENOUS VEIN (N/A) TRANSESOPHAGEAL ECHOCARDIOGRAM (TEE) (N/A) Subjective:  Had a rough night with pain.  Objective: Vital signs in last 24 hours: Temp:  [96.8 F (36 C)-101.5 F (38.6 C)] 100.4 F (38 C) (03/04 0630) Pulse Rate:  [85-115] 104 (03/04 0630) Cardiac Rhythm: Sinus tachycardia (03/03 2000) Resp:  [12-30] 12 (03/04 0630) BP: (106-111)/(55-73) 111/73 (03/03 1719) SpO2:  [98 %-100 %] 99 % (03/04 0630) Arterial Line BP: (93-184)/(41-84) 111/43 (03/04 0630) FiO2 (%):  [40 %-50 %] 40 % (03/03 1431) Weight:  [103.5 kg] 103.5 kg (03/04 0445)  Hemodynamic parameters for last 24 hours: PAP: (14-45)/(3-24) 20/6 CO:  [4.5 L/min-7.6 L/min] 7.1 L/min CI:  [2.1 L/min/m2-3.6 L/min/m2] 3.3 L/min/m2  Intake/Output from previous day: 03/03 0701 - 03/04 0700 In: 6348.9 [P.O.:360; I.V.:3321.4; Blood:485; IV Piggyback:2182.5] Out: 3256 [Urine:2130; Blood:590; Chest Tube:536] Intake/Output this shift: Total I/O In: 928.7 [P.O.:360; I.V.:168.6; IV Piggyback:400.1] Out: 880 [Urine:580; Chest Tube:300]  General appearance: alert and cooperative Neurologic: intact Heart: regular rate and rhythm Lungs: clear to auscultation bilaterally Extremities: edema mild Wound: dressings dry  Lab Results: Recent Labs    09/19/23 1805 09/20/23 0432  WBC 13.0* 9.2  HGB 11.2* 11.1*  HCT 33.5* 32.4*  PLT 163 141*   BMET:  Recent Labs    09/19/23 1805 09/20/23 0432  NA 137 136  K 4.1 3.9  CL 110 107  CO2 23 22  GLUCOSE 121* 116*  BUN 6 6  CREATININE 0.83 0.77  CALCIUM 7.7* 7.8*    PT/INR:  Recent Labs    09/19/23 1220  LABPROT 18.1*  INR 1.5*   ABG    Component Value Date/Time   PHART 7.332 (L) 09/19/2023 1624   HCO3 24.1 09/19/2023 1624   TCO2 25 09/19/2023 1624   ACIDBASEDEF 2.0 09/19/2023 1624   O2SAT 98  09/19/2023 1624   CBG (last 3)  Recent Labs    09/20/23 0301 09/20/23 0428 09/20/23 0535  GLUCAP 113* 120* 144*   CXR: ok  ECG: pending Assessment/Plan: S/P Procedure(s) (LRB): CORONARY ARTERY BYPASS GRAFTING (CABG) TIMES TWO USING LEFT INTERNAL MAMMARY ARTERY AND ENDOSCOPICALLY HARVESTED RIGHT GREATER SAPHENOUS VEIN (N/A) TRANSESOPHAGEAL ECHOCARDIOGRAM (TEE) (N/A)  POD 1 Hemodynamically stable in sinus rhythm. HR 103. Will increase Lopressor to 25 bid.  Wt is 8 lbs over preop. Start diuresis.  DC swan, arterial line.  DC pacing wires, wait two hrs and remove chest tubes.  Glucose under good control. Transition to [facility] and SSI.  IS, OOB, mobilize.       LOS: 1 day    Alleen Borne 09/20/2023

## 2023-09-20 NOTE — Evaluation (Signed)
 Physical Therapy Evaluation Patient Details Name: [name] MRN: [id number] DOB: [birth year] Today's Date: 09/20/2023  History of Present Illness  Pt is a 63 year old gentleman who underwent CABGx2 on 3/3. PMH: type 2 diabetes, hyperlipidemia, hypertension, previous smoking until December 2024, stroke and CAD who suffered an anterior STEMI in April 2021 treated with PCI/DES to the LAD.   Clinical Impression  Pt admitted with above. PTA pt was living alone, working, and indep without AD. Pt reports his ex-fiance [name] will be assisting him upon d/c. Pt doing exceptionally well and was able to amb 400' without AD this date. Anticipate pt to progress well and not need follow up PT upon d/c. Acute PT to follow while in hospital.        If plan is discharge home, recommend the following:     Can travel by private vehicle        Equipment Recommendations None recommended by PT  Recommendations for Other Services       Functional Status Assessment       Precautions / Restrictions Precautions Precautions: Sternal Precaution Booklet Issued: No Precaution/Restrictions Comments: educated on sternal precautions, pt with good verbal understanding and adherence Restrictions Weight Bearing Restrictions Per Provider Order: Yes RUE Weight Bearing Per Provider Order: Non weight bearing LUE Weight Bearing Per Provider Order: Non weight bearing      Mobility  Bed Mobility Overal bed mobility: Needs Assistance Bed Mobility: Supine to Sit     Supine to sit: Contact guard, HOB elevated     General bed mobility comments: held onto heart pillow, brought legs over edge of bed and used abdominal muscles to elevate trunk from [facility] elevation    Transfers Overall transfer level: Needs assistance Equipment used: None Transfers: Sit to/from Stand Sit to Stand: Contact guard assist           General transfer comment: contact guard for safety and line management, held onto heart pillow and  used forward walking momentum    Ambulation/Gait Ambulation/Gait assistance: Contact guard assist Gait Distance (Feet): 400 Feet Assistive device: None Gait Pattern/deviations: Step-through pattern, Decreased stride length, Wide base of support Gait velocity: dec Gait velocity interpretation: <1.8 ft/sec, indicate of risk for recurrent falls   General Gait Details: dec cadence, no LOB, mild SOB, VSS, SPO2 > 95% on RA.  Stairs            Wheelchair Mobility     Tilt Bed    Modified Rankin (Stroke Patients Only)       Balance Overall balance assessment: Mild deficits observed, not formally tested                                           Pertinent Vitals/Pain Pain Assessment Pain Assessment: 0-10 Pain Score: 5  Pain Location: sternal incision Pain Descriptors / Indicators: Sore Pain Intervention(s): Monitored during session    Home Living Family/patient expects to be discharged to:: Private residence Living Arrangements: Alone Available Help at Discharge: Family;Available PRN/intermittently Type of Home: House Home Access: Stairs to enter Entrance Stairs-Rails: None Entrance Stairs-Number of Steps: 1   Home Layout: One level Home Equipment: Agricultural consultant (2 wheels);Rollator (4 wheels);BSC/3in1;Shower seat (sleep number bed)      Prior Function Prior Level of Function : Independent/Modified Independent;Working/employed;Driving             Mobility Comments: indep without AD ADLs Comments:  indep     Extremity/Trunk Assessment   Upper Extremity Assessment Upper Extremity Assessment: Overall WFL for tasks assessed    Lower Extremity Assessment Lower Extremity Assessment: Overall WFL for tasks assessed    Cervical / Trunk Assessment Cervical / Trunk Assessment: Other exceptions Cervical / Trunk Exceptions: sternal incision  Communication   Communication Communication: No apparent difficulties    Cognition Arousal:  Alert Behavior During Therapy: WFL for tasks assessed/performed (tangential)   PT - Cognitive impairments: No apparent impairments                       PT - Cognition Comments: requires re-direction Following commands: Intact       Cueing Cueing Techniques: Verbal cues     General Comments General comments (skin integrity, edema, etc.): VSS, sternal incision dressing intact    Exercises     Assessment/Plan    PT Assessment Patient needs continued PT services  PT Problem List Decreased strength;Decreased activity tolerance;Decreased balance;Decreased mobility       PT Treatment Interventions DME instruction;Gait training;Stair training;Functional mobility training;Therapeutic activities;Therapeutic exercise;Balance training    PT Goals (Current goals can be found in the Care Plan section)  Acute Rehab PT Goals Patient Stated Goal: home PT Goal Formulation: With patient Time For Goal Achievement: 10/04/23 Potential to Achieve Goals: Good    Frequency Min 1X/week     Co-evaluation               AM-PAC PT "6 Clicks" Mobility  Outcome Measure Help needed turning from your back to your side while in a flat bed without using bedrails?: A Little Help needed moving from lying on your back to sitting on the side of a flat bed without using bedrails?: A Little Help needed moving to and from a bed to a chair (including a wheelchair)?: A Little Help needed standing up from a chair using your arms (e.g., wheelchair or bedside chair)?: A Little Help needed to walk in hospital room?: A Little Help needed climbing 3-5 steps with a railing? : A Little 6 Click Score: 18    End of Session   Activity Tolerance: Patient tolerated treatment well Patient left: in chair;with call bell/phone within reach;with nursing/sitter in room Nurse Communication: Mobility status PT Visit Diagnosis: Unsteadiness on feet (R26.81)    Time: 1610-9604 PT Time Calculation (min)  (ACUTE ONLY): 31 min   Charges:   PT Evaluation $PT Eval Low Complexity: 1 Low PT Treatments $Gait Training: 8-22 mins PT General Charges $$ ACUTE PT VISIT: 1 Visit         Lewis Shock, PT, DPT Acute Rehabilitation Services Secure chat preferred Office #: [phone]   Iona Hansen 09/20/2023, 2:23 PM

## 2023-09-20 NOTE — Anesthesia Postprocedure Evaluation (Addendum)
 Anesthesia Post Note  Patient: [name]  Procedure(s) Performed: CORONARY ARTERY BYPASS GRAFTING (CABG) TIMES TWO USING LEFT INTERNAL MAMMARY ARTERY AND ENDOSCOPICALLY HARVESTED RIGHT GREATER SAPHENOUS VEIN (Chest) TRANSESOPHAGEAL ECHOCARDIOGRAM (TEE) (Chest)     Patient location during evaluation: SICU Anesthesia Type: General Level of consciousness: sedated Pain management: pain level controlled Vital Signs Assessment: post-procedure vital signs reviewed and stable Respiratory status: patient remains intubated per anesthesia plan Cardiovascular status: stable Postop Assessment: no apparent nausea or vomiting Anesthetic complications: no   No notable events documented.  Last Vitals:    Last Pain:                 Collene Schlichter

## 2023-09-20 NOTE — Progress Notes (Signed)
      [address]       [place] [address]             [phone]      POD # 1 CABG x 2  No complaints  BP (!) 130/56   Pulse (!) 103   Temp (!) 100.6 F (38.1 C)   Resp (!) 22   Ht 5\' 8"  (1.727 m)   Wt 103.5 kg   SpO2 (!) 88%   BMI 34.69 kg/m   Intake/Output Summary (Last 24 hours) at 09/20/2023 1916 Last data filed at 09/20/2023 1915 Gross per 24 hour  Intake 1063.59 ml  Output 3335 ml  Net -2271.41 ml   K 4.0, creatinine 0.98 Hct 34  Doing well POD # 1  Nayvie Lips C. Dorris Fetch, MD [facility] [phone]

## 2023-09-20 NOTE — Evaluation (Signed)
 Occupational Therapy Evaluation Patient Details Name: [name] MRN: [id number] DOB: 07-15-1961 Today's Date: 09/20/2023   History of Present Illness   Pt is a 63 year old gentleman who underwent CABGx2 on 3/3. PMH: type 2 diabetes, hyperlipidemia, hypertension, previous smoking until December 2024, stroke and CAD who suffered an anterior STEMI in April 2021 treated with PCI/DES to the LAD.     Clinical Impressions PTA, pt lived alone and was independent in ADL, IADL, and working. Upon eval, pt with decreased activity tolerance and knowledge of precautions. Performing ADL with mod I-min A at time of eval. Pt reports ex fiance can intermittently assist him as needed. Will continue to follow acutely, but do not suspect need for OT follow up after discharge.      If plan is discharge home, recommend the following:   Assistance with cooking/housework;Assist for transportation;Help with stairs or ramp for entrance     Functional Status Assessment   Patient has had a recent decline in their functional status and demonstrates the ability to make significant improvements in function in a reasonable and predictable amount of time.     Equipment Recommendations   None recommended by OT     Recommendations for Other Services         Precautions/Restrictions   Precautions Precautions: Sternal Precaution Booklet Issued: Yes (comment) Recall of Precautions/Restrictions: Intact Precaution/Restrictions Comments: educated on sternal precautions, pt with good verbal understanding and adherence; education provided for adherence during ADL Restrictions Weight Bearing Restrictions Per Provider Order: Yes RUE Weight Bearing Per Provider Order: Non weight bearing LUE Weight Bearing Per Provider Order: Non weight bearing Other Position/Activity Restrictions: sternal precautions     Mobility Bed Mobility Overal bed mobility: Needs Assistance Bed Mobility: Supine to Sit      Supine to sit: Contact guard, HOB elevated     General bed mobility comments: held onto heart pillow, brought legs over edge of bed and used abdominal muscles to elevate trunk from [facility] elevation    Transfers Overall transfer level: Needs assistance Equipment used: None Transfers: Sit to/from Stand Sit to Stand: Contact guard assist           General transfer comment: contact guard for safety and line management, held onto heart pillow and used forward walking momentum. increased time to scoot toward EOB prior to stand      Balance Overall balance assessment: Mild deficits observed, not formally tested                                         ADL either performed or assessed with clinical judgement   ADL Overall ADL's : Needs assistance/impaired Eating/Feeding: Modified independent;Bed level;Sitting   Grooming: Supervision/safety;Standing   Upper Body Bathing: Set up;Sitting   Lower Body Bathing: Contact guard assist;Sit to/from stand   Upper Body Dressing : Minimal assistance;Sitting   Lower Body Dressing: Contact guard assist;Sit to/from stand   Toilet Transfer: Contact guard assist           Functional mobility during ADLs: Contact guard assist       Vision Baseline Vision/History: 1 Wears glasses Ability to See in Adequate Light: 0 Adequate Patient Visual Report: No change from baseline       Perception Perception: Not tested       Praxis Praxis: Not tested       Pertinent Vitals/Pain Pain Assessment Pain Assessment: Faces Faces Pain Scale:  Hurts a little bit Pain Location: sternal incision Pain Descriptors / Indicators: Sore Pain Intervention(s): Limited activity within patient's tolerance, Monitored during session     Extremity/Trunk Assessment Upper Extremity Assessment Upper Extremity Assessment: Generalized weakness;Left hand dominant (educated to move in the tube)   Lower Extremity Assessment Lower Extremity  Assessment: Defer to PT evaluation   Cervical / Trunk Assessment Cervical / Trunk Assessment: Other exceptions Cervical / Trunk Exceptions: sternal incision   Communication Communication Communication: No apparent difficulties   Cognition Arousal: Alert Behavior During Therapy: WFL for tasks assessed/performed (tangential) Cognition: No apparent impairments             OT - Cognition Comments: follows all commands WFL. initially needed reminder for sternal precautions but then able to recall with increased time.                 Following commands: Intact       Cueing  General Comments   Cueing Techniques: Verbal cues  VSS   Exercises     Shoulder Instructions      Home Living Family/patient expects to be discharged to:: Private residence Living Arrangements: Alone Available Help at Discharge: Family;Available PRN/intermittently Type of Home: House Home Access: Stairs to enter Entrance Stairs-Number of Steps: 1 Entrance Stairs-Rails: None Home Layout: One level     Bathroom Shower/Tub: Producer, television/film/video: Standard Bathroom Accessibility: Yes   Home Equipment: Agricultural consultant (2 wheels);Rollator (4 wheels);BSC/3in1;Shower seat (sleep number bed)          Prior Functioning/Environment Prior Level of Function : Independent/Modified Independent;Working/employed;Driving             Mobility Comments: indep without AD ADLs Comments: indep    OT Problem List: Decreased strength;Decreased activity tolerance;Impaired balance (sitting and/or standing);Cardiopulmonary status limiting activity;Decreased knowledge of precautions   OT Treatment/Interventions: Self-care/ADL training;Therapeutic exercise;DME and/or AE instruction;Balance training;Patient/family education;Therapeutic activities      OT Goals(Current goals can be found in the care plan section)   Acute Rehab OT Goals Patient Stated Goal: get better OT Goal Formulation:  With patient Time For Goal Achievement: 10/04/23 Potential to Achieve Goals: Good   OT Frequency:  Min 1X/week    Co-evaluation              AM-PAC OT "6 Clicks" Daily Activity     Outcome Measure Help from another person eating meals?: None Help from another person taking care of personal grooming?: A Little Help from another person toileting, which includes using toliet, bedpan, or urinal?: A Little Help from another person bathing (including washing, rinsing, drying)?: A Little Help from another person to put on and taking off regular upper body clothing?: A Little Help from another person to put on and taking off regular lower body clothing?: A Little 6 Click Score: 19   End of Session Equipment Utilized During Treatment: Other (comment) (heart pillow) Nurse Communication: Mobility status  Activity Tolerance: Patient tolerated treatment well Patient left: in bed;with call bell/phone within reach  OT Visit Diagnosis: Unsteadiness on feet (R26.81);Other (comment);Muscle weakness (generalized) (M62.81) (decr activity tolerance)                Time: 1610-9604 OT Time Calculation (min): 27 min Charges:  OT General Charges $OT Visit: 1 Visit OT Evaluation $OT Eval Moderate Complexity: 1 Mod OT Treatments $Self Care/Home Management : 8-22 mins  Tyler Deis, OTR/L [facility] Acute Rehabilitation Office: [phone]   Myrla Halsted 09/20/2023, 4:53 PM

## 2023-09-21 ENCOUNTER — Inpatient Hospital Stay (HOSPITAL_COMMUNITY)

## 2023-09-21 LAB — BASIC METABOLIC PANEL
Anion gap: 10 (ref 5–15)
BUN: 11 mg/dL (ref 6–20)
CO2: 25 mmol/L (ref 22–32)
Calcium: 8.3 mg/dL — ABNORMAL LOW (ref 8.9–10.3)
Chloride: 103 mmol/L (ref 98–111)
Creatinine, Ser: 1.03 mg/dL (ref 0.61–1.24)
GFR, Estimated: >60 mL/min (ref >60)
Glucose, Bld: 126 mg/dL — ABNORMAL HIGH (ref 70–99)
Potassium: 4 mmol/L (ref 3.5–5.1)
Sodium: 138 mmol/L (ref 135–145)

## 2023-09-21 LAB — CBC
HCT: 34.9 % — ABNORMAL LOW (ref 39.0–52.0)
Hemoglobin: 11.7 g/dL — ABNORMAL LOW (ref 13.0–17.0)
MCH: 32.4 pg (ref 26.0–34.0)
MCHC: 33.5 g/dL (ref 30.0–36.0)
MCV: 96.7 fL (ref 80.0–100.0)
Platelets: 141 10*3/uL — ABNORMAL LOW (ref 150–400)
RBC: 3.61 MIL/uL — ABNORMAL LOW (ref 4.22–5.81)
RDW: 12.3 % (ref 11.5–15.5)
WBC: 9 10*3/uL (ref 4.0–10.5)
nRBC: 0 % (ref 0.0–0.2)

## 2023-09-21 LAB — GLUCOSE, CAPILLARY
Glucose-Capillary: 111 mg/dL — ABNORMAL HIGH (ref 70–99)
Glucose-Capillary: 122 mg/dL — ABNORMAL HIGH (ref 70–99)
Glucose-Capillary: 132 mg/dL — ABNORMAL HIGH (ref 70–99)
Glucose-Capillary: 139 mg/dL — ABNORMAL HIGH (ref 70–99)
Glucose-Capillary: 187 mg/dL — ABNORMAL HIGH (ref 70–99)
Glucose-Capillary: 98 mg/dL (ref 70–99)

## 2023-09-21 MED ORDER — SODIUM CHLORIDE 0.9% FLUSH
3.0000 mL | Freq: Two times a day (BID) | INTRAVENOUS | Status: DC
  Administered 2023-09-21 – 2023-09-22 (×4): 3 mL via INTRAVENOUS

## 2023-09-21 MED ORDER — SODIUM CHLORIDE 0.9 % IV SOLN: 250.0000 mL | INTRAVENOUS | Status: AC | PRN

## 2023-09-21 MED ORDER — ~~LOC~~ CARDIAC SURGERY, PATIENT & FAMILY EDUCATION
Freq: Once | Status: AC
  Administered 2023-09-21: 1

## 2023-09-21 MED ORDER — LACTULOSE 10 GM/15ML PO SOLN
20.0000 g | Freq: Every day | ORAL | Status: DC | PRN
Start: 2023-09-21 — End: 2023-09-23

## 2023-09-21 MED ORDER — INSULIN ASPART 100 UNIT/ML IJ SOLN
0.0000 [IU] | Freq: Three times a day (TID) | INTRAMUSCULAR | Status: DC
  Administered 2023-09-21 – 2023-09-22 (×5): 2 [IU] via SUBCUTANEOUS
  Administered 2023-09-23: 4 [IU] via SUBCUTANEOUS

## 2023-09-21 MED ORDER — SODIUM CHLORIDE 0.9% FLUSH
3.0000 mL | INTRAVENOUS | Status: DC | PRN
  Administered 2023-09-21: 3 mL via INTRAVENOUS

## 2023-09-21 MED ORDER — ASPIRIN 325 MG PO TBEC
325.0000 mg | DELAYED_RELEASE_TABLET | Freq: Every day | ORAL | Status: DC
  Administered 2023-09-21 – 2023-09-23 (×3): 325 mg via ORAL
  Filled 2023-09-21 (×3): qty 1

## 2023-09-21 MED ORDER — ENSURE ENLIVE PO LIQD
237.0000 mL | Freq: Two times a day (BID) | ORAL | Status: DC
  Administered 2023-09-22: 237 mL via ORAL

## 2023-09-21 MED FILL — Thrombin (Recombinant) For Soln 20000 Unit: CUTANEOUS | Qty: 1 | Status: AC

## 2023-09-21 NOTE — Plan of Care (Signed)

## 2023-09-21 NOTE — Progress Notes (Signed)
 2 Days Post-Op Procedure(s) (LRB): CORONARY ARTERY BYPASS GRAFTING (CABG) TIMES TWO USING LEFT INTERNAL MAMMARY ARTERY AND ENDOSCOPICALLY HARVESTED RIGHT GREATER SAPHENOUS VEIN (N/A) TRANSESOPHAGEAL ECHOCARDIOGRAM (TEE) (N/A) Subjective:  Sleepy this am. Says he had a bad night with heart pounding.  Rhythm on monitor shows NSR.  Objective: Vital signs in last 24 hours: Temp:  [99.6 F (37.6 C)-101.3 F (38.5 C)] 99.7 F (37.6 C) (03/05 0415) Pulse Rate:  [88-115] 101 (03/05 0700) Cardiac Rhythm: Sinus tachycardia;Normal sinus rhythm (03/04 2000) Resp:  [10-28] 15 (03/05 0700) BP: (92-146)/(44-87) 104/61 (03/05 0700) SpO2:  [76 %-100 %] 93 % (03/05 0700) Arterial Line BP: (120-232)/(41-89) 232/89 (03/04 1245) Weight:  [100 kg] 100 kg (03/05 0420)  Hemodynamic parameters for last 24 hours: PAP: (19-26)/(7-17) 26/17 CO:  [7.3 L/min] 7.3 L/min CI:  [3.4 L/min/m2] 3.4 L/min/m2  Intake/Output from previous day: 03/04 0701 - 03/05 0700 In: 362.4 [P.O.:120; I.V.:46.2; IV Piggyback:196.2] Out: 3155 [Urine:3075; Chest Tube:80] Intake/Output this shift: No intake/output data recorded.  General appearance: alert and cooperative Neurologic: intact Heart: regular rate and rhythm Lungs: clear to auscultation bilaterally Extremities: no edema Wound: dressings dry  Lab Results: Recent Labs    09/20/23 1602 09/21/23 0424  WBC 8.4 9.0  HGB 11.4* 11.7*  HCT 34.3* 34.9*  PLT 139* 141*   BMET:  Recent Labs    09/20/23 1602 09/21/23 0424  NA 132* 138  K 4.0 4.0  CL 99 103  CO2 22 25  GLUCOSE 171* 126*  BUN 8 11  CREATININE 0.98 1.03  CALCIUM 7.8* 8.3*    PT/INR:  Recent Labs    09/19/23 1220  LABPROT 18.1*  INR 1.5*   ABG    Component Value Date/Time   PHART 7.332 (L) 09/19/2023 1624   HCO3 24.1 09/19/2023 1624   TCO2 25 09/19/2023 1624   ACIDBASEDEF 2.0 09/19/2023 1624   O2SAT 98 09/19/2023 1624   CBG (last 3)  Recent Labs    09/20/23 2007 09/20/23 2359  09/21/23 0341  GLUCAP 124* 111* 98   CXR: mild left base atelectasis   Assessment/Plan: S/P Procedure(s) (LRB): CORONARY ARTERY BYPASS GRAFTING (CABG) TIMES TWO USING LEFT INTERNAL MAMMARY ARTERY AND ENDOSCOPICALLY HARVESTED RIGHT GREATER SAPHENOUS VEIN (N/A) TRANSESOPHAGEAL ECHOCARDIOGRAM (TEE) (N/A)  POD 2  Hemodynamically stable in sinus rhythm. Continue Lopressor.  Wt back to baseline.  DC sleeve.  Glucose under good control on current insulin regimen. Will resume his previous regimen at discharge.  DC Seroquel at hs. He says he was not taking it at home because it made him sleepy. He did receive it last night.  Transfer to 4E and continue IS, ambulation.   LOS: 2 days    Alleen Borne 09/21/2023

## 2023-09-21 NOTE — Discharge Summary (Signed)
 [address]       [place] [address]             [phone]    Physician Discharge Summary  Patient ID: [name] MRN: [id number] DOB/AGE: 1961/05/12 63 y.o.  Admit date: 09/19/2023 Discharge date: 09/23/2023  Admission Diagnoses:  Patient Active Problem List   Diagnosis Date Noted   Coronary artery disease 09/19/2023   Annual physical exam 05/20/2023   Screen for colon cancer 05/20/2023   History of vitamin D deficiency 05/20/2023   Acute reaction to situational stress 05/20/2023   Psychophysiological insomnia 03/10/2022   Hypertension associated with diabetes (HCC) 11/25/2021   Hyperlipidemia associated with type 2 diabetes mellitus (HCC) 11/25/2021   Diabetes mellitus (HCC) 11/25/2021   History of stroke 10/21/2020   Chronic systolic congestive heart failure (HCC) 11/30/2019   Coronary artery disease involving native coronary artery 11/30/2019   History of ST elevation myocardial infarction (STEMI) 10/27/2019   Psoriatic arthritis (HCC) 08/25/2017   History of colon cancer 01/02/2010   Esophageal reflux 11/04/2009     Discharge Diagnoses:  Patient Active Problem List   Diagnosis Date Noted   S/P CABG x 2 09/22/2023   Coronary artery disease 09/19/2023   Annual physical exam 05/20/2023   Screen for colon cancer 05/20/2023   History of vitamin D deficiency 05/20/2023   Acute reaction to situational stress 05/20/2023   Psychophysiological insomnia 03/10/2022   Hypertension associated with diabetes (HCC) 11/25/2021   Hyperlipidemia associated with type 2 diabetes mellitus (HCC) 11/25/2021   Diabetes mellitus (HCC) 11/25/2021   History of stroke 10/21/2020   Chronic systolic congestive heart failure (HCC) 11/30/2019   Coronary artery disease involving native coronary artery 11/30/2019   History of ST elevation myocardial infarction (STEMI) 10/27/2019   Psoriatic arthritis (HCC) 08/25/2017   History of colon cancer 01/02/2010   Esophageal  reflux 11/04/2009     Discharged Condition: stable  HPI: This is a 63 year old gentleman with a history of type 2 diabetes, hyperlipidemia, hypertension, previous smoking until December 2024, stroke and coronary artery disease who suffered an anterior STEMI in April 2021 treated with PCI/DES to the LAD.  His LVEF at the time of his MI was 35 to 40%.  Follow-up echo in April 2022 showed normalization of his ejection fraction to 55 to 60%.  He had a nuclear stress test on 07/01/2023 that was abnormal showing a large severe fixed defect involving the mid anterior septal, mid inferoseptal, apical anterior, apical septal, and apical segments consistent with infarct in the LAD territory.  No significant ischemia was identified.  Left ventricular ejection fraction was 51% and was felt to be an intermediate risk study.  He subsequent underwent cardiac catheterization on 08/12/2023 showing significant in-stent restenosis in the LAD up to 80% with the LAD disease extending proximally and distally to the stent.  There is also a large first diagonal branch that was jailed by the stent and had 95% ostial stenosis.  The left circumflex and RCA had no significant disease.  Right heart filling pressures were normal.  Pulm artery pressure and cardiac output were normal.   He reports exertional fatigue and tiredness and has to take breaks when he is exerting himself.  He denies any shortness of breath.  He has had no chest discomfort and no pain in his neck, jaw, or arms.  He denies dizziness and syncope.  Has had no peripheral edema.  He reports a knocking sensation from his  heartbeat at night and sometimes when he is exerting himself.  Dr. Laneta Simmers discussed the need for coronary artery bypass grafting surgery. Potential risks, benefits, and complications of the surgery were discussed with the patient and he agreed to proceed with surgery. Pre operative carotid duplex US showed no significant internal carotid artery  stenosis bilaterally.  Hospital Course: Patient underwent a CABG x 2 on 03/03. He was transported from the OR to [facility] ICU in stable condition. He was extubated the afternoon of surgery without complication. Drips were weaned as hemodynamics tolerated. Theone Murdoch catheter, arterial line, epicardial pacing wires and chest tubes were removed on POD1 without complication. He was started on Lopressor. CBGs were controlled on IV Insulin, he was transitioned to semglee and SSI. He was felt stable for transfer to the progressive unit. He developed sinus tachycardia, Lopressor was titrated. His bowels began moving. He was ambulating well and his incisions were healing well without sign of infection. He was felt stable for discharge.   Consults: None  Significant Diagnostic Studies:  RIGHT/LEFT HEART CATH AND CORONARY ANGIOGRAPHY     Mid RCA lesion is 40% stenosed.   Prox LAD lesion is 60% stenosed.   Prox LAD to Mid LAD lesion is 80% stenosed.   1st Diag-2 lesion is 40% stenosed.   Mid LAD lesion is 50% stenosed.   1st Diag-1 lesion is 95% stenosed.   1.  Significant in-stent restenosis in the LAD.  In addition, the LAD disease extends outside the stent proximally and distally.  In addition, the large first diagonal which was jailed by the stent has 95% ostial stenosis. 2.  Left ventricular angiography was not performed.  EF was mildly reduced by echo. 3.  Right heart catheterization showed normal filling pressures, normal pulmonary pressure and normal cardiac output.   ECHOCARDIOGRAM REPORT    Patient Name:   [name] Date of Exam: 08/04/2023 Medical Rec #:  [id number]      Height:       68.0 in Accession #:    [id number]     Weight:       225.4 lb Date of Birth:  [birth year]      BSA:          2.150 m Patient Age:    62 years       BP:           111/57 mmHg Patient Gender: M              HR:           100 bpm. Exam Location:  [place]  Procedure: 2D Echo, Cardiac Doppler, Color Doppler and  Intracardiac            Opacification Agent  Indications:    I50.33 Acute on chronic diastolic (congestive) heart failure   History:        Patient has prior history of Echocardiogram examinations. CHF,                 CAD and Previous Myocardial Infarction, Pacemaker, TIA and                 Stroke, Signs/Symptoms:Chest Pain; Risk Factors:Hypertension and                 Dyslipidemia.   Sonographer:    Ilda Mori MHA, BS, RDCS Referring Phys: 55 [facility] A ARIDA    Sonographer Comments: Technically difficult study due to poor echo windows and patient is obese. Image acquisition challenging  due to patient body habitus. IMPRESSIONS    1. Left ventricular ejection fraction, by estimation, is 40 to 45%. The left ventricle has mildly decreased function. The left ventricle demonstrates regional wall motion abnormalities (severe hypokinesis of the mid to distal anterior and anteroseptal wall with thinning of the myocardium). Left ventricular diastolic parameters are consistent with Grade I diastolic dysfunction (impaired relaxation).  2. Right ventricular systolic function is normal. The right ventricular size is normal.  3. The mitral valve is normal in structure. No evidence of mitral valve regurgitation. No evidence of mitral stenosis.  4. The aortic valve has an indeterminant number of cusps. Aortic valve regurgitation is not visualized. No aortic stenosis is present.  5. The inferior vena cava is normal in size with greater than 50% respiratory variability, suggesting right atrial pressure of 3 mmHg.  FINDINGS  Left Ventricle: Left ventricular ejection fraction, by estimation, is 40 to 45%. The left ventricle has mildly decreased function. The left ventricle demonstrates regional wall motion abnormalities. The left ventricular internal cavity size was normal in size. There is no left ventricular hypertrophy. Left ventricular diastolic parameters are consistent with  Grade I diastolic dysfunction (impaired relaxation).  Right Ventricle: The right ventricular size is normal. No increase in right ventricular wall thickness. Right ventricular systolic function is normal.  Left Atrium: Left atrial size was normal in size.  Right Atrium: Right atrial size was normal in size.  Pericardium: There is no evidence of pericardial effusion.  Mitral Valve: The mitral valve is normal in structure. No evidence of mitral valve regurgitation. No evidence of mitral valve stenosis.  Tricuspid Valve: The tricuspid valve is normal in structure. Tricuspid valve regurgitation is not demonstrated. No evidence of tricuspid stenosis.  Aortic Valve: The aortic valve has an indeterminant number of cusps. Aortic valve regurgitation is not visualized. No aortic stenosis is present. Aortic valve mean gradient measures 3.0 mmHg. Aortic valve peak gradient measures 6.4 mmHg.  Pulmonic Valve: The pulmonic valve was normal in structure. Pulmonic valve regurgitation is not visualized. No evidence of pulmonic stenosis.  Aorta: The aortic root is normal in size and structure.  Venous: The inferior vena cava is normal in size with greater than 50% respiratory variability, suggesting right atrial pressure of 3 mmHg.  IAS/Shunts: No atrial level shunt detected by color flow Doppler.    LEFT VENTRICLE PLAX 2D LVIDd:         4.80 cm Diastology LVIDs:         3.20 cm LV e' medial:    7.83 cm/s LV PW:         0.90 cm LV E/e' medial:  6.4 LV IVS:        0.90 cm LV e' lateral:   7.62 cm/s                        LV E/e' lateral: 6.6    LEFT ATRIUM           Index        RIGHT ATRIUM           Index LA diam:      4.20 cm 1.95 cm/m   RA Area:     16.90 cm LA Vol (A4C): 62.1 ml 28.88 ml/m  RA Volume:   38.10 ml  17.72 ml/m  AORTIC VALVE AV Vmax:           126.00 cm/s AV Vmean:  85.300 cm/s AV VTI:            0.200 m AV Peak Grad:      6.4 mmHg AV Mean Grad:       3.0 mmHg LVOT Vmax:         104.00 cm/s LVOT Vmean:        70.700 cm/s LVOT VTI:          0.168 m LVOT/AV VTI ratio: 0.84   AORTA Ao Root diam: 3.40 cm Ao Asc diam:  3.50 cm  MITRAL VALVE MV Area (PHT): 5.13 cm    SHUNTS MV Decel Time: 148 msec    Systemic VTI: 0.17 m MV E velocity: 50.30 cm/s MV A velocity: 66.60 cm/s MV E/A ratio:  0.76  Julien Nordmann MD Electronically signed by Julien Nordmann MD Signature Date/Time: 08/04/2023/5:51:32 PM     Final       *INTRAOPERATIVE TRANSESOPHAGEAL REPORT *      Patient Name:   KHRIZ LIDDY Date of Exam: 09/19/2023  Medical Rec #:  [id number]      Height:       68.0 in  Accession #:    [id number]     Weight:       220.9 lb  Date of Birth:  [birth year]      BSA:          2.13 m  Patient Age:    62 years       BP:           138/75 mmHg  Patient Gender: M              HR:           82 bpm.  Exam Location:  Anesthesiology   Transesophogeal exam was perform intraoperatively during surgical  procedure.  Patient was closely monitored under general anesthesia during the entirety  of  examination.   Indications:    CAD Native Vessel i25.10  Sonographer:     Irving Burton Senior RDCS  Performing Phys: [id number] Alleen Borne  Diagnosing Phys: Arrie Aran MD   Complications: No known complications during this procedure.  POST-OP IMPRESSIONS  _ Left Ventricle: has mildly reduced systolic function, with an ejection  fraction of 45%. The cavity size was normal. The anterior and  anterolateral  segments are hypokinetic.  _ Right Ventricle: The right ventricle appears unchanged from pre-bypass  normal  function.  _ Aorta: The aorta appears unchanged from pre-bypass.  _ Left Atrial Appendage: The left atrial appendage appears unchanged from  pre-bypass.  _ Aortic Valve: The aortic valve appears unchanged from pre-bypass.  _ Mitral Valve: The mitral valve appears unchanged from pre-bypass. There  is  trivial regurgitation.  _ Tricuspid Valve:  The tricuspid valve appears unchanged from pre-bypass.  _ Pulmonic Valve: The pulmonic valve appears unchanged from pre-bypass.  _ Interatrial Septum: The interatrial septum appears unchanged from  pre-bypass.  _ Pericardium: The pericardium appears unchanged from pre-bypass.  _ Comments: Post-bypass images reviewed with surgeon.   PRE-OP FINDINGS   Left Ventricle: The left ventricle has mild-moderately reduced systolic  function, with an ejection fraction of 40-45%. The cavity size was normal.     LV Wall Scoring:  The mid anteroseptal segment and mid anterior segment are hypokinetic.     Right Ventricle: The right ventricle has normal systolic function. The  cavity was normal. There is no increase in right ventricular wall  thickness.   Left Atrium: Left atrial size was  normal in size. No left atrial/left  atrial appendage thrombus was detected. Left atrial appendage velocity is  normal at greater than 40 cm/s.   Right Atrium: Right atrial size was normal in size.   Interatrial Septum: No atrial level shunt detected by color flow Doppler.   Pericardium: There is no evidence of pericardial effusion.   Mitral Valve: The mitral valve is normal in structure. Mitral valve  regurgitation is trivial by color flow Doppler.   Tricuspid Valve: The tricuspid valve was normal in structure. Tricuspid  valve regurgitation was not visualized by color flow Doppler.   Aortic Valve: The aortic valve is tricuspid Aortic valve regurgitation was  not visualized by color flow Doppler. There is no stenosis of the aortic  valve.    Pulmonic Valve: The pulmonic valve was normal in structure.  Pulmonic valve regurgitation is not visualized by color flow Doppler.    Aorta: The aortic root, ascending aorta and aortic arch are normal in size  and structure.   Pulmonary Artery: The pulmonary artery is of normal size.     Arrie Aran MD  Electronically signed by Arrie Aran MD  Signature  Date/Time: 09/20/2023/6:40:38 PM      Final     Treatments: surgery: 09/19/2023   Surgeon:  Alleen Borne, MD   First Assistant: Doree Fudge,  PA-C:   An experienced assistant was required given the complexity of this surgery and the standard of surgical care. The assistant was needed for endoscopic vein harvest, exposure, dissection, suctioning, retraction of delicate tissues and sutures, instrument exchange and for overall help during this procedure.     Preoperative Diagnosis:  Severe single-vessel coronary artery disease   Postoperative Diagnosis:  Same   Procedure: Median Sternotomy Extracorporeal circulation 3.   Coronary artery bypass grafting x 2 Left internal mammary artery graft to the LAD SVG to diagonal 4.   Endoscopic vein harvest from the right leg  Discharge Exam: Blood pressure 131/88, pulse (!) 106, temperature 98.3 F (36.8 C), temperature source Oral, resp. rate 20, height 5\' 8"  (1.727 m), weight 98.5 kg, SpO2 93%. General appearance: alert, cooperative, and no distress Neurologic: intact Heart: NSR-ST, some PVCs, no murmur Lungs: clear to auscultation bilaterally Abdomen: soft, non-tender; bowel sounds normal; no masses,  no organomegaly Extremities: extremities normal, atraumatic, no cyanosis or edema and psoriasis patches Wound: Clean and dry without sign of infection   Discharge Medications:  The patient has been discharged on:   1.Beta Blocker:  Yes [ X  ]                              No   [   ]                              If No, reason:  2.Ace Inhibitor/ARB: Yes [   ]                                     No  [ X  ]                                     If No, reason: Soft BP, elevated HR with PVCs so Lopresor was titrated  instead  3.Statin:   Yes [ X  ]                  No  [   ]                  If No, reason:  4.Ecasa:  Yes  [  X ]                  No   [   ]                  If No, reason:  Patient had ACS upon admission:  No  Plavix/P2Y12 inhibitor: Yes [   ]                                      No  [  X ]     Discharge Instructions     Amb Referral to Cardiac Rehabilitation   Complete by: As directed    Diagnosis: CABG   CABG X ___: 2   After initial evaluation and assessments completed: Virtual Based Care may be provided alone or in conjunction with Phase 2 Cardiac Rehab based on patient barriers.: Yes   Intensive Cardiac Rehabilitation (ICR) [facility] location only OR Traditional Cardiac Rehabilitation (TCR) *If criteria for ICR are not met will enroll in TCR [facility] only): Yes      Allergies as of 09/23/2023       Reactions   Clopidogrel Anaphylaxis   "Weird" sensation in legs        Medication List     STOP taking these medications    Brilinta 60 MG Tabs tablet Generic drug: ticagrelor   lisinopril-hydrochlorothiazide 20-25 MG tablet Commonly known as: ZESTORETIC   metoprolol succinate 25 MG 24 hr tablet Commonly known as: Toprol XL   QUEtiapine 100 MG tablet Commonly known as: SEROquel       TAKE these medications    acetaminophen 325 MG tablet Commonly known as: Tylenol Take 2 tablets (650 mg total) by mouth every 6 (six) hours as needed.   aspirin EC 325 MG tablet Take 1 tablet (325 mg total) by mouth daily.   Basaglar KwikPen 100 UNIT/ML Inject 15 Units into the skin at bedtime. Titrate to Fasting Blood Sugar of 100- 150 mg/dL with morning blood sugar check. What changed:  when to take this reasons to take this   Contour Next Test test strip Generic drug: glucose blood 1 each by Other route 4 (four) times daily - after meals and at bedtime. Use as instructed   diazepam 2 MG tablet Commonly known as: Valium Take 1 tablet (2 mg total) by mouth daily as needed for anxiety.   FreeStyle Libre 3 Plus Sensor Misc Change sensor every 15 days.   insulin lispro 100 UNIT/ML KwikPen Commonly known as: HUMALOG Inject 10 Units into the skin 3 (three) times daily. As  directed What changed:  how much to take additional instructions   metFORMIN 750 MG 24 hr tablet Commonly known as: GLUCOPHAGE-XR Take 1 tablet (750 mg total) by mouth 2 (two) times daily after a meal. What changed: when to take this   metoprolol tartrate 50 MG tablet Commonly known as: LOPRESSOR Take 1 tablet (50 mg total) by mouth 2 (two) times daily.   oxyCODONE 5 MG immediate release tablet Commonly known as: Oxy IR/ROXICODONE Take 1 tablet (  5 mg total) by mouth every 6 (six) hours as needed for severe pain (pain score 7-10).   rosuvastatin 20 MG tablet Commonly known as: CRESTOR Take 1 tablet (20 mg total) by mouth every evening.   Semaglutide (2 MG/DOSE) 8 MG/3ML Sopn Inject 2 mg as directed once a week.   Simponi 50 MG/0.5ML Soaj Generic drug: Golimumab Inject 0.5 mLs into the skin every 30 (thirty) days.   zolpidem 10 MG tablet Commonly known as: AMBIEN Take 1 tablet (10 mg total) by mouth at bedtime as needed.        Follow-up Information     Alleen Borne, MD Follow up on 10/26/2023.   Specialty: Cardiothoracic Surgery Why: Follow up appointment and suture removal is at 12:30PM Contact information: [address] [employer] [address] [place] [address] [phone]         [facility] Follow up on 10/26/2023.   Why: To get chest xray at 11:30AM, 1 hour prior to yoour appointment Contact information: [address] [place] [address]        Carlos Levering, NP Follow up on 10/10/2023.   Specialty: Cardiology Why: Cardiology appointment is at 8:25AM Contact information: [address] [place] [address] [phone]         PCP needs Follow up.   Why: To obtain a Primary Care Physician you can call the toll free number on your insurance card and request a providers list for your area. You may also visit the insurance provider online portal for "find a provider".   Or you can contact Health  Connect.  Health Connect [phone] (for physician referral list assistance) Or you may visit [email or web address] and search "find a provider" Contact information: Health Connect [phone] (for physician referral list assistance)                Signed:  Jenny Reichmann, PA-C  09/23/2023, 8:31 AM

## 2023-09-22 DIAGNOSIS — Z951 Presence of aortocoronary bypass graft: Secondary | ICD-10-CM

## 2023-09-22 LAB — GLUCOSE, CAPILLARY
Glucose-Capillary: 105 mg/dL — ABNORMAL HIGH (ref 70–99)
Glucose-Capillary: 150 mg/dL — ABNORMAL HIGH (ref 70–99)
Glucose-Capillary: 144 mg/dL — ABNORMAL HIGH (ref 70–99)
Glucose-Capillary: 208 mg/dL — ABNORMAL HIGH (ref 70–99)

## 2023-09-22 NOTE — Plan of Care (Signed)

## 2023-09-22 NOTE — TOC Progression Note (Signed)
 Transition of Care (TOC) - Progression Note  Donn Pierini RN, BSN Transitions of Care Unit 4E- RN Case Manager See Treatment Team for direct phone #   Patient Details  Name: [name] MRN: [id number] Date of Birth: Aug 12, 1960  Transition of Care Physician [facility]) CM/SW Contact  Zenda Alpers, Lenn Sink, RN Phone Number: 09/22/2023, 11:09 AM  Clinical Narrative:    Referral noted for PCP needs,  CM spoke with pt at bedside- per pt he received a letter that his PCP was leaving the practice and he was re-assigned to a new PCP- he has not yet seen the new provider and voiced he is hesitant about staying with practice and may want to find a new practice for his PCP needs.  Pt was asking for a list- explained to pt that TOC does not have a "list" of providers- that he can contact his insurance provider or get on his online portal to search for a new provider that is in-network. Pt voiced he has tried that and finds it difficult, CM also offered [facility] website to search a provider as well as health connect- pt stated that he would just follow up himself, he plans to give the new provider a chance first and if needed then search for a new one.  Info for Health Connect left with pt.    Expected Discharge Plan: Home/Self Care Barriers to Discharge: Continued Medical Work up  Expected Discharge Plan and Services In-house Referral: NA   Post Acute Care Choice: NA Living arrangements for the past 2 months: Single Family Home                 DME Arranged: N/A DME Agency: NA       HH Arranged: NA HH Agency: NA         Social Determinants of Health (SDOH) Interventions SDOH Screenings   Food Insecurity: No Food Insecurity (09/21/2023)  Housing: Low Risk  (09/21/2023)  Transportation Needs: No Transportation Needs (09/21/2023)  Utilities: Not At Risk (09/21/2023)  Alcohol Screen: Low Risk  (05/18/2023)  Depression (PHQ2-9): Low Risk  (11/12/2022)  Financial Resource Strain: Low Risk  (05/18/2023)   Physical Activity: Sufficiently Active (05/18/2023)  Social Connections: Socially Isolated (05/18/2023)  Stress: No Stress Concern Present (05/18/2023)  Tobacco Use: Medium Risk (09/19/2023)    Readmission Risk Interventions     No data to display

## 2023-09-22 NOTE — Progress Notes (Addendum)
 [address]       [place] [address]             [phone]      3 Days Post-Op Procedure(s) (LRB): CORONARY ARTERY BYPASS GRAFTING (CABG) TIMES TWO USING LEFT INTERNAL MAMMARY ARTERY AND ENDOSCOPICALLY HARVESTED RIGHT GREATER SAPHENOUS VEIN (N/A) TRANSESOPHAGEAL ECHOCARDIOGRAM (TEE) (N/A) Subjective: No complaints this AM. Pt sitting on the side of the bed eating breakfast, states he has had 2 bowel movement this AM.   Objective: Vital signs in last 24 hours: Temp:  [97.8 F (36.6 C)-99.2 F (37.3 C)] 98.6 F (37 C) (03/06 0327) Pulse Rate:  [89-109] 90 (03/06 0327) Cardiac Rhythm: Sinus tachycardia (03/05 1916) Resp:  [13-20] 13 (03/06 0327) BP: (90-142)/(58-71) 109/69 (03/06 0327) SpO2:  [91 %-95 %] 92 % (03/06 0327) FiO2 (%):  [0 %] 0 % (03/05 0823)  Hemodynamic parameters for last 24 hours:    Intake/Output from previous day: 03/05 0701 - 03/06 0700 In: 480 [P.O.:480] Out: 500 [Urine:500] Intake/Output this shift: No intake/output data recorded.  General appearance: alert, cooperative, and no distress Neurologic: intact Heart: NSR-ST, PVCs, no murmur Lungs: clear to auscultation bilaterally Abdomen: soft, non-tender; bowel sounds normal; no masses,  no organomegaly Extremities: No edema of lower extremities bilaterally, psoriasis patches present on bilateral lower extremities Wound: Clean and dry without sign of infection  Lab Results: Recent Labs    09/20/23 1602 09/21/23 0424  WBC 8.4 9.0  HGB 11.4* 11.7*  HCT 34.3* 34.9*  PLT 139* 141*   BMET:  Recent Labs    09/20/23 1602 09/21/23 0424  NA 132* 138  K 4.0 4.0  CL 99 103  CO2 22 25  GLUCOSE 171* 126*  BUN 8 11  CREATININE 0.98 1.03  CALCIUM 7.8* 8.3*    PT/INR:  Recent Labs    09/19/23 1220  LABPROT 18.1*  INR 1.5*   ABG    Component Value Date/Time   PHART 7.332 (L) 09/19/2023 1624   HCO3 24.1 09/19/2023 1624   TCO2 25 09/19/2023 1624   ACIDBASEDEF 2.0  09/19/2023 1624   O2SAT 98 09/19/2023 1624   CBG (last 3)  Recent Labs    09/21/23 1632 09/21/23 2111 09/22/23 0618  GLUCAP 132* 187* 105*    Assessment/Plan: S/P Procedure(s) (LRB): CORONARY ARTERY BYPASS GRAFTING (CABG) TIMES TWO USING LEFT INTERNAL MAMMARY ARTERY AND ENDOSCOPICALLY HARVESTED RIGHT GREATER SAPHENOUS VEIN (N/A) TRANSESOPHAGEAL ECHOCARDIOGRAM (TEE) (N/A)  Neuro: Not taking Seroquel at home, has been d/c'd.   CV: BP controlled on Lopressor 25mg  BID. NSR-ST, some ventricular trigeminy this AM. HR 90s-110. BP restrictive of Lopressor titration.   Pulm: Saturating 92% on RA. CXR yesterday with likely bibasilar atelectasis, L>R. Encourage IS and ambulation.   GI: +BM, tolerating a diet  Endo: T2DM, preop A1C 6.1. CBGs 132/187/105, on Lantus 20U daily and SSI. Will restart home regimen at discharge.  Renal: Last Cr 1.03, stable. UO 500cc/24hrs recorded. Was at preop weight yesterday, no weight recorded today.   ID:  Tmax 99.2, no leukocytosis  Expected postop ABLA: Stable, last H/H 11.7/34.9. Not clinically significant at this time. Stable reactive thrombocytopenia.   DVT Prophylaxis: No Lovenox likely due to thrombocytopenia  Anticoagulation: Will continue ASA 325mg  only at discharge, will not restart Brilinta per Dr. Laneta Simmers   Dispo: Hopefully can d/c tomorrow AM if remains stable    LOS: 3 days    Jenny Reichmann, PA-C 09/22/2023   Chart reviewed, patient  examined, agree with above.

## 2023-09-22 NOTE — Progress Notes (Signed)
 CARDIAC REHAB PHASE I   Pt resting in bed. Per pt ambulating independently, tolerating well.  Post OHS education including site care, restrictions, heart healthy diabetic diet, sternal precautions, IS use at home, home needs at discharge, exercise guidelines and CRP2 reviewed. Pt not receptive to education, denied questions or concerns. Will refer to [facility] for CRP2. CRP1 education complete, sign off today.   [id number] Woodroe Chen, RN BSN 09/22/2023 9:19 AM

## 2023-09-22 NOTE — Plan of Care (Signed)

## 2023-09-22 NOTE — Progress Notes (Signed)
 Mobility Specialist Progress Note:    09/22/23 1020  Mobility  Activity Ambulated independently to bathroom;Ambulated independently in hallway;Ambulated independently in room  Level of Assistance Standby assist, set-up cues, supervision of patient - no hands on  Assistive Device None  Distance Ambulated (ft) 470 ft  RUE Weight Bearing Per Provider Order NWB  LUE Weight Bearing Per Provider Order NWB  Activity Response Tolerated well  Mobility Referral Yes  Mobility visit 1 Mobility  Mobility Specialist Start Time (ACUTE ONLY) 1020  Mobility Specialist Stop Time (ACUTE ONLY) 1030  Mobility Specialist Time Calculation (min) (ACUTE ONLY) 10 min   Pt received ambulating independently from bathroom. Agreeable to mobility session. Ambulated in hallway with SV. No AD required. Tolerated well, Max HR 107 bpm. Returned pt to room, left with all needs met.    Feliciana Rossetti Mobility Specialist Please contact via Special educational needs teacher or  Rehab office at [phone]

## 2023-09-23 ENCOUNTER — Other Ambulatory Visit (HOSPITAL_COMMUNITY): Payer: Self-pay

## 2023-09-23 LAB — GLUCOSE, CAPILLARY: Glucose-Capillary: 194 mg/dL — ABNORMAL HIGH (ref 70–99)

## 2023-09-23 MED ORDER — OXYCODONE HCL 5 MG PO TABS
5.0000 mg | ORAL_TABLET | Freq: Four times a day (QID) | ORAL | 0 refills | Status: DC | PRN
  Filled 2023-09-23: qty 28, 7d supply, fill #0

## 2023-09-23 MED ORDER — METOPROLOL TARTRATE 50 MG PO TABS
50.0000 mg | ORAL_TABLET | Freq: Two times a day (BID) | ORAL | Status: DC
  Administered 2023-09-23: 50 mg via ORAL
  Filled 2023-09-23: qty 1

## 2023-09-23 MED ORDER — METOPROLOL TARTRATE 50 MG PO TABS
50.0000 mg | ORAL_TABLET | Freq: Two times a day (BID) | ORAL | 1 refills | Status: DC
  Filled 2023-09-23: qty 30, 15d supply, fill #0
  Filled 2023-10-13: qty 30, 15d supply, fill #1

## 2023-09-23 MED ORDER — ASPIRIN 325 MG PO TBEC: 325.0000 mg | DELAYED_RELEASE_TABLET | Freq: Every day | ORAL | Status: DC

## 2023-09-23 MED ORDER — ACETAMINOPHEN 325 MG PO TABS: 650.0000 mg | ORAL_TABLET | Freq: Four times a day (QID) | ORAL | Status: DC | PRN

## 2023-09-23 MED FILL — Magnesium Sulfate Inj 50%: INTRAMUSCULAR | Qty: 10 | Status: AC

## 2023-09-23 MED FILL — Sodium Bicarbonate IV Soln 8.4%: INTRAVENOUS | Qty: 50 | Status: AC

## 2023-09-23 MED FILL — Sodium Chloride IV Soln 0.9%: INTRAVENOUS | Qty: 300 | Status: CN

## 2023-09-23 MED FILL — Mannitol IV Soln 20%: INTRAVENOUS | Qty: 500 | Status: AC

## 2023-09-23 MED FILL — Heparin Sodium (Porcine) Inj 1000 Unit/ML: Qty: 1000 | Status: AC

## 2023-09-23 MED FILL — Sodium Chloride IV Soln 0.9%: INTRAVENOUS | Qty: 2000 | Status: AC

## 2023-09-23 MED FILL — Potassium Chloride Inj 2 mEq/ML: INTRAVENOUS | Qty: 40 | Status: AC

## 2023-09-23 MED FILL — Heparin Sodium (Porcine) Inj 1000 Unit/ML: INTRAMUSCULAR | Qty: 10 | Status: AC

## 2023-09-23 MED FILL — Tranexamic Acid IV Soln 1000 MG/10ML (100 MG/ML): INTRAVENOUS | Qty: 3000 | Status: AC

## 2023-09-23 MED FILL — Electrolyte-R (PH 7.4) Solution: INTRAVENOUS | Qty: 3000 | Status: AC

## 2023-09-23 MED FILL — Lidocaine HCl Local Soln Prefilled Syringe 100 MG/5ML (2%): INTRAMUSCULAR | Qty: 5 | Status: AC

## 2023-09-23 NOTE — Progress Notes (Signed)
 D/C tele and IV. Went over AVS with pt and all questions were addressed.   Lawson Radar

## 2023-09-23 NOTE — Progress Notes (Signed)
 [address]       [place] [address]             [phone]      4 Days Post-Op Procedure(s) (LRB): CORONARY ARTERY BYPASS GRAFTING (CABG) TIMES TWO USING LEFT INTERNAL MAMMARY ARTERY AND ENDOSCOPICALLY HARVESTED RIGHT GREATER SAPHENOUS VEIN (N/A) TRANSESOPHAGEAL ECHOCARDIOGRAM (TEE) (N/A) Subjective: Patient states he is ready to go home. No new complaints.   Objective: Vital signs in last 24 hours: Temp:  [99 F (37.2 C)-99.6 F (37.6 C)] 99 F (37.2 C) (03/07 0449) Pulse Rate:  [90-105] 99 (03/07 0449) Cardiac Rhythm: Normal sinus rhythm (03/06 2100) Resp:  [17-20] 20 (03/07 0623) BP: (127-141)/(66-82) 136/77 (03/07 0449) SpO2:  [92 %-93 %] 92 % (03/07 0449) Weight:  [98.5 kg] 98.5 kg (03/07 0623)  Hemodynamic parameters for last 24 hours:    Intake/Output from previous day: 03/06 0701 - 03/07 0700 In: 120 [P.O.:120] Out: 150 [Urine:150] Intake/Output this shift: No intake/output data recorded.  General appearance: alert, cooperative, and no distress Neurologic: intact Heart: NSR-ST, some PVCs, no murmur Lungs: clear to auscultation bilaterally Abdomen: soft, non-tender; bowel sounds normal; no masses,  no organomegaly Extremities: extremities normal, atraumatic, no cyanosis or edema and psoriasis patches Wound: Clean and dry without sign of infection  Lab Results: Recent Labs    09/20/23 1602 09/21/23 0424  WBC 8.4 9.0  HGB 11.4* 11.7*  HCT 34.3* 34.9*  PLT 139* 141*   BMET:  Recent Labs    09/20/23 1602 09/21/23 0424  NA 132* 138  K 4.0 4.0  CL 99 103  CO2 22 25  GLUCOSE 171* 126*  BUN 8 11  CREATININE 0.98 1.03  CALCIUM 7.8* 8.3*    PT/INR: No results for input(s): "LABPROT", "INR" in the last 72 hours. ABG    Component Value Date/Time   PHART 7.332 (L) 09/19/2023 1624   HCO3 24.1 09/19/2023 1624   TCO2 25 09/19/2023 1624   ACIDBASEDEF 2.0 09/19/2023 1624   O2SAT 98 09/19/2023 1624   CBG (last 3)  Recent  Labs    09/22/23 1611 09/22/23 2127 09/23/23 0608  GLUCAP 144* 208* 194*    Assessment/Plan: S/P Procedure(s) (LRB): CORONARY ARTERY BYPASS GRAFTING (CABG) TIMES TWO USING LEFT INTERNAL MAMMARY ARTERY AND ENDOSCOPICALLY HARVESTED RIGHT GREATER SAPHENOUS VEIN (N/A) TRANSESOPHAGEAL ECHOCARDIOGRAM (TEE) (N/A)  Neuro: Not taking Seroquel at home, has been d/c'd.    CV: SBP mostly 120s-130s, up into the 140s on Lopressor 25mg  BID, titrate Lopressor to 50mg  BID. NSR-ST, some PVCs HR 90s-105.    Pulm: Saturating well on RA. Last CXR with likely bibasilar atelectasis, L>R. Encourage IS and ambulation.    GI: +BM, tolerating a diet   Endo: T2DM, preop A1C 6.1. CBGs elevated 144/208/194, on Lantus 20U daily and SSI. Will restart home regimen at discharge.   Renal: Last Cr 1.03, stable. UO 150cc/24hrs recorded. Under preop weight.   ID:  Tmax 99.6, no leukocytosis on last CBC. No labs this AM. No sign of infection.    Expected postop ABLA: Stable, last H/H 11.7/34.9. Not clinically significant at this time. Stable reactive thrombocytopenia. No new labs this AM.    DVT Prophylaxis: No Lovenox likely due to thrombocytopenia   Anticoagulation: Will continue ASA 325mg  only at discharge, will not restart Brilinta per Dr. Laneta Simmers    Dispo: Plan to d/c home this AM. Patient has RW, BSC, and shower chair at home.     LOS: 4 days  Jenny Reichmann, PA-C 09/23/2023

## 2023-09-26 ENCOUNTER — Telehealth: Payer: Self-pay

## 2023-09-26 NOTE — Transitions of Care (Post Inpatient/ED Visit) (Signed)
   09/26/2023  Name: [name] MRN: [id number] DOB: [birth year]  Today's TOC FU Call Status: Today's TOC FU Call Status:: Unsuccessful Call (1st Attempt) Unsuccessful Call (1st Attempt) Date: 09/26/23  Attempted to reach the patient regarding the most recent Inpatient/ED visit.  Follow Up Plan: Additional outreach attempts will be made to reach the patient to complete the Transitions of Care (Post Inpatient/ED visit) call.   Laderrick Wilk A. Mliss Fritz RN, BA, [facility], CRRN [facility] RN Care Manager, Transition of Care [phone]

## 2023-09-27 ENCOUNTER — Other Ambulatory Visit: Payer: Self-pay | Admitting: *Deleted

## 2023-09-29 ENCOUNTER — Encounter

## 2023-09-30 ENCOUNTER — Other Ambulatory Visit: Payer: Self-pay | Admitting: Physician Assistant

## 2023-09-30 ENCOUNTER — Telehealth: Payer: Self-pay | Admitting: *Deleted

## 2023-09-30 ENCOUNTER — Other Ambulatory Visit: Payer: Self-pay

## 2023-09-30 MED ORDER — OXYCODONE HCL 5 MG PO TABS
5.0000 mg | ORAL_TABLET | Freq: Four times a day (QID) | ORAL | 0 refills | Status: DC | PRN
Start: 2023-09-30 — End: 2023-11-10
  Filled 2023-09-30: qty 28, 7d supply, fill #0

## 2023-09-30 NOTE — Telephone Encounter (Signed)
 Patient aware of prescription refill being sent to [facility] outpatient pharmacy.

## 2023-09-30 NOTE — Telephone Encounter (Signed)
-----   Message from Ardelle Balls sent at 09/30/2023 10:29 AM EDT ----- Regarding: RE: pain meds I can try to place a refill, but pharmacy may not fill it as before 7 days. Thanks. ----- Message ----- From: Ludwig Clarks, RN Sent: 09/30/2023  10:03 AM EDT To: Ardelle Balls, PA-C Subject: pain meds                                      Donielle,  Mr. Wilkie is s/p CABG 3/3 with Bartle. D/C'ed 3/7 with Oxycodone. He is requesting a refill. States he is taking 2 maybe 3 per day. He is using Tylenol. Pain is well controlled. Still has 6 left but wanted to ask for a refill before going into the weekend. Please advise.  [id number] [place], [place] - [address]  Thanks, Gillie Crisci

## 2023-10-07 NOTE — Progress Notes (Signed)
 Cardiology Clinic Note   Date: 10/10/2023 ID: [name], DOB Feb 10, 1961, MRN [id number]  Primary Cardiologist:  Lorine Bears, MD  Chief Complaint   [name] is a 63 y.o. male who presents to the clinic today for hospital follow up.   Patient Profile   [name] is followed by Dr. Kirke Corin for the history outlined below.      Past medical history significant for: CAD. LHC 10/27/2019 (STEMI): Proximal to mid LAD 100%.  D1 #1 90%, #2 80%.  PCI with DES 2.75 x 30 mm to proximal LAD.  Severely elevated LVEDP. Nuclear stress test 07/01/2023: Abnormal, intermediate risk study.  Large in size, severe, fixed defect involving the mid anteroseptal, mid inferior septal, apical anterior, apical septal, and apical segments consistent with infarct in the LAD territory.  No significant ischemia identified. R/LHC 08/12/2023: Mid RCA 40%.  Proximal LAD 60%.  Proximal to mid LAD 80%.  Mid LAD 50%.  D1 #1 95%, #2 40%.  Significant in-stent restenosis in the LAD.  In addition LAD disease extends outside the stent proximally and distally.  D1 which was jailed by the stent has 95% ostial stenosis.  CT surgery consult recommended. CABG x 2 09/19/2023: LIMA to LAD, SVG to diagonal. Chronic HFrEF. Echo 08/04/2023: EF 40 to 45%.  Severe hypokinesis of the mid to distal anterior and anteroseptal wall with thinning of the myocardium.  Grade I DD.  Normal RV size/function.  No significant valvular abnormalities. R/LHC 08/12/2023: Normal filling pressures, normal pulmonary pressure, normal cardiac output. Hypertension. Hyperlipidemia. Lipid panel 05/20/2023: LDL 49, HDL 39, TG 130, total 111. GERD. T2DM. CVA. Loop recorder insertion on 10/21/2020. Remote device check 09/11/2023: No new symptoms, tachy, bradycardia, or pauses episodes.  No new A-fib episodes. Tobacco abuse.  In summary, patient was admitted in April 2021 with STEMI.  He underwent emergent PCI with DES to proximal LAD.  Echo at that time  showed EF 35 to 40% with severe hypokinesis of the entire anteroseptal and anterior wall.  He was intolerant to Brilinta and was transitioned to Plavix.  In April 2022 he suffered an acute CVA in the setting of noncompliance with antihypertensive medications.  Imaging demonstrated a left posterior thalamic punctate infarct.  TEE showed no evidence of cardiac source of embolization with an EF of 55 to 60%.  He underwent loop implantation.  He was seen in follow-up in December 2020 for with reports of worsening DOE and episodes of palpitations and chest discomfort particularly at night.  Symptoms described were anginal equivalent.  He had been under an increased amount of stress and had began smoking again.  Nuclear stress testing was abnormal/intermediate risk as detailed above.  Echo in January 2025 showed EF 40 to 45% as detailed above.  Patient was last seen in the office by [name], PA-C on 08/09/2023 for follow-up after testing.  He reported improvement in palpitations and chest discomfort.  He verbalized concern for being adherent to medications and did not want to pursue escalation of antianginal therapy.  He opted to undergo LHC to evaluate progressive cardiomyopathy and chest discomfort. LHC was performed on 1/24 and demonstrated significant in-stent restenosis in the LAD as detailed above with recommendation for CT surgery consult. Patient underwent CABG x 2 on 3/3.      History of Present Illness    Today, patient reports continued sensation of heart pounding. This is similar to the sensation he had in 2021 that resolved with stent placement. This began again in  late 2024. Loop recorder has not showed any arrhythmias. He is frustrated that symptoms have not resolved with bypass. He denied chest pain, shortness of breath or lower extremity edema. He has sternal discomfort at the site of incision that resolves with pain medication. He has been increasing his physical activity. He is under a lot of  stress with issues with his short term leave at work. He is not interested in cardiac rehab.    ROS: All other systems reviewed and are otherwise negative except as noted in History of Present Illness.  EKGs/Labs Reviewed        09/15/2023: ALT 18; AST 20 09/21/2023: BUN 11; Creatinine, Ser 1.03; Potassium 4.0; Sodium 138   09/21/2023: Hemoglobin 11.7; WBC 9.0   05/20/2023: TSH 3.080    Physical Exam    VS:  BP 118/72   Pulse 79   Ht 5\' 8"  (1.727 m)   Wt 217 lb 3.2 oz (98.5 kg)   SpO2 98%   BMI 33.03 kg/m  , BMI Body mass index is 33.03 kg/m.  GEN: Well nourished, well developed, in no acute distress. Neck: No JVD or carotid bruits. Cardiac:  RRR. No murmurs. No rubs or gallops.   Respiratory:  Respirations regular and unlabored. Clear to auscultation without rales, wheezing or rhonchi. GI: Soft, nontender, nondistended. Extremities: Radials/DP/PT 2+ and equal bilaterally. No clubbing or cyanosis. No edema. Right wrist cath site well healed without erythema, edema, ecchymosis, or tenderness to palpation.  Skin: Warm and dry, no rash. Neuro: Strength intact.  Assessment & Plan   CAD S/p PCI with DES to proximal LAD April 2021.  LHC January 2025 demonstrated significant in-stent restenosis in the LAD with disease extending outside the stent proximally and distally as well as ostial D1 disease.  Patient underwent CABG x 2 on 09/19/2023.  Patient denies chest pain or shortness of breath. He has incisional discomfort that resolves with pain medication. He reports continued sensation of heart pounding. He reports he had this same sensation in 2021 that resolved with stent placement. It returned in late 2024 and he is frustrated that it has not resolved with bypass. Loop recorder has not demonstrated any arrhythmias.  Right wrist cath site well healed without erythema, edema, ecchymosis or tenderness to palpation; 2+ radial pulse. Sternal incision well approximated without drainage.  -Start  isosorbide 15 mg daily.  -Continue aspirin, metoprolol, rosuvastatin.  Chronic HFrEF Echo January 2025 showed EF 40 to 45%, severe hypokinesis of the mid to distal anterior and anteroseptal wall with thinning of the myocardium, Grade I DD, normal RV size/function, no significant valvular abnormalities.  RHC January 2025 showed normal filling pressures, normal PA pressure, normal cardiac output.  Patient denies lower extremity edema or dyspnea.  Euvolemic and well compensated on exam. -Continue metoprolol.   Hypertension BP today 118/72. No headaches or dizziness reported.  -Continue metoprolol.  Hyperlipidemia LDL November 2024 49, at goal. -Continue rosuvastatin.  Disposition: Start isosorbide 15 mg daily. Return in 3 months to Dr. Kirke Corin (patient would like to see Dr. Kirke Corin only for future visits).     Cardiac Rehabilitation Eligibility Assessment  The patient has declined.        Signed, Etta Grandchild. Jenie Parish, DNP, NP-C

## 2023-10-10 ENCOUNTER — Ambulatory Visit: Attending: Student | Admitting: Student

## 2023-10-10 ENCOUNTER — Encounter: Payer: Self-pay | Admitting: Student

## 2023-10-10 VITALS — BP 118/72 | HR 79 | Ht 68.0 in | Wt 217.2 lb

## 2023-10-10 DIAGNOSIS — E785 Hyperlipidemia, unspecified: Secondary | ICD-10-CM | POA: Diagnosis not present

## 2023-10-10 DIAGNOSIS — I5022 Chronic systolic (congestive) heart failure: Secondary | ICD-10-CM | POA: Diagnosis not present

## 2023-10-10 DIAGNOSIS — I10 Essential (primary) hypertension: Secondary | ICD-10-CM | POA: Diagnosis not present

## 2023-10-10 DIAGNOSIS — I25118 Atherosclerotic heart disease of native coronary artery with other forms of angina pectoris: Secondary | ICD-10-CM | POA: Diagnosis not present

## 2023-10-10 MED ORDER — ISOSORBIDE MONONITRATE ER 30 MG PO TB24: 15.0000 mg | ORAL_TABLET | Freq: Every day | ORAL | 3 refills | Status: DC

## 2023-10-10 NOTE — Patient Instructions (Signed)
 Medication Instructions:  Your physician recommends the following medication changes.  START TAKING: Isosorbide Mononitrate (Imdur) 15 mg daily  *If you need a refill on your cardiac medications before your next appointment, please call your pharmacy*   Lab Work: None ordered at this time    Follow-Up: At [facility], you and your health needs are our priority.  As part of our continuing mission to provide you with exceptional heart care, we have created designated Provider Care Teams.  These Care Teams include your primary Cardiologist (physician) and Advanced Practice Providers (APPs -  Physician Assistants and Nurse Practitioners) who all work together to provide you with the care you need, when you need it.   Your next appointment:   3-4 month(s)  Provider:   You may see Lorine Bears, MD

## 2023-10-13 ENCOUNTER — Other Ambulatory Visit: Payer: Self-pay

## 2023-10-17 ENCOUNTER — Ambulatory Visit: Payer: Managed Care, Other (non HMO)

## 2023-10-17 DIAGNOSIS — I639 Cerebral infarction, unspecified: Secondary | ICD-10-CM

## 2023-10-18 LAB — CUP PACEART REMOTE DEVICE CHECK
Date Time Interrogation Session: 20250330230640
Implantable Pulse Generator Implant Date: 20220405

## 2023-10-19 NOTE — Addendum Note (Signed)
 Addended by: Geralyn Flash D on: 10/19/2023 11:01 AM   Modules accepted: Orders

## 2023-10-19 NOTE — Progress Notes (Signed)
 [facility] Summary Report / Loop Recorder

## 2023-10-22 ENCOUNTER — Encounter: Payer: Self-pay | Admitting: Cardiology

## 2023-10-24 ENCOUNTER — Other Ambulatory Visit: Payer: Self-pay | Admitting: Surgery

## 2023-10-24 DIAGNOSIS — I25118 Atherosclerotic heart disease of native coronary artery with other forms of angina pectoris: Secondary | ICD-10-CM

## 2023-10-26 ENCOUNTER — Ambulatory Visit
Admission: RE | Admit: 2023-10-26 | Discharge: 2023-10-26 | Disposition: A | Discharge status: Home / Self Care | Source: Ambulatory Visit | Attending: Surgery | Admitting: Surgery

## 2023-10-26 ENCOUNTER — Ambulatory Visit (INDEPENDENT_AMBULATORY_CARE_PROVIDER_SITE_OTHER): Payer: Self-pay | Admitting: Surgery

## 2023-10-26 ENCOUNTER — Encounter: Payer: Self-pay | Admitting: Surgery

## 2023-10-26 VITALS — BP 137/79 | HR 92 | Resp 18 | Ht 68.0 in | Wt 213.0 lb

## 2023-10-26 DIAGNOSIS — I25118 Atherosclerotic heart disease of native coronary artery with other forms of angina pectoris: Secondary | ICD-10-CM

## 2023-10-26 DIAGNOSIS — Z951 Presence of aortocoronary bypass graft: Secondary | ICD-10-CM

## 2023-10-26 NOTE — Progress Notes (Signed)
 HPI: Patient returns for routine postoperative follow-up having undergone coronary artery bypass graft surgery x 2 on 09/19/2023. The patient's early postoperative recovery while in the hospital was notable for an uncomplicated postoperative course. Since hospital discharge the patient reports that he has been feeling well.  He still has some chest wall pains to the left of his sternal incision likely related to his left internal mammary artery harvest.  He denies any shortness of breath.  He has not been walking as much as he should.   Current Outpatient Medications  Medication Sig Dispense Refill   acetaminophen (TYLENOL) 325 MG tablet Take 2 tablets (650 mg total) by mouth every 6 (six) hours as needed.     aspirin EC 325 MG tablet Take 1 tablet (325 mg total) by mouth daily.     Continuous Glucose Sensor (FREESTYLE LIBRE 3 PLUS SENSOR) MISC Change sensor every 15 days. 2 each 6   diazepam (VALIUM) 2 MG tablet Take 1 tablet (2 mg total) by mouth daily as needed for anxiety. 30 tablet 0   glucose blood (CONTOUR NEXT TEST) test strip 1 each by Other route 4 (four) times daily - after meals and at bedtime. Use as instructed 100 each 12   Insulin Glargine (BASAGLAR KWIKPEN) 100 UNIT/ML Inject 15 Units into the skin at bedtime. Titrate to Fasting Blood Sugar of 100- 150 mg/dL with morning blood sugar check. (Patient taking differently: Inject 15 Units into the skin at bedtime as needed (high blood glucose). Titrate to Fasting Blood Sugar of 100- 150 mg/dL with morning blood sugar check.) 15 mL 5   insulin lispro (HUMALOG) 100 UNIT/ML KwikPen Inject 10 Units into the skin 3 (three) times daily. As directed (Patient taking differently: Inject 0-10 Units into the skin 3 (three) times daily. Sliding scale Above 170 take 4 units) 15 mL 5   isosorbide mononitrate (IMDUR) 30 MG 24 hr tablet Take 0.5 tablets (15 mg total) by mouth daily. 45 tablet 3   metFORMIN (GLUCOPHAGE-XR) 750 MG 24 hr tablet Take 1  tablet (750 mg total) by mouth 2 (two) times daily after a meal. (Patient taking differently: Take 750 mg by mouth daily with breakfast.) 180 tablet 3   metoprolol tartrate (LOPRESSOR) 50 MG tablet Take 1 tablet (50 mg total) by mouth 2 (two) times daily. 30 tablet 1   oxyCODONE (OXY IR/ROXICODONE) 5 MG immediate release tablet Take 1 tablet (5 mg total) by mouth every 6 (six) hours as needed for severe pain (pain score 7-10). 28 tablet 0   rosuvastatin (CRESTOR) 20 MG tablet Take 1 tablet (20 mg total) by mouth every evening. 90 tablet 3   Semaglutide, 2 MG/DOSE, 8 MG/3ML SOPN Inject 2 mg as directed once a week. (Patient taking differently: Inject 1 mg as directed every Sunday.) 3 mL 11   zolpidem (AMBIEN) 10 MG tablet Take 1 tablet (10 mg total) by mouth at bedtime as needed. 30 tablet 5   No current facility-administered medications for this visit.    Physical Exam: BP 137/79   Pulse 92   Resp 18   Ht 5\' 8"  (1.727 m)   Wt 213 lb (96.6 kg)   SpO2 96%   BMI 32.39 kg/m  He looks well. Cardiac exam shows regular rate and rhythm with normal heart sounds.  There is no murmur. Lungs are clear. The chest incision is healing well and the sternum is stable. There is no peripheral edema.  Diagnostic Tests:  Narrative & Impression  CLINICAL DATA:  Status post coronary artery bypass graft.   EXAM: CHEST - 2 VIEW   COMPARISON:  September 21, 2023.   FINDINGS: The heart size and mediastinal contours are within normal limits. Both lungs are clear. The visualized skeletal structures are unremarkable.   IMPRESSION: No active cardiopulmonary disease.     Electronically Signed   By: Lupita Raider M.D.   On: 10/26/2023 12:30    Impression:  The patient is doing well 5 weeks following his surgery.  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  I encouraged him to increase the amount he is walking.  I will plan to see him back  in the first week of May to decide about returning to work.  Plan:  Return to see me in the first week of May.   Alleen Borne, MD [facility] [phone]

## 2023-11-09 ENCOUNTER — Other Ambulatory Visit (HOSPITAL_COMMUNITY): Payer: Self-pay

## 2023-11-09 ENCOUNTER — Telehealth: Payer: Self-pay

## 2023-11-09 NOTE — Telephone Encounter (Signed)
 Pharmacy Patient Advocate Encounter   Received notification from Onbase that prior authorization for FreeStyle Libre 3 Plus Sensor is required/requested.   Insurance verification completed.   The patient is insured through [employer] .   Per test claim: PA required; PA submitted to above mentioned insurance via CoverMyMeds Key/confirmation #/EOC [facility] Status is pending

## 2023-11-10 ENCOUNTER — Ambulatory Visit (INDEPENDENT_AMBULATORY_CARE_PROVIDER_SITE_OTHER): Admitting: Family Medicine

## 2023-11-10 VITALS — BP 122/75 | HR 85 | Ht 68.0 in | Wt 210.0 lb

## 2023-11-10 DIAGNOSIS — E11649 Type 2 diabetes mellitus with hypoglycemia without coma: Secondary | ICD-10-CM | POA: Diagnosis not present

## 2023-11-10 DIAGNOSIS — E1159 Type 2 diabetes mellitus with other circulatory complications: Secondary | ICD-10-CM

## 2023-11-10 DIAGNOSIS — E1139 Type 2 diabetes mellitus with other diabetic ophthalmic complication: Secondary | ICD-10-CM | POA: Diagnosis not present

## 2023-11-10 DIAGNOSIS — I2510 Atherosclerotic heart disease of native coronary artery without angina pectoris: Secondary | ICD-10-CM

## 2023-11-10 DIAGNOSIS — E1169 Type 2 diabetes mellitus with other specified complication: Secondary | ICD-10-CM

## 2023-11-10 DIAGNOSIS — I152 Hypertension secondary to endocrine disorders: Secondary | ICD-10-CM

## 2023-11-10 DIAGNOSIS — E785 Hyperlipidemia, unspecified: Secondary | ICD-10-CM

## 2023-11-10 DIAGNOSIS — Z794 Long term (current) use of insulin: Secondary | ICD-10-CM

## 2023-11-10 MED ORDER — INSULIN LISPRO (1 UNIT DIAL) 100 UNIT/ML (KWIKPEN): PEN_INJECTOR | SUBCUTANEOUS | 5 refills | Status: AC

## 2023-11-10 MED ORDER — CONTOUR NEXT EZ W/DEVICE KIT: 1.0000 | PACK | 1 refills | Status: AC

## 2023-11-10 MED ORDER — ROSUVASTATIN CALCIUM 20 MG PO TABS: 20.0000 mg | ORAL_TABLET | Freq: Every evening | ORAL | 3 refills | Status: DC

## 2023-11-10 MED ORDER — BASAGLAR KWIKPEN 100 UNIT/ML ~~LOC~~ SOPN: 5.0000 [IU] | PEN_INJECTOR | Freq: Every day | SUBCUTANEOUS | 3 refills | Status: AC

## 2023-11-10 NOTE — Assessment & Plan Note (Signed)
 Chronic hypertension, well-controlled with current medication regimen. Blood pressure reading at 122/75 mmHg. - Continue metoprolol  50 mg twice daily as directed  - pt will follow up with cardiology as scheduled

## 2023-11-10 NOTE — Progress Notes (Signed)
 Established patient visit   Patient: [name]   DOB: [birth year]   63 y.o. Male  MRN: [id number] Visit Date: 11/10/2023  Today's healthcare provider: Mimi Alt, MD   Chief Complaint  Patient presents with   Establish Care    Discuss medication    Subjective     HPI     Establish Care    Additional comments: Discuss medication       Last edited by Bart Lieu, CMA on 11/10/2023  1:14 PM.       Discussed the use of AI scribe software for clinical note transcription with the patient, who gave verbal consent to proceed.  History of Present Illness [name] "[name]" is a 63 year old male with hypertension and diabetes who presents to transfer care and discuss medications.  He has hypertension, well controlled with metoprolol  50 mg twice daily, and hyperlipidemia, managed with rosuvastatin  20 mg daily. He also has insomnia, managed with Ambien  2 mg at night.  He has diabetes, previously managed with Lantus  15 units daily, Humalog  10 units three times daily with meals, and metformin  750 mg twice daily. However, he has not been taking the prescribed doses of insulin  as he finds them too high, particularly after his double bypass surgery on September 19, 2023. He mentions that taking 60 units of long-acting insulin  at night would 'plummet' him, and he currently takes only 5 units of Humalog  as needed, especially after meals that raise his blood sugar significantly. He wants to start fresh with his diabetes management, as his A1c was 6.1% at the end of February 2025, which he feels may not reflect his current control. His blood sugar readings vary, with fasting levels typically between 150 and 220 mg/dL.  He has coronary artery disease, having undergone double bypass surgery on September 19, 2023. His cardiologist had concerns about his insulin  regimen, questioning why he was on three different types of insulin . He is on short-term disability following his  surgery and plans to return to work in May 2025, but he is concerned about the lack of accumulated leave time.  He uses a [employer] 3 continuous glucose monitor, which he finds less accurate compared to his older meter that uses test strips. His continuous glucose readings are consistently 30 points lower than his test strip readings. He is interested in obtaining a new meter through his insurance.  He has a history of colon cancer and has undergone multiple colonoscopies in the past. He is reluctant to undergo further colonoscopies due to the preparation process and the need for transportation assistance. He has had tubular adenomas in the past, but they were negative for malignancy.  He also has psoriasis, which was previously managed with a cream prescribed by a doctor in [facility]. He is not currently under treatment for this condition.     Past Medical History:  Diagnosis Date   Anemia 10/03/2009   Qualifier: Diagnosis of  By: Marthe Slain    Anxiety associated with depression 10/11/2013   pt states he is no longer having this issue because he is happy with his job now   Colon cancer [facility])    right colon cancer- adenocarcinoma CEA level isnrmal at 1.8   Coronary artery disease    Diabetes mellitus    Diabetes mellitus type 2 in obese 03/20/2010   Qualifier: Diagnosis of  By: Marthe Slain     Erectile dysfunction 06/22/2016   Essential  hypertension 06/05/2014   FATTY LIVER DISEASE 11/04/2009   Qualifier: Diagnosis of  By: Elvin Hammer MD, Murel Arlington   Qualifier: Diagnosis of  By: Elvin Hammer MD, Murel Arlington  Last Assessment & Plan:  conirmed by CT scan of abdomen in April 2016 encouraged to minimize simple carbs and fatty foods.   GERD 11/04/2009   Qualifier: Diagnosis of  By: Elvin Hammer MD, Murel Arlington    Great toe pain, right 04/18/2017   Hepatic artery stenosis [facility])    History of colon cancer 01/02/2010   Qualifier: Diagnosis of  By: Marthe Slain Dr Elvin Hammer  Last Assessment & Plan:   Follows closely with gastroenterology, Dr Elvin Hammer   Hyperlipidemia    Hyperlipidemia, mixed 09/07/2010   Qualifier: Diagnosis of  By: Marthe Slain    Hypertension    Hypertriglyceridemia 12/14/2010   Hypotestosteronism 06/22/2016   INSOMNIA, CHRONIC 03/20/2010   Qualifier: Diagnosis of  By: Marthe Slain Ambien  10 mg daily does not keep asleep AdvilPM is over sedating and has trouble waking up    Iron  deficiency anemia 2011   Low testosterone  06/22/2016   Malignant neoplasm of colon (HCC) 01/02/2010   Qualifier: Diagnosis of  By: Marthe Slain Dr Elvin Hammer    Muscle spasm of back 06/05/2014   Nipple pain 09/30/2016   Obesity    Palpitations 06/05/2014   Preventative health care 10/08/2012   Psoriasis    Psoriatic arthritis (HCC) 08/25/2017   Right knee pain 11/17/2011   Stroke (HCC)     Medications: Outpatient Medications Prior to Visit  Medication Sig   acetaminophen  (TYLENOL ) 325 MG tablet Take 2 tablets (650 mg total) by mouth every 6 (six) hours as needed.   aspirin  EC 325 MG tablet Take 1 tablet (325 mg total) by mouth daily.   Continuous Glucose Sensor (FREESTYLE LIBRE 3 PLUS SENSOR) MISC Change sensor every 15 days.   diazepam  (VALIUM ) 2 MG tablet Take 1 tablet (2 mg total) by mouth daily as needed for anxiety.   glucose blood (CONTOUR NEXT TEST) test strip 1 each by Other route 4 (four) times daily - after meals and at bedtime. Use as instructed   isosorbide  mononitrate (IMDUR ) 30 MG 24 hr tablet Take 0.5 tablets (15 mg total) by mouth daily.   metFORMIN  (GLUCOPHAGE -XR) 750 MG 24 hr tablet Take 1 tablet (750 mg total) by mouth 2 (two) times daily after a meal. (Patient taking differently: Take 750 mg by mouth daily with breakfast.)   metoprolol  tartrate (LOPRESSOR ) 50 MG tablet Take 1 tablet (50 mg total) by mouth 2 (two) times daily. (Patient not taking: Reported on 11/10/2023)   Semaglutide , 2 MG/DOSE, 8 MG/3ML SOPN Inject 2 mg as directed once a week. (Patient not  taking: Reported on 11/10/2023)   zolpidem  (AMBIEN ) 10 MG tablet Take 1 tablet (10 mg total) by mouth at bedtime as needed.   [DISCONTINUED] Insulin  Glargine (BASAGLAR  KWIKPEN) 100 UNIT/ML Inject 15 Units into the skin at bedtime. Titrate to Fasting Blood Sugar of 100- 150 mg/dL with morning blood sugar check. (Patient taking differently: Inject 15 Units into the skin at bedtime as needed (high blood glucose). Titrate to Fasting Blood Sugar of 100- 150 mg/dL with morning blood sugar check.)   [DISCONTINUED] insulin  lispro (HUMALOG ) 100 UNIT/ML KwikPen Inject 10 Units into the skin 3 (three) times daily. As directed (Patient taking differently: Inject 0-10 Units into the skin 3 (three) times daily. Sliding scale Above 170 take 4 units)   [  DISCONTINUED] oxyCODONE  (OXY IR/ROXICODONE ) 5 MG immediate release tablet Take 1 tablet (5 mg total) by mouth every 6 (six) hours as needed for severe pain (pain score 7-10). (Patient not taking: Reported on 11/10/2023)   [DISCONTINUED] rosuvastatin  (CRESTOR ) 20 MG tablet Take 1 tablet (20 mg total) by mouth every evening. (Patient not taking: Reported on 11/10/2023)   No facility-administered medications prior to visit.    Review of Systems  Last CBC Lab Results  Component Value Date   WBC 9.0 09/21/2023   HGB 11.7 (L) 09/21/2023   HCT 34.9 (L) 09/21/2023   MCV 96.7 09/21/2023   MCH 32.4 09/21/2023   RDW 12.3 09/21/2023   PLT 141 (L) 09/21/2023   Last metabolic panel Lab Results  Component Value Date   GLUCOSE 126 (H) 09/21/2023   NA 138 09/21/2023   K 4.0 09/21/2023   CL 103 09/21/2023   CO2 25 09/21/2023   BUN 11 09/21/2023   CREATININE 1.03 09/21/2023   GFRNONAA >60 09/21/2023   CALCIUM  8.3 (L) 09/21/2023   PHOS 2.8 10/29/2014   PROT 7.1 09/15/2023   ALBUMIN  3.8 09/15/2023   LABGLOB 2.7 05/20/2023   AGRATIO 1.6 11/25/2021   BILITOT 1.2 09/15/2023   ALKPHOS 51 09/15/2023   AST 20 09/15/2023   ALT 18 09/15/2023   ANIONGAP 10 09/21/2023    Last lipids Lab Results  Component Value Date   CHOL 111 05/20/2023   HDL 39 (L) 05/20/2023   LDLCALC 49 05/20/2023   LDLDIRECT 76.0 03/13/2021   TRIG 130 05/20/2023   CHOLHDL 1.9 04/05/2022   Last hemoglobin A1c Lab Results  Component Value Date   HGBA1C 6.1 (H) 09/15/2023   Last thyroid  functions Lab Results  Component Value Date   TSH 3.080 05/20/2023        Objective    BP 122/75   Pulse 85   Ht 5\' 8"  (1.727 m)   Wt 210 lb (95.3 kg)   SpO2 100%   BMI 31.93 kg/m  BP Readings from Last 3 Encounters:  11/10/23 122/75  10/26/23 137/79  10/10/23 118/72   Wt Readings from Last 3 Encounters:  11/10/23 210 lb (95.3 kg)  10/26/23 213 lb (96.6 kg)  10/10/23 217 lb 3.2 oz (98.5 kg)        Physical Exam Vitals reviewed.  Constitutional:      General: He is not in acute distress.    Appearance: Normal appearance. He is not ill-appearing, toxic-appearing or diaphoretic.  Eyes:     Conjunctiva/sclera: Conjunctivae normal.  Cardiovascular:     Rate and Rhythm: Normal rate and regular rhythm.     Pulses: Normal pulses.          Dorsalis pedis pulses are 2+ on the right side and 2+ on the left side.       Posterior tibial pulses are 2+ on the right side and 2+ on the left side.     Heart sounds: Normal heart sounds. No murmur heard.    No friction rub. No gallop.  Pulmonary:     Effort: Pulmonary effort is normal. No respiratory distress.     Breath sounds: Normal breath sounds. No stridor. No wheezing, rhonchi or rales.  Abdominal:     General: Bowel sounds are normal. There is no distension.     Palpations: Abdomen is soft.     Tenderness: There is no abdominal tenderness.  Musculoskeletal:     Right lower leg: No edema.     Left lower  leg: No edema.     Right foot: Normal range of motion. No deformity or bunion.     Left foot: Normal range of motion. No deformity or bunion.  Feet:     Right foot:     Protective Sensation: 6 sites tested.  6 sites  sensed.     Skin integrity: Skin integrity normal. No erythema.     Toenail Condition: Fungal disease present.    Left foot:     Protective Sensation: 6 sites tested.  6 sites sensed.     Skin integrity: Skin integrity normal. No erythema.     Toenail Condition: Fungal disease present. Skin:    Findings: Erythema and rash present. Rash is macular.       Neurological:     Mental Status: He is alert and oriented to person, place, and time.  Psychiatric:        Mood and Affect: Mood and affect normal.        Speech: Speech normal.        Behavior: Behavior normal. Behavior is cooperative.       No results found for any visits on 11/10/23.  Assessment & Plan     Problem List Items Addressed This Visit       Cardiovascular and Mediastinum   Hypertension associated with diabetes (HCC) - Primary   Chronic hypertension, well-controlled with current medication regimen. Blood pressure reading at 122/75 mmHg. - Continue metoprolol  50 mg twice daily as directed  - pt will follow up with cardiology as scheduled       Relevant Medications   Insulin  Glargine (BASAGLAR  KWIKPEN) 100 UNIT/ML   insulin  lispro (HUMALOG ) 100 UNIT/ML KwikPen   rosuvastatin  (CRESTOR ) 20 MG tablet   Coronary artery disease   Coronary artery disease post-CABG with recent recovery. He is under cardiology care and has restrictions related to work and medication use post-surgery. He is awaiting cardiologist's approval to restart Ozempic  and plans to return to work with no restrictions by mid-May. - Follow up with cardiologist       Relevant Medications   rosuvastatin  (CRESTOR ) 20 MG tablet     Endocrine   Hyperlipidemia associated with type 2 diabetes mellitus (HCC)   Chronic hyperlipidemia, currently managed with rosuvastatin . No recent lipid panel results discussed. - Continue rosuvastatin  20 mg daily. - Send prescription for rosuvastatin  to [facility].      Relevant Medications   Insulin  Glargine  (BASAGLAR  KWIKPEN) 100 UNIT/ML   insulin  lispro (HUMALOG ) 100 UNIT/ML KwikPen   rosuvastatin  (CRESTOR ) 20 MG tablet   Diabetes mellitus (HCC)   Type 2 diabetes mellitus with a history of fluctuating A1c levels. Recent A1c was 6.1%, indicating well-controlled diabetes. He has a history of coronary artery bypass graft (CABG) and is currently on multiple diabetes medications. There is a need to simplify the regimen and ensure safe blood glucose levels. He prefers to have both long-acting and short-acting insulin  available for use as needed. Held long discussion regarding insulin  doses, glucose levels, diabetes control, insurance coverage  - Discontinue Humalog  and Basaglar  insulin . - Continue metformin  750 mg twice daily. - Plan to restart Ozempic  pending cardiologist's approval. - Prescribe Contour Next EZ glucose meter for more accurate blood glucose monitoring. - Continue using Freestyle Libre 3 for continuous glucose monitoring. - Prescribe Humalog  3 units if blood glucose is greater than 250 mg/dL up to three times a day with meals. - Prescribe glargine insulin  5 units daily at bedtime. - Schedule follow-up for A1c testing  in October.      Relevant Medications   Blood Glucose Monitoring Suppl (CONTOUR NEXT EZ) w/Device KIT   Insulin  Glargine (BASAGLAR  KWIKPEN) 100 UNIT/ML   insulin  lispro (HUMALOG ) 100 UNIT/ML KwikPen   rosuvastatin  (CRESTOR ) 20 MG tablet    Assessment & Plan   Colon Cancer Screening  History of colon cancer with previous follow-up colonoscopies. He refuses further colonoscopies due to personal preference and discomfort with the procedure. - Document refusal for further colonoscopies. - Discuss alternative surveillance methods if available.  Psoriasis Chronic  Psoriasis with no current treatment. Last treatment was a topical cream prescribed by a previous provider. - Consider referral to dermatology for management of psoriasis.  Insomnia Chronic insomnia managed  with Ambien . - Continue Ambien  2 mg nightly.  Request MyEye Doc for DM eye exam    Return in about 6 months (around 05/11/2024) for DM, HTN (A1c &labs).         Mimi Alt, MD  [facility] [phone] (phone) [phone] (fax)  [facility]

## 2023-11-10 NOTE — Assessment & Plan Note (Signed)
 Coronary artery disease post-CABG with recent recovery. He is under cardiology care and has restrictions related to work and medication use post-surgery. He is awaiting cardiologist's approval to restart Ozempic  and plans to return to work with no restrictions by mid-May. - Follow up with cardiologist

## 2023-11-10 NOTE — Assessment & Plan Note (Signed)
 Chronic hyperlipidemia, currently managed with rosuvastatin . No recent lipid panel results discussed. - Continue rosuvastatin  20 mg daily. - Send prescription for rosuvastatin  to [facility].

## 2023-11-10 NOTE — Assessment & Plan Note (Addendum)
 Type 2 diabetes mellitus with a history of fluctuating A1c levels. Recent A1c was 6.1%, indicating well-controlled diabetes. He has a history of coronary artery bypass graft (CABG) and is currently on multiple diabetes medications. There is a need to simplify the regimen and ensure safe blood glucose levels. He prefers to have both long-acting and short-acting insulin  available for use as needed. Held long discussion regarding insulin  doses, glucose levels, diabetes control, insurance coverage  - Discontinue Humalog  and Basaglar  insulin . - Continue metformin  750 mg twice daily. - Plan to restart Ozempic  pending cardiologist's approval. - Prescribe Contour Next EZ glucose meter for more accurate blood glucose monitoring. - Continue using Freestyle Libre 3 for continuous glucose monitoring. - Prescribe Humalog  3 units if blood glucose is greater than 250 mg/dL up to three times a day with meals. - Prescribe glargine insulin  5 units daily at bedtime. - Schedule follow-up for A1c testing in October.

## 2023-11-11 ENCOUNTER — Other Ambulatory Visit (HOSPITAL_COMMUNITY): Payer: Self-pay

## 2023-11-11 ENCOUNTER — Telehealth: Payer: Self-pay

## 2023-11-11 NOTE — Telephone Encounter (Signed)
 Please see the message below.

## 2023-11-11 NOTE — Telephone Encounter (Signed)
 Please update patient on insurance preferences for coverage for Semglee  for long acting insulin . This will replace the basaglar . I can order the replacement insulin  if patient agrees with switching

## 2023-11-11 NOTE — Telephone Encounter (Signed)
 Attempted to contact the pt but was only able to leave a voice message asking him to contact the office back regarding this matter

## 2023-11-11 NOTE — Telephone Encounter (Signed)
 Pharmacy Patient Advocate Encounter   Received notification from Onbase that prior authorization for Basaglar  Kwikpen 100 unit/ml is required/requested.   Insurance verification completed.   The patient is insured through [employer] .   Per test claim:  Semglee  is preferred by the insurance.  If suggested medication is appropriate, Please send in a new RX and discontinue this one. If not, please advise as to why it's not appropriate so that we may request a Prior Authorization. Please note, some preferred medications may still require a PA.  If the suggested medications have not been trialed and there are no contraindications to their use, the PA will not be submitted, as it will not be approved.

## 2023-11-15 NOTE — Telephone Encounter (Signed)
 Attempted to contact the pt but was only able to leave a voice message asking him to contact the office back regarding this matter

## 2023-11-16 ENCOUNTER — Other Ambulatory Visit (HOSPITAL_COMMUNITY): Payer: Self-pay

## 2023-11-16 NOTE — Telephone Encounter (Signed)
 Pharmacy Patient Advocate Encounter  Received notification from [employer] that Prior Authorization for FreeStyle Libre 3 Plus Sensor has been APPROVED from 11/09/23 to 11/13/24. Ran test claim, Copay is $0. This test claim was processed through [facility]- copay amounts may vary at other pharmacies due to pharmacy/plan contracts, or as the patient moves through the different stages of their insurance plan.   PA #/Case ID/Reference #: [id number]

## 2023-11-21 ENCOUNTER — Ambulatory Visit (INDEPENDENT_AMBULATORY_CARE_PROVIDER_SITE_OTHER): Payer: Managed Care, Other (non HMO)

## 2023-11-21 DIAGNOSIS — I639 Cerebral infarction, unspecified: Secondary | ICD-10-CM

## 2023-11-21 LAB — CUP PACEART REMOTE DEVICE CHECK
Date Time Interrogation Session: 20250504231104
Implantable Pulse Generator Implant Date: 20220405

## 2023-11-23 ENCOUNTER — Ambulatory Visit: Payer: Self-pay | Attending: Surgery | Admitting: Surgery

## 2023-11-23 ENCOUNTER — Encounter: Payer: Self-pay | Admitting: *Deleted

## 2023-11-23 ENCOUNTER — Encounter: Payer: Self-pay | Admitting: Surgery

## 2023-11-23 ENCOUNTER — Encounter: Payer: Self-pay | Admitting: Cardiology

## 2023-11-23 VITALS — BP 122/73 | HR 75 | Resp 18 | Ht 68.0 in | Wt 208.0 lb

## 2023-11-23 DIAGNOSIS — I25118 Atherosclerotic heart disease of native coronary artery with other forms of angina pectoris: Secondary | ICD-10-CM

## 2023-11-23 DIAGNOSIS — Z951 Presence of aortocoronary bypass graft: Secondary | ICD-10-CM

## 2023-11-23 MED ORDER — METOPROLOL TARTRATE 50 MG PO TABS: 50.0000 mg | ORAL_TABLET | Freq: Two times a day (BID) | ORAL | 3 refills | Status: DC

## 2023-11-23 NOTE — Progress Notes (Signed)
 HPI: Patient returns for routine postoperative follow-up having undergone CABG x 2 on 09/19/2023. The patient's early postoperative recovery while in the hospital was notable for an uncomplicated postop course. Since hospital discharge the patient reports that he has felt well. He has been ambulating without chest pain or shortness of breath and his stamina is improving. He has been watching his diet and lost some weight. He feels like he is ready to return to work.   Current Outpatient Medications  Medication Sig Dispense Refill   acetaminophen  (TYLENOL ) 325 MG tablet Take 2 tablets (650 mg total) by mouth every 6 (six) hours as needed.     aspirin  EC 325 MG tablet Take 1 tablet (325 mg total) by mouth daily.     Blood Glucose Monitoring Suppl (CONTOUR NEXT EZ) w/Device KIT 1 each by Does not apply route as directed. 1 kit 1   Continuous Glucose Sensor (FREESTYLE LIBRE 3 PLUS SENSOR) MISC Change sensor every 15 days. 2 each 6   diazepam  (VALIUM ) 2 MG tablet Take 1 tablet (2 mg total) by mouth daily as needed for anxiety. 30 tablet 0   glucose blood (CONTOUR NEXT TEST) test strip 1 each by Other route 4 (four) times daily - after meals and at bedtime. Use as instructed 100 each 12   Insulin  Glargine (BASAGLAR  KWIKPEN) 100 UNIT/ML Inject 5 Units into the skin at bedtime. Titrate to Fasting Blood Sugar of 100- 150 mg/dL with morning blood sugar check. 15 mL 3   insulin  lispro (HUMALOG ) 100 UNIT/ML KwikPen Only take 3 units with meals if BG is greater than 250 up to three times daily 15 mL 5   isosorbide  mononitrate (IMDUR ) 30 MG 24 hr tablet Take 0.5 tablets (15 mg total) by mouth daily. 45 tablet 3   metFORMIN  (GLUCOPHAGE -XR) 750 MG 24 hr tablet Take 1 tablet (750 mg total) by mouth 2 (two) times daily after a meal. (Patient taking differently: Take 750 mg by mouth daily with breakfast.) 180 tablet 3   metoprolol  tartrate (LOPRESSOR ) 50 MG tablet Take 1 tablet (50 mg total) by mouth 2 (two) times  daily. 60 tablet 3   rosuvastatin  (CRESTOR ) 20 MG tablet Take 1 tablet (20 mg total) by mouth every evening. 90 tablet 3   Semaglutide , 2 MG/DOSE, 8 MG/3ML SOPN Inject 2 mg as directed once a week. 3 mL 11   zolpidem  (AMBIEN ) 10 MG tablet Take 1 tablet (10 mg total) by mouth at bedtime as needed. 30 tablet 5   No current facility-administered medications for this visit.    Physical Exam: BP 122/73   Pulse 75   Resp 18   Ht 5\' 8"  (1.727 m)   Wt 208 lb (94.3 kg)   SpO2 97% Comment: RA  BMI 31.63 kg/m  He looks well Cardiac exam shows a regular rate and rhythm with normal heart sounds. Lungs are clear The chest incision is healing well and the sternum is stable.  His leg incision is healing well and there is no peripheral edema.  Diagnostic Tests:  Narrative & Impression  CLINICAL DATA:  Status post coronary artery bypass graft.   EXAM: CHEST - 2 VIEW   COMPARISON:  September 21, 2023.   FINDINGS: The heart size and mediastinal contours are within normal limits. Both lungs are clear. The visualized skeletal structures are unremarkable.   IMPRESSION: No active cardiopulmonary disease.     Electronically Signed   By: Rosalene Colon M.D.   On: 10/26/2023  12:30      Impression:  He is recovering well from his surgery. I think he can return to work without restrictions. I encouraged him to continue maintaining regular exercise and dietary modification with tight control of his diabetes.  Plan:  He will continue to followup with cardiology and will return to see me if there are any problems with his incisions.   Bartley Lightning, MD [facility] [phone]

## 2023-12-01 NOTE — Addendum Note (Signed)
 Addended by: Edra Govern D on: 12/01/2023 10:21 AM   Modules accepted: Orders

## 2023-12-01 NOTE — Progress Notes (Signed)
 [facility] Summary Report / Loop Recorder

## 2023-12-13 ENCOUNTER — Telehealth: Payer: Self-pay | Admitting: Family Medicine

## 2023-12-13 NOTE — Telephone Encounter (Signed)
[facility] faxed refill request for the following medications: ° °zolpidem (AMBIEN) 10 MG tablet  ° °Please advise. °

## 2023-12-14 ENCOUNTER — Other Ambulatory Visit: Payer: Self-pay

## 2023-12-14 DIAGNOSIS — F5104 Psychophysiologic insomnia: Secondary | ICD-10-CM

## 2023-12-15 MED ORDER — ZOLPIDEM TARTRATE 10 MG PO TABS
10.0000 mg | ORAL_TABLET | Freq: Every evening | ORAL | 0 refills | Status: DC | PRN
Start: 2023-12-15 — End: 2024-03-21

## 2023-12-22 ENCOUNTER — Ambulatory Visit (INDEPENDENT_AMBULATORY_CARE_PROVIDER_SITE_OTHER)

## 2023-12-22 DIAGNOSIS — I639 Cerebral infarction, unspecified: Secondary | ICD-10-CM

## 2023-12-22 LAB — CUP PACEART REMOTE DEVICE CHECK
Date Time Interrogation Session: 20250604231029
Implantable Pulse Generator Implant Date: 20220405

## 2023-12-24 ENCOUNTER — Ambulatory Visit: Payer: Self-pay | Admitting: Cardiology

## 2024-01-09 NOTE — Progress Notes (Signed)
 [facility] Summary Report / Loop Recorder

## 2024-01-23 ENCOUNTER — Ambulatory Visit

## 2024-01-23 DIAGNOSIS — I639 Cerebral infarction, unspecified: Secondary | ICD-10-CM

## 2024-01-23 LAB — CUP PACEART REMOTE DEVICE CHECK
Date Time Interrogation Session: 20250706231349
Implantable Pulse Generator Implant Date: 20220405

## 2024-01-28 ENCOUNTER — Ambulatory Visit: Payer: Self-pay | Admitting: Cardiology

## 2024-02-08 NOTE — Progress Notes (Signed)
 [facility] Summary Report / Loop Recorder

## 2024-02-23 ENCOUNTER — Ambulatory Visit

## 2024-02-23 DIAGNOSIS — I639 Cerebral infarction, unspecified: Secondary | ICD-10-CM

## 2024-02-23 LAB — CUP PACEART REMOTE DEVICE CHECK
Date Time Interrogation Session: 20250806231104
Implantable Pulse Generator Implant Date: 20220405

## 2024-02-24 ENCOUNTER — Ambulatory Visit: Payer: Self-pay | Admitting: Cardiology

## 2024-03-21 ENCOUNTER — Ambulatory Visit (INDEPENDENT_AMBULATORY_CARE_PROVIDER_SITE_OTHER): Admitting: Family Medicine

## 2024-03-21 ENCOUNTER — Encounter: Payer: Self-pay | Admitting: Family Medicine

## 2024-03-21 VITALS — BP 120/82 | HR 69 | Temp 98.6°F | Ht 68.0 in | Wt 182.0 lb

## 2024-03-21 DIAGNOSIS — Z23 Encounter for immunization: Secondary | ICD-10-CM | POA: Diagnosis not present

## 2024-03-21 DIAGNOSIS — D649 Anemia, unspecified: Secondary | ICD-10-CM

## 2024-03-21 DIAGNOSIS — Z951 Presence of aortocoronary bypass graft: Secondary | ICD-10-CM

## 2024-03-21 DIAGNOSIS — F5104 Psychophysiologic insomnia: Secondary | ICD-10-CM | POA: Diagnosis not present

## 2024-03-21 DIAGNOSIS — E785 Hyperlipidemia, unspecified: Secondary | ICD-10-CM | POA: Diagnosis not present

## 2024-03-21 DIAGNOSIS — I152 Hypertension secondary to endocrine disorders: Secondary | ICD-10-CM

## 2024-03-21 DIAGNOSIS — E1159 Type 2 diabetes mellitus with other circulatory complications: Secondary | ICD-10-CM | POA: Diagnosis not present

## 2024-03-21 DIAGNOSIS — F321 Major depressive disorder, single episode, moderate: Secondary | ICD-10-CM

## 2024-03-21 DIAGNOSIS — Z8673 Personal history of transient ischemic attack (TIA), and cerebral infarction without residual deficits: Secondary | ICD-10-CM

## 2024-03-21 DIAGNOSIS — E1169 Type 2 diabetes mellitus with other specified complication: Secondary | ICD-10-CM

## 2024-03-21 DIAGNOSIS — E1165 Type 2 diabetes mellitus with hyperglycemia: Secondary | ICD-10-CM

## 2024-03-21 DIAGNOSIS — Z794 Long term (current) use of insulin: Secondary | ICD-10-CM

## 2024-03-21 DIAGNOSIS — L4050 Arthropathic psoriasis, unspecified: Secondary | ICD-10-CM

## 2024-03-21 DIAGNOSIS — Z7689 Persons encountering health services in other specified circumstances: Secondary | ICD-10-CM

## 2024-03-21 MED ORDER — ZOLPIDEM TARTRATE 10 MG PO TABS: 10.0000 mg | ORAL_TABLET | Freq: Every evening | ORAL | 0 refills | Status: DC | PRN

## 2024-03-21 MED ORDER — ESCITALOPRAM OXALATE 10 MG PO TABS: 10.0000 mg | ORAL_TABLET | Freq: Every day | ORAL | 1 refills | Status: DC

## 2024-03-21 MED ORDER — ESCITALOPRAM OXALATE 10 MG PO TABS
10.0000 mg | ORAL_TABLET | Freq: Every day | ORAL | 1 refills | Status: DC
Start: 2024-03-21 — End: 2024-03-21

## 2024-03-21 NOTE — Progress Notes (Signed)
 Patient Office Visit  Assessment & Plan:  Encounter to establish care  Psychophysiological insomnia -     Zolpidem  Tartrate; Take 1 tablet (10 mg total) by mouth at bedtime as needed.  Dispense: 30 tablet; Refill: 0 -     TSH  Hyperlipidemia associated with type 2 diabetes mellitus (HCC) -     Lipid panel  S/P CABG x 2  Hypertension associated with diabetes (HCC)  Psoriatic arthritis (HCC) -     VITAMIN D  25 Hydroxy (Vit-D Deficiency, Fractures)  Depression, major, single episode, moderate (HCC) -     Escitalopram  Oxalate; Take 1 tablet (10 mg total) by mouth daily.  Dispense: 90 tablet; Refill: 1  History of stroke  Type 2 diabetes mellitus with hyperglycemia, with long-term current use of insulin  (HCC) -     Hemoglobin A1c  Anemia, unspecified type -     Iron , TIBC and Ferritin Panel  Needs flu shot -     Flu vaccine trivalent PF, 6mos and older(Flulaval,Afluria,Fluarix,Fluzone)   Assessment and Plan    Depression Chronic depression exacerbated by job loss and financial stress. No suicidal ideation. Previous Seroquel  use not ideal. Open to Lexapro . - Prescribe Lexapro  10mg  once per day. - Follow up in 4 weeks to assess response.  Type 2 diabetes mellitus with hypoglycemia and hyperglycemia Type 2 diabetes with glucose fluctuations due to meal skipping. Last A1c was 6.1. - Order blood work for A1c and diabetes markers. - Encourage regular meals.  Congestive heart failure and coronary artery disease, status post double bypass Cardiology follow-up scheduled. - Continue follow-up with cardiology on October 7th.  Anemia, unspecified Chronic anemia,- patient was not aware of this,  possible nutritional deficiencies or chronic disease. No recent colonoscopy. - Order iron  panel.  Patient declines ever doing another colonoscopy or doing Cologuard  Psoriatic arthritis with psoriasis Psoriatic arthritis with significant psoriasis. Under rheumatologist care with  elevated inflammatory markers.  Insomnia Chronic insomnia. Previous effective use of Ambien . Aware of dementia risk but willing to continue. - Prescribe Ambien  with caution.  Balance impairment Chronic balance impairment, possibly post-stroke. No recent physical therapy and declines this  Hypertension- stable   Hyperlipidemia- taking Crestor   General Health Maintenance Discussed flu vaccination. Medicaid coverage confirmed. - Administer flu shot.  Follow-Up Monitor response to new depression medication and overall health. - Schedule follow-up in 4 weeks.     Test results were reviewed and analyzed as part of the medical decision making of this visit.  Reviewed previous notes from [facility] family medicine during office visit.  Reviewed previous labs that were previously done and discussed with the patient today.  Flu shot given today.  Patient declines psychotherapy at this time.  Recommend healthy diet i.e mediterranean/DASH diet, consistent exercise - 30 minutes 5 day per week, and gradual weight loss.   Return in about 4 weeks (around 04/18/2024), or if symptoms worsen or fail to improve, for depression.   Subjective:    Patient ID: [name], male    DOB: 1961-06-15  Age: 63 y.o. MRN: [id number]  Chief Complaint  Patient presents with   Establish Care   Medical Management of Chronic Issues    HPI Discussed the use of AI scribe software for clinical note transcription with the patient, who gave verbal consent to proceed.  History of Present Illness        [name] is a 63 year old male who presents with concerns about his mental health and medication management. patient  is here to establish primary care in our office.   He has a history of heart disease, including a double bypass surgery in March 2025, and was hospitalized for congestive heart failure in January 2025. He is scheduled to see a cardiologist on April 24, 2024. He has not been taking  aspirin  recently, although it was prescribed previously.  He has type 2 diabetes, with his last A1c in February 2025 being 6.1. He experiences episodes of hypoglycemia, with blood sugar levels dropping to 54 mg/dL on March 20, 2024, and 59 mg/dL on March 17, 2024. A high reading of 251 mg/dL on March 15, 2024, was attributed to stress from losing his job. Financial difficulties are impacting his ability to maintain a regular diet.  He has a history of depression and feels 'unmotivated' but denies suicidal thoughts. He was previously prescribed Seroquel  and Ambien  for insomnia but has not taken Ambien  recently due to an expired prescription. He has not tried other antidepressants like Prozac, Zoloft , or Lexapro .  He reports balance issues. He experienced a stroke in 2022 and did not require physical therapy afterward. He started using a cane in the last two to three months due to worsening balance. He does not think he had another stroke. patient does not want to do any physical therapy at this time.   He has psoriatic arthritis, with symptoms affecting his feet, and severe psoriasis. He is under the care of a rheumatologist in [facility] [place] who has prescribed specialty medication. Recent lab work from March 01, 2024, indicated elevated inflammatory markers and anemia (CBC and CMP were done)  He lives alone with limited social support, as his family resides in [place] He has Medicaid coverage but is uncertain about its continuity.  This makes him very nervous about whether or not he had medicaid coverage going forward. He is a former smoker. Physical Exam Results LABS A1c: 6.1 (08/2023) Blood glucose: 251 (03/15/2024) Blood glucose: 59 (03/17/2024) Blood glucose: 54 (03/20/2024) Blood glucose: 106 (03/21/2024) Kidney function: normal (03/01/2024) C-reactive protein: elevated (03/01/2024) Sedimentation rate: elevated (03/01/2024) Hemoglobin: low (09/2023) Assessment &  Plan Depression Chronic depression exacerbated by job loss and financial stress. No suicidal ideation. Previous Seroquel  use not ideal. Open to Lexapro . - Prescribe Lexapro  10mg  once per day. - Follow up in 4 weeks to assess response.  Type 2 diabetes mellitus with recent hypoglycemia  Type 2 diabetes with glucose fluctuations due to meal skipping. Last A1c was 6.1. - Order blood work for A1c and diabetes markers. - Encourage regular meals.  Congestive heart failure and coronary artery disease, status post double bypass Cardiology follow-up scheduled.  - Continue follow-up with cardiology on October 7th.  Anemia, unspecified Chronic anemia,- patient was not aware of this,  possible nutritional deficiencies or chronic disease. No recent colonoscopy. - Order iron  panel.  Patient declines ever doing another colonoscopy or doing Cologuard  Psoriatic arthritis with psoriasis Psoriatic arthritis with significant psoriasis. Under rheumatologist care with elevated inflammatory markers.  Insomnia Chronic insomnia. Previous effective use of Ambien . Aware of dementia risk but willing to continue. - Prescribe Ambien  with caution.  Balance impairment Chronic balance impairment, possibly post-stroke. No recent physical therapy and declines this  Hypertension- stable on current regimen.   Hyperlipidemia- taking Crestor  and will continue this.   General Health Maintenance Discussed flu vaccination. Medicaid coverage confirmed. - Administer flu shot.  Follow-Up Monitor response to new depression medication and overall health. - Schedule follow-up in 4 weeks.  The ASCVD Risk score (Arnett DK, et al., 2019) failed to calculate for the following reasons:   Risk score cannot be calculated because patient has a medical history suggesting prior/existing ASCVD  Past Medical History:  Diagnosis Date   Anemia 10/03/2009   Qualifier: Diagnosis of  By: Georgian ROSALEA CHARM Lamar    Anxiety associated  with depression 10/11/2013   pt states he is no longer having this issue because he is happy with his job now   Colon cancer [facility])    right colon cancer- adenocarcinoma CEA level isnrmal at 1.8   Coronary artery disease    Diabetes mellitus    Diabetes mellitus type 2 in obese 03/20/2010   Qualifier: Diagnosis of  By: Georgian ROSALEA CHARM Lamar     Erectile dysfunction 06/22/2016   Essential hypertension 06/05/2014   FATTY LIVER DISEASE 11/04/2009   Qualifier: Diagnosis of  By: Abran MD, Norleen SAILOR   Qualifier: Diagnosis of  By: Abran MD, Norleen SAILOR  Last Assessment & Plan:  conirmed by CT scan of abdomen in April 2016 encouraged to minimize simple carbs and fatty foods.   GERD 11/04/2009   Qualifier: Diagnosis of  By: Abran MD, Norleen SAILOR    Great toe pain, right 04/18/2017   Hepatic artery stenosis [facility])    History of colon cancer 01/02/2010   Qualifier: Diagnosis of  By: Georgian ROSALEA CHARM Lamar Dr Abran  Last Assessment & Plan:  Follows closely with gastroenterology, Dr Abran   Hyperlipidemia    Hyperlipidemia, mixed 09/07/2010   Qualifier: Diagnosis of  By: Georgian ROSALEA CHARM Lamar    Hypertension    Hypertriglyceridemia 12/14/2010   Hypotestosteronism 06/22/2016   INSOMNIA, CHRONIC 03/20/2010   Qualifier: Diagnosis of  By: Georgian ROSALEA CHARM Lamar Ambien  10 mg daily does not keep asleep AdvilPM is over sedating and has trouble waking up    Iron  deficiency anemia 2011   Low testosterone  06/22/2016   Malignant neoplasm of colon (HCC) 01/02/2010   Qualifier: Diagnosis of  By: Georgian ROSALEA CHARM Lamar Dr Abran    Muscle spasm of back 06/05/2014   Nipple pain 09/30/2016   Obesity    Palpitations 06/05/2014   Preventative health care 10/08/2012   Psoriasis    Psoriatic arthritis (HCC) 08/25/2017   Right knee pain 11/17/2011   Stroke [facility])    Past Surgical History:  Procedure Laterality Date   BUBBLE STUDY  10/21/2020   Procedure: BUBBLE STUDY;  Surgeon: Mona Vinie BROCKS, MD;  Location: [facility] ENDOSCOPY;  Service:  Cardiovascular;;   CARPAL TUNNEL RELEASE     RIGHT   CHOLECYSTECTOMY  1994   COLONOSCOPY     CORONARY ARTERY BYPASS GRAFT N/A 09/19/2023   Procedure: CORONARY ARTERY BYPASS GRAFTING (CABG) TIMES TWO USING LEFT INTERNAL MAMMARY ARTERY AND ENDOSCOPICALLY HARVESTED RIGHT GREATER SAPHENOUS VEIN;  Surgeon: Lucas Dorise POUR, MD;  Location: [facility] OR;  Service: Open Heart Surgery;  Laterality: N/A;   CORONARY/GRAFT ACUTE MI REVASCULARIZATION N/A 10/27/2019   Procedure: Coronary/Graft Acute MI Revascularization;  Surgeon: Darron Deatrice LABOR, MD;  Location: [facility] INVASIVE CV LAB;  Service: Cardiovascular;  Laterality: N/A;   HEMICOLECTOMY     12/17/2009 right   LEFT HEART CATH AND CORONARY ANGIOGRAPHY N/A 10/27/2019   Procedure: LEFT HEART CATH AND CORONARY ANGIOGRAPHY;  Surgeon: Darron Deatrice LABOR, MD;  Location: [facility] INVASIVE CV LAB;  Service: Cardiovascular;  Laterality: N/A;   LOOP RECORDER INSERTION N/A 10/21/2020   Procedure: LOOP RECORDER INSERTION;  Surgeon: Kelsie Agent,  MD;  Location: [facility] INVASIVE CV LAB;  Service: Cardiovascular;  Laterality: N/A;   POLYPECTOMY     RIGHT/LEFT HEART CATH AND CORONARY ANGIOGRAPHY Bilateral 08/12/2023   Procedure: RIGHT/LEFT HEART CATH AND CORONARY ANGIOGRAPHY;  Surgeon: Darron Deatrice LABOR, MD;  Location: [facility] INVASIVE CV LAB;  Service: Cardiovascular;  Laterality: Bilateral;   TEE WITHOUT CARDIOVERSION N/A 10/21/2020   Procedure: TRANSESOPHAGEAL ECHOCARDIOGRAM (TEE);  Surgeon: Mona Vinie BROCKS, MD;  Location[facility] ENDOSCOPY;  Service: Cardiovascular;  Laterality: N/A;   TEE WITHOUT CARDIOVERSION N/A 09/19/2023   Procedure: TRANSESOPHAGEAL ECHOCARDIOGRAM (TEE);  Surgeon: Lucas Dorise POUR, MD;  Location: [facility] OR;  Service: Open Heart Surgery;  Laterality: N/A;   Social History   Tobacco Use   Smoking status: Former    Current packs/day: 0.00    Types: Cigarettes    Quit date: 07/16/2023    Years since quitting: 0.6   Smokeless tobacco: Never   Tobacco comments:    Smokes 1 Pack  per week; started back 3 months ago  Vaping Use   Vaping status: Never Used  Substance Use Topics   Alcohol use: No   Drug use: No   Family History  Problem Relation Age of Onset   Stomach cancer Mother        diedinher 64's   Diabetes type II Brother        boderline   Diabetes Brother        type II   Alcohol abuse Brother    Other Neg Hx        cad,prostate ca, colon ca   Coronary artery disease Neg Hx    Cancer Neg Hx        colon, prostate   Colon cancer Neg Hx    Esophageal cancer Neg Hx    Rectal cancer Neg Hx    Allergies  Allergen Reactions   Clopidogrel  Anaphylaxis    Weird sensation in legs     ROS    Objective:    BP 120/82   Pulse 69   Temp 98.6 F (37 C)   Ht 5' 8 (1.727 m)   Wt 182 lb (82.6 kg)   SpO2 99%   BMI 27.67 kg/m  BP Readings from Last 3 Encounters:  03/21/24 120/82  11/23/23 122/73  11/10/23 122/75   Wt Readings from Last 3 Encounters:  03/21/24 182 lb (82.6 kg)  11/23/23 208 lb (94.3 kg)  11/10/23 210 lb (95.3 kg)    Physical Exam Vitals and nursing note reviewed.  Constitutional:      General: He is not in acute distress.    Appearance: Normal appearance.     Comments: Using 4 pronged cane  HENT:     Head: Normocephalic.     Right Ear: Tympanic membrane, ear canal and external ear normal.     Left Ear: Tympanic membrane, ear canal and external ear normal.  Eyes:     Extraocular Movements: Extraocular movements intact.     Conjunctiva/sclera: Conjunctivae normal.     Pupils: Pupils are equal, round, and reactive to light.  Cardiovascular:     Rate and Rhythm: Normal rate and regular rhythm.     Heart sounds: Normal heart sounds.  Pulmonary:     Effort: Pulmonary effort is normal.     Breath sounds: Normal breath sounds.  Musculoskeletal:     Right lower leg: No edema.     Left lower leg: No edema.  Skin:    Findings: Erythema and rash present.  Comments: Lower legs - diffuse erythematous scaly rash over  legs.   Neurological:     General: No focal deficit present.     Mental Status: He is alert and oriented to person, place, and time.  Psychiatric:        Attention and Perception: Attention normal.        Mood and Affect: Mood is depressed.        Speech: Speech normal.        Behavior: Behavior normal.        Thought Content: Thought content normal.      No results found for any visits on 03/21/24.

## 2024-03-22 LAB — HEMOGLOBIN A1C
Hgb A1c MFr Bld: 6.6 % — ABNORMAL HIGH (ref <5.7)
Mean Plasma Glucose: 143 mg/dL
eAG (mmol/L): 7.9 mmol/L

## 2024-03-22 LAB — LIPID PANEL
Cholesterol: 129 mg/dL (ref <200)
HDL: 42 mg/dL (ref >=40)
LDL Cholesterol (Calc): 66 mg/dL
Non-HDL Cholesterol (Calc): 87 mg/dL (ref <130)
Total CHOL/HDL Ratio: 3.1 (calc) (ref <5.0)
Triglycerides: 129 mg/dL (ref <150)

## 2024-03-22 LAB — IRON,TIBC AND FERRITIN PANEL
%SAT: 30 % (ref 20–48)
Ferritin: 78 ng/mL (ref 24–380)
Iron: 93 ug/dL (ref 50–180)
TIBC: 314 ug/dL (ref 250–425)

## 2024-03-22 LAB — TSH: TSH: 2.18 m[IU]/L (ref 0.40–4.50)

## 2024-03-22 LAB — VITAMIN D 25 HYDROXY (VIT D DEFICIENCY, FRACTURES): Vit D, 25-Hydroxy: 54 ng/mL (ref 30–100)

## 2024-03-23 ENCOUNTER — Ambulatory Visit: Payer: Self-pay | Admitting: Family Medicine

## 2024-03-26 ENCOUNTER — Ambulatory Visit (INDEPENDENT_AMBULATORY_CARE_PROVIDER_SITE_OTHER)

## 2024-03-26 DIAGNOSIS — I639 Cerebral infarction, unspecified: Secondary | ICD-10-CM | POA: Diagnosis not present

## 2024-03-26 LAB — CUP PACEART REMOTE DEVICE CHECK
Date Time Interrogation Session: 20250906231527
Implantable Pulse Generator Implant Date: 20220405

## 2024-04-01 ENCOUNTER — Ambulatory Visit: Payer: Self-pay | Admitting: Cardiology

## 2024-04-05 NOTE — Progress Notes (Signed)
 Remote Loop Recorder Transmission

## 2024-04-09 ENCOUNTER — Ambulatory Visit: Admitting: Family Medicine

## 2024-04-10 ENCOUNTER — Ambulatory Visit: Attending: Cardiovascular Disease | Admitting: Cardiovascular Disease

## 2024-04-10 VITALS — BP 100/60 | HR 90 | Ht 68.0 in | Wt 179.2 lb

## 2024-04-10 DIAGNOSIS — I2510 Atherosclerotic heart disease of native coronary artery without angina pectoris: Secondary | ICD-10-CM | POA: Insufficient documentation

## 2024-04-10 DIAGNOSIS — I10 Essential (primary) hypertension: Secondary | ICD-10-CM | POA: Diagnosis not present

## 2024-04-10 DIAGNOSIS — E785 Hyperlipidemia, unspecified: Secondary | ICD-10-CM | POA: Diagnosis not present

## 2024-04-10 DIAGNOSIS — I5022 Chronic systolic (congestive) heart failure: Secondary | ICD-10-CM | POA: Insufficient documentation

## 2024-04-10 NOTE — Progress Notes (Signed)
 Cardiology Office Note   Date:  04/10/2024   ID:  [name], DOB [birth year], MRN [id number]  PCP:  Frank Bene, MD  Cardiologist:   Frank Cage, MD   Chief Complaint  Patient presents with   Follow-up    3-4 Month f/u discuss Brilinta  pt d/c 09/2023 due to surgery and never started medication again. Meds reviewed verbally with pt.      History of Present Illness: [name] is a 63 y.o. male who presents for for follow-up visit regarding coronary artery disease and chronic systolic heart failure due to ischemic cardiomyopathy.  He has known history of diabetes mellitus, essential hypertension, CVA ,hyperlipidemia and previous tobacco use. He presented in April of 2021 with anterior ST elevation myocardial infarction.  Emergent cardiac catheterization showed occluded proximal LAD which was treated successfully with PCI and drug-eluting stent placement.  Echocardiogram showed an EF of 35 to 40%.   He quit smoking since his cardiac event. He had myalgia with atorvastatin  but improved after switching to rosuvastatin .  He had compliance issues.  He was hospitalized in April 2022 with acute CVA in the setting of noncompliance with antihypertensive medications.  Imaging showed left posterior thalamic punctuate infarct.  He had a loop recorder inserted.  Transesophageal echocardiogram showed no cardiac source of embolism.  EF was 55 to 60%.  He had recurrent angina in early 2025.  Echocardiogram showed an EF of 40 to 45%.  Cardiac catheterization showed significant in-stent restenosis in the LAD involving a large diagonal branch.  He subsequently underwent two-vessel CABG. He lost his job after the surgery.  He has not been having any chest pain or shortness of breath.  He continues to lose weight with Ozempic . He reports increased symptoms of imbalance, memory issues and difficulty with concentration.  Past Medical History:  Diagnosis Date   Anemia 10/03/2009   Qualifier:  Diagnosis of  By: [name]    Anxiety associated with depression 10/11/2013   pt states he is no longer having this issue because he is happy with his job now   Colon cancer [facility])    right colon cancer- adenocarcinoma CEA level isnrmal at 1.8   Coronary artery disease    Diabetes mellitus    Diabetes mellitus type 2 in obese 03/20/2010   Qualifier: Diagnosis of  By: [name]     Erectile dysfunction 06/22/2016   Essential hypertension 06/05/2014   FATTY LIVER DISEASE 11/04/2009   Qualifier: Diagnosis of  By: [name]   Qualifier: Diagnosis of  By: [name]  Last Assessment & Plan:  conirmed by CT scan of abdomen in April 2016 encouraged to minimize simple carbs and fatty foods.   GERD 11/04/2009   Qualifier: Diagnosis of  By: [name]    Great toe pain, right 04/18/2017   Hepatic artery stenosis    History of colon cancer 01/02/2010   Qualifier: Diagnosis of  By: [name]  Last Assessment & Plan:  Follows closely with gastroenterology, Dr Frank   Hyperlipidemia    Hyperlipidemia, mixed 09/07/2010   Qualifier: Diagnosis of  By: [name]    Hypertension    Hypertriglyceridemia 12/14/2010   Hypotestosteronism 06/22/2016   INSOMNIA, CHRONIC 03/20/2010   Qualifier: Diagnosis of  By: [name]  10 mg daily does not keep asleep AdvilPM is over sedating and has trouble waking up  Iron  deficiency anemia 2011   Low testosterone  06/22/2016   Malignant neoplasm of colon (HCC) 01/02/2010   Qualifier: Diagnosis of  By: [name]    Muscle spasm of back 06/05/2014   Nipple pain 09/30/2016   Obesity    Palpitations 06/05/2014   Preventative health care 10/08/2012   Psoriasis    Psoriatic arthritis (HCC) 08/25/2017   Right knee pain 11/17/2011   Stroke [facility])     Past Surgical History:  Procedure Laterality Date   BUBBLE STUDY  10/21/2020   Procedure: BUBBLE STUDY;  Surgeon: Frank Vinie BROCKS, MD;  [facility];  Service: Cardiovascular;;   CARPAL TUNNEL RELEASE     RIGHT   CHOLECYSTECTOMY  1994   COLONOSCOPY     CORONARY ARTERY BYPASS GRAFT N/A 09/19/2023   Procedure: CORONARY ARTERY BYPASS GRAFTING (CABG) TIMES TWO USING LEFT INTERNAL MAMMARY ARTERY AND ENDOSCOPICALLY HARVESTED RIGHT GREATER SAPHENOUS VEIN;  Surgeon: Frank Dorise POUR, MD;  Location: [facility] OR;  Service: Open Heart Surgery;  Laterality: N/A;   CORONARY/GRAFT ACUTE MI REVASCULARIZATION N/A 10/27/2019   Procedure: Coronary/Graft Acute MI Revascularization;  Surgeon: Frank Frank LABOR, MD;  Location: [facility] INVASIVE CV LAB;  Service: Cardiovascular;  Laterality: N/A;   HEMICOLECTOMY     12/17/2009 right   LEFT HEART CATH AND CORONARY ANGIOGRAPHY N/A 10/27/2019   Procedure: LEFT HEART CATH AND CORONARY ANGIOGRAPHY;  Surgeon: Frank Frank LABOR, MD;  Location: [facility] INVASIVE CV LAB;  Service: Cardiovascular;  Laterality: N/A;   LOOP RECORDER INSERTION N/A 10/21/2020   Procedure: LOOP RECORDER INSERTION;  Surgeon: Frank Agent, MD;  [facility] INVASIVE CV LAB;  Service: Cardiovascular;  Laterality: N/A;   POLYPECTOMY     RIGHT/LEFT HEART CATH AND CORONARY ANGIOGRAPHY Bilateral 08/12/2023   Procedure: RIGHT/LEFT HEART CATH AND CORONARY ANGIOGRAPHY;  Surgeon: Frank Frank LABOR, MD;  Location: [facility] INVASIVE CV LAB;  Service: Cardiovascular;  Laterality: Bilateral;   TEE WITHOUT CARDIOVERSION N/A 10/21/2020   Procedure: TRANSESOPHAGEAL ECHOCARDIOGRAM (TEE);  Surgeon: Frank Vinie BROCKS, MD;  Location: [facility] ENDOSCOPY;  Service: Cardiovascular;  Laterality: N/A;   TEE WITHOUT CARDIOVERSION N/A 09/19/2023   Procedure: TRANSESOPHAGEAL ECHOCARDIOGRAM (TEE);  Surgeon: Frank Dorise POUR, MD;  [facility] OR;  Service: Open Heart Surgery;  Laterality: N/A;     Current Outpatient Medications  Medication Sig Dispense Refill   Adalimumab-ryvk, 2 Pen, 40 MG/0.4ML AJKT Inject into the skin. (Patient taking differently: Inject into the skin  every 30 (thirty) days.)     Blood Glucose Monitoring Suppl (CONTOUR NEXT EZ) w/Device KIT 1 each by Does not apply route as directed. 1 kit 1   Continuous Glucose Sensor (FREESTYLE LIBRE 3 PLUS SENSOR) MISC Change sensor every 15 days. 2 each 6   diazepam  (VALIUM ) 2 MG tablet Take 1 tablet (2 mg total) by mouth daily as needed for anxiety. 30 tablet 0   glucose blood (CONTOUR NEXT TEST) test strip 1 each by Other route 4 (four) times daily - after meals and at bedtime. Use as instructed 100 each 12   Insulin  Glargine (BASAGLAR  KWIKPEN) 100 UNIT/ML Inject 5 Units into the skin at bedtime. Titrate to Fasting Blood Sugar of 100- 150 mg/dL with morning blood sugar check. 15 mL 3   insulin  lispro (HUMALOG ) 100 UNIT/ML KwikPen Only take 3 units with meals if BG is greater than 250 up to three times daily 15 mL 5   metFORMIN  (GLUCOPHAGE -XR) 750 MG 24 hr tablet Take 1 tablet (750 mg  total) by mouth 2 (two) times daily after a meal. (Patient taking differently: Take 750 mg by mouth as needed.) 180 tablet 3   metoprolol  tartrate (LOPRESSOR ) 50 MG tablet Take 1 tablet (50 mg total) by mouth 2 (two) times daily. 60 tablet 3   rosuvastatin  (CRESTOR ) 20 MG tablet Take 1 tablet (20 mg total) by mouth every evening. 90 tablet 3   Semaglutide , 2 MG/DOSE, 8 MG/3ML SOPN Inject 2 mg as directed once a week. 3 mL 11   zolpidem  (Duke ) 10 MG tablet Take 1 tablet (10 mg total) by mouth at bedtime as needed. 30 tablet 0   acetaminophen  (TYLENOL ) 325 MG tablet Take 2 tablets (650 mg total) by mouth every 6 (six) hours as needed. (Patient not taking: Reported on 04/10/2024)     aspirin  EC 325 MG tablet Take 1 tablet (325 mg total) by mouth daily. (Patient not taking: Reported on 04/10/2024)     escitalopram  (LEXAPRO ) 10 MG tablet Take 1 tablet (10 mg total) by mouth daily. (Patient not taking: Reported on 04/10/2024) 90 tablet 1   isosorbide  mononitrate (IMDUR ) 30 MG 24 hr tablet Take 0.5 tablets (15 mg total) by mouth daily.  (Patient not taking: Reported on 04/10/2024) 45 tablet 3   lisinopril  (ZESTRIL ) 20 MG tablet Take 20 mg by mouth daily. (Patient not taking: Reported on 04/10/2024)     No current facility-administered medications for this visit.    Allergies:   Clopidogrel     Social History:  The patient  reports that he quit smoking about 8 months ago. His smoking use included cigarettes. He has never used smokeless tobacco. He reports that he does not drink alcohol and does not use drugs.   Family History:  The patient's family history includes Alcohol abuse in his brother; Diabetes in his brother; Diabetes type II in his brother; Stomach cancer in his mother.    ROS:  Please see the history of present illness.   Otherwise, review of systems are positive for none.   All other systems are reviewed and negative.    PHYSICAL EXAM: VS:  BP 100/60 (BP Location: Left Arm, Patient Position: Sitting, Cuff Size: Normal)   Pulse 90   Ht 5' 8 (1.727 m)   Wt 179 lb 4 oz (81.3 kg)   SpO2 99%   BMI 27.25 kg/m  , BMI Body mass index is 27.25 kg/m. GEN: Well nourished, well developed, in no acute distress  HEENT: normal  Neck: no JVD, carotid bruits, or masses Cardiac: RRR; no murmurs, rubs, or gallops,no edema  Respiratory:  clear to auscultation bilaterally, normal work of breathing GI: soft, nontender, nondistended, + BS MS: no deformity or atrophy  Skin: warm and dry, no rash Neuro:  Strength and sensation are intact Psych: euthymic mood, full affect    EKG:  EKG is ordered today. The ekg ordered today demonstrates: Normal sinus rhythm Anteroseptal infarct (cited on or before 17-Mar-2022) When compared with ECG of 10-Oct-2023 09:02, No significant change was found    Recent Labs: 09/15/2023: ALT 18 09/20/2023: Magnesium  2.2 09/21/2023: BUN 11; Creatinine, Ser 1.03; Hemoglobin 11.7; Platelets 141; Potassium 4.0; Sodium 138 03/21/2024: TSH 2.18    Lipid Panel    Component Value Date/Time   CHOL  129 03/21/2024 1105   CHOL 111 05/20/2023 1040   CHOL 949 (H) 10/27/2014 1322   TRIG 129 03/21/2024 1105   TRIG 1,098 (H) 10/30/2014 0605   HDL 42 03/21/2024 1105   HDL 39 (L) 05/20/2023  1040   HDL 20 (L) 10/27/2014 1322   CHOLHDL 3.1 03/21/2024 1105   VLDL 51 (H) 10/19/2020 0116   VLDL SEE COMMENT 10/27/2014 1322   LDLCALC 66 03/21/2024 1105   LDLCALC SEE COMMENT 10/27/2014 1322   LDLDIRECT 76.0 03/13/2021 1317      Wt Readings from Last 3 Encounters:  04/10/24 179 lb 4 oz (81.3 kg)  03/21/24 182 lb (82.6 kg)  11/23/23 208 lb (94.3 kg)           12/07/2017    8:16 AM  PAD Screen  Previous PAD dx? No  Previous surgical procedure? No  Pain with walking? No  Feet/toe relief with dangling? Yes  Painful, non-healing ulcers? No  Extremities discolored? No      ASSESSMENT AND PLAN:  1.  Coronary artery disease involving native coronary arteries without angina: He is doing very well post CABG.  Continue aspirin  325 mg once daily.  Continue treatment of risk factors.    2.  Chronic systolic heart failure due to ischemic cardiomyopathy: EF of 40 to 45%.  His blood pressure has been running low and thus we will keep him off all antihypertensive medications for now.  No evidence of volume overload.    3.  Essential hypertension: He has not been taking metoprolol , lisinopril  or Imdur  and his blood pressure is on the low side.  This is likely due to continued weight loss.  We will not resume at this time.  4.  Hyperlipidemia: Continue treatment with rosuvastatin .  Most recent lipid profile showed an LDL of 66.  5.  Status post stroke with symptoms of imbalance, memory issues and difficulty with concentration.  I suggested that he follows with neurology again.   Disposition:   FU with me in 4 months.  Signed,  Frank Cage, MD  04/10/2024 4:15 PM    [facility]

## 2024-04-10 NOTE — Patient Instructions (Signed)
 Medication Instructions:  RESUME the Aspirin  325 mg once daily  *If you need a refill on your cardiac medications before your next appointment, please call your pharmacy*  Lab Work: None ordered If you have labs (blood work) drawn today and your tests are completely normal, you will receive your results only by: MyChart Message (if you have MyChart) OR A paper copy in the mail If you have any lab test that is abnormal or we need to change your treatment, we will call you to review the results.  Testing/Procedures: None ordered  Follow-Up: At [facility], you and your health needs are our priority.  As part of our continuing mission to provide you with exceptional heart care, our providers are all part of one team.  This team includes your primary Cardiologist (physician) and Advanced Practice Providers or APPs (Physician Assistants and Nurse Practitioners) who all work together to provide you with the care you need, when you need it.  Your next appointment:   4 month(s)  Provider:   You may see Deatrice Cage, MD or one of the following Advanced Practice Providers on your designated Care Team:   Lonni Meager, NP Lesley Maffucci, PA-C Bernardino Bring, PA-C Cadence [place], PA-C Tylene Lunch, NP Barnie Hila, NP    We recommend signing up for the patient portal called MyChart.  Sign up information is provided on this After Visit Summary.  MyChart is used to connect with patients for Virtual Visits (Telemedicine).  Patients are able to view lab/test results, encounter notes, upcoming appointments, etc.  Non-urgent messages can be sent to your provider as well.   To learn more about what you can do with MyChart, go to [email or web address].

## 2024-04-11 ENCOUNTER — Other Ambulatory Visit: Payer: Self-pay

## 2024-04-11 ENCOUNTER — Encounter: Payer: Self-pay | Admitting: Family Medicine

## 2024-04-11 DIAGNOSIS — Z8673 Personal history of transient ischemic attack (TIA), and cerebral infarction without residual deficits: Secondary | ICD-10-CM

## 2024-04-14 NOTE — Progress Notes (Signed)
 Remote Loop Recorder Transmission

## 2024-04-18 ENCOUNTER — Ambulatory Visit (INDEPENDENT_AMBULATORY_CARE_PROVIDER_SITE_OTHER): Admitting: Family Medicine

## 2024-04-18 ENCOUNTER — Encounter: Payer: Self-pay | Admitting: Family Medicine

## 2024-04-18 VITALS — BP 124/62 | HR 97 | Temp 97.8°F | Ht 68.0 in | Wt 181.0 lb

## 2024-04-18 DIAGNOSIS — I152 Hypertension secondary to endocrine disorders: Secondary | ICD-10-CM | POA: Diagnosis not present

## 2024-04-18 DIAGNOSIS — E1169 Type 2 diabetes mellitus with other specified complication: Secondary | ICD-10-CM | POA: Diagnosis not present

## 2024-04-18 DIAGNOSIS — E1159 Type 2 diabetes mellitus with other circulatory complications: Secondary | ICD-10-CM | POA: Diagnosis not present

## 2024-04-18 DIAGNOSIS — E785 Hyperlipidemia, unspecified: Secondary | ICD-10-CM | POA: Diagnosis not present

## 2024-04-18 DIAGNOSIS — D509 Iron deficiency anemia, unspecified: Secondary | ICD-10-CM | POA: Diagnosis not present

## 2024-04-18 DIAGNOSIS — F5104 Psychophysiologic insomnia: Secondary | ICD-10-CM | POA: Diagnosis not present

## 2024-04-18 DIAGNOSIS — F321 Major depressive disorder, single episode, moderate: Secondary | ICD-10-CM

## 2024-04-18 MED ORDER — ESCITALOPRAM OXALATE 10 MG PO TABS: 10.0000 mg | ORAL_TABLET | Freq: Every day | ORAL | 1 refills | Status: DC

## 2024-04-18 NOTE — Progress Notes (Signed)
 Patient Office Visit  Assessment & Plan:  Hyperlipidemia associated with type 2 diabetes mellitus (HCC)  Depression, major, single episode, moderate (HCC) -     Escitalopram  Oxalate; Take 1 tablet (10 mg total) by mouth daily.  Dispense: 90 tablet; Refill: 1  Hypertension associated with diabetes (HCC)  Psychophysiological insomnia  Iron  deficiency anemia, unspecified iron  deficiency anemia type   Assessment and Plan    Impaired balance with recurrent falls Worsening balance with recurrent falls. Neurology referral scheduled for January 5th. - Encourage use of cane for stability. - Consider physical therapy until neurology appointment.  Major depressive disorder, single episode Mood unchanged on escitalopram  10 mg. Discussed potential dose adjustment, reassessment at next visit. - Continue escitalopram  10 mg daily. - Reassess mood and medication efficacy at next visit.  Iron  deficiency Iron  levels low normal. Discussed importance of supplementation and absorption methods. - Recommend over-the-counter ferrous sulfate supplementation. - Advise taking OTC Ferrous sulfate- iron  with vitamin C to enhance absorption.  Insomnia Intermittent use of Ambien  5 mg, three times a week. Discussed long-term use risks, including dementia. - Continue using Ambien  5 mg as needed, not exceeding three times a week.     Patient will consider getting pneumococcal vaccine next office visit. Return in about 3 months (around 07/19/2024), or if symptoms worsen or fail to improve.   Subjective:    Patient ID: [name], male    DOB: Dec 09, 1960  Age: 63 y.o. MRN: [id number]  Chief Complaint  Patient presents with   Medical Management of Chronic Issues    HPI Discussed the use of AI scribe software for clinical note transcription with the patient, who gave verbal consent to proceed.  History of Present Illness        [name] is a 63 year old male with a history of stroke  and heart attack who presents with worsening balance issues and frequent falls. Also here for follow up on depression  He has been experiencing worsening balance issues and frequent falls, which he attributes to his past stroke or heart attack. His balance has been significantly impaired, leading to more frequent falls, including a recent incident where he fell in the hallway while trying to avoid stepping on his cats, though he did not hit his head. He is on a waiting list to see a neurologist, with an appointment scheduled for January 5th. patient does not want to do PT at this time while waiting for Neurology  He is currently taking Lexapro  (escitalopram ) 10 mg for depression, which he started about three weeks ago. His mood remains unchanged, and he is uncertain if the medication is effective. He also takes Ambien  (zolpidem ) 5 mg as needed for sleep, approximately three times a week, and reports waking up not feeling groggy. He reports getting about eight hours of sleep per night.  He is applying for social security disability due to his health issues. He lives alone and uses a cane primarily for support in case of falls, rather than for walking stability.  No leg swelling. No new medications prescribed by the cardiologist. Physical Exam MEASUREMENTS: Weight- 181. Results LABS Iron : decreased (03/21/2024) Assessment & Plan Impaired balance with recurrent falls Worsening balance with recurrent falls. Neurology referral scheduled for January 5th. - Encourage use of cane for stability. - Consider physical therapy until neurology appointment.  Major depressive disorder, single episode Mood unchanged on escitalopram  10 mg. Discussed potential dose adjustment, reassessment at next visit. - Continue escitalopram  10 mg daily. -  Reassess mood and medication efficacy at next visit.  Iron  deficiency Iron  levels low normal. Discussed importance of supplementation and absorption methods. -  Recommend over-the-counter ferrous sulfate supplementation. - Advise taking OTC Ferrous sulfate- iron  with vitamin C to enhance absorption.  Insomnia Intermittent use of Ambien  5 mg, three times a week. Discussed long-term use risks, including dementia. - Continue using Ambien  5 mg as needed, not exceeding three times a week.    The ASCVD Risk score (Arnett DK, et al., 2019) failed to calculate for the following reasons:   Risk score cannot be calculated because patient has a medical history suggesting prior/existing ASCVD  Past Medical History:  Diagnosis Date   Anemia 10/03/2009   Qualifier: Diagnosis of  By: Georgian ROSALEA CHARM Lamar    Anxiety associated with depression 10/11/2013   pt states he is no longer having this issue because he is happy with his job now   Colon cancer [facility])    right colon cancer- adenocarcinoma CEA level isnrmal at 1.8   Coronary artery disease    Diabetes mellitus    Diabetes mellitus type 2 in obese 03/20/2010   Qualifier: Diagnosis of  By: Georgian ROSALEA CHARM Lamar     Erectile dysfunction 06/22/2016   Essential hypertension 06/05/2014   FATTY LIVER DISEASE 11/04/2009   Qualifier: Diagnosis of  By: Abran MD, Norleen SAILOR   Qualifier: Diagnosis of  By: Abran MD, Norleen SAILOR  Last Assessment & Plan:  conirmed by CT scan of abdomen in April 2016 encouraged to minimize simple carbs and fatty foods.   GERD 11/04/2009   Qualifier: Diagnosis of  By: Abran MD, Norleen SAILOR    Great toe pain, right 04/18/2017   Hepatic artery stenosis    History of colon cancer 01/02/2010   Qualifier: Diagnosis of  By: Georgian ROSALEA CHARM Lamar Dr Abran  Last Assessment & Plan:  Follows closely with gastroenterology, Dr Abran   Hyperlipidemia    Hyperlipidemia, mixed 09/07/2010   Qualifier: Diagnosis of  By: Georgian ROSALEA CHARM Lamar    Hypertension    Hypertriglyceridemia 12/14/2010   Hypotestosteronism 06/22/2016   INSOMNIA, CHRONIC 03/20/2010   Qualifier: Diagnosis of  By: Georgian ROSALEA CHARM Lamar Ambien  10 mg daily does  not keep asleep AdvilPM is over sedating and has trouble waking up    Iron  deficiency anemia 2011   Low testosterone  06/22/2016   Malignant neoplasm of colon (HCC) 01/02/2010   Qualifier: Diagnosis of  By: Georgian ROSALEA CHARM Lamar Dr Abran    Muscle spasm of back 06/05/2014   Nipple pain 09/30/2016   Obesity    Palpitations 06/05/2014   Preventative health care 10/08/2012   Psoriasis    Psoriatic arthritis (HCC) 08/25/2017   Right knee pain 11/17/2011   Stroke [facility])    Past Surgical History:  Procedure Laterality Date   BUBBLE STUDY  10/21/2020   Procedure: BUBBLE STUDY;  Surgeon: Mona Vinie BROCKS, MD;  [facility] ENDOSCOPY;  Service: Cardiovascular;;   CARPAL TUNNEL RELEASE     RIGHT   CHOLECYSTECTOMY  1994   COLONOSCOPY     CORONARY ARTERY BYPASS GRAFT N/A 09/19/2023   Procedure: CORONARY ARTERY BYPASS GRAFTING (CABG) TIMES TWO USING LEFT INTERNAL MAMMARY ARTERY AND ENDOSCOPICALLY HARVESTED RIGHT GREATER SAPHENOUS VEIN;  Surgeon: Lucas Dorise POUR, MD;  Location: [facility] OR;  Service: Open Heart Surgery;  Laterality: N/A;   CORONARY/GRAFT ACUTE MI REVASCULARIZATION N/A 10/27/2019   Procedure: Coronary/Graft Acute MI Revascularization;  Surgeon: Darron Deatrice LABOR, MD;  Location: [facility] INVASIVE CV LAB;  Service: Cardiovascular;  Laterality: N/A;   HEMICOLECTOMY     12/17/2009 right   LEFT HEART CATH AND CORONARY ANGIOGRAPHY N/A 10/27/2019   Procedure: LEFT HEART CATH AND CORONARY ANGIOGRAPHY;  Surgeon: Darron Deatrice LABOR, MD;  Location: [facility] INVASIVE CV LAB;  Service: Cardiovascular;  Laterality: N/A;   LOOP RECORDER INSERTION N/A 10/21/2020   Procedure: LOOP RECORDER INSERTION;  Surgeon: Kelsie Agent, MD;  [facility] INVASIVE CV LAB;  Service: Cardiovascular;  Laterality: N/A;   POLYPECTOMY     RIGHT/LEFT HEART CATH AND CORONARY ANGIOGRAPHY Bilateral 08/12/2023   Procedure: RIGHT/LEFT HEART CATH AND CORONARY ANGIOGRAPHY;  Surgeon: Darron Deatrice LABOR, MD;  Location: [facility] INVASIVE CV LAB;  Service:  Cardiovascular;  Laterality: Bilateral;   TEE WITHOUT CARDIOVERSION N/A 10/21/2020   Procedure: TRANSESOPHAGEAL ECHOCARDIOGRAM (TEE);  Surgeon: Mona Vinie BROCKS, MD;  Location: [facility] ENDOSCOPY;  Service: Cardiovascular;  Laterality: N/A;   TEE WITHOUT CARDIOVERSION N/A 09/19/2023   Procedure: TRANSESOPHAGEAL ECHOCARDIOGRAM (TEE);  Surgeon: Lucas Dorise POUR, MD;  Location: [facility] OR;  Service: Open Heart Surgery;  Laterality: N/A;   Social History   Tobacco Use   Smoking status: Former    Current packs/day: 0.00    Types: Cigarettes    Quit date: 07/16/2023    Years since quitting: 0.7   Smokeless tobacco: Never   Tobacco comments:    Smokes 1 Pack per week; started back 3 months ago  Vaping Use   Vaping status: Never Used  Substance Use Topics   Alcohol use: No   Drug use: No   Family History  Problem Relation Age of Onset   Stomach cancer Mother        diedinher 75's   Diabetes type II Brother        boderline   Diabetes Brother        type II   Alcohol abuse Brother    Other Neg Hx        cad,prostate ca, colon ca   Coronary artery disease Neg Hx    Cancer Neg Hx        colon, prostate   Colon cancer Neg Hx    Esophageal cancer Neg Hx    Rectal cancer Neg Hx    Allergies  Allergen Reactions   Clopidogrel  Anaphylaxis    Weird sensation in legs     ROS    Objective:    BP 124/62   Pulse 97   Temp 97.8 F (36.6 C)   Ht 5' 8 (1.727 m)   Wt 181 lb (82.1 kg)   SpO2 99%   BMI 27.52 kg/m  BP Readings from Last 3 Encounters:  04/18/24 124/62  04/10/24 100/60  03/21/24 120/82   Wt Readings from Last 3 Encounters:  04/18/24 181 lb (82.1 kg)  04/10/24 179 lb 4 oz (81.3 kg)  03/21/24 182 lb (82.6 kg)    Physical Exam Vitals and nursing note reviewed.  Constitutional:      Appearance: Normal appearance.  HENT:     Head: Normocephalic.     Right Ear: Tympanic membrane, ear canal and external ear normal.     Left Ear: Tympanic membrane, ear canal and external  ear normal.  Eyes:     Extraocular Movements: Extraocular movements intact.     Conjunctiva/sclera: Conjunctivae normal.     Pupils: Pupils are equal, round, and reactive to light.  Cardiovascular:     Rate and Rhythm: Normal rate and regular rhythm.  Heart sounds: Normal heart sounds.  Pulmonary:     Effort: Pulmonary effort is normal.     Breath sounds: Normal breath sounds.  Musculoskeletal:     Right lower leg: No edema.     Left lower leg: No edema.  Neurological:     General: No focal deficit present.     Mental Status: He is alert and oriented to person, place, and time.  Psychiatric:        Mood and Affect: Mood normal.        Behavior: Behavior normal.        Thought Content: Thought content normal.        Judgment: Judgment normal.      No results found for any visits on 04/18/24.

## 2024-04-20 ENCOUNTER — Other Ambulatory Visit (HOSPITAL_COMMUNITY): Payer: Self-pay

## 2024-04-24 ENCOUNTER — Ambulatory Visit: Admitting: Cardiovascular Disease

## 2024-04-26 ENCOUNTER — Encounter

## 2024-04-27 ENCOUNTER — Ambulatory Visit: Payer: Self-pay

## 2024-04-27 DIAGNOSIS — I639 Cerebral infarction, unspecified: Secondary | ICD-10-CM | POA: Diagnosis not present

## 2024-04-27 LAB — CUP PACEART REMOTE DEVICE CHECK
Date Time Interrogation Session: 20251009230804
Implantable Pulse Generator Implant Date: 20220405

## 2024-04-30 ENCOUNTER — Ambulatory Visit: Payer: Self-pay | Admitting: Cardiology

## 2024-04-30 NOTE — Progress Notes (Signed)
 Remote Loop Recorder Transmission

## 2024-05-03 ENCOUNTER — Other Ambulatory Visit (HOSPITAL_COMMUNITY): Payer: Self-pay

## 2024-05-07 ENCOUNTER — Telehealth: Payer: Self-pay

## 2024-05-07 ENCOUNTER — Other Ambulatory Visit (HOSPITAL_COMMUNITY): Payer: Self-pay

## 2024-05-07 NOTE — Telephone Encounter (Signed)
 Pharmacy Patient Advocate Encounter   Received notification from Onbase that prior authorization for FreeStyle Libre 3 Plus Sensor  is required/requested.   Insurance verification completed.   The patient is insured through [facility] MEDICAID.   Per test claim: PA required; PA submitted to above mentioned insurance via Latent Key/confirmation #/EOC [facility] Status is pending

## 2024-05-07 NOTE — Telephone Encounter (Signed)
 Pharmacy Patient Advocate Encounter  Received notification from [facility] MEDICAID that Prior Authorization for FreeStyle Libre 3 Plus Sensor  has been APPROVED from 05/07/24 to 05/07/25. Ran test claim, Copay is $0. This test claim was processed through [facility]- copay amounts may vary at other pharmacies due to pharmacy/plan contracts, or as the patient moves through the different stages of their insurance plan.   PA #/Case ID/Reference #: [id number]

## 2024-05-09 ENCOUNTER — Ambulatory Visit: Admitting: Family Medicine

## 2024-05-11 ENCOUNTER — Other Ambulatory Visit: Payer: Self-pay

## 2024-05-11 ENCOUNTER — Telehealth: Payer: Self-pay | Admitting: Family Medicine

## 2024-05-11 DIAGNOSIS — E1139 Type 2 diabetes mellitus with other diabetic ophthalmic complication: Secondary | ICD-10-CM

## 2024-05-11 MED ORDER — FREESTYLE LIBRE 3 PLUS SENSOR MISC: 6 refills | Status: AC

## 2024-05-11 NOTE — Telephone Encounter (Signed)
 Patient came to the office to advise provider his pharmacy needs a script for a refill of  Continuous Glucose Sensor (FREESTYLE LIBRE 3 PLUS SENSOR) MISC  Patient also stated his insurance requires a prior auth for this med.   Pharmacy confirmed as:   [id number] [place], [place] - [address] AT [facility] OF [address] [place], [place] [address] Phone: [phone]  Fax: [phone] DEA #: [id number]   Patient is currently out of this script.   Please advise patient at [phone].

## 2024-05-11 NOTE — Telephone Encounter (Signed)
 Sent in medication

## 2024-05-14 ENCOUNTER — Telehealth: Payer: Self-pay | Admitting: Neurology

## 2024-05-14 NOTE — Telephone Encounter (Signed)
 Pt called and accepted an earlier appointment with wait list

## 2024-05-28 ENCOUNTER — Ambulatory Visit: Payer: Self-pay

## 2024-05-28 ENCOUNTER — Encounter

## 2024-05-28 DIAGNOSIS — I639 Cerebral infarction, unspecified: Secondary | ICD-10-CM

## 2024-05-29 LAB — CUP PACEART REMOTE DEVICE CHECK
Date Time Interrogation Session: 20251109230746
Implantable Pulse Generator Implant Date: 20220405

## 2024-05-31 NOTE — Progress Notes (Signed)
 Remote Loop Recorder Transmission

## 2024-06-02 ENCOUNTER — Ambulatory Visit: Payer: Self-pay | Admitting: Cardiology

## 2024-06-21 ENCOUNTER — Observation Stay

## 2024-06-21 ENCOUNTER — Observation Stay
Admission: EM | Admit: 2024-06-21 | Discharge: 2024-06-22 | Disposition: A | Attending: Internal Medicine | Admitting: Internal Medicine

## 2024-06-21 ENCOUNTER — Other Ambulatory Visit: Payer: Self-pay

## 2024-06-21 ENCOUNTER — Emergency Department

## 2024-06-21 DIAGNOSIS — E119 Type 2 diabetes mellitus without complications: Secondary | ICD-10-CM | POA: Diagnosis not present

## 2024-06-21 DIAGNOSIS — D509 Iron deficiency anemia, unspecified: Secondary | ICD-10-CM | POA: Insufficient documentation

## 2024-06-21 DIAGNOSIS — I639 Cerebral infarction, unspecified: Principal | ICD-10-CM | POA: Diagnosis present

## 2024-06-21 DIAGNOSIS — Z959 Presence of cardiac and vascular implant and graft, unspecified: Secondary | ICD-10-CM | POA: Insufficient documentation

## 2024-06-21 DIAGNOSIS — L4050 Arthropathic psoriasis, unspecified: Secondary | ICD-10-CM | POA: Insufficient documentation

## 2024-06-21 DIAGNOSIS — R29707 NIHSS score 7: Secondary | ICD-10-CM | POA: Diagnosis not present

## 2024-06-21 DIAGNOSIS — I152 Hypertension secondary to endocrine disorders: Secondary | ICD-10-CM | POA: Diagnosis present

## 2024-06-21 DIAGNOSIS — I2510 Atherosclerotic heart disease of native coronary artery without angina pectoris: Secondary | ICD-10-CM | POA: Insufficient documentation

## 2024-06-21 DIAGNOSIS — Z955 Presence of coronary angioplasty implant and graft: Secondary | ICD-10-CM | POA: Diagnosis not present

## 2024-06-21 DIAGNOSIS — R262 Difficulty in walking, not elsewhere classified: Secondary | ICD-10-CM | POA: Diagnosis not present

## 2024-06-21 DIAGNOSIS — E1169 Type 2 diabetes mellitus with other specified complication: Secondary | ICD-10-CM | POA: Diagnosis present

## 2024-06-21 DIAGNOSIS — E785 Hyperlipidemia, unspecified: Secondary | ICD-10-CM | POA: Insufficient documentation

## 2024-06-21 DIAGNOSIS — I6523 Occlusion and stenosis of bilateral carotid arteries: Secondary | ICD-10-CM | POA: Diagnosis not present

## 2024-06-21 DIAGNOSIS — I10 Essential (primary) hypertension: Secondary | ICD-10-CM | POA: Diagnosis not present

## 2024-06-21 DIAGNOSIS — E1159 Type 2 diabetes mellitus with other circulatory complications: Secondary | ICD-10-CM | POA: Diagnosis present

## 2024-06-21 DIAGNOSIS — F39 Unspecified mood [affective] disorder: Secondary | ICD-10-CM | POA: Insufficient documentation

## 2024-06-21 DIAGNOSIS — I6782 Cerebral ischemia: Secondary | ICD-10-CM | POA: Diagnosis not present

## 2024-06-21 DIAGNOSIS — Z951 Presence of aortocoronary bypass graft: Secondary | ICD-10-CM

## 2024-06-21 DIAGNOSIS — E1139 Type 2 diabetes mellitus with other diabetic ophthalmic complication: Secondary | ICD-10-CM

## 2024-06-21 DIAGNOSIS — I6389 Other cerebral infarction: Principal | ICD-10-CM | POA: Insufficient documentation

## 2024-06-21 DIAGNOSIS — R29818 Other symptoms and signs involving the nervous system: Secondary | ICD-10-CM | POA: Diagnosis not present

## 2024-06-21 DIAGNOSIS — G4700 Insomnia, unspecified: Secondary | ICD-10-CM | POA: Insufficient documentation

## 2024-06-21 DIAGNOSIS — I6622 Occlusion and stenosis of left posterior cerebral artery: Secondary | ICD-10-CM | POA: Diagnosis not present

## 2024-06-21 LAB — DIFFERENTIAL
Abs Immature Granulocytes: 0.1 K/uL — ABNORMAL HIGH (ref 0.00–0.07)
Basophils Absolute: 0.1 K/uL (ref 0.0–0.1)
Basophils Relative: 0 %
Eosinophils Absolute: 0 K/uL (ref 0.0–0.5)
Eosinophils Relative: 0 %
Immature Granulocytes: 1 %
Lymphocytes Relative: 8 %
Lymphs Abs: 1.6 K/uL (ref 0.7–4.0)
Monocytes Absolute: 1.5 K/uL — ABNORMAL HIGH (ref 0.1–1.0)
Monocytes Relative: 8 %
Neutro Abs: 16.1 K/uL — ABNORMAL HIGH (ref 1.7–7.7)
Neutrophils Relative %: 83 %

## 2024-06-21 LAB — CBC
HCT: 42.4 % (ref 39.0–52.0)
Hemoglobin: 14.9 g/dL (ref 13.0–17.0)
MCH: 32.3 pg (ref 26.0–34.0)
MCHC: 35.1 g/dL (ref 30.0–36.0)
MCV: 92 fL (ref 80.0–100.0)
Platelets: 279 K/uL (ref 150–400)
RBC: 4.61 MIL/uL (ref 4.22–5.81)
RDW: 12.1 % (ref 11.5–15.5)
WBC: 19.3 K/uL — ABNORMAL HIGH (ref 4.0–10.5)
nRBC: 0 % (ref 0.0–0.2)

## 2024-06-21 LAB — COMPREHENSIVE METABOLIC PANEL WITH GFR
ALT: 21 U/L (ref 0–44)
AST: 97 U/L — ABNORMAL HIGH (ref 15–41)
Albumin: 4.2 g/dL (ref 3.5–5.0)
Alkaline Phosphatase: 54 U/L (ref 38–126)
Anion gap: 16 — ABNORMAL HIGH (ref 5–15)
BUN: 10 mg/dL (ref 8–23)
CO2: 21 mmol/L — ABNORMAL LOW (ref 22–32)
Calcium: 9.7 mg/dL (ref 8.9–10.3)
Chloride: 104 mmol/L (ref 98–111)
Creatinine, Ser: 1.14 mg/dL (ref 0.61–1.24)
GFR, Estimated: >60 mL/min (ref >60)
Glucose, Bld: 71 mg/dL (ref 70–99)
Potassium: 3.3 mmol/L — ABNORMAL LOW (ref 3.5–5.1)
Sodium: 140 mmol/L (ref 135–145)
Total Bilirubin: 1.3 mg/dL — ABNORMAL HIGH (ref 0.0–1.2)
Total Protein: 7.1 g/dL (ref 6.5–8.1)

## 2024-06-21 LAB — GLUCOSE, CAPILLARY: Glucose-Capillary: 135 mg/dL — ABNORMAL HIGH (ref 70–99)

## 2024-06-21 LAB — LIPID PANEL
Cholesterol: 162 mg/dL (ref 0–200)
HDL: 45 mg/dL (ref >40)
LDL Cholesterol: 96 mg/dL (ref 0–99)
Total CHOL/HDL Ratio: 3.6 ratio
Triglycerides: 108 mg/dL (ref <150)
VLDL: 22 mg/dL (ref 0–40)

## 2024-06-21 LAB — PROTIME-INR
INR: 1.1 (ref 0.8–1.2)
Prothrombin Time: 14.4 s (ref 11.4–15.2)

## 2024-06-21 LAB — CBG MONITORING, ED: Glucose-Capillary: 84 mg/dL (ref 70–99)

## 2024-06-21 LAB — APTT: aPTT: 26 s (ref 24–36)

## 2024-06-21 LAB — TROPONIN T, HIGH SENSITIVITY: Troponin T High Sensitivity: 50 ng/L — ABNORMAL HIGH (ref 0–19)

## 2024-06-21 MED ORDER — ASPIRIN 81 MG PO TBEC
81.0000 mg | DELAYED_RELEASE_TABLET | Freq: Every day | ORAL | Status: DC
  Administered 2024-06-21 – 2024-06-22 (×2): 81 mg via ORAL
  Filled 2024-06-21 (×2): qty 1

## 2024-06-21 MED ORDER — INSULIN ASPART 100 UNIT/ML IJ SOLN: 0.0000 [IU] | Freq: Every day | INTRAMUSCULAR | Status: DC

## 2024-06-21 MED ORDER — INSULIN ASPART 100 UNIT/ML IJ SOLN
0.0000 [IU] | Freq: Three times a day (TID) | INTRAMUSCULAR | Status: DC
  Administered 2024-06-22: 3 [IU] via SUBCUTANEOUS
  Filled 2024-06-21: qty 3

## 2024-06-21 MED ORDER — STROKE: EARLY STAGES OF RECOVERY BOOK: Freq: Once | Status: AC

## 2024-06-21 MED ORDER — TICAGRELOR 90 MG PO TABS
90.0000 mg | ORAL_TABLET | Freq: Two times a day (BID) | ORAL | Status: DC
  Administered 2024-06-21 – 2024-06-22 (×2): 90 mg via ORAL
  Filled 2024-06-21 (×2): qty 1

## 2024-06-21 MED ORDER — FERROUS SULFATE 325 (65 FE) MG PO TABS
325.0000 mg | ORAL_TABLET | Freq: Every day | ORAL | Status: DC
  Administered 2024-06-22: 325 mg via ORAL
  Filled 2024-06-21: qty 1

## 2024-06-21 MED ORDER — QUETIAPINE FUMARATE 25 MG PO TABS
100.0000 mg | ORAL_TABLET | Freq: Every day | ORAL | Status: DC
  Administered 2024-06-21: 100 mg via ORAL
  Filled 2024-06-21: qty 4

## 2024-06-21 MED ORDER — DIAZEPAM 5 MG/ML IJ SOLN
2.5000 mg | Freq: Once | INTRAMUSCULAR | Status: AC
  Administered 2024-06-21: 2.5 mg via INTRAVENOUS
  Filled 2024-06-21: qty 2

## 2024-06-21 MED ORDER — IOHEXOL 350 MG/ML SOLN
100.0000 mL | Freq: Once | INTRAVENOUS | Status: AC | PRN
  Administered 2024-06-21: 100 mL via INTRAVENOUS

## 2024-06-21 MED ORDER — ENOXAPARIN SODIUM 40 MG/0.4ML IJ SOSY
40.0000 mg | PREFILLED_SYRINGE | INTRAMUSCULAR | Status: DC
  Administered 2024-06-22: 40 mg via SUBCUTANEOUS
  Filled 2024-06-21: qty 0.4

## 2024-06-21 NOTE — ED Provider Notes (Signed)
 [facility] Provider Note    Event Date/Time   First MD Initiated Contact with Patient 06/21/24 1529     (approximate)   History   Code Stroke   HPI  [name] is a 63 y.o. male with pmh of CVA, conary artery disease and chronic systolic heart failure due to ischemic cardiomyopathy, diabetes mellitus, essential hypertension, CVA, hyperlipidemia and previous tobacco use who presents with left-sided sensation changes, personality changes, left lower extremity weakness and right facial weakness since yesterday at 7 PM.  Patient comes from home and is not on any blood thinners.  He arrives via EMS and no interventions were given en route.  He denies any headache chest pain or shortness of breath but does not answer every question correctly.      Physical Exam   Triage Vital Signs: ED Triage Vitals  Encounter Vitals Group     BP      Girls Systolic BP Percentile      Girls Diastolic BP Percentile      Boys Systolic BP Percentile      Boys Diastolic BP Percentile      Pulse      Resp      Temp      Temp src      SpO2      Weight      Height      Head Circumference      Peak Flow      Pain Score      Pain Loc      Pain Education      Exclude from Growth Chart     Most recent vital signs: Vitals:   06/21/24 1618  BP: 135/75  Pulse: 90  Resp: 18  Temp: (!) 97.3 F (36.3 C)  SpO2: 100%    Nursing Triage Note reviewed. Vital signs reviewed and patients oxygen saturation is normoxic  General: Patient is well nourished, well developed, awake and alert, resting comfortably in no acute distress Head: Normocephalic and atraumatic Eyes: Normal inspection, extraocular muscles intact, no conjunctival pallor Ear, nose, throat: Normal external exam Neck: Normal range of motion Respiratory: Patient is in no respiratory distress, lungs CTAB Cardiovascular: Patient is not tachycardic, RRR without murmur appreciated GI: Abd SNT with no guarding  or rebound  Back: Normal inspection of the back with good strength and range of motion throughout all ext Extremities: pulses intact with good cap refills, no LE pitting edema or calf tenderness Neuro: The patient is alert and oriented to person, place, but not time does not answer every question appropriately.  Left-sided sensation changes, neglect when looking to the left, right facial weakness, patient has left lower extremity tremulous Skin: Warm, dry, and intact Psych: Angry mood and affect, no SI or HI  ED Results / Procedures / Treatments   Labs (all labs ordered are listed, but only abnormal results are displayed) Labs Reviewed  CBC - Abnormal; Notable for the following components:      Result Value   WBC 19.3 (*)    All other components within normal limits  DIFFERENTIAL - Abnormal; Notable for the following components:   Neutro Abs 16.1 (*)    Monocytes Absolute 1.5 (*)    Abs Immature Granulocytes 0.10 (*)    All other components within normal limits  COMPREHENSIVE METABOLIC PANEL WITH GFR - Abnormal; Notable for the following components:   Potassium 3.3 (*)    CO2 21 (*)    AST  97 (*)    Total Bilirubin 1.3 (*)    Anion gap 16 (*)    All other components within normal limits  TROPONIN T, HIGH SENSITIVITY - Abnormal; Notable for the following components:   Troponin T High Sensitivity 50 (*)    All other components within normal limits  PROTIME-INR  APTT  LIPID PANEL  URINE DRUG SCREEN  HEMOGLOBIN A1C  CBG MONITORING, ED  TROPONIN T, HIGH SENSITIVITY     EKG EKG and rhythm strip are interpreted by myself:   EKG: [Normal sinus rhythm] at heart rate of 90, normal QRS duration, QTc 463, nonspecific ST segments and T waves no ectopy EKG not consistent with Acute STEMI Rhythm strip: NSR in lead II   RADIOLOGY CT head: No intracranial hemorrhage on my independent review interpretation radiologist agrees CT angio head and neck with perfusion:  2. Mildly  progressive intracranial atherosclerosis including moderate right and  mild left ICA stenoses.  3. Unchanged severe left P2 stenosis.      PROCEDURES:  Critical Care performed: Yes, see critical care procedure note(s)  .Critical Care  Performed by: Nicholaus Rolland BRAVO, MD Authorized by: Nicholaus Rolland BRAVO, MD   Critical care provider statement:    Critical care time (minutes):  45   Critical care was necessary to treat or prevent imminent or life-threatening deterioration of the following conditions:  CNS failure or compromise   Critical care was time spent personally by me on the following activities:  Development of treatment plan with patient or surrogate, discussions with consultants, evaluation of patient's response to treatment, examination of patient, ordering and review of laboratory studies, ordering and review of radiographic studies, ordering and performing treatments and interventions, pulse oximetry, re-evaluation of patient's condition and review of old charts   Care discussed with: admitting provider   Comments:     Evaluation as a code stroke immediately assessment and determination whether TNK was indicated, increasing agitation and frequent reassessments    MEDICATIONS ORDERED IN ED: Medications   stroke: early stages of recovery book (has no administration in time range)  iohexol  (OMNIPAQUE ) 350 MG/ML injection 100 mL (100 mLs Intravenous Contrast Given 06/21/24 1539)  diazepam  (VALIUM ) injection 2.5 mg (2.5 mg Intravenous Given 06/21/24 1651)     IMPRESSION / MDM / ASSESSMENT AND PLAN / ED COURSE                                Differential diagnosis includes, but is not limited to, CVA, intracranial hemorrhage, brain malignancy, electrolyte derangement anemia, UTI   ED course: Patient presents and does have focal neurological deficits within the last 24 hours and I am concerned about the possibility of a large vessel occlusion.  I did make him an acute stroke  alert but given the chronicity of his symptoms he is not a candidate for TNK.  CT head demonstrated no intracranial hemorrhage.  CTA with perfusion demonstrated no large vessel occlusion.  Stroke neurologist Dr. Matthews at bedside and states that she is very concerned that he did have a new stroke.  Allow for permissive hypertension.  Admit for MRI and further CVA workup.  Of note patient very agitated and trying to get out of bed.  Decision made to give small dose of IV Valium  with improvement.  Case discussed with hospitalist for admission   Clinical Course as of 06/21/24 1824  Thu Jun 21, 2024  1555 Neurologist at  bedside states that obviously patient is not a candidate for TNK however likely did have a stroke.  Awaiting CT angio with perfusion to ensure no intervenable lesion but low suspicion. [HD]  1702 Case discussed with hospitalist for admission [HD]    Clinical Course User Index [HD] Nicholaus Rolland BRAVO, MD   -- Risk: 5 This patient has a high risk of morbidity due to further diagnostic testing or treatment. Rationale: This patient's evaluation and management involve a high risk of morbidity due to the potential severity of presenting symptoms, need for diagnostic testing, and/or initiation of treatment that may require close monitoring. The differential includes conditions with potential for significant deterioration or requiring escalation of care. Treatment decisions in the ED, including medication administration, procedural interventions, or disposition planning, reflect this level of risk. COPA: 5 The patient has the following acute or chronic illness/injury that poses a possible threat to life or bodily function: [X] : The patient has a potentially serious acute condition or an acute exacerbation of a chronic illness requiring urgent evaluation and management in the Emergency Department. The clinical presentation necessitates immediate consideration of life-threatening or  function-threatening diagnoses, even if they are ultimately ruled out.   FINAL CLINICAL IMPRESSION(S) / ED DIAGNOSES   Final diagnoses:  Cerebrovascular accident (CVA), unspecified mechanism (HCC)     Rx / DC Orders   ED Discharge Orders     None        Note:  This document was prepared using Dragon voice recognition software and may include unintentional dictation errors.   Nicholaus Rolland BRAVO, MD 06/21/24 [phone]

## 2024-06-21 NOTE — ED Notes (Signed)
 Pt agitated in CT. This RN went to assist pt with urinal and pt is agitated about tele cords. This RN removed cords and said pt is not able to stand due to weakness in L leg. Pt stated  I will hold my urine until I burst . This Rn stated that wasn't necessary and Could be assisted to side of bed. Pt stated he was irritated. Friend at bedside said Pt is agitated. MD aware.

## 2024-06-21 NOTE — ED Notes (Signed)
 NURSE HAILEY RN INFORMED OF BED ASSIGNED

## 2024-06-21 NOTE — H&P (Addendum)
 History and Physical    [name] FMW:[id number] DOB: 1960-10-11 DOA: 06/21/2024  DOS: the patient was seen and examined on 06/21/2024  PCP: Aletha Bene, MD   Patient coming from: Home  I have personally briefly reviewed patient's old medical records in [facility] and CareEverywhere  HPI:   Aragorn Recker is a 63 y.o. year old male with medical history of hypertension, hyperlipidemia, type 2 diabetes, CAD status post CABG presenting to the ED with left-sided weakness that started yesterday.   Pt states he developed left-sided weakness and numbness yesterday.  When I inquired about the timing of this he states he is not sure but gets agitated if I mention 7 PM stating he is not sure what that number came up.  He states after the left-sided weakness he has had a few falls secondary to that.  Patient lives alone and he was having difficulty ambulating he called his friend who recommended he come to get evaluated in the ED.  Patient denies any other acute complaints but states he has been told that this could be a stroke and he is unsure why he is having a recurrent stroke.  I asked him regarding his medications and patient states he does not take them regularly and he is unsure when the last time he took his Crestor .  Did discuss at length that after acute stroke our goal is to prevent another stroke from happening and his adherence to medications very important   On arrival to the ED patient was noted to be HDS stable.  Lab work and imaging obtained.  CBC with significant leukocytosis at 19.3.  CMP shows mild hypokalemia, NAGMA and mild AST elevation but normal renal function.  Troponin checked and mildly elevated with repeat pending.  CT head without any acute findings and CTA shows no large vessel occlusion or acute ischemia but showed mild progressive intracranial atherosclerosis.  Patient presented as a code stroke and was evaluated by Dr. Matthews and she recommended admission for  further workup.  Given this, [facility] contacted for admission.  Review of Systems: As mentioned in the history of present illness. All other systems reviewed and are negative.   Past Medical History:  Diagnosis Date   Anemia 10/03/2009   Qualifier: Diagnosis of  By: Georgian ROSALEA CHARM Lamar    Anxiety associated with depression 10/11/2013   pt states he is no longer having this issue because he is happy with his job now   Colon cancer [facility])    right colon cancer- adenocarcinoma CEA level isnrmal at 1.8   Coronary artery disease    Diabetes mellitus    Diabetes mellitus type 2 in obese 03/20/2010   Qualifier: Diagnosis of  By: Georgian ROSALEA CHARM Lamar     Erectile dysfunction 06/22/2016   Essential hypertension 06/05/2014   FATTY LIVER DISEASE 11/04/2009   Qualifier: Diagnosis of  By: Abran MD, Norleen SAILOR   Qualifier: Diagnosis of  By: Abran MD, Norleen SAILOR  Last Assessment & Plan:  conirmed by CT scan of abdomen in April 2016 encouraged to minimize simple carbs and fatty foods.   GERD 11/04/2009   Qualifier: Diagnosis of  By: Abran MD, Norleen SAILOR    Great toe pain, right 04/18/2017   Hepatic artery stenosis    History of colon cancer 01/02/2010   Qualifier: Diagnosis of  By: Georgian ROSALEA CHARM Lamar Dr Abran  Last Assessment & Plan:  Follows closely with gastroenterology, Dr Abran   Hyperlipidemia  Hyperlipidemia, mixed 09/07/2010   Qualifier: Diagnosis of  By: Georgian ROSALEA CHARM Lamar    Hypertension    Hypertriglyceridemia 12/14/2010   Hypotestosteronism 06/22/2016   INSOMNIA, CHRONIC 03/20/2010   Qualifier: Diagnosis of  By: Georgian ROSALEA CHARM Lamar Ambien  10 mg daily does not keep asleep AdvilPM is over sedating and has trouble waking up    Iron  deficiency anemia 2011   Low testosterone  06/22/2016   Malignant neoplasm of colon (HCC) 01/02/2010   Qualifier: Diagnosis of  By: Georgian ROSALEA CHARM Lamar Dr Abran    Muscle spasm of back 06/05/2014   Nipple pain 09/30/2016   Obesity    Palpitations 06/05/2014   Preventative health  care 10/08/2012   Psoriasis    Psoriatic arthritis (HCC) 08/25/2017   Right knee pain 11/17/2011   Stroke [facility])     Past Surgical History:  Procedure Laterality Date   BUBBLE STUDY  10/21/2020   Procedure: BUBBLE STUDY;  Surgeon: Mona Vinie BROCKS, MD;  Location: [facility] ENDOSCOPY;  Service: Cardiovascular;;   CARPAL TUNNEL RELEASE     RIGHT   CHOLECYSTECTOMY  1994   COLONOSCOPY     CORONARY ARTERY BYPASS GRAFT N/A 09/19/2023   Procedure: CORONARY ARTERY BYPASS GRAFTING (CABG) TIMES TWO USING LEFT INTERNAL MAMMARY ARTERY AND ENDOSCOPICALLY HARVESTED RIGHT GREATER SAPHENOUS VEIN;  Surgeon: Lucas Dorise POUR, MD;  Location: [facility] OR;  Service: Open Heart Surgery;  Laterality: N/A;   CORONARY/GRAFT ACUTE MI REVASCULARIZATION N/A 10/27/2019   Procedure: Coronary/Graft Acute MI Revascularization;  Surgeon: Darron Deatrice LABOR, MD;  Location: [facility] INVASIVE CV LAB;  Service: Cardiovascular;  Laterality: N/A;   HEMICOLECTOMY     12/17/2009 right   LEFT HEART CATH AND CORONARY ANGIOGRAPHY N/A 10/27/2019   Procedure: LEFT HEART CATH AND CORONARY ANGIOGRAPHY;  Surgeon: Darron Deatrice LABOR, MD;  Location: [facility] INVASIVE CV LAB;  Service: Cardiovascular;  Laterality: N/A;   LOOP RECORDER INSERTION N/A 10/21/2020   Procedure: LOOP RECORDER INSERTION;  Surgeon: Kelsie Agent, MD;  Location: [facility] INVASIVE CV LAB;  Service: Cardiovascular;  Laterality: N/A;   POLYPECTOMY     RIGHT/LEFT HEART CATH AND CORONARY ANGIOGRAPHY Bilateral 08/12/2023   Procedure: RIGHT/LEFT HEART CATH AND CORONARY ANGIOGRAPHY;  Surgeon: Darron Deatrice LABOR, MD;  Location: [facility] INVASIVE CV LAB;  Service: Cardiovascular;  Laterality: Bilateral;   TEE WITHOUT CARDIOVERSION N/A 10/21/2020   Procedure: TRANSESOPHAGEAL ECHOCARDIOGRAM (TEE);  Surgeon: Mona Vinie BROCKS, MD;  Location: [facility] ENDOSCOPY;  Service: Cardiovascular;  Laterality: N/A;   TEE WITHOUT CARDIOVERSION N/A 09/19/2023   Procedure: TRANSESOPHAGEAL ECHOCARDIOGRAM (TEE);  Surgeon: Lucas Dorise POUR, MD;   Location: [facility] OR;  Service: Open Heart Surgery;  Laterality: N/A;     Allergies  Allergen Reactions   Clopidogrel  Anaphylaxis    Weird sensation in legs     Family History  Problem Relation Age of Onset   Stomach cancer Mother        diedinher 40's   Diabetes type II Brother        boderline   Diabetes Brother        type II   Alcohol abuse Brother    Other Neg Hx        cad,prostate ca, colon ca   Coronary artery disease Neg Hx    Cancer Neg Hx        colon, prostate   Colon cancer Neg Hx    Esophageal cancer Neg Hx    Rectal cancer Neg Hx     Prior to Admission medications  Medication Sig Start Date End Date Taking? Authorizing Provider  Adalimumab-ryvk, 2 Pen, 40 MG/0.4ML AJKT Inject into the skin. Patient taking differently: Inject into the skin every 30 (thirty) days.    [provider]  aspirin  EC 325 MG tablet Take 1 tablet (325 mg total) by mouth daily. 09/23/23   Raguel Con RAMAN, PA-C  Blood Glucose Monitoring Suppl (CONTOUR NEXT EZ) w/Device KIT 1 each by Does not apply route as directed. 11/10/23   Simmons-Robinson, Makiera, MD  Continuous Glucose Sensor (FREESTYLE LIBRE 3 PLUS SENSOR) MISC Change sensor every 15 days. 05/11/24   Aletha Bene, MD  diazepam  (VALIUM ) 2 MG tablet Take 1 tablet (2 mg total) by mouth daily as needed for anxiety. 05/20/23   Emilio Kelly DASEN, FNP  escitalopram  (LEXAPRO ) 10 MG tablet Take 1 tablet (10 mg total) by mouth daily. 04/18/24   Aletha Bene, MD  glucose blood (CONTOUR NEXT TEST) test strip 1 each by Other route 4 (four) times daily - after meals and at bedtime. Use as instructed 06/01/23   Emilio Kelly T, FNP  Insulin  Glargine (BASAGLAR  KWIKPEN) 100 UNIT/ML Inject 5 Units into the skin at bedtime. Titrate to Fasting Blood Sugar of 100- 150 mg/dL with morning blood sugar check. 11/10/23   Simmons-Robinson, Rockie, MD  insulin  lispro (HUMALOG ) 100 UNIT/ML KwikPen Only take 3 units with meals if BG is greater than 250  up to three times daily 11/10/23   Simmons-Robinson, Rockie, MD  metFORMIN  (GLUCOPHAGE -XR) 750 MG 24 hr tablet Take 1 tablet (750 mg total) by mouth 2 (two) times daily after a meal. Patient taking differently: Take 750 mg by mouth as needed. 05/20/23   Emilio Kelly DASEN, FNP  rosuvastatin  (CRESTOR ) 20 MG tablet Take 1 tablet (20 mg total) by mouth every evening. 11/10/23   Simmons-Robinson, Makiera, MD  Semaglutide , 2 MG/DOSE, 8 MG/3ML SOPN Inject 2 mg as directed once a week. 05/20/23   Emilio Kelly DASEN, FNP  zolpidem  (AMBIEN ) 10 MG tablet Take 1 tablet (10 mg total) by mouth at bedtime as needed. 03/21/24   Aletha Bene, MD    Social History:  reports that he quit smoking about 11 months ago. His smoking use included cigarettes. He has never used smokeless tobacco. He reports that he does not drink alcohol and does not use drugs. Lives by himself Tobacco- Denies current use. EtOH- Denies use.  Illicit drug use- denies use.  IADLs/ADLs- can perform independently at baseline    Physical Exam: Vitals:   06/21/24 1617 06/21/24 1618 06/21/24 1845  BP:  135/75 129/88  Pulse:  90 99  Resp:  18   Temp:  (!) 97.3 F (36.3 C) 97.7 F (36.5 C)  TempSrc:  Oral Oral  SpO2:  100%   Weight: 79.9 kg    Height: 5' 8 (1.727 m)       Gen: NAD, appears older than stated age HENT: NCAT CV: Regular rate and rhythm, good pulses in all extremities Resp: CTAB Abd: No TTP, no bowel sounds MSK: Good bulk and tone, left lower extremity with more tone compared to right Skin: No lesions noted on examined skin Neuro: Alert and oriented x 4, CN II-IV intact. CN VI-XII intact. Diminished facial sensation on left side. No facial asymmetry. XII intact left upper extremity with 4 out of 5 strength and slightly decreased grip strength compared to right upper extremity with 5 out of 5 in intact grip strength.  Left lower extremity with 4 out of 5 and right  with 5 out of 5 strength.  Diminished sensation on left upper  and lower extremity. Psych:anxious mood   Labs on Admission: I have personally reviewed following labs and imaging studies  CBC: Recent Labs  Lab 06/21/24 1530  WBC 19.3*  NEUTROABS 16.1*  HGB 14.9  HCT 42.4  MCV 92.0  PLT 279   Basic Metabolic Panel: Recent Labs  Lab 06/21/24 1530  NA 140  K 3.3*  CL 104  CO2 21*  GLUCOSE 71  BUN 10  CREATININE 1.14  CALCIUM  9.7   GFR: Estimated Creatinine Clearance: 65.8 mL/min (by C-G formula based on SCr of 1.14 mg/dL). Liver Function Tests: Recent Labs  Lab 06/21/24 1530  AST 97*  ALT 21  ALKPHOS 54  BILITOT 1.3*  PROT 7.1  ALBUMIN  4.2   No results for input(s): LIPASE, AMYLASE in the last 168 hours. No results for input(s): AMMONIA in the last 168 hours. Coagulation Profile: Recent Labs  Lab 06/21/24 1530  INR 1.1   Cardiac Enzymes: No results for input(s): CKTOTAL, CKMB, CKMBINDEX, TROPONINI, TROPONINIHS in the last 168 hours. BNP (last 3 results) No results for input(s): BNP in the last 8760 hours. HbA1C: No results for input(s): HGBA1C in the last 72 hours. CBG: Recent Labs  Lab 06/21/24 1529  GLUCAP 84   Lipid Profile: Recent Labs    06/21/24 1530  CHOL 162  HDL 45  LDLCALC 96  TRIG 108  CHOLHDL 3.6   Thyroid  Function Tests: No results for input(s): TSH, T4TOTAL, FREET4, T3FREE, THYROIDAB in the last 72 hours. Anemia Panel: No results for input(s): VITAMINB12, FOLATE, FERRITIN, TIBC, IRON , RETICCTPCT in the last 72 hours. Urine analysis:    Component Value Date/Time   COLORURINE YELLOW 09/15/2023 1631   APPEARANCEUR CLEAR 09/15/2023 1631   APPEARANCEUR Clear 10/27/2014 1429   LABSPEC 1.017 09/15/2023 1631   LABSPEC 1.033 10/27/2014 1429   PHURINE 5.0 09/15/2023 1631   GLUCOSEU >=500 (A) 09/15/2023 1631   GLUCOSEU >=500 10/27/2014 1429   HGBUR NEGATIVE 09/15/2023 1631   BILIRUBINUR NEGATIVE 09/15/2023 1631   BILIRUBINUR Negative 10/27/2014 1429    KETONESUR NEGATIVE 09/15/2023 1631   PROTEINUR NEGATIVE 09/15/2023 1631   UROBILINOGEN 0.2 10/02/2012 1041   NITRITE NEGATIVE 09/15/2023 1631   LEUKOCYTESUR NEGATIVE 09/15/2023 1631   LEUKOCYTESUR Negative 10/27/2014 1429    Radiological Exams on Admission: I have personally reviewed images DG Chest Port 1 View Result Date: 06/21/2024 CLINICAL DATA:  CVA. Order comments state looking for pacemaker lead for MRI. EXAM: PORTABLE CHEST 1 VIEW COMPARISON:  Chest radiograph 10/26/2023 FINDINGS: Median sternotomy.The cardiomediastinal contours are normal. Coronary stent. No pacemaker or evidence of epicardial leads demonstrated. Left chest wall loop recorder. Pulmonary vasculature is normal. No consolidation, pleural effusion, or pneumothorax. No acute osseous abnormalities are seen. IMPRESSION: 1. No acute chest findings. 2. Median sternotomy and coronary stent. Left chest wall loop recorder. No pacemaker or epicardial leads demonstrated. Electronically Signed   By: Andrea Gasman M.D.   On: 06/21/2024 19:20   CT ANGIO HEAD NECK W WO CM W PERF (CODE STROKE) LKW > 6h Result Date: 06/21/2024 EXAM: CTA Head and Neck with Perfusion 06/21/2024 04:12:53 PM TECHNIQUE: CTA of the head and neck was performed without and with the administration of 100 mL of iohexol  (OMNIPAQUE ) 350 MG/ML injection. 3D postprocessing with multiplanar reconstructions and MIPs was performed to evaluate the vascular anatomy. Cerebral perfusion analysis using computed tomography with contrast administration, including post-processing of parametric maps with determination of cerebral  blood flow, cerebral blood volume, mean transit time and time-to-maximum. Automated exposure control, iterative reconstruction, and/or weight based adjustment of the mA/kV was utilized to reduce the radiation dose to as low as reasonably achievable. COMPARISON: CTA head and neck 10/19/2020 CLINICAL HISTORY: Neuro deficit, acute, stroke suspected. Left sided  facial deficit, left grip weakness, and difficulty walking with a fall. FINDINGS: CTA NECK: AORTIC ARCH AND ARCH VESSELS: No dissection or arterial injury. No significant stenosis of the brachiocephalic or subclavian arteries. CERVICAL CAROTID ARTERIES: Small amount of calcified plaque at the right carotid bifurcation and small amount of soft plaque at the left carotid bifurcation not resulting in a 50% or greater stenosis. CERVICAL VERTEBRAL ARTERIES: Mildly dominant left vertebral artery. No dissection, arterial injury, or significant stenosis. LUNGS AND MEDIASTINUM: Unremarkable. SOFT TISSUES: No acute abnormality. BONES: Moderate cervical spondylosis. CTA HEAD: ANTERIOR CIRCULATION: The intracranial internal carotid arteries are patent with mildly progressive siphon atherosclerosis resulting in moderate right and mild left cavernous segment stenoses. ACAs and MCAs are patent with progressive branch vessel irregularity but no evidence of a proximal branch occlusion or flow limiting proximal stenosis. No aneurysm. POSTERIOR CIRCULATION: The intracranial vertebral arteries are widely patent to the basilar. Patent AICA and SCA origins are visualized bilaterally. The basilar artery is widely patent. Posterior communicating arteries are diminutive or absent. Both PCAs are patent without evidence of a significant proximal stenosis on the right. There is a severe proximal to mid left P2 stenosis, similar to prior. No aneurysm. OTHER: No dural venous sinus thrombosis on this non-dedicated study. CT PERFUSION: EXAM QUALITY: Exam quality is adequate with diagnostic perfusion maps. No significant motion artifact. Appropriate arterial inflow and venous outflow curves. CORE INFARCT (CBF<30% volume): 0 mL TOTAL HYPOPERFUSION (Tmax>6s volume): 0 mL PENUMBRA: Mismatch volume: 0 mL Mismatch ratio: not applicable Location: not applicable IMPRESSION: 1. No large vessel occlusion or evidence of acute ischemia on CT perfusion. 2.  Mildly progressive intracranial atherosclerosis including moderate right and mild left ICA stenoses. 3. Unchanged severe left P2 stenosis. 4. These results were communicated to Dr. KYM Ross at 4:17 PM on 06/21/2024 by secure text page via the [facility] messaging system. Electronically signed by: Dasie Hamburg MD 06/21/2024 04:52 PM EST RP Workstation: [id number]   CT HEAD CODE STROKE WO CONTRAST (LKW 0-4.5h, LVO 0-24h) Result Date: 06/21/2024 EXAM: CT HEAD WITHOUT CONTRAST TECHNIQUE: CT of the head was performed without the administration of intravenous contrast. Automated exposure control, iterative reconstruction, and/or weight based adjustment of the mA/kV was utilized to reduce the radiation dose to as low as reasonably achievable. COMPARISON: Head CT and MRI 03/17/2022. CLINICAL HISTORY: Neuro deficit, acute, stroke suspected. Left sided facial deficit, left grip weakness, and difficulty walking with a fall. FINDINGS: BRAIN AND VENTRICLES: There is no evidence of an acute infarct, intracranial hemorrhage, mass, midline shift, hydrocephalus, or extra-axial fluid collection. There is mild cerebral atrophy. A chronic periventricular infarct is again noted along the posterior body and atrium of the left lateral ventricle with mild ex vacuo ventricular enlargement. A chronic lacunar infarct in the left caudate head is unchanged. A lacunar infarct in the right frontal periventricular white matter is new but also chronic in appearance. Mild hypodensities elsewhere in the cerebral white matter bilaterally are nonspecific but compatible with chronic small vessel ischemic disease. Calcified atherosclerosis at the skull base. ORBITS: No acute abnormality. SINUSES: No acute abnormality. SOFT TISSUES AND SKULL: No acute soft tissue abnormality. No skull fracture. [place] Stroke Program Early CT Score (ASPECTS) -----  Ganglionic (caudate, IC, lentiform nucleus, insula, M1-M3): 7 Supraganglionic (M4-M6): 3 Total: 10 IMPRESSION:  1. No acute intracranial abnormality. ASPECTS of 10. 2. Chronic small vessel ischemia with old infarcts as above. 3. These results were communicated to Dr. KYM Ross at 3:56 PM on June 21, 2024 by secure text page via the [facility] messaging system. Electronically signed by: Dasie Hamburg MD 06/21/2024 03:57 PM EST RP Workstation: [id number]    EKG: My personal interpretation of EKG shows: Sinus rhythm without any acute ST changes  Assessment/Plan Principal Problem:   CVA (cerebral vascular accident) (HCC) Active Problems:   Hypertension associated with diabetes (HCC)   Hyperlipidemia associated with type 2 diabetes mellitus (HCC)   Diabetes mellitus (HCC)   Coronary artery disease   S/P CABG x 2 Pt with symptoms of weakness that is unilateral likely having a stroke. MRI pending.  Pt outside the window of tPA. Neurology consulted, appreciate their recommendations.  Patient does endorse nonadherence to his chronic medications - Allow for permissive HTN in the setting of acute stroke  - ASA 81 mg daily and Brillinta, will await neurology recommendations regarding further antiplatelets.  - High Intensity Statin - Echocardiogram  - A1C  - Lipid panel  - Tele monitoring  - PT/OT  Hypertension: Permissive hypertension in setting of strokes  Hyperlipidemia: Continue high intensity statin.  Follow-up lipid panel and anxiety if needed.  Type II Diabetes: On insulin  and ozempic . start SSI. CTM and adjust.  Psoriatic arthritis: on injectable once a month. Continue outpatient.  MDD/Insomnia: continue home seroqeul and SSRI.   IDA: improved with oral supplementation. Continue while pt is admitted.   VTE prophylaxis:  Lovenox   Diet: Heart healthy given patient passed bedside swallow Code Status:  Full Code Telemetry:  Admission status: Observation, Telemetry bed Patient is from: Home Anticipated d/c is to: Home Anticipated d/c is in: 1-2 days   Family Communication: Updated at  bedside  Consults called: Dr. Ross with neurology   Severity of Illness: The appropriate patient status for this patient is OBSERVATION. Observation status is judged to be reasonable and necessary in order to provide the required intensity of service to ensure the patient's safety. The patient's presenting symptoms, physical exam findings, and initial radiographic and laboratory data in the context of their medical condition is felt to place them at decreased risk for further clinical deterioration. Furthermore, it is anticipated that the patient will be medically stable for discharge from the hospital within 2 midnights of admission.    Morene Bathe, MD Jolynn DEL. [facility]

## 2024-06-21 NOTE — ED Notes (Signed)
 This RN assisted pt to bedside commode with walker. Gate unsteady. Pt stated I can't urinate with you guys around me This RN informed pt that his gate is unsteady due to his L leg being weak but This RN would stand behind him for privacy. Pt assisted back to bed with Ginny, Medic. Pt stated  I'm ready to go, die Male family member at bedside sighing loudly and apologizing for pt behavior. Pt assisted into gown and belongings given to family member. This RN comforted pt that his vitals are stable and his admission is required for observation.

## 2024-06-21 NOTE — Progress Notes (Signed)
 CODE STROKE- PHARMACY COMMUNICATION   Time CODE STROKE called/page received: 15:38  Time response to CODE STROKE was made (in person or via phone): 15:45  Time Stroke Kit retrieved from [employer] (only if needed): Not needed. Last well known previous night  Name of Provider/Nurse contacted: Dr. Matthews  Past Medical History:  Diagnosis Date   Anemia 10/03/2009   Qualifier: Diagnosis of  By: Georgian ROSALEA CHARM Lamar    Anxiety associated with depression 10/11/2013   pt states he is no longer having this issue because he is happy with his job now   Colon cancer [facility])    right colon cancer- adenocarcinoma CEA level isnrmal at 1.8   Coronary artery disease    Diabetes mellitus    Diabetes mellitus type 2 in obese 03/20/2010   Qualifier: Diagnosis of  By: Georgian ROSALEA CHARM Lamar     Erectile dysfunction 06/22/2016   Essential hypertension 06/05/2014   FATTY LIVER DISEASE 11/04/2009   Qualifier: Diagnosis of  By: Abran MD, Norleen SAILOR   Qualifier: Diagnosis of  By: Abran MD, Norleen SAILOR  Last Assessment & Plan:  conirmed by CT scan of abdomen in April 2016 encouraged to minimize simple carbs and fatty foods.   GERD 11/04/2009   Qualifier: Diagnosis of  By: Abran MD, Norleen SAILOR    Great toe pain, right 04/18/2017   Hepatic artery stenosis    History of colon cancer 01/02/2010   Qualifier: Diagnosis of  By: Georgian ROSALEA CHARM Lamar Dr Abran  Last Assessment & Plan:  Follows closely with gastroenterology, Dr Abran   Hyperlipidemia    Hyperlipidemia, mixed 09/07/2010   Qualifier: Diagnosis of  By: Georgian ROSALEA CHARM Lamar    Hypertension    Hypertriglyceridemia 12/14/2010   Hypotestosteronism 06/22/2016   INSOMNIA, CHRONIC 03/20/2010   Qualifier: Diagnosis of  By: Georgian ROSALEA CHARM Lamar Ambien  10 mg daily does not keep asleep AdvilPM is over sedating and has trouble waking up    Iron  deficiency anemia 2011   Low testosterone  06/22/2016   Malignant neoplasm of colon (HCC) 01/02/2010   Qualifier: Diagnosis of  By: Georgian ROSALEA CHARM Lamar Dr  Abran    Muscle spasm of back 06/05/2014   Nipple pain 09/30/2016   Obesity    Palpitations 06/05/2014   Preventative health care 10/08/2012   Psoriasis    Psoriatic arthritis (HCC) 08/25/2017   Right knee pain 11/17/2011   Stroke [facility])    Prior to Admission medications   Medication Sig Start Date End Date Taking? Authorizing Provider  Adalimumab-ryvk, 2 Pen, 40 MG/0.4ML AJKT Inject into the skin. Patient taking differently: Inject into the skin every 30 (thirty) days.    [provider]  aspirin  EC 325 MG tablet Take 1 tablet (325 mg total) by mouth daily. 09/23/23   Raguel Con RAMAN, PA-C  Blood Glucose Monitoring Suppl (CONTOUR NEXT EZ) w/Device KIT 1 each by Does not apply route as directed. 11/10/23   Simmons-Robinson, Makiera, MD  Continuous Glucose Sensor (FREESTYLE LIBRE 3 PLUS SENSOR) MISC Change sensor every 15 days. 05/11/24   Aletha Bene, MD  diazepam  (VALIUM ) 2 MG tablet Take 1 tablet (2 mg total) by mouth daily as needed for anxiety. 05/20/23   Emilio Kelly DASEN, FNP  escitalopram  (LEXAPRO ) 10 MG tablet Take 1 tablet (10 mg total) by mouth daily. 04/18/24   Aletha Bene, MD  glucose blood (CONTOUR NEXT TEST) test strip 1 each by Other route 4 (four) times daily - after meals and  at bedtime. Use as instructed 06/01/23   Emilio Marseille T, FNP  Insulin  Glargine (BASAGLAR  KWIKPEN) 100 UNIT/ML Inject 5 Units into the skin at bedtime. Titrate to Fasting Blood Sugar of 100- 150 mg/dL with morning blood sugar check. 11/10/23   Simmons-Robinson, Rockie, MD  insulin  lispro (HUMALOG ) 100 UNIT/ML KwikPen Only take 3 units with meals if BG is greater than 250 up to three times daily 11/10/23   Simmons-Robinson, Rockie, MD  metFORMIN  (GLUCOPHAGE -XR) 750 MG 24 hr tablet Take 1 tablet (750 mg total) by mouth 2 (two) times daily after a meal. Patient taking differently: Take 750 mg by mouth as needed. 05/20/23   Emilio Marseille DASEN, FNP  rosuvastatin  (CRESTOR ) 20 MG tablet Take 1 tablet (20  mg total) by mouth every evening. 11/10/23   Simmons-Robinson, Makiera, MD  Semaglutide , 2 MG/DOSE, 8 MG/3ML SOPN Inject 2 mg as directed once a week. 05/20/23   Emilio Marseille DASEN, FNP  zolpidem  (AMBIEN ) 10 MG tablet Take 1 tablet (10 mg total) by mouth at bedtime as needed. 03/21/24   Aletha Bene, MD    Ransom Blanch PGY-1 Pharmacy Resident  [facility]  06/21/2024 3:46 PM

## 2024-06-21 NOTE — ED Notes (Signed)
 Warm blankets given.

## 2024-06-21 NOTE — ED Triage Notes (Addendum)
 Pt BIBAEMS from home c/o new onset of L sided deficits. LKW at 7pm last night. Hx stroke 2025 with R sided deficits. Pt stated he fell last night and is having uncontrollable movements in L leg.  Pt states he fell last night due to his L leg becoming weak. Alert and Oriented.

## 2024-06-21 NOTE — ED Notes (Signed)
 Pt requesting a male nurse stating  I can not urinate with a woman here I informed pt that he will most likely get a male nurse upstairs. Pt verbalized understanding. Male condom cath in place. Pt tolerated well.

## 2024-06-21 NOTE — ED Notes (Signed)
Activated Code Stroke w/[facility] 

## 2024-06-21 NOTE — Plan of Care (Signed)

## 2024-06-22 ENCOUNTER — Observation Stay: Admit: 2024-06-22

## 2024-06-22 ENCOUNTER — Observation Stay: Admit: 2024-06-22 | Discharge: 2024-06-22 | Disposition: A | Attending: Internal Medicine | Admitting: Internal Medicine

## 2024-06-22 DIAGNOSIS — I639 Cerebral infarction, unspecified: Secondary | ICD-10-CM | POA: Diagnosis not present

## 2024-06-22 DIAGNOSIS — I6389 Other cerebral infarction: Secondary | ICD-10-CM | POA: Diagnosis not present

## 2024-06-22 DIAGNOSIS — E785 Hyperlipidemia, unspecified: Secondary | ICD-10-CM | POA: Diagnosis not present

## 2024-06-22 DIAGNOSIS — E1139 Type 2 diabetes mellitus with other diabetic ophthalmic complication: Secondary | ICD-10-CM | POA: Diagnosis not present

## 2024-06-22 DIAGNOSIS — E1169 Type 2 diabetes mellitus with other specified complication: Secondary | ICD-10-CM | POA: Diagnosis not present

## 2024-06-22 DIAGNOSIS — Z794 Long term (current) use of insulin: Secondary | ICD-10-CM | POA: Diagnosis not present

## 2024-06-22 LAB — TROPONIN T, HIGH SENSITIVITY: Troponin T High Sensitivity: 43 ng/L — ABNORMAL HIGH (ref 0–19)

## 2024-06-22 LAB — ECHOCARDIOGRAM COMPLETE
AR max vel: 2.54 cm2
AV Area VTI: 2.46 cm2
AV Area mean vel: 2.32 cm2
AV Mean grad: 2 mmHg
AV Peak grad: 3.1 mmHg
Ao pk vel: 0.89 m/s
Area-P 1/2: 4.49 cm2
Calc EF: 42.1 %
Height: 68 in
MV VTI: 3.43 cm2
S' Lateral: 2.1 cm
Single Plane A2C EF: 50 %
Single Plane A4C EF: 36.8 %
Weight: 2818.36 [oz_av]

## 2024-06-22 LAB — GLUCOSE, CAPILLARY
Glucose-Capillary: 95 mg/dL (ref 70–99)
Glucose-Capillary: 158 mg/dL — ABNORMAL HIGH (ref 70–99)

## 2024-06-22 LAB — HEMOGLOBIN A1C
Hgb A1c MFr Bld: 6.1 % — ABNORMAL HIGH (ref 4.8–5.6)
Mean Plasma Glucose: 128 mg/dL

## 2024-06-22 MED ORDER — TICAGRELOR 90 MG PO TABS: 90.0000 mg | ORAL_TABLET | Freq: Two times a day (BID) | ORAL | 0 refills | Status: AC

## 2024-06-22 MED ORDER — ROSUVASTATIN CALCIUM 20 MG PO TABS: 20.0000 mg | ORAL_TABLET | Freq: Every evening | ORAL | 3 refills | Status: AC

## 2024-06-22 MED ORDER — PERFLUTREN LIPID MICROSPHERE
1.0000 mL | INTRAVENOUS | Status: AC | PRN
  Administered 2024-06-22: 2 mL via INTRAVENOUS

## 2024-06-22 MED ORDER — ASPIRIN 81 MG PO TBEC: 81.0000 mg | DELAYED_RELEASE_TABLET | Freq: Every day | ORAL | 12 refills | Status: AC

## 2024-06-22 NOTE — Evaluation (Signed)
 Physical Therapy Evaluation Patient Details Name: [name] MRN: [id number] DOB: 02-16-1961 Today's Date: 06/22/2024  History of Present Illness  [name] is a 63 y.o. year old male with medical history of hypertension, hyperlipidemia, type 2 diabetes, CAD status post CABG presenting to the ED with left-sided weakness that started yesterday.  Clinical Impression  Patient noted to be in sitting position at PT arrival in room, for an initial PT evaluation due to a decline in functional status, with baseline mobility reported as independent, and currently requiring CGA for transfers and ambulation. The patient is A&O x 4 very agitated, presenting with significant unwillingness to work with PT and OT but eventually agreed and goals of going home. The patient resides in a house with 2 steps to enter with no family/friend support. Gait was assessed with Rolator, limited by L sided weakness grossly. The overall clinical impression is that the patient presents with moderate mobility limitations secondary to recent CVA. Recommended skilled PT will address safety, mobility, and discharge planning. PT recommendation to d/c patient to HHPT upon medical clearance.        If plan is discharge home, recommend the following: A little help with walking and/or transfers;A little help with bathing/dressing/bathroom;Help with stairs or ramp for entrance   Can travel by private vehicle        Equipment Recommendations None recommended by PT  Recommendations for Other Services       Functional Status Assessment Patient has had a recent decline in their functional status and/or demonstrates limited ability to make significant improvements in function in a reasonable and predictable amount of time     Precautions / Restrictions Precautions Precautions: Fall Restrictions Weight Bearing Restrictions Per Provider Order: No      Mobility  Bed Mobility                    Transfers Overall  transfer level: Needs assistance Equipment used: Rollator (4 wheels) Transfers: Sit to/from Stand Sit to Stand: Contact guard assist, Supervision                Ambulation/Gait   Gait Distance (Feet): 220 Feet Assistive device: Rollator (4 wheels) Gait Pattern/deviations: Step-through pattern, Decreased stride length, Drifts right/left, Narrow base of support       General Gait Details: decreased push off on L side due to gross likley focal weakness in quad and hip extension; limited ability to grasp rollator handle and uses visual feedback to grasp hand; mild hip drop on L side  Stairs Stairs: Yes Stairs assistance: Contact guard assist, Min assist Stair Management: Two rails, Forwards, Backwards Number of Stairs: 4 General stair comments: forward strategy to ascend steps and backwards strategy to descend steps  Wheelchair Mobility     Tilt Bed    Modified Rankin (Stroke Patients Only)       Balance Overall balance assessment: Needs assistance Sitting-balance support: Feet supported Sitting balance-Leahy Scale: Good     Standing balance support: During functional activity, Reliant on assistive device for balance Standing balance-Leahy Scale: Fair Standing balance comment: limited but has functional use of left arm to support; declines further L side specific assessment                             Pertinent Vitals/Pain Pain Assessment Pain Assessment: No/denies pain    Home Living Family/patient expects to be discharged to:: Private residence Living Arrangements: Alone Available Help at Discharge:  Family;Available PRN/intermittently Type of Home: House Home Access: Stairs to enter Entrance Stairs-Rails: None Entrance Stairs-Number of Steps: 2   Home Layout: One level Home Equipment: Agricultural [employer]ant (2 wheels);Rollator (4 wheels);BSC/3in1;Shower seat      Prior Function Prior Level of Function : Independent/Modified  Independent;Working/employed;Driving             Mobility Comments: indep without AD ADLs Comments: indep     Extremity/Trunk Assessment   Upper Extremity Assessment Upper Extremity Assessment: Generalized weakness;LUE deficits/detail LUE Sensation: decreased light touch;decreased proprioception LUE Coordination: decreased fine motor;decreased gross motor    Lower Extremity Assessment Lower Extremity Assessment: Generalized weakness;LLE deficits/detail LLE Sensation: decreased light touch LLE Coordination: decreased fine motor    Cervical / Trunk Assessment Cervical / Trunk Assessment: Normal  Communication   Communication Communication: No apparent difficulties    Cognition Arousal: Alert Behavior During Therapy: Agitated, WFL for tasks assessed/performed   PT - Cognitive impairments: No apparent impairments                         Following commands: Intact       Cueing Cueing Techniques: Verbal cues     General Comments      Exercises     Assessment/Plan    PT Assessment    PT Problem List         PT Treatment Interventions      PT Goals (Current goals can be found in the Care Plan section)  Acute Rehab PT Goals Patient Stated Goal: Pt wants to go home PT Goal Formulation: With patient Time For Goal Achievement: 07/13/24 Potential to Achieve Goals: Good    Frequency       Co-evaluation PT/OT/SLP Co-Evaluation/Treatment: Yes Reason for Co-Treatment: Complexity of the patient's impairments (multi-system involvement) PT goals addressed during session: Mobility/safety with mobility;Balance;Proper use of DME OT goals addressed during session: ADL's and self-care;Strengthening/ROM       AM-PAC PT 6 Clicks Mobility  Outcome Measure Help needed turning from your back to your side while in a flat bed without using bedrails?: None Help needed moving from lying on your back to sitting on the side of a flat bed without using  bedrails?: A Little Help needed moving to and from a bed to a chair (including a wheelchair)?: A Little Help needed standing up from a chair using your arms (e.g., wheelchair or bedside chair)?: A Little Help needed to walk in hospital room?: A Little Help needed climbing 3-5 steps with a railing? : A Little 6 Click Score: 19    End of Session   Activity Tolerance: Patient tolerated treatment well;Treatment limited secondary to agitation Patient left: in chair;with call bell/phone within reach Nurse Communication: Mobility status PT Visit Diagnosis: Other abnormalities of gait and mobility (R26.89);Muscle weakness (generalized) (M62.81);Hemiplegia and hemiparesis Hemiplegia - Right/Left: Left Hemiplegia - dominant/non-dominant: Dominant Hemiplegia - caused by: Other cerebrovascular disease;Unspecified    Time: 9085-9061 PT Time Calculation (min) (ACUTE ONLY): 24 min   Charges:   PT Evaluation $PT Eval Low Complexity: 1 Low   PT General Charges $$ ACUTE PT VISIT: 1 Visit         Sherlean Lesches DPT, PT    Kyreese Chio A Shaunette Gassner 06/22/2024, 10:24 AM

## 2024-06-22 NOTE — Discharge Summary (Addendum)
 Physician Discharge Summary   Patient: [name] MRN: [id number] DOB: [birth year]  Admit date:     06/21/2024  Discharge date: 06/22/24  Discharge Physician: Leita Blanch   PCP: Aletha Bene, MD   Recommendations at discharge:    F/u PCP in 1-2 weeks ambulatory referral for neurology center [facility]  Discharge Diagnoses: Principal Problem:   CVA (cerebral vascular accident) [facility]) Active Problems:   Hypertension associated with diabetes (HCC)   Hyperlipidemia associated with type 2 diabetes mellitus (HCC)   Diabetes mellitus (HCC)   Coronary artery disease   S/P CABG x 2  Octavia Mottola is a 63 y.o. year old male with medical history of hypertension, hyperlipidemia, type 2 diabetes, CAD status post CABG presenting to the ED with left-sided weakness that started yesterday.     Pt states he developed left-sided weakness and numbness yesterday.  When I inquired about the timing of this he states he is not sure but gets agitated if I mention 7 PM stating he is not sure what that number came up.  He states after the left-sided weakness he has had a few falls secondary to that.  Patient lives alone and he was having difficulty ambulating he called his friend who recommended he come to get evaluated in the ED.  CT head without any acute findings and CTA shows no large vessel occlusion or acute ischemia but showed mild progressive intracranial atherosclerosis.  Patient presented as a code stroke and was evaluated by Dr. Matthews and she recommended admission for further workup.   Acute Right Parietal,Temporal,occipital CVA, ischemic -- Pt with symptoms of weakness that is unilateral likely having a stroke. -- MRI Brain   Multiple small acute ischemic infarcts within the right hemisphere affecting parts of the right parietal cortex, right temporal cortex, and the right occipital lobe, with punctate foci of acute ischemia in the right frontal white matter. 2. Multifocal  hyperintense T2-weighted signal within the cerebral white matter, most commonly due to chronic small vessel disease. --Pt outside the window of tPA. Neurology consulted, appreciate their recommendations.  Patient does endorse nonadherence to his chronic medications - Allow for permissive HTN in the setting of acute stroke  - ASA 81 mg daily and Brillinta bid for 21 days and then ASA 81 mg every day per Dr Matthews  - Echocardiogram --EF 45%. Bubble study neg -resumed crestor  - PT/OT--recommends HH--pt refused per TOC -- patient is very irritable and is insisting on discharge.   Hypertension: Permissive hypertension in setting of strokes --bp stable. Pt not on meds at home   Hyperlipidemia: Continue high intensity statin.    Type II Diabetes: On insulin  and metformin  --defer to PCP for out pt management   Psoriatic arthritis: on injectable once a month. Continue outpatient.   MDD/Insomnia: continue home seroquel   D/c home. Neurology aware        Consultants: neurology Procedures performed: none Disposition: home Diet recommendation:  Discharge Diet Orders (From admission, onward)     Start     Ordered   06/22/24 0000  Diet - low sodium heart healthy        06/22/24 1446   06/22/24 0000  Diet Carb Modified        06/22/24 1446   06/22/24 0000  Diet - low sodium heart healthy        06/22/24 1455   06/22/24 0000  Diet Carb Modified        06/22/24 1455  DISCHARGE MEDICATION: Allergies as of 06/22/2024       Reactions   Clopidogrel  Anaphylaxis   Weird sensation in legs        Medication List     STOP taking these medications    diazepam  2 MG tablet Commonly known as: Valium    escitalopram  10 MG tablet Commonly known as: Lexapro    Semaglutide  (2 MG/DOSE) 8 MG/3ML Sopn   zolpidem  10 MG tablet Commonly known as: AMBIEN        TAKE these medications    Adalimumab-ryvk (2 Pen) 40 MG/0.4ML Ajkt Inject into the skin.   aspirin  EC 81 MG  tablet Take 1 tablet (81 mg total) by mouth daily. Swallow whole. Start taking on: June 23, 2024 What changed:  medication strength how much to take additional instructions   Basaglar  KwikPen 100 UNIT/ML Inject 5 Units into the skin at bedtime. Titrate to Fasting Blood Sugar of 100- 150 mg/dL with morning blood sugar check.   Contour Next EZ w/Device Kit 1 each by Does not apply route as directed.   Contour Next Test test strip Generic drug: glucose blood 1 each by Other route 4 (four) times daily - after meals and at bedtime. Use as instructed   FreeStyle Libre 3 Plus Sensor Misc Change sensor every 15 days.   insulin  lispro 100 UNIT/ML KwikPen Commonly known as: HUMALOG  Only take 3 units with meals if BG is greater than 250 up to three times daily   metFORMIN  750 MG 24 hr tablet Commonly known as: GLUCOPHAGE -XR Take 1 tablet (750 mg total) by mouth 2 (two) times daily after a meal. What changed:  when to take this reasons to take this   QUEtiapine  100 MG tablet Commonly known as: SEROQUEL  Take 100 mg by mouth at bedtime.   rosuvastatin  20 MG tablet Commonly known as: CRESTOR  Take 1 tablet (20 mg total) by mouth every evening.   ticagrelor  90 MG Tabs tablet Commonly known as: BRILINTA  Take 1 tablet (90 mg total) by mouth 2 (two) times daily for 21 days.        Follow-up Information     Aletha Bene, MD Follow up.   Specialty: Family Medicine Why: hospital follow up Contact information: [address] [place] [address] [phone]         Aletha Bene, MD .   Specialty: [facility] Medicine Contact information: [address] [place] [address] [phone]                   Condition at discharge: fair  The results of significant diagnostics from this hospitalization (including imaging, microbiology, ancillary and laboratory) are listed below for reference.   Imaging Studies: ECHOCARDIOGRAM COMPLETE Result  Date: 06/22/2024    ECHOCARDIOGRAM REPORT   Patient Name:   [name] Date of Exam: 06/22/2024 Medical Rec #:  [id number]      Height:       68.0 in Accession #:    [id number]     Weight:       176.1 lb Date of Birth:  07/22/1960      BSA:          1.936 m Patient Age:    63 years       BP:           105/71 mmHg Patient Gender: M              HR:  92 bpm. Exam Location:  [facility] Procedure: 2D Echo, Cardiac Doppler, Color Doppler and Intracardiac            Opacification Agent (Both Spectral and Color Flow Doppler were            utilized during procedure). Indications:     Stroke I63.9  History:         Patient has prior history of Echocardiogram examinations, most                  recent 08/04/2023. Prior CABG; Stroke.  Sonographer:     Ashley McNeely-Sloane Referring Phys:  [id number] Brittish Bolinger Diagnosing Phys: Caron Poser IMPRESSIONS  1. Very technically difficult study.  2. Left ventricular ejection fraction, by estimation, is 45 to 50%. The left ventricle has mildly reduced function. The left ventricle demonstrates regional wall motion abnormalities . There is akinesis of the apical-mid anteroseptal and inferoseptal walls.     Left ventricular diastolic function could not be evaluated. No LV thrombus visualized.  3. Right ventricular systolic function was not well visualized. The right ventricular size is not well visualized.  4. The mitral valve was not well visualized. No evidence of mitral valve regurgitation. No evidence of mitral stenosis.  5. The aortic valve was not well visualized. Aortic valve regurgitation is not visualized. No aortic stenosis is present. Comparison(s): A prior study was performed on 08/04/2023. No significant change from prior study. FINDINGS  Left Ventricle: Left ventricular ejection fraction, by estimation, is 45 to 50%. The left ventricle has mildly decreased function. The left ventricle demonstrates regional wall motion abnormalities. Definity  contrast agent was given IV to  delineate the left ventricular endocardial borders. The left ventricular internal cavity size was normal in size. Suboptimal image quality limits for assessment of left ventricular hypertrophy. Left ventricular diastolic function could not be evaluated. Right Ventricle: The right ventricular size is not well visualized. Right vetricular wall thickness was not well visualized. Right ventricular systolic function was not well visualized. Left Atrium: Left atrial size was not well visualized. Right Atrium: Right atrial size was not well visualized. Pericardium: There is no evidence of pericardial effusion. Mitral Valve: The mitral valve was not well visualized. No evidence of mitral valve regurgitation. No evidence of mitral valve stenosis. MV peak gradient, 1.4 mmHg. The mean mitral valve gradient is 1.0 mmHg. Tricuspid Valve: The tricuspid valve is not well visualized. Tricuspid valve regurgitation is not demonstrated. No evidence of tricuspid stenosis. Aortic Valve: The aortic valve was not well visualized. Aortic valve regurgitation is not visualized. No aortic stenosis is present. Aortic valve mean gradient measures 2.0 mmHg. Aortic valve peak gradient measures 3.1 mmHg. Aortic valve area, by VTI measures 2.46 cm. Pulmonic Valve: The pulmonic valve was not well visualized. Pulmonic valve regurgitation is not visualized. No evidence of pulmonic stenosis. Aorta: The aortic root was not well visualized and the ascending aorta was not well visualized. IAS/Shunts: The interatrial septum was not well visualized.  LEFT VENTRICLE PLAX 2D LVIDd:         3.85 cm     Diastology LVIDs:         2.10 cm     LV e' medial:    5.00 cm/s LV PW:         1.30 cm     LV E/e' medial:  8.5 LV IVS:        1.45 cm     LV e' lateral:   7.94 cm/s LVOT diam:  1.80 cm     LV E/e' lateral: 5.3 LV SV:         35 LV SV Index:   18 LVOT Area:     2.54 cm  LV Volumes (MOD) LV vol d, MOD A2C: 39.4 ml LV vol d, MOD A4C: 80.8 ml LV vol s, MOD  A2C: 19.7 ml LV vol s, MOD A4C: 51.1 ml LV SV MOD A2C:     19.7 ml LV SV MOD A4C:     80.8 ml LV SV MOD BP:      24.2 ml RIGHT VENTRICLE RV Basal diam:  4.20 cm RV S prime:     4.24 cm/s TAPSE (M-mode): 1.6 cm LEFT ATRIUM             Index        RIGHT ATRIUM           Index LA Vol (A2C):   40.4 ml 20.86 ml/m  RA Area:     13.50 cm LA Vol (A4C):   42.7 ml 22.05 ml/m  RA Volume:   33.00 ml  17.04 ml/m LA Biplane Vol: 44.2 ml 22.83 ml/m  AORTIC VALVE                    PULMONIC VALVE AV Area (Vmax):    2.54 cm     RVOT Peak grad: 3 mmHg AV Area (Vmean):   2.32 cm AV Area (VTI):     2.46 cm AV Vmax:           88.70 cm/s AV Vmean:          61.500 cm/s AV VTI:            0.144 m AV Peak Grad:      3.1 mmHg AV Mean Grad:      2.0 mmHg LVOT Vmax:         88.70 cm/s LVOT Vmean:        56.100 cm/s LVOT VTI:          0.139 m LVOT/AV VTI ratio: 0.97  AORTA Ao Root diam: 2.90 cm MITRAL VALVE MV Area (PHT): 4.49 cm    SHUNTS MV Area VTI:   3.43 cm    Systemic VTI:  0.14 m MV Peak grad:  1.4 mmHg    Systemic Diam: 1.80 cm MV Mean grad:  1.0 mmHg    Pulmonic VTI:  0.133 m MV Vmax:       0.59 m/s MV Vmean:      35.8 cm/s MV Decel Time: 169 msec MV E velocity: 42.40 cm/s MV A velocity: 55.70 cm/s MV E/A ratio:  0.76 Caron Poser Electronically signed by Caron Poser Signature Date/Time: 06/22/2024/1:31:38 PM    Final    MR BRAIN WO CONTRAST Result Date: 06/21/2024 EXAM: MRI BRAIN WITHOUT CONTRAST 06/21/2024 09:03:21 PM TECHNIQUE: Multiplanar multisequence MRI of the head/brain was performed without the administration of intravenous contrast. COMPARISON: None available. CLINICAL HISTORY: Stroke, follow up. FINDINGS: BRAIN AND VENTRICLES: Multifocal acute ischemia within the right hemisphere affecting parts of the right parietal cortex, right temporal cortex, and the right occipital lobe. There are punctate foci of acute ischemia in the right frontal white matter. Multifocal hyperintense T2-weighted signal within the  cerebral white matter, most commonly due to chronic small vessel disease. Multiple old infarcts of the corona radiata. No intracranial hemorrhage. No mass. No midline shift. No hydrocephalus. The sella is unremarkable. Normal flow voids. ORBITS: No acute abnormality. SINUSES AND  MASTOIDS: No acute abnormality. BONES AND SOFT TISSUES: Normal marrow signal. No acute soft tissue abnormality. IMPRESSION: 1. Multiple small acute ischemic infarcts within the right hemisphere affecting parts of the right parietal cortex, right temporal cortex, and the right occipital lobe, with punctate foci of acute ischemia in the right frontal white matter. 2. Multifocal hyperintense T2-weighted signal within the cerebral white matter, most commonly due to chronic small vessel disease. Electronically signed by: Franky Stanford MD 06/21/2024 09:47 PM EST RP Workstation: [id number]   DG Chest Port 1 View Result Date: 06/21/2024 CLINICAL DATA:  CVA. Order comments state looking for pacemaker lead for MRI. EXAM: PORTABLE CHEST 1 VIEW COMPARISON:  Chest radiograph 10/26/2023 FINDINGS: Median sternotomy.The cardiomediastinal contours are normal. Coronary stent. No pacemaker or evidence of epicardial leads demonstrated. Left chest wall loop recorder. Pulmonary vasculature is normal. No consolidation, pleural effusion, or pneumothorax. No acute osseous abnormalities are seen. IMPRESSION: 1. No acute chest findings. 2. Median sternotomy and coronary stent. Left chest wall loop recorder. No pacemaker or epicardial leads demonstrated. Electronically Signed   By: Andrea Gasman M.D.   On: 06/21/2024 19:20   CT ANGIO HEAD NECK W WO CM W PERF (CODE STROKE) LKW > 6h Result Date: 06/21/2024 EXAM: CTA Head and Neck with Perfusion 06/21/2024 04:12:53 PM TECHNIQUE: CTA of the head and neck was performed without and with the administration of 100 mL of iohexol  (OMNIPAQUE ) 350 MG/ML injection. 3D postprocessing with multiplanar reconstructions and MIPs  was performed to evaluate the vascular anatomy. Cerebral perfusion analysis using computed tomography with contrast administration, including post-processing of parametric maps with determination of cerebral blood flow, cerebral blood volume, mean transit time and time-to-maximum. Automated exposure control, iterative reconstruction, and/or weight based adjustment of the mA/kV was utilized to reduce the radiation dose to as low as reasonably achievable. COMPARISON: CTA head and neck 10/19/2020 CLINICAL HISTORY: Neuro deficit, acute, stroke suspected. Left sided facial deficit, left grip weakness, and difficulty walking with a fall. FINDINGS: CTA NECK: AORTIC ARCH AND ARCH VESSELS: No dissection or arterial injury. No significant stenosis of the brachiocephalic or subclavian arteries. CERVICAL CAROTID ARTERIES: Small amount of calcified plaque at the right carotid bifurcation and small amount of soft plaque at the left carotid bifurcation not resulting in a 50% or greater stenosis. CERVICAL VERTEBRAL ARTERIES: Mildly dominant left vertebral artery. No dissection, arterial injury, or significant stenosis. LUNGS AND MEDIASTINUM: Unremarkable. SOFT TISSUES: No acute abnormality. BONES: Moderate cervical spondylosis. CTA HEAD: ANTERIOR CIRCULATION: The intracranial internal carotid arteries are patent with mildly progressive siphon atherosclerosis resulting in moderate right and mild left cavernous segment stenoses. ACAs and MCAs are patent with progressive branch vessel irregularity but no evidence of a proximal branch occlusion or flow limiting proximal stenosis. No aneurysm. POSTERIOR CIRCULATION: The intracranial vertebral arteries are widely patent to the basilar. Patent AICA and SCA origins are visualized bilaterally. The basilar artery is widely patent. Posterior communicating arteries are diminutive or absent. Both PCAs are patent without evidence of a significant proximal stenosis on the right. There is a severe  proximal to mid left P2 stenosis, similar to prior. No aneurysm. OTHER: No dural venous sinus thrombosis on this non-dedicated study. CT PERFUSION: EXAM QUALITY: Exam quality is adequate with diagnostic perfusion maps. No significant motion artifact. Appropriate arterial inflow and venous outflow curves. CORE INFARCT (CBF<30% volume): 0 mL TOTAL HYPOPERFUSION (Tmax>6s volume): 0 mL PENUMBRA: Mismatch volume: 0 mL Mismatch ratio: not applicable Location: not applicable IMPRESSION: 1. No large vessel occlusion or evidence  of acute ischemia on CT perfusion. 2. Mildly progressive intracranial atherosclerosis including moderate right and mild left ICA stenoses. 3. Unchanged severe left P2 stenosis. 4. These results were communicated to Dr. KYM Ross at 4:17 PM on 06/21/2024 by secure text page via the [facility] messaging system. Electronically signed by: Dasie Hamburg MD 06/21/2024 04:52 PM EST RP Workstation: [id number]   CT HEAD CODE STROKE WO CONTRAST (LKW 0-4.5h, LVO 0-24h) Result Date: 06/21/2024 EXAM: CT HEAD WITHOUT CONTRAST TECHNIQUE: CT of the head was performed without the administration of intravenous contrast. Automated exposure control, iterative reconstruction, and/or weight based adjustment of the mA/kV was utilized to reduce the radiation dose to as low as reasonably achievable. COMPARISON: Head CT and MRI 03/17/2022. CLINICAL HISTORY: Neuro deficit, acute, stroke suspected. Left sided facial deficit, left grip weakness, and difficulty walking with a fall. FINDINGS: BRAIN AND VENTRICLES: There is no evidence of an acute infarct, intracranial hemorrhage, mass, midline shift, hydrocephalus, or extra-axial fluid collection. There is mild cerebral atrophy. A chronic periventricular infarct is again noted along the posterior body and atrium of the left lateral ventricle with mild ex vacuo ventricular enlargement. A chronic lacunar infarct in the left caudate head is unchanged. A lacunar infarct in the right  frontal periventricular white matter is new but also chronic in appearance. Mild hypodensities elsewhere in the cerebral white matter bilaterally are nonspecific but compatible with chronic small vessel ischemic disease. Calcified atherosclerosis at the skull base. ORBITS: No acute abnormality. SINUSES: No acute abnormality. SOFT TISSUES AND SKULL: No acute soft tissue abnormality. No skull fracture. [place] Stroke Program Early CT Score (ASPECTS) ----- Ganglionic (caudate, IC, lentiform nucleus, insula, M1-M3): 7 Supraganglionic (M4-M6): 3 Total: 10 IMPRESSION: 1. No acute intracranial abnormality. ASPECTS of 10. 2. Chronic small vessel ischemia with old infarcts as above. 3. These results were communicated to Dr. KYM Ross at 3:56 PM on June 21, 2024 by secure text page via the [facility] messaging system. Electronically signed by: Dasie Hamburg MD 06/21/2024 03:57 PM EST RP Workstation: [id number]   CUP PACEART REMOTE DEVICE CHECK Result Date: 05/29/2024 ILR summary report received. Battery status OK. Normal device function. No new symptom, tachy, brady, or pause episodes. No new AF episodes. Monthly summary reports and ROV/PRN LA, CVRS   Microbiology: Results for orders placed or performed during the hospital encounter of 09/15/23  Surgical pcr screen     Status: None   Collection Time: 09/15/23  2:44 PM   Specimen: Nasal Mucosa; Nasal Swab  Result Value Ref Range Status   MRSA, PCR NEGATIVE NEGATIVE Final   Staphylococcus aureus NEGATIVE NEGATIVE Final    Comment: (NOTE) The Xpert SA Assay (FDA approved for NASAL specimens in patients 8 years of age and older), is one component of a comprehensive surveillance program. It is not intended to diagnose infection nor to guide or monitor treatment. Performed at [facility], [address], [place], [place] [address]     Labs: CBC: Recent Labs  Lab 06/21/24 1530  WBC 19.3*  NEUTROABS 16.1*  HGB 14.9  HCT 42.4  MCV 92.0  PLT 279    Basic Metabolic Panel: Recent Labs  Lab 06/21/24 1530  NA 140  K 3.3*  CL 104  CO2 21*  GLUCOSE 71  BUN 10  CREATININE 1.14  CALCIUM  9.7   Liver Function Tests: Recent Labs  Lab 06/21/24 1530  AST 97*  ALT 21  ALKPHOS 54  BILITOT 1.3*  PROT 7.1  ALBUMIN  4.2  CBG: Recent Labs  Lab 06/21/24 1529 06/21/24 2319 06/22/24 0751 06/22/24 1134  GLUCAP 84 135* 95 158*    Discharge time spent: greater than 30 minutes.  Signed: Leita Blanch, MD [facility] 06/22/2024

## 2024-06-22 NOTE — TOC CM/SW Note (Signed)
 Transition of Care [facility]) - Inpatient Brief Assessment   Patient Details  Name: [name] MRN: [id number] Date of Birth: 09-28-1960  Transition of Care [facility]) CM/SW Contact:    Daved JONETTA Hamilton, RN Phone Number: 06/22/2024, 3:24 PM   Clinical Narrative:   Transition of Care [facility]) Screening Note   Patient Details  Name: [name] Date of Birth: Jul 05, 1961   Transition of Care [facility]) CM/SW Contact:    Daved JONETTA Hamilton, RN Phone Number: 06/22/2024, 3:24 PM   Met with patient, introduced self and explained role in discharge planning and therapy recommendations for [facility] once medically ready for discharge. Patient verbalized in an elevated tone that he has already told everyone that comes in here that he will not have anyone coming out to his home. Patient verbalized that it has been strongly suggested that someone come out to my house to help me because I live alone but he doesn't want anyone coming to his house. Patient verbalized he didn't want someone to come to his house and let his cat(s) out. Patient appeared agitated, CM verbalized to patient I would note that he does not want home assistance.   No TOC needs at this time, if new patient transition needs arise, please place a TOC consult.   Transition of Care Asessment: Insurance and Status: Insurance coverage has been reviewed Patient has primary care physician: Yes   Prior level of function:: independent Prior/Current Home Services: No current home services Social Drivers of Health Review: SDOH reviewed interventions complete Readmission risk has been reviewed: No (patient in observation status, no score generated) Transition of care needs: no transition of care needs at this time

## 2024-06-22 NOTE — Evaluation (Signed)
 Occupational Therapy Evaluation Patient Details Name: [name] MRN: [id number] DOB: 03/10/1961 Today's Date: 06/22/2024   History of Present Illness   Pt is a 63 y.o. male who presented to the ED reporting left-sided weakness, R homonymous hemianopsia and left sided numbness and neglect since last night 2/2 acute ischemic stroke. MRI findings:  Multiple small acute ischemic infarcts within the right hemisphere affecting parts of the right parietal and temporal cortex, and the right occipital lobe, with punctate foci of acute ischemia in the right frontal white matter. PMH of hypertension, hyperlipidemia, type 2 diabetes, CAD status post CABG     Clinical Impressions Pt was seen for OT evaluation this date. PTA, pt resides at home alone and is independent with all tasks. Reports use of 3 wheeled walker for ambulation. Ex-fiance lives in the area, but he reports he will not require caregiver supoport or accept it. Pt is easily agitated during session requiring increased encouragement to participate. Declines all formal assessments, but agreeable to ambulation performed with his person 3ww and CGA/SBA with steady gait, no LOB or running into items in the hallway. Able to read hallway signs on both sides, questionable L sided peripheral vision. Reports no sensation to light touch on LUE or LLE at this time. Also reports poor LUE coordination, but declined formal assessments, able to functionally hold walker and stair railings during session. He adamantly declines caregiver assist/support and wishes to return home. Recommend HHOT services on DC and will follow acutely to maximize return to PLOF.      If plan is discharge home, recommend the following:   A little help with walking and/or transfers;A little help with bathing/dressing/bathroom;Help with stairs or ramp for entrance;Assistance with cooking/housework     Functional Status Assessment   Patient has had a recent decline in their  functional status and demonstrates the ability to make significant improvements in function in a reasonable and predictable amount of time.     Equipment Recommendations   None recommended by OT     Recommendations for Other Services         Precautions/Restrictions   Precautions Precautions: Fall Restrictions Weight Bearing Restrictions Per Provider Order: No     Mobility Bed Mobility               General bed mobility comments: NT up in recliner pre/post session    Transfers Overall transfer level: Needs assistance Equipment used: Rollator (4 wheels) (3 wheeled walker) Transfers: Sit to/from Stand Sit to Stand: Contact guard assist, Supervision           General transfer comment: utilized his personal 3WW to ambulate throughout the hallway and perform stair training with CGA/SBA and no LOB noted      Balance Overall balance assessment: Needs assistance Sitting-balance support: Feet supported Sitting balance-Leahy Scale: Good     Standing balance support: During functional activity, Reliant on assistive device for balance Standing balance-Leahy Scale: Fair Standing balance comment: able to utilize his 3ww without difficulty                           ADL either performed or assessed with clinical judgement   ADL Overall ADL's : Needs assistance/impaired   Eating/Feeding Details (indicate cue type and reason): pt not willing to perform, but reported difficulty, muffin fell apart all over his tray  Functional mobility during ADLs: Contact guard assist;Rolling walker (2 wheels) General ADL Comments: pt agitated, declined MMT and a lot of tasks, but willing to ambulate with PT/OT     Vision   Vision Assessment?: Vision impaired- to be further tested in functional context Additional Comments: able to read wall signs on both L and R side of the hallway during ambulation, did not bump into  things in the hallway, questionable L sided peripheral vision     Perception         Praxis         Pertinent Vitals/Pain Pain Assessment Pain Assessment: No/denies pain     Extremity/Trunk Assessment Upper Extremity Assessment Upper Extremity Assessment: Generalized weakness;Left hand dominant;LUE deficits/detail LUE Deficits / Details: anticipate 3+/5 strength in LUE, however refused MMT; reports poor coordination, but able to hold walker and stair railings; reports no sensation throughout LUE from shoulder throughout LUE Sensation: decreased light touch;decreased proprioception LUE Coordination: decreased fine motor;decreased gross motor   Lower Extremity Assessment Lower Extremity Assessment: Defer to PT evaluation;Generalized weakness LLE Sensation: decreased light touch LLE Coordination: decreased fine motor   Cervical / Trunk Assessment Cervical / Trunk Assessment: Normal   Communication Communication Communication: No apparent difficulties   Cognition Arousal: Alert Behavior During Therapy: Agitated, WFL for tasks assessed/performed                                 Following commands: Intact       Cueing  General Comments   Cueing Techniques: Verbal cues      Exercises Other Exercises Other Exercises: Edu on role of OT in acute setting, DC recommendations and to utilize LUE dominant hand with all tasks to promote improved sensation, strength and coordination. Again, pt declined formal assessments.   Shoulder Instructions      Home Living Family/patient expects to be discharged to:: Private residence Living Arrangements: Alone Available Help at Discharge: Family;Available PRN/intermittently (ex fiancee) Type of Home: House Home Access: Stairs to enter [employer] of Steps: 2 reports use of door frame Entrance Stairs-Rails: None Home Layout: One level     Bathroom Shower/Tub: Producer, Television/film/video:  Standard Bathroom Accessibility: Yes   Home Equipment: Agricultural Consultant (2 wheels);Rollator (4 wheels);BSC/3in1;Shower seat          Prior Functioning/Environment Prior Level of Function : Independent/Modified Independent;Working/employed;Driving             Mobility Comments: indep without AD ADLs Comments: indep    OT Problem List: Decreased strength;Decreased coordination;Impaired vision/perception;Decreased safety awareness;Impaired sensation   OT Treatment/Interventions: Self-care/ADL training;Therapeutic exercise;Therapeutic activities;Neuromuscular education;DME and/or AE instruction;Balance training;Patient/family education;Visual/perceptual remediation/compensation      OT Goals(Current goals can be found in the care plan section)   Acute Rehab OT Goals Patient Stated Goal: go home OT Goal Formulation: With patient Time For Goal Achievement: 07/06/24 Potential to Achieve Goals: Good ADL Goals Pt Will Perform Lower Body Dressing: with modified independence;sit to/from stand;sitting/lateral leans Pt/caregiver will Perform Home Exercise Program: Increased strength;Left upper extremity;Independently Additional ADL Goal #1: Pt will demo implementation of LUE into all safe functional tasks to promote improved sensation and coordination for safe return home.   OT Frequency:  Min 2X/week    Co-evaluation PT/OT/SLP Co-Evaluation/Treatment: Yes Reason for Co-Treatment: Complexity of the patient's impairments (multi-system involvement);Necessary to address cognition/behavior during functional activity (easily agitated) PT goals addressed during session: Mobility/safety with mobility;Balance;Proper use of DME  OT goals addressed during session: ADL's and self-care;Strengthening/ROM      AM-PAC OT 6 Clicks Daily Activity     Outcome Measure Help from another person eating meals?: A Little Help from another person taking care of personal grooming?: A Little Help from  another person toileting, which includes using toliet, bedpan, or urinal?: A Little Help from another person bathing (including washing, rinsing, drying)?: A Little Help from another person to put on and taking off regular upper body clothing?: A Little Help from another person to put on and taking off regular lower body clothing?: A Little 6 Click Score: 18   End of Session Equipment Utilized During Treatment: Rolling walker (2 wheels) (personal [id number]) Nurse Communication: Mobility status  Activity Tolerance: Patient tolerated treatment well Patient left: in chair;with call bell/phone within reach  OT Visit Diagnosis: Other abnormalities of gait and mobility (R26.89);Muscle weakness (generalized) (M62.81)                Time: 9085-9061 OT Time Calculation (min): 24 min Charges:  OT General Charges $OT Visit: 1 Visit OT Evaluation $OT Eval Moderate Complexity: 1 Mod Bradley Handyside Chrismon, OTR/L 06/22/24, 12:20 PM  Elta Angell E Chrismon 06/22/2024, 12:16 PM

## 2024-06-22 NOTE — Consult Note (Signed)
 NEUROLOGY CONSULT NOTE   Date of service: 06/21/24 Patient Name: [name] MRN:  [id number] DOB:  1961-06-01 Chief Complaint: stroke code Requesting Provider: Dr. Nicholaus  History of Present Illness  Tag Wurtz is a 63 y.o. male with hx of hypertension, hyperlipidemia, type 2 diabetes, CAD status post CABG who presented to the ED reporting left-sided weakness that began the day prior.  On examination patient was able to hold all extremities against gravity however had severe sensory loss and neglect on the left side.  TNK was not considered due to last known well last night.  CT head showed no acute process.  CTA showed no LVO.  CTP notes showed no perfusion deficit.  Stroke code paged out 1551 Neurologist at bedside 1556  LKW: last night Modified rankin score: 2-Slight disability-UNABLE to perform all activities but does not need assistance IV Thrombolysis: no, outside window  NIHSS components Score: Comment  1a Level of Conscious 0[x]  1[]  2[]  3[]      1b LOC Questions 0[x]  1[]  2[]       1c LOC Commands 0[x]  1[]  2[]       2 Best Gaze 0[x]  1[]  2[]       3 Visual 0[]  1[]  2[x]  3[]      4 Facial Palsy 0[]  1[x]  2[]  3[]      5a Motor Arm - left 0[x]  1[]  2[]  3[]  4[]  UN[]    5b Motor Arm - Right 0[x]  1[]  2[]  3[]  4[]  UN[]    6a Motor Leg - Left 0[x]  1[]  2[]  3[]  4[]  UN[]    6b Motor Leg - Right 0[x]  1[]  2[]  3[]  4[]  UN[]    7 Limb Ataxia 0[x]  1[]  2[]  UN[]      8 Sensory 0[]  1[]  2[x]  UN[]      9 Best Language 0[x]  1[]  2[]  3[]      10 Dysarthria 0[]  1[x]  2[]  UN[]      11 Extinct. and Inattention 0[]  1[x]  2[]       TOTAL:  7      ROS   Comprehensive ROS performed and pertinent positives documented in HPI   Past History   Past Medical History:  Diagnosis Date   Anemia 10/03/2009   Qualifier: Diagnosis of  By: Georgian ROSALEA CHARM Lamar    Anxiety associated with depression 10/11/2013   pt states he is no longer having this issue because he is happy with his job now   Colon cancer [facility])    right  colon cancer- adenocarcinoma CEA level isnrmal at 1.8   Coronary artery disease    Diabetes mellitus    Diabetes mellitus type 2 in obese 03/20/2010   Qualifier: Diagnosis of  By: Georgian ROSALEA CHARM Lamar     Erectile dysfunction 06/22/2016   Essential hypertension 06/05/2014   FATTY LIVER DISEASE 11/04/2009   Qualifier: Diagnosis of  By: Abran MD, Norleen SAILOR   Qualifier: Diagnosis of  By: Abran MD, Norleen SAILOR  Last Assessment & Plan:  conirmed by CT scan of abdomen in April 2016 encouraged to minimize simple carbs and fatty foods.   GERD 11/04/2009   Qualifier: Diagnosis of  By: Abran MD, Norleen SAILOR    Great toe pain, right 04/18/2017   Hepatic artery stenosis    History of colon cancer 01/02/2010   Qualifier: Diagnosis of  By: Georgian ROSALEA CHARM Lamar Dr Abran  Last Assessment & Plan:  Follows closely with gastroenterology, Dr Abran   Hyperlipidemia    Hyperlipidemia, mixed 09/07/2010   Qualifier: Diagnosis of  By: Georgian ROSALEA CHARM Lamar  Hypertension    Hypertriglyceridemia 12/14/2010   Hypotestosteronism 06/22/2016   INSOMNIA, CHRONIC 03/20/2010   Qualifier: Diagnosis of  By: Georgian ROSALEA CHARM Lamar Ambien  10 mg daily does not keep asleep AdvilPM is over sedating and has trouble waking up    Iron  deficiency anemia 2011   Low testosterone  06/22/2016   Malignant neoplasm of colon (HCC) 01/02/2010   Qualifier: Diagnosis of  By: Georgian ROSALEA CHARM Lamar Dr Abran    Muscle spasm of back 06/05/2014   Nipple pain 09/30/2016   Obesity    Palpitations 06/05/2014   Preventative health care 10/08/2012   Psoriasis    Psoriatic arthritis (HCC) 08/25/2017   Right knee pain 11/17/2011   Stroke [facility])     Past Surgical History:  Procedure Laterality Date   BUBBLE STUDY  10/21/2020   Procedure: BUBBLE STUDY;  Surgeon: Mona Vinie BROCKS, MD;  Location: [facility] ENDOSCOPY;  Service: Cardiovascular;;   CARPAL TUNNEL RELEASE     RIGHT   CHOLECYSTECTOMY  1994   COLONOSCOPY     CORONARY ARTERY BYPASS GRAFT N/A 09/19/2023   Procedure:  CORONARY ARTERY BYPASS GRAFTING (CABG) TIMES TWO USING LEFT INTERNAL MAMMARY ARTERY AND ENDOSCOPICALLY HARVESTED RIGHT GREATER SAPHENOUS VEIN;  Surgeon: Lucas Dorise POUR, MD;  Location: [facility] OR;  Service: Open Heart Surgery;  Laterality: N/A;   CORONARY/GRAFT ACUTE MI REVASCULARIZATION N/A 10/27/2019   Procedure: Coronary/Graft Acute MI Revascularization;  Surgeon: Darron Deatrice LABOR, MD;  Location: [facility] INVASIVE CV LAB;  Service: Cardiovascular;  Laterality: N/A;   HEMICOLECTOMY     12/17/2009 right   LEFT HEART CATH AND CORONARY ANGIOGRAPHY N/A 10/27/2019   Procedure: LEFT HEART CATH AND CORONARY ANGIOGRAPHY;  Surgeon: Darron Deatrice LABOR, MD;  Location: [facility] INVASIVE CV LAB;  Service: Cardiovascular;  Laterality: N/A;   LOOP RECORDER INSERTION N/A 10/21/2020   Procedure: LOOP RECORDER INSERTION;  Surgeon: Kelsie Agent, MD;  Location: [facility] INVASIVE CV LAB;  Service: Cardiovascular;  Laterality: N/A;   POLYPECTOMY     RIGHT/LEFT HEART CATH AND CORONARY ANGIOGRAPHY Bilateral 08/12/2023   Procedure: RIGHT/LEFT HEART CATH AND CORONARY ANGIOGRAPHY;  Surgeon: Darron Deatrice LABOR, MD;  Location: [facility] INVASIVE CV LAB;  Service: Cardiovascular;  Laterality: Bilateral;   TEE WITHOUT CARDIOVERSION N/A 10/21/2020   Procedure: TRANSESOPHAGEAL ECHOCARDIOGRAM (TEE);  Surgeon: Mona Vinie BROCKS, MD;  Location: [facility] ENDOSCOPY;  Service: Cardiovascular;  Laterality: N/A;   TEE WITHOUT CARDIOVERSION N/A 09/19/2023   Procedure: TRANSESOPHAGEAL ECHOCARDIOGRAM (TEE);  Surgeon: Lucas Dorise POUR, MD;  Location: [facility] OR;  Service: Open Heart Surgery;  Laterality: N/A;    Family History: Family History  Problem Relation Age of Onset   Stomach cancer Mother        diedinher 48's   Diabetes type II Brother        boderline   Diabetes Brother        type II   Alcohol abuse Brother    Other Neg Hx        cad,prostate ca, colon ca   Coronary artery disease Neg Hx    Cancer Neg Hx        colon, prostate   Colon cancer Neg Hx     Esophageal cancer Neg Hx    Rectal cancer Neg Hx     Social History  reports that he quit smoking about 11 months ago. His smoking use included cigarettes. He has never used smokeless tobacco. He reports that he does not drink alcohol and does not use drugs.  Allergies  Allergen Reactions   Clopidogrel  Anaphylaxis    Weird sensation in legs     Medications   Current Facility-Administered Medications:     stroke: early stages of recovery book, , Does not apply, Once, Fernand Prost, MD   aspirin  EC tablet 81 mg, 81 mg, Oral, Daily, Fernand Prost, MD, 81 mg at 06/29/24 2208   enoxaparin  (LOVENOX ) injection 40 mg, 40 mg, Subcutaneous, Q24H, Fernand Prost, MD   ferrous sulfate  tablet 325 mg, 325 mg, Oral, Q breakfast, Fernand Prost, MD   insulin  aspart (novoLOG ) injection 0-15 Units, 0-15 Units, Subcutaneous, TID WC, Fernand Prost, MD   insulin  aspart (novoLOG ) injection 0-5 Units, 0-5 Units, Subcutaneous, QHS, Fernand Prost, MD   QUEtiapine  (SEROQUEL ) tablet 100 mg, 100 mg, Oral, QHS, Khan, Ghalib, MD, 100 mg at 06/29/24 2206   ticagrelor  (BRILINTA ) tablet 90 mg, 90 mg, Oral, BID, Fernand Prost, MD, 90 mg at 06/29/24 2206  Vitals   Vitals:   June 29, 2024 1845 06-29-24 2126 06/22/24 0123 06/22/24 0521  BP: 129/88 131/77 111/69 105/71  Pulse: 99 87 86 85  Resp:  17 16 16   Temp: 97.7 F (36.5 C) 98.9 F (37.2 C) (!) 97.5 F (36.4 C) (!) 97.5 F (36.4 C)  TempSrc: Oral Oral    SpO2:  100% 97% 98%  Weight:      Height:        Body mass index is 26.78 kg/m.   Physical Exam    Gen: patient lying in bed, NAD CV: extremities appear well-perfused Resp: normal WOB  Neurologic exam MS: alert, oriented x4, follows commands Speech: mild dysarthria, no aphasia CN: PERRL, R homonymous hemianopsia, EOMI, sensation intact, L NLF flattening, hearing intact to voice Motor: no drift in any extremity Sensory: insensate LUE and LLE Reflexes: 1+ symm with toes down bilat Coordination: FNF  intact bilat Gait: deferred   Labs/Imaging/Neurodiagnostic studies   CBC:  Recent Labs  Lab 06-29-2024 1530  WBC 19.3*  NEUTROABS 16.1*  HGB 14.9  HCT 42.4  MCV 92.0  PLT 279   Basic Metabolic Panel:  Lab Results  Component Value Date   NA 140 2024-06-29   K 3.3 (L) 29-Jun-2024   CO2 21 (L) Jun 29, 2024   GLUCOSE 71 Jun 29, 2024   BUN 10 06/29/2024   CREATININE 1.14 June 29, 2024   CALCIUM  9.7 2024-06-29   GFRNONAA >60 June 29, 2024   GFRAA >60 11/05/2019   Lipid Panel:  Lab Results  Component Value Date   LDLCALC 96 2024-06-29   HgbA1c:  Lab Results  Component Value Date   HGBA1C 6.6 (H) 03/21/2024   Urine Drug Screen:     Component Value Date/Time   LABOPIA NONE DETECTED 10/19/2020 0800   COCAINSCRNUR NONE DETECTED 10/19/2020 0800   LABBENZ POSITIVE (A) 10/19/2020 0800   AMPHETMU NONE DETECTED 10/19/2020 0800   THCU NONE DETECTED 10/19/2020 0800   LABBARB NONE DETECTED 10/19/2020 0800    Alcohol Level No results found for: [facility] INR  Lab Results  Component Value Date   INR 1.1 29-Jun-2024   APTT  Lab Results  Component Value Date   APTT 26 Jun 29, 2024   AED levels: No results found for: PHENYTOIN, ZONISAMIDE, LAMOTRIGINE, LEVETIRACETA  CT Head without contrast(Personally reviewed): No acute process  CT angio Head and Neck with contrast(Personally reviewed): No LVO   ASSESSMENT   Ayven Pheasant is a 63 y.o. male with hx of hypertension, hyperlipidemia, type 2 diabetes, CAD status post CABG who presented to the ED with R homonymous hemianopsia and left  sided numbness and neglect since last night c/f acute ischemic stroke.  RECOMMENDATIONS   - Admit for stroke workup - Permissive HTN x48 hrs from sx onset or until stroke ruled out by MRI goal BP <220/110. PRN labetalol or hydralazine if BP above these parameters. Avoid oral antihypertensives. - MRI brain wo contrast - TTE w/ bubble - Check A1c and LDL + add statin per guidelines - ASA 81mg   daily + plavix  75mg  daily x21 days f/b ASA 81mg  daily monotherapy after that - q4 hr neuro checks - STAT head CT for any change in neuro exam - Tele - PT/OT/SLP - Stroke education - Amb referral to neurology upon discharge   Will continue to follow  ______________________________________________________________________    Signed, Elida CHRISTELLA Ross, MD [facility]

## 2024-06-22 NOTE — Discharge Instructions (Signed)
 Patient recommended to follow-up with his primary care physician, cardiology patient also recommended to keep log of sugars at home take his meds as prescribed

## 2024-06-24 ENCOUNTER — Ambulatory Visit: Payer: Self-pay | Admitting: Family Medicine

## 2024-06-25 ENCOUNTER — Encounter: Payer: Self-pay | Admitting: Neurology

## 2024-06-25 ENCOUNTER — Ambulatory Visit: Admitting: Neurology

## 2024-06-25 ENCOUNTER — Telehealth: Payer: Self-pay

## 2024-06-25 ENCOUNTER — Inpatient Hospital Stay: Admitting: Family Medicine

## 2024-06-25 VITALS — BP 122/73 | HR 83 | Ht 68.0 in | Wt 179.4 lb

## 2024-06-25 DIAGNOSIS — I639 Cerebral infarction, unspecified: Secondary | ICD-10-CM | POA: Diagnosis not present

## 2024-06-25 DIAGNOSIS — R29818 Other symptoms and signs involving the nervous system: Secondary | ICD-10-CM

## 2024-06-25 DIAGNOSIS — I6601 Occlusion and stenosis of right middle cerebral artery: Secondary | ICD-10-CM | POA: Diagnosis not present

## 2024-06-25 NOTE — Progress Notes (Signed)
 [facility] [address] [place]. [place] [address] [phone]       OFFICE CONSULT NOTE  [name] Date of Birth:  1960/09/24 Medical Record Number:  [id number]   Referring MD: [name]  Reason for Referral: Stroke  HPI: [name] is a 63 year old Caucasian male seen today for initial office consultation visit for stroke.  He is accompanied by a lady friend.  History is obtained from them and review of electronic medical records.  I personally reviewed pertinent available imaging films in PACS. He has past medical history of diabetes, hyperlipidemia, hypertension, coronary artery disease status post CABG who presented to [facility] on 06/21/2024 with left-sided weakness that began the day prior.  Patient was last seen well in the evening the day prior.  He states he developed left leg weakness and fell.  Did not seek help right away.  Presented with left-sided weakness and numbness.  CT head on admission showed no acute abnormality.  CT angiogram showed no large vessel occlusion but showed moderate right greater than left cavernous carotid stenosis.  CT perfusion showed no core infarct or perfusion deficit.  MRI scan showed patchy right parietal temporal and occipital MCA branch infarcts.  Multiple old corona radiata infarcts were noted as well.  Echocardiogram showed ejection fraction of 45 to 50% with akinesis of apical and mid septal wall.  LDL cholesterol was 96 mg percent.  Hemoglobin A1c was 6.1.  Loop recorder interrogation did not reveal any paroxysmal A-fib.  Patient was on aspirin  alone and Brilinta  was added.  He was discharged home but states he is not doing well.  He still has significant left-sided sensory loss.  He is walking with a wheeled walker.he did not have have any therapy orders and is skeptical of therapy as he states it did not help him with his last stroke. Prior office visit 01/22/2021, [name] returns for stroke follow-up and to further discuss  Social Security disability.  He attempted to return back to work on 6/24 but unfortunately was let go on 7/1 due to making mistakes per patient report.  He continued to struggle with imbalance and gait difficulties which he reports led to making mistakes.  He is currently in the process of applying for Social Security disability as he feels he will have great difficulty performing majority of job duties due to residual gait impairment with imbalance, fatigue with activity intolerance, short-term memory complaints, poststroke depression and anxiety as well as history of prior MI.  He reports today that he did not feel completely ready to return back to work and needed but that he had no choice due to financial reasons.  He has been able to maintain ADLs and IADLs at home with occasional difficulty.  Continues to struggle with depression and anxiety and feels as though this has been worsening of recently -PCP increase sertraline  to 50mg  daily since prior visit but he will only take on occasion.  He ensures that he takes Brilinta , aspirin  and one of my BP meds everyday as he will place these in a container but he does not always routinely take his other medications. Blood pressure today 109/64 -does not routinely monitor.  Loop recorder has not shown atrial fibrillation thus far.  No further concerns at this time.       History provided for reference purposes only Initial visit 11/10/2020 JM: [name] is being seen for hospital follow-up accompanied by his friend, [name]   Reports residual imbalance, right sided  weakness, memory loss, and blurred vision  Per patient, was told he would likely not benefit from [facility] therapies as he was able to maintain ADLs independently -he does report doing daily exercises at home -currently using cane and denies any recent falls Reports increased anxiety with recent stroke with residual deficits and lack of ability to return to work- use of diazepam  per PCP but not beneficial -  requesting a different medication to further help with this Able to maintain ADLs and IADLs independently.  He has not returned back to work or driving.  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  He does not have coverage for short-term disability - looking at applying for long term disability.  Denies new stroke/TIA symptoms   Compliant on aspirin  and Brilinta  without associated side effects Unable to tolerate pravastatin  due to lower extremity myalgias- not currently on any type of therapy  Blood pressure today 125/72 - does not routinely monitor at home   Loop recorder has not shown atrial fibrillation thus far    No further concerns at this time   Stroke admission 11/05/2020 [name] is a 63 y.o. male with history of diabetes, hypertension, hyperlipidemia, obesity, colon cancer, CAD stent MI status post stenting 10/2019 on aspirin  and Brilinta  who was admitted on 11/05/2020 for imbalance and falling.  Personally reviewed hospitalization pertinent progress notes, lab work and imaging summary provided.  Stroke work-up revealed left thalamic and posterior CR, bilateral occipital punctate, and right cerebellum small infarcts, embolic pattern secondary to unclear source s/p ILR.  On aspirin  and Brilinta  PTA and recommended continuation at discharge.  Uncontrolled DM with A1c 8.7.  HTN stable.  LDL 118 with history of statin intolerance and initiated pravastatin  20 mg daily. No prior stroke history.  Evaluated by therapies and recommended discharge to CIR for ongoing therapy needs with residual decreased functional mobility with imbalance and mild right-sided weakness.. Review of systems is positive for  numbness, weakness, vision difficulties only and all other systems negative.  PMH:  Past Medical History:  Diagnosis Date   Anemia 10/03/2009   Qualifier: Diagnosis of  By: [name]    Anxiety associated  with depression 10/11/2013   pt states he is no longer having this issue because he is happy with his job now   Colon cancer [facility])    right colon cancer- adenocarcinoma CEA level isnrmal at 1.8   Coronary artery disease    Diabetes mellitus    Diabetes mellitus type 2 in obese 03/20/2010   Qualifier: Diagnosis of  By: [name]     Erectile dysfunction 06/22/2016   Essential hypertension 06/05/2014   FATTY LIVER DISEASE 11/04/2009   Qualifier: Diagnosis of  By: Abran MD, Norleen SAILOR   Qualifier: Diagnosis of  By: Abran MD, Norleen SAILOR  Last Assessment & Plan:  conirmed by CT scan of abdomen in April 2016 encouraged to minimize simple carbs and fatty foods.   GERD 11/04/2009   Qualifier: Diagnosis of  By: Abran MD, Norleen SAILOR    Great toe pain, right 04/18/2017   Hepatic artery stenosis    History of colon cancer 01/02/2010   Qualifier: Diagnosis of  By: [name] Dr Abran  Last Assessment & Plan:  Follows closely with gastroenterology, Dr Abran   Hyperlipidemia    Hyperlipidemia, mixed 09/07/2010   Qualifier: Diagnosis of  By: [name]  Hypertension    Hypertriglyceridemia 12/14/2010   Hypotestosteronism 06/22/2016   INSOMNIA, CHRONIC 03/20/2010   Qualifier: Diagnosis of  By: [name] Ambien  10 mg daily does not keep asleep AdvilPM is over sedating and has trouble waking up    Iron  deficiency anemia 2011   Low testosterone  06/22/2016   Malignant neoplasm of colon (HCC) 01/02/2010   Qualifier: Diagnosis of  By: [name] Dr Abran    Muscle spasm of back 06/05/2014   Nipple pain 09/30/2016   Obesity    Palpitations 06/05/2014   Preventative health care 10/08/2012   Psoriasis    Psoriatic arthritis (HCC) 08/25/2017   Right knee pain 11/17/2011   Stroke [facility])     Social History:  Social History   Socioeconomic History   Marital status: Single    Spouse name: [name]   Number of children: 0   Years of education: Not on file   Highest education  level: Associate degree: occupational, scientist, product/process development, or vocational program  Occupational History   Occupation: Pensions Consultant    Comment: printers and fax  Tobacco Use   Smoking status: Former    Current packs/day: 0.00    Types: Cigarettes    Quit date: 07/16/2023    Years since quitting: 0.9   Smokeless tobacco: Never   Tobacco comments:    Smokes 1 Pack per week; started back 3 months ago  Vaping Use   Vaping status: Never Used  Substance and Sexual Activity   Alcohol use: No   Drug use: No   Sexual activity: Yes    Comment: lives with girlfriend, no dietary restrictions  Other Topics Concern   Not on file  Social History Narrative   Occupation: Pensions Consultant (conservator, museum/gallery)   Single  (lives with [place])   no children    former smoker   Illicit Drug Use - no    Social Drivers of Corporate Investment Banker Strain: Low Risk  (11/10/2023)   Overall Financial Resource Strain (CARDIA)    Difficulty of Paying Living Expenses: Not very hard  Food Insecurity: No Food Insecurity (06/21/2024)   Hunger Vital Sign    Worried About Running Out of Food in the Last Year: Never true    Ran Out of Food in the Last Year: Never true  Transportation Needs: Patient Declined (06/21/2024)   PRAPARE - Transportation    Lack of Transportation (Medical): Patient declined    Lack of Transportation (Non-Medical): Patient declined  Physical Activity: Inactive (11/10/2023)   Exercise Vital Sign    Days of Exercise per Week: 0 days    Minutes of Exercise per Session: 30 min  Stress: No Stress Concern Present (11/10/2023)   [employer] of Occupational Health - Occupational Stress Questionnaire    Feeling of Stress : Not at all  Social Connections: Patient Declined (06/21/2024)   Social Connection and Isolation Panel    Frequency of Communication with Friends and Family: Patient declined    Frequency of Social Gatherings with Friends and Family: Patient declined    Attends Religious Services: Patient  declined    Database Administrator or Organizations: Patient declined    Attends Banker Meetings: Patient declined    Marital Status: Patient declined  Intimate Partner Violence: Patient Declined (06/21/2024)   Humiliation, Afraid, Rape, and Kick questionnaire    Fear of Current or Ex-Partner: Patient declined    Emotionally Abused: Patient declined    Physically Abused: Patient declined  Sexually Abused: Patient declined    Medications:   Current Outpatient Medications on File Prior to Visit  Medication Sig Dispense Refill   aspirin  EC 81 MG tablet Take 1 tablet (81 mg total) by mouth daily. Swallow whole. 30 tablet 12   Blood Glucose Monitoring Suppl (CONTOUR NEXT EZ) w/Device KIT 1 each by Does not apply route as directed. 1 kit 1   Continuous Glucose Sensor (FREESTYLE LIBRE 3 PLUS SENSOR) MISC Change sensor every 15 days. 2 each 6   glucose blood (CONTOUR NEXT TEST) test strip 1 each by Other route 4 (four) times daily - after meals and at bedtime. Use as instructed 100 each 12   Insulin  Glargine (BASAGLAR  KWIKPEN) 100 UNIT/ML Inject 5 Units into the skin at bedtime. Titrate to Fasting Blood Sugar of 100- 150 mg/dL with morning blood sugar check. 15 mL 3   insulin  lispro (HUMALOG ) 100 UNIT/ML KwikPen Only take 3 units with meals if BG is greater than 250 up to three times daily 15 mL 5   metFORMIN  (GLUCOPHAGE -XR) 750 MG 24 hr tablet Take 1 tablet (750 mg total) by mouth 2 (two) times daily after a meal. (Patient taking differently: Take 750 mg by mouth as needed.) 180 tablet 3   QUEtiapine  (SEROQUEL ) 100 MG tablet Take 100 mg by mouth at bedtime.     ticagrelor  (BRILINTA ) 90 MG TABS tablet Take 1 tablet (90 mg total) by mouth 2 (two) times daily for 21 days. 42 tablet 0   Adalimumab-ryvk, 2 Pen, 40 MG/0.4ML AJKT Inject into the skin. (Patient not taking: Reported on 06/21/2024)     rosuvastatin  (CRESTOR ) 20 MG tablet Take 1 tablet (20 mg total) by mouth every evening.  (Patient not taking: Reported on 06/25/2024) 90 tablet 3   No current facility-administered medications on file prior to visit.    Allergies:   Allergies  Allergen Reactions   Clopidogrel  Anaphylaxis    Weird sensation in legs     Physical Exam General: well developed, well nourished, seated, in no evident distress Head: head normocephalic and atraumatic.   Neck: supple with no carotid or supraclavicular bruits Cardiovascular: regular rate and rhythm, no murmurs Musculoskeletal: no deformity Skin:  no rash/petichiae Vascular:  Normal pulses all extremities  Neurologic Exam Mental Status: Awake and fully alert. Oriented to place and time. Recent and remote memory intact. Attention span, concentration and fund of knowledge appropriate. Mood and affect appropriate.  Cranial Nerves: Fundoscopic exam reveals sharp disc margins. Pupils equal, briskly reactive to light. Extraocular movements full without nystagmus. Visual fields show partial left homonymous hemianopsia to confrontation. Hearing intact. Facial sensation intact. Face, tongue, palate moves normally and symmetrically.  Motor: Normal bulk and tone. Normal strength in all tested extremity muscles.  Diminished fine finger movements on the left.  Positive right over left upper extremity.  Slight increase in tone in the left leg.   Sensory.:  Diminished left hemibody sensation to touch , pinprick , position and vibratory sensation.  Coordination: Rapid alternating movements normal in all extremities. Finger-to-nose and heel-to-shin performed accurately bilaterally. Gait and Station: Arises from chair without difficulty. Stance is normal.  Uses a wheeled walker.  Gait demonstrates slight stiffness and dragging of the left leg.  Not able to heel, toe and tandem walk without difficulty.  Reflexes: 2+ and asymmetric and brisker on the left. Toes downgoing.   NIHSS  3 Modified Rankin  3   ASSESSMENT: 63 year old Caucasian male with  embolic right MCA branch infarct in  December 2025 of cryptogenic etiology.  Remote history of left thalamic, posterior corona radiata and bilateral occipital right cerebellar embolic infarcts in April 2022 Vascular risk factors of intracranial stenosis, diabetes, hyperlipidemia, coronary artery disease and prior strokes    PLAN:I had a long d/w patient and his  lady friend about his recent stroke, risk for recurrent stroke/TIAs, personally independently reviewed imaging studies and stroke evaluation results and answered questions.Continue aspirin  81 mg daily and Brilinta  (ticagrelor ) 90 mg bid  for secondary stroke prevention and maintain strict control of hypertension with blood pressure goal below 130/90, diabetes with hemoglobin A1c goal below 6.5% and lipids with LDL cholesterol goal below 70 mg/dL. I also advised the patient to eat a healthy diet with plenty of whole grains, cereals, fruits and vegetables, exercise regularly and maintain ideal body weight.  Recommend outpatient physical and Occupational Therapy.  He was advised to walker at all times for safety and balance.  Followup in the future with nurse practitioner or call earlier if necessary.   I personally spent a total of 50 minutes in the care of the patient today including getting/reviewing separately obtained history, performing a medically appropriate exam/evaluation, counseling and educating, placing orders, referring and communicating with other health care professionals, documenting clinical information in the EHR, independently interpreting results, and coordinating care.        Eather Popp, MD Note: This document was prepared with digital dictation and possible smart phrase technology. Any transcriptional errors that result from this process are unintentional.

## 2024-06-25 NOTE — Transitions of Care (Post Inpatient/ED Visit) (Signed)
 06/25/2024  Name: [name] MRN: [id number] DOB: July 01, 1961  Today's TOC FU Call Status: Today's TOC FU Call Status:: Successful TOC FU Call Completed TOC FU Call Complete Date: 06/25/24  Patient's Name and Date of Birth confirmed. Name, DOB  Transition Care Management Follow-up Telephone Call Date of Discharge: 06/22/24 Discharge Facility: [facility]) Type of Discharge: Inpatient Admission Primary Inpatient Discharge Diagnosis:: cerebral infarction How have you been since you were released from the hospital?: Better Any questions or concerns?: No  Items Reviewed: Did you receive and understand the discharge instructions provided?: Yes Medications obtained,verified, and reconciled?: Yes (Medications Reviewed) Any new allergies since your discharge?: No Dietary orders reviewed?: Yes Do you have support at home?: Yes People in Home [RPT]: friend(s)  Medications Reviewed Today: Medications Reviewed Today     Reviewed by Emmitt Pan, LPN (Licensed Practical Nurse) on 06/25/24 at 1432  Med List Status: <None>   Medication Order Taking? Sig Documenting Provider Last Dose Status Informant  Adalimumab-ryvk, 2 Pen, 40 MG/0.4ML AJKT [id number]  Inject into the skin.  Patient not taking: Reported on 06/25/2024   [provider]  Active Spouse/Significant Other, Self  aspirin  EC 81 MG tablet [id number] Yes Take 1 tablet (81 mg total) by mouth daily. Swallow whole. Tobie Calix, MD  Active   Blood Glucose Monitoring Suppl (CONTOUR NEXT EZ) w/Device KIT [id number] Yes 1 each by Does not apply route as directed. Simmons-Robinson, Rockie, MD  Active Spouse/Significant Other, Self  Continuous Glucose Sensor (FREESTYLE LIBRE 3 PLUS SENSOR) MISC [id number] Yes Change sensor every 15 days. Aletha Bene, MD  Active Spouse/Significant Other, Self  glucose blood (CONTOUR NEXT TEST) test strip [id number] Yes 1 each by Other route 4 (four) times daily -  after meals and at bedtime. Use as instructed Emilio Kelly DASEN, FNP  Active Self, Spouse/Significant Other  Insulin  Glargine (BASAGLAR  KWIKPEN) 100 UNIT/ML [id number] Yes Inject 5 Units into the skin at bedtime. Titrate to Fasting Blood Sugar of 100- 150 mg/dL with morning blood sugar check. Simmons-Robinson, Rockie, MD  Active Spouse/Significant Other, Self  insulin  lispro (HUMALOG ) 100 UNIT/ML KwikPen [id number] Yes Only take 3 units with meals if BG is greater than 250 up to three times daily Simmons-Robinson, Makiera, MD  Active Spouse/Significant Other, Self  metFORMIN  (GLUCOPHAGE -XR) 750 MG 24 hr tablet [id number] Yes Take 1 tablet (750 mg total) by mouth 2 (two) times daily after a meal.  Patient taking differently: Take 750 mg by mouth as needed.   Emilio Kelly DASEN, FNP  Active Self, Spouse/Significant Other           Med Note JOSUE, COLLEEN S   Wed Mar 21, 2024 10:16 AM) Pt is not consistent with taking this. Last dose was on Monday.   QUEtiapine  (SEROQUEL ) 100 MG tablet [id number] Yes Take 100 mg by mouth at bedtime. [provider]  Active Spouse/Significant Other, Self  rosuvastatin  (CRESTOR ) 20 MG tablet [id number]  Take 1 tablet (20 mg total) by mouth every evening.  Patient not taking: Reported on 06/25/2024   Patel, Sona, MD  Active   ticagrelor  (BRILINTA ) 90 MG TABS tablet [id number] Yes Take 1 tablet (90 mg total) by mouth 2 (two) times daily for 21 days. Tobie Calix, MD  Active             Home Care and Equipment/Supplies: Were Home Health Services Ordered?: NA Any new equipment or medical supplies ordered?: NA  Functional Questionnaire: Do you need assistance with bathing/showering or dressing?:  No Do you need assistance with meal preparation?: No Do you need assistance with eating?: No Do you have difficulty maintaining continence: No Do you need assistance with getting out of bed/getting out of a chair/moving?: No Do you have difficulty managing or taking your  medications?: No  Follow up appointments reviewed: PCP Follow-up appointment confirmed?: Yes Date of PCP follow-up appointment?: 06/26/24 Follow-up Provider: [facility] Follow-up appointment confirmed?: Yes Date of Specialist follow-up appointment?: 06/28/24 Do you need transportation to your follow-up appointment?: No Do you understand care options if your condition(s) worsen?: Yes-patient verbalized understanding    SIGNATURE Julian Lemmings, LPN [facility] Nurse Health Advisor Direct Dial  [phone]

## 2024-06-25 NOTE — Patient Instructions (Signed)
 I had a long d/w patient and his  lady friend about his recent stroke, risk for recurrent stroke/TIAs, personally independently reviewed imaging studies and stroke evaluation results and answered questions.Continue aspirin  81 mg daily and Brilinta  (ticagrelor ) 90 mg bid  for secondary stroke prevention and maintain strict control of hypertension with blood pressure goal below 130/90, diabetes with hemoglobin A1c goal below 6.5% and lipids with LDL cholesterol goal below 70 mg/dL. I also advised the patient to eat a healthy diet with plenty of whole grains, cereals, fruits and vegetables, exercise regularly and maintain ideal body weight.  Recommend outpatient physical and Occupational Therapy.  He was advised to walker at all times for safety and balance.  Followup in the future with nurse practitioner or call earlier if necessary.  Stroke Prevention Some medical conditions and behaviors can lead to a higher chance of having a stroke. You can help prevent a stroke by eating healthy, exercising, not smoking, and managing any medical conditions you have. Stroke is a leading cause of functional impairment. Primary prevention is particularly important because a majority of strokes are first-time events. Stroke changes the lives of not only those who experience a stroke but also their family and other caregivers. How can this condition affect me? A stroke is a medical emergency and should be treated right away. A stroke can lead to brain damage and can sometimes be life-threatening. If a person gets medical treatment right away, there is a better chance of surviving and recovering from a stroke. What can increase my risk? The following medical conditions may increase your risk of a stroke: Cardiovascular disease. High blood pressure (hypertension). Diabetes. High cholesterol. Sickle cell disease. Blood clotting disorders (hypercoagulable state). Obesity. Sleep disorders (obstructive sleep apnea). Other  risk factors include: Being older than age 57. Having a history of blood clots, stroke, or mini-stroke (transient ischemic attack, TIA). Genetic factors, such as race, ethnicity, or a family history of stroke. Smoking cigarettes or using other tobacco products. Taking birth control pills, especially if you also use tobacco. Heavy use of alcohol or drugs, especially cocaine and methamphetamine. Physical inactivity. What actions can I take to prevent this? Manage your health conditions High cholesterol levels. Eating a healthy diet is important for preventing high cholesterol. If cholesterol cannot be managed through diet alone, you may need to take medicines. Take any prescribed medicines to control your cholesterol as told by your health care provider. Hypertension. To reduce your risk of stroke, try to keep your blood pressure below 130/80. Eating a healthy diet and exercising regularly are important for controlling blood pressure. If these steps are not enough to manage your blood pressure, you may need to take medicines. Take any prescribed medicines to control hypertension as told by your health care provider. Ask your health care provider if you should monitor your blood pressure at home. Have your blood pressure checked every year, even if your blood pressure is normal. Blood pressure increases with age and some medical conditions. Diabetes. Eating a healthy diet and exercising regularly are important parts of managing your blood sugar (glucose). If your blood sugar cannot be managed through diet and exercise, you may need to take medicines. Take any prescribed medicines to control your diabetes as told by your health care provider. Get evaluated for obstructive sleep apnea. Talk to your health care provider about getting a sleep evaluation if you snore a lot or have excessive sleepiness. Make sure that any other medical conditions you have, such as atrial  fibrillation or  atherosclerosis, are managed. Nutrition Follow instructions from your health care provider about what to eat or drink to help manage your health condition. These instructions may include: Reducing your daily calorie intake. Limiting how much salt (sodium) you use to 1,500 milligrams (mg) each day. Using only healthy fats for cooking, such as olive oil, canola oil, or sunflower oil. Eating healthy foods. You can do this by: Choosing foods that are high in fiber, such as whole grains, and fresh fruits and vegetables. Eating at least 5 servings of fruits and vegetables a day. Try to fill one-half of your plate with fruits and vegetables at each meal. Choosing lean protein foods, such as lean cuts of meat, poultry without skin, fish, tofu, beans, and nuts. Eating low-fat dairy products. Avoiding foods that are high in sodium. This can help lower blood pressure. Avoiding foods that have saturated fat, trans fat, and cholesterol. This can help prevent high cholesterol. Avoiding processed and prepared foods. Counting your daily carbohydrate intake.  Lifestyle If you drink alcohol: Limit how much you have to: 0-1 drink a day for women who are not pregnant. 0-2 drinks a day for men. Know how much alcohol is in your drink. In the [place] one drink equals one 12 oz bottle of beer ( ), one 5 oz glass of wine ( ), or one 1 oz glass of hard liquor (44mL). Do not use any products that contain nicotine or tobacco. These products include cigarettes, chewing tobacco, and vaping devices, such as e-cigarettes. If you need help quitting, ask your health care provider. Avoid secondhand smoke. Do not use drugs. Activity  Try to stay at a healthy weight. Get at least 30 minutes of exercise on most days, such as: Fast walking. Biking. Swimming. Medicines Take over-the-counter and prescription medicines only as told by your health care provider. Aspirin  or blood thinners (antiplatelets or  anticoagulants) may be recommended to reduce your risk of forming blood clots that can lead to stroke. Avoid taking birth control pills. Talk to your health care provider about the risks of taking birth control pills if: You are over 28 years old. You smoke. You get very bad headaches. You have had a blood clot. Where to find more information American Stroke Association: www.strokeassociation.org Get help right away if: You or a loved one has any symptoms of a stroke. BE FAST is an easy way to remember the main warning signs of a stroke: B - Balance. Signs are dizziness, sudden trouble walking, or loss of balance. E - Eyes. Signs are trouble seeing or a sudden change in vision. F - Face. Signs are sudden weakness or numbness of the face, or the face or eyelid drooping on one side. A - Arms. Signs are weakness or numbness in an arm. This happens suddenly and usually on one side of the body. S - Speech. Signs are sudden trouble speaking, slurred speech, or trouble understanding what people say. T - Time. Time to call emergency services. Write down what time symptoms started. You or a loved one has other signs of a stroke, such as: A sudden, severe headache with no known cause. Nausea or vomiting. Seizure. These symptoms may represent a serious problem that is an emergency. Do not wait to see if the symptoms will go away. Get medical help right away. Call your local emergency services (911 in the [place] Do not drive yourself to the hospital. Summary You can help to prevent a stroke by eating healthy, exercising, not smoking,  limiting alcohol intake, and managing any medical conditions you may have. Do not use any products that contain nicotine or tobacco. These include cigarettes, chewing tobacco, and vaping devices, such as e-cigarettes. If you need help quitting, ask your health care provider. Remember BE FAST for warning signs of a stroke. Get help right away if you or a loved one has any  of these signs. This information is not intended to replace advice given to you by your health care provider. Make sure you discuss any questions you have with your health care provider. Document Revised: 06/07/2022 Document Reviewed: 06/07/2022 Elsevier Patient Education  2024 [employer].

## 2024-06-26 ENCOUNTER — Other Ambulatory Visit: Payer: Self-pay | Admitting: Family Medicine

## 2024-06-26 ENCOUNTER — Ambulatory Visit (INDEPENDENT_AMBULATORY_CARE_PROVIDER_SITE_OTHER): Admitting: Family Medicine

## 2024-06-26 ENCOUNTER — Encounter: Payer: Self-pay | Admitting: Family Medicine

## 2024-06-26 VITALS — BP 130/70 | HR 91 | Temp 98.3°F | Ht 68.0 in | Wt 180.0 lb

## 2024-06-26 DIAGNOSIS — Z79899 Other long term (current) drug therapy: Secondary | ICD-10-CM | POA: Diagnosis not present

## 2024-06-26 DIAGNOSIS — E1165 Type 2 diabetes mellitus with hyperglycemia: Secondary | ICD-10-CM | POA: Diagnosis not present

## 2024-06-26 DIAGNOSIS — I25118 Atherosclerotic heart disease of native coronary artery with other forms of angina pectoris: Secondary | ICD-10-CM | POA: Diagnosis not present

## 2024-06-26 DIAGNOSIS — E785 Hyperlipidemia, unspecified: Secondary | ICD-10-CM | POA: Diagnosis not present

## 2024-06-26 DIAGNOSIS — F419 Anxiety disorder, unspecified: Secondary | ICD-10-CM | POA: Diagnosis not present

## 2024-06-26 DIAGNOSIS — Z794 Long term (current) use of insulin: Secondary | ICD-10-CM

## 2024-06-26 DIAGNOSIS — I639 Cerebral infarction, unspecified: Secondary | ICD-10-CM

## 2024-06-26 DIAGNOSIS — L4050 Arthropathic psoriasis, unspecified: Secondary | ICD-10-CM | POA: Diagnosis not present

## 2024-06-26 DIAGNOSIS — I6930 Unspecified sequelae of cerebral infarction: Secondary | ICD-10-CM

## 2024-06-26 MED ORDER — MIRTAZAPINE 15 MG PO TABS: 15.0000 mg | ORAL_TABLET | Freq: Every day | ORAL | 1 refills | Status: AC

## 2024-06-26 MED ORDER — BUSPIRONE HCL 7.5 MG PO TABS: 7.5000 mg | ORAL_TABLET | Freq: Two times a day (BID) | ORAL | 0 refills | Status: AC

## 2024-06-26 NOTE — Progress Notes (Signed)
 Patient Office Visit  Assessment & Plan:  Cerebrovascular accident (CVA), unspecified mechanism (HCC)  Anxiety -     busPIRone  HCl; Take 1 tablet (7.5 mg total) by mouth 2 (two) times daily.  Dispense: 60 tablet; Refill: 0 -     Mirtazapine ; Take 1 tablet (15 mg total) by mouth at bedtime. For depression  Dispense: 90 tablet; Refill: 1  Hyperlipidemia, unspecified hyperlipidemia type  Coronary artery disease involving native coronary artery of native heart with other form of angina pectoris  Type 2 diabetes mellitus with hyperglycemia, with long-term current use of insulin  (HCC)  Psoriatic arthritis (HCC)  Taking a statin medication   Assessment and Plan    Cerebrovascular accident with left hemiparesis and sensory loss Recent right-sided stroke causing left hemiparesis and sensory loss. No sensory improvement. Peripheral vision loss affects driving. Neurologist advised against driving. - Continue Brilinta  and aspirin  for 21 days, then aspirin  alone. - Attend physical and occupational therapy evaluation on Thursday. - Arrange medical transport for non-driving situations.  Anxiety and depression after stroke Increased anxiety and depression post-stroke. Lexapro  discontinued due to pro-clotting risk. Seroquel  out of prescription. Buspar  and Remeron  considered for management. Discussed risks of SSRIs and benzodiazepines. - Prescribed Buspar  for anxiety management. - Prescribed Remeron  for depression management. - Sent MyChart message for Seroquel  refill. - Follow up in 6 weeks to assess response to medications.  Type 2 diabetes mellitus Diabetes well-controlled with A1c improved from 6.6 to 6.1. - Continue current diabetes management plan.  Hyperlipidemia LDL cholesterol not at target. Crestor  not taken consistently. Emphasized importance for stroke prevention. - Continue Crestor  (rosuvastatin ) 20 mg daily. - Recheck lipid panel in 6 weeks.      Patient will take BuSpar   7.5 mg twice a day for anxiety.  May need to increase the dosage.  Remeron  15 mg at night to help with depression.  Patient will be taking the Seroquel  at nighttime but is aware that the 2 medications may be too sedating.  He may not need the Seroquel  at night once he starts the Remeron .  Patient declines psychotherapy. Pt will not take insulin  for now, continue Metformin  for now Recheck labs at next office visit. Patient will be starting PT/OT this week Return in about 6 weeks (around 08/07/2024), or if symptoms worsen or fail to improve.   Subjective:    Patient ID: [name], male    DOB: 1960/08/08  Age: 62 y.o. MRN: [id number]  No chief complaint on file.   HPI Discussed the use of AI scribe software for clinical note transcription with the patient, who gave verbal consent to proceed.      Discharge Diagnoses: Principal Problem:   CVA (cerebral vascular accident) (HCC) Active Problems:   Hypertension associated with diabetes (HCC)   Hyperlipidemia associated with type 2 diabetes mellitus (HCC)   Diabetes mellitus (HCC)   Coronary artery disease   S/P CABG x 2   [name] is a 63 y.o. year old male with medical history of hypertension, hyperlipidemia, type 2 diabetes, CAD status post CABG presenting to the ED with left-sided weakness that started yesterday.     Pt states he developed left-sided weakness and numbness yesterday.  When I inquired about the timing of this he states he is not sure but gets agitated if I mention 7 PM stating he is not sure what that number came up.  He states after the left-sided weakness he has had a few falls secondary to that.  Patient lives  alone and he was having difficulty ambulating he called his friend who recommended he come to get evaluated in the ED.   CT head without any acute findings and CTA shows no large vessel occlusion or acute ischemia but showed mild progressive intracranial atherosclerosis.  Patient presented as a code stroke  and was evaluated by Dr. Matthews and she recommended admission for further workup.    Acute Right Parietal,Temporal,occipital CVA, ischemic -- Pt with symptoms of weakness that is unilateral likely having a stroke. -- MRI Brain   Multiple small acute ischemic infarcts within the right hemisphere affecting parts of the right parietal cortex, right temporal cortex, and the right occipital lobe, with punctate foci of acute ischemia in the right frontal white matter. 2. Multifocal hyperintense T2-weighted signal within the cerebral white matter, most commonly due to chronic small vessel disease. --Pt outside the window of tPA. Neurology consulted, appreciate their recommendations.  Patient does endorse nonadherence to his chronic medications - Allow for permissive HTN in the setting of acute stroke  - ASA 81 mg daily and Brillinta bid for 21 days and then ASA 81 mg every day per Dr Matthews  - Echocardiogram --EF 45%. Bubble study neg -resumed crestor  - PT/OT--recommends HH--pt refused per TOC -- patient is very irritable and is insisting on discharge.   Hypertension: Permissive hypertension in setting of strokes --bp stable. Pt not on meds at home   Hyperlipidemia: Continue high intensity statin  Type II Diabetes: On insulin  and metformin  --defer to PCP for out pt management Psoriatic arthritis: on injectable once a month. Continue outpatient.   History of Present Illness [name] is a 63 year old male with a history of stroke who presents for follow-up after a recent stroke affecting his left side/Upper extremities/lower extremities  He experienced his second stroke, which affected his right side, leading to left-sided weakness and loss of sensation. As he is left-hand dominant, this has significantly impacted his daily activities, as he cannot feel or use his left hand effectively. He has no feeling in his left face, arm, and leg, although he can lift his arm and leg.  He has  significant agitation, anxiety, and depression following the stroke. He was previously on Lexapro  (escitalopram ) 10 mg, which was stopped by another doctor. He feels his current regimen is not effective and expresses a need for stronger medication. He has a history of taking Seroquel  for sleep, which he finds helpful, but he is almost out of his prescription. He will send a message via mychart for refill  He has peripheral vision issues on the left side, affecting his ability to drive. He has difficulty in perceiving the number of fingers held up during a test.  He is currently taking Brilinta , ASA and Crestor . He has not been consistent with Crestor  due to conflicting advice from different doctors. He also takes metformin  for diabetes, which is well-controlled with an A1c of 6.1.  He expresses feelings of hopelessness and a desire to 'leave this world' following personal losses, including the death of his mother and pets. He has a gun at home but will not use it due to having a cat  Physical Exam NEUROLOGICAL: Strong hand grip strength. Able to lift arm and leg.  Results LABS A1c: 6.1 LDL: 96 Total Cholesterol: 162  RADIOLOGY CT Head: Performed immediately upon arrival at the hospital. CT Angiography: Performed immediately upon arrival at the hospital. MRI: Performed at 10-11 PM. (06/25/2024)  Assessment and Plan Cerebrovascular accident  with left hemiparesis and sensory loss Recent right-sided stroke causing left hemiparesis and sensory loss. No sensory improvement. Peripheral vision loss affects driving. Neurologist advised against driving. - Continue Brilinta  and aspirin  for 21 days, then aspirin  alone. - Attend physical and occupational therapy evaluation on Thursday. - Arrange medical transport for non-driving situations.  Anxiety and depression after stroke Increased anxiety and depression post-stroke. Lexapro  discontinued due to pro-clotting risk. Seroquel  out of prescription.  Buspar  and Remeron  considered for management. Discussed risks of SSRIs and benzodiazepines. - Prescribed Buspar  for anxiety management. - Prescribed Remeron  for depression management. - Sent MyChart message for Seroquel  refill. - Follow up in 6 weeks to assess response to medications.  Type 2 diabetes mellitus well controlled Diabetes well-controlled with A1c improved from 6.6 to 6.1. - Continue current diabetes management plan but stop the insulin  for now  Hyperlipidemia LDL cholesterol not at target. Crestor  not taken consistently. Emphasized importance for stroke prevention. - Continue Crestor  (rosuvastatin ) 20 mg daily. - Recheck lipid panel in 6 weeks.   The ASCVD Risk score (Arnett DK, et al., 2019) failed to calculate for the following reasons:   Risk score cannot be calculated because patient has a medical history suggesting prior/existing ASCVD  Past Medical History:  Diagnosis Date   Anemia 10/03/2009   Qualifier: Diagnosis of  By: Georgian ROSALEA CHARM Lamar    Anxiety associated with depression 10/11/2013   pt states he is no longer having this issue because he is happy with his job now   Colon cancer [facility])    right colon cancer- adenocarcinoma CEA level isnrmal at 1.8   Coronary artery disease    Diabetes mellitus    Diabetes mellitus type 2 in obese 03/20/2010   Qualifier: Diagnosis of  By: Georgian ROSALEA CHARM Lamar     Erectile dysfunction 06/22/2016   Essential hypertension 06/05/2014   FATTY LIVER DISEASE 11/04/2009   Qualifier: Diagnosis of  By: Abran MD, Norleen SAILOR   Qualifier: Diagnosis of  By: Abran MD, Norleen SAILOR  Last Assessment & Plan:  conirmed by CT scan of abdomen in April 2016 encouraged to minimize simple carbs and fatty foods.   GERD 11/04/2009   Qualifier: Diagnosis of  By: Abran MD, Norleen SAILOR    Great toe pain, right 04/18/2017   Hepatic artery stenosis    History of colon cancer 01/02/2010   Qualifier: Diagnosis of  By: Georgian ROSALEA CHARM Lamar Dr Abran  Last Assessment & Plan:   Follows closely with gastroenterology, Dr Abran   Hyperlipidemia    Hyperlipidemia, mixed 09/07/2010   Qualifier: Diagnosis of  By: Georgian ROSALEA CHARM Lamar    Hypertension    Hypertriglyceridemia 12/14/2010   Hypotestosteronism 06/22/2016   INSOMNIA, CHRONIC 03/20/2010   Qualifier: Diagnosis of  By: Georgian ROSALEA CHARM Lamar Ambien  10 mg daily does not keep asleep AdvilPM is over sedating and has trouble waking up    Iron  deficiency anemia 2011   Low testosterone  06/22/2016   Malignant neoplasm of colon (HCC) 01/02/2010   Qualifier: Diagnosis of  By: Georgian ROSALEA CHARM Lamar Dr Abran    Muscle spasm of back 06/05/2014   Nipple pain 09/30/2016   Obesity    Palpitations 06/05/2014   Preventative health care 10/08/2012   Psoriasis    Psoriatic arthritis (HCC) 08/25/2017   Right knee pain 11/17/2011   Stroke [facility])    Past Surgical History:  Procedure Laterality Date   BUBBLE STUDY  10/21/2020   Procedure: BUBBLE STUDY;  Surgeon: Mona Vinie BROCKS, MD;  Location: [facility] ENDOSCOPY;  Service: Cardiovascular;;   CARPAL TUNNEL RELEASE     RIGHT   CHOLECYSTECTOMY  1994   COLONOSCOPY     CORONARY ARTERY BYPASS GRAFT N/A 09/19/2023   Procedure: CORONARY ARTERY BYPASS GRAFTING (CABG) TIMES TWO USING LEFT INTERNAL MAMMARY ARTERY AND ENDOSCOPICALLY HARVESTED RIGHT GREATER SAPHENOUS VEIN;  Surgeon: Lucas Dorise POUR, MD;  Location: [facility] OR;  Service: Open Heart Surgery;  Laterality: N/A;   CORONARY/GRAFT ACUTE MI REVASCULARIZATION N/A 10/27/2019   Procedure: Coronary/Graft Acute MI Revascularization;  Surgeon: Darron Deatrice LABOR, MD;  Location: [facility] INVASIVE CV LAB;  Service: Cardiovascular;  Laterality: N/A;   HEMICOLECTOMY     12/17/2009 right   LEFT HEART CATH AND CORONARY ANGIOGRAPHY N/A 10/27/2019   Procedure: LEFT HEART CATH AND CORONARY ANGIOGRAPHY;  Surgeon: Darron Deatrice LABOR, MD;  Location: [facility] INVASIVE CV LAB;  Service: Cardiovascular;  Laterality: N/A;   LOOP RECORDER INSERTION N/A 10/21/2020   Procedure: LOOP RECORDER  INSERTION;  Surgeon: Kelsie Agent, MD;  Location: [facility] INVASIVE CV LAB;  Service: Cardiovascular;  Laterality: N/A;   POLYPECTOMY     RIGHT/LEFT HEART CATH AND CORONARY ANGIOGRAPHY Bilateral 08/12/2023   Procedure: RIGHT/LEFT HEART CATH AND CORONARY ANGIOGRAPHY;  Surgeon: Darron Deatrice LABOR, MD;  Location: [facility] INVASIVE CV LAB;  Service: Cardiovascular;  Laterality: Bilateral;   TEE WITHOUT CARDIOVERSION N/A 10/21/2020   Procedure: TRANSESOPHAGEAL ECHOCARDIOGRAM (TEE);  Surgeon: Mona Vinie BROCKS, MD;  Location: [facility] ENDOSCOPY;  Service: Cardiovascular;  Laterality: N/A;   TEE WITHOUT CARDIOVERSION N/A 09/19/2023   Procedure: TRANSESOPHAGEAL ECHOCARDIOGRAM (TEE);  Surgeon: Lucas Dorise POUR, MD;  Location: [facility] OR;  Service: Open Heart Surgery;  Laterality: N/A;   Social History   Tobacco Use   Smoking status: Former    Current packs/day: 0.00    Types: Cigarettes    Quit date: 07/16/2023    Years since quitting: 0.9   Smokeless tobacco: Never   Tobacco comments:    Smokes 1 Pack per week; started back 3 months ago  Vaping Use   Vaping status: Never Used  Substance Use Topics   Alcohol use: No   Drug use: No   Family History  Problem Relation Age of Onset   Stomach cancer Mother        diedinher 97's   Diabetes type II Brother        boderline   Diabetes Brother        type II   Alcohol abuse Brother    Other Neg Hx        cad,prostate ca, colon ca   Coronary artery disease Neg Hx    Cancer Neg Hx        colon, prostate   Colon cancer Neg Hx    Esophageal cancer Neg Hx    Rectal cancer Neg Hx    Allergies  Allergen Reactions   Clopidogrel  Anaphylaxis    Weird sensation in legs     ROS    Objective:    BP 130/70   Pulse 91   Temp 98.3 F (36.8 C)   Ht 5' 8 (1.727 m)   Wt 180 lb (81.6 kg)   SpO2 98%   BMI 27.37 kg/m  BP Readings from Last 3 Encounters:  06/26/24 130/70  06/25/24 122/73  06/22/24 112/70   Wt Readings from Last 3 Encounters:  06/26/24 180 lb  (81.6 kg)  06/25/24 179 lb 6.4 oz (81.4 kg)  06/21/24 176 lb 2.4 oz (  79.9 kg)    Physical Exam Vitals and nursing note reviewed.  Constitutional:      General: He is not in acute distress.    Appearance: Normal appearance.     Comments: Comes in with his friend. Using a walker  HENT:     Head: Normocephalic.     Right Ear: Tympanic membrane, ear canal and external ear normal.     Left Ear: Tympanic membrane, ear canal and external ear normal.  Eyes:     Extraocular Movements: Extraocular movements intact.     Conjunctiva/sclera: Conjunctivae normal.     Pupils: Pupils are equal, round, and reactive to light.  Cardiovascular:     Rate and Rhythm: Normal rate and regular rhythm.     Heart sounds: Normal heart sounds.  Pulmonary:     Effort: Pulmonary effort is normal.     Breath sounds: Normal breath sounds.  Musculoskeletal:     Right lower leg: No edema.     Left lower leg: No edema.  Neurological:     General: No focal deficit present.     Mental Status: He is alert and oriented to person, place, and time.     Comments: Good grip strength left upper arm. Patient can lift left arm, no sensation  Psychiatric:        Attention and Perception: Attention normal.        Mood and Affect: Mood is anxious.        Speech: Speech normal.        Behavior: Behavior is agitated.        Cognition and Memory: Cognition normal.   s   No results found for any visits on 06/26/24.

## 2024-06-27 ENCOUNTER — Other Ambulatory Visit: Payer: Self-pay

## 2024-06-28 ENCOUNTER — Ambulatory Visit: Admitting: Occupational Therapy

## 2024-06-28 ENCOUNTER — Ambulatory Visit: Attending: Neurology

## 2024-06-28 DIAGNOSIS — I639 Cerebral infarction, unspecified: Secondary | ICD-10-CM | POA: Diagnosis present

## 2024-06-28 DIAGNOSIS — R2689 Other abnormalities of gait and mobility: Secondary | ICD-10-CM | POA: Diagnosis present

## 2024-06-28 DIAGNOSIS — R278 Other lack of coordination: Secondary | ICD-10-CM | POA: Insufficient documentation

## 2024-06-28 DIAGNOSIS — M6281 Muscle weakness (generalized): Secondary | ICD-10-CM

## 2024-06-28 DIAGNOSIS — R29818 Other symptoms and signs involving the nervous system: Secondary | ICD-10-CM | POA: Diagnosis not present

## 2024-06-28 DIAGNOSIS — R2681 Unsteadiness on feet: Secondary | ICD-10-CM

## 2024-06-28 NOTE — Therapy (Incomplete)
 OUTPATIENT OCCUPATIONAL THERAPY NEURO EVALUATION  Patient Name: [name] MRN: [id number] DOB:04/15/1961, 63 y.o., male Today's Date: 06/28/2024  PCP: Aletha Bene, MD REFERRING PROVIDER: Rosemarie Eather RAMAN, MD  END OF SESSION:  OT End of Session - 06/28/24 2125     Visit Number 1    Number of Visits 12    Date for Recertification  09/20/24    OT Start Time 1615    OT Stop Time 1710    OT Time Calculation (min) 55 min    Activity Tolerance Patient tolerated treatment well    Behavior During Therapy Agitated;WFL for tasks assessed/performed          Past Medical History:  Diagnosis Date   Anemia 10/03/2009   Qualifier: Diagnosis of  By: Georgian ROSALEA CHARM Lamar    Anxiety associated with depression 10/11/2013   pt states he is no longer having this issue because he is happy with his job now   Colon cancer [facility])    right colon cancer- adenocarcinoma CEA level isnrmal at 1.8   Coronary artery disease    Diabetes mellitus    Diabetes mellitus type 2 in obese 03/20/2010   Qualifier: Diagnosis of  By: Georgian ROSALEA CHARM Lamar     Erectile dysfunction 06/22/2016   Essential hypertension 06/05/2014   FATTY LIVER DISEASE 11/04/2009   Qualifier: Diagnosis of  By: Abran MD, Norleen SAILOR   Qualifier: Diagnosis of  By: Abran MD, Norleen SAILOR  Last Assessment & Plan:  conirmed by CT scan of abdomen in April 2016 encouraged to minimize simple carbs and fatty foods.   GERD 11/04/2009   Qualifier: Diagnosis of  By: Abran MD, Norleen SAILOR    Great toe pain, right 04/18/2017   Hepatic artery stenosis    History of colon cancer 01/02/2010   Qualifier: Diagnosis of  By: Georgian ROSALEA CHARM Lamar Dr Abran  Last Assessment & Plan:  Follows closely with gastroenterology, Dr Abran   Hyperlipidemia    Hyperlipidemia, mixed 09/07/2010   Qualifier: Diagnosis of  By: Georgian ROSALEA CHARM Lamar    Hypertension    Hypertriglyceridemia 12/14/2010   Hypotestosteronism 06/22/2016   INSOMNIA, CHRONIC 03/20/2010    Qualifier: Diagnosis of  By: Georgian ROSALEA CHARM Lamar Ambien  10 mg daily does not keep asleep AdvilPM is over sedating and has trouble waking up    Iron  deficiency anemia 2011   Low testosterone  06/22/2016   Malignant neoplasm of colon (HCC) 01/02/2010   Qualifier: Diagnosis of  By: Georgian ROSALEA CHARM Lamar Dr Abran    Muscle spasm of back 06/05/2014   Nipple pain 09/30/2016   Obesity    Palpitations 06/05/2014   Preventative health care 10/08/2012   Psoriasis    Psoriatic arthritis (HCC) 08/25/2017   Right knee pain 11/17/2011   Stroke [facility])    Past Surgical History:  Procedure Laterality Date   BUBBLE STUDY  10/21/2020   Procedure: BUBBLE STUDY;  Surgeon: Mona Vinie BROCKS, MD;  Location: [facility] ENDOSCOPY;  Service: Cardiovascular;;   CARPAL TUNNEL RELEASE     RIGHT   CHOLECYSTECTOMY  1994   COLONOSCOPY     CORONARY ARTERY BYPASS GRAFT N/A 09/19/2023   Procedure: CORONARY ARTERY BYPASS GRAFTING (CABG) TIMES TWO USING LEFT INTERNAL MAMMARY ARTERY AND ENDOSCOPICALLY HARVESTED RIGHT GREATER SAPHENOUS VEIN;  Surgeon: Lucas Dorise POUR, MD;  Location: [facility] OR;  Service: Open Heart Surgery;  Laterality: N/A;   CORONARY/GRAFT ACUTE MI REVASCULARIZATION N/A 10/27/2019   Procedure: Coronary/Graft Acute MI Revascularization;  Surgeon: Darron Deatrice LABOR, MD;  Location: [facility] INVASIVE CV LAB;  Service: Cardiovascular;  Laterality: N/A;   HEMICOLECTOMY     12/17/2009 right   LEFT HEART CATH AND CORONARY ANGIOGRAPHY N/A 10/27/2019   Procedure: LEFT HEART CATH AND CORONARY ANGIOGRAPHY;  Surgeon: Darron Deatrice LABOR, MD;  Location: [facility] INVASIVE CV LAB;  Service: Cardiovascular;  Laterality: N/A;   LOOP RECORDER INSERTION N/A 10/21/2020   Procedure: LOOP RECORDER INSERTION;  Surgeon: Kelsie Agent, MD;  Location: [facility] INVASIVE CV LAB;  Service: Cardiovascular;  Laterality: N/A;   POLYPECTOMY     RIGHT/LEFT HEART CATH AND CORONARY ANGIOGRAPHY Bilateral 08/12/2023   Procedure: RIGHT/LEFT HEART CATH AND CORONARY  ANGIOGRAPHY;  Surgeon: Darron Deatrice LABOR, MD;  Location: [facility] INVASIVE CV LAB;  Service: Cardiovascular;  Laterality: Bilateral;   TEE WITHOUT CARDIOVERSION N/A 10/21/2020   Procedure: TRANSESOPHAGEAL ECHOCARDIOGRAM (TEE);  Surgeon: Mona Vinie BROCKS, MD;  Location: [facility] ENDOSCOPY;  Service: Cardiovascular;  Laterality: N/A;   TEE WITHOUT CARDIOVERSION N/A 09/19/2023   Procedure: TRANSESOPHAGEAL ECHOCARDIOGRAM (TEE);  Surgeon: Lucas Dorise POUR, MD;  [facility];  Service: Open Heart Surgery;  Laterality: N/A;   Patient Active Problem List   Diagnosis Date Noted   CVA (cerebral vascular accident) (HCC) 06/21/2024   S/P CABG x 2 09/22/2023   Coronary artery disease 09/19/2023   Annual physical exam 05/20/2023   Screen for colon cancer 05/20/2023   History of vitamin D  deficiency 05/20/2023   Acute reaction to situational stress 05/20/2023   Psychophysiological insomnia 03/10/2022   Hypertension associated with diabetes (HCC) 11/25/2021   Hyperlipidemia associated with type 2 diabetes mellitus (HCC) 11/25/2021   Diabetes mellitus (HCC) 11/25/2021   History of stroke 10/21/2020   Chronic systolic congestive heart failure (HCC) 11/30/2019   Coronary artery disease involving native coronary artery 11/30/2019   History of ST elevation myocardial infarction (STEMI) 10/27/2019   Psoriatic arthritis (HCC) 08/25/2017   History of colon cancer 01/02/2010   Esophageal reflux 11/04/2009    ONSET DATE: 06/21/24  REFERRING DIAG: CVA with left Hemiparesis, and sensory loss  THERAPY DIAG:  Muscle weakness (generalized)  Other lack of coordination  Rationale for Evaluation and Treatment: Rehabilitation  SUBJECTIVE:   SUBJECTIVE STATEMENT: Pt. was present with a friend Pt accompanied by: self and friend  PERTINENT HISTORY: ***  PRECAUTIONS: None  WEIGHT BEARING RESTRICTIONS: No  PAIN Are you having pain? No  FALLS: Has patient fallen in last 6 months? Yes, repots 4-5  falls recently  LIVING ENVIRONMENT: Lives with: Alone Stairs: 1-2 stes to enter Has following equipment at home: Rollator walker with 3 wheels  PLOF: Reports having had a previous CVA  PATIENT GOALS: To have the sensation in the LUE completely resolve  OBJECTIVE:  Note: Objective measures were completed at Evaluation unless otherwise noted.  HAND DOMINANCE: Left hand dominant.  ADLs: Overall ADLs: Pt. reports that his LUE is numb, therefore he is completely unable to use it during any ADL/ADL tasks. Pt. reports that he is using the RUE for all ADL tasks. Pt. reports having to use his RUE to support his LUE when attempting to pick up, or hold a drink. Due to agitation, Pt. Did not provide additional information about ADL functioning.   Transfers/ambulation related to ADLs: Eating:  Grooming:  UB Dressing:  LB Dressing:  Toileting:  Bathing:  Tub Shower transfers:  IADLs: Shopping:  Light housekeeping:  Meal Prep:  Community mobility:  Medication management:  Financial management:  Handwriting:   MOBILITY  STATUS: Pt. Arrived to the clinic with a 3 wheeled rollator walker.     FUNCTIONAL OUTCOME MEASURES: Eval: MAM-20: Not assessed 2/2 agitation  UPPER EXTREMITY ROM:    Active ROM Right Eval WFL Left eval  Shoulder flexion  132  Shoulder abduction  136  Shoulder adduction    Shoulder extension    Shoulder internal rotation    Shoulder external rotation    Elbow flexion  WFL  Elbow extension  4  Wrist flexion    Wrist extension  70  Wrist ulnar deviation  32  Wrist radial deviation  18  Wrist pronation    Wrist supination    (Blank rows = not tested)  UPPER EXTREMITY MMT:     MMT Right eval Left eval  Shoulder flexion    Shoulder abduction    Shoulder adduction    Shoulder extension    Shoulder internal rotation    Shoulder external rotation    Middle trapezius    Lower trapezius    Elbow flexion    Elbow extension    Wrist flexion    Wrist  extension    Wrist ulnar deviation    Wrist radial deviation    Wrist pronation    Wrist supination    (Blank rows = not tested)  HAND FUNCTION: Grip strength: Right: 90 lbs; Left: 38 lbs, Lateral pinch: Right: 15 lbs, Left: 12 lbs, and 3 point pinch: Right: 13 lbs, Left: 10 lbs  COORDINATION: 9 Hole Peg test: Right: 36 sec; Left: Pt. was able to formulate a 2pt. grasp pattern with the left hand in anticipation for grasping the 9 hole pegs from a horizontal position, however was unable to secure a grasp on any of the pegs.  SENSATION: LUE Light touch: Impaired  Proprioception: Impaired   EDEMA: N/A  MUSCLE TONE: WFL  COGNITION: Overall cognitive status:  Difficult to assess 2/2 agitation, and perseveration on sensory loss and timeline/prognosis sensroy return following the CVA.  VISION: N/T 2/2 agitation  PRAXIS: Impaired: Motor planning                                                                                 TREATMENT DATE: 06/28/24   Therapeutic Ex.:   -Blue Theraputty left hand strengthening exercises including: gross gripping, gross digit extension, lateral, and 3pt. pinch strengthening, thumb opposition, bilateral alternating twisting motion in opposite directions with supination/pronation, rolling circular spheres within the tips of the fingers, and manipulating putty within the tips of fingers to remove small objects. Pt. Was provided with a visual handout HEP through [employer] with a video access code.    PATIENT EDUCATION: Education details: OT services, OT role in recovery of sensory loss following a CVA, POC, goals and ADL/IADL functional Status., Left hand strengthening using theraputty HEP Person educated: Patient, Caregiver: Friend Education method: Explanation, Demonstration, Verbal cues, and Handouts Education comprehension: verbalized understanding, returned demonstration, verbal cues required, tactile cues required, and needs further education  HOME  EXERCISE PROGRAM:   -Blue theraputty hand strengthening exercises with a HEP through [employer]  GOALS: Goals reviewed with patient? Yes  SHORT TERM GOALS: Target date: 08/09/2024    Pt. Will be independent  with HEPs for LUE strength, and [facility]  Baseline: Eval: Pt. education was provided about a HEP for the LUE. Goal status: INITIAL  LONG TERM GOALS: Target date: 09/20/2024  Pt. Will improve Left hand grip strength by 5# to be able to be able to securely hold items in his left hand Baseline: Eval: Right: 90 lbs, Left: 38# Goal status: INITIAL  2.  Pt. Will improve left hand pinch strength to be able to daily care items with lids. Baseline: Eval: Lteral pinch strength: Right: 15#, Left: 12# 3pt. Pinch strength: Right: 13#, Left: 10# Goal status: INITIAL  3.  Pt. Will improve left hand [facility] skills to be able to grasp, manipulate and place 2 pegs on the 9 hole peg test Baseline: Eval: Right: 36 sec  Left:  Pt. was able to formulate a 2pt. grasp pattern with the left hand in anticipation for grasping the 9 hole pegs from a horizontal position, however was unable to secure a grasp on any of the pegs. Goal status: INITIAL  4.  Pt. Will identify 3 compensatory strategies for decreased sensation during ADLs, and IADL tasks. Baseline: Eval: Education to be provided Goal status: INITIAL   ASSESSMENT:  CLINICAL IMPRESSION:  Patient is a 63 y.o. male who was seen today for occupational therapy evaluation for CVA with dominant left sided hemiparesis and sensory loss. Pt. Brought a personal 3 wheeled rollator walker to the hospital, however adamantly refused to use it, or have any assistance walking to the therapy gym despite a history of multiple falls recently. Pt. Had no episodes of LOB walking to and from the waiting area. Pt. presents with agitation, sensory loss in the dominant LUE, LUE weakness, decreased left grip strength, decreased left pinch strength, and impaired left hand [facility] skills. Pt.  Presents to the clinic with a very low frustation/agitation threshold, behavioral issues, and decreased health literacy which may affect carry-over of learned information, is a barrier to a thorough evaluation/assessment, and may potentially be barriers to functional rehab outcomes. Pt. requires extensive education, and reinforcement about OT's role in recovery following sensory loss from a CVA as pt. excessively perseverates on this. Pt. Education was provided about the Left grip/pinch strength weakness, and incoordination caused by the CVA and OTs role in recovery of those skills, as well as OTs role with education about compensatory strategies during ADLs/IADLs for sensory loss following a CVA. Pt. reports that he was referred for Occupational therapy because he was told they would make the sensory loss in his left side improve, and go away. Pt. Reported  that he was misled by this.  It was recommended that the Pt. follow-up with his Physician for information about prognosis for sensory return in the  left side with his specific CVA. Pt. was provided with a HEP for left hand strengthening  through [employer] using blue therapy. Pt. MAM-20 outcome measure not completed 2/2 agitation. Pt. Will benefit from OT services to work on improving dominant LUE functioning in order to increase engagement during ADLs, and IADL tasks, as well as to provide education about compensatory strategies for LUE sensory loss during ADLs, and IADL tasks.  PERFORMANCE DEFICITS: in functional skills including {OT physical skills:25468}, cognitive skills including {OT cognitive skills:25469}, and psychosocial skills including {OT psychosocial skills:25470}.   IMPAIRMENTS: are limiting patient from {OT performance deficits:25471}.   CO-MORBIDITIES: {Comorbidities:25485} that affects occupational performance. Patient will benefit from skilled OT to address above impairments and improve overall function.  MODIFICATION OR ASSISTANCE TO  COMPLETE EVALUATION: {OT modification:25474}  OT OCCUPATIONAL PROFILE AND HISTORY: {OT PROFILE AND HISTORY:25484}  CLINICAL DECISION MAKING: {OT CDM:25475}  REHAB POTENTIAL: {rehabpotential:25112}  EVALUATION COMPLEXITY: {Evaluation complexity:25115}    PLAN:  OT FREQUENCY: {rehab frequency:25116}  OT DURATION: {rehab duration:25117}  PLANNED INTERVENTIONS: {OT Interventions:25467}  RECOMMENDED OTHER SERVICES: ***  CONSULTED AND AGREED WITH PLAN OF CARE: {ENR:74513}  PLAN FOR NEXT SESSION: PIERRETTE Richardson Otter, OT 06/28/2024, 9:35 PM

## 2024-06-28 NOTE — Therapy (Signed)
 OUTPATIENT PHYSICAL THERAPY NEURO EVALUATION   Patient Name: [name] MRN: [id number] DOB:Dec 01, 1960, 63 y.o., male Today's Date: 06/28/2024   PCP: Aletha Bene, MD   REFERRING PROVIDER: Rosemarie Eather RAMAN, MD   END OF SESSION:  PT End of Session - 06/28/24 1502     Visit Number 1    Number of Visits 25    Date for Recertification  09/20/24    PT Start Time 1520    PT Stop Time 1607    PT Time Calculation (min) 47 min    Equipment Utilized During Treatment Gait belt    Activity Tolerance Patient tolerated treatment well          Past Medical History:  Diagnosis Date   Anemia 10/03/2009   Qualifier: Diagnosis of  By: Georgian ROSALEA CHARM Lamar    Anxiety associated with depression 10/11/2013   pt states he is no longer having this issue because he is happy with his job now   Colon cancer [facility])    right colon cancer- adenocarcinoma CEA level isnrmal at 1.8   Coronary artery disease    Diabetes mellitus    Diabetes mellitus type 2 in obese 03/20/2010   Qualifier: Diagnosis of  By: Georgian ROSALEA CHARM Lamar     Erectile dysfunction 06/22/2016   Essential hypertension 06/05/2014   FATTY LIVER DISEASE 11/04/2009   Qualifier: Diagnosis of  By: Abran MD, Norleen SAILOR   Qualifier: Diagnosis of  By: Abran MD, Norleen SAILOR  Last Assessment & Plan:  conirmed by CT scan of abdomen in April 2016 encouraged to minimize simple carbs and fatty foods.   GERD 11/04/2009   Qualifier: Diagnosis of  By: Abran MD, Norleen SAILOR    Great toe pain, right 04/18/2017   Hepatic artery stenosis    History of colon cancer 01/02/2010   Qualifier: Diagnosis of  By: Georgian ROSALEA CHARM Lamar Dr Abran  Last Assessment & Plan:  Follows closely with gastroenterology, Dr Abran   Hyperlipidemia    Hyperlipidemia, mixed 09/07/2010   Qualifier: Diagnosis of  By: Georgian ROSALEA CHARM Lamar    Hypertension    Hypertriglyceridemia 12/14/2010   Hypotestosteronism 06/22/2016   INSOMNIA, CHRONIC 03/20/2010   Qualifier: Diagnosis of  By: Georgian ROSALEA CHARM Lamar Ambien  10 mg daily does not keep asleep AdvilPM is over sedating and has trouble waking up    Iron  deficiency anemia 2011   Low testosterone  06/22/2016   Malignant neoplasm of colon (HCC) 01/02/2010   Qualifier: Diagnosis of  By: Georgian ROSALEA CHARM Lamar Dr Abran    Muscle spasm of back 06/05/2014   Nipple pain 09/30/2016   Obesity    Palpitations 06/05/2014   Preventative health care 10/08/2012   Psoriasis    Psoriatic arthritis (HCC) 08/25/2017   Right knee pain 11/17/2011   Stroke [facility])    Past Surgical History:  Procedure Laterality Date   BUBBLE STUDY  10/21/2020   Procedure: BUBBLE STUDY;  Surgeon: Mona Vinie BROCKS, MD;  Location: [facility] ENDOSCOPY;  Service: Cardiovascular;;   CARPAL TUNNEL RELEASE     RIGHT   CHOLECYSTECTOMY  1994   COLONOSCOPY     CORONARY ARTERY BYPASS GRAFT N/A 09/19/2023   Procedure: CORONARY ARTERY BYPASS GRAFTING (CABG) TIMES TWO USING LEFT INTERNAL MAMMARY ARTERY AND ENDOSCOPICALLY HARVESTED RIGHT GREATER SAPHENOUS VEIN;  Surgeon: Lucas Dorise POUR, MD;  Location: [facility] OR;  Service: Open Heart Surgery;  Laterality: N/A;   CORONARY/GRAFT ACUTE MI REVASCULARIZATION N/A 10/27/2019   Procedure: Coronary/Graft  Acute MI Revascularization;  Surgeon: Darron Deatrice LABOR, MD;  Location: [facility] INVASIVE CV LAB;  Service: Cardiovascular;  Laterality: N/A;   HEMICOLECTOMY     12/17/2009 right   LEFT HEART CATH AND CORONARY ANGIOGRAPHY N/A 10/27/2019   Procedure: LEFT HEART CATH AND CORONARY ANGIOGRAPHY;  Surgeon: Darron Deatrice LABOR, MD;  Location: [facility] INVASIVE CV LAB;  Service: Cardiovascular;  Laterality: N/A;   LOOP RECORDER INSERTION N/A 10/21/2020   Procedure: LOOP RECORDER INSERTION;  Surgeon: Kelsie Agent, MD;  Location: [facility] INVASIVE CV LAB;  Service: Cardiovascular;  Laterality: N/A;   POLYPECTOMY     RIGHT/LEFT HEART CATH AND CORONARY ANGIOGRAPHY Bilateral 08/12/2023   Procedure: RIGHT/LEFT HEART CATH AND CORONARY ANGIOGRAPHY;  Surgeon: Darron Deatrice LABOR, MD;  Location: [facility]  INVASIVE CV LAB;  Service: Cardiovascular;  Laterality: Bilateral;   TEE WITHOUT CARDIOVERSION N/A 10/21/2020   Procedure: TRANSESOPHAGEAL ECHOCARDIOGRAM (TEE);  Surgeon: Mona Vinie BROCKS, MD;  Location: [facility] ENDOSCOPY;  Service: Cardiovascular;  Laterality: N/A;   TEE WITHOUT CARDIOVERSION N/A 09/19/2023   Procedure: TRANSESOPHAGEAL ECHOCARDIOGRAM (TEE);  Surgeon: Lucas Dorise POUR, MD;  Location: [facility] OR;  Service: Open Heart Surgery;  Laterality: N/A;   Patient Active Problem List   Diagnosis Date Noted   CVA (cerebral vascular accident) (HCC) 06/21/2024   S/P CABG x 2 09/22/2023   Coronary artery disease 09/19/2023   Annual physical exam 05/20/2023   Screen for colon cancer 05/20/2023   History of vitamin D  deficiency 05/20/2023   Acute reaction to situational stress 05/20/2023   Psychophysiological insomnia 03/10/2022   Hypertension associated with diabetes (HCC) 11/25/2021   Hyperlipidemia associated with type 2 diabetes mellitus (HCC) 11/25/2021   Diabetes mellitus (HCC) 11/25/2021   History of stroke 10/21/2020   Chronic systolic congestive heart failure (HCC) 11/30/2019   Coronary artery disease involving native coronary artery 11/30/2019   History of ST elevation myocardial infarction (STEMI) 10/27/2019   Psoriatic arthritis (HCC) 08/25/2017   History of colon cancer 01/02/2010   Esophageal reflux 11/04/2009    ONSET DATE: 06/21/2024  REFERRING DIAG:  I63.9 (ICD-10-CM) - Cryptogenic stroke (HCC)  R29.818 (ICD-10-CM) - Hemisensory deficit    THERAPY DIAG:  Cerebrovascular accident (CVA), unspecified mechanism (HCC)  Muscle weakness of lower extremity  Other abnormalities of gait and mobility  Unsteadiness on feet  Rationale for Evaluation and Treatment: Rehabilitation  SUBJECTIVE:                                                                                                                                                                                              SUBJECTIVE STATEMENT: Patient began to have symptoms  of stroke the night of 06/20/2024, but did not go to ER until 06/21/2024. He was noted to have L sided weakness along with decreased sensation. Pt. Did not receive tpa at hospital because it was too late. Patient reports he was in hospital for one day then released. He continues to have numbness and weakness on L side. Pt initially had a stroke effecting R side about 2-3 years ago that he was still dealing with including decreased balance and right sided weakness. He was using cane prior to this most recent stroke for ambulation, but is now using 3 wheeled walker. Patient reports he gets fatigued very easily with simple tasks. He also reports intermittent tremor in L leg.    Pt accompanied by: friend.   PERTINENT HISTORY: Patient experienced a right sided stroke on 06/20/2024 resulting in L hemiparesis and loss of sensation.  PAIN:  Are you having pain? No  PRECAUTIONS: Fall  RED FLAGS: None   WEIGHT BEARING RESTRICTIONS: No  FALLS: Has patient fallen in last 6 months? Yes. Number of falls 5x on 06/21/2024.   LIVING ENVIRONMENT: Lives with: lives with their family and lives alone Lives in: House/apartment Stairs: Yes: External: 2 steps; none Has following equipment at home: Single point cane, Environmental Consultant - 2 wheeled, Wheelchair (manual), shower chair, and Grab bars  PLOF: Independent  PATIENT GOALS: Improve LE strength, gait, balance, independence with mobility and functional tasks at home, and yard work.   OBJECTIVE:  Note: Objective measures were completed at Evaluation unless otherwise noted.  DIAGNOSTIC FINDINGS: IMPRESSION: 1. Multiple small acute ischemic infarcts within the right hemisphere affecting parts of the right parietal cortex, right temporal cortex, and the right occipital lobe, with punctate foci of acute ischemia in the right frontal white matter. 2. Multifocal hyperintense T2-weighted signal within the cerebral  white matter, most commonly due to chronic small vessel disease.    COGNITION: Overall cognitive status: Within functional limits for tasks assessed   SENSATION:L lower extremity.  Light touch: Impaired  Proprioception: Impaired   COORDINATION: Decreased speed and accuracy with toe tap.    MUSCLE TONE: LLE: Within functional limits   POSTURE: rounded shoulders  LOWER EXTREMITY ROM:     Active  Right Eval Left Eval  Hip flexion    Hip extension    Hip abduction    Hip adduction    Hip internal rotation    Hip external rotation    Knee flexion    Knee extension    Ankle dorsiflexion    Ankle plantarflexion    Ankle inversion    Ankle eversion     (Blank rows = not tested)  LOWER EXTREMITY MMT:    MMT Right Eval Left Eval  Hip flexion 4 4  Hip extension    Hip abduction 5 4-  Hip adduction    Hip internal rotation    Hip external rotation    Knee flexion 5 5  Knee extension 5 5  Ankle dorsiflexion 5 5  Ankle plantarflexion    Ankle inversion    Ankle eversion    (Blank rows = not tested)  BED MOBILITY:  Independent  TRANSFERS: Independent  RAMP:  Not tested  CURB:  Not tested  STAIRS: Not tested GAIT: Findings: Gait Characteristics: ataxic, Distance walked: 100', and Comments: Very slight ataxia with gait.  FUNCTIONAL TESTS:  5 times sit to stand: 23.47 Timed up and go (TUG): 13.4 without AD. 10 MWT:0.77 m/s without AD. 6 minute walk test: Perform at  initial treatment. Berg Balance Scale:  Item Test date: 06/28/2024 Date:  Date:   Sitting to standing 4. able to stand without using hands and stabilize independently Insert SmartPhrase OPRCBERGREEVAL Insert SmartPhrase OPRCBERGREEVAL  2. Standing unsupported 4. able to stand safely for 2 minutes    3. Sitting with back unsupported, feet supported 4. able to sit safely and securely for 2 minutes    4. Standing to sitting 3. controls descent by using hands    5. Pivot transfer  4. able to  transfer safely with minor use of hands    6. Standing unsupported with eyes closed 4. able to stand 10 seconds safely    7. Standing unsupported with feet together 4. able to place feet together independently and stand 1 minute safely    8. Reaching forward with outstretched arms while standing 4. can reach forward confidently 25 cm (10 inches)    9. Pick up object from the floor from standing 3. able to pick up slipper but needs supervision    10. Turning to look behind over left and right shoulders while standing 3. looks behind one side only, other side shows less weight shift    11. Turn 360 degrees 3. able to turn 360 degrees safely, one side only, in 4 seconds or less    12. Place alternate foot on step or stool while standing unsupported 4. ble to stand independently and safely and complete 8 steps in 20 seconds    13. Standing unsupported one foot in front 3. able to place foot ahead independently and hold 30 seconds    14. Standing on one leg 4. able to lift leg independently and hold > 10 seconds     Total Score 51/56 Total Score:    Total Score:      PATIENT SURVEYS:  ABC scale: The Activities-Specific Balance Confidence (ABC) Scale 0% 10 20 30  40 50 60 70 80 90 100% No confidence<->completely confident  How confident are you that you will not lose your balance or become unsteady when you . . .   Date tested 06/28/2024  Walk around the house 50%  2. Walk up or down stairs 0%  3. Bend over and pick up a slipper from in front of a closet floor 30%  4. Reach for a small can off a shelf at eye level 30%  5. Stand on tip toes and reach for something above your head 30%  6. Stand on a chair and reach for something 0%  7. Sweep the floor 0%  8. Walk outside the house to a car parked in the driveway 40%  9. Get into or out of a car 50%  10. Walk across a parking lot to the mall 40%  11. Walk up or down a ramp 20%  12. Walk in a crowded mall where people rapidly walk past you 20%   13. Are bumped into by people as you walk through the mall 0%  14. Step onto or off of an escalator while you are holding onto the railing 50%  15. Step onto or off an escalator while holding onto parcels such that you cannot hold onto the railing 0%  16. Walk outside on icy sidewalks 0%  Total: #/16 22.5%  TREATMENT DATE: 15/05/2024  HEP education, demonstration, and recall.     PATIENT EDUCATION: Education details: HEP, safety in home, stroke education. Person educated: Patient and patient's friend Education method: Explanation, Demonstration, Verbal cues, and Handouts Education comprehension: verbalized understanding and returned demonstration  HOME EXERCISE PROGRAM: Access Code: [email or web address] URL: [email or web address] Date: 06/28/2024 Prepared by: Norman Sharps  Exercises - Sit to Stand with Arms Crossed  - 1 x daily - 7 x weekly - 2 sets - 10 reps - Standing Tandem Balance with Counter Support  - 1 x daily - 7 x weekly - 1 sets - 3 reps - 30 second hold - Single Leg Stance  - 1 x daily - 7 x weekly - 1 sets - 3 reps - 20 second hold  GOALS: Goals reviewed with patient? Yes  SHORT TERM GOALS: Target date: 07/12/2024  09/21/2023 Baseline: Patient will be independent with HEP by discharge allowing him to perform exercises at home with decreased difficulty and improved safety.  Goal status: INITIAL    LONG TERM GOALS: Target date: 09/20/2024  1.  Patient (> 46 years old) will complete five times sit to stand test in < 15 seconds indicating an increased LE strength and improved balance. Baseline: 23.47 without AD. Goal status: INITIAL  2.  Patient will increase ABC Scale by 40% by discharge allowing him to ambulate and perform functional activities with decreased risk of falling.  Baseline: 22.5%. Goal status: INITIAL   3.   Patient will reduce timed up and go to <11 seconds to reduce fall risk and demonstrate improved transfer/gait ability. Baseline: 13.4'' without Ad. Goal status: INITIAL  4.  Patient will increase 10 meter walk test to >1.22m/s as to improve gait speed for better community ambulation and to reduce fall risk. Baseline: 0.77 meters/second Goal status: INITIAL  5.  Patient will increase six minute walk test distance to >1000 for progression to community ambulator and improve gait ability Baseline: Not test at evaluation. Goal status: INITIAL    ASSESSMENT:  CLINICAL IMPRESSION: Patient is a 63 y.o. male who was seen today for physical therapy evaluation and treatment for L sided weakness and sensory loss s/p R sided CVA 06/20/2024.  Objectively, patient is noted to have decreased L sided sensation in LE, decreased LE strength with MMT, decreased LE coordination, decreased gait speed, gait abnormalities, decreased balance, and decreased confidence in balance.  The deficits listed above make it difficult for patient to perform basic tasks such as walking, maintain balance, performing stairs, ambulating in community, and performing functional ADLs at home. Pt will benefit from skilled PT 2x/week to improve on these deficits allowing him to perform the activities listed with decreased difficulty and decreased risk for falls.   OBJECTIVE IMPAIRMENTS: Abnormal gait, decreased activity tolerance, decreased balance, decreased coordination, decreased endurance, difficulty walking, decreased strength, impaired sensation, and impaired UE functional use.   ACTIVITY LIMITATIONS: carrying, lifting, bending, standing, squatting, stairs, transfers, and bed mobility  PARTICIPATION LIMITATIONS: meal prep, cleaning, laundry, driving, shopping, community activity, and yard work  PERSONAL FACTORS: 1-2 comorbidities: HTN, CHF, CAD, CVA are also affecting patient's functional outcome.   REHAB POTENTIAL:  Good  CLINICAL DECISION MAKING: Stable/uncomplicated  EVALUATION COMPLEXITY: Moderate  PLAN:  PT FREQUENCY: 1-2x/week  PT DURATION: 12 weeks  PLANNED INTERVENTIONS: [id number]- PT Re-evaluation, 97750- Physical Performance Testing, 97110-Therapeutic exercises, 97530- Therapeutic activity, [id number]- Neuromuscular re-education, 97535- Self Care, [id number]- Manual therapy, [phone]- Gait training, Patient/Family education, Balance training, Stair training, and Vestibular training  PLAN  FOR NEXT SESSION:  -Perform 6 minute walk test.  -Continue working on confidence with gait -Continue working to improve dynamic balance and LE coordination.   Norman Sharps, PT, DPT Physical Therapist - [facility]   Norman KATHEE Sharps, [place] 06/28/2024, 4:47 PM

## 2024-06-28 NOTE — Therapy (Signed)
 OUTPATIENT OCCUPATIONAL THERAPY NEURO EVALUATION  Patient Name: [name] MRN: [id number] DOB:1961-04-25, 63 y.o., male Today's Date: 06/28/2024  PCP: Aletha Bene, MD REFERRING PROVIDER: Rosemarie Eather RAMAN, MD  END OF SESSION:  OT End of Session - 06/28/24 2125     Visit Number 1    Number of Visits 12    Date for Recertification  09/20/24    OT Start Time 1615    OT Stop Time 1710    OT Time Calculation (min) 55 min    Activity Tolerance Patient tolerated treatment well    Behavior During Therapy Agitated;WFL for tasks assessed/performed          Past Medical History:  Diagnosis Date   Anemia 10/03/2009   Qualifier: Diagnosis of  By: Georgian ROSALEA CHARM Lamar    Anxiety associated with depression 10/11/2013   pt states he is no longer having this issue because he is happy with his job now   Colon cancer [facility])    right colon cancer- adenocarcinoma CEA level isnrmal at 1.8   Coronary artery disease    Diabetes mellitus    Diabetes mellitus type 2 in obese 03/20/2010   Qualifier: Diagnosis of  By: Georgian ROSALEA CHARM Lamar     Erectile dysfunction 06/22/2016   Essential hypertension 06/05/2014   FATTY LIVER DISEASE 11/04/2009   Qualifier: Diagnosis of  By: Abran MD, Norleen SAILOR   Qualifier: Diagnosis of  By: Abran MD, Norleen SAILOR  Last Assessment & Plan:  conirmed by CT scan of abdomen in April 2016 encouraged to minimize simple carbs and fatty foods.   GERD 11/04/2009   Qualifier: Diagnosis of  By: Abran MD, Norleen SAILOR    Great toe pain, right 04/18/2017   Hepatic artery stenosis    History of colon cancer 01/02/2010   Qualifier: Diagnosis of  By: Georgian ROSALEA CHARM Lamar Dr Abran  Last Assessment & Plan:  Follows closely with gastroenterology, Dr Abran   Hyperlipidemia    Hyperlipidemia, mixed 09/07/2010   Qualifier: Diagnosis of  By: Georgian ROSALEA CHARM Lamar    Hypertension    Hypertriglyceridemia 12/14/2010   Hypotestosteronism 06/22/2016   INSOMNIA, CHRONIC 03/20/2010   Qualifier: Diagnosis of   By: Georgian ROSALEA CHARM Lamar Ambien  10 mg daily does not keep asleep AdvilPM is over sedating and has trouble waking up    Iron  deficiency anemia 2011   Low testosterone  06/22/2016   Malignant neoplasm of colon (HCC) 01/02/2010   Qualifier: Diagnosis of  By: Georgian ROSALEA CHARM Lamar Dr Abran    Muscle spasm of back 06/05/2014   Nipple pain 09/30/2016   Obesity    Palpitations 06/05/2014   Preventative health care 10/08/2012   Psoriasis    Psoriatic arthritis (HCC) 08/25/2017   Right knee pain 11/17/2011   Stroke [facility])    Past Surgical History:  Procedure Laterality Date   BUBBLE STUDY  10/21/2020   Procedure: BUBBLE STUDY;  Surgeon: Mona Vinie BROCKS, MD;  Location: [facility] ENDOSCOPY;  Service: Cardiovascular;;   CARPAL TUNNEL RELEASE     RIGHT   CHOLECYSTECTOMY  1994   COLONOSCOPY     CORONARY ARTERY BYPASS GRAFT N/A 09/19/2023   Procedure: CORONARY ARTERY BYPASS GRAFTING (CABG) TIMES TWO USING LEFT INTERNAL MAMMARY ARTERY AND ENDOSCOPICALLY HARVESTED RIGHT GREATER SAPHENOUS VEIN;  Surgeon: Lucas Dorise POUR, MD;  Location: [facility] OR;  Service: Open Heart Surgery;  Laterality: N/A;   CORONARY/GRAFT ACUTE MI REVASCULARIZATION N/A 10/27/2019   Procedure: Coronary/Graft Acute MI Revascularization;  Surgeon: Darron Deatrice LABOR, MD;  Location: [facility] INVASIVE CV LAB;  Service: Cardiovascular;  Laterality: N/A;   HEMICOLECTOMY     12/17/2009 right   LEFT HEART CATH AND CORONARY ANGIOGRAPHY N/A 10/27/2019   Procedure: LEFT HEART CATH AND CORONARY ANGIOGRAPHY;  Surgeon: Darron Deatrice LABOR, MD;  Location: [facility] INVASIVE CV LAB;  Service: Cardiovascular;  Laterality: N/A;   LOOP RECORDER INSERTION N/A 10/21/2020   Procedure: LOOP RECORDER INSERTION;  Surgeon: Kelsie Agent, MD;  Location: [facility] INVASIVE CV LAB;  Service: Cardiovascular;  Laterality: N/A;   POLYPECTOMY     RIGHT/LEFT HEART CATH AND CORONARY ANGIOGRAPHY Bilateral 08/12/2023   Procedure: RIGHT/LEFT HEART CATH AND CORONARY ANGIOGRAPHY;  Surgeon: Darron Deatrice LABOR, MD;   Location: [facility] INVASIVE CV LAB;  Service: Cardiovascular;  Laterality: Bilateral;   TEE WITHOUT CARDIOVERSION N/A 10/21/2020   Procedure: TRANSESOPHAGEAL ECHOCARDIOGRAM (TEE);  Surgeon: Mona Vinie BROCKS, MD;  Location: [facility] ENDOSCOPY;  Service: Cardiovascular;  Laterality: N/A;   TEE WITHOUT CARDIOVERSION N/A 09/19/2023   Procedure: TRANSESOPHAGEAL ECHOCARDIOGRAM (TEE);  Surgeon: Lucas Dorise POUR, MD;  Location: [facility] OR;  Service: Open Heart Surgery;  Laterality: N/A;   Patient Active Problem List   Diagnosis Date Noted   CVA (cerebral vascular accident) (HCC) 06/21/2024   S/P CABG x 2 09/22/2023   Coronary artery disease 09/19/2023   Annual physical exam 05/20/2023   Screen for colon cancer 05/20/2023   History of vitamin D  deficiency 05/20/2023   Acute reaction to situational stress 05/20/2023   Psychophysiological insomnia 03/10/2022   Hypertension associated with diabetes (HCC) 11/25/2021   Hyperlipidemia associated with type 2 diabetes mellitus (HCC) 11/25/2021   Diabetes mellitus (HCC) 11/25/2021   History of stroke 10/21/2020   Chronic systolic congestive heart failure (HCC) 11/30/2019   Coronary artery disease involving native coronary artery 11/30/2019   History of ST elevation myocardial infarction (STEMI) 10/27/2019   Psoriatic arthritis (HCC) 08/25/2017   History of colon cancer 01/02/2010   Esophageal reflux 11/04/2009    ONSET DATE: 06/21/24  REFERRING DIAG: CVA with left Hemiparesis, and sensory loss  THERAPY DIAG:  Muscle weakness (generalized)  Other lack of coordination  Rationale for Evaluation and Treatment: Rehabilitation  SUBJECTIVE:   SUBJECTIVE STATEMENT: Pt. was present for the initial evaluation with a friend, Andree Pt accompanied by: self and friend  PERTINENT HISTORY: Pt. is a 63 y.o male who was admitted to the hospital from 12/4-12/11/2023 with a CVA with left hemiparesis, and sensory loss. Imaging revealed an acute right parietal, temporal, and  occipital Ischemic CVA. PMHx includes: HTN associated with DM, Hyperlipidemia associated with DM, CAD, s/p CABG, Hx of CVA.  PRECAUTIONS: None  WEIGHT BEARING RESTRICTIONS: No  PAIN Are you having pain? No  FALLS: Has patient fallen in last 6 months? Yes, repots 4-5 falls recently  LIVING ENVIRONMENT: Lives with: Alone Stairs: 1-2 stes to enter Has following equipment at home: Rollator walker with 3 wheels  PLOF: Reports having had a previous CVA  PATIENT GOALS: To have the sensation in the LUE completely resolve  OBJECTIVE:  Note: Objective measures were completed at Evaluation unless otherwise noted.  HAND DOMINANCE: Left hand dominant.  ADLs: Overall ADLs: Pt. reports that his LUE is numb, therefore he is completely unable to use it during any ADL/ADL tasks. Pt. reports that he is using the RUE for all ADL tasks. Pt. reports having to use his RUE to support his LUE when attempting to pick up, or hold a drink. Due to agitation, Pt.  Did not provide additional information about ADL functioning.   Transfers/ambulation related to ADLs: Eating:  Grooming:  UB Dressing:  LB Dressing:  Toileting:  Bathing:  Tub Shower transfers:  IADLs: Shopping:  Light housekeeping:  Meal Prep:  Community mobility:  Medication management:  Financial management:  Handwriting:   MOBILITY STATUS: Pt. Arrived to the clinic with a 3 wheeled rollator walker.     FUNCTIONAL OUTCOME MEASURES: Eval: MAM-20: Not assessed 2/2 agitation  UPPER EXTREMITY ROM:    Active ROM Right Eval WFL Left eval  Shoulder flexion  132  Shoulder abduction  136  Shoulder adduction    Shoulder extension    Shoulder internal rotation    Shoulder external rotation    Elbow flexion  WFL  Elbow extension  4  Wrist flexion    Wrist extension  70  Wrist ulnar deviation  32  Wrist radial deviation  18  Wrist pronation    Wrist supination    (Blank rows = not tested)  UPPER EXTREMITY MMT:     MMT  Right Eval WFL  Overall Left Eval 4-/5 overall  Shoulder flexion    Shoulder abduction    Shoulder adduction    Shoulder extension    Shoulder internal rotation    Shoulder external rotation    Middle trapezius    Lower trapezius    Elbow flexion    Elbow extension    Wrist flexion    Wrist extension    Wrist ulnar deviation    Wrist radial deviation    Wrist pronation    Wrist supination    (Blank rows = not tested)  HAND FUNCTION: Grip strength: Right: 90 lbs; Left: 38 lbs, Lateral pinch: Right: 15 lbs, Left: 12 lbs, and 3 point pinch: Right: 13 lbs, Left: 10 lbs  COORDINATION: 9 Hole Peg test: Right: 36 sec; Left: Pt. was able to formulate a 2pt. grasp pattern with the left hand in anticipation for grasping the 9 hole pegs from a horizontal position, however was unable to secure a grasp on any of the pegs.  SENSATION: LUE Light touch: Impaired  Proprioception: Impaired   EDEMA: N/A  MUSCLE TONE: WFL  COGNITION: Overall cognitive status:  Difficult to assess 2/2 agitation, and perseveration on sensory loss and timeline/prognosis sensroy return following the CVA.  VISION: N/T 2/2 agitation  PRAXIS: Impaired: Motor planning                                                                                 TREATMENT DATE: 06/28/24   Therapeutic Ex.:   -Blue Theraputty left hand strengthening exercises including: gross gripping, gross digit extension, lateral, and 3pt. pinch strengthening, thumb opposition, bilateral alternating twisting motion in opposite directions with supination/pronation, rolling circular spheres within the tips of the fingers, and manipulating putty within the tips of fingers to remove small objects. Pt. Was provided with a visual handout HEP through [employer] with a video access code.    PATIENT EDUCATION: Education details: OT services, OT role in recovery of sensory loss following a CVA, POC, goals and ADL/IADL functional Status., Left hand  strengthening using theraputty HEP Person educated: Patient, Caregiver: Friend Education method:  Explanation, Demonstration, Verbal cues, and Handouts Education comprehension: verbalized understanding, returned demonstration, verbal cues required, tactile cues required, and needs further education  HOME EXERCISE PROGRAM:   -Blue theraputty hand strengthening exercises with a HEP through [employer]  GOALS: Goals reviewed with patient? Yes  SHORT TERM GOALS: Target date: 08/09/2024    Pt. Will be independent with HEPs for LUE strength, and [facility]  Baseline: Eval: Pt. education was provided about a HEP for the LUE. Goal status: INITIAL  LONG TERM GOALS: Target date: 09/20/2024  Pt. Will improve Left hand grip strength by 5# to be able to be able to securely hold items in his left hand Baseline: Eval: Right: 90 lbs, Left: 38# Goal status: INITIAL  2.  Pt. Will improve left hand pinch strength to be able to daily care items with lids. Baseline: Eval: Lteral pinch strength: Right: 15#, Left: 12# 3pt. Pinch strength: Right: 13#, Left: 10# Goal status: INITIAL  3.  Pt. Will improve left hand [facility] skills to be able to grasp, manipulate and place 2 pegs on the 9 hole peg test Baseline: Eval: Right: 36 sec  Left:  Pt. was able to formulate a 2pt. grasp pattern with the left hand in anticipation for grasping the 9 hole pegs from a horizontal position, however was unable to secure a grasp on any of the pegs. Goal status: INITIAL  4.  Pt. Will LUE strength to assist with ADLs, and IADLs Baseline: Eval: Right: WFL, Left: 4-/5 Goal status: INITIAL  5.  Pt. Will identify 3 compensatory strategies for decreased sensation during ADLs, and IADL tasks. Baseline: Eval: Education to be provided Goal status: INITIAL   ASSESSMENT:  CLINICAL IMPRESSION:  Patient is a 63 y.o. male who was seen today for occupational therapy evaluation for CVA with dominant left sided hemiparesis and sensory loss. Pt.  brought a personal 3 wheeled rollator walker to the hospital, however adamantly refused to use it, or have any assistance walking to the therapy gym despite a history of multiple falls recently. Pt. Had no episodes of LOB walking to and from the waiting area. Pt. presents with sensory loss in the dominant LUE, LUE weakness, decreased left grip strength, decreased left pinch strength, and impaired left hand [facility] skills. Pt. Presents to the clinic with a very low frustation/agitation threshold, behavioral issues, and decreased health literacy which may affect carry-over of learned information, has been a barrier to a thorough evaluation/assessment, and may potentially be barriers to functional rehab outcomes. Pt. MAM-20 outcome measure was not completed today 2/2 agitation. Pt. requires extensive education, and reinforcement about OT's role in recovery following sensory loss from a CVA as Pt. excessively perseverated on this. Pt. Education was provided about the Left UE grip/pinch strength weakness, and incoordination caused by the CVA and OTs role in recovery of those skills to assist with ADL/IADL functioning, as well as OTs role with education about compensatory strategies for sensory loss and ADLs/IADL functioning following a CVA. Pt. reports that he was referred for Occupational therapy because he was told the services would assist in regaining sensation in his LUE and hand following the CVA. Pt. Reports that he is here to regain the sensation in his hand. Pt. Reported  that he was misled by this. It was recommended that the Pt. follow-up with his Physician for information about prognosis for sensory return in the left side with his specific CVA, and recovery. Pt. was provided with a HEP for left hand strengthening  through [employer] using  blue therapy. Pt. Was able to demonstrate proper technique, and form with cues for visual demonstration. Pt. Will benefit from OT services to work on improving dominant LUE  functioning in order to increase engagement during ADLs, and IADL tasks, as well as to provide education about compensatory strategies for LUE sensory loss during ADLs, and IADL tasks. Despite agitation/frustration, Pt. reports that he may try 1 or 2 sessions of OT.  PERFORMANCE DEFICITS: in functional skills including ADLs, IADLs, coordination, dexterity, proprioception, sensation, strength, Fine motor control, Gross motor control, decreased knowledge of use of DME, and UE functional use, and psychosocial skills including coping strategies, environmental adaptation, interpersonal interactions, and routines and behaviors.   IMPAIRMENTS: are limiting patient from ADLs, IADLs, and leisure.   CO-MORBIDITIES: may have co-morbidities  that affects occupational performance. Patient will benefit from skilled OT to address above impairments and improve overall function.  MODIFICATION OR ASSISTANCE TO COMPLETE EVALUATION: Min-Moderate modification of tasks or assist with assess necessary to complete an evaluation.  OT OCCUPATIONAL PROFILE AND HISTORY: Detailed assessment: Review of records and additional review of physical, cognitive, psychosocial history related to current functional performance.  CLINICAL DECISION MAKING: Moderate - several treatment options, min-mod task modification necessary  REHAB POTENTIAL: Good  EVALUATION COMPLEXITY: Moderate    PLAN:  OT FREQUENCY: 1 OT DURATION: 12 weeks  PLANNED INTERVENTIONS: 97168 OT Re-evaluation, 97535 self care/ADL training, 02889 therapeutic exercise, 97530 therapeutic activity, 97112 neuromuscular re-education, 97140 manual therapy, functional mobility training, energy conservation, patient/family education, and DME and/or AE instructions  RECOMMENDED OTHER SERVICES: PT  CONSULTED AND AGREED WITH PLAN OF CARE: Patient and friend  PLAN FOR NEXT SESSION: Treatment, completion of MAM-20-If Pt. Is agreeable   Richardson Otter, MS, OTR/L   06/28/2024, 9:35 PM

## 2024-06-30 ENCOUNTER — Ambulatory Visit: Attending: Cardiology

## 2024-06-30 DIAGNOSIS — I639 Cerebral infarction, unspecified: Secondary | ICD-10-CM | POA: Diagnosis not present

## 2024-07-02 LAB — CUP PACEART REMOTE DEVICE CHECK
Date Time Interrogation Session: 20251212230539
Implantable Pulse Generator Implant Date: 20220405

## 2024-07-05 ENCOUNTER — Ambulatory Visit: Admitting: Physical Therapy

## 2024-07-05 ENCOUNTER — Ambulatory Visit: Admitting: Occupational Therapy

## 2024-07-05 DIAGNOSIS — R278 Other lack of coordination: Secondary | ICD-10-CM

## 2024-07-05 DIAGNOSIS — M6281 Muscle weakness (generalized): Secondary | ICD-10-CM

## 2024-07-05 DIAGNOSIS — R2689 Other abnormalities of gait and mobility: Secondary | ICD-10-CM

## 2024-07-05 DIAGNOSIS — R2681 Unsteadiness on feet: Secondary | ICD-10-CM

## 2024-07-05 DIAGNOSIS — I639 Cerebral infarction, unspecified: Secondary | ICD-10-CM | POA: Diagnosis not present

## 2024-07-05 NOTE — Therapy (Cosign Needed)
 OUTPATIENT PHYSICAL THERAPY NEURO TREATMENT   Patient Name: [name] MRN: [id number] DOB:1960-07-29, 63 y.o., male Today's Date: 07/05/2024   PCP: Aletha Bene, MD   REFERRING PROVIDER: Rosemarie Eather RAMAN, MD   END OF SESSION:  PT End of Session - 07/05/24 1136     Visit Number 2    Number of Visits 25    Date for Recertification  09/20/24    PT Start Time 1145    PT Stop Time 1225    PT Time Calculation (min) 40 min    Equipment Utilized During Treatment Gait belt    Activity Tolerance Patient tolerated treatment well    Behavior During Therapy [facility] for tasks assessed/performed          Past Medical History:  Diagnosis Date   Anemia 10/03/2009   Qualifier: Diagnosis of  By: Georgian ROSALEA CHARM Lamar    Anxiety associated with depression 10/11/2013   pt states he is no longer having this issue because he is happy with his job now   Colon cancer [facility])    right colon cancer- adenocarcinoma CEA level isnrmal at 1.8   Coronary artery disease    Diabetes mellitus    Diabetes mellitus type 2 in obese 03/20/2010   Qualifier: Diagnosis of  By: Georgian ROSALEA CHARM Lamar     Erectile dysfunction 06/22/2016   Essential hypertension 06/05/2014   FATTY LIVER DISEASE 11/04/2009   Qualifier: Diagnosis of  By: Abran MD, Norleen SAILOR   Qualifier: Diagnosis of  By: Abran MD, Norleen SAILOR  Last Assessment & Plan:  conirmed by CT scan of abdomen in April 2016 encouraged to minimize simple carbs and fatty foods.   GERD 11/04/2009   Qualifier: Diagnosis of  By: Abran MD, Norleen SAILOR    Great toe pain, right 04/18/2017   Hepatic artery stenosis    History of colon cancer 01/02/2010   Qualifier: Diagnosis of  By: Georgian ROSALEA CHARM Lamar Dr Abran  Last Assessment & Plan:  Follows closely with gastroenterology, Dr Abran   Hyperlipidemia    Hyperlipidemia, mixed 09/07/2010   Qualifier: Diagnosis of  By: Georgian ROSALEA CHARM Lamar    Hypertension    Hypertriglyceridemia 12/14/2010   Hypotestosteronism 06/22/2016   INSOMNIA,  CHRONIC 03/20/2010   Qualifier: Diagnosis of  By: Georgian ROSALEA CHARM Lamar Ambien  10 mg daily does not keep asleep AdvilPM is over sedating and has trouble waking up    Iron  deficiency anemia 2011   Low testosterone  06/22/2016   Malignant neoplasm of colon (HCC) 01/02/2010   Qualifier: Diagnosis of  By: Georgian ROSALEA CHARM Lamar Dr Abran    Muscle spasm of back 06/05/2014   Nipple pain 09/30/2016   Obesity    Palpitations 06/05/2014   Preventative health care 10/08/2012   Psoriasis    Psoriatic arthritis (HCC) 08/25/2017   Right knee pain 11/17/2011   Stroke [facility])    Past Surgical History:  Procedure Laterality Date   BUBBLE STUDY  10/21/2020   Procedure: BUBBLE STUDY;  Surgeon: Mona Vinie BROCKS, MD;  Location: [facility] ENDOSCOPY;  Service: Cardiovascular;;   CARPAL TUNNEL RELEASE     RIGHT   CHOLECYSTECTOMY  1994   COLONOSCOPY     CORONARY ARTERY BYPASS GRAFT N/A 09/19/2023   Procedure: CORONARY ARTERY BYPASS GRAFTING (CABG) TIMES TWO USING LEFT INTERNAL MAMMARY ARTERY AND ENDOSCOPICALLY HARVESTED RIGHT GREATER SAPHENOUS VEIN;  Surgeon: Lucas Dorise POUR, MD;  Location: [facility] OR;  Service: Open Heart Surgery;  Laterality: N/A;  CORONARY/GRAFT ACUTE MI REVASCULARIZATION N/A 10/27/2019   Procedure: Coronary/Graft Acute MI Revascularization;  Surgeon: Darron Deatrice LABOR, MD;  Location: [facility] INVASIVE CV LAB;  Service: Cardiovascular;  Laterality: N/A;   HEMICOLECTOMY     12/17/2009 right   LEFT HEART CATH AND CORONARY ANGIOGRAPHY N/A 10/27/2019   Procedure: LEFT HEART CATH AND CORONARY ANGIOGRAPHY;  Surgeon: Darron Deatrice LABOR, MD;  Location: [facility] INVASIVE CV LAB;  Service: Cardiovascular;  Laterality: N/A;   LOOP RECORDER INSERTION N/A 10/21/2020   Procedure: LOOP RECORDER INSERTION;  Surgeon: Kelsie Agent, MD;  Location: [facility] INVASIVE CV LAB;  Service: Cardiovascular;  Laterality: N/A;   POLYPECTOMY     RIGHT/LEFT HEART CATH AND CORONARY ANGIOGRAPHY Bilateral 08/12/2023   Procedure: RIGHT/LEFT HEART CATH AND CORONARY  ANGIOGRAPHY;  Surgeon: Darron Deatrice LABOR, MD;  Location: [facility] INVASIVE CV LAB;  Service: Cardiovascular;  Laterality: Bilateral;   TEE WITHOUT CARDIOVERSION N/A 10/21/2020   Procedure: TRANSESOPHAGEAL ECHOCARDIOGRAM (TEE);  Surgeon: Mona Vinie BROCKS, MD;  Location: [facility] ENDOSCOPY;  Service: Cardiovascular;  Laterality: N/A;   TEE WITHOUT CARDIOVERSION N/A 09/19/2023   Procedure: TRANSESOPHAGEAL ECHOCARDIOGRAM (TEE);  Surgeon: Lucas Dorise POUR, MD;  Location: [facility] OR;  Service: Open Heart Surgery;  Laterality: N/A;   Patient Active Problem List   Diagnosis Date Noted   CVA (cerebral vascular accident) (HCC) 06/21/2024   S/P CABG x 2 09/22/2023   Coronary artery disease 09/19/2023   Annual physical exam 05/20/2023   Screen for colon cancer 05/20/2023   History of vitamin D  deficiency 05/20/2023   Acute reaction to situational stress 05/20/2023   Psychophysiological insomnia 03/10/2022   Hypertension associated with diabetes (HCC) 11/25/2021   Hyperlipidemia associated with type 2 diabetes mellitus (HCC) 11/25/2021   Diabetes mellitus (HCC) 11/25/2021   History of stroke 10/21/2020   Chronic systolic congestive heart failure (HCC) 11/30/2019   Coronary artery disease involving native coronary artery 11/30/2019   History of ST elevation myocardial infarction (STEMI) 10/27/2019   Psoriatic arthritis (HCC) 08/25/2017   History of colon cancer 01/02/2010   Esophageal reflux 11/04/2009    ONSET DATE: 06/21/2024  REFERRING DIAG:  I63.9 (ICD-10-CM) - Cryptogenic stroke (HCC)  R29.818 (ICD-10-CM) - Hemisensory deficit    THERAPY DIAG:  Muscle weakness (generalized)  Other lack of coordination  Cerebrovascular accident (CVA), unspecified mechanism (HCC)  Muscle weakness of lower extremity  Other abnormalities of gait and mobility  Unsteadiness on feet  Rationale for Evaluation and Treatment: Rehabilitation  SUBJECTIVE:  Pt presents to PT after OT appt requesting to be guarded from  behind, due to difficulty trusting people.  SUBJECTIVE STATEMENT: Patient began to have symptoms of stroke the night of 06/20/2024, but did not go to ER until 06/21/2024. He was noted to have L sided weakness along with decreased sensation. Pt. Did not receive tpa at hospital because it was too late. Patient reports he was in hospital for one day then released. He continues to have numbness and weakness on L side. Pt initially had a stroke effecting R side about 2-3 years ago that he was still dealing with including decreased balance and right sided weakness. He was using cane prior to this most recent stroke for ambulation, but is now using 3 wheeled walker. Patient reports he gets fatigued very easily with simple tasks. He also reports intermittent tremor in L leg.    Pt accompanied by: friend.   PERTINENT HISTORY: Patient experienced a right sided stroke on 06/20/2024 resulting in L hemiparesis and loss of sensation.  PAIN:  Are you having pain? No  PRECAUTIONS: Fall  RED FLAGS: None   WEIGHT BEARING RESTRICTIONS: No  FALLS: Has patient fallen in last 6 months? Yes. Number of falls 5x on 06/21/2024.   LIVING ENVIRONMENT: Lives with: lives with their family and lives alone Lives in: House/apartment Stairs: Yes: External: 2 steps; none Has following equipment at home: Single point cane, Environmental Consultant - 2 wheeled, Wheelchair (manual), shower chair, and Grab bars  PLOF: Independent  PATIENT GOALS: Improve LE strength, gait, balance, independence with mobility and functional tasks at home, and yard work.   OBJECTIVE:  Note: Objective measures were completed at Evaluation unless otherwise noted.  DIAGNOSTIC FINDINGS: IMPRESSION: 1. Multiple small acute ischemic infarcts within the right hemisphere  affecting parts of the right parietal cortex, right temporal cortex, and the right occipital lobe, with punctate foci of acute ischemia in the right frontal white matter. 2. Multifocal hyperintense T2-weighted signal within the cerebral white matter, most commonly due to chronic small vessel disease.    COGNITION: Overall cognitive status: Within functional limits for tasks assessed   SENSATION:L lower extremity.  Light touch: Impaired  Proprioception: Impaired   COORDINATION: Decreased speed and accuracy with toe tap.    MUSCLE TONE: LLE: Within functional limits   POSTURE: rounded shoulders  LOWER EXTREMITY ROM:     Active  Right Eval Left Eval  Hip flexion    Hip extension    Hip abduction    Hip adduction    Hip internal rotation    Hip external rotation    Knee flexion    Knee extension    Ankle dorsiflexion    Ankle plantarflexion    Ankle inversion    Ankle eversion     (Blank rows = not tested)  LOWER EXTREMITY MMT:    MMT Right Eval Left Eval  Hip flexion 4 4  Hip extension    Hip abduction 5 4-  Hip adduction    Hip internal rotation    Hip external rotation    Knee flexion 5 5  Knee extension 5 5  Ankle dorsiflexion 5 5  Ankle plantarflexion    Ankle inversion    Ankle eversion    (Blank rows = not tested)  BED MOBILITY:  Independent  TRANSFERS: Independent  RAMP:  Not tested  CURB:  Not tested  STAIRS: Not tested GAIT: Findings: Gait Characteristics: ataxic, Distance walked: 100', and Comments: Very slight ataxia with gait.  FUNCTIONAL TESTS:  5 times sit to stand: 23.47 Timed up and go (TUG): 13.4 without AD. 10 MWT:0.77 m/s without  AD. 6 minute walk test: Perform at initial treatment. Berg Balance Scale:  Item Test date: 06/28/2024 Date:  Date:   Sitting to standing 4. able to stand without using hands and stabilize independently Insert SmartPhrase OPRCBERGREEVAL Insert SmartPhrase OPRCBERGREEVAL  2. Standing  unsupported 4. able to stand safely for 2 minutes    3. Sitting with back unsupported, feet supported 4. able to sit safely and securely for 2 minutes    4. Standing to sitting 3. controls descent by using hands    5. Pivot transfer  4. able to transfer safely with minor use of hands    6. Standing unsupported with eyes closed 4. able to stand 10 seconds safely    7. Standing unsupported with feet together 4. able to place feet together independently and stand 1 minute safely    8. Reaching forward with outstretched arms while standing 4. can reach forward confidently 25 cm (10 inches)    9. Pick up object from the floor from standing 3. able to pick up slipper but needs supervision    10. Turning to look behind over left and right shoulders while standing 3. looks behind one side only, other side shows less weight shift    11. Turn 360 degrees 3. able to turn 360 degrees safely, one side only, in 4 seconds or less    12. Place alternate foot on step or stool while standing unsupported 4. ble to stand independently and safely and complete 8 steps in 20 seconds    13. Standing unsupported one foot in front 3. able to place foot ahead independently and hold 30 seconds    14. Standing on one leg 4. able to lift leg independently and hold > 10 seconds     Total Score 51/56 Total Score:    Total Score:      PATIENT SURVEYS:  ABC scale: The Activities-Specific Balance Confidence (ABC) Scale 0% 10 20 30  40 50 60 70 80 90 100% No confidence<->completely confident  How confident are you that you will not lose your balance or become unsteady when you . . .   Date tested 06/28/2024  Walk around the house 50%  2. Walk up or down stairs 0%  3. Bend over and pick up a slipper from in front of a closet floor 30%  4. Reach for a small can off a shelf at eye level 30%  5. Stand on tip toes and reach for something above your head 30%  6. Stand on a chair and reach for something 0%  7. Sweep the floor 0%   8. Walk outside the house to a car parked in the driveway 40%  9. Get into or out of a car 50%  10. Walk across a parking lot to the mall 40%  11. Walk up or down a ramp 20%  12. Walk in a crowded mall where people rapidly walk past you 20%  13. Are bumped into by people as you walk through the mall 0%  14. Step onto or off of an escalator while you are holding onto the railing 50%  15. Step onto or off an escalator while holding onto parcels such that you cannot hold onto the railing 0%  16. Walk outside on icy sidewalks 0%  Total: #/16 22.5%  TREATMENT DATE: 07/05/2024   Physical Performance Test or Measurement: a  physical performance test(s) or measurement (eg,  musculoskeletal, functional capacity), with written report,  each 15 mins    6 Min Walk Test:  Instructed patient to ambulate as quickly and as safely as possible for 6 minutes using LRAD. Patient was allowed to take standing rest breaks without stopping the test, but if the patient required a sitting rest break the clock would be stopped and the test would be over.  Results: 800 feet using a 3WW with SBA. Results indicate that the patient has reduced endurance with ambulation compared to age matched norms.  Age Matched Norms (in meters): 77-69 yo M: 71 F: 5, 31-79 yo M: 39 F: 471, 31-89 yo M: 417 F: 392 MDC: 58.21 meters (190.98 feet) or 50 meters (ANPTA Core Set of Outcome Measures for Adults with Neurologic Conditions, 2018)  -Pt completed with frequent pauses, alternating between propelling walker with right hand or left hand only, and ambulating without  TA- To improve functional movements patterns for everyday tasks    Sit to stand with elevated right LE 2 x 10 -Mild lateral LOB and occasional knee push off from chair   Side stepping at bar with 3# AW 2 x 10 laps (2 steps right,  2 steps left= 1 lap)  -Second set progressed to 4# AW  NMR: To facilitate reeducation of movement, balance, posture, coordination, and/or proprioception/kinesthetic sense.  Front foot elevated on stair step, back foot on airex 1 x 30 sec -Progressed above to rotational ball pass with SPT handoff x 10 ea direction  Fwd step up onto airex 2 x 10 ea LE, no UE support  -VC to shift weight anteriorly  Lateral step up and over airex 2 x 10 ea LE, no UE support  Unless otherwise stated, CGA was provided and gait belt donned in order to ensure pt safety   PATIENT EDUCATION: Education details: HEP, safety in home, stroke education. Person educated: Patient and patient's friend Education method: Explanation, Demonstration, Verbal cues, and Handouts Education comprehension: verbalized understanding and returned demonstration  HOME EXERCISE PROGRAM: Access Code: [email or web address] URL: [email or web address] Date: 06/28/2024 Prepared by: Norman Sharps  Exercises - Sit to Stand with Arms Crossed  - 1 x daily - 7 x weekly - 2 sets - 10 reps - Standing Tandem Balance with Counter Support  - 1 x daily - 7 x weekly - 1 sets - 3 reps - 30 second hold - Single Leg Stance  - 1 x daily - 7 x weekly - 1 sets - 3 reps - 20 second hold  GOALS: Goals reviewed with patient? Yes  SHORT TERM GOALS: Target date: 07/12/2024  09/21/2023 Baseline: Patient will be independent with HEP by discharge allowing him to perform exercises at home with decreased difficulty and improved safety.  Goal status: INITIAL    LONG TERM GOALS: Target date: 09/20/2024  1.  Patient (> 100 years old) will complete five times sit to stand test in < 15 seconds indicating an increased LE strength and improved balance. Baseline: 23.47 without AD. Goal status: INITIAL  2.  Patient will increase ABC Scale by 40% by discharge allowing him to ambulate and perform functional activities with decreased risk of falling.  Baseline:  22.5%. Goal status: INITIAL   3.  Patient will reduce timed up and go to <11 seconds to reduce fall risk and demonstrate improved transfer/gait ability. Baseline: 13.4'' without Ad. Goal status: INITIAL  4.  Patient will increase 10 meter walk test to >1.61m/s as to improve gait speed for better community ambulation and to reduce fall risk. Baseline: 0.77 meters/second Goal status: INITIAL  5.  Patient will increase six minute walk test distance to >1000 for progression to community ambulator and improve gait ability Baseline: 800 ft Goal status: INITIAL    ASSESSMENT:  CLINICAL IMPRESSION: Pt presented to PT today with the request to guard from behind, due to limited trust in others. Session began with continued evaluation through . Test results do not accurately portray pts walking endurance due to pausing and demonstrating different gait options with 3WW. Despite accurate description of test prior to completing, SPT had to make several attempts to limit pauses and prevent showing different gait styles with 3WW, but pt proved to be very verbose. Also, due to pts adamant request, guarding was completed in a SBA with SPT directly behind pt. The rest of the activities  worked on sit to stands, side stepping, and dynamic balance. Pt experienced one minor lateral LOB when first attempting sit to stand with right foot elevated. Side steps were progressed from 3# AW to 4#, with good lateral step length. VC to keep left toes facing forward helped facilitate improved form. Pt was most challenged this session with fwd steps onto airex due to tendency to lean posteriorly. VC to shift weight anteriorly help decrease difficulty and promote improved form. Pt asks questions throughout about form and demonstrates great willingness to complete activities accurately. Pt will continue to benefit from skilled physical therapy intervention to address impairments, improve QOL, and attain therapy goals.     OBJECTIVE IMPAIRMENTS: Abnormal gait, decreased activity tolerance, decreased balance, decreased coordination, decreased endurance, difficulty walking, decreased strength, impaired sensation, and impaired UE functional use.   ACTIVITY LIMITATIONS: carrying, lifting, bending, standing, squatting, stairs, transfers, and bed mobility  PARTICIPATION LIMITATIONS: meal prep, cleaning, laundry, driving, shopping, community activity, and yard work  PERSONAL FACTORS: 1-2 comorbidities: HTN, CHF, CAD, CVA are also affecting patient's functional outcome.   REHAB POTENTIAL: Good  CLINICAL DECISION MAKING: Stable/uncomplicated  EVALUATION COMPLEXITY: Moderate  PLAN:  PT FREQUENCY: 1-2x/week  PT DURATION: 12 weeks  PLANNED INTERVENTIONS: [id number]- PT Re-evaluation, 97750- Physical Performance Testing, 97110-Therapeutic exercises, 97530- Therapeutic activity, 97112- Neuromuscular re-education, 97535- Self Care, [id number]- Manual therapy, [phone]- Gait training, Patient/Family education, Balance training, Stair training, and Vestibular training  PLAN FOR NEXT SESSION:  -Continue working on confidence with gait -Continue working to improve dynamic balance and LE coordination.    Leonor Rode, Student-SPT 07/05/2024, 11:37 AM  This entire session was performed under direct supervision and direction of a licensed therapist/therapist assistant . I have personally read, edited and approve of the note as written.    This licensed clinician was present and actively directing care throughout the session at all times.  Lonni KATHEE Gainer PT ,DPT Physical Therapist- [place]  [facility]

## 2024-07-05 NOTE — Therapy (Addendum)
 " OUTPATIENT OCCUPATIONAL THERAPY NEURO TREATMENT NOTE  Patient Name: [name] MRN: [id number] DOB:1961-01-20, 63 y.o., male Today's Date: 07/05/2024  PCP: Aletha Bene, MD REFERRING PROVIDER: Rosemarie Eather RAMAN, MD  END OF SESSION:  OT End of Session - 07/05/24 1839     Visit Number 2    Number of Visits 12    Date for Recertification  09/20/24    OT Start Time 1105    OT Stop Time 1145    OT Time Calculation (min) 40 min    Activity Tolerance Patient tolerated treatment well    Behavior During Therapy [facility] for tasks assessed/performed          Past Medical History:  Diagnosis Date   Anemia 10/03/2009   Qualifier: Diagnosis of  By: Georgian ROSALEA CHARM Lamar    Anxiety associated with depression 10/11/2013   pt states he is no longer having this issue because he is happy with his job now   Colon cancer [facility])    right colon cancer- adenocarcinoma CEA level isnrmal at 1.8   Coronary artery disease    Diabetes mellitus    Diabetes mellitus type 2 in obese 03/20/2010   Qualifier: Diagnosis of  By: Georgian ROSALEA CHARM Lamar     Erectile dysfunction 06/22/2016   Essential hypertension 06/05/2014   FATTY LIVER DISEASE 11/04/2009   Qualifier: Diagnosis of  By: Abran MD, Norleen SAILOR   Qualifier: Diagnosis of  By: Abran MD, Norleen SAILOR  Last Assessment & Plan:  conirmed by CT scan of abdomen in April 2016 encouraged to minimize simple carbs and fatty foods.   GERD 11/04/2009   Qualifier: Diagnosis of  By: Abran MD, Norleen SAILOR    Great toe pain, right 04/18/2017   Hepatic artery stenosis    History of colon cancer 01/02/2010   Qualifier: Diagnosis of  By: Georgian ROSALEA CHARM Lamar Dr Abran  Last Assessment & Plan:  Follows closely with gastroenterology, Dr Abran   Hyperlipidemia    Hyperlipidemia, mixed 09/07/2010   Qualifier: Diagnosis of  By: Georgian ROSALEA CHARM Lamar    Hypertension    Hypertriglyceridemia 12/14/2010   Hypotestosteronism 06/22/2016   INSOMNIA, CHRONIC 03/20/2010   Qualifier: Diagnosis of  By:  Georgian ROSALEA CHARM Lamar Ambien  10 mg daily does not keep asleep AdvilPM is over sedating and has trouble waking up    Iron  deficiency anemia 2011   Low testosterone  06/22/2016   Malignant neoplasm of colon (HCC) 01/02/2010   Qualifier: Diagnosis of  By: Georgian ROSALEA CHARM Lamar Dr Abran    Muscle spasm of back 06/05/2014   Nipple pain 09/30/2016   Obesity    Palpitations 06/05/2014   Preventative health care 10/08/2012   Psoriasis    Psoriatic arthritis (HCC) 08/25/2017   Right knee pain 11/17/2011   Stroke [facility])    Past Surgical History:  Procedure Laterality Date   BUBBLE STUDY  10/21/2020   Procedure: BUBBLE STUDY;  Surgeon: Mona Vinie BROCKS, MD;  Location: [facility] ENDOSCOPY;  Service: Cardiovascular;;   CARPAL TUNNEL RELEASE     RIGHT   CHOLECYSTECTOMY  1994   COLONOSCOPY     CORONARY ARTERY BYPASS GRAFT N/A 09/19/2023   Procedure: CORONARY ARTERY BYPASS GRAFTING (CABG) TIMES TWO USING LEFT INTERNAL MAMMARY ARTERY AND ENDOSCOPICALLY HARVESTED RIGHT GREATER SAPHENOUS VEIN;  Surgeon: Lucas Dorise POUR, MD;  Location: [facility] OR;  Service: Open Heart Surgery;  Laterality: N/A;   CORONARY/GRAFT ACUTE MI REVASCULARIZATION N/A 10/27/2019   Procedure: Coronary/Graft Acute MI  Revascularization;  Surgeon: Darron Deatrice LABOR, MD;  Location: [facility] INVASIVE CV LAB;  Service: Cardiovascular;  Laterality: N/A;   HEMICOLECTOMY     12/17/2009 right   LEFT HEART CATH AND CORONARY ANGIOGRAPHY N/A 10/27/2019   Procedure: LEFT HEART CATH AND CORONARY ANGIOGRAPHY;  Surgeon: Darron Deatrice LABOR, MD;  Location: [facility] INVASIVE CV LAB;  Service: Cardiovascular;  Laterality: N/A;   LOOP RECORDER INSERTION N/A 10/21/2020   Procedure: LOOP RECORDER INSERTION;  Surgeon: Kelsie Agent, MD;  Location: [facility] INVASIVE CV LAB;  Service: Cardiovascular;  Laterality: N/A;   POLYPECTOMY     RIGHT/LEFT HEART CATH AND CORONARY ANGIOGRAPHY Bilateral 08/12/2023   Procedure: RIGHT/LEFT HEART CATH AND CORONARY ANGIOGRAPHY;  Surgeon: Darron Deatrice LABOR, MD;   Location: [facility] INVASIVE CV LAB;  Service: Cardiovascular;  Laterality: Bilateral;   TEE WITHOUT CARDIOVERSION N/A 10/21/2020   Procedure: TRANSESOPHAGEAL ECHOCARDIOGRAM (TEE);  Surgeon: Mona Vinie BROCKS, MD;  Location: [facility] ENDOSCOPY;  Service: Cardiovascular;  Laterality: N/A;   TEE WITHOUT CARDIOVERSION N/A 09/19/2023   Procedure: TRANSESOPHAGEAL ECHOCARDIOGRAM (TEE);  Surgeon: Lucas Dorise POUR, MD;  Location[facility] OR;  Service: Open Heart Surgery;  Laterality: N/A;   Patient Active Problem List   Diagnosis Date Noted   CVA (cerebral vascular accident) (HCC) 06/21/2024   S/P CABG x 2 09/22/2023   Coronary artery disease 09/19/2023   Annual physical exam 05/20/2023   Screen for colon cancer 05/20/2023   History of vitamin D  deficiency 05/20/2023   Acute reaction to situational stress 05/20/2023   Psychophysiological insomnia 03/10/2022   Hypertension associated with diabetes (HCC) 11/25/2021   Hyperlipidemia associated with type 2 diabetes mellitus (HCC) 11/25/2021   Diabetes mellitus (HCC) 11/25/2021   History of stroke 10/21/2020   Chronic systolic congestive heart failure (HCC) 11/30/2019   Coronary artery disease involving native coronary artery 11/30/2019   History of ST elevation myocardial infarction (STEMI) 10/27/2019   Psoriatic arthritis (HCC) 08/25/2017   History of colon cancer 01/02/2010   Esophageal reflux 11/04/2009    ONSET DATE: 06/21/24  REFERRING DIAG: CVA with left Hemiparesis, and sensory loss  THERAPY DIAG:  Muscle weakness (generalized)  Rationale for Evaluation and Treatment: Rehabilitation  SUBJECTIVE:   SUBJECTIVE STATEMENT: Pt.  reports that he is still upset that he was referred to OT because he was told they would fix the sensory impairment in his left hand. Pt accompanied by: self and friend  PERTINENT HISTORY: Pt. is a 63 y.o male who was admitted to the hospital from 12/4-12/11/2023 with a CVA with left hemiparesis, and sensory loss. Imaging revealed  an acute right parietal, temporal, and occipital Ischemic CVA. PMHx includes: HTN associated with DM, Hyperlipidemia associated with DM, CAD, s/p CABG, Hx of CVA.  PRECAUTIONS: None  WEIGHT BEARING RESTRICTIONS: No  PAIN Are you having pain? No  FALLS: Has patient fallen in last 6 months? Yes, repots 4-5 falls recently  LIVING ENVIRONMENT: Lives with: Alone Stairs: 1-2 stes to enter Has following equipment at home: Rollator walker with 3 wheels  PLOF: Reports having had a previous CVA  PATIENT GOALS: To have the sensation in the LUE completely resolve  OBJECTIVE:  Note: Objective measures were completed at Evaluation unless otherwise noted.  HAND DOMINANCE: Left hand dominant.  ADLs: Overall ADLs: Pt. reports that his LUE is numb, therefore he is completely unable to use it during any ADL/ADL tasks. Pt. reports that he is using the RUE for all ADL tasks. Pt. reports having to use his RUE to support his  LUE when attempting to pick up, or hold a drink. Due to agitation, Pt. Did not provide additional information about ADL functioning.   Transfers/ambulation related to ADLs: Eating:  Grooming:  UB Dressing:  LB Dressing:  Toileting:  Bathing:  Tub Shower transfers:  IADLs: Shopping:  Light housekeeping:  Meal Prep:  Community mobility:  Medication management:  Financial management:  Handwriting:   MOBILITY STATUS: Pt. Arrived to the clinic with a 3 wheeled rollator walker.     FUNCTIONAL OUTCOME MEASURES: Eval: MAM-20: Not assessed 2/2 agitation  UPPER EXTREMITY ROM:    Active ROM Right Eval WFL Left eval  Shoulder flexion  132  Shoulder abduction  136  Shoulder adduction    Shoulder extension    Shoulder internal rotation    Shoulder external rotation    Elbow flexion  WFL  Elbow extension  4  Wrist flexion    Wrist extension  70  Wrist ulnar deviation  32  Wrist radial deviation  18  Wrist pronation    Wrist supination    (Blank rows = not  tested)  UPPER EXTREMITY MMT:     MMT Right Eval WFL  Overall Left Eval 4-/5 overall  Shoulder flexion    Shoulder abduction    Shoulder adduction    Shoulder extension    Shoulder internal rotation    Shoulder external rotation    Middle trapezius    Lower trapezius    Elbow flexion    Elbow extension    Wrist flexion    Wrist extension    Wrist ulnar deviation    Wrist radial deviation    Wrist pronation    Wrist supination    (Blank rows = not tested)  HAND FUNCTION: Grip strength: Right: 90 lbs; Left: 38 lbs, Lateral pinch: Right: 15 lbs, Left: 12 lbs, and 3 point pinch: Right: 13 lbs, Left: 10 lbs  COORDINATION: 9 Hole Peg test: Right: 36 sec; Left: Pt. was able to formulate a 2pt. grasp pattern with the left hand in anticipation for grasping the 9 hole pegs from a horizontal position, however was unable to secure a grasp on any of the pegs.  SENSATION: LUE Light touch: Impaired  Proprioception: Impaired   EDEMA: N/A  MUSCLE TONE: WFL  COGNITION: Overall cognitive status:  Difficult to assess 2/2 agitation, and perseveration on sensory loss and timeline/prognosis sensroy return following the CVA.  VISION: N/T 2/2 agitation  PRAXIS: Impaired: Motor planning                                                                                 TREATMENT DATE: 07/05/24   Therapeutic Ex.:   -The patient completed 5 minutes at level(s) 4.0 on the SciFit using bilateral upper extremity forward reciprocal movements  to promote upper extremity strength, endurance, and ROM, as well as to improve cardiorespiratory fitness. The therapist increased the resistance level and continuously monitored the patient's response to the intervention. The patient required min/ cueing for technique.   Therapeutic Activities:   -Left Progressive gross grip strengthening with 28.9# of grip strength resistive force 1/2 way through, tension was adjusted to 23.4# in combination with LUE  reaching component  through multiple planes during the task to further challenge sustained grip while moving the proximal UE towards a goal directed target(container) -Facilitated reps of anticipating placement of the left hand onto the gripper with vision occluded with 90% accuracy and minimal assist. -Facilitated Lateral, and 3pt. Pinch strengthening using yellow, red, green, and blue, level resistive clips. -Attempted to Facilitate translatory movements moving clips from the lateral pinch position to the 3pt. Pinch position in preparation for securely placing them on the dowel to promote hand function skills. --Pt. Worked on modulating the amount of grip, and pinch resistance force.  PATIENT EDUCATION: Education details: Ecologist, proprioception/weightbearing, safety with wlaking. Person educated: Patient, Caregiver: Friend Education method: Explanation, Demonstration, Verbal cues, and Handouts Education comprehension: verbalized understanding, returned demonstration, verbal cues required, tactile cues required, and needs further education  HOME EXERCISE PROGRAM:   -Blue theraputty hand strengthening exercises with a HEP through [employer]  GOALS: Goals reviewed with patient? Yes  SHORT TERM GOALS: Target date: 08/09/2024    Pt. Will be independent with HEPs for LUE strength, and [facility]  Baseline: Eval: Pt. education was provided about a HEP for the LUE. Goal status: INITIAL  LONG TERM GOALS: Target date: 09/20/2024  Pt. Will improve Left hand grip strength by 5# to be able to be able to securely hold items in his left hand Baseline: Eval: Right: 90 lbs, Left: 38# Goal status: INITIAL  2.  Pt. Will improve left hand pinch strength to be able to daily care items with lids. Baseline: Eval: Lteral pinch strength: Right: 15#, Left: 12# 3pt. Pinch strength: Right: 13#, Left: 10# Goal status: INITIAL  3.  Pt. Will improve left hand [facility] skills to be able to grasp, manipulate  and place 2 pegs on the 9 hole peg test Baseline: Eval: Right: 36 sec  Left:  Pt. was able to formulate a 2pt. grasp pattern with the left hand in anticipation for grasping the 9 hole pegs from a horizontal position, however was unable to secure a grasp on any of the pegs. Goal status: INITIAL  4.  Pt. Will LUE strength to assist with ADLs, and IADLs Baseline: Eval: Right: WFL, Left: 4-/5 Goal status: INITIAL  5.  Pt. Will identify 3 compensatory strategies for decreased sensation during ADLs, and IADL tasks. Baseline: Eval: Education to be provided Goal status: INITIAL   ASSESSMENT:  CLINICAL IMPRESSION:  Pt. drove to therapy today, and arrived alone using his 3 wheeled walker. While walking back from the reception area to the gym, when pressing the button to activate the swinging double doors for the Pt, he became very agitated that the therapist was walking to close to him. The Pt. stopped in the middle of the double doors, and reported that he was going to stand there until the therapist walked way out in front of him. This therapist stayed close by to keep the door activated in the open position to ensure the patient  had safe passage through the doors without the doors closing on him. This  therapist attempted to educate the Pt. about the timing, and safety of the swinging doors. This therapist then attempted to provide the space the patient requested once through the doors,  as he walked down the hall to therapy. Pt. Reports that he gets stressed when walking, because he can't see where people are when walking near him.  Pt. Reports having had a hard morning requiring increased time to shower, reports that the cut himself trying to use his  nondominant hand then his left hand for shaving this morning. Pt. Reports that he also caught every light on the way here. Pt. Reports that he is only doing OT because his friend wants him to.  Pt. Reports that he found some exercises online that he prefers  to do instead of the hand strengthening exercises with the blue theraputty provided to him at the last session. Pt. Tolerated 5 min. On the SciFIt. Pt. Was able to complete half the grip strength board with 28.9# of resistance, then transitioned to 23.4#. Pt. Was able to complete reps of placing the left hand onto the gripper with his vision occluded with assist for support. Pt. Was able to sustain grip while reaching up to place them at a target. Pt. Was able to tolerate resistive clips to level green. Pt. Has difficulty modulating the accurate amount of pressure on the clips, and has difficulty with translatory movements with the clips 2/2 decreased sensation in the left hand.  Pt. Continues to benefit from OT services to work on improving dominant LUE functioning in order to increase engagement during ADLs, and IADL tasks, as well as to provide education about compensatory strategies for LUE sensory loss during ADLs, and IADL tasks.  PERFORMANCE DEFICITS: in functional skills including ADLs, IADLs, coordination, dexterity, proprioception, sensation, strength, Fine motor control, Gross motor control, decreased knowledge of use of DME, and UE functional use, and psychosocial skills including coping strategies, environmental adaptation, interpersonal interactions, and routines and behaviors.   IMPAIRMENTS: are limiting patient from ADLs, IADLs, and leisure.   CO-MORBIDITIES: may have co-morbidities  that affects occupational performance. Patient will benefit from skilled OT to address above impairments and improve overall function.  MODIFICATION OR ASSISTANCE TO COMPLETE EVALUATION: Min-Moderate modification of tasks or assist with assess necessary to complete an evaluation.  OT OCCUPATIONAL PROFILE AND HISTORY: Detailed assessment: Review of records and additional review of physical, cognitive, psychosocial history related to current functional performance.  CLINICAL DECISION MAKING: Moderate - several  treatment options, min-mod task modification necessary  REHAB POTENTIAL: Good  EVALUATION COMPLEXITY: Moderate    PLAN:  OT FREQUENCY: 1 OT DURATION: 12 weeks  PLANNED INTERVENTIONS: 97168 OT Re-evaluation, 97535 self care/ADL training, 02889 therapeutic exercise, 97530 therapeutic activity, 97112 neuromuscular re-education, 97140 manual therapy, functional mobility training, energy conservation, patient/family education, and DME and/or AE instructions  RECOMMENDED OTHER SERVICES: PT  CONSULTED AND AGREED WITH PLAN OF CARE: Patient and friend  PLAN FOR NEXT SESSION: Treatment, completion of MAM-20-If Pt. Is agreeable   Richardson Otter, MS, OTR/L  07/05/2024, 6:44 PM           "

## 2024-07-06 ENCOUNTER — Ambulatory Visit: Payer: Self-pay | Admitting: Cardiology

## 2024-07-06 NOTE — Progress Notes (Signed)
 Remote Loop Recorder Transmission

## 2024-07-10 ENCOUNTER — Ambulatory Visit

## 2024-07-10 DIAGNOSIS — I639 Cerebral infarction, unspecified: Secondary | ICD-10-CM | POA: Diagnosis not present

## 2024-07-10 DIAGNOSIS — R2681 Unsteadiness on feet: Secondary | ICD-10-CM

## 2024-07-10 DIAGNOSIS — M6281 Muscle weakness (generalized): Secondary | ICD-10-CM

## 2024-07-10 DIAGNOSIS — R278 Other lack of coordination: Secondary | ICD-10-CM

## 2024-07-10 DIAGNOSIS — R2689 Other abnormalities of gait and mobility: Secondary | ICD-10-CM

## 2024-07-10 NOTE — Therapy (Signed)
 " OUTPATIENT PHYSICAL THERAPY NEURO TREATMENT   Patient Name: [name] MRN: [id number] DOB:Dec 02, 1960, 63 y.o., male Today's Date: 07/11/2024   PCP: Aletha Bene, MD   REFERRING PROVIDER: Rosemarie Eather RAMAN, MD   END OF SESSION:  PT End of Session - 07/10/24 1156     Visit Number 3    Number of Visits 25    Date for Recertification  09/20/24    Progress Note Due on Visit 10    PT Start Time 1148    PT Stop Time 1230    PT Time Calculation (min) 42 min    Equipment Utilized During Treatment Gait belt    Activity Tolerance Patient tolerated treatment well    Behavior During Therapy [facility] for tasks assessed/performed           Past Medical History:  Diagnosis Date   Anemia 10/03/2009   Qualifier: Diagnosis of  By: Georgian ROSALEA CHARM Lamar    Anxiety associated with depression 10/11/2013   pt states he is no longer having this issue because he is happy with his job now   Colon cancer [facility])    right colon cancer- adenocarcinoma CEA level isnrmal at 1.8   Coronary artery disease    Diabetes mellitus    Diabetes mellitus type 2 in obese 03/20/2010   Qualifier: Diagnosis of  By: Georgian ROSALEA CHARM Lamar     Erectile dysfunction 06/22/2016   Essential hypertension 06/05/2014   FATTY LIVER DISEASE 11/04/2009   Qualifier: Diagnosis of  By: Abran MD, Norleen SAILOR   Qualifier: Diagnosis of  By: Abran MD, Norleen SAILOR  Last Assessment & Plan:  conirmed by CT scan of abdomen in April 2016 encouraged to minimize simple carbs and fatty foods.   GERD 11/04/2009   Qualifier: Diagnosis of  By: Abran MD, Norleen SAILOR    Great toe pain, right 04/18/2017   Hepatic artery stenosis    History of colon cancer 01/02/2010   Qualifier: Diagnosis of  By: Georgian ROSALEA CHARM Lamar Dr Abran  Last Assessment & Plan:  Follows closely with gastroenterology, Dr Abran   Hyperlipidemia    Hyperlipidemia, mixed 09/07/2010   Qualifier: Diagnosis of  By: Georgian ROSALEA CHARM Lamar    Hypertension    Hypertriglyceridemia 12/14/2010    Hypotestosteronism 06/22/2016   INSOMNIA, CHRONIC 03/20/2010   Qualifier: Diagnosis of  By: Georgian ROSALEA CHARM Lamar Ambien  10 mg daily does not keep asleep AdvilPM is over sedating and has trouble waking up    Iron  deficiency anemia 2011   Low testosterone  06/22/2016   Malignant neoplasm of colon (HCC) 01/02/2010   Qualifier: Diagnosis of  By: Georgian ROSALEA CHARM Lamar Dr Abran    Muscle spasm of back 06/05/2014   Nipple pain 09/30/2016   Obesity    Palpitations 06/05/2014   Preventative health care 10/08/2012   Psoriasis    Psoriatic arthritis (HCC) 08/25/2017   Right knee pain 11/17/2011   Stroke [facility])    Past Surgical History:  Procedure Laterality Date   BUBBLE STUDY  10/21/2020   Procedure: BUBBLE STUDY;  Surgeon: Mona Vinie BROCKS, MD;  Location: [facility] ENDOSCOPY;  Service: Cardiovascular;;   CARPAL TUNNEL RELEASE     RIGHT   CHOLECYSTECTOMY  1994   COLONOSCOPY     CORONARY ARTERY BYPASS GRAFT N/A 09/19/2023   Procedure: CORONARY ARTERY BYPASS GRAFTING (CABG) TIMES TWO USING LEFT INTERNAL MAMMARY ARTERY AND ENDOSCOPICALLY HARVESTED RIGHT GREATER SAPHENOUS VEIN;  Surgeon: Lucas Dorise POUR, MD;  Location: [facility]  OR;  Service: Open Heart Surgery;  Laterality: N/A;   CORONARY/GRAFT ACUTE MI REVASCULARIZATION N/A 10/27/2019   Procedure: Coronary/Graft Acute MI Revascularization;  Surgeon: Darron Deatrice LABOR, MD;  Location: [facility] INVASIVE CV LAB;  Service: Cardiovascular;  Laterality: N/A;   HEMICOLECTOMY     12/17/2009 right   LEFT HEART CATH AND CORONARY ANGIOGRAPHY N/A 10/27/2019   Procedure: LEFT HEART CATH AND CORONARY ANGIOGRAPHY;  Surgeon: Darron Deatrice LABOR, MD;  Location: [facility] INVASIVE CV LAB;  Service: Cardiovascular;  Laterality: N/A;   LOOP RECORDER INSERTION N/A 10/21/2020   Procedure: LOOP RECORDER INSERTION;  Surgeon: Kelsie Agent, MD;  Location: [facility] INVASIVE CV LAB;  Service: Cardiovascular;  Laterality: N/A;   POLYPECTOMY     RIGHT/LEFT HEART CATH AND CORONARY ANGIOGRAPHY Bilateral 08/12/2023    Procedure: RIGHT/LEFT HEART CATH AND CORONARY ANGIOGRAPHY;  Surgeon: Darron Deatrice LABOR, MD;  Location: [facility] INVASIVE CV LAB;  Service: Cardiovascular;  Laterality: Bilateral;   TEE WITHOUT CARDIOVERSION N/A 10/21/2020   Procedure: TRANSESOPHAGEAL ECHOCARDIOGRAM (TEE);  Surgeon: Mona Vinie BROCKS, MD;  Location: [facility] ENDOSCOPY;  Service: Cardiovascular;  Laterality: N/A;   TEE WITHOUT CARDIOVERSION N/A 09/19/2023   Procedure: TRANSESOPHAGEAL ECHOCARDIOGRAM (TEE);  Surgeon: Lucas Dorise POUR, MD;  Location: [facility] OR;  Service: Open Heart Surgery;  Laterality: N/A;   Patient Active Problem List   Diagnosis Date Noted   CVA (cerebral vascular accident) (HCC) 06/21/2024   S/P CABG x 2 09/22/2023   Coronary artery disease 09/19/2023   Annual physical exam 05/20/2023   Screen for colon cancer 05/20/2023   History of vitamin D  deficiency 05/20/2023   Acute reaction to situational stress 05/20/2023   Psychophysiological insomnia 03/10/2022   Hypertension associated with diabetes (HCC) 11/25/2021   Hyperlipidemia associated with type 2 diabetes mellitus (HCC) 11/25/2021   Diabetes mellitus (HCC) 11/25/2021   History of stroke 10/21/2020   Chronic systolic congestive heart failure (HCC) 11/30/2019   Coronary artery disease involving native coronary artery 11/30/2019   History of ST elevation myocardial infarction (STEMI) 10/27/2019   Psoriatic arthritis (HCC) 08/25/2017   History of colon cancer 01/02/2010   Esophageal reflux 11/04/2009    ONSET DATE: 06/21/2024  REFERRING DIAG:  I63.9 (ICD-10-CM) - Cryptogenic stroke (HCC)  R29.818 (ICD-10-CM) - Hemisensory deficit    THERAPY DIAG:  Muscle weakness (generalized)  Other lack of coordination  Cerebrovascular accident (CVA), unspecified mechanism (HCC)  Muscle weakness of lower extremity  Other abnormalities of gait and mobility  Unsteadiness on feet  Rationale for Evaluation and Treatment: Rehabilitation  SUBJECTIVE:  Pt reports he feels  his biggest issue is his lack of sensation on left hand.  SUBJECTIVE STATEMENT: Patient began to have symptoms of stroke the night of 06/20/2024, but did not go to ER until 06/21/2024. He was noted to have L sided weakness along with decreased sensation. Pt. Did not receive tpa at hospital because it was too late. Patient reports he was in hospital for one day then released. He continues to have numbness and weakness on L side. Pt initially had a stroke effecting R side about 2-3 years ago that he was still dealing with including decreased balance and right sided weakness. He was using cane prior to this most recent stroke for ambulation, but is now using 3 wheeled walker. Patient reports he gets fatigued very easily with simple tasks. He also reports intermittent tremor in L leg.    Pt accompanied by: friend.   PERTINENT HISTORY: Patient experienced a right sided stroke on 06/20/2024 resulting in L hemiparesis and loss of sensation.  PAIN:  Are you having pain? No  PRECAUTIONS: Fall  RED FLAGS: None   WEIGHT BEARING RESTRICTIONS: No  FALLS: Has patient fallen in last 6 months? Yes. Number of falls 5x on 06/21/2024.   LIVING ENVIRONMENT: Lives with: lives with their family and lives alone Lives in: House/apartment Stairs: Yes: External: 2 steps; none Has following equipment at home: Single point cane, Environmental Consultant - 2 wheeled, Wheelchair (manual), shower chair, and Grab bars  PLOF: Independent  PATIENT GOALS: Improve LE strength, gait, balance, independence with mobility and functional tasks at home, and yard work.   OBJECTIVE:  Note: Objective measures were completed at Evaluation unless otherwise noted.  DIAGNOSTIC FINDINGS: IMPRESSION: 1. Multiple small acute ischemic infarcts within the right  hemisphere affecting parts of the right parietal cortex, right temporal cortex, and the right occipital lobe, with punctate foci of acute ischemia in the right frontal white matter. 2. Multifocal hyperintense T2-weighted signal within the cerebral white matter, most commonly due to chronic small vessel disease.    COGNITION: Overall cognitive status: Within functional limits for tasks assessed   SENSATION:L lower extremity.  Light touch: Impaired  Proprioception: Impaired   COORDINATION: Decreased speed and accuracy with toe tap.    MUSCLE TONE: LLE: Within functional limits   POSTURE: rounded shoulders  LOWER EXTREMITY ROM:     Active  Right Eval Left Eval  Hip flexion    Hip extension    Hip abduction    Hip adduction    Hip internal rotation    Hip external rotation    Knee flexion    Knee extension    Ankle dorsiflexion    Ankle plantarflexion    Ankle inversion    Ankle eversion     (Blank rows = not tested)  LOWER EXTREMITY MMT:    MMT Right Eval Left Eval  Hip flexion 4 4  Hip extension    Hip abduction 5 4-  Hip adduction    Hip internal rotation    Hip external rotation    Knee flexion 5 5  Knee extension 5 5  Ankle dorsiflexion 5 5  Ankle plantarflexion    Ankle inversion    Ankle eversion    (Blank rows = not tested)  BED MOBILITY:  Independent  TRANSFERS: Independent  RAMP:  Not tested  CURB:  Not tested  STAIRS: Not tested GAIT: Findings: Gait Characteristics: ataxic, Distance walked: 100', and Comments: Very slight ataxia with gait.  FUNCTIONAL TESTS:  5 times sit to stand: 23.47 Timed up and go (TUG): 13.4 without AD. 10 MWT:0.77 m/s without  AD. 6 minute walk test: Perform at initial treatment. Berg Balance Scale:  Item Test date: 06/28/2024 Date:  Date:   Sitting to standing 4. able to stand without using hands and stabilize independently Insert SmartPhrase OPRCBERGREEVAL Insert SmartPhrase OPRCBERGREEVAL  2.  Standing unsupported 4. able to stand safely for 2 minutes    3. Sitting with back unsupported, feet supported 4. able to sit safely and securely for 2 minutes    4. Standing to sitting 3. controls descent by using hands    5. Pivot transfer  4. able to transfer safely with minor use of hands    6. Standing unsupported with eyes closed 4. able to stand 10 seconds safely    7. Standing unsupported with feet together 4. able to place feet together independently and stand 1 minute safely    8. Reaching forward with outstretched arms while standing 4. can reach forward confidently 25 cm (10 inches)    9. Pick up object from the floor from standing 3. able to pick up slipper but needs supervision    10. Turning to look behind over left and right shoulders while standing 3. looks behind one side only, other side shows less weight shift    11. Turn 360 degrees 3. able to turn 360 degrees safely, one side only, in 4 seconds or less    12. Place alternate foot on step or stool while standing unsupported 4. ble to stand independently and safely and complete 8 steps in 20 seconds    13. Standing unsupported one foot in front 3. able to place foot ahead independently and hold 30 seconds    14. Standing on one leg 4. able to lift leg independently and hold > 10 seconds     Total Score 51/56 Total Score:    Total Score:      PATIENT SURVEYS:  ABC scale: The Activities-Specific Balance Confidence (ABC) Scale 0% 10 20 30  40 50 60 70 80 90 100% No confidence<->completely confident  How confident are you that you will not lose your balance or become unsteady when you . . .   Date tested 06/28/2024  Walk around the house 50%  2. Walk up or down stairs 0%  3. Bend over and pick up a slipper from in front of a closet floor 30%  4. Reach for a small can off a shelf at eye level 30%  5. Stand on tip toes and reach for something above your head 30%  6. Stand on a chair and reach for something 0%  7. Sweep the  floor 0%  8. Walk outside the house to a car parked in the driveway 40%  9. Get into or out of a car 50%  10. Walk across a parking lot to the mall 40%  11. Walk up or down a ramp 20%  12. Walk in a crowded mall where people rapidly walk past you 20%  13. Are bumped into by people as you walk through the mall 0%  14. Step onto or off of an escalator while you are holding onto the railing 50%  15. Step onto or off an escalator while holding onto parcels such that you cannot hold onto the railing 0%  16. Walk outside on icy sidewalks 0%  Total: #/16 22.5%  TREATMENT DATE: 07/10/2024   *Patient presented to clinic with an Upright RW today  TA- To improve functional movements patterns for everyday tasks    Sit to stand with elevated right LE on airex pad 2 x 10 w/o UE support     NMR: To facilitate reeducation of movement, balance, posture, coordination, and/or proprioception/kinesthetic sense.  -Step tap onto step without UE support x 20 reps (unsteady initially but improved with reps)  -Fwd step up onto Step  2 x 10 ea LE, no UE support -Tandem stand- hold 30 sec x 3 trials ea LE (unsteady but improved with practice)  -Dynamic march on airex pad without UE support x 15 reps  Narrowed BOS on airex pad- EO,then EC x 30 sec x 3 each -SLS - approx 4-8 sec each LE - with some intermittent reaching with BUE    Unless otherwise stated, CGA was provided and gait belt donned in order to ensure pt safety   PATIENT EDUCATION: Education details: HEP, safety in home, stroke education. Person educated: Patient and patient's friend Education method: Explanation, Demonstration, Verbal cues, and Handouts Education comprehension: verbalized understanding and returned demonstration  HOME EXERCISE PROGRAM: Access Code: [email or web address] URL:  [email or web address] Date: 06/28/2024 Prepared by: Norman Sharps  Exercises - Sit to Stand with Arms Crossed  - 1 x daily - 7 x weekly - 2 sets - 10 reps - Standing Tandem Balance with Counter Support  - 1 x daily - 7 x weekly - 1 sets - 3 reps - 30 second hold - Single Leg Stance  - 1 x daily - 7 x weekly - 1 sets - 3 reps - 20 second hold  GOALS: Goals reviewed with patient? Yes  SHORT TERM GOALS: Target date: 07/12/2024  09/21/2023 Baseline: Patient will be independent with HEP by discharge allowing him to perform exercises at home with decreased difficulty and improved safety.  Goal status: INITIAL    LONG TERM GOALS: Target date: 09/20/2024  1.  Patient (> 49 years old) will complete five times sit to stand test in < 15 seconds indicating an increased LE strength and improved balance. Baseline: 23.47 without AD. Goal status: INITIAL  2.  Patient will increase ABC Scale by 40% by discharge allowing him to ambulate and perform functional activities with decreased risk of falling.  Baseline: 22.5%. Goal status: INITIAL   3.  Patient will reduce timed up and go to <11 seconds to reduce fall risk and demonstrate improved transfer/gait ability. Baseline: 13.4'' without Ad. Goal status: INITIAL  4.  Patient will increase 10 meter walk test to >1.28m/s as to improve gait speed for better community ambulation and to reduce fall risk. Baseline: 0.77 meters/second Goal status: INITIAL  5.  Patient will increase six minute walk test distance to >1000 for progression to community ambulator and improve gait ability Baseline: 800 ft Goal status: INITIAL    ASSESSMENT:  CLINICAL IMPRESSION: Treatment focused on dynamic balance- including performing task without UE support and emphasized weight shifting to left side. He performed well- guarded initially with unsteadiness but adapted well to all task quickly.  Pt asks questions throughout about form and demonstrates  great willingness to complete activities accurately. Pt will continue to benefit from skilled physical therapy intervention to address impairments, improve QOL, and attain therapy goals.    OBJECTIVE IMPAIRMENTS: Abnormal gait, decreased activity tolerance, decreased balance, decreased coordination, decreased endurance, difficulty walking, decreased strength, impaired sensation, and impaired UE functional use.   ACTIVITY LIMITATIONS: carrying,  lifting, bending, standing, squatting, stairs, transfers, and bed mobility  PARTICIPATION LIMITATIONS: meal prep, cleaning, laundry, driving, shopping, community activity, and yard work  PERSONAL FACTORS: 1-2 comorbidities: HTN, CHF, CAD, CVA are also affecting patient's functional outcome.   REHAB POTENTIAL: Good  CLINICAL DECISION MAKING: Stable/uncomplicated  EVALUATION COMPLEXITY: Moderate  PLAN:  PT FREQUENCY: 1-2x/week  PT DURATION: 12 weeks  PLANNED INTERVENTIONS: [id number]- PT Re-evaluation, 97750- Physical Performance Testing, 97110-Therapeutic exercises, 97530- Therapeutic activity, 97112- Neuromuscular re-education, 97535- Self Care, [id number]- Manual therapy, [id number]- Gait training, Patient/Family education, Balance training, Stair training, and Vestibular training  PLAN FOR NEXT SESSION:  -Continue working on confidence with gait- Using least restrictive assistive device -Continue working to improve dynamic balance and LE coordination.     Reyes LOISE London PT Physical Therapist- [place]  [facility]        "

## 2024-07-16 ENCOUNTER — Ambulatory Visit: Admitting: Physical Therapy

## 2024-07-16 ENCOUNTER — Ambulatory Visit: Admitting: Occupational Therapy

## 2024-07-16 DIAGNOSIS — I639 Cerebral infarction, unspecified: Secondary | ICD-10-CM | POA: Diagnosis not present

## 2024-07-16 DIAGNOSIS — R2689 Other abnormalities of gait and mobility: Secondary | ICD-10-CM

## 2024-07-16 DIAGNOSIS — M6281 Muscle weakness (generalized): Secondary | ICD-10-CM

## 2024-07-16 DIAGNOSIS — R2681 Unsteadiness on feet: Secondary | ICD-10-CM

## 2024-07-16 DIAGNOSIS — R278 Other lack of coordination: Secondary | ICD-10-CM

## 2024-07-16 NOTE — Therapy (Signed)
 " OUTPATIENT OCCUPATIONAL THERAPY NEURO TREATMENT NOTE  Patient Name: [name] MRN: [id number] DOB:16-Apr-1961, 63 y.o., male Today's Date: 07/16/2024  PCP: Aletha Bene, MD REFERRING PROVIDER: Rosemarie Eather RAMAN, MD  END OF SESSION:  OT End of Session - 07/16/24 1412     Visit Number 3    Number of Visits 12    Date for Recertification  09/20/24    OT Start Time 1300    OT Stop Time 1345    OT Time Calculation (min) 45 min    Activity Tolerance Patient tolerated treatment well    Behavior During Therapy [facility] for tasks assessed/performed          Past Medical History:  Diagnosis Date   Anemia 10/03/2009   Qualifier: Diagnosis of  By: Georgian ROSALEA CHARM Lamar    Anxiety associated with depression 10/11/2013   pt states he is no longer having this issue because he is happy with his job now   Colon cancer [facility])    right colon cancer- adenocarcinoma CEA level isnrmal at 1.8   Coronary artery disease    Diabetes mellitus    Diabetes mellitus type 2 in obese 03/20/2010   Qualifier: Diagnosis of  By: Georgian ROSALEA CHARM Lamar     Erectile dysfunction 06/22/2016   Essential hypertension 06/05/2014   FATTY LIVER DISEASE 11/04/2009   Qualifier: Diagnosis of  By: Abran MD, Norleen SAILOR   Qualifier: Diagnosis of  By: Abran MD, Norleen SAILOR  Last Assessment & Plan:  conirmed by CT scan of abdomen in April 2016 encouraged to minimize simple carbs and fatty foods.   GERD 11/04/2009   Qualifier: Diagnosis of  By: Abran MD, Norleen SAILOR    Great toe pain, right 04/18/2017   Hepatic artery stenosis    History of colon cancer 01/02/2010   Qualifier: Diagnosis of  By: Georgian ROSALEA CHARM Lamar Dr Abran  Last Assessment & Plan:  Follows closely with gastroenterology, Dr Abran   Hyperlipidemia    Hyperlipidemia, mixed 09/07/2010   Qualifier: Diagnosis of  By: Georgian ROSALEA CHARM Lamar    Hypertension    Hypertriglyceridemia 12/14/2010   Hypotestosteronism 06/22/2016   INSOMNIA, CHRONIC 03/20/2010   Qualifier: Diagnosis of  By:  Georgian ROSALEA CHARM Lamar Ambien  10 mg daily does not keep asleep AdvilPM is over sedating and has trouble waking up    Iron  deficiency anemia 2011   Low testosterone  06/22/2016   Malignant neoplasm of colon (HCC) 01/02/2010   Qualifier: Diagnosis of  By: Georgian ROSALEA CHARM Lamar Dr Abran    Muscle spasm of back 06/05/2014   Nipple pain 09/30/2016   Obesity    Palpitations 06/05/2014   Preventative health care 10/08/2012   Psoriasis    Psoriatic arthritis (HCC) 08/25/2017   Right knee pain 11/17/2011   Stroke [facility])    Past Surgical History:  Procedure Laterality Date   BUBBLE STUDY  10/21/2020   Procedure: BUBBLE STUDY;  Surgeon: Mona Vinie BROCKS, MD;  Location: [facility] ENDOSCOPY;  Service: Cardiovascular;;   CARPAL TUNNEL RELEASE     RIGHT   CHOLECYSTECTOMY  1994   COLONOSCOPY     CORONARY ARTERY BYPASS GRAFT N/A 09/19/2023   Procedure: CORONARY ARTERY BYPASS GRAFTING (CABG) TIMES TWO USING LEFT INTERNAL MAMMARY ARTERY AND ENDOSCOPICALLY HARVESTED RIGHT GREATER SAPHENOUS VEIN;  Surgeon: Lucas Dorise POUR, MD;  Location: [facility] OR;  Service: Open Heart Surgery;  Laterality: N/A;   CORONARY/GRAFT ACUTE MI REVASCULARIZATION N/A 10/27/2019   Procedure: Coronary/Graft Acute MI  Revascularization;  Surgeon: Darron Deatrice LABOR, MD;  Location: [facility] INVASIVE CV LAB;  Service: Cardiovascular;  Laterality: N/A;   HEMICOLECTOMY     12/17/2009 right   LEFT HEART CATH AND CORONARY ANGIOGRAPHY N/A 10/27/2019   Procedure: LEFT HEART CATH AND CORONARY ANGIOGRAPHY;  Surgeon: Darron Deatrice LABOR, MD;  Location: [facility] INVASIVE CV LAB;  Service: Cardiovascular;  Laterality: N/A;   LOOP RECORDER INSERTION N/A 10/21/2020   Procedure: LOOP RECORDER INSERTION;  Surgeon: Kelsie Agent, MD;  Location: [facility] INVASIVE CV LAB;  Service: Cardiovascular;  Laterality: N/A;   POLYPECTOMY     RIGHT/LEFT HEART CATH AND CORONARY ANGIOGRAPHY Bilateral 08/12/2023   Procedure: RIGHT/LEFT HEART CATH AND CORONARY ANGIOGRAPHY;  Surgeon: Darron Deatrice LABOR, MD;   Location: [facility] INVASIVE CV LAB;  Service: Cardiovascular;  Laterality: Bilateral;   TEE WITHOUT CARDIOVERSION N/A 10/21/2020   Procedure: TRANSESOPHAGEAL ECHOCARDIOGRAM (TEE);  Surgeon: Mona Vinie BROCKS, MD;  Location: [facility] ENDOSCOPY;  Service: Cardiovascular;  Laterality: N/A;   TEE WITHOUT CARDIOVERSION N/A 09/19/2023   Procedure: TRANSESOPHAGEAL ECHOCARDIOGRAM (TEE);  Surgeon: Lucas Dorise POUR, MD;  Location: [facility] OR;  Service: Open Heart Surgery;  Laterality: N/A;   Patient Active Problem List   Diagnosis Date Noted   CVA (cerebral vascular accident) (HCC) 06/21/2024   S/P CABG x 2 09/22/2023   Coronary artery disease 09/19/2023   Annual physical exam 05/20/2023   Screen for colon cancer 05/20/2023   History of vitamin D  deficiency 05/20/2023   Acute reaction to situational stress 05/20/2023   Psychophysiological insomnia 03/10/2022   Hypertension associated with diabetes (HCC) 11/25/2021   Hyperlipidemia associated with type 2 diabetes mellitus (HCC) 11/25/2021   Diabetes mellitus (HCC) 11/25/2021   History of stroke 10/21/2020   Chronic systolic congestive heart failure (HCC) 11/30/2019   Coronary artery disease involving native coronary artery 11/30/2019   History of ST elevation myocardial infarction (STEMI) 10/27/2019   Psoriatic arthritis (HCC) 08/25/2017   History of colon cancer 01/02/2010   Esophageal reflux 11/04/2009    ONSET DATE: 06/21/24  REFERRING DIAG: CVA with left Hemiparesis, and sensory loss  THERAPY DIAG:  Muscle weakness (generalized)  Rationale for Evaluation and Treatment: Rehabilitation  SUBJECTIVE:   SUBJECTIVE STATEMENT:  Pt. reports that he has changed his mindset, and is now determined to beat this stroke. Pt. Is now engaging his left hand more during daily tasks. Pt accompanied by: self and friend  PERTINENT HISTORY: Pt. is a 63 y.o male who was admitted to the hospital from 12/4-12/11/2023 with a CVA with left hemiparesis, and sensory loss. Imaging  revealed an acute right parietal, temporal, and occipital Ischemic CVA. PMHx includes: HTN associated with DM, Hyperlipidemia associated with DM, CAD, s/p CABG, Hx of CVA.  PRECAUTIONS: None  WEIGHT BEARING RESTRICTIONS: No  PAIN Are you having pain? No  FALLS: Has patient fallen in last 6 months? Yes, repots 4-5 falls recently  LIVING ENVIRONMENT: Lives with: Alone Stairs: 1-2 stes to enter Has following equipment at home: Rollator walker with 3 wheels  PLOF: Reports having had a previous CVA  PATIENT GOALS: To have the sensation in the LUE completely resolve  OBJECTIVE:  Note: Objective measures were completed at Evaluation unless otherwise noted.  HAND DOMINANCE: Left hand dominant.  ADLs: Overall ADLs: Pt. reports that his LUE is numb, therefore he is completely unable to use it during any ADL/ADL tasks. Pt. reports that he is using the RUE for all ADL tasks. Pt. reports having to use his RUE to support his  LUE when attempting to pick up, or hold a drink. Due to agitation, Pt. Did not provide additional information about ADL functioning.   Transfers/ambulation related to ADLs: Eating:  Grooming:  UB Dressing:  LB Dressing:  Toileting:  Bathing:  Tub Shower transfers:  IADLs: Shopping:  Light housekeeping:  Meal Prep:  Community mobility:  Medication management:  Financial management:  Handwriting:   MOBILITY STATUS: Pt. Arrived to the clinic with a 3 wheeled rollator walker.     FUNCTIONAL OUTCOME MEASURES: Eval: MAM-20: Not assessed 2/2 agitation  UPPER EXTREMITY ROM:    Active ROM Right Eval WFL Left eval  Shoulder flexion  132  Shoulder abduction  136  Shoulder adduction    Shoulder extension    Shoulder internal rotation    Shoulder external rotation    Elbow flexion  WFL  Elbow extension  4  Wrist flexion    Wrist extension  70  Wrist ulnar deviation  32  Wrist radial deviation  18  Wrist pronation    Wrist supination    (Blank rows =  not tested)  UPPER EXTREMITY MMT:     MMT Right Eval WFL  Overall Left Eval 4-/5 overall  Shoulder flexion    Shoulder abduction    Shoulder adduction    Shoulder extension    Shoulder internal rotation    Shoulder external rotation    Middle trapezius    Lower trapezius    Elbow flexion    Elbow extension    Wrist flexion    Wrist extension    Wrist ulnar deviation    Wrist radial deviation    Wrist pronation    Wrist supination    (Blank rows = not tested)  HAND FUNCTION: Grip strength: Right: 90 lbs; Left: 38 lbs, Lateral pinch: Right: 15 lbs, Left: 12 lbs, and 3 point pinch: Right: 13 lbs, Left: 10 lbs  07/16/24: Grip strength: Right: 90 lbs; Left: 106 lbs, Lateral pinch: Right: 15 lbs, Left: 19 lbs, and 3 point pinch: Right: 13 lbs, Left:15 lbs    COORDINATION: 9 Hole Peg test: Right: 36 sec; Left: Pt. was able to formulate a 2pt. grasp pattern with the left hand in anticipation for grasping the 9 hole pegs from a horizontal position, however was unable to secure a grasp on any of the pegs.  07/16/24: Pt. Was able to grasp 1( out of 9) peg from the horizontal position, and place in vertically in the pegboard.  SENSATION: LUE Light touch: Impaired  Proprioception: Impaired   EDEMA: N/A  MUSCLE TONE: WFL  COGNITION: Overall cognitive status:  WFL for tasks performed  VISION: N/T 2/2 agitation  PRAXIS: Impaired: Motor planning                                                                                 TREATMENT DATE: 07/16/24   Therapeutic Activities:   -Facilitated neuroplastic LUE, and hand retraining to promote proprioceptive, and sensory awareness using:     -weightbearing/proprioception through the LUE at the firm mat surface  -vibration to both the volar, and dorsal surfaces of the left hand and forearm   -soft tissue massage to the volar and  dorsal aspects of the left forearm, hand digits, and thumb to increase circulation, and tactile  input.  -grasping various sized tactile items from sensory container of rice with vision occluded, and delineating between a progression of textures from rough to soft with vision occluded.  -Pt. education was provided about modulation of the appropriate amount of graded pressure when grasping items as Pt. tends to over grip items. -Pt. education was provided about compensatory strategies for sensory changes in the LUE, and hand.  PATIENT EDUCATION: Education details: Ecologist, proprioception/weightbearing, compensatory strategies, modulation of graded pressure 2/2 over gripping,  Person educated: Patient, Caregiver: Friend Education method: Explanation, Demonstration, Verbal cues, and Handouts Education comprehension: verbalized understanding, returned demonstration, verbal cues required, tactile cues required, and needs further education  HOME EXERCISE PROGRAM:  -Vibration to the hand/forearm -STM  to the hand/forearm  -Blue theraputty hand strengthening exercises with a HEP through [employer]  GOALS: Goals reviewed with patient? Yes  SHORT TERM GOALS: Target date: 08/09/2024    Pt. Will be independent with HEPs for LUE strength, and [facility]  Baseline: Eval: Pt. education was provided about a HEP for the LUE. Goal status: INITIAL  LONG TERM GOALS: Target date: 09/20/2024  Pt. Will improve Left hand grip strength by 5# to be able to be able to securely hold items in his left hand Baseline: Eval: Right: 90 lbs, Left: 38# Goal status: INITIAL  2.  Pt. Will improve left hand pinch strength to be able to daily care items with lids. Baseline: Eval: Lteral pinch strength: Right: 15#, Left: 12# 3pt. Pinch strength: Right: 13#, Left: 10# Goal status: INITIAL  3.  Pt. Will improve left hand [facility] skills to be able to grasp, manipulate and place 2 pegs on the 9 hole peg test Baseline: Eval: Right: 36 sec  Left:  Pt. was able to formulate a 2pt. grasp pattern with the left hand in  anticipation for grasping the 9 hole pegs from a horizontal position, however was unable to secure a grasp on any of the pegs. Goal status: INITIAL  4.  Pt. Will LUE strength to assist with ADLs, and IADLs Baseline: Eval: Right: WFL, Left: 4-/5 Goal status: INITIAL  5.  Pt. Will identify 3 compensatory strategies for decreased sensation during ADLs, and IADL tasks. Baseline: Eval: Education to be provided Goal status: INITIAL   ASSESSMENT:  CLINICAL IMPRESSION:  Pt. reports that he has been working on strengthening with his left hand at home, and has been engaging his left hand more during daily ADL, and IADL tasks. Pt. is now using his left hand to apply toothpaste to the toothbrush, as well as using his toothbrush to brush his teeth. Pt. Is now able to put his jacket on more automatically, and efficiently. These are tasks that the Pt. was unable to perform with the left hand initially. Pt. has made excellent progress with left grip strength, lateral, and 3pt. Pinch strength. Pt. Is now able to grasp and place 1 peg in the 9 [facility] Peg Test which also reflects an improvement from the initial evaluation. Pt. was able to consistently feel vibration, as well as some tactile input through soft tissue massage, although diminished from the right. Pt. was able to feel rice grains, however was limited with distinguishing between the container of rice grains, and larger objects. Pt. Was able to feel rougher textures including velcro, and terry cloth. These sensory changes in the left hand reflect a change from the initial evaluation. Pt. Is unable  to identify large objects for stereognosis. Pt. continues to benefit from OT services to work on improving dominant LUE functioning in order to increase engagement during ADLs, and IADL tasks, as well as to provide education about compensatory strategies for LUE sensory loss during ADLs, and IADL tasks.  PERFORMANCE DEFICITS: in functional skills including ADLs,  IADLs, coordination, dexterity, proprioception, sensation, strength, Fine motor control, Gross motor control, decreased knowledge of use of DME, and UE functional use, and psychosocial skills including coping strategies, environmental adaptation, interpersonal interactions, and routines and behaviors.   IMPAIRMENTS: are limiting patient from ADLs, IADLs, and leisure.   CO-MORBIDITIES: may have co-morbidities  that affects occupational performance. Patient will benefit from skilled OT to address above impairments and improve overall function.  MODIFICATION OR ASSISTANCE TO COMPLETE EVALUATION: Min-Moderate modification of tasks or assist with assess necessary to complete an evaluation.  OT OCCUPATIONAL PROFILE AND HISTORY: Detailed assessment: Review of records and additional review of physical, cognitive, psychosocial history related to current functional performance.  CLINICAL DECISION MAKING: Moderate - several treatment options, min-mod task modification necessary  REHAB POTENTIAL: Good  EVALUATION COMPLEXITY: Moderate    PLAN:  OT FREQUENCY: 1 OT DURATION: 12 weeks  PLANNED INTERVENTIONS: 97168 OT Re-evaluation, 97535 self care/ADL training, 02889 therapeutic exercise, 97530 therapeutic activity, 97112 neuromuscular re-education, 97140 manual therapy, functional mobility training, energy conservation, patient/family education, and DME and/or AE instructions  RECOMMENDED OTHER SERVICES: PT  CONSULTED AND AGREED WITH PLAN OF CARE: Patient and friend  PLAN FOR NEXT SESSION: Treatment, completion of MAM-20-If Pt. Is agreeable  Richardson Otter, MS, OTR/L  07/16/2024, 3:10 PM           "

## 2024-07-16 NOTE — Therapy (Unsigned)
 " OUTPATIENT PHYSICAL THERAPY NEURO TREATMENT   Patient Name: [name] MRN: [id number] DOB:Nov 13, 1960, 63 y.o., male Today's Date: 07/16/2024   PCP: Aletha Bene, MD   REFERRING PROVIDER: Rosemarie Eather RAMAN, MD   END OF SESSION:  PT End of Session - 07/16/24 1402     Visit Number 4    Number of Visits 25    Date for Recertification  09/20/24    Progress Note Due on Visit 10    PT Start Time 1402    PT Stop Time 1445    PT Time Calculation (min) 43 min    Equipment Utilized During Treatment Gait belt    Activity Tolerance Patient tolerated treatment well    Behavior During Therapy [facility] for tasks assessed/performed           Past Medical History:  Diagnosis Date   Anemia 10/03/2009   Qualifier: Diagnosis of  By: Georgian ROSALEA CHARM Lamar    Anxiety associated with depression 10/11/2013   pt states he is no longer having this issue because he is happy with his job now   Colon cancer [facility])    right colon cancer- adenocarcinoma CEA level isnrmal at 1.8   Coronary artery disease    Diabetes mellitus    Diabetes mellitus type 2 in obese 03/20/2010   Qualifier: Diagnosis of  By: Georgian ROSALEA CHARM Lamar     Erectile dysfunction 06/22/2016   Essential hypertension 06/05/2014   FATTY LIVER DISEASE 11/04/2009   Qualifier: Diagnosis of  By: Abran MD, Norleen SAILOR   Qualifier: Diagnosis of  By: Abran MD, Norleen SAILOR  Last Assessment & Plan:  conirmed by CT scan of abdomen in April 2016 encouraged to minimize simple carbs and fatty foods.   GERD 11/04/2009   Qualifier: Diagnosis of  By: Abran MD, Norleen SAILOR    Great toe pain, right 04/18/2017   Hepatic artery stenosis    History of colon cancer 01/02/2010   Qualifier: Diagnosis of  By: Georgian ROSALEA CHARM Lamar Dr Abran  Last Assessment & Plan:  Follows closely with gastroenterology, Dr Abran   Hyperlipidemia    Hyperlipidemia, mixed 09/07/2010   Qualifier: Diagnosis of  By: Georgian ROSALEA CHARM Lamar    Hypertension    Hypertriglyceridemia 12/14/2010    Hypotestosteronism 06/22/2016   INSOMNIA, CHRONIC 03/20/2010   Qualifier: Diagnosis of  By: Georgian ROSALEA CHARM Lamar Ambien  10 mg daily does not keep asleep AdvilPM is over sedating and has trouble waking up    Iron  deficiency anemia 2011   Low testosterone  06/22/2016   Malignant neoplasm of colon (HCC) 01/02/2010   Qualifier: Diagnosis of  By: Georgian ROSALEA CHARM Lamar Dr Abran    Muscle spasm of back 06/05/2014   Nipple pain 09/30/2016   Obesity    Palpitations 06/05/2014   Preventative health care 10/08/2012   Psoriasis    Psoriatic arthritis (HCC) 08/25/2017   Right knee pain 11/17/2011   Stroke [facility])    Past Surgical History:  Procedure Laterality Date   BUBBLE STUDY  10/21/2020   Procedure: BUBBLE STUDY;  Surgeon: Mona Vinie BROCKS, MD;  Location: [facility] ENDOSCOPY;  Service: Cardiovascular;;   CARPAL TUNNEL RELEASE     RIGHT   CHOLECYSTECTOMY  1994   COLONOSCOPY     CORONARY ARTERY BYPASS GRAFT N/A 09/19/2023   Procedure: CORONARY ARTERY BYPASS GRAFTING (CABG) TIMES TWO USING LEFT INTERNAL MAMMARY ARTERY AND ENDOSCOPICALLY HARVESTED RIGHT GREATER SAPHENOUS VEIN;  Surgeon: Lucas Dorise POUR, MD;  Location: [facility]  OR;  Service: Open Heart Surgery;  Laterality: N/A;   CORONARY/GRAFT ACUTE MI REVASCULARIZATION N/A 10/27/2019   Procedure: Coronary/Graft Acute MI Revascularization;  Surgeon: Darron Deatrice LABOR, MD;  Location: [facility] INVASIVE CV LAB;  Service: Cardiovascular;  Laterality: N/A;   HEMICOLECTOMY     12/17/2009 right   LEFT HEART CATH AND CORONARY ANGIOGRAPHY N/A 10/27/2019   Procedure: LEFT HEART CATH AND CORONARY ANGIOGRAPHY;  Surgeon: Darron Deatrice LABOR, MD;  Location: [facility] INVASIVE CV LAB;  Service: Cardiovascular;  Laterality: N/A;   LOOP RECORDER INSERTION N/A 10/21/2020   Procedure: LOOP RECORDER INSERTION;  Surgeon: Kelsie Agent, MD;  Location: [facility] INVASIVE CV LAB;  Service: Cardiovascular;  Laterality: N/A;   POLYPECTOMY     RIGHT/LEFT HEART CATH AND CORONARY ANGIOGRAPHY Bilateral 08/12/2023    Procedure: RIGHT/LEFT HEART CATH AND CORONARY ANGIOGRAPHY;  Surgeon: Darron Deatrice LABOR, MD;  Location: [facility] INVASIVE CV LAB;  Service: Cardiovascular;  Laterality: Bilateral;   TEE WITHOUT CARDIOVERSION N/A 10/21/2020   Procedure: TRANSESOPHAGEAL ECHOCARDIOGRAM (TEE);  Surgeon: Mona Vinie BROCKS, MD;  Location: [facility] ENDOSCOPY;  Service: Cardiovascular;  Laterality: N/A;   TEE WITHOUT CARDIOVERSION N/A 09/19/2023   Procedure: TRANSESOPHAGEAL ECHOCARDIOGRAM (TEE);  Surgeon: Lucas Dorise POUR, MD;  Location: [facility] OR;  Service: Open Heart Surgery;  Laterality: N/A;   Patient Active Problem List   Diagnosis Date Noted   CVA (cerebral vascular accident) (HCC) 06/21/2024   S/P CABG x 2 09/22/2023   Coronary artery disease 09/19/2023   Annual physical exam 05/20/2023   Screen for colon cancer 05/20/2023   History of vitamin D  deficiency 05/20/2023   Acute reaction to situational stress 05/20/2023   Psychophysiological insomnia 03/10/2022   Hypertension associated with diabetes (HCC) 11/25/2021   Hyperlipidemia associated with type 2 diabetes mellitus (HCC) 11/25/2021   Diabetes mellitus (HCC) 11/25/2021   History of stroke 10/21/2020   Chronic systolic congestive heart failure (HCC) 11/30/2019   Coronary artery disease involving native coronary artery 11/30/2019   History of ST elevation myocardial infarction (STEMI) 10/27/2019   Psoriatic arthritis (HCC) 08/25/2017   History of colon cancer 01/02/2010   Esophageal reflux 11/04/2009    ONSET DATE: 06/21/2024  REFERRING DIAG:  I63.9 (ICD-10-CM) - Cryptogenic stroke (HCC)  R29.818 (ICD-10-CM) - Hemisensory deficit    THERAPY DIAG:  Muscle weakness (generalized)  Other lack of coordination  Cerebrovascular accident (CVA), unspecified mechanism (HCC)  Muscle weakness of lower extremity  Other abnormalities of gait and mobility  Unsteadiness on feet  Rationale for Evaluation and Treatment: Rehabilitation  SUBJECTIVE:  Pt arrives to PT  motivated to Participate.  States that he is in a new mindset, claiming that he is going to beat this.  Is hopeful to get more PT visits if possible.  Wants to use treadmill, elliptical, and squat assist machine at home. Will bring pictures of equipment next session for PT review.  SUBJECTIVE STATEMENT: Patient began to have symptoms of stroke the night of 06/20/2024, but did not go to ER until 06/21/2024. He was noted to have L sided weakness along with decreased sensation. Pt. Did not receive tpa at hospital because it was too late. Patient reports he was in hospital for one day then released. He continues to have numbness and weakness on L side. Pt initially had a stroke effecting R side about 2-3 years ago that he was still dealing with including decreased balance and right sided weakness. He was using cane prior to this most recent stroke for ambulation, but is now using 3 wheeled walker. Patient reports he gets fatigued very easily with simple tasks. He also reports intermittent tremor in L leg.    Pt accompanied by: friend.   PERTINENT HISTORY: Patient experienced a right sided stroke on 06/20/2024 resulting in L hemiparesis and loss of sensation.  PAIN:  Are you having pain? No  PRECAUTIONS: Fall  RED FLAGS: None   WEIGHT BEARING RESTRICTIONS: No  FALLS: Has patient fallen in last 6 months? Yes. Number of falls 5x on 06/21/2024.   LIVING ENVIRONMENT: Lives with: lives with their family and lives alone Lives in: House/apartment Stairs: Yes: External: 2 steps; none Has following equipment at home: Single point cane, Environmental Consultant - 2 wheeled, Wheelchair (manual), shower chair, and Grab bars  PLOF: Independent  PATIENT GOALS: Improve LE strength, gait, balance, independence with mobility and  functional tasks at home, and yard work.   OBJECTIVE:  Note: Objective measures were completed at Evaluation unless otherwise noted.  DIAGNOSTIC FINDINGS: IMPRESSION: 1. Multiple small acute ischemic infarcts within the right hemisphere affecting parts of the right parietal cortex, right temporal cortex, and the right occipital lobe, with punctate foci of acute ischemia in the right frontal white matter. 2. Multifocal hyperintense T2-weighted signal within the cerebral white matter, most commonly due to chronic small vessel disease.    COGNITION: Overall cognitive status: Within functional limits for tasks assessed   SENSATION:L lower extremity.  Light touch: Impaired  Proprioception: Impaired   COORDINATION: Decreased speed and accuracy with toe tap.    MUSCLE TONE: LLE: Within functional limits   POSTURE: rounded shoulders  LOWER EXTREMITY ROM:     Active  Right Eval Left Eval  Hip flexion    Hip extension    Hip abduction    Hip adduction    Hip internal rotation    Hip external rotation    Knee flexion    Knee extension    Ankle dorsiflexion    Ankle plantarflexion    Ankle inversion    Ankle eversion     (Blank rows = not tested)  LOWER EXTREMITY MMT:    MMT Right Eval Left Eval  Hip flexion 4 4  Hip extension    Hip abduction 5 4-  Hip adduction    Hip internal rotation    Hip external rotation    Knee flexion 5 5  Knee extension 5 5  Ankle dorsiflexion 5 5  Ankle plantarflexion    Ankle inversion    Ankle eversion    (Blank rows = not tested)  BED MOBILITY:  Independent  TRANSFERS: Independent  RAMP:  Not tested  CURB:  Not tested  STAIRS: Not tested GAIT: Findings: Gait Characteristics: ataxic, Distance walked: 100', and Comments: Very slight ataxia with gait.  FUNCTIONAL TESTS:  5 times sit to stand: 23.47 Timed up and go (TUG): 13.4 without AD. 10 MWT:0.77 m/s without  AD. 6 minute walk test: Perform at initial  treatment. Berg Balance Scale:  Item Test date: 06/28/2024 Date:  Date:   Sitting to standing 4. able to stand without using hands and stabilize independently Insert SmartPhrase OPRCBERGREEVAL Insert SmartPhrase OPRCBERGREEVAL  2. Standing unsupported 4. able to stand safely for 2 minutes    3. Sitting with back unsupported, feet supported 4. able to sit safely and securely for 2 minutes    4. Standing to sitting 3. controls descent by using hands    5. Pivot transfer  4. able to transfer safely with minor use of hands    6. Standing unsupported with eyes closed 4. able to stand 10 seconds safely    7. Standing unsupported with feet together 4. able to place feet together independently and stand 1 minute safely    8. Reaching forward with outstretched arms while standing 4. can reach forward confidently 25 cm (10 inches)    9. Pick up object from the floor from standing 3. able to pick up slipper but needs supervision    10. Turning to look behind over left and right shoulders while standing 3. looks behind one side only, other side shows less weight shift    11. Turn 360 degrees 3. able to turn 360 degrees safely, one side only, in 4 seconds or less    12. Place alternate foot on step or stool while standing unsupported 4. ble to stand independently and safely and complete 8 steps in 20 seconds    13. Standing unsupported one foot in front 3. able to place foot ahead independently and hold 30 seconds    14. Standing on one leg 4. able to lift leg independently and hold > 10 seconds     Total Score 51/56 Total Score:    Total Score:      PATIENT SURVEYS:  ABC scale: The Activities-Specific Balance Confidence (ABC) Scale 0% 10 20 30  40 50 60 70 80 90 100% No confidence<->completely confident  How confident are you that you will not lose your balance or become unsteady when you . . .   Date tested 06/28/2024  Walk around the house 50%  2. Walk up or down stairs 0%  3. Bend over and pick  up a slipper from in front of a closet floor 30%  4. Reach for a small can off a shelf at eye level 30%  5. Stand on tip toes and reach for something above your head 30%  6. Stand on a chair and reach for something 0%  7. Sweep the floor 0%  8. Walk outside the house to a car parked in the driveway 40%  9. Get into or out of a car 50%  10. Walk across a parking lot to the mall 40%  11. Walk up or down a ramp 20%  12. Walk in a crowded mall where people rapidly walk past you 20%  13. Are bumped into by people as you walk through the mall 0%  14. Step onto or off of an escalator while you are holding onto the railing 50%  15. Step onto or off an escalator while holding onto parcels such that you cannot hold onto the railing 0%  16. Walk outside on icy sidewalks 0%  Total: #/16 22.5%  TREATMENT DATE: 07/10/2024   *Patient presented to clinic with QC.   NMR/TA Resisted gait with matrix cable machine  Forward 12.5# x 4  Side stepping 12.5# x 3 bil  Reverse 12.5x 5 bil  Cues from PT for improved weight shift from PT to prevent lateral LOB with side stepping and eccentric control following forward gait.   Step up to 6inch step with SLS on step x 10 bil. Light UE support intermittently in SLS. Assist for proper weight shift to allow SLS on step.   HEP expansion Forward step 6 inch step up x 10 bil  Lateral step up 6 inch step x 10 bil  Single limb step down x 10 bil with UE support due to quad weakness.     Unless otherwise stated, CGA was provided and gait belt donned in order to ensure pt safety   PATIENT EDUCATION: Education details: HEP, safety in home, stroke education. Person educated: Patient and patient's friend Education method: Explanation, Demonstration, Verbal cues, and Handouts Education comprehension: verbalized understanding and returned  demonstration  HOME EXERCISE PROGRAM: Access Code: [email or web address] URL: [email or web address] Date: 07/16/2024 Prepared by: Massie Dollar  Exercises - Sit to Stand with Arms Crossed  - 1 x daily - 7 x weekly - 2 sets - 10 reps - Standing Tandem Balance with Counter Support  - 1 x daily - 7 x weekly - 1 sets - 3 reps - 30 second hold - Single Leg Stance  - 1 x daily - 7 x weekly - 1 sets - 3 reps - 20 second hold - Lateral Step Up with Counter Support  - 1 x daily - 7 x weekly - 2 sets - 10 reps - Forward Step Up with Counter Support  - 1 x daily - 7 x weekly - 2 sets - 10 reps - Forward Step Down Touch with Heel  - 1 x daily - 7 x weekly - 3 sets - 10 reps - 2 hold  GOALS: Goals reviewed with patient? Yes  SHORT TERM GOALS: Target date: 07/12/2024  09/21/2023 Baseline: Patient will be independent with HEP by discharge allowing him to perform exercises at home with decreased difficulty and improved safety.  Goal status: INITIAL    LONG TERM GOALS: Target date: 09/20/2024  1.  Patient (> 55 years old) will complete five times sit to stand test in < 15 seconds indicating an increased LE strength and improved balance. Baseline: 23.47 without AD. Goal status: INITIAL  2.  Patient will increase ABC Scale by 40% by discharge allowing him to ambulate and perform functional activities with decreased risk of falling.  Baseline: 22.5%. Goal status: INITIAL   3.  Patient will reduce timed up and go to <11 seconds to reduce fall risk and demonstrate improved transfer/gait ability. Baseline: 13.4'' without Ad. Goal status: INITIAL  4.  Patient will increase 10 meter walk test to >1.19m/s as to improve gait speed for better community ambulation and to reduce fall risk. Baseline: 0.77 meters/second Goal status: INITIAL  5.  Patient will increase six minute walk test distance to >1000 for progression to community ambulator and improve gait ability Baseline: 800 ft Goal status:  INITIAL    ASSESSMENT:  CLINICAL IMPRESSION: Treatment focused on dynamic balance and functional movement patterns including stair management. HEP expanded due to progress and desire to utilize equipment at home. Guarded initially with unsteadiness but adapted well to all task quickly.  Pt asks questions throughout about form and demonstrates great willingness  to complete activities accurately, as well as possibility for use of multiple pieces of home gym equipment. Instructed in use of step, but to wait to use tread mill or elliptical until these can be assessby PT at subsequent sessions. Pt will continue to benefit from skilled physical therapy intervention to address impairments, improve QOL, and attain therapy goals.    OBJECTIVE IMPAIRMENTS: Abnormal gait, decreased activity tolerance, decreased balance, decreased coordination, decreased endurance, difficulty walking, decreased strength, impaired sensation, and impaired UE functional use.   ACTIVITY LIMITATIONS: carrying, lifting, bending, standing, squatting, stairs, transfers, and bed mobility  PARTICIPATION LIMITATIONS: meal prep, cleaning, laundry, driving, shopping, community activity, and yard work  PERSONAL FACTORS: 1-2 comorbidities: HTN, CHF, CAD, CVA are also affecting patient's functional outcome.   REHAB POTENTIAL: Good  CLINICAL DECISION MAKING: Stable/uncomplicated  EVALUATION COMPLEXITY: Moderate  PLAN:  PT FREQUENCY: 1-2x/week  PT DURATION: 12 weeks  PLANNED INTERVENTIONS: 97164- PT Re-evaluation, 97750- Physical Performance Testing, 97110-Therapeutic exercises, 97530- Therapeutic activity, [id number]- Neuromuscular re-education, 97535- Self Care, [id number]- Manual therapy, [phone]- Gait training, Patient/Family education, Balance training, Stair training, and Vestibular training  PLAN FOR NEXT SESSION:  - assess safety with use of treadmill or elliptical for home use.   -Continue working on confidence with gait- Using  least restrictive assistive device -Continue working to improve dynamic balance and LE coordination.     Massie FORBES Dollar PT Physical Therapist- [facility]        "

## 2024-07-18 ENCOUNTER — Ambulatory Visit: Admitting: Physical Therapy

## 2024-07-23 ENCOUNTER — Ambulatory Visit: Attending: Neurology | Admitting: Physical Therapy

## 2024-07-23 ENCOUNTER — Ambulatory Visit: Admitting: Neurology

## 2024-07-23 DIAGNOSIS — M6281 Muscle weakness (generalized): Secondary | ICD-10-CM | POA: Diagnosis present

## 2024-07-23 DIAGNOSIS — I639 Cerebral infarction, unspecified: Secondary | ICD-10-CM | POA: Insufficient documentation

## 2024-07-23 DIAGNOSIS — R2689 Other abnormalities of gait and mobility: Secondary | ICD-10-CM | POA: Diagnosis present

## 2024-07-23 DIAGNOSIS — R208 Other disturbances of skin sensation: Secondary | ICD-10-CM | POA: Insufficient documentation

## 2024-07-23 DIAGNOSIS — R278 Other lack of coordination: Secondary | ICD-10-CM | POA: Insufficient documentation

## 2024-07-23 DIAGNOSIS — R2681 Unsteadiness on feet: Secondary | ICD-10-CM | POA: Insufficient documentation

## 2024-07-23 NOTE — Therapy (Signed)
 " OUTPATIENT PHYSICAL THERAPY NEURO TREATMENT   Patient Name: [name] MRN: [id number] DOB:[birth year], 64 y.o., male Today's Date: 07/23/2024   PCP: Aletha Bene, MD   REFERRING PROVIDER: Rosemarie Eather RAMAN, MD   END OF SESSION:  PT End of Session - 07/23/24 1356     Visit Number 5    Number of Visits 25    Date for Recertification  09/20/24    Progress Note Due on Visit 10    PT Start Time 1356    PT Stop Time 1436    PT Time Calculation (min) 40 min    Equipment Utilized During Treatment Gait belt    Activity Tolerance Patient tolerated treatment well    Behavior During Therapy [facility] for tasks assessed/performed           Past Medical History:  Diagnosis Date   Anemia 10/03/2009   Qualifier: Diagnosis of  By: Georgian ROSALEA CHARM Lamar    Anxiety associated with depression 10/11/2013   pt states he is no longer having this issue because he is happy with his job now   Colon cancer [facility])    right colon cancer- adenocarcinoma CEA level isnrmal at 1.8   Coronary artery disease    Diabetes mellitus    Diabetes mellitus type 2 in obese 03/20/2010   Qualifier: Diagnosis of  By: Georgian ROSALEA CHARM Lamar     Erectile dysfunction 06/22/2016   Essential hypertension 06/05/2014   FATTY LIVER DISEASE 11/04/2009   Qualifier: Diagnosis of  By: Abran MD, Norleen SAILOR   Qualifier: Diagnosis of  By: Abran MD, Norleen SAILOR  Last Assessment & Plan:  conirmed by CT scan of abdomen in April 2016 encouraged to minimize simple carbs and fatty foods.   GERD 11/04/2009   Qualifier: Diagnosis of  By: Abran MD, Norleen SAILOR    Great toe pain, right 04/18/2017   Hepatic artery stenosis    History of colon cancer 01/02/2010   Qualifier: Diagnosis of  By: Georgian ROSALEA CHARM Lamar Dr Abran  Last Assessment & Plan:  Follows closely with gastroenterology, Dr Abran   Hyperlipidemia    Hyperlipidemia, mixed 09/07/2010   Qualifier: Diagnosis of  By: Georgian ROSALEA CHARM Lamar    Hypertension    Hypertriglyceridemia 12/14/2010    Hypotestosteronism 06/22/2016   INSOMNIA, CHRONIC 03/20/2010   Qualifier: Diagnosis of  By: Georgian ROSALEA CHARM Lamar Ambien  10 mg daily does not keep asleep AdvilPM is over sedating and has trouble waking up    Iron  deficiency anemia 2011   Low testosterone  06/22/2016   Malignant neoplasm of colon (HCC) 01/02/2010   Qualifier: Diagnosis of  By: Georgian ROSALEA CHARM Lamar Dr Abran    Muscle spasm of back 06/05/2014   Nipple pain 09/30/2016   Obesity    Palpitations 06/05/2014   Preventative health care 10/08/2012   Psoriasis    Psoriatic arthritis (HCC) 08/25/2017   Right knee pain 11/17/2011   Stroke [facility])    Past Surgical History:  Procedure Laterality Date   BUBBLE STUDY  10/21/2020   Procedure: BUBBLE STUDY;  Surgeon: Mona Vinie BROCKS, MD;  Location: [facility] ENDOSCOPY;  Service: Cardiovascular;;   CARPAL TUNNEL RELEASE     RIGHT   CHOLECYSTECTOMY  1994   COLONOSCOPY     CORONARY ARTERY BYPASS GRAFT N/A 09/19/2023   Procedure: CORONARY ARTERY BYPASS GRAFTING (CABG) TIMES TWO USING LEFT INTERNAL MAMMARY ARTERY AND ENDOSCOPICALLY HARVESTED RIGHT GREATER SAPHENOUS VEIN;  Surgeon: Lucas Dorise POUR, MD;  Location: [facility]  OR;  Service: Open Heart Surgery;  Laterality: N/A;   CORONARY/GRAFT ACUTE MI REVASCULARIZATION N/A 10/27/2019   Procedure: Coronary/Graft Acute MI Revascularization;  Surgeon: Darron Deatrice LABOR, MD;  Location: [facility] INVASIVE CV LAB;  Service: Cardiovascular;  Laterality: N/A;   HEMICOLECTOMY     12/17/2009 right   LEFT HEART CATH AND CORONARY ANGIOGRAPHY N/A 10/27/2019   Procedure: LEFT HEART CATH AND CORONARY ANGIOGRAPHY;  Surgeon: Darron Deatrice LABOR, MD;  Location: [facility] INVASIVE CV LAB;  Service: Cardiovascular;  Laterality: N/A;   LOOP RECORDER INSERTION N/A 10/21/2020   Procedure: LOOP RECORDER INSERTION;  Surgeon: Kelsie Agent, MD;  Location: [facility] INVASIVE CV LAB;  Service: Cardiovascular;  Laterality: N/A;   POLYPECTOMY     RIGHT/LEFT HEART CATH AND CORONARY ANGIOGRAPHY Bilateral 08/12/2023    Procedure: RIGHT/LEFT HEART CATH AND CORONARY ANGIOGRAPHY;  Surgeon: Darron Deatrice LABOR, MD;  Location: [facility] INVASIVE CV LAB;  Service: Cardiovascular;  Laterality: Bilateral;   TEE WITHOUT CARDIOVERSION N/A 10/21/2020   Procedure: TRANSESOPHAGEAL ECHOCARDIOGRAM (TEE);  Surgeon: Mona Vinie BROCKS, MD;  Location: [facility] ENDOSCOPY;  Service: Cardiovascular;  Laterality: N/A;   TEE WITHOUT CARDIOVERSION N/A 09/19/2023   Procedure: TRANSESOPHAGEAL ECHOCARDIOGRAM (TEE);  Surgeon: Lucas Dorise POUR, MD;  Location[facility] OR;  Service: Open Heart Surgery;  Laterality: N/A;   Patient Active Problem List   Diagnosis Date Noted   CVA (cerebral vascular accident) (HCC) 06/21/2024   S/P CABG x 2 09/22/2023   Coronary artery disease 09/19/2023   Annual physical exam 05/20/2023   Screen for colon cancer 05/20/2023   History of vitamin D  deficiency 05/20/2023   Acute reaction to situational stress 05/20/2023   Psychophysiological insomnia 03/10/2022   Hypertension associated with diabetes (HCC) 11/25/2021   Hyperlipidemia associated with type 2 diabetes mellitus (HCC) 11/25/2021   Diabetes mellitus (HCC) 11/25/2021   History of stroke 10/21/2020   Chronic systolic congestive heart failure (HCC) 11/30/2019   Coronary artery disease involving native coronary artery 11/30/2019   History of ST elevation myocardial infarction (STEMI) 10/27/2019   Psoriatic arthritis (HCC) 08/25/2017   History of colon cancer 01/02/2010   Esophageal reflux 11/04/2009    ONSET DATE: 06/21/2024  REFERRING DIAG:  I63.9 (ICD-10-CM) - Cryptogenic stroke (HCC)  R29.818 (ICD-10-CM) - Hemisensory deficit    THERAPY DIAG:  Muscle weakness (generalized)  Other lack of coordination  Cerebrovascular accident (CVA), unspecified mechanism (HCC)  Muscle weakness of lower extremity  Other abnormalities of gait and mobility  Unsteadiness on feet  Rationale for Evaluation and Treatment: Rehabilitation  SUBJECTIVE:   Pt reports that he  is tired on this day. States that he was unable to sleep well. States that has not used elliptical or treadmill at home yet per PT request.  States that he feels like the lack of sensation in the hand is his biggest problem.  SUBJECTIVE STATEMENT: Patient began to have symptoms of stroke the night of 06/20/2024, but did not go to ER until 06/21/2024. He was noted to have L sided weakness along with decreased sensation. Pt. Did not receive tpa at hospital because it was too late. Patient reports he was in hospital for one day then released. He continues to have numbness and weakness on L side. Pt initially had a stroke effecting R side about 2-3 years ago that he was still dealing with including decreased balance and right sided weakness. He was using cane prior to this most recent stroke for ambulation, but is now using 3 wheeled walker. Patient reports he gets fatigued very easily with simple tasks. He also reports intermittent tremor in L leg.    Pt accompanied by: friend.   PERTINENT HISTORY: Patient experienced a right sided stroke on 06/20/2024 resulting in L hemiparesis and loss of sensation.  PAIN:  Are you having pain? No  PRECAUTIONS: Fall  RED FLAGS: None   WEIGHT BEARING RESTRICTIONS: No  FALLS: Has patient fallen in last 6 months? Yes. Number of falls 5x on 06/21/2024.   LIVING ENVIRONMENT: Lives with: lives with their family and lives alone Lives in: House/apartment Stairs: Yes: External: 2 steps; none Has following equipment at home: Single point cane, Environmental Consultant - 2 wheeled, Wheelchair (manual), shower chair, and Grab bars  PLOF: Independent  PATIENT GOALS: Improve LE strength, gait, balance, independence with mobility and functional tasks at home, and yard work.   OBJECTIVE:   Note: Objective measures were completed at Evaluation unless otherwise noted.  DIAGNOSTIC FINDINGS: IMPRESSION: 1. Multiple small acute ischemic infarcts within the right hemisphere affecting parts of the right parietal cortex, right temporal cortex, and the right occipital lobe, with punctate foci of acute ischemia in the right frontal white matter. 2. Multifocal hyperintense T2-weighted signal within the cerebral white matter, most commonly due to chronic small vessel disease.    COGNITION: Overall cognitive status: Within functional limits for tasks assessed   SENSATION:L lower extremity.  Light touch: Impaired  Proprioception: Impaired   COORDINATION: Decreased speed and accuracy with toe tap.    MUSCLE TONE: LLE: Within functional limits   POSTURE: rounded shoulders  LOWER EXTREMITY ROM:     Active  Right Eval Left Eval  Hip flexion    Hip extension    Hip abduction    Hip adduction    Hip internal rotation    Hip external rotation    Knee flexion    Knee extension    Ankle dorsiflexion    Ankle plantarflexion    Ankle inversion    Ankle eversion     (Blank rows = not tested)  LOWER EXTREMITY MMT:    MMT Right Eval Left Eval  Hip flexion 4 4  Hip extension    Hip abduction 5 4-  Hip adduction    Hip internal rotation    Hip external rotation    Knee flexion 5 5  Knee extension 5 5  Ankle dorsiflexion 5 5  Ankle plantarflexion    Ankle inversion    Ankle eversion    (Blank rows = not tested)  BED MOBILITY:  Independent  TRANSFERS: Independent  RAMP:  Not tested  CURB:  Not tested  STAIRS: Not tested GAIT: Findings: Gait Characteristics: ataxic, Distance walked: 100', and Comments: Very slight ataxia with gait.  FUNCTIONAL TESTS:  5 times sit to stand: 23.47 Timed up and go (TUG): 13.4 without AD. 10 MWT:0.77 m/s without  AD. 6 minute walk test: Perform at initial treatment. Berg Balance Scale:  Item Test date: 06/28/2024  Date:  Date:   Sitting to standing 4. able to stand without using hands and stabilize independently Insert SmartPhrase OPRCBERGREEVAL Insert SmartPhrase OPRCBERGREEVAL  2. Standing unsupported 4. able to stand safely for 2 minutes    3. Sitting with back unsupported, feet supported 4. able to sit safely and securely for 2 minutes    4. Standing to sitting 3. controls descent by using hands    5. Pivot transfer  4. able to transfer safely with minor use of hands    6. Standing unsupported with eyes closed 4. able to stand 10 seconds safely    7. Standing unsupported with feet together 4. able to place feet together independently and stand 1 minute safely    8. Reaching forward with outstretched arms while standing 4. can reach forward confidently 25 cm (10 inches)    9. Pick up object from the floor from standing 3. able to pick up slipper but needs supervision    10. Turning to look behind over left and right shoulders while standing 3. looks behind one side only, other side shows less weight shift    11. Turn 360 degrees 3. able to turn 360 degrees safely, one side only, in 4 seconds or less    12. Place alternate foot on step or stool while standing unsupported 4. ble to stand independently and safely and complete 8 steps in 20 seconds    13. Standing unsupported one foot in front 3. able to place foot ahead independently and hold 30 seconds    14. Standing on one leg 4. able to lift leg independently and hold > 10 seconds     Total Score 51/56 Total Score:    Total Score:      PATIENT SURVEYS:  ABC scale: The Activities-Specific Balance Confidence (ABC) Scale 0% 10 20 30  40 50 60 70 80 90 100% No confidence<->completely confident  How confident are you that you will not lose your balance or become unsteady when you . . .   Date tested 06/28/2024  Walk around the house 50%  2. Walk up or down stairs 0%  3. Bend over and pick up a slipper from in front of a closet floor 30%  4. Reach  for a small can off a shelf at eye level 30%  5. Stand on tip toes and reach for something above your head 30%  6. Stand on a chair and reach for something 0%  7. Sweep the floor 0%  8. Walk outside the house to a car parked in the driveway 40%  9. Get into or out of a car 50%  10. Walk across a parking lot to the mall 40%  11. Walk up or down a ramp 20%  12. Walk in a crowded mall where people rapidly walk past you 20%  13. Are bumped into by people as you walk through the mall 0%  14. Step onto or off of an escalator while you are holding onto the railing 50%  15. Step onto or off an escalator while holding onto parcels such that you cannot hold onto the railing 0%  16. Walk outside on icy sidewalks 0%  Total: #/16 22.5%  TREATMENT DATE: 07/10/2024   *Patient presented to clinic without AD.   Self care for safe use at home:  Treadmill x 5 min 1.5-2.0 mph. Tactile cues to improve position on belt and maintain gait speed to prevent migration toward back of treadmill platform.   Elliptical x 5 min level 1-9. Cues for safety with mounting or dismount from Elliptical to reduce fall risk.   NMR  In parallel bars:  Standing on airex beam for AP control 2 x 45 sec. Posterior LOB initially, but then able to correct.  Side stepping in parallel bars on airex beam, x 5 bil.  Tandem stance on beam x 30 sec bil  Tandem gait on airex beam x 4 laps.   TA  Pulling baseball bat handle on matrix machine to step over 3 half bolsters and across perform x 5 with cues for step length on decreased speed on eccentric control  Unless otherwise stated, CGA was provided and gait belt donned in order to ensure pt safety   PATIENT EDUCATION: Education details: HEP, safety in home, stroke education. Benefits of NMR for stroke recovery.  Person educated: Patient and patient's  friend Education method: Explanation, Demonstration, Verbal cues, and Handouts Education comprehension: verbalized understanding and returned demonstration  HOME EXERCISE PROGRAM: Access Code: [email or web address] URL: [email or web address] Date: 07/16/2024 Prepared by: Massie Dollar  Exercises - Sit to Stand with Arms Crossed  - 1 x daily - 7 x weekly - 2 sets - 10 reps - Standing Tandem Balance with Counter Support  - 1 x daily - 7 x weekly - 1 sets - 3 reps - 30 second hold - Single Leg Stance  - 1 x daily - 7 x weekly - 1 sets - 3 reps - 20 second hold - Lateral Step Up with Counter Support  - 1 x daily - 7 x weekly - 2 sets - 10 reps - Forward Step Up with Counter Support  - 1 x daily - 7 x weekly - 2 sets - 10 reps - Forward Step Down Touch with Heel  - 1 x daily - 7 x weekly - 3 sets - 10 reps - 2 hold  GOALS: Goals reviewed with patient? Yes  SHORT TERM GOALS: Target date: 07/12/2024  09/21/2023 Baseline: Patient will be independent with HEP by discharge allowing him to perform exercises at home with decreased difficulty and improved safety.  Goal status: INITIAL    LONG TERM GOALS: Target date: 09/20/2024  1.  Patient (> 51 years old) will complete five times sit to stand test in < 15 seconds indicating an increased LE strength and improved balance. Baseline: 23.47 without AD. Goal status: INITIAL  2.  Patient will increase ABC Scale by 40% by discharge allowing him to ambulate and perform functional activities with decreased risk of falling.  Baseline: 22.5%. Goal status: INITIAL   3.  Patient will reduce timed up and go to <11 seconds to reduce fall risk and demonstrate improved transfer/gait ability. Baseline: 13.4'' without Ad. Goal status: INITIAL  4.  Patient will increase 10 meter walk test to >1.15m/s as to improve gait speed for better community ambulation and to reduce fall risk. Baseline: 0.77 meters/second Goal status: INITIAL  5.  Patient will  increase six minute walk test distance to >1000 for progression to community ambulator and improve gait ability Baseline: 800 ft Goal status: INITIAL    ASSESSMENT:  CLINICAL IMPRESSION: Treatment focused on dynamic balance and functional movement patterns including stair management. Education  and review of Home equipment use for safety following CVA. Extensive education for benefits of NMR on CVA recovery. Pt continues to state that he doesn't see the benefit despite education provided by PT. At this time Pt will continue to benefit from skilled physical therapy intervention to address impairments, improve QOL, and attain therapy goals.    OBJECTIVE IMPAIRMENTS: Abnormal gait, decreased activity tolerance, decreased balance, decreased coordination, decreased endurance, difficulty walking, decreased strength, impaired sensation, and impaired UE functional use.   ACTIVITY LIMITATIONS: carrying, lifting, bending, standing, squatting, stairs, transfers, and bed mobility  PARTICIPATION LIMITATIONS: meal prep, cleaning, laundry, driving, shopping, community activity, and yard work  PERSONAL FACTORS: 1-2 comorbidities: HTN, CHF, CAD, CVA are also affecting patient's functional outcome.   REHAB POTENTIAL: Good  CLINICAL DECISION MAKING: Stable/uncomplicated  EVALUATION COMPLEXITY: Moderate  PLAN:  PT FREQUENCY: 1-2x/week  PT DURATION: 12 weeks  PLANNED INTERVENTIONS: [id number]- PT Re-evaluation, 97750- Physical Performance Testing, 97110-Therapeutic exercises, 97530- Therapeutic activity, [id number]- Neuromuscular re-education, 97535- Self Care, [id number]- Manual therapy, [phone]- Gait training, Patient/Family education, Balance training, Stair training, and Vestibular training  PLAN FOR NEXT SESSION:  - dynamic balance and functional movement training.    Massie FORBES Dollar PT Physical Therapist- [place]  [facility]        "

## 2024-07-24 ENCOUNTER — Encounter: Payer: Self-pay | Admitting: Family Medicine

## 2024-07-24 ENCOUNTER — Ambulatory Visit: Admitting: Family Medicine

## 2024-07-24 VITALS — BP 118/78 | HR 86 | Ht 68.0 in | Wt 185.4 lb

## 2024-07-24 DIAGNOSIS — E785 Hyperlipidemia, unspecified: Secondary | ICD-10-CM | POA: Diagnosis not present

## 2024-07-24 DIAGNOSIS — E1169 Type 2 diabetes mellitus with other specified complication: Secondary | ICD-10-CM

## 2024-07-24 DIAGNOSIS — I152 Hypertension secondary to endocrine disorders: Secondary | ICD-10-CM | POA: Diagnosis not present

## 2024-07-24 DIAGNOSIS — E1159 Type 2 diabetes mellitus with other circulatory complications: Secondary | ICD-10-CM | POA: Diagnosis not present

## 2024-07-24 DIAGNOSIS — E1165 Type 2 diabetes mellitus with hyperglycemia: Secondary | ICD-10-CM

## 2024-07-24 DIAGNOSIS — F419 Anxiety disorder, unspecified: Secondary | ICD-10-CM | POA: Diagnosis not present

## 2024-07-24 DIAGNOSIS — G4700 Insomnia, unspecified: Secondary | ICD-10-CM

## 2024-07-24 DIAGNOSIS — I639 Cerebral infarction, unspecified: Secondary | ICD-10-CM | POA: Diagnosis not present

## 2024-07-24 NOTE — Progress Notes (Addendum)
 "  Patient Office Visit  Assessment & Plan:  Hypertension associated with diabetes (HCC)  Hyperlipidemia associated with type 2 diabetes mellitus (HCC)  Type 2 diabetes mellitus with hyperglycemia, without long-term current use of insulin  (HCC)  Insomnia, unspecified type  Cerebrovascular accident (CVA), unspecified mechanism (HCC)  Anxiety   Assessment and Plan           Cerebrovascular accident Recent stroke with residual left-sided weakness. Concerns about medication management and conflicting advice from specialists. Motivated to prevent further strokes. - Continue Brilinta  for 21 days as per hospital protocol. - Continue aspirin  indefinitely. - Continue rosuvastatin  20 mg daily. - Follow up with cardiologist in February. - Provided form for handicap placard to [facility].  Hyperlipidemia associated with type 2 diabetes mellitus LDL cholesterol at 96 mg/dL, target less than 70 mg/dL due to stroke history. Compliant with rosuvastatin . - Continue rosuvastatin  20 mg daily. - Recheck cholesterol levels next month per cardiology  Insomnia Persistent insomnia possibly related to stroke and anxiety. Seroquel  now perceived as helpful. Uncertainty about Remeron 's effectiveness. - Continue Seroquel  as needed for sleep. - Consider Remeron  for sleep if Seroquel  is ineffective.  Anxiety disorder Increased anxiety post-stroke. Buspar  7.5 mg twice daily prescribed but perceived as ineffective. Motivated to manage anxiety to prevent further strokes. - patient declines medication at this time      Sent a note to his cardiologist regarding the Brilinta  duration and will let patient know what the cardiologist recommends.  Continue cholesterol medication, continue aspirin .  Is unclear whether he will be taking Seroquel  or Remeron  for insomnia.  Patient will not take the BuSpar  anymore.  Return in 6 months or sooner if necessary. Addendum- Darron Deatrice LABOR, MD  Aletha Bene, MD No  indication for Brilinta  from a cardiac standpoint.  He was on it after he had his cardiac stent but it was stopped when he had CABG.  It seems that he was given Brilinta  by neurology for 21 days.  [name] Darron  Return in about 6 months (around 01/21/2025), or if symptoms worsen or fail to improve.   Subjective:    Patient ID: [name], male    DOB: [birth year]  Age: 64 y.o. MRN: [id number]  Chief Complaint  Patient presents with   Medical Management of Chronic Issues    Here for 3 month f/u.     HPI Discussed the use of AI scribe software for clinical note transcription with the patient, who gave verbal consent to proceed.  History of Present Illness       History of Present Illness [name] is a 64 year old male with a history of stroke who presents for follow-up after a recent stroke that occurred last month at [facility] Also here to discuss chronic medical issues/medications. Patient needs handicap placard  He experienced another stroke on December 4th and was hospitalized. He feels the care was inadequate compared to previous experiences at a different hospital [facility] and was discharged without clear instructions or communication about his condition.  He is experiencing significant anxiety, which he attributes to the recent 2nd stroke. He has been prescribed Buspar  7.5 mg twice a day for anxiety but finds it ineffective. He feels more worried since the second stroke but anxiety improved  compared to last time  He is dealing with insomnia. Initially, Seroquel  was prescribed for sleep, which was ineffective at first, but he reports some improvement now. Remeron  was also prescribed for sleep, but it is unclear if he  has been using it consistently. Patient is not taking it right now  He reports that he has no feeling at all in his left hand. He recounts an incident where hot water in the shower caused a reflexive reaction, indicating some sensation is present.  He is concerned about mobility and safety, particularly regarding the risk of falling. He uses a cane for support but is trying not to rely on it. He has had physical therapy sessions but reports that therapists have not explicitly advised him to use the cane consistently.  CAD- s/p stent and then CABG March 2025- Regarding his medication regimen, he is taking rosuvastatin  20 mg daily for cholesterol management. He was under the impression to stop it from last office visit (which was not stated) but has continued taking it. He also takes aspirin  and Brilinta , although there is confusion about the duration of Brilinta  use. He mentions conflicting advice from different doctors regarding his medications. He states that his cardiologist told him to take Brilinta  the rest of his life but then the neurologist told him to take it for 21 days and then resume aspirin .  Patient is very frustrated with conflicting information.  Patient does have an upcoming appointment with his cardiologist next month  He has a history of psoriatic arthritis, which he states has improved, and he is not currently taking medication for it. He is concerned about the possibility of a third stroke and the impact it would have on his life.  Results Labs LDL: LDL 96  Assessment and Plan Cerebrovascular accident Recent stroke with residual left-sided weakness. Concerns about medication management and conflicting advice from specialists. Motivated to prevent further strokes. - per patient he is still taking Brilinta  - Continue aspirin  indefinitely. - Continue rosuvastatin  20 mg daily. - Follow up with cardiologist in February. - Provided form for handicap placard to [facility].  CAD/Hyperlipidemia associated with type 2 diabetes mellitus LDL cholesterol at 96 mg/dL, target less than 70 mg/dL due to stroke and CAD history. Compliant with rosuvastatin . - Continue rosuvastatin  20 mg daily. - Recheck cholesterol levels when he sees  cardiologist  Insomnia Persistent insomnia possibly related to stroke and anxiety. Seroquel  now perceived as helpful. Uncertainty about Remeron 's effectiveness. - Continue Seroquel  as needed for sleep. - Consider Remeron  for sleep if Seroquel  is ineffective.  Anxiety disorder Increased anxiety post-stroke. Buspar  7.5 mg twice daily prescribed but perceived as ineffective. Motivated to manage anxiety to prevent further strokes. - Continue Buspar  7.5 mg twice daily for anxiety.    The ASCVD Risk score (Arnett DK, et al., 2019) failed to calculate for the following reasons:   Risk score cannot be calculated because patient has a medical history suggesting prior/existing ASCVD   * - Cholesterol units were assumed  Past Medical History:  Diagnosis Date   Anemia 10/03/2009   Qualifier: Diagnosis of  By: Georgian ROSALEA CHARM Lamar    Anxiety associated with depression 10/11/2013   pt states he is no longer having this issue because he is happy with his job now   Colon cancer [facility])    right colon cancer- adenocarcinoma CEA level isnrmal at 1.8   Coronary artery disease    Diabetes mellitus    Diabetes mellitus type 2 in obese 03/20/2010   Qualifier: Diagnosis of  By: Georgian ROSALEA CHARM Lamar     Erectile dysfunction 06/22/2016   Essential hypertension 06/05/2014   FATTY LIVER DISEASE 11/04/2009   Qualifier: Diagnosis of  By: Abran MD, Norleen SAILOR  Qualifier: Diagnosis of  By: Abran MD, Norleen SAILOR  Last Assessment & Plan:  conirmed by CT scan of abdomen in April 2016 encouraged to minimize simple carbs and fatty foods.   GERD 11/04/2009   Qualifier: Diagnosis of  By: Abran MD, Norleen SAILOR    Great toe pain, right 04/18/2017   Hepatic artery stenosis    History of colon cancer 01/02/2010   Qualifier: Diagnosis of  By: Georgian ROSALEA CHARM Lamar Dr Abran  Last Assessment & Plan:  Follows closely with gastroenterology, Dr Abran   Hyperlipidemia    Hyperlipidemia, mixed 09/07/2010   Qualifier: Diagnosis of  By: Georgian ROSALEA CHARM Lamar    Hypertension    Hypertriglyceridemia 12/14/2010   Hypotestosteronism 06/22/2016   INSOMNIA, CHRONIC 03/20/2010   Qualifier: Diagnosis of  By: Georgian ROSALEA CHARM Lamar Ambien  10 mg daily does not keep asleep AdvilPM is over sedating and has trouble waking up    Iron  deficiency anemia 2011   Low testosterone  06/22/2016   Malignant neoplasm of colon (HCC) 01/02/2010   Qualifier: Diagnosis of  By: Georgian ROSALEA CHARM Lamar Dr Abran    Muscle spasm of back 06/05/2014   Nipple pain 09/30/2016   Obesity    Palpitations 06/05/2014   Preventative health care 10/08/2012   Psoriasis    Psoriatic arthritis (HCC) 08/25/2017   Right knee pain 11/17/2011   Stroke [facility])    Past Surgical History:  Procedure Laterality Date   BUBBLE STUDY  10/21/2020   Procedure: BUBBLE STUDY;  Surgeon: Mona Vinie BROCKS, MD;  Location: [facility] ENDOSCOPY;  Service: Cardiovascular;;   CARPAL TUNNEL RELEASE     RIGHT   CHOLECYSTECTOMY  1994   COLONOSCOPY     CORONARY ARTERY BYPASS GRAFT N/A 09/19/2023   Procedure: CORONARY ARTERY BYPASS GRAFTING (CABG) TIMES TWO USING LEFT INTERNAL MAMMARY ARTERY AND ENDOSCOPICALLY HARVESTED RIGHT GREATER SAPHENOUS VEIN;  Surgeon: Lucas Dorise POUR, MD;  Location: [facility] OR;  Service: Open Heart Surgery;  Laterality: N/A;   CORONARY/GRAFT ACUTE MI REVASCULARIZATION N/A 10/27/2019   Procedure: Coronary/Graft Acute MI Revascularization;  Surgeon: Darron Deatrice LABOR, MD;  Location: [facility] INVASIVE CV LAB;  Service: Cardiovascular;  Laterality: N/A;   HEMICOLECTOMY     12/17/2009 right   LEFT HEART CATH AND CORONARY ANGIOGRAPHY N/A 10/27/2019   Procedure: LEFT HEART CATH AND CORONARY ANGIOGRAPHY;  Surgeon: Darron Deatrice LABOR, MD;  Location: [facility] INVASIVE CV LAB;  Service: Cardiovascular;  Laterality: N/A;   LOOP RECORDER INSERTION N/A 10/21/2020   Procedure: LOOP RECORDER INSERTION;  Surgeon: Kelsie Agent, MD;  Location: [facility] INVASIVE CV LAB;  Service: Cardiovascular;  Laterality: N/A;   POLYPECTOMY     RIGHT/LEFT  HEART CATH AND CORONARY ANGIOGRAPHY Bilateral 08/12/2023   Procedure: RIGHT/LEFT HEART CATH AND CORONARY ANGIOGRAPHY;  Surgeon: Darron Deatrice LABOR, MD;  Location: [facility] INVASIVE CV LAB;  Service: Cardiovascular;  Laterality: Bilateral;   TEE WITHOUT CARDIOVERSION N/A 10/21/2020   Procedure: TRANSESOPHAGEAL ECHOCARDIOGRAM (TEE);  Surgeon: Mona Vinie BROCKS, MD;  Location: [facility] ENDOSCOPY;  Service: Cardiovascular;  Laterality: N/A;   TEE WITHOUT CARDIOVERSION N/A 09/19/2023   Procedure: TRANSESOPHAGEAL ECHOCARDIOGRAM (TEE);  Surgeon: Lucas Dorise POUR, MD;  Location: [facility] OR;  Service: Open Heart Surgery;  Laterality: N/A;   Social History[1] Family History  Problem Relation Age of Onset   Stomach cancer Mother        diedinher 70's   Diabetes type II Brother        boderline   Diabetes Brother  type II   Alcohol abuse Brother    Other Neg Hx        cad,prostate ca, colon ca   Coronary artery disease Neg Hx    Cancer Neg Hx        colon, prostate   Colon cancer Neg Hx    Esophageal cancer Neg Hx    Rectal cancer Neg Hx    Allergies[2]  ROS    Objective:    BP 118/78   Pulse 86   Ht 5' 8 (1.727 m)   Wt 185 lb 6 oz (84.1 kg)   SpO2 99%   BMI 28.19 kg/m  BP Readings from Last 3 Encounters:  07/24/24 118/78  06/26/24 130/70  06/25/24 122/73   Wt Readings from Last 3 Encounters:  07/24/24 185 lb 6 oz (84.1 kg)  06/26/24 180 lb (81.6 kg)  06/25/24 179 lb 6.4 oz (81.4 kg)    Physical Exam Vitals and nursing note reviewed.  Constitutional:      General: He is not in acute distress.    Appearance: Normal appearance.     Comments: Comes alone today, does not bring cane today  HENT:     Head: Normocephalic.     Right Ear: Tympanic membrane, ear canal and external ear normal.     Left Ear: Tympanic membrane, ear canal and external ear normal.  Eyes:     Extraocular Movements: Extraocular movements intact.  Cardiovascular:     Rate and Rhythm: Normal rate and regular rhythm.      Heart sounds: Normal heart sounds.  Pulmonary:     Effort: Pulmonary effort is normal.     Breath sounds: Normal breath sounds.  Musculoskeletal:     Right lower leg: No edema.     Left lower leg: No edema.  Neurological:     General: No focal deficit present.     Mental Status: He is alert and oriented to person, place, and time.  Psychiatric:        Attention and Perception: Attention normal.        Mood and Affect: Mood is anxious.        Behavior: Behavior normal.        Thought Content: Thought content normal.     Comments: Patient does not appear as anxious as last office visit.       No results found for any visits on 07/24/24.           [1]  Social History Tobacco Use   Smoking status: Former    Current packs/day: 0.00    Types: Cigarettes    Quit date: 07/16/2023    Years since quitting: 1.0   Smokeless tobacco: Never   Tobacco comments:    Smokes 1 Pack per week; started back 3 months ago  Vaping Use   Vaping status: Never Used  Substance Use Topics   Alcohol use: No   Drug use: No  [2]  Allergies Allergen Reactions   Clopidogrel  Anaphylaxis    Weird sensation in legs    "

## 2024-07-31 ENCOUNTER — Ambulatory Visit

## 2024-07-31 ENCOUNTER — Other Ambulatory Visit: Payer: Self-pay | Admitting: Family Medicine

## 2024-07-31 ENCOUNTER — Ambulatory Visit: Admitting: Occupational Therapy

## 2024-07-31 ENCOUNTER — Ambulatory Visit: Admitting: Physical Therapy

## 2024-07-31 DIAGNOSIS — I639 Cerebral infarction, unspecified: Secondary | ICD-10-CM

## 2024-07-31 DIAGNOSIS — M6281 Muscle weakness (generalized): Secondary | ICD-10-CM

## 2024-07-31 DIAGNOSIS — R2689 Other abnormalities of gait and mobility: Secondary | ICD-10-CM

## 2024-07-31 DIAGNOSIS — R208 Other disturbances of skin sensation: Secondary | ICD-10-CM

## 2024-07-31 DIAGNOSIS — R2681 Unsteadiness on feet: Secondary | ICD-10-CM

## 2024-07-31 LAB — CUP PACEART REMOTE DEVICE CHECK
Date Time Interrogation Session: 20260112230652
Implantable Pulse Generator Implant Date: 20220405

## 2024-07-31 NOTE — Therapy (Signed)
 " OUTPATIENT PHYSICAL THERAPY NEURO TREATMENT   Patient Name: [name] MRN: [id number] DOB:1960-10-10, 64 y.o., male Today's Date: 07/31/2024   PCP: Aletha Bene, MD   REFERRING PROVIDER: Rosemarie Eather RAMAN, MD   END OF SESSION:  PT End of Session - 07/31/24 1158     Visit Number 6    Number of Visits 25    Date for Recertification  09/20/24    Progress Note Due on Visit 10    PT Start Time 1149    PT Stop Time 1227    PT Time Calculation (min) 38 min    Equipment Utilized During Treatment Gait belt    Activity Tolerance Patient tolerated treatment well    Behavior During Therapy [facility] for tasks assessed/performed            Past Medical History:  Diagnosis Date   Anemia 10/03/2009   Qualifier: Diagnosis of  By: Georgian ROSALEA CHARM Lamar    Anxiety associated with depression 10/11/2013   pt states he is no longer having this issue because he is happy with his job now   Colon cancer [facility])    right colon cancer- adenocarcinoma CEA level isnrmal at 1.8   Coronary artery disease    Diabetes mellitus    Diabetes mellitus type 2 in obese 03/20/2010   Qualifier: Diagnosis of  By: Georgian ROSALEA CHARM Lamar     Erectile dysfunction 06/22/2016   Essential hypertension 06/05/2014   FATTY LIVER DISEASE 11/04/2009   Qualifier: Diagnosis of  By: Abran MD, Norleen SAILOR   Qualifier: Diagnosis of  By: Abran MD, Norleen SAILOR  Last Assessment & Plan:  conirmed by CT scan of abdomen in April 2016 encouraged to minimize simple carbs and fatty foods.   GERD 11/04/2009   Qualifier: Diagnosis of  By: Abran MD, Norleen SAILOR    Great toe pain, right 04/18/2017   Hepatic artery stenosis    History of colon cancer 01/02/2010   Qualifier: Diagnosis of  By: Georgian ROSALEA CHARM Lamar Dr Abran  Last Assessment & Plan:  Follows closely with gastroenterology, Dr Abran   Hyperlipidemia    Hyperlipidemia, mixed 09/07/2010   Qualifier: Diagnosis of  By: Georgian ROSALEA CHARM Lamar    Hypertension    Hypertriglyceridemia 12/14/2010    Hypotestosteronism 06/22/2016   INSOMNIA, CHRONIC 03/20/2010   Qualifier: Diagnosis of  By: Georgian ROSALEA CHARM Lamar Ambien  10 mg daily does not keep asleep AdvilPM is over sedating and has trouble waking up    Iron  deficiency anemia 2011   Low testosterone  06/22/2016   Malignant neoplasm of colon (HCC) 01/02/2010   Qualifier: Diagnosis of  By: Georgian ROSALEA CHARM Lamar Dr Abran    Muscle spasm of back 06/05/2014   Nipple pain 09/30/2016   Obesity    Palpitations 06/05/2014   Preventative health care 10/08/2012   Psoriasis    Psoriatic arthritis (HCC) 08/25/2017   Right knee pain 11/17/2011   Stroke [facility])    Past Surgical History:  Procedure Laterality Date   BUBBLE STUDY  10/21/2020   Procedure: BUBBLE STUDY;  Surgeon: Mona Vinie BROCKS, MD;  Location: [facility] ENDOSCOPY;  Service: Cardiovascular;;   CARPAL TUNNEL RELEASE     RIGHT   CHOLECYSTECTOMY  1994   COLONOSCOPY     CORONARY ARTERY BYPASS GRAFT N/A 09/19/2023   Procedure: CORONARY ARTERY BYPASS GRAFTING (CABG) TIMES TWO USING LEFT INTERNAL MAMMARY ARTERY AND ENDOSCOPICALLY HARVESTED RIGHT GREATER SAPHENOUS VEIN;  Surgeon: Lucas Dorise POUR, MD;  Location:  [facility] OR;  Service: Open Heart Surgery;  Laterality: N/A;   CORONARY/GRAFT ACUTE MI REVASCULARIZATION N/A 10/27/2019   Procedure: Coronary/Graft Acute MI Revascularization;  Surgeon: Darron Deatrice LABOR, MD;  Location: [facility] INVASIVE CV LAB;  Service: Cardiovascular;  Laterality: N/A;   HEMICOLECTOMY     12/17/2009 right   LEFT HEART CATH AND CORONARY ANGIOGRAPHY N/A 10/27/2019   Procedure: LEFT HEART CATH AND CORONARY ANGIOGRAPHY;  Surgeon: Darron Deatrice LABOR, MD;  Location: [facility] INVASIVE CV LAB;  Service: Cardiovascular;  Laterality: N/A;   LOOP RECORDER INSERTION N/A 10/21/2020   Procedure: LOOP RECORDER INSERTION;  Surgeon: Kelsie Agent, MD;  Location: [facility] INVASIVE CV LAB;  Service: Cardiovascular;  Laterality: N/A;   POLYPECTOMY     RIGHT/LEFT HEART CATH AND CORONARY ANGIOGRAPHY Bilateral 08/12/2023    Procedure: RIGHT/LEFT HEART CATH AND CORONARY ANGIOGRAPHY;  Surgeon: Darron Deatrice LABOR, MD;  Location: [facility] INVASIVE CV LAB;  Service: Cardiovascular;  Laterality: Bilateral;   TEE WITHOUT CARDIOVERSION N/A 10/21/2020   Procedure: TRANSESOPHAGEAL ECHOCARDIOGRAM (TEE);  Surgeon: Mona Vinie BROCKS, MD;  Location: [facility] ENDOSCOPY;  Service: Cardiovascular;  Laterality: N/A;   TEE WITHOUT CARDIOVERSION N/A 09/19/2023   Procedure: TRANSESOPHAGEAL ECHOCARDIOGRAM (TEE);  Surgeon: Lucas Dorise POUR, MD;  Location: [facility] OR;  Service: Open Heart Surgery;  Laterality: N/A;   Patient Active Problem List   Diagnosis Date Noted   CVA (cerebral vascular accident) (HCC) 06/21/2024   S/P CABG x 2 09/22/2023   Coronary artery disease 09/19/2023   Annual physical exam 05/20/2023   Screen for colon cancer 05/20/2023   History of vitamin D  deficiency 05/20/2023   Acute reaction to situational stress 05/20/2023   Psychophysiological insomnia 03/10/2022   Hypertension associated with diabetes (HCC) 11/25/2021   Hyperlipidemia associated with type 2 diabetes mellitus (HCC) 11/25/2021   Diabetes mellitus (HCC) 11/25/2021   History of stroke 10/21/2020   Chronic systolic congestive heart failure (HCC) 11/30/2019   Coronary artery disease involving native coronary artery 11/30/2019   History of ST elevation myocardial infarction (STEMI) 10/27/2019   Psoriatic arthritis (HCC) 08/25/2017   History of colon cancer 01/02/2010   Esophageal reflux 11/04/2009    ONSET DATE: 06/21/2024  REFERRING DIAG:  I63.9 (ICD-10-CM) - Cryptogenic stroke (HCC)  R29.818 (ICD-10-CM) - Hemisensory deficit    THERAPY DIAG:  Muscle weakness of lower extremity  Other abnormalities of gait and mobility  Unsteadiness on feet  Muscle weakness (generalized)  Rationale for Evaluation and Treatment: Rehabilitation  SUBJECTIVE:   Pt reports no changes since last session. He reports he is concerned regarding the grip strength with activities  in session due to lotion on his hands making it slick. Lotion from OT treatment session prior.  SUBJECTIVE STATEMENT: Patient began to have symptoms of stroke the night of 06/20/2024, but did not go to ER until 06/21/2024. He was noted to have L sided weakness along with decreased sensation. Pt. Did not receive tpa at hospital because it was too late. Patient reports he was in hospital for one day then released. He continues to have numbness and weakness on L side. Pt initially had a stroke effecting R side about 2-3 years ago that he was still dealing with including decreased balance and right sided weakness. He was using cane prior to this most recent stroke for ambulation, but is now using 3 wheeled walker. Patient reports he gets fatigued very easily with simple tasks. He also reports intermittent tremor in L leg.    Pt accompanied by: friend.   PERTINENT HISTORY: Patient experienced a right sided stroke on 06/20/2024 resulting in L hemiparesis and loss of sensation.  PAIN:  Are you having pain? No  PRECAUTIONS: Fall  RED FLAGS: None   WEIGHT BEARING RESTRICTIONS: No  FALLS: Has patient fallen in last 6 months? Yes. Number of falls 5x on 06/21/2024.   LIVING ENVIRONMENT: Lives with: lives with their family and lives alone Lives in: House/apartment Stairs: Yes: External: 2 steps; none Has following equipment at home: Single point cane, Environmental Consultant - 2 wheeled, Wheelchair (manual), shower chair, and Grab bars  PLOF: Independent  PATIENT GOALS: Improve LE strength, gait, balance, independence with mobility and functional tasks at home, and yard work.   OBJECTIVE:  Note: Objective measures were completed at Evaluation unless otherwise noted.  DIAGNOSTIC FINDINGS: IMPRESSION: 1. Multiple small  acute ischemic infarcts within the right hemisphere affecting parts of the right parietal cortex, right temporal cortex, and the right occipital lobe, with punctate foci of acute ischemia in the right frontal white matter. 2. Multifocal hyperintense T2-weighted signal within the cerebral white matter, most commonly due to chronic small vessel disease.    COGNITION: Overall cognitive status: Within functional limits for tasks assessed   SENSATION:L lower extremity.  Light touch: Impaired  Proprioception: Impaired   COORDINATION: Decreased speed and accuracy with toe tap.    MUSCLE TONE: LLE: Within functional limits   POSTURE: rounded shoulders  LOWER EXTREMITY ROM:     Active  Right Eval Left Eval  Hip flexion    Hip extension    Hip abduction    Hip adduction    Hip internal rotation    Hip external rotation    Knee flexion    Knee extension    Ankle dorsiflexion    Ankle plantarflexion    Ankle inversion    Ankle eversion     (Blank rows = not tested)  LOWER EXTREMITY MMT:    MMT Right Eval Left Eval  Hip flexion 4 4  Hip extension    Hip abduction 5 4-  Hip adduction    Hip internal rotation    Hip external rotation    Knee flexion 5 5  Knee extension 5 5  Ankle dorsiflexion 5 5  Ankle plantarflexion    Ankle inversion    Ankle eversion    (Blank rows = not tested)  BED MOBILITY:  Independent  TRANSFERS: Independent  RAMP:  Not tested  CURB:  Not tested  STAIRS: Not tested GAIT: Findings: Gait Characteristics: ataxic, Distance walked: 100', and Comments: Very slight ataxia with gait.  FUNCTIONAL TESTS:  5 times sit to stand: 23.47 Timed up and go (TUG): 13.4 without AD. 10 MWT:0.77 m/s without  AD. 6 minute walk test: Perform at initial treatment. Berg Balance Scale:  Item Test date: 06/28/2024 Date:  Date:   Sitting to standing 4. able to stand without using hands and stabilize independently Insert SmartPhrase OPRCBERGREEVAL  Insert SmartPhrase OPRCBERGREEVAL  2. Standing unsupported 4. able to stand safely for 2 minutes    3. Sitting with back unsupported, feet supported 4. able to sit safely and securely for 2 minutes    4. Standing to sitting 3. controls descent by using hands    5. Pivot transfer  4. able to transfer safely with minor use of hands    6. Standing unsupported with eyes closed 4. able to stand 10 seconds safely    7. Standing unsupported with feet together 4. able to place feet together independently and stand 1 minute safely    8. Reaching forward with outstretched arms while standing 4. can reach forward confidently 25 cm (10 inches)    9. Pick up object from the floor from standing 3. able to pick up slipper but needs supervision    10. Turning to look behind over left and right shoulders while standing 3. looks behind one side only, other side shows less weight shift    11. Turn 360 degrees 3. able to turn 360 degrees safely, one side only, in 4 seconds or less    12. Place alternate foot on step or stool while standing unsupported 4. ble to stand independently and safely and complete 8 steps in 20 seconds    13. Standing unsupported one foot in front 3. able to place foot ahead independently and hold 30 seconds    14. Standing on one leg 4. able to lift leg independently and hold > 10 seconds     Total Score 51/56 Total Score:    Total Score:      PATIENT SURVEYS:  ABC scale: The Activities-Specific Balance Confidence (ABC) Scale 0% 10 20 30  40 50 60 70 80 90 100% No confidence<->completely confident  How confident are you that you will not lose your balance or become unsteady when you . . .   Date tested 06/28/2024  Walk around the house 50%  2. Walk up or down stairs 0%  3. Bend over and pick up a slipper from in front of a closet floor 30%  4. Reach for a small can off a shelf at eye level 30%  5. Stand on tip toes and reach for something above your head 30%  6. Stand on a chair and  reach for something 0%  7. Sweep the floor 0%  8. Walk outside the house to a car parked in the driveway 40%  9. Get into or out of a car 50%  10. Walk across a parking lot to the mall 40%  11. Walk up or down a ramp 20%  12. Walk in a crowded mall where people rapidly walk past you 20%  13. Are bumped into by people as you walk through the mall 0%  14. Step onto or off of an escalator while you are holding onto the railing 50%  15. Step onto or off an escalator while holding onto parcels such that you cannot hold onto the railing 0%  16. Walk outside on icy sidewalks 0%  Total: #/16 22.5%  TREATMENT DATE: 07/10/2024   *Patient presented to clinic without AD.   NMR: To facilitate reeducation of movement, balance, posture, coordination, and/or proprioception/kinesthetic sense.  Functional pick up and carry around obstacles and up and down steps x 5 laps objects varied from 1KG ball to 4 KG ball   Forward and retro resisted gait x 4 laps anterior with 12.5 #  - gradual increased resistance with retro then forward gait from 12.5# to 22.5# for more challenging intervention  Balance obstacel course - gentle cone taps, airex beam, bosu, airex, 4 in step x 4 laps  - added more difficulty by combining airex, step and hurdle x another 4 laps and added additional BOSU ball.   Unless otherwise stated, CGA was provided and gait belt donned in order to ensure pt safety   PATIENT EDUCATION: Education details: HEP, safety in home, stroke education. Benefits of NMR for stroke recovery.  Person educated: Patient and patient's friend Education method: Explanation, Demonstration, Verbal cues, and Handouts Education comprehension: verbalized understanding and returned demonstration  HOME EXERCISE PROGRAM: Access Code: [email or web address] URL:  [email or web address] Date: 07/16/2024 Prepared by: Massie Dollar  Exercises - Sit to Stand with Arms Crossed  - 1 x daily - 7 x weekly - 2 sets - 10 reps - Standing Tandem Balance with Counter Support  - 1 x daily - 7 x weekly - 1 sets - 3 reps - 30 second hold - Single Leg Stance  - 1 x daily - 7 x weekly - 1 sets - 3 reps - 20 second hold - Lateral Step Up with Counter Support  - 1 x daily - 7 x weekly - 2 sets - 10 reps - Forward Step Up with Counter Support  - 1 x daily - 7 x weekly - 2 sets - 10 reps - Forward Step Down Touch with Heel  - 1 x daily - 7 x weekly - 3 sets - 10 reps - 2 hold  GOALS: Goals reviewed with patient? Yes  SHORT TERM GOALS: Target date: 07/12/2024  09/21/2023 Baseline: Patient will be independent with HEP by discharge allowing him to perform exercises at home with decreased difficulty and improved safety.  Goal status: INITIAL    LONG TERM GOALS: Target date: 09/20/2024  1.  Patient (> 65 years old) will complete five times sit to stand test in < 15 seconds indicating an increased LE strength and improved balance. Baseline: 23.47 without AD. Goal status: INITIAL  2.  Patient will increase ABC Scale by 40% by discharge allowing him to ambulate and perform functional activities with decreased risk of falling.  Baseline: 22.5%. Goal status: INITIAL   3.  Patient will reduce timed up and go to <11 seconds to reduce fall risk and demonstrate improved transfer/gait ability. Baseline: 13.4'' without Ad. Goal status: INITIAL  4.  Patient will increase 10 meter walk test to >1.33m/s as to improve gait speed for better community ambulation and to reduce fall risk. Baseline: 0.77 meters/second Goal status: INITIAL  5.  Patient will increase six minute walk test distance to >1000 for progression to community ambulator and improve gait ability Baseline: 800 ft Goal status: INITIAL    ASSESSMENT:  CLINICAL IMPRESSION:  Treatment focused  on dynamic balance and functional movement patterns including stair management. Pt challenged with various functional activities and various obstacles. Pt had to be instructed in importance of tasks and adjusted difficulty based on his response. He still has balance difficulties at home but unable to verbalize  specific challenges when prompted. Pt will continue to benefit from skilled physical therapy intervention to address impairments, improve QOL, and attain therapy goals.    OBJECTIVE IMPAIRMENTS: Abnormal gait, decreased activity tolerance, decreased balance, decreased coordination, decreased endurance, difficulty walking, decreased strength, impaired sensation, and impaired UE functional use.   ACTIVITY LIMITATIONS: carrying, lifting, bending, standing, squatting, stairs, transfers, and bed mobility  PARTICIPATION LIMITATIONS: meal prep, cleaning, laundry, driving, shopping, community activity, and yard work  PERSONAL FACTORS: 1-2 comorbidities: HTN, CHF, CAD, CVA are also affecting patient's functional outcome.   REHAB POTENTIAL: Good  CLINICAL DECISION MAKING: Stable/uncomplicated  EVALUATION COMPLEXITY: Moderate  PLAN:  PT FREQUENCY: 1-2x/week  PT DURATION: 12 weeks  PLANNED INTERVENTIONS: [id number]- PT Re-evaluation, 97750- Physical Performance Testing, 97110-Therapeutic exercises, 97530- Therapeutic activity, [id number]- Neuromuscular re-education, 97535- Self Care, [id number]- Manual therapy, [id number]- Gait training, Patient/Family education, Balance training, Stair training, and Vestibular training  PLAN FOR NEXT SESSION:  - dynamic balance and functional movement training.    Lonni KATHEE Gainer PT Physical Therapist- [place]  [facility]        "

## 2024-07-31 NOTE — Therapy (Signed)
 " OUTPATIENT OCCUPATIONAL THERAPY NEURO TREATMENT NOTE  Patient Name: [name] MRN: [id number] DOB:31-May-1961, 64 y.o., male Today's Date: 07/31/2024  PCP: Aletha Bene, MD REFERRING PROVIDER: Rosemarie Eather RAMAN, MD  END OF SESSION:  OT End of Session - 07/31/24 2142     Visit Number 4    Number of Visits 12    Date for Recertification  09/20/24    OT Start Time 1103    OT Stop Time 1145    OT Time Calculation (min) 42 min    Activity Tolerance Patient tolerated treatment well    Behavior During Therapy [facility] for tasks assessed/performed          Past Medical History:  Diagnosis Date   Anemia 10/03/2009   Qualifier: Diagnosis of  By: Georgian ROSALEA CHARM Lamar    Anxiety associated with depression 10/11/2013   pt states he is no longer having this issue because he is happy with his job now   Colon cancer [facility])    right colon cancer- adenocarcinoma CEA level isnrmal at 1.8   Coronary artery disease    Diabetes mellitus    Diabetes mellitus type 2 in obese 03/20/2010   Qualifier: Diagnosis of  By: Georgian ROSALEA CHARM Lamar     Erectile dysfunction 06/22/2016   Essential hypertension 06/05/2014   FATTY LIVER DISEASE 11/04/2009   Qualifier: Diagnosis of  By: Abran MD, Norleen SAILOR   Qualifier: Diagnosis of  By: Abran MD, Norleen SAILOR  Last Assessment & Plan:  conirmed by CT scan of abdomen in April 2016 encouraged to minimize simple carbs and fatty foods.   GERD 11/04/2009   Qualifier: Diagnosis of  By: Abran MD, Norleen SAILOR    Great toe pain, right 04/18/2017   Hepatic artery stenosis    History of colon cancer 01/02/2010   Qualifier: Diagnosis of  By: Georgian ROSALEA CHARM Lamar Dr Abran  Last Assessment & Plan:  Follows closely with gastroenterology, Dr Abran   Hyperlipidemia    Hyperlipidemia, mixed 09/07/2010   Qualifier: Diagnosis of  By: Georgian ROSALEA CHARM Lamar    Hypertension    Hypertriglyceridemia 12/14/2010   Hypotestosteronism 06/22/2016   INSOMNIA, CHRONIC 03/20/2010   Qualifier: Diagnosis of  By:  Georgian ROSALEA CHARM Lamar Ambien  10 mg daily does not keep asleep AdvilPM is over sedating and has trouble waking up    Iron  deficiency anemia 2011   Low testosterone  06/22/2016   Malignant neoplasm of colon (HCC) 01/02/2010   Qualifier: Diagnosis of  By: Georgian ROSALEA CHARM Lamar Dr Abran    Muscle spasm of back 06/05/2014   Nipple pain 09/30/2016   Obesity    Palpitations 06/05/2014   Preventative health care 10/08/2012   Psoriasis    Psoriatic arthritis (HCC) 08/25/2017   Right knee pain 11/17/2011   Stroke [facility])    Past Surgical History:  Procedure Laterality Date   BUBBLE STUDY  10/21/2020   Procedure: BUBBLE STUDY;  Surgeon: Mona Vinie BROCKS, MD;  Location: [facility] ENDOSCOPY;  Service: Cardiovascular;;   CARPAL TUNNEL RELEASE     RIGHT   CHOLECYSTECTOMY  1994   COLONOSCOPY     CORONARY ARTERY BYPASS GRAFT N/A 09/19/2023   Procedure: CORONARY ARTERY BYPASS GRAFTING (CABG) TIMES TWO USING LEFT INTERNAL MAMMARY ARTERY AND ENDOSCOPICALLY HARVESTED RIGHT GREATER SAPHENOUS VEIN;  Surgeon: Lucas Dorise POUR, MD;  Location: [facility] OR;  Service: Open Heart Surgery;  Laterality: N/A;   CORONARY/GRAFT ACUTE MI REVASCULARIZATION N/A 10/27/2019   Procedure: Coronary/Graft Acute MI  Revascularization;  Surgeon: Darron Deatrice LABOR, MD;  Location: [facility] INVASIVE CV LAB;  Service: Cardiovascular;  Laterality: N/A;   HEMICOLECTOMY     12/17/2009 right   LEFT HEART CATH AND CORONARY ANGIOGRAPHY N/A 10/27/2019   Procedure: LEFT HEART CATH AND CORONARY ANGIOGRAPHY;  Surgeon: Darron Deatrice LABOR, MD;  Location: [facility] INVASIVE CV LAB;  Service: Cardiovascular;  Laterality: N/A;   LOOP RECORDER INSERTION N/A 10/21/2020   Procedure: LOOP RECORDER INSERTION;  Surgeon: Kelsie Agent, MD;  Location: [facility] INVASIVE CV LAB;  Service: Cardiovascular;  Laterality: N/A;   POLYPECTOMY     RIGHT/LEFT HEART CATH AND CORONARY ANGIOGRAPHY Bilateral 08/12/2023   Procedure: RIGHT/LEFT HEART CATH AND CORONARY ANGIOGRAPHY;  Surgeon: Darron Deatrice LABOR, MD;   Location: [facility] INVASIVE CV LAB;  Service: Cardiovascular;  Laterality: Bilateral;   TEE WITHOUT CARDIOVERSION N/A 10/21/2020   Procedure: TRANSESOPHAGEAL ECHOCARDIOGRAM (TEE);  Surgeon: Mona Vinie BROCKS, MD;  Location: [facility] ENDOSCOPY;  Service: Cardiovascular;  Laterality: N/A;   TEE WITHOUT CARDIOVERSION N/A 09/19/2023   Procedure: TRANSESOPHAGEAL ECHOCARDIOGRAM (TEE);  Surgeon: Lucas Dorise POUR, MD;  Location: [facility] OR;  Service: Open Heart Surgery;  Laterality: N/A;   Patient Active Problem List   Diagnosis Date Noted   CVA (cerebral vascular accident) (HCC) 06/21/2024   S/P CABG x 2 09/22/2023   Coronary artery disease 09/19/2023   Annual physical exam 05/20/2023   Screen for colon cancer 05/20/2023   History of vitamin D  deficiency 05/20/2023   Acute reaction to situational stress 05/20/2023   Psychophysiological insomnia 03/10/2022   Hypertension associated with diabetes (HCC) 11/25/2021   Hyperlipidemia associated with type 2 diabetes mellitus (HCC) 11/25/2021   Diabetes mellitus (HCC) 11/25/2021   History of stroke 10/21/2020   Chronic systolic congestive heart failure (HCC) 11/30/2019   Coronary artery disease involving native coronary artery 11/30/2019   History of ST elevation myocardial infarction (STEMI) 10/27/2019   Psoriatic arthritis (HCC) 08/25/2017   History of colon cancer 01/02/2010   Esophageal reflux 11/04/2009    ONSET DATE: 06/21/24  REFERRING DIAG: CVA with left Hemiparesis, and sensory loss  THERAPY DIAG:  Muscle weakness (generalized)  Other disturbances of skin sensation  Cerebrovascular accident (CVA), unspecified mechanism (HCC)  Rationale for Evaluation and Treatment: Rehabilitation  SUBJECTIVE:   SUBJECTIVE STATEMENT:  Pt. reports that he has changed his mindset, and is now determined to beat this stroke. Pt. Is now engaging his left hand more during daily tasks. Pt accompanied by: self and friend  PERTINENT HISTORY: Pt. is a 64 y.o male who was  admitted to the hospital from 12/4-12/11/2023 with a CVA with left hemiparesis, and sensory loss. Imaging revealed an acute right parietal, temporal, and occipital Ischemic CVA. PMHx includes: HTN associated with DM, Hyperlipidemia associated with DM, CAD, s/p CABG, Hx of CVA.  PRECAUTIONS: None  WEIGHT BEARING RESTRICTIONS: No  PAIN Are you having pain? No  FALLS: Has patient fallen in last 6 months? Yes, repots 4-5 falls recently  LIVING ENVIRONMENT: Lives with: Alone Stairs: 1-2 stes to enter Has following equipment at home: Rollator walker with 3 wheels  PLOF: Reports having had a previous CVA  PATIENT GOALS: To have the sensation in the LUE completely resolve  OBJECTIVE:  Note: Objective measures were completed at Evaluation unless otherwise noted.  HAND DOMINANCE: Left hand dominant.  ADLs: Overall ADLs: Pt. reports that his LUE is numb, therefore he is completely unable to use it during any ADL/ADL tasks. Pt. reports that he is using the RUE for  all ADL tasks. Pt. reports having to use his RUE to support his LUE when attempting to pick up, or hold a drink. Due to agitation, Pt. Did not provide additional information about ADL functioning.   Transfers/ambulation related to ADLs: Eating:  Grooming:  UB Dressing:  LB Dressing:  Toileting:  Bathing:  Tub Shower transfers:  IADLs: Shopping:  Light housekeeping:  Meal Prep:  Community mobility:  Medication management:  Financial management:  Handwriting:   MOBILITY STATUS: Pt. Arrived to the clinic with a 3 wheeled rollator walker.     FUNCTIONAL OUTCOME MEASURES: Eval: MAM-20: Not assessed 2/2 agitation  UPPER EXTREMITY ROM:    Active ROM Right Eval WFL Left eval  Shoulder flexion  132  Shoulder abduction  136  Shoulder adduction    Shoulder extension    Shoulder internal rotation    Shoulder external rotation    Elbow flexion  WFL  Elbow extension  4  Wrist flexion    Wrist extension  70  Wrist  ulnar deviation  32  Wrist radial deviation  18  Wrist pronation    Wrist supination    (Blank rows = not tested)  UPPER EXTREMITY MMT:     MMT Right Eval WFL  Overall Left Eval 4-/5 overall  Shoulder flexion    Shoulder abduction    Shoulder adduction    Shoulder extension    Shoulder internal rotation    Shoulder external rotation    Middle trapezius    Lower trapezius    Elbow flexion    Elbow extension    Wrist flexion    Wrist extension    Wrist ulnar deviation    Wrist radial deviation    Wrist pronation    Wrist supination    (Blank rows = not tested)  HAND FUNCTION: Grip strength: Right: 90 lbs; Left: 38 lbs, Lateral pinch: Right: 15 lbs, Left: 12 lbs, and 3 point pinch: Right: 13 lbs, Left: 10 lbs  07/16/24: Grip strength: Right: 90 lbs; Left: 106 lbs, Lateral pinch: Right: 15 lbs, Left: 19 lbs, and 3 point pinch: Right: 13 lbs, Left:15 lbs  08/01/23: Grip strength: Left: 90 lbs.    COORDINATION: 9 Hole Peg test: Right: 36 sec; Left: Pt. was able to formulate a 2pt. grasp pattern with the left hand in anticipation for grasping the 9 hole pegs from a horizontal position, however was unable to secure a grasp on any of the pegs.  07/16/24: Pt. was able to grasp 1( out of 9) peg from the horizontal position, and place in vertically in the pegboard.  SENSATION: LUE Light touch: Impaired  Proprioception: Impaired   EDEMA: N/A  MUSCLE TONE: WFL  COGNITION: Overall cognitive status:  WFL for tasks performed  VISION: N/T 2/2 agitation  PRAXIS: Impaired: Motor planning                                                                                 TREATMENT DATE: 08/01/23   Therapeutic Activities:   -Facilitated neuroplastic LUE, and hand retraining to promote sensory awareness using: -vibration to both the volar, and dorsal surfaces of the left hand and forearm  -soft tissue massage to  the volar and dorsal aspects of the left forearm, hand digits, and  thumb to increase circulation, and tactile input. -grasping various sized tactile items from sensory container of rice with vision occluded, and delineating between a progression of textures from rough to soft with vision occluded.  -Facilitated modulation of graded pressure when grasping, and gripping items. -Pt. education was provided about compensatory strategies for sensory changes in the LUE, and hand.  PATIENT EDUCATION: Education details: sensory awareness, compensatory strategies, modulation of graded pressure 2/2 over gripping,  Person educated: Patient, Caregiver: Friend Education method: Explanation, Demonstration, Verbal cues, and Handouts Education comprehension: verbalized understanding, returned demonstration, verbal cues required, tactile cues required, and needs further education  HOME EXERCISE PROGRAM:  -Vibration to the hand/forearm -STM  to the hand/forearm  -Blue theraputty hand strengthening exercises with a HEP through [employer]- Pt. Reports preferring to use his grip strengtheners at home   GOALS: Goals reviewed with patient? Yes  SHORT TERM GOALS: Target date: 08/09/2024    Pt. Will be independent with HEPs for LUE strength, and [facility]  Baseline: Eval: Pt. education was provided about a HEP for the LUE. Goal status: INITIAL  LONG TERM GOALS: Target date: 09/20/2024  Pt. Will improve Left hand grip strength by 5# to be able to be able to securely hold items in his left hand Baseline: Eval: Right: 90 lbs, Left: 38# Goal status: INITIAL  2.  Pt. Will improve left hand pinch strength to be able to daily care items with lids. Baseline: Eval: Lteral pinch strength: Right: 15#, Left: 12# 3pt. Pinch strength: Right: 13#, Left: 10# Goal status: INITIAL  3.  Pt. Will improve left hand [facility] skills to be able to grasp, manipulate and place 2 pegs on the 9 hole peg test Baseline: Eval: Right: 36 sec  Left:  Pt. was able to formulate a 2pt. grasp pattern with the left hand  in anticipation for grasping the 9 hole pegs from a horizontal position, however was unable to secure a grasp on any of the pegs. Goal status: INITIAL  4.  Pt. Will LUE strength to assist with ADLs, and IADLs Baseline: Eval: Right: WFL, Left: 4-/5 Goal status: INITIAL  5.  Pt. Will identify 3 compensatory strategies for decreased sensation during ADLs, and IADL tasks. Baseline: Eval: Education to be provided Goal status: INITIAL   ASSESSMENT:  CLINICAL IMPRESSION:  Pt. reports that he has been working with his hand a lot at home, and continues to engage his left hand more during daily ADL, and IADL tasks. Pt. reports that he has been taking hot showers, and did feel it in his left side when the water turned too hot. Pt. reports purchasing a hand machine through [employer]. Pt. Reports that he inserts his hand in it as it has various settings for vibration, heat and compression. Pt. reports the compression setting left indentation marks on his hand, and at times provides too much compression. Pt. Reports that he plans to bring in this personal device to the next therapy visit for the therapist to see it. Pt. Reports that he bought a compression sleeve, and is wearing it on the left hand at home. Pt. Is now able to put his jacket on more automatically, and efficiently. These are tasks that the Pt. Is now using his left hand to perform more tasks that he was unable to perform initially with his left hand. Pt. is now able to grasp and place 1 peg in the [address] Peg Test  which also reflects an improvement from the initial evaluation. Pt. was able to feel vibration more pronounced today with the exception of one small area on his volar forearm, Pt. Continues to be able to feel rice grains, and was able to  distinguish between the container of rice grains, and larger objects with his vision, however, had difficulty distinguishing between objects with his vision occluded. Pt. had more difficulty distinguishing  between contrasting textures i.e. velco to cotton balls. Pt. Had difficulty with modulating graded pressure initially over gripping the light items, and improving as the reps, and the task progressed. Pt. reports over gripping cereal boxes at home. Pt. continues to benefit from OT services to work on improving dominant LUE functioning in order to increase engagement during ADLs, and IADL tasks, as well as to provide education about compensatory strategies for LUE sensory loss during ADLs, and IADL tasks.  PERFORMANCE DEFICITS: in functional skills including ADLs, IADLs, coordination, dexterity, proprioception, sensation, strength, Fine motor control, Gross motor control, decreased knowledge of use of DME, and UE functional use, and psychosocial skills including coping strategies, environmental adaptation, interpersonal interactions, and routines and behaviors.   IMPAIRMENTS: are limiting patient from ADLs, IADLs, and leisure.   CO-MORBIDITIES: may have co-morbidities  that affects occupational performance. Patient will benefit from skilled OT to address above impairments and improve overall function.  MODIFICATION OR ASSISTANCE TO COMPLETE EVALUATION: Min-Moderate modification of tasks or assist with assess necessary to complete an evaluation.  OT OCCUPATIONAL PROFILE AND HISTORY: Detailed assessment: Review of records and additional review of physical, cognitive, psychosocial history related to current functional performance.  CLINICAL DECISION MAKING: Moderate - several treatment options, min-mod task modification necessary  REHAB POTENTIAL: Good  EVALUATION COMPLEXITY: Moderate    PLAN:  OT FREQUENCY: 1 OT DURATION: 12 weeks  PLANNED INTERVENTIONS: 97168 OT Re-evaluation, 97535 self care/ADL training, 02889 therapeutic exercise, 97530 therapeutic activity, 97112 neuromuscular re-education, 97140 manual therapy, functional mobility training, energy conservation, patient/family education,  and DME and/or AE instructions  RECOMMENDED OTHER SERVICES: PT  CONSULTED AND AGREED WITH PLAN OF CARE: Patient and friend  PLAN FOR NEXT SESSION: Treatment, completion of MAM-20-If Pt. Is agreeable  Richardson Otter, MS, OTR/L  07/31/2024, 9:48 PM           "

## 2024-08-02 ENCOUNTER — Telehealth: Payer: Self-pay | Admitting: Pharmacy Technician

## 2024-08-02 ENCOUNTER — Other Ambulatory Visit (HOSPITAL_COMMUNITY): Payer: Self-pay

## 2024-08-02 NOTE — Telephone Encounter (Signed)
 Pharmacy Patient Advocate Encounter   Received notification from [facility] that prior authorization for Ozempic  (2 MG/DOSE) 8MG /3ML pen-injectors is required/requested.   Insurance verification completed.   The patient is insured through [facility].   Per test claim: PA required; PA started via CoverMyMeds. KEY [id number] . Waiting for clinical questions to populate.

## 2024-08-02 NOTE — Telephone Encounter (Signed)
 Pharmacy Patient Advocate Encounter  Received notification from [facility] MEDICAID that Prior Authorization for Ozempic  (2 MG/DOSE) 8MG /3ML pen-injectors has been APPROVED from 08/02/24 to 08/02/25. Ran test claim, Copay is $4.00. This test claim was processed through [facility]- copay amounts may vary at other pharmacies due to pharmacy/plan contracts, or as the patient moves through the different stages of their insurance plan.   PA #/Case ID/Reference #: [id number]

## 2024-08-02 NOTE — Telephone Encounter (Signed)
 Pharmacy Patient Advocate Encounter   Received notification from [employer] KEY that prior authorization for QUEtiapine  Fumarate 100MG  tablets is required/requested.   Insurance verification completed.   The patient is insured through [facility].   Per test claim: PA required; PA started via CoverMyMeds. KEY [id number] . Waiting for clinical questions to populate.

## 2024-08-02 NOTE — Telephone Encounter (Signed)
 Pharmacy Patient Advocate Encounter  Received notification from [facility] MEDICAID that Prior Authorization for QUEtiapine  Fumarate 100MG  tablets has been APPROVED from 08/02/24 to 08/02/25. Ran test claim, Copay is $4.00. This test claim was processed through [facility]- copay amounts may vary at other pharmacies due to pharmacy/plan contracts, or as the patient moves through the different stages of their insurance plan.   PA #/Case ID/Reference #: [id number]

## 2024-08-03 NOTE — Progress Notes (Signed)
 Remote Loop Recorder Transmission

## 2024-08-05 ENCOUNTER — Ambulatory Visit: Payer: Self-pay | Admitting: Cardiology

## 2024-08-06 ENCOUNTER — Ambulatory Visit: Admitting: Occupational Therapy

## 2024-08-06 ENCOUNTER — Ambulatory Visit

## 2024-08-06 DIAGNOSIS — I639 Cerebral infarction, unspecified: Secondary | ICD-10-CM

## 2024-08-06 DIAGNOSIS — R208 Other disturbances of skin sensation: Secondary | ICD-10-CM

## 2024-08-06 DIAGNOSIS — M6281 Muscle weakness (generalized): Secondary | ICD-10-CM

## 2024-08-06 DIAGNOSIS — R278 Other lack of coordination: Secondary | ICD-10-CM

## 2024-08-06 DIAGNOSIS — R2681 Unsteadiness on feet: Secondary | ICD-10-CM

## 2024-08-06 DIAGNOSIS — R2689 Other abnormalities of gait and mobility: Secondary | ICD-10-CM

## 2024-08-06 NOTE — Therapy (Addendum)
 " OUTPATIENT OCCUPATIONAL THERAPY NEURO TREATMENT NOTE  Patient Name: [name] MRN: [id number] DOB:18-Sep-1960, 64 y.o., male Today's Date: 08/06/2024  PCP: Aletha Bene, MD REFERRING PROVIDER: Rosemarie Eather RAMAN, MD  END OF SESSION:  OT End of Session - 08/06/24 1112     Visit Number 5    Number of Visits 12    Date for Recertification  09/20/24    OT Start Time 1015    OT Stop Time 1055    OT Time Calculation (min) 40 min    Activity Tolerance Patient tolerated treatment well    Behavior During Therapy [facility] for tasks assessed/performed          Past Medical History:  Diagnosis Date   Anemia 10/03/2009   Qualifier: Diagnosis of  By: Georgian ROSALEA CHARM Lamar    Anxiety associated with depression 10/11/2013   pt states he is no longer having this issue because he is happy with his job now   Colon cancer [facility])    right colon cancer- adenocarcinoma CEA level isnrmal at 1.8   Coronary artery disease    Diabetes mellitus    Diabetes mellitus type 2 in obese 03/20/2010   Qualifier: Diagnosis of  By: Georgian ROSALEA CHARM Lamar     Erectile dysfunction 06/22/2016   Essential hypertension 06/05/2014   FATTY LIVER DISEASE 11/04/2009   Qualifier: Diagnosis of  By: Abran MD, Norleen SAILOR   Qualifier: Diagnosis of  By: Abran MD, Norleen SAILOR  Last Assessment & Plan:  conirmed by CT scan of abdomen in April 2016 encouraged to minimize simple carbs and fatty foods.   GERD 11/04/2009   Qualifier: Diagnosis of  By: Abran MD, Norleen SAILOR    Great toe pain, right 04/18/2017   Hepatic artery stenosis    History of colon cancer 01/02/2010   Qualifier: Diagnosis of  By: Georgian ROSALEA CHARM Lamar Dr Abran  Last Assessment & Plan:  Follows closely with gastroenterology, Dr Abran   Hyperlipidemia    Hyperlipidemia, mixed 09/07/2010   Qualifier: Diagnosis of  By: Georgian ROSALEA CHARM Lamar    Hypertension    Hypertriglyceridemia 12/14/2010   Hypotestosteronism 06/22/2016   INSOMNIA, CHRONIC 03/20/2010   Qualifier: Diagnosis of  By:  Georgian ROSALEA CHARM Lamar Ambien  10 mg daily does not keep asleep AdvilPM is over sedating and has trouble waking up    Iron  deficiency anemia 2011   Low testosterone  06/22/2016   Malignant neoplasm of colon (HCC) 01/02/2010   Qualifier: Diagnosis of  By: Georgian ROSALEA CHARM Lamar Dr Abran    Muscle spasm of back 06/05/2014   Nipple pain 09/30/2016   Obesity    Palpitations 06/05/2014   Preventative health care 10/08/2012   Psoriasis    Psoriatic arthritis (HCC) 08/25/2017   Right knee pain 11/17/2011   Stroke [facility])    Past Surgical History:  Procedure Laterality Date   BUBBLE STUDY  10/21/2020   Procedure: BUBBLE STUDY;  Surgeon: Mona Vinie BROCKS, MD;  Location: [facility] ENDOSCOPY;  Service: Cardiovascular;;   CARPAL TUNNEL RELEASE     RIGHT   CHOLECYSTECTOMY  1994   COLONOSCOPY     CORONARY ARTERY BYPASS GRAFT N/A 09/19/2023   Procedure: CORONARY ARTERY BYPASS GRAFTING (CABG) TIMES TWO USING LEFT INTERNAL MAMMARY ARTERY AND ENDOSCOPICALLY HARVESTED RIGHT GREATER SAPHENOUS VEIN;  Surgeon: Lucas Dorise POUR, MD;  Location: [facility] OR;  Service: Open Heart Surgery;  Laterality: N/A;   CORONARY/GRAFT ACUTE MI REVASCULARIZATION N/A 10/27/2019   Procedure: Coronary/Graft Acute MI  Revascularization;  Surgeon: Darron Deatrice LABOR, MD;  Location: [facility] INVASIVE CV LAB;  Service: Cardiovascular;  Laterality: N/A;   HEMICOLECTOMY     12/17/2009 right   LEFT HEART CATH AND CORONARY ANGIOGRAPHY N/A 10/27/2019   Procedure: LEFT HEART CATH AND CORONARY ANGIOGRAPHY;  Surgeon: Darron Deatrice LABOR, MD;  Location: [facility] INVASIVE CV LAB;  Service: Cardiovascular;  Laterality: N/A;   LOOP RECORDER INSERTION N/A 10/21/2020   Procedure: LOOP RECORDER INSERTION;  Surgeon: Kelsie Agent, MD;  Location: [facility] INVASIVE CV LAB;  Service: Cardiovascular;  Laterality: N/A;   POLYPECTOMY     RIGHT/LEFT HEART CATH AND CORONARY ANGIOGRAPHY Bilateral 08/12/2023   Procedure: RIGHT/LEFT HEART CATH AND CORONARY ANGIOGRAPHY;  Surgeon: Darron Deatrice LABOR, MD;   Location: [facility] INVASIVE CV LAB;  Service: Cardiovascular;  Laterality: Bilateral;   TEE WITHOUT CARDIOVERSION N/A 10/21/2020   Procedure: TRANSESOPHAGEAL ECHOCARDIOGRAM (TEE);  Surgeon: Mona Vinie BROCKS, MD;  Location: [facility] ENDOSCOPY;  Service: Cardiovascular;  Laterality: N/A;   TEE WITHOUT CARDIOVERSION N/A 09/19/2023   Procedure: TRANSESOPHAGEAL ECHOCARDIOGRAM (TEE);  Surgeon: Lucas Dorise POUR, MD;  Location: [facility] OR;  Service: Open Heart Surgery;  Laterality: N/A;   Patient Active Problem List   Diagnosis Date Noted   CVA (cerebral vascular accident) (HCC) 06/21/2024   S/P CABG x 2 09/22/2023   Coronary artery disease 09/19/2023   Annual physical exam 05/20/2023   Screen for colon cancer 05/20/2023   History of vitamin D  deficiency 05/20/2023   Acute reaction to situational stress 05/20/2023   Psychophysiological insomnia 03/10/2022   Hypertension associated with diabetes (HCC) 11/25/2021   Hyperlipidemia associated with type 2 diabetes mellitus (HCC) 11/25/2021   Diabetes mellitus (HCC) 11/25/2021   History of stroke 10/21/2020   Chronic systolic congestive heart failure (HCC) 11/30/2019   Coronary artery disease involving native coronary artery 11/30/2019   History of ST elevation myocardial infarction (STEMI) 10/27/2019   Psoriatic arthritis (HCC) 08/25/2017   History of colon cancer 01/02/2010   Esophageal reflux 11/04/2009    ONSET DATE: 06/21/24  REFERRING DIAG: CVA with left Hemiparesis, and sensory loss  THERAPY DIAG:  Muscle weakness (generalized)  Other lack of coordination  Rationale for Evaluation and Treatment: Rehabilitation  SUBJECTIVE:   SUBJECTIVE STATEMENT:  Pt. Is requesting to stop therapy for a month, continue working with his LUE at home, and reassess his status in a month 2/2 sensory limitations. Pt accompanied by: self and friend  PERTINENT HISTORY: Pt. is a 64 y.o male who was admitted to the hospital from 12/4-12/11/2023 with a CVA with left  hemiparesis, and sensory loss. Imaging revealed an acute right parietal, temporal, and occipital Ischemic CVA. PMHx includes: HTN associated with DM, Hyperlipidemia associated with DM, CAD, s/p CABG, Hx of CVA.  PRECAUTIONS: None  WEIGHT BEARING RESTRICTIONS: No  PAIN Are you having pain? No  FALLS: Has patient fallen in last 6 months? Yes, repots 4-5 falls recently  LIVING ENVIRONMENT: Lives with: Alone Stairs: 1-2 stes to enter Has following equipment at home: Rollator walker with 3 wheels  PLOF: Reports having had a previous CVA  PATIENT GOALS: To have the sensation in the LUE completely resolve  OBJECTIVE:  Note: Objective measures were completed at Evaluation unless otherwise noted.  HAND DOMINANCE: Left hand dominant.  ADLs: Overall ADLs: Pt. reports that his LUE is numb, therefore he is completely unable to use it during any ADL/ADL tasks. Pt. reports that he is using the RUE for all ADL tasks. Pt. reports having to use his  RUE to support his LUE when attempting to pick up, or hold a drink. Due to agitation, Pt. Did not provide additional information about ADL functioning.   Transfers/ambulation related to ADLs: Eating:  Grooming:  UB Dressing:  LB Dressing:  Toileting:  Bathing:  Tub Shower transfers:  IADLs: Shopping:  Light housekeeping:  Meal Prep:  Community mobility:  Medication management:  Financial management:  Handwriting:   MOBILITY STATUS: Pt. Arrived to the clinic with a 3 wheeled rollator walker.     FUNCTIONAL OUTCOME MEASURES: Eval: MAM-20: Not assessed 2/2 agitation  UPPER EXTREMITY ROM:    Active ROM Right Eval WFL Left eval  Shoulder flexion  132  Shoulder abduction  136  Shoulder adduction    Shoulder extension    Shoulder internal rotation    Shoulder external rotation    Elbow flexion  WFL  Elbow extension  4  Wrist flexion    Wrist extension  70  Wrist ulnar deviation  32  Wrist radial deviation  18  Wrist pronation     Wrist supination    (Blank rows = not tested)  UPPER EXTREMITY MMT:     MMT Right Eval WFL  Overall Left Eval 4-/5 overall  Shoulder flexion    Shoulder abduction    Shoulder adduction    Shoulder extension    Shoulder internal rotation    Shoulder external rotation    Middle trapezius    Lower trapezius    Elbow flexion    Elbow extension    Wrist flexion    Wrist extension    Wrist ulnar deviation    Wrist radial deviation    Wrist pronation    Wrist supination    (Blank rows = not tested)  HAND FUNCTION: Grip strength: Right: 90 lbs; Left: 38 lbs, Lateral pinch: Right: 15 lbs, Left: 12 lbs, and 3 point pinch: Right: 13 lbs, Left: 10 lbs  07/16/24: Grip strength: Right: 90 lbs; Left: 106 lbs, Lateral pinch: Right: 15 lbs, Left: 19 lbs, and 3 point pinch: Right: 13 lbs, Left:15 lbs  08/01/23: Grip strength: Left: 90 lbs.    COORDINATION: 9 Hole Peg test: Right: 36 sec; Left: Pt. was able to formulate a 2pt. grasp pattern with the left hand in anticipation for grasping the 9 hole pegs from a horizontal position, however was unable to secure a grasp on any of the pegs.  07/16/24: Pt. was able to grasp 1( out of 9) peg from the horizontal position, and place in vertically in the pegboard.  SENSATION: LUE Light touch: Impaired  Proprioception: Impaired   EDEMA: N/A  MUSCLE TONE: WFL  COGNITION: Overall cognitive status:  WFL for tasks performed  VISION: N/T 2/2 agitation  PRAXIS: Impaired: Motor planning                                                                                 TREATMENT DATE: 08/07/23   Therapeutic Activities:   -Facilitated neuroplastic LUE, and hand retraining to promote sensory awareness using: -vibration to both the volar, and dorsal surfaces of the left hand and forearm to increase sensory input. Reps were performed with vision occluded with having the Pt.  identify when the vibration is being applied on, and  off. -Proprioceptive awareness to the LUE at the tabletop surface. -Mirror-Box therapy for neuroplastic retraining. -Assessed multiple different pen grips, as well as a PenAgain. Pt. was able to achieve greater pen stabilization with the PenAgain.    PATIENT EDUCATION: Education details: sensory awareness, compensatory strategies, modulation of graded pressure 2/2 over gripping,  Person educated: Patient, Caregiver: Friend Education method: Explanation, Demonstration, Verbal cues, and Handouts Education comprehension: verbalized understanding, returned demonstration, verbal cues required, tactile cues required, and needs further education  HOME EXERCISE PROGRAM:  -Vibration to the hand/forearm -STM  to the hand/forearm  -Blue theraputty hand strengthening exercises with a HEP through [employer]- Pt. Reports preferring to use his grip strengtheners at home   GOALS: Goals reviewed with patient? Yes  SHORT TERM GOALS: Target date: 08/09/2024    Pt. Will be independent with HEPs for LUE strength, and [facility]  Baseline: Eval: Pt. education was provided about a HEP for the LUE. Goal status: INITIAL  LONG TERM GOALS: Target date: 09/20/2024  Pt. Will improve Left hand grip strength by 5# to be able to be able to securely hold items in his left hand Baseline: Eval: Right: 90 lbs, Left: 38# Goal status: INITIAL  2.  Pt. Will improve left hand pinch strength to be able to daily care items with lids. Baseline: Eval: Lteral pinch strength: Right: 15#, Left: 12# 3pt. Pinch strength: Right: 13#, Left: 10# Goal status: INITIAL  3.  Pt. Will improve left hand [facility] skills to be able to grasp, manipulate and place 2 pegs on the 9 hole peg test Baseline: Eval: Right: 36 sec  Left:  Pt. was able to formulate a 2pt. grasp pattern with the left hand in anticipation for grasping the 9 hole pegs from a horizontal position, however was unable to secure a grasp on any of the pegs. Goal status: INITIAL  4.   Pt. Will LUE strength to assist with ADLs, and IADLs Baseline: Eval: Right: WFL, Left: 4-/5 Goal status: INITIAL  5.  Pt. Will identify 3 compensatory strategies for decreased sensation during ADLs, and IADL tasks. Baseline: Eval: Education to be provided Goal status: INITIAL   ASSESSMENT:  CLINICAL IMPRESSION:  Pt. reports that he continues to work a lot with his hand at home, and continues to engage his left hand during more daily ADL, and IADL tasks. Pt. reports that he was able to hold a utensil stable with his left hand while eating at [facility]. Pt. reports that he is now able to stabilize his phone with his left hand while using it with his right hand. Pt. Is able to place his phone in his back pocket with his right hand, however is unable to with his left hand 2/2 decreased sensory awareness. Pt. was able to feel vibration accurately 100% of the time when applied/removed, however has difficulty with consistently identifying the exact location of the vibration application on the left side of the UE.  Pt. was able to stabilize PenAgain with his left hand while formulating smaller written numbers. Pt. continues to present with decreased sensory awareness in the LUE. Pt. has a low frustration threshold/tolerance regarding sensory impairment. Pt. is requesting to hold OT/PT services for a month, continue working at home, and reassess at that time 2/2 LUE sensory limitations. Will plan to reassess Pt. statue when he returns to the clinic.   PERFORMANCE DEFICITS: in functional skills including ADLs, IADLs, coordination, dexterity, proprioception, sensation, strength, Fine  motor control, Gross motor control, decreased knowledge of use of DME, and UE functional use, and psychosocial skills including coping strategies, environmental adaptation, interpersonal interactions, and routines and behaviors.   IMPAIRMENTS: are limiting patient from ADLs, IADLs, and leisure.   CO-MORBIDITIES: may have  co-morbidities  that affects occupational performance. Patient will benefit from skilled OT to address above impairments and improve overall function.  MODIFICATION OR ASSISTANCE TO COMPLETE EVALUATION: Min-Moderate modification of tasks or assist with assess necessary to complete an evaluation.  OT OCCUPATIONAL PROFILE AND HISTORY: Detailed assessment: Review of records and additional review of physical, cognitive, psychosocial history related to current functional performance.  CLINICAL DECISION MAKING: Moderate - several treatment options, min-mod task modification necessary  REHAB POTENTIAL: Good  EVALUATION COMPLEXITY: Moderate    PLAN:  OT FREQUENCY: 1 OT DURATION: 12 weeks  PLANNED INTERVENTIONS: 97168 OT Re-evaluation, 97535 self care/ADL training, 02889 therapeutic exercise, 97530 therapeutic activity, 97112 neuromuscular re-education, 97140 manual therapy, functional mobility training, energy conservation, patient/family education, and DME and/or AE instructions  RECOMMENDED OTHER SERVICES: PT  CONSULTED AND AGREED WITH PLAN OF CARE: Patient and friend  PLAN FOR NEXT SESSION: Treatment, completion of MAM-20-If Pt. Is agreeable  Richardson Otter, MS, OTR/L  08/06/2024, 11:18 AM           "

## 2024-08-06 NOTE — Therapy (Signed)
 " OUTPATIENT PHYSICAL THERAPY NEURO TREATMENT   Patient Name: [name] MRN: [id number] DOB:1960-08-04, 64 y.o., male Today's Date: 08/06/2024   PCP: Aletha Bene, MD   REFERRING PROVIDER: Rosemarie Eather RAMAN, MD   END OF SESSION:  PT End of Session - 08/06/24 1058     Visit Number 7    Number of Visits 25    Date for Recertification  09/20/24    Progress Note Due on Visit 10    PT Start Time 1100    PT Stop Time 1144    PT Time Calculation (min) 44 min    Equipment Utilized During Treatment Gait belt    Activity Tolerance Patient tolerated treatment well    Behavior During Therapy [facility] for tasks assessed/performed            Past Medical History:  Diagnosis Date   Anemia 10/03/2009   Qualifier: Diagnosis of  By: Georgian ROSALEA CHARM Lamar    Anxiety associated with depression 10/11/2013   pt states he is no longer having this issue because he is happy with his job now   Colon cancer [facility])    right colon cancer- adenocarcinoma CEA level isnrmal at 1.8   Coronary artery disease    Diabetes mellitus    Diabetes mellitus type 2 in obese 03/20/2010   Qualifier: Diagnosis of  By: Georgian ROSALEA CHARM Lamar     Erectile dysfunction 06/22/2016   Essential hypertension 06/05/2014   FATTY LIVER DISEASE 11/04/2009   Qualifier: Diagnosis of  By: Abran MD, Norleen SAILOR   Qualifier: Diagnosis of  By: Abran MD, Norleen SAILOR  Last Assessment & Plan:  conirmed by CT scan of abdomen in April 2016 encouraged to minimize simple carbs and fatty foods.   GERD 11/04/2009   Qualifier: Diagnosis of  By: Abran MD, Norleen SAILOR    Great toe pain, right 04/18/2017   Hepatic artery stenosis    History of colon cancer 01/02/2010   Qualifier: Diagnosis of  By: Georgian ROSALEA CHARM Lamar Dr Abran  Last Assessment & Plan:  Follows closely with gastroenterology, Dr Abran   Hyperlipidemia    Hyperlipidemia, mixed 09/07/2010   Qualifier: Diagnosis of  By: Georgian ROSALEA CHARM Lamar    Hypertension    Hypertriglyceridemia 12/14/2010    Hypotestosteronism 06/22/2016   INSOMNIA, CHRONIC 03/20/2010   Qualifier: Diagnosis of  By: Georgian ROSALEA CHARM Lamar Ambien  10 mg daily does not keep asleep AdvilPM is over sedating and has trouble waking up    Iron  deficiency anemia 2011   Low testosterone  06/22/2016   Malignant neoplasm of colon (HCC) 01/02/2010   Qualifier: Diagnosis of  By: Georgian ROSALEA CHARM Lamar Dr Abran    Muscle spasm of back 06/05/2014   Nipple pain 09/30/2016   Obesity    Palpitations 06/05/2014   Preventative health care 10/08/2012   Psoriasis    Psoriatic arthritis (HCC) 08/25/2017   Right knee pain 11/17/2011   Stroke [facility])    Past Surgical History:  Procedure Laterality Date   BUBBLE STUDY  10/21/2020   Procedure: BUBBLE STUDY;  Surgeon: Mona Vinie BROCKS, MD;  Location: [facility] ENDOSCOPY;  Service: Cardiovascular;;   CARPAL TUNNEL RELEASE     RIGHT   CHOLECYSTECTOMY  1994   COLONOSCOPY     CORONARY ARTERY BYPASS GRAFT N/A 09/19/2023   Procedure: CORONARY ARTERY BYPASS GRAFTING (CABG) TIMES TWO USING LEFT INTERNAL MAMMARY ARTERY AND ENDOSCOPICALLY HARVESTED RIGHT GREATER SAPHENOUS VEIN;  Surgeon: Lucas Dorise POUR, MD;  Location:  [facility] OR;  Service: Open Heart Surgery;  Laterality: N/A;   CORONARY/GRAFT ACUTE MI REVASCULARIZATION N/A 10/27/2019   Procedure: Coronary/Graft Acute MI Revascularization;  Surgeon: Darron Deatrice LABOR, MD;  Location: [facility] INVASIVE CV LAB;  Service: Cardiovascular;  Laterality: N/A;   HEMICOLECTOMY     12/17/2009 right   LEFT HEART CATH AND CORONARY ANGIOGRAPHY N/A 10/27/2019   Procedure: LEFT HEART CATH AND CORONARY ANGIOGRAPHY;  Surgeon: Darron Deatrice LABOR, MD;  Location: [facility] INVASIVE CV LAB;  Service: Cardiovascular;  Laterality: N/A;   LOOP RECORDER INSERTION N/A 10/21/2020   Procedure: LOOP RECORDER INSERTION;  Surgeon: Kelsie Agent, MD;  Location: [facility] INVASIVE CV LAB;  Service: Cardiovascular;  Laterality: N/A;   POLYPECTOMY     RIGHT/LEFT HEART CATH AND CORONARY ANGIOGRAPHY Bilateral 08/12/2023    Procedure: RIGHT/LEFT HEART CATH AND CORONARY ANGIOGRAPHY;  Surgeon: Darron Deatrice LABOR, MD;  Location: [facility] INVASIVE CV LAB;  Service: Cardiovascular;  Laterality: Bilateral;   TEE WITHOUT CARDIOVERSION N/A 10/21/2020   Procedure: TRANSESOPHAGEAL ECHOCARDIOGRAM (TEE);  Surgeon: Mona Vinie BROCKS, MD;  Location: [facility] ENDOSCOPY;  Service: Cardiovascular;  Laterality: N/A;   TEE WITHOUT CARDIOVERSION N/A 09/19/2023   Procedure: TRANSESOPHAGEAL ECHOCARDIOGRAM (TEE);  Surgeon: Lucas Dorise POUR, MD;  Location: [facility] OR;  Service: Open Heart Surgery;  Laterality: N/A;   Patient Active Problem List   Diagnosis Date Noted   CVA (cerebral vascular accident) (HCC) 06/21/2024   S/P CABG x 2 09/22/2023   Coronary artery disease 09/19/2023   Annual physical exam 05/20/2023   Screen for colon cancer 05/20/2023   History of vitamin D  deficiency 05/20/2023   Acute reaction to situational stress 05/20/2023   Psychophysiological insomnia 03/10/2022   Hypertension associated with diabetes (HCC) 11/25/2021   Hyperlipidemia associated with type 2 diabetes mellitus (HCC) 11/25/2021   Diabetes mellitus (HCC) 11/25/2021   History of stroke 10/21/2020   Chronic systolic congestive heart failure (HCC) 11/30/2019   Coronary artery disease involving native coronary artery 11/30/2019   History of ST elevation myocardial infarction (STEMI) 10/27/2019   Psoriatic arthritis (HCC) 08/25/2017   History of colon cancer 01/02/2010   Esophageal reflux 11/04/2009    ONSET DATE: 06/21/2024  REFERRING DIAG:  I63.9 (ICD-10-CM) - Cryptogenic stroke (HCC)  R29.818 (ICD-10-CM) - Hemisensory deficit    THERAPY DIAG:  Muscle weakness of lower extremity  Other abnormalities of gait and mobility  Unsteadiness on feet  Muscle weakness (generalized)  Other disturbances of skin sensation  Cerebrovascular accident (CVA), unspecified mechanism (HCC)  Other lack of coordination  Rationale for Evaluation and Treatment:  Rehabilitation  SUBJECTIVE:   Pt reports doing okay. Patient denies any recent falls. Patient requests pausing PT treatment for one month. He reports he does feel stronger such as when standing in shower. He reports that he has been using treadmill and elliptical at home. Patient reports he is only able to perform elliptical x5' prior to becoming fatigued and having to stop.  SUBJECTIVE STATEMENT: Patient began to have symptoms of stroke the night of 06/20/2024, but did not go to ER until 06/21/2024. He was noted to have L sided weakness along with decreased sensation. Pt. Did not receive tpa at hospital because it was too late. Patient reports he was in hospital for one day then released. He continues to have numbness and weakness on L side. Pt initially had a stroke effecting R side about 2-3 years ago that he was still dealing with including decreased balance and right sided weakness. He was using cane prior to this most recent stroke for ambulation, but is now using 3 wheeled walker. Patient reports he gets fatigued very easily with simple tasks. He also reports intermittent tremor in L leg.    Pt accompanied by: friend.   PERTINENT HISTORY: Patient experienced a right sided stroke on 06/20/2024 resulting in L hemiparesis and loss of sensation.  PAIN:  Are you having pain? No  PRECAUTIONS: Fall  RED FLAGS: None   WEIGHT BEARING RESTRICTIONS: No  FALLS: Has patient fallen in last 6 months? Yes. Number of falls 5x on 06/21/2024.   LIVING ENVIRONMENT: Lives with: lives with their family and lives alone Lives in: House/apartment Stairs: Yes: External: 2 steps; none Has following equipment at home: Single point cane, Environmental Consultant - 2 wheeled, Wheelchair (manual), shower chair, and Grab bars  PLOF:  Independent  PATIENT GOALS: Improve LE strength, gait, balance, independence with mobility and functional tasks at home, and yard work.   OBJECTIVE:  Note: Objective measures were completed at Evaluation unless otherwise noted.  DIAGNOSTIC FINDINGS: IMPRESSION: 1. Multiple small acute ischemic infarcts within the right hemisphere affecting parts of the right parietal cortex, right temporal cortex, and the right occipital lobe, with punctate foci of acute ischemia in the right frontal white matter. 2. Multifocal hyperintense T2-weighted signal within the cerebral white matter, most commonly due to chronic small vessel disease.    COGNITION: Overall cognitive status: Within functional limits for tasks assessed   SENSATION:L lower extremity.  Light touch: Impaired  Proprioception: Impaired   COORDINATION: Decreased speed and accuracy with toe tap.    MUSCLE TONE: LLE: Within functional limits   POSTURE: rounded shoulders  LOWER EXTREMITY ROM:     Active  Right Eval Left Eval  Hip flexion    Hip extension    Hip abduction    Hip adduction    Hip internal rotation    Hip external rotation    Knee flexion    Knee extension    Ankle dorsiflexion    Ankle plantarflexion    Ankle inversion    Ankle eversion     (Blank rows = not tested)  LOWER EXTREMITY MMT:    MMT Right Eval Left Eval  Hip flexion 4 4  Hip extension    Hip abduction 5 4-  Hip adduction    Hip internal rotation    Hip external rotation    Knee flexion 5 5  Knee extension 5 5  Ankle dorsiflexion 5 5  Ankle plantarflexion    Ankle inversion    Ankle eversion    (Blank rows = not tested)  BED MOBILITY:  Independent  TRANSFERS: Independent  RAMP:  Not tested  CURB:  Not tested  STAIRS: Not tested GAIT: Findings: Gait Characteristics: ataxic, Distance walked: 100', and Comments: Very slight ataxia with gait.  FUNCTIONAL TESTS:  5 times sit to stand: 23.47 Timed up and go  (TUG): 13.4 without AD. 10 MWT:0.77 m/s  without AD. 6 minute walk test: Perform at initial treatment. Berg Balance Scale:  Item Test date: 06/28/2024 Date:  Date:   Sitting to standing 4. able to stand without using hands and stabilize independently Insert SmartPhrase OPRCBERGREEVAL Insert SmartPhrase OPRCBERGREEVAL  2. Standing unsupported 4. able to stand safely for 2 minutes    3. Sitting with back unsupported, feet supported 4. able to sit safely and securely for 2 minutes    4. Standing to sitting 3. controls descent by using hands    5. Pivot transfer  4. able to transfer safely with minor use of hands    6. Standing unsupported with eyes closed 4. able to stand 10 seconds safely    7. Standing unsupported with feet together 4. able to place feet together independently and stand 1 minute safely    8. Reaching forward with outstretched arms while standing 4. can reach forward confidently 25 cm (10 inches)    9. Pick up object from the floor from standing 3. able to pick up slipper but needs supervision    10. Turning to look behind over left and right shoulders while standing 3. looks behind one side only, other side shows less weight shift    11. Turn 360 degrees 3. able to turn 360 degrees safely, one side only, in 4 seconds or less    12. Place alternate foot on step or stool while standing unsupported 4. ble to stand independently and safely and complete 8 steps in 20 seconds    13. Standing unsupported one foot in front 3. able to place foot ahead independently and hold 30 seconds    14. Standing on one leg 4. able to lift leg independently and hold > 10 seconds     Total Score 51/56 Total Score:    Total Score:      PATIENT SURVEYS:  ABC scale: The Activities-Specific Balance Confidence (ABC) Scale 0% 10 20 30  40 50 60 70 80 90 100% No confidence<->completely confident  How confident are you that you will not lose your balance or become unsteady when you . . .   Date tested  06/28/2024  Walk around the house 50%  2. Walk up or down stairs 0%  3. Bend over and pick up a slipper from in front of a closet floor 30%  4. Reach for a small can off a shelf at eye level 30%  5. Stand on tip toes and reach for something above your head 30%  6. Stand on a chair and reach for something 0%  7. Sweep the floor 0%  8. Walk outside the house to a car parked in the driveway 40%  9. Get into or out of a car 50%  10. Walk across a parking lot to the mall 40%  11. Walk up or down a ramp 20%  12. Walk in a crowded mall where people rapidly walk past you 20%  13. Are bumped into by people as you walk through the mall 0%  14. Step onto or off of an escalator while you are holding onto the railing 50%  15. Step onto or off an escalator while holding onto parcels such that you cannot hold onto the railing 0%  16. Walk outside on icy sidewalks 0%  Total: #/16 22.5%  TREATMENT DATE: 07/10/2024   *Patient presented to clinic without AD.   -Pt began with sit to stands 2x10 using L UE for assist with lift off and controlled descent along with weight bearing component through L UE to improve function.   -Nustep hills to improve endurance with resistance level 3-6. X 7 minutes.   -Cone taps: 2x10 on even ground at balance station.   -x15 on airex pad.   -Stairs: 28x 2 in stairwell with reciprocal pattern and CGA.   -Farmers carry with 15 lb. KB in L hand x450'.   Unless otherwise stated, CGA was provided and gait belt donned in order to ensure pt safety   PATIENT EDUCATION: Education details: HEP, safety in home, stroke education. Benefits of NMR for stroke recovery.  Person educated: Patient and patient's friend Education method: Explanation, Demonstration, Verbal cues, and Handouts Education comprehension: verbalized understanding and returned  demonstration  HOME EXERCISE PROGRAM: Access Code: [email or web address] URL: [email or web address] Date: 07/16/2024 Prepared by: Massie Dollar  Exercises - Sit to Stand with Arms Crossed  - 1 x daily - 7 x weekly - 2 sets - 10 reps - Standing Tandem Balance with Counter Support  - 1 x daily - 7 x weekly - 1 sets - 3 reps - 30 second hold - Single Leg Stance  - 1 x daily - 7 x weekly - 1 sets - 3 reps - 20 second hold - Lateral Step Up with Counter Support  - 1 x daily - 7 x weekly - 2 sets - 10 reps - Forward Step Up with Counter Support  - 1 x daily - 7 x weekly - 2 sets - 10 reps - Forward Step Down Touch with Heel  - 1 x daily - 7 x weekly - 3 sets - 10 reps - 2 hold  GOALS: Goals reviewed with patient? Yes  SHORT TERM GOALS: Target date: 07/12/2024  09/21/2023 Baseline: Patient will be independent with HEP by discharge allowing him to perform exercises at home with decreased difficulty and improved safety.  Goal status: INITIAL    LONG TERM GOALS: Target date: 09/20/2024  1.  Patient (> 82 years old) will complete five times sit to stand test in < 15 seconds indicating an increased LE strength and improved balance. Baseline: 23.47 without AD. Goal status: INITIAL  2.  Patient will increase ABC Scale by 40% by discharge allowing him to ambulate and perform functional activities with decreased risk of falling.  Baseline: 22.5%. Goal status: INITIAL   3.  Patient will reduce timed up and go to <11 seconds to reduce fall risk and demonstrate improved transfer/gait ability. Baseline: 13.4'' without Ad. Goal status: INITIAL  4.  Patient will increase 10 meter walk test to >1.59m/s as to improve gait speed for better community ambulation and to reduce fall risk. Baseline: 0.77 meters/second Goal status: INITIAL  5.  Patient will increase six minute walk test distance to >1000 for progression to community ambulator and improve gait ability Baseline: 800 ft Goal status:  INITIAL    ASSESSMENT:  CLINICAL IMPRESSION:  Pt continued focus gait/balance along with functional use of the L UE. Sit to stands performed with L UE on arm rest to promote use and weight bearing through the L UE/hand. He also performed Nustep x7' with resistance for improved endurance. Gait with stair training performed today which patient performed well with, but did have SHOB. Patient finished treatment with farmers carry on L side to improve L UE grip/use  while also improving balance with gait. Decreased balance/coordination still noted with exercises such as cone taps. It was patient's request to put all rehab on hold and continue on his own x1 month at this time as he is doing the same with OT. Pt will continue on his own x 1 month then return for assessment.  OBJECTIVE IMPAIRMENTS: Abnormal gait, decreased activity tolerance, decreased balance, decreased coordination, decreased endurance, difficulty walking, decreased strength, impaired sensation, and impaired UE functional use.   ACTIVITY LIMITATIONS: carrying, lifting, bending, standing, squatting, stairs, transfers, and bed mobility  PARTICIPATION LIMITATIONS: meal prep, cleaning, laundry, driving, shopping, community activity, and yard work  PERSONAL FACTORS: 1-2 comorbidities: HTN, CHF, CAD, CVA are also affecting patient's functional outcome.   REHAB POTENTIAL: Good  CLINICAL DECISION MAKING: Stable/uncomplicated  EVALUATION COMPLEXITY: Moderate  PLAN:  PT FREQUENCY: 1-2x/week  PT DURATION: 12 weeks  PLANNED INTERVENTIONS: 97164- PT Re-evaluation, 97750- Physical Performance Testing, 97110-Therapeutic exercises, 97530- Therapeutic activity, [id number]- Neuromuscular re-education, 97535- Self Care, [id number]- Manual therapy, [phone]- Gait training, Patient/Family education, Balance training, Stair training, and Vestibular training  PLAN FOR NEXT SESSION:  - Re-assess patient upon return from short pause from rehab  services.   Norman KATHEE Sharps PT Physical Therapist- [place]  [facility]        "

## 2024-08-07 ENCOUNTER — Ambulatory Visit: Admitting: Family Medicine

## 2024-08-08 ENCOUNTER — Ambulatory Visit: Attending: Cardiovascular Disease | Admitting: Cardiovascular Disease

## 2024-08-08 ENCOUNTER — Encounter: Payer: Self-pay | Admitting: Cardiovascular Disease

## 2024-08-08 ENCOUNTER — Encounter: Payer: Self-pay | Admitting: Pharmacy Technician

## 2024-08-08 VITALS — BP 100/60 | HR 75 | Ht 67.0 in | Wt 187.0 lb

## 2024-08-08 DIAGNOSIS — I10 Essential (primary) hypertension: Secondary | ICD-10-CM | POA: Insufficient documentation

## 2024-08-08 DIAGNOSIS — E785 Hyperlipidemia, unspecified: Secondary | ICD-10-CM | POA: Insufficient documentation

## 2024-08-08 DIAGNOSIS — I5022 Chronic systolic (congestive) heart failure: Secondary | ICD-10-CM | POA: Insufficient documentation

## 2024-08-08 DIAGNOSIS — I2510 Atherosclerotic heart disease of native coronary artery without angina pectoris: Secondary | ICD-10-CM | POA: Diagnosis not present

## 2024-08-08 MED ORDER — TICAGRELOR 90 MG PO TABS: 90.0000 mg | ORAL_TABLET | Freq: Two times a day (BID) | ORAL | 11 refills | Status: AC

## 2024-08-08 NOTE — Patient Instructions (Addendum)
 Medication Instructions:  RESUME the Brilinta  90 mg twice daily  *If you need a refill on your cardiac medications before your next appointment, please call your pharmacy*  Lab Work: None ordered If you have labs (blood work) drawn today and your tests are completely normal, you will receive your results only by: MyChart Message (if you have MyChart) OR A paper copy in the mail If you have any lab test that is abnormal or we need to change your treatment, we will call you to review the results.  Testing/Procedures: None ordered  Follow-Up: At [facility], you and your health needs are our priority.  As part of our continuing mission to provide you with exceptional heart care, our providers are all part of one team.  This team includes your primary Cardiologist (physician) and Advanced Practice Providers or APPs (Physician Assistants and Nurse Practitioners) who all work together to provide you with the care you need, when you need it.  Your next appointment:   6 month(s)  Provider:   You may see Deatrice Cage, MD or one of the following Advanced Practice Providers on your designated Care Team:   Lonni Meager, NP Lesley Maffucci, PA-C Bernardino Bring, PA-C Cadence [place], PA-C Tylene Lunch, NP Barnie Hila, NP    We recommend signing up for the patient portal called MyChart.  Sign up information is provided on this After Visit Summary.  MyChart is used to connect with patients for Virtual Visits (Telemedicine).  Patients are able to view lab/test results, encounter notes, upcoming appointments, etc.  Non-urgent messages can be sent to your provider as well.   To learn more about what you can do with MyChart, go to [email or web address].

## 2024-08-08 NOTE — Telephone Encounter (Signed)
 Cmm was archived by [facility]

## 2024-08-08 NOTE — Progress Notes (Signed)
 "    Cardiology Office Note   Date:  08/08/2024   ID:  [name], DOB September 06, 1960, MRN [id number]  PCP:  Aletha Bene, MD  Cardiologist:   Deatrice Cage, MD   Chief Complaint  Patient presents with   Follow-up    4 Month f/u pt would like to discuss medications/blood thinner and CVA in December. Meds reviewed verbally with pt.      History of Present Illness: [name] is a 64 y.o. male who presents for for follow-up visit regarding coronary artery disease and chronic systolic heart failure due to ischemic cardiomyopathy.  He has known history of diabetes mellitus, essential hypertension, CVA ,hyperlipidemia and previous tobacco use. He presented in April of 2021 with anterior ST elevation myocardial infarction.  Emergent cardiac catheterization showed occluded proximal LAD which was treated successfully with PCI and drug-eluting stent placement.  Echocardiogram showed an EF of 35 to 40%.   He quit smoking since his cardiac event. He had myalgia with atorvastatin  but improved after switching to rosuvastatin .  He was hospitalized in April 2022 with acute CVA in the setting of noncompliance with antihypertensive medications.  Imaging showed left posterior thalamic punctuate infarct.  He had a loop recorder inserted.  Transesophageal echocardiogram showed no cardiac source of embolism.  EF was 55 to 60%.  He had recurrent angina in early 2025.  Echocardiogram showed an EF of 40 to 45%.  Cardiac catheterization showed significant in-stent restenosis in the LAD involving a large diagonal branch.  He subsequently underwent two-vessel CABG.  He was hospitalized in December with another stroke manifested as left-sided weakness.  MRI showed multiple small acute ischemic infarcts within the right hemisphere.  He was placed on Brilinta  for 21 days.  He has a loop recorder in place with no evidence of atrial fibrillation so far.  Past Medical History:  Diagnosis Date   Anemia 10/03/2009    Qualifier: Diagnosis of  By: Georgian ROSALEA CHARM Lamar    Anxiety associated with depression 10/11/2013   pt states he is no longer having this issue because he is happy with his job now   Colon cancer [facility])    right colon cancer- adenocarcinoma CEA level isnrmal at 1.8   Coronary artery disease    Diabetes mellitus    Diabetes mellitus type 2 in obese 03/20/2010   Qualifier: Diagnosis of  By: Georgian ROSALEA CHARM Lamar     Erectile dysfunction 06/22/2016   Essential hypertension 06/05/2014   FATTY LIVER DISEASE 11/04/2009   Qualifier: Diagnosis of  By: Abran MD, Norleen SAILOR   Qualifier: Diagnosis of  By: Abran MD, Norleen SAILOR  Last Assessment & Plan:  conirmed by CT scan of abdomen in April 2016 encouraged to minimize simple carbs and fatty foods.   GERD 11/04/2009   Qualifier: Diagnosis of  By: Abran MD, Norleen SAILOR    Great toe pain, right 04/18/2017   Hepatic artery stenosis    History of colon cancer 01/02/2010   Qualifier: Diagnosis of  By: Georgian ROSALEA CHARM Lamar Dr Abran  Last Assessment & Plan:  Follows closely with gastroenterology, Dr Abran   Hyperlipidemia    Hyperlipidemia, mixed 09/07/2010   Qualifier: Diagnosis of  By: Georgian ROSALEA CHARM Lamar    Hypertension    Hypertriglyceridemia 12/14/2010   Hypotestosteronism 06/22/2016   INSOMNIA, CHRONIC 03/20/2010   Qualifier: Diagnosis of  By: Georgian ROSALEA CHARM Lamar Ambien  10 mg daily does not keep asleep AdvilPM is over sedating and has trouble  waking up    Iron  deficiency anemia 2011   Low testosterone  06/22/2016   Malignant neoplasm of colon (HCC) 01/02/2010   Qualifier: Diagnosis of  By: Georgian ROSALEA CHARM Lamar Dr Abran    Muscle spasm of back 06/05/2014   Nipple pain 09/30/2016   Obesity    Palpitations 06/05/2014   Preventative health care 10/08/2012   Psoriasis    Psoriatic arthritis (HCC) 08/25/2017   Right knee pain 11/17/2011   Stroke [facility])     Past Surgical History:  Procedure Laterality Date   BUBBLE STUDY  10/21/2020   Procedure: BUBBLE STUDY;  Surgeon:  Mona Vinie BROCKS, MD;  Location: [facility] ENDOSCOPY;  Service: Cardiovascular;;   CARPAL TUNNEL RELEASE     RIGHT   CHOLECYSTECTOMY  1994   COLONOSCOPY     CORONARY ARTERY BYPASS GRAFT N/A 09/19/2023   Procedure: CORONARY ARTERY BYPASS GRAFTING (CABG) TIMES TWO USING LEFT INTERNAL MAMMARY ARTERY AND ENDOSCOPICALLY HARVESTED RIGHT GREATER SAPHENOUS VEIN;  Surgeon: Lucas Dorise POUR, MD;  Location: [facility] OR;  Service: Open Heart Surgery;  Laterality: N/A;   CORONARY/GRAFT ACUTE MI REVASCULARIZATION N/A 10/27/2019   Procedure: Coronary/Graft Acute MI Revascularization;  Surgeon: Darron Deatrice LABOR, MD;  Location: [facility] INVASIVE CV LAB;  Service: Cardiovascular;  Laterality: N/A;   HEMICOLECTOMY     12/17/2009 right   LEFT HEART CATH AND CORONARY ANGIOGRAPHY N/A 10/27/2019   Procedure: LEFT HEART CATH AND CORONARY ANGIOGRAPHY;  Surgeon: Darron Deatrice LABOR, MD;  Location: [facility] INVASIVE CV LAB;  Service: Cardiovascular;  Laterality: N/A;   LOOP RECORDER INSERTION N/A 10/21/2020   Procedure: LOOP RECORDER INSERTION;  Surgeon: Kelsie Agent, MD;  Location: [facility] INVASIVE CV LAB;  Service: Cardiovascular;  Laterality: N/A;   POLYPECTOMY     RIGHT/LEFT HEART CATH AND CORONARY ANGIOGRAPHY Bilateral 08/12/2023   Procedure: RIGHT/LEFT HEART CATH AND CORONARY ANGIOGRAPHY;  Surgeon: Darron Deatrice LABOR, MD;  Location: [facility] INVASIVE CV LAB;  Service: Cardiovascular;  Laterality: Bilateral;   TEE WITHOUT CARDIOVERSION N/A 10/21/2020   Procedure: TRANSESOPHAGEAL ECHOCARDIOGRAM (TEE);  Surgeon: Mona Vinie BROCKS, MD;  Location: [facility] ENDOSCOPY;  Service: Cardiovascular;  Laterality: N/A;   TEE WITHOUT CARDIOVERSION N/A 09/19/2023   Procedure: TRANSESOPHAGEAL ECHOCARDIOGRAM (TEE);  Surgeon: Lucas Dorise POUR, MD;  Location[facility] OR;  Service: Open Heart Surgery;  Laterality: N/A;     Current Outpatient Medications  Medication Sig Dispense Refill   aspirin  EC 81 MG tablet Take 1 tablet (81 mg total) by mouth daily. Swallow whole. 30 tablet 12    Insulin  Glargine (BASAGLAR  KWIKPEN) 100 UNIT/ML Inject 5 Units into the skin at bedtime. Titrate to Fasting Blood Sugar of 100- 150 mg/dL with morning blood sugar check. (Patient taking differently: Inject 5 Units into the skin as needed. Titrate to Fasting Blood Sugar of 100- 150 mg/dL with morning blood sugar check.) 15 mL 3   insulin  lispro (HUMALOG ) 100 UNIT/ML KwikPen Only take 3 units with meals if BG is greater than 250 up to three times daily (Patient taking differently: as needed. Only take 3 units with meals if BG is greater than 250 up to three times daily) 15 mL 5   metFORMIN  (GLUCOPHAGE -XR) 750 MG 24 hr tablet Take 1 tablet (750 mg total) by mouth 2 (two) times daily after a meal. (Patient taking differently: Take 750 mg by mouth as needed.) 180 tablet 3   OZEMPIC , 2 MG/DOSE, 8 MG/3ML SOPN INJECT 2 MG UNDER THE SKIN ONE DAY A WEEK AS DIRECTED. 3 mL 11  Adalimumab-ryvk, 2 Pen, 40 MG/0.4ML AJKT Inject into the skin. (Patient not taking: Reported on 08/08/2024)     Blood Glucose Monitoring Suppl (CONTOUR NEXT EZ) w/Device KIT 1 each by Does not apply route as directed. (Patient not taking: Reported on 08/08/2024) 1 kit 1   busPIRone  (BUSPAR ) 7.5 MG tablet Take 1 tablet (7.5 mg total) by mouth 2 (two) times daily. (Patient not taking: Reported on 08/08/2024) 60 tablet 0   Continuous Glucose Sensor (FREESTYLE LIBRE 3 PLUS SENSOR) MISC Change sensor every 15 days. (Patient not taking: Reported on 08/08/2024) 2 each 6   glucose blood (CONTOUR NEXT TEST) test strip 1 each by Other route 4 (four) times daily - after meals and at bedtime. Use as instructed (Patient not taking: Reported on 08/08/2024) 100 each 12   mirtazapine  (REMERON ) 15 MG tablet Take 1 tablet (15 mg total) by mouth at bedtime. For depression (Patient not taking: Reported on 08/08/2024) 90 tablet 1   QUEtiapine  (SEROQUEL ) 100 MG tablet TAKE 1 TABLET(100 MG) BY MOUTH AT BEDTIME (Patient not taking: Reported on 08/08/2024) 90 tablet 1    rosuvastatin  (CRESTOR ) 20 MG tablet Take 1 tablet (20 mg total) by mouth every evening. (Patient not taking: Reported on 08/08/2024) 90 tablet 3   No current facility-administered medications for this visit.    Allergies:   Clopidogrel     Social History:  The patient  reports that he quit smoking about 12 months ago. His smoking use included cigarettes. He has never used smokeless tobacco. He reports that he does not drink alcohol and does not use drugs.   Family History:  The patient's family history includes Alcohol abuse in his brother; Diabetes in his brother; Diabetes type II in his brother; Stomach cancer in his mother.    ROS:  Please see the history of present illness.   Otherwise, review of systems are positive for none.   All other systems are reviewed and negative.    PHYSICAL EXAM: VS:  BP 100/60 (BP Location: Left Arm, Patient Position: Sitting, Cuff Size: Large)   Pulse 75   Ht 5' 7 (1.702 m)   Wt 187 lb (84.8 kg)   SpO2 97%   BMI 29.29 kg/m  , BMI Body mass index is 29.29 kg/m. GEN: Well nourished, well developed, in no acute distress  HEENT: normal  Neck: no JVD, carotid bruits, or masses Cardiac: RRR; no murmurs, rubs, or gallops,no edema  Respiratory:  clear to auscultation bilaterally, normal work of breathing GI: soft, nontender, nondistended, + BS MS: no deformity or atrophy  Skin: warm and dry, no rash Neuro:  Strength and sensation are intact Psych: euthymic mood, full affect    EKG:  EKG is ordered today. The ekg ordered today demonstrates: Normal sinus rhythm Anteroseptal infarct , age undetermined      Recent Labs: 09/20/2023: Magnesium  2.2 03/21/2024: TSH 2.18 06/21/2024: ALT 21; BUN 10; Creatinine, Ser 1.14; Hemoglobin 14.9; Platelets 279; Potassium 3.3; Sodium 140    Lipid Panel    Component Value Date/Time   CHOL 162 06/21/2024 1530   CHOL 111 05/20/2023 1040   CHOL 949 (H) 10/27/2014 1322   TRIG 108 06/21/2024 1530   TRIG 1,098 (H)  10/30/2014 0605   HDL 45 06/21/2024 1530   HDL 39 (L) 05/20/2023 1040   HDL 20 (L) 10/27/2014 1322   CHOLHDL 3.6 06/21/2024 1530   VLDL 22 06/21/2024 1530   VLDL SEE COMMENT 10/27/2014 1322   LDLCALC 96 06/21/2024 1530  LDLCALC 66 03/21/2024 1105   LDLCALC SEE COMMENT 10/27/2014 1322   LDLDIRECT 76.0 03/13/2021 1317      Wt Readings from Last 3 Encounters:  08/08/24 187 lb (84.8 kg)  07/24/24 185 lb 6 oz (84.1 kg)  06/26/24 180 lb (81.6 kg)           12/07/2017    8:16 AM  PAD Screen  Previous PAD dx? No  Previous surgical procedure? No  Pain with walking? No  Feet/toe relief with dangling? Yes  Painful, non-healing ulcers? No  Extremities discolored? No      ASSESSMENT AND PLAN:  1.  Coronary artery disease involving native coronary arteries without angina: Continue medical therapy  2.  Chronic systolic heart failure due to ischemic cardiomyopathy: Most recent echo in December showed an EF of 45 to 50%.  He is off all antihypertensive medications due to low blood pressure after weight loss.    3.  Essential hypertension: He has not been taking metoprolol , lisinopril  or Imdur  and his blood pressure is on the low side.  This is likely due to continued weight loss.  We will not resume at this time.  4.  Hyperlipidemia: Continue treatment with rosuvastatin .  Most recent lipid profile showed an LDL of 66.  5.  Recurrent ischemic strokes: He does have a loop recorder in place with no evidence of atrial fibrillation to require anticoagulation.  We had a prolonged discussion about the indications for long-term dual antiplatelet therapy.  Given recurrent ischemic strokes of unclear etiology as well as known history of coronary artery disease post revascularization, he might benefit from long-term dual antiplatelet therapy and this is his preference anyway.  Thus, we will resume ticagrelor  90 mg twice daily and continue low-dose aspirin  for now but we can likely discontinue  aspirin  in the future if he remains on ticagrelor . If we ultimately diagnose him with atrial fibrillation, he will require anticoagulation instead.   Disposition:   FU with me in 6 months.  Signed,  Deatrice Cage, MD  08/08/2024 10:58 AM     [facility] "

## 2024-08-15 ENCOUNTER — Ambulatory Visit: Admitting: Physical Therapy

## 2024-08-15 ENCOUNTER — Telehealth: Payer: Self-pay | Admitting: Adult Health

## 2024-08-15 ENCOUNTER — Ambulatory Visit: Admitting: Occupational Therapy

## 2024-08-15 NOTE — Telephone Encounter (Signed)
 Myc  cxl

## 2024-08-24 ENCOUNTER — Other Ambulatory Visit (HOSPITAL_COMMUNITY): Payer: Self-pay

## 2024-08-31 ENCOUNTER — Ambulatory Visit

## 2024-09-04 ENCOUNTER — Ambulatory Visit

## 2024-09-04 ENCOUNTER — Ambulatory Visit: Admitting: Occupational Therapy

## 2024-09-05 ENCOUNTER — Ambulatory Visit: Admitting: Cardiovascular Disease

## 2024-09-06 ENCOUNTER — Ambulatory Visit

## 2024-09-06 ENCOUNTER — Ambulatory Visit: Admitting: Occupational Therapy

## 2024-09-10 ENCOUNTER — Ambulatory Visit

## 2024-09-10 ENCOUNTER — Ambulatory Visit: Admitting: Occupational Therapy

## 2024-09-13 ENCOUNTER — Ambulatory Visit: Admitting: Physical Therapy

## 2024-09-13 ENCOUNTER — Ambulatory Visit

## 2024-09-20 ENCOUNTER — Ambulatory Visit: Admitting: Physical Therapy

## 2024-09-20 ENCOUNTER — Ambulatory Visit: Admitting: Occupational Therapy

## 2024-10-01 ENCOUNTER — Ambulatory Visit

## 2024-11-01 ENCOUNTER — Ambulatory Visit

## 2024-12-21 ENCOUNTER — Ambulatory Visit: Admitting: Adult Health

## 2025-01-14 ENCOUNTER — Ambulatory Visit: Admitting: Adult Health

## 2025-01-21 ENCOUNTER — Ambulatory Visit: Admitting: Family Medicine
